# Patient Record
Sex: Female | Born: 1945
Health system: Southern US, Community
[De-identification: ages and names within clinical notes are randomized; demographics above are authoritative.]

## PROBLEM LIST (undated history)

## (undated) DIAGNOSIS — K760 Fatty (change of) liver, not elsewhere classified: Secondary | ICD-10-CM

## (undated) DIAGNOSIS — Z8719 Personal history of other diseases of the digestive system: Secondary | ICD-10-CM

## (undated) DIAGNOSIS — C801 Malignant (primary) neoplasm, unspecified: Secondary | ICD-10-CM

## (undated) DIAGNOSIS — I8390 Asymptomatic varicose veins of unspecified lower extremity: Secondary | ICD-10-CM

## (undated) DIAGNOSIS — Z8709 Personal history of other diseases of the respiratory system: Secondary | ICD-10-CM

## (undated) DIAGNOSIS — E119 Type 2 diabetes mellitus without complications: Secondary | ICD-10-CM

## (undated) DIAGNOSIS — I10 Essential (primary) hypertension: Secondary | ICD-10-CM

## (undated) DIAGNOSIS — F329 Major depressive disorder, single episode, unspecified: Secondary | ICD-10-CM

## (undated) DIAGNOSIS — K2270 Barrett's esophagus without dysplasia: Secondary | ICD-10-CM

## (undated) DIAGNOSIS — F419 Anxiety disorder, unspecified: Secondary | ICD-10-CM

## (undated) DIAGNOSIS — K219 Gastro-esophageal reflux disease without esophagitis: Secondary | ICD-10-CM

## (undated) DIAGNOSIS — I872 Venous insufficiency (chronic) (peripheral): Secondary | ICD-10-CM

## (undated) DIAGNOSIS — E785 Hyperlipidemia, unspecified: Secondary | ICD-10-CM

## (undated) DIAGNOSIS — T884XXA Failed or difficult intubation, initial encounter: Secondary | ICD-10-CM

## (undated) DIAGNOSIS — R32 Unspecified urinary incontinence: Secondary | ICD-10-CM

## (undated) DIAGNOSIS — N189 Chronic kidney disease, unspecified: Secondary | ICD-10-CM

## (undated) DIAGNOSIS — K581 Irritable bowel syndrome with constipation: Secondary | ICD-10-CM

## (undated) DIAGNOSIS — T7840XA Allergy, unspecified, initial encounter: Secondary | ICD-10-CM

## (undated) DIAGNOSIS — F32A Depression, unspecified: Secondary | ICD-10-CM

## (undated) DIAGNOSIS — M1990 Unspecified osteoarthritis, unspecified site: Secondary | ICD-10-CM

## (undated) DIAGNOSIS — G4730 Sleep apnea, unspecified: Secondary | ICD-10-CM

## (undated) DIAGNOSIS — K5790 Diverticulosis of intestine, part unspecified, without perforation or abscess without bleeding: Secondary | ICD-10-CM

## (undated) HISTORY — PX: CARDIAC CATHETERIZATION: SHX172

## (undated) HISTORY — DX: Fatty (change of) liver, not elsewhere classified: K76.0

## (undated) HISTORY — DX: Allergy, unspecified, initial encounter: T78.40XA

## (undated) HISTORY — DX: Asymptomatic varicose veins of unspecified lower extremity: I83.90

## (undated) HISTORY — PX: TONSILLECTOMY: SUR1361

## (undated) HISTORY — DX: Depression, unspecified: F32.A

## (undated) HISTORY — PX: CHOLECYSTECTOMY: SHX55

## (undated) HISTORY — DX: Major depressive disorder, single episode, unspecified: F32.9

## (undated) HISTORY — PX: ROTATOR CUFF REPAIR: SHX139

## (undated) HISTORY — DX: Unspecified osteoarthritis, unspecified site: M19.90

## (undated) HISTORY — PX: UPPER GI ENDOSCOPY: SHX6162

## (undated) HISTORY — DX: Hyperlipidemia, unspecified: E78.5

## (undated) HISTORY — DX: Type 2 diabetes mellitus without complications: E11.9

## (undated) HISTORY — PX: TUBAL LIGATION: SHX77

## (undated) HISTORY — DX: Essential (primary) hypertension: I10

## (undated) HISTORY — DX: Gastro-esophageal reflux disease without esophagitis: K21.9

## (undated) HISTORY — DX: Unspecified urinary incontinence: R32

## (undated) HISTORY — PX: ABDOMINAL HYSTERECTOMY: SHX81

---

## 2002-09-26 HISTORY — PX: JOINT REPLACEMENT: SHX530

## 2002-11-05 ENCOUNTER — Encounter: Admission: RE | Admit: 2002-11-05 | Discharge: 2003-02-03 | Payer: Self-pay | Admitting: Endocrinology

## 2003-02-25 ENCOUNTER — Encounter: Admission: RE | Admit: 2003-02-25 | Discharge: 2003-05-26 | Payer: Self-pay | Admitting: Endocrinology

## 2003-05-07 ENCOUNTER — Encounter (HOSPITAL_BASED_OUTPATIENT_CLINIC_OR_DEPARTMENT_OTHER): Admission: RE | Admit: 2003-05-07 | Discharge: 2003-06-03 | Payer: Self-pay | Admitting: Internal Medicine

## 2003-09-09 ENCOUNTER — Inpatient Hospital Stay (HOSPITAL_COMMUNITY): Admission: RE | Admit: 2003-09-09 | Discharge: 2003-09-13 | Payer: Self-pay | Admitting: Orthopedic Surgery

## 2003-09-26 ENCOUNTER — Encounter (HOSPITAL_BASED_OUTPATIENT_CLINIC_OR_DEPARTMENT_OTHER): Admission: RE | Admit: 2003-09-26 | Discharge: 2003-11-07 | Payer: Self-pay | Admitting: Internal Medicine

## 2003-12-08 ENCOUNTER — Encounter: Admission: RE | Admit: 2003-12-08 | Discharge: 2004-03-07 | Payer: Self-pay | Admitting: Cardiology

## 2004-01-23 ENCOUNTER — Encounter (HOSPITAL_BASED_OUTPATIENT_CLINIC_OR_DEPARTMENT_OTHER): Admission: RE | Admit: 2004-01-23 | Discharge: 2004-02-05 | Payer: Self-pay | Admitting: Internal Medicine

## 2004-10-05 ENCOUNTER — Ambulatory Visit: Payer: Self-pay | Admitting: Family Medicine

## 2005-02-14 ENCOUNTER — Ambulatory Visit: Payer: Self-pay | Admitting: Family Medicine

## 2005-09-26 HISTORY — PX: JOINT REPLACEMENT: SHX530

## 2005-09-29 ENCOUNTER — Inpatient Hospital Stay (HOSPITAL_COMMUNITY): Admission: RE | Admit: 2005-09-29 | Discharge: 2005-10-03 | Payer: Self-pay | Admitting: Orthopedic Surgery

## 2005-10-19 ENCOUNTER — Encounter: Payer: Self-pay | Admitting: Orthopedic Surgery

## 2005-10-21 ENCOUNTER — Ambulatory Visit: Payer: Self-pay | Admitting: Family Medicine

## 2005-10-25 ENCOUNTER — Ambulatory Visit: Payer: Self-pay | Admitting: Family Medicine

## 2005-10-27 ENCOUNTER — Encounter: Payer: Self-pay | Admitting: Orthopedic Surgery

## 2005-10-31 ENCOUNTER — Ambulatory Visit: Payer: Self-pay | Admitting: Family Medicine

## 2005-11-24 ENCOUNTER — Encounter: Payer: Self-pay | Admitting: Orthopedic Surgery

## 2006-01-20 ENCOUNTER — Encounter: Admission: RE | Admit: 2006-01-20 | Discharge: 2006-01-20 | Payer: Self-pay | Admitting: Orthopedic Surgery

## 2006-02-01 ENCOUNTER — Ambulatory Visit: Payer: Self-pay | Admitting: Family Medicine

## 2006-05-31 ENCOUNTER — Encounter: Payer: Self-pay | Admitting: Orthopedic Surgery

## 2006-06-12 ENCOUNTER — Ambulatory Visit: Payer: Self-pay | Admitting: Gastroenterology

## 2006-06-26 ENCOUNTER — Encounter: Payer: Self-pay | Admitting: Orthopedic Surgery

## 2006-07-27 ENCOUNTER — Encounter: Payer: Self-pay | Admitting: Orthopedic Surgery

## 2007-11-29 ENCOUNTER — Telehealth: Payer: Self-pay | Admitting: Family Medicine

## 2007-11-29 ENCOUNTER — Telehealth (INDEPENDENT_AMBULATORY_CARE_PROVIDER_SITE_OTHER): Payer: Self-pay | Admitting: *Deleted

## 2008-05-07 ENCOUNTER — Encounter: Payer: Self-pay | Admitting: Urology

## 2008-05-27 ENCOUNTER — Encounter: Payer: Self-pay | Admitting: Urology

## 2008-06-26 ENCOUNTER — Encounter: Payer: Self-pay | Admitting: Urology

## 2008-07-27 ENCOUNTER — Encounter: Payer: Self-pay | Admitting: Urology

## 2009-01-21 ENCOUNTER — Inpatient Hospital Stay (HOSPITAL_COMMUNITY): Admission: EM | Admit: 2009-01-21 | Discharge: 2009-01-24 | Payer: Self-pay | Admitting: Emergency Medicine

## 2009-01-29 ENCOUNTER — Encounter: Payer: Self-pay | Admitting: Critical Care Medicine

## 2009-01-29 ENCOUNTER — Ambulatory Visit (HOSPITAL_COMMUNITY): Admission: RE | Admit: 2009-01-29 | Discharge: 2009-01-29 | Payer: Self-pay | Admitting: Critical Care Medicine

## 2009-01-29 ENCOUNTER — Ambulatory Visit: Payer: Self-pay | Admitting: Surgery

## 2009-02-26 ENCOUNTER — Ambulatory Visit: Payer: Self-pay | Admitting: Vascular Surgery

## 2009-02-26 ENCOUNTER — Ambulatory Visit (HOSPITAL_COMMUNITY): Admission: RE | Admit: 2009-02-26 | Discharge: 2009-02-26 | Payer: Self-pay | Admitting: Critical Care Medicine

## 2009-02-26 ENCOUNTER — Encounter: Payer: Self-pay | Admitting: Critical Care Medicine

## 2010-03-04 ENCOUNTER — Emergency Department: Payer: Self-pay | Admitting: Emergency Medicine

## 2010-10-26 NOTE — Progress Notes (Signed)
Summary: med update  Medications Added LISINOPRIL 20 MG TABS (LISINOPRIL)  NEXIUM 40 MG CPDR (ESOMEPRAZOLE MAGNESIUM)  ACTOS 30 MG TABS (PIOGLITAZONE HCL)  HYDROCHLOROTHIAZIDE 25 MG TABS (HYDROCHLOROTHIAZIDE)  GLUCOTROL XL 10 MG TB24 (GLIPIZIDE)  ALLEGRA 180 MG TABS (FEXOFENADINE HCL)  FLONASE 50 MCG/ACT SUSP (FLUTICASONE PROPIONATE)  GLUCOPHAGE XR 500 MG TB24 (METFORMIN HCL)             New/Updated Medications: LISINOPRIL 20 MG TABS (LISINOPRIL)  NEXIUM 40 MG CPDR (ESOMEPRAZOLE MAGNESIUM)  ACTOS 30 MG TABS (PIOGLITAZONE HCL)  HYDROCHLOROTHIAZIDE 25 MG TABS (HYDROCHLOROTHIAZIDE)  GLUCOTROL XL 10 MG TB24 (GLIPIZIDE)  ALLEGRA 180 MG TABS (FEXOFENADINE HCL)  FLONASE 50 MCG/ACT SUSP (FLUTICASONE PROPIONATE)  GLUCOPHAGE XR 500 MG TB24 (METFORMIN HCL)

## 2010-10-26 NOTE — Progress Notes (Signed)
Summary: calan  Phone Note From Pharmacy   Call For: dr Marshawn Ninneman  Summary of Call: this pt has not seen you since 5/07, I have sent messages for the past 2 months to the pharmacists along with electronic refill responses that pt needs office visit before further refills but they have sent another electronic request for calan Initial call taken by: Lowella Petties,  November 29, 2007 4:35 PM  Follow-up for Phone Call        please call pt and get follow up appt scheduled then can refil 1 month with 1 refil Follow-up by: Judith Part MD,  November 29, 2007 5:01 PM  Additional Follow-up for Phone Call Additional follow up Details #1::        advised pharmacist that pt needs office visit and I cant get in touch with her to tell her because her phone doesnt work, pharmacist will advise her to call the office Additional Follow-up by: Lowella Petties,  November 29, 2007 5:09 PM

## 2011-01-04 LAB — GLUCOSE, CAPILLARY
Glucose-Capillary: 152 mg/dL — ABNORMAL HIGH (ref 70–99)
Glucose-Capillary: 172 mg/dL — ABNORMAL HIGH (ref 70–99)

## 2011-01-05 LAB — COMPREHENSIVE METABOLIC PANEL
ALT: 19 U/L (ref 0–35)
ALT: 20 U/L (ref 0–35)
AST: 21 U/L (ref 0–37)
AST: 22 U/L (ref 0–37)
Albumin: 3.3 g/dL — ABNORMAL LOW (ref 3.5–5.2)
Albumin: 3.7 g/dL (ref 3.5–5.2)
Alkaline Phosphatase: 65 U/L (ref 39–117)
Alkaline Phosphatase: 77 U/L (ref 39–117)
BUN: 12 mg/dL (ref 6–23)
BUN: 19 mg/dL (ref 6–23)
CO2: 26 mEq/L (ref 19–32)
CO2: 27 mEq/L (ref 19–32)
Calcium: 8.9 mg/dL (ref 8.4–10.5)
Calcium: 8.9 mg/dL (ref 8.4–10.5)
Chloride: 105 mEq/L (ref 96–112)
Chloride: 99 mEq/L (ref 96–112)
Creatinine, Ser: 0.8 mg/dL (ref 0.4–1.2)
Creatinine, Ser: 0.94 mg/dL (ref 0.4–1.2)
GFR calc Af Amer: >60 mL/min (ref >60)
GFR calc Af Amer: >60 mL/min (ref >60)
GFR calc non Af Amer: 60 mL/min — ABNORMAL LOW (ref >60)
GFR calc non Af Amer: >60 mL/min (ref >60)
Glucose, Bld: 151 mg/dL — ABNORMAL HIGH (ref 70–99)
Glucose, Bld: 189 mg/dL — ABNORMAL HIGH (ref 70–99)
Potassium: 3.7 mEq/L (ref 3.5–5.1)
Potassium: 3.7 mEq/L (ref 3.5–5.1)
Sodium: 134 mEq/L — ABNORMAL LOW (ref 135–145)
Sodium: 141 mEq/L (ref 135–145)
Total Bilirubin: 0.6 mg/dL (ref 0.3–1.2)
Total Bilirubin: 0.7 mg/dL (ref 0.3–1.2)
Total Protein: 6.3 g/dL (ref 6.0–8.3)
Total Protein: 7.1 g/dL (ref 6.0–8.3)

## 2011-01-05 LAB — CBC
HCT: 33.3 % — ABNORMAL LOW (ref 36.0–46.0)
HCT: 36.4 % (ref 36.0–46.0)
Hemoglobin: 11.6 g/dL — ABNORMAL LOW (ref 12.0–15.0)
Hemoglobin: 12.6 g/dL (ref 12.0–15.0)
MCHC: 34.8 g/dL (ref 30.0–36.0)
MCHC: 34.8 g/dL (ref 30.0–36.0)
MCV: 91 fL (ref 78.0–100.0)
MCV: 91.1 fL (ref 78.0–100.0)
Platelets: 225 10*3/uL (ref 150–400)
Platelets: 283 10*3/uL (ref 150–400)
RBC: 3.65 MIL/uL — ABNORMAL LOW (ref 3.87–5.11)
RBC: 4 MIL/uL (ref 3.87–5.11)
RDW: 14.6 % (ref 11.5–15.5)
RDW: 14.7 % (ref 11.5–15.5)
WBC: 3.7 10*3/uL — ABNORMAL LOW (ref 4.0–10.5)
WBC: 6.1 10*3/uL (ref 4.0–10.5)

## 2011-01-05 LAB — POCT I-STAT, CHEM 8
BUN: 31 mg/dL — ABNORMAL HIGH (ref 6–23)
Calcium, Ion: 1.17 mmol/L (ref 1.12–1.32)
Chloride: 101 mEq/L (ref 96–112)
Creatinine, Ser: 0.9 mg/dL (ref 0.4–1.2)
Glucose, Bld: 88 mg/dL (ref 70–99)
HCT: 39 % (ref 36.0–46.0)
Hemoglobin: 13.3 g/dL (ref 12.0–15.0)
Potassium: 3.8 mEq/L (ref 3.5–5.1)
Sodium: 136 mEq/L (ref 135–145)
TCO2: 28 mmol/L (ref 0–100)

## 2011-01-05 LAB — GLUCOSE, CAPILLARY
Glucose-Capillary: 101 mg/dL — ABNORMAL HIGH (ref 70–99)
Glucose-Capillary: 111 mg/dL — ABNORMAL HIGH (ref 70–99)
Glucose-Capillary: 119 mg/dL — ABNORMAL HIGH (ref 70–99)
Glucose-Capillary: 140 mg/dL — ABNORMAL HIGH (ref 70–99)
Glucose-Capillary: 144 mg/dL — ABNORMAL HIGH (ref 70–99)
Glucose-Capillary: 146 mg/dL — ABNORMAL HIGH (ref 70–99)
Glucose-Capillary: 150 mg/dL — ABNORMAL HIGH (ref 70–99)
Glucose-Capillary: 83 mg/dL (ref 70–99)

## 2011-01-05 LAB — PROTIME-INR
INR: 1.1 (ref 0.00–1.49)
Prothrombin Time: 14.1 seconds (ref 11.6–15.2)

## 2011-01-05 LAB — DIFFERENTIAL
Basophils Absolute: 0 10*3/uL (ref 0.0–0.1)
Basophils Relative: 0 % (ref 0–1)
Eosinophils Absolute: 0.1 10*3/uL (ref 0.0–0.7)
Eosinophils Relative: 2 % (ref 0–5)
Lymphocytes Relative: 18 % (ref 12–46)
Lymphs Abs: 1.1 10*3/uL (ref 0.7–4.0)
Monocytes Absolute: 0.4 10*3/uL (ref 0.1–1.0)
Monocytes Relative: 7 % (ref 3–12)
Neutro Abs: 4.4 10*3/uL (ref 1.7–7.7)
Neutrophils Relative %: 73 % (ref 43–77)

## 2011-01-05 LAB — CULTURE, BLOOD (ROUTINE X 2)
Culture: NO GROWTH
Culture: NO GROWTH

## 2011-01-05 LAB — HEMOGLOBIN A1C
Hgb A1c MFr Bld: 6.9 % — ABNORMAL HIGH (ref 4.6–6.1)
Mean Plasma Glucose: 151 mg/dL

## 2011-02-08 NOTE — Discharge Summary (Signed)
NAMESARALEE, BOLICK             ACCOUNT NO.:  [id number]   MEDICAL RECORD NO.:  [id number]          PATIENT TYPE:  INP   LOCATION:  5504                         FACILITY:  [facility]   PHYSICIAN:  Michelene Gardener, MD    DATE OF BIRTH:  May 28, 1946   DATE OF ADMISSION:  01/21/2009  DATE OF DISCHARGE:  01/24/2009                               DISCHARGE SUMMARY   DISCHARGE DIAGNOSES:  1. Right lower extremity cellulitis.  2. Diabetes mellitus type 2 with hemoglobin A1c 6.9 in this admission.  3. Hypertension.  4. Hyperlipidemia.  5. History of diverticulitis.  6. History of osteoarthritis with bilateral knee replacement.  7. Depression.  8. Seasonal allergies.   DISCHARGE MEDICATIONS:  1. Clindamycin 600 mg p.o. 3 times daily for 2 weeks.  2. Lasix 40 mg p.o. once daily as needed and that has been prescribed      by primary physician.  3. Hydrochlorothiazide 12.5 mg once a day.  4. Lipitor 20 mg once a day.  5. Glipizide ER 10 mg once a day.  6. Acid controller 20 mg p.o. once daily.  7. Levemir 180 mg once a day.  8. Meloxicam 7.5 mg once a day.  9. Lisinopril 20 mg once a day.  10.Flonase inhaler 1 spray in each nostril once a day.  11.ProAir inhaler 1 spray in each nostril once a day as needed.  12.Actos 30 mg p.o. once daily.  13.Metformin ER 1000 mg p.o. twice daily.  14.Astepro spray 1 spray inhaled twice daily.  15.Meclizine 25 mg p.o. q.8 h. as needed.  16.Sertraline 50 mg p.o. once a day.  17.Multivitamin 1 tab p.o. once daily.  18.Calcium plus vitamin D 600 mg p.o. twice daily.  19.Magnesium plus zinc 1 tablet twice daily.  20.Potassium chloride 10 mEq once a day.  21.Fish oil 1000 mg once a day.  22.Vitamin C 1 tablet p.o. once a day.   CONSULTATIONS:  None.   PROCEDURES:  None.   RADIOLOGY STUDIES:  1. Venous Doppler of lower extremities showed no evidence of DVT.  2. CT of right lower leg with contrast showed findings consistent with      cellulitis about  the right lower leg with no evidence of abscess,      myositis, or osteomyelitis.   FOLLOWUP:  With primary doctor within a week.   HOSPITAL COURSE:  This is a 65 year old female with past medical history  of multiple problems, presented to the hospital with right lower  extremity swelling and erythema.  This patient was diagnosed with  cellulitis as an outpatient.  She called her primary doctor before  coming to the hospital.  She was started on Keflex.  She did not have  any improvement with that and she came to the hospital for further  evaluation.  The patient was admitted to the hospital.  She had venous  ultrasound that came to be negative for DVT.  She had CT of her lower  extremity that showed cellulitis without evidence of abscess or  osteomyelitis.  The patient was started initially on ciprofloxacin plus  vancomycin and that  was discontinued and she was switched to Ancef.  The  patient improved during this hospitalization with improving redness and  tenderness.  At the time of discharge, she is feeling well, still has  some residual cellulitis.  She does not have any fever.  White count was  normal.  The patient will be discharged home today on clindamycin for 2  weeks and she was advised to follow up with her primary doctor to  reassess her cellulitis.  She was advised to come to the ER if she  developed any complications or her cellulitis worsened.  Otherwise,  other medical conditions remained stable during this hospitalization.  The patient was continued on all preadmission medications and she was  given 1 prescription for clindamycin.   Total assessment time is 40 minutes.      Michelene Gardener, MD  Electronically Signed     NAE/MEDQ  D:  01/24/2009  T:  01/25/2009  Job:  [id number]

## 2011-02-08 NOTE — H&P (Signed)
Sandra Hendrix, JOB             ACCOUNT NO.:  [id number]   MEDICAL RECORD NO.:  [id number]          PATIENT TYPE:  INP   LOCATION:  5504                         FACILITY:  [facility]   PHYSICIAN:  Darryl D. Prime, MD    DATE OF BIRTH:  [birth year]   DATE OF ADMISSION:  01/21/2009  DATE OF DISCHARGE:                              HISTORY & PHYSICAL   PRIMARY CARE PHYSICIAN:  Brooke Bonito, MD   CHIEF COMPLAINT:  Right lower extremity swelling and erythema.   HISTORY OF PRESENT ILLNESS:  The patient is a 65 year old white woman  with past medical history of hypertension, hyperlipidemia, diabetes type  2, and bilateral total knee replacements secondary to osteoarthritis  presenting with swelling/erythema of the right lower extremity.  She  noticed a reddish spot on the anterior leg near her ankle approximately  6 weeks ago, but she does not remember trauma/skin breakage/stings/bite.  She noticed more redness 4 days ago and saw her doctor 2 days ago and  she was started on Keflex.  Yesterday when she came back from work, she  noticed that the erythema extended.  She called Dr. Juleen China today and he  sent her to the ED for CT of her leg.  She also had flu-like symptoms  but not really fever and chills.  No nausea, vomiting, or other  distress, no increasing pain in the leg.   PAST MEDICAL HISTORY:  1. Diabetes type 2 for 12 years, non-insulin-dependent with a last      hemoglobin A1c of 6.9%.  2. Hypertension.  3. Hyperlipidemia.  4. History of diverticulitis.  5. History of osteoarthritis with bilateral total knee replacements,      left in 2005 and right in 2007.  6. Bunionectomy in big toes.  7. Gallbladder removal in 1980.  8. Tubal ligation in 1990.   MEDICATIONS AT HOME:  1. Furosemide 40 mg p.o. daily as needed for swelling.  2. HCTZ 12.5 mg p.o. daily.  3. Lipitor 20 mg p.o. daily.  4. Glipizide ER 10 mg p.o. daily.  5. Acid controller 20 mg p.o. daily.  6. Verapamil 180 mg  p.o. daily.  7. Meloxicam 7.5 mg p.o. daily.  8. Lisinopril 20 mg p.o. daily.  9. Flonase inhaled 1 spray in each nostril daily.  10.ProAir HFA inhaler 1 spray in each nostril daily as needed.  11.Actos 30 mg p.o. daily.  12.Metformin ER 1000 mg p.o. b.i.d.  13.Astepro spray 1 spray inhaled 2 times daily.  14.Meclizine 25 mg p.o. daily as needed.  15.Sertraline 50 mg p.o. daily.  16.Multivitamin 1 tablet p.o. daily.  17.Calcium plus vitamin D 600 mg p.o. b.i.d.  18.Magnesium plus zinc 1 tablet p.o. b.i.d.  19.Allergy medication 25 mg p.o. b.i.d.  20.Potassium (unknown mEq) p.o. daily.  21.Remifemin over-the-counter p.o. b.i.d. for hot flashes.  22.Fish oil 1000 mg p.o. daily.  23.Vitamin C 1 tablet p.o. daily.   ALLERGIES:  1. MORPHINE, nausea and vomiting.  2. CODEINE, nausea and vomiting.  3. TALWIN (blood thinner), nausea and vomiting.   SOCIAL HISTORY:  She is married, lives with her husband  in [place]  near [place].  She has 2 daughters, healthy.  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  She  never smoked.  She rarely drinks alcohol, not using drugs.   REVIEW OF SYSTEMS:  Per HPI.   PHYSICAL EXAMINATION:  VITAL SIGNS:  97.9, pulse 84, blood pressure  127/70, respiratory rate 18, and oxygen saturation 97% on room air.  GENERAL:  She is in no acute distress, pleasant.  PULMONARY:  Clear to auscultation bilaterally.  CARDIAC:  Regular rate and rhythm.  No murmurs, rubs, or gallops.  GI:  Soft, obese, nontender, and nondistended.  Bowel sounds decreased.  EXTREMITIES:  Right lower extremity erythematous near ankle, not  fluctuant, minimally painful to palpation, not warm to touch compared to  the other leg, and swelling at the site minimal.  Good pedal pulses.  Good ankle and toe movement.  No drainage, skin not broken.  Sensation  intact to light touch.  Toenails normal.  No skin breakage on soles.   LABORATORY DATA:   White blood count 6.1 with PMN percentage of 73,  hemoglobin 12.6, and platelets 283.  CT of the right lower extremity  with contrast shows cellulitis, more pronounced around the right ankle,  negative for abscess, myositis, or osteomyelitis.   ASSESSMENT AND PLAN:  1. Right lower extremity cellulitis.  Per CT of right lower leg,      negative for abscess, myositis, or osteomyelitis as mentioned      above.  There is no clear trigger.  The patient does not recall      breaking the skin, hitting her leg, or having any bite or sting.      She is diabetic, however (with good control per her report).  She      does not have fever and chills or high white count.  However, she      reports flu-like symptoms.  She was given vancomycin in the      emergency room and developed some erythema on the chest along with      itching which was relieved by Benadryl.  We will continue      vancomycin (but we will push slowly to avoid an allergic reaction)      to cover possible methicillin-resistant Staphylococcus aureus.  We      will also add ciprofloxacin for Pseudomonas coverage since she is a      diabetic.  We will add Tylenol p.r.n. pain/fever.  We will hold      diuretics and keep KVO for now.  However, she might need increase      IV fluid if develops fever.  We will check hemoglobin A1c and a      CMET and we will start the patient on sliding scale.  2. Diabetes mellitus type 2.  This appears to be well controlled.  We      will check a hemoglobin A1c and start her on sliding scale with no      basal coverage for now.  When preparing to discharge, we can      restart home p.o. meds.  3. Hypertension.  We will continue verapamil and lisinopril, but we      will hold diuretics to avoid dehydration in the setting of      infection.  4. Prophylaxis.  We will give her Protonix and heparin subcu.      Carlus Pavlov, M.D.  Electronically Signed  Darryl D. Prime, MD  Electronically  Signed    CG/MEDQ  D:  01/21/2009  T:  01/22/2009  Job:  [id number]   cc:   Brooke Bonito, M.D.

## 2011-02-11 NOTE — Op Note (Signed)
Sandra Hendrix, Sandra Hendrix             ACCOUNT NO.:  [id number]   MEDICAL RECORD NO.:  [id number]          PATIENT TYPE:  INP   LOCATION:  0001                         FACILITY:  [facility]   PHYSICIAN:  Marlowe Kays, M.D.  DATE OF BIRTH:  [birth year]   DATE OF PROCEDURE:  09/29/2005  DATE OF DISCHARGE:                                 OPERATIVE REPORT   PREOPERATIVE DIAGNOSIS:  Tricompartmental degenerative arthritis, right  knee.   POSTOP DIAGNOSIS:  Tricompartmental degenerative arthritis, right knee.   OPERATION:  Osteonics total knee replacement, right.   SURGEON:  Marlowe Kays, M.D.   ASSISTANT:  Mr. Adrian Blackwater.   ANESTHESIA.:  General.   PATHOLOGY AND JUSTIFICATION FOR PROCEDURE:  She has had a successful total  knee replacement left a number of years ago, was very happy with it. She had  a varus deformity in this knee as well and I used the 80 millimeter  extension on her tibia which has helped, I feel, to stabilize the knee and I  elected to use it in this knee as well. She also had a small flexion  contracture which I corrected for operatively as well.   PROCEDURE:  Prophylactic antibiotic, satisfactory spinal anesthesia, Foley  catheter inserted, pneumatic tourniquet, lateral hip stabilizer and Sure  Foot.  The right leg was prepped with DuraPrep from tourniquet to ankle and  draped in sterile field. Ioban employed. I esmarched out the leg sterilely.  A vertical midline incision down to the patellar mechanism median  parapatellar incision to open the joint.  Pes anserinus and medial  collateral ligament were undermined off the proximal tibia.  She had good  bit of synovitis of the knee which I resected with cautery and double-action  rongeurs.  Large osteophytes from femur and patella were removed.  The  patellar mechanism was freed up, the patella everted, the knee flexed. I  then excised the medial and lateral menisci and most the anterior cruciate  and posterior  cruciate ligaments.  I made a 5/16 inch drill hole the distal  femur followed by canal finder and the instrument for creating a 5 degrees  valgus cut to her right knee. I elected to take an additional 2 mm going  from 10 to 12 off the distal femur to help correct the flexion contracture.  We then used the gauge and I found a #9 distal femur would be the  appropriate size.  The scribe lines were placed to place the distal femoral  cutting jig and anterior and posterior cuts and posterior and anterior  chamferings were then made.  I then went to the tibia where remnants of  menisci and ACL and PCL were removed and I made a leveling cut of the tibia,  incised it at #9 and using the base plate made my initial intramedullary  drill hole followed by a step-cut drill and canal finder.  I then used  intramedullary rod with external guide to initially make a 12 mm cut off the  depressed medial tibial plateau. I then returned to femur where I removed  remnants of bone from the  posterior condyles and then placed the jig for  processing the distal femur first creating the groove for the patella and  then after using a micro saw to remove initial bone, the two instruments for  creating the aperture for the post to stabilize the post.  I then went  through a trial reduction and found that the knee was a little tight with a  10-mm spacer but an 8 worked well and accordingly went back to the tibia and  removed an additional 2 mm of bone.  While the knee was in extension, we  sized the patella at 26 and used the 10 mm recessed cutting jig to first  make a 10-mm recessed cut followed by the guide for creating three fixation  holes. Then placed the trial patella and trimmed up bone from around the  perimeter. Then returned to the tibia where with knee in extension, we  marked scribe lines on the tibia using the full components and using the  external rod splitting the bimalleolar distance as our marking guide  on the  anterior tibia.  I then placed a #9 baseplate stabilizing with three pins  and drilled for the tibial keel up to a 9 cemented.  Followed this by  reaming the distal femoral canal for the 80 mm extension.  I used a 14 mm  diameter on the left knee and this is what we went up to on the right knee  as well.  We then went through a trial reduction with the extension and  worked nicely with a trial reduction showing good position of the components  excellent motion and neutral extension with 10 mm spacer. Accordingly we  went ahead and water picked the knee while the methyl methacrylate was being  mixed and the 8 mm extension was placed on the distal tibia. The components  were then individually glued in. I placed methacrylate on the flare portion  of the tibial component as well as on the plateau, impacting the tibia and  removing excess methyl methacrylate, then glued in the femur, impacting it  and removing excess methacrylate and finally the patella, holding it with  the patellar holding clamp with the knee in extension until the methacrylate  had hardened. We then trimmed up bits methacrylate from around the  components and went through another trial reduction with 8 mm spacer which  worked well. Accordingly, this was the final component we used after  irrigating the wound well and checked to make sure there was no particles  methacrylate that were loose.  We then checked motion and stability which  were both excellent. No lateral release was required. Hemovac was placed and  we then closed in layers with two layers of #1 Vicryl over the quadriceps  mechanism after placing the patella in desired position and distally two  layers in the synovium and capsule 2-0 combination of #1 and 2-0 Vicryl  subcu tissue, staples in the skin. Tourniquet was released at one hour and  56 minutes tourniquet time. Betadine Adaptic dry sterile dressing followed by a knee immobilizer were applied. She  tolerated the procedure well and was  taken to recovery room in satisfactory condition with no known  complications. Essentially no blood loss, no blood replacement.           ______________________________  Marlowe Kays, M.D.     JA/MEDQ  D:  09/29/2005  T:  09/29/2005  Job:  [id number]

## 2011-02-11 NOTE — Op Note (Signed)
Sandra Hendrix, PFARR NO.:  [id number]   MEDICAL RECORD NO.:  [id number]                   PATIENT TYPE:  INP   LOCATION:  0004                                 FACILITY:  [facility]   PHYSICIAN:  Marlowe Kays, M.D.               DATE OF BIRTH:  1946/04/22   DATE OF PROCEDURE:  09/09/2003  DATE OF DISCHARGE:                                 OPERATIVE REPORT   PREOPERATIVE DIAGNOSIS:  Severe degenerative arthritis, left knee, status  post anterior cruciate ligament reconstruction.   POSTOPERATIVE DIAGNOSIS:  Severe degenerative arthritis, left knee, status  post anterior cruciate ligament reconstruction.   OPERATION/PROCEDURE:  Complex primary Osteonics knee replacement, left.   SURGEON:  Marlowe Kays, M.D.   ASSISTANT:  Georges Lynch. Darrelyn Hillock, M.D.   ANESTHESIA:  Spinal.   PATHOLOGY AND JUSTIFICATION FOR THE PROCEDURE:  She had history of ACL  reconstruction on the left but has had progressive deformity and pain in the  knee.  There is right flexion contracture, severe tricompartmental arthritis  and medial subluxation of the femur on the tibia.  My concern was that at  age 65 with these significant deformities and instability that tibial  loosening was a potential complication if we use the routine tibia  component.  Consequently we elected to use the 80 mm long x 14 mm wide  revision tibial component with the other components as listed below.   DESCRIPTION OF PROCEDURE:  Prophylactic antibiotics.  Spinal anesthesia.  Foley catheter inserted.  Pneumatic tourniquet.  Sure-Foot and lateral hip  positioner.  Left knee was prepped with DuraPrep, draped in the sterile  field.  I marked out the previous ACL incision.  Collier Ehmann was then employed and  I incorporated this incision in the surgical incision for this particular  case.  Utilizing essentially a central incision but incorporating this  portion of the ACL reconstruction incision, I then used a  median  parapatellar incision to open the joint.  She had severe arthritic spurring  about the patella, femur and tibia.  Internal mechanism freed up and everted  with the knee flexed, remnants of the menisci and the ACL and posterior  cruciate ligaments were removed.  Because of the instability and __________  of the femur on the tibia and tightness posteriorly with flexion and  contracture, I felt that the posterior cruciate sacrificing modality would  be appropriate here and also because of potential for tibial component  loosening, we both felt that using the revision stem 80 mm in length would  be better suited, particularly with her age of 65.  With all this in mind,  we then proceeded with the usual Scorpio protocol, made a 5/16th inch drill  hole in the distal femur followed by the canal finder and then the aligning  rod set for 5-degree cut and also elected to take 12 mm of cut off the  distal femur.  I  then treated the debridement of the internal knee  structures and made a leveling cut on the tibia.  Initially we measured the  proximal tibia at size 7and subsequently we went to a 9.  Because of the  depressed medial tibial plateau, I elected to take 2 mm off the lower medial  side.  After making my initial intramedullary drill hole and placing the  intramedullary guide, we made a 0-degree cut which proved to be sufficient.  On the femur we continued with the femoral resection sizing the femur at a  #7 and then placing the distal femoral cutting jig to make anterior and  posterior cuts and posterior and anterior chamferings.  We were not able to  see the interference screw which was buried deep in the femur and felt like  it would be too destructive to go after, but making the tibia cut, it was  apparent on the tibial side we were able to remove it.  With a bone hook, we  then removed bone posteriorly from around the femoral components.  I then  used the guide for making the  patellar groove.  I then used the micro saw to  remove some bone from the intercondylar notch and we then used the two  guides for creating the post hole.  Then went through a trial reduction and  found that adequate bone had been resected to allow either an 18 or 21 mm  spacer.  We also used the extramedullary rod at this point, splitting 5 mm  distance to mark scribe lines on the anterior tibia and measured it at a  size 9.  While the knee was in extension and the patella everted, we used  the 10 mm recessed cutting guide to make a 10 mm recessed, 26 mm in diameter  cut.  The jig for creating the three fixation holes was then placed, holes  made and then placed a trial prosthesis, trimming up bone around the  perimeter.  We then returned to the tibia where we reamed up to a size 14  distal to a depth of 80 mm.  We then used the revision guide on the tibia  and it was best stable at 0 degrees which was what we would have intuitively  thought based on our initial positioning.  The base plate was then placed  and we reamed for the rod with a 15 mm proximally and then up to a 9  cemented.  Having completed the preparation, we then put together the tibial  component, adding the 80 mm stem to the base plate.  The wound was irrigated  with the pulse irrigator, dried and methyl methacrylate, which was then  mixed and applied with a gun, gluing in individual components starting first  with the tibia with most of the glue being both on the proximal portion of  the tibia and in the proximal stem.  After impacting this and removing  excess methyl methacrylate, we then glued in the femur in the same way and  held the knee in extension with an 18 mm spacer while we glued in the  patella and held it with a patellar clamp, again removing excess  methacrylate.  When the glue had hardened, we checked and removed a few  small particles.  At this point we had reached two hours of tourniquet time at 300 mmHg  and I released the tourniquet and a few minor bleeders were  coagulated.  We checked for the  spacer and found that 18 mm posterior  stabilized spacer was the size of choice.  The final insert was placed, the  knee reduced and found to be nice and stable.  We did have to perform a  lateral release.  Hemovac was then placed and the knee closed with  interrupted #1 Vicryl in two layers in the quadriceps tendon and two layers  distally with one in the synovium and one in the capsule.  Subcutaneous  tissue was closed with a combination of #1 Vicryl and 2-0 Vicryl and the  skin with staples.  Betadine Adaptic and dry sterile dressing were applied  followed by knee immobilizer.  She was taken to the recovery room in  satisfactory condition with no known complications.  Estimated blood loss  was approximately 200 mL.  No blood replacement.  September 08, 2003                                               Marlowe Kays, M.D.    JA/MEDQ  D:  09/09/2003  T:  09/09/2003  Job:  [id number]

## 2011-02-11 NOTE — Discharge Summary (Signed)
NAMESHARRON, PETRUSKA NO.:  [id number]   MEDICAL RECORD NO.:  [id number]          PATIENT TYPE:  INP   LOCATION:  1518                         FACILITY:  [facility]   PHYSICIAN:  Marlowe Kays, M.D.  DATE OF BIRTH:  [birth year]   DATE OF ADMISSION:  09/29/2005  DATE OF DISCHARGE:  10/03/2005                                 DISCHARGE SUMMARY   ADMISSION DIAGNOSES:  1.  Severe and progressive osteoarthritis of the right knee.  2.  Hypertension.  3.  Diverticulitis.  4.  Type 2 diabetes.  5.  Multiple analgesic allergies.   DISCHARGE DIAGNOSES:  1.  Severe and progressive osteoarthritis of the right knee.  2.  Hypertension.  3.  Diverticulitis.  4.  Type 2 diabetes.  5.  Multiple analgesic allergies.  6.  Postoperative anemia, treated with transfusion.   OPERATION:  On September 29, 2005, the patient underwent Osteonics total knee  replacement arthroplasty of the right knee.   ASSISTANT:  Dooley L. Idolina Primer, PA-C.   CONSULTS:  None.   BRIEF HISTORY:  This 65 year old lady who successfully has undergone a left  total knee replacement arthroplasty, has been developing increasing problems  of the right knee.  She is a very active lady in relatively good health and  finds that her right knee is markedly interfering with her day-to-day  activities.  Since she has done so well with her left knee, she highly  desires to have similar results and after much consideration, including the  risks and benefits of surgery and x-ray findings showing deterioration of  the joint, it was decided to schedule her for the above procedure.   HOSPITAL COURSE:  The patient tolerated the surgical procedure quite well  and was placed on Coumadin protocol postoperatively for the prevention of  DVT and will continue so for four weeks after the date of surgery.  She  progressive very nicely with her rehabilitation in the hospital, achieving  60 degrees of flexion on a CPM machine.  Her  wound remained dry  postoperatively without evidence of any infection.  Neurovascular remained  intact to the right lower extremity.  Calf remained soft.  She had a drop in  her hemoglobin postoperatively to 8.1.  After consult by Dr. Noel Gerold, who was  on the weekend call, she approved transfusion, she was transfused with 2  units of PRBCs.  The hemoglobin was pending at the time of this dictation.   We had somewhat difficulty finding an analgesic that did not cause nausea in  this patient.  Demerol tabs caused nausea, and we eventually went back to  the Talwin. Even though she professed an allergy to it, she tolerated it  quite well and did control her pain.  We will send her home with some  Phenergan for that, should she have any nausea from that.   Laboratory values in the hospital hematologically showed a CBC  preoperatively completely within normal limits.  Again, final hemoglobin and  hematocrit are not available at this discharge.  Urinalysis negative for  urinary tract infection.  Blood chemistries remained normal, other than  a  slight drop in potassium at 3.1 postoperatively.  On October 02, 2005, her  potassium was 3.5.  Her sodium was 138.   No chest x-ray is seen on this chart.   Electrocardiogram showed a normal sinus rhythm.   CONDITION ON DISCHARGE:  Improved, stable.   PLAN:  The patient is discharged home.  She is to continue weightbearing as  tolerated with home health.  Return to see [place] in two weeks after the date of  surgery.  Use dry dressing to the knee as necessary.  Call if any problems.  Follow up with Dr. Milinda Antis per her instructions.  Prescriptions for Talwin NX  #50 for pain, Robaxin 500 mg for muscle spasms, and Phenergan tabs for  nausea are given along with the Coumadin prescription to be filled by  pharmacy and to be monitored by home health pharmacy.      Dooley L. Cherlynn June.    ______________________________  Marlowe Kays, M.D.     DLU/MEDQ  D:  10/03/2005  T:  10/03/2005  Job:  [id number]   cc:   Marne A. Tower, M.D. [facility]  [address] [place]  [place] [address]

## 2011-02-11 NOTE — Discharge Summary (Signed)
NAMEALAZNE, Sandra Hendrix   MEDICAL RECORD NO.:  [id number]                   PATIENT TYPE:  INP   LOCATION:  0483                                 FACILITY:  [facility]   PHYSICIAN:  Sandra Hendrix, M.D.               DATE OF BIRTH:  [birth year]   DATE OF ADMISSION:  09/09/2003  DATE OF DISCHARGE:  09/13/2003                                 DISCHARGE SUMMARY   ADMISSION DIAGNOSES:  1. Severe osteoarthritis of the left knee.  2. Non-insulin dependent diabetes mellitus.  3. hyperlipidemia  4. Hypertension.  5. Diverticulitis.  6. Allergic asthma.   DISCHARGE DIAGNOSES:  1. Severe osteoarthritis of the left knee.  2. Non-insulin dependent diabetes mellitus.  3. hyperlipidemia  4. Hypertension.  5. Diverticulitis.  6. Allergic asthma.  7. Postoperative anemia (treated).  8. Urinary tract infection (preoperatively treated).   OPERATION:  On September 09, 2003, the patient underwent complex primary  Osteonics knee replacement to the left knee.  Dr. Ranee Gosselin assisted.   CONSULTATIONS:  None.   HISTORY:  This 65 year old female has had progressive problems concerning  the left knee.  She developed a varus deformity, has interference with her  daily activities due to pain and deformity.  She has developed crepitus with  range of motion with the knee, and x-rays showed a medial shift of the femur  on the tibia with severe degenerative changes.  After much discussion of the  complications and side effects of surgery, it was decided to go ahead with  the above procedure.   HOSPITAL COURSE:  The patient tolerated the surgical procedure quite well.  We held the CPM machine due to her major discomfort.  On the first  postoperative day we began with gentle range of motion of the CPM machine.  The patient developed a postoperative anemia with a hematocrit and  hematocrit of 7.7/22.8.  Her face was pale, she was somewhat woozy when  she was  up and about.  We discussed with the patient and she agreed to  receive transfusion of 2 units of PRBCs, O positive blood.  O2 was given to  the patient while she was receiving the blood.  The hemoglobin came up to 10  with a hematocrit of 29.5 after transfusion.  She was more alert and  participated with physical therapy most vigorously.   The patient was placed on Coumadin protocol postoperatively for the  prevention of DVT.  Home equipment was supplied;  a rolling walked with  wheels, 3-in-1 commode.  She was doing well after her blood transfusion.  She received her final physical therapy, including stair training.  It was  felt she could be maintained in her home environment with home health.  She  is to return in two weeks after surgery.   LABORATORY DATA:  CBC preoperatively completely within normal limits.  Hemoglobin was 12.4, hematocrit was 36.9.  Hemoglobin  did drop to 7.7  postoperatively with a hematocrit of 22.8.  She was transfused and her  hemoglobin came back to 10, hematocrit was 29.5.  Blood chemistries stayed  normal.  Urinalysis preoperatively showed a mild urinary tract infection.  When repeated again on September 12, 2003, it was negative.  Electrocardiogram showed normal sinus rhythm, normal ECG.  Chest x-ray  showed lack of disease.   CONDITION ON DISCHARGE:  Improved and stable.   PLAN:  The patient is discharged to her home.  She is to have home health,  continue with total knee protocol and Coumadin protocol per pharmacy.  Dr.  Simonne Hendrix discharged the patient, wrote a prescription for Robaxin 500 mg  #30 one q.6h. p.r.n. muscle spasm, Demerol 50 mg #30 one q.4-6h. p.r.n.  pain.  She is to use dry dressing p.r.n.  Return to see Sandra Hendrix about  two weeks after the date of surgery, she will call for an appointment.  Follow up with her medical doctor concerning any medical problems, and  continue with home medications and diet.     Sandra Hendrix.                 Sandra Hendrix, M.D.    DLU/MEDQ  D:  09/29/2003  T:  09/29/2003  Job:  [id number]

## 2011-02-11 NOTE — Consult Note (Signed)
Sandra Hendrix, Sandra Hendrix NO.:  [id number]   MEDICAL RECORD NO.:  [id number]                   PATIENT TYPE:  REC   LOCATION:  FOOT                                 FACILITY:  [facility]   PHYSICIAN:  Jonelle Sports. Sevier, M.D.              DATE OF BIRTH:  June 07, 1946   DATE OF CONSULTATION:  05/08/2003  DATE OF DISCHARGE:                                   CONSULTATION   HISTORY:  This 65 year old white female was referred through the courtesy of  Dr. Simonne Come with assistance in healing of a chronic ulceration in the  right medial malleolar area.  Apparently, Dr. Simonne Come plans knee surgery  in the near future and would like all surface wounds healed if possible.   The patient does have type 2 diabetes, but this is ostensibly in good  control with a recent hemoglobin A1C of 6.2%.  Equally interesting, is the  fact that the family history is studded with connective tissue disorders  with her mother having died of lupus with renal complications and a cousin  having died of scleroderma.   With that background and history, the patient has noted areas of serpiginous  discoloration, but, no frank ulceration in the right medial malleolar area  for a number of years.  Only recently, has the margins of one of these areas  ulcerated, giving a tiny lesion of several millimeters in dimension.  She  has treated this with the use of Elidel and peroxide cleansing.  It is noted  that she thinks she has weak ankles secondary to lots of athletic activity  during adolescence, and consequently, wears the elastic ankle supports.  She  does wear 8 to 15 mm support hose to the knees bilaterally.   Because of the failure to heal and the interest in healing this quickly, Dr.  Simonne Come has referred her here for our consultation.   PAST MEDICAL HISTORY:  1. Hypertension.  2. Hypercholesterolemia.  3. Diabetes.  4. Severe degenerative arthritis, particularly in the knees.  5. Season  allergies.  6. GERD.  7. Diverticulosis.   ALLERGIES:  She was said to be allergic to MORPHINE and CODEINE.   MEDICATIONS:  Glucotrol XL, Glucophage, Lotrel, Vioxx, hydrochlorothiazide,  Flonase, Lipitor, Actos, Advair Diskus, ________ (which I assume is an  estrogen preparation), Tarka, multivitamins, Cosamin, hyoscyamine,  guaifenesin, calcium.   PHYSICAL EXAMINATION:  EXTREMITIES:  Examination today is limited to the  distal lower extremities.  The patient does have evidence of hypertrophic  arthritis of the knees with scar of some previous surgery on the left and  also has mild chronic venous insufficiency with visible varicosities,  particularly in the right lower extremity.  There is no significant edema at  the moment.  There is no significant foot or ankle deformity.   Skin temperatures are normal and essentially symmetrical.  All pulses are  palpable and adequate.  No telangiectasias on  the toes.  The patient has  broad foot with some relative loss of longitudinal metatarsal arch as well  and some limited callus formation at the first metatarsal head areas  bilaterally.  Monofilament testing shows  that protective sensation is  present throughout.   On the medial aspect of the right ankle at the malleolar area are two areas  of rather serpiginous chronic scarring of the skin, at the margin of one of  which of these is an open ulcer measuring 4 x 3 mm and quite superficial.   IMPRESSION:  Malleolar ulcer, likely on the basis of venous insufficiency  but with suspicion of a chronic underlying vasculitis as well.   DISPOSITION:  1. The patient was given instruction regarding foot care and diabetes by     video with limited nursing enforcement.  2. It is discussed with the patient that she may well have some variant of     connective tissue disease which is contributory to this lesion.  3. The area will be treated with Iodosorb, and she will be placed in a     Proform wrap  to that extremity.  4. Ultimate plan will be to place her in either 20-30 or 30-40 compression     hose once this is healed.  5. She is advised to obtain shoes of greater length and width in that those     she brings with her today are clearly inadequate in both parameters.  6. Followup visit will be here in one week.                                               Jonelle Sports. Cheryll Cockayne, M.D.    RES/MEDQ  D:  05/08/2003  T:  05/09/2003  Job:  [id number]   cc:   Marlowe Kays, M.D.  [address]  [place]  [place] [address]  Fax: [phone]   Brooke Bonito, M.D.  [address]  [place]  [place] [address]  Fax: [phone]   Idamae Schuller A. Milinda Antis, M.D. [facility]

## 2011-02-11 NOTE — H&P (Signed)
Sandra Hendrix, Sandra Hendrix NO.:  [id number]   MEDICAL RECORD NO.:  [id number]          PATIENT TYPE:  INP   LOCATION:  NA                           FACILITY:  [facility]   PHYSICIAN:  Marlowe Kays, M.D.  DATE OF BIRTH:  1946-08-13   DATE OF ADMISSION:  09/29/2005  DATE OF DISCHARGE:                                HISTORY & PHYSICAL   CHIEF COMPLAINT:  Pain in my right knee.   HISTORY OF PRESENT ILLNESS:  A 65 year old lady who has successfully  undergone left total knee replacement arthroplasty in the past now is having  increasing problems into the right knee.  It is interfering with her day-to-  day activities.  She is a very active lady and now is in relatively good  health and highly desires to have some reduction of her pain.  The x-rays  have shown severe degenerative changes with joint collapse of the right  knee.  After much discussion, including risks and benefits of surgery as  well as the fact that conservative measures, including Synvisc injections  have failed, it is felt she would benefit from surgical intervention and be  admitted for total knee replacement arthroplasty of the right knee.   She received home health after her left total knee replacement arthroplasty,  then had outpatient physical therapy.  This worked very well with her, and  she would like to have the same.   PAST MEDICAL HISTORY:  She has been in relatively good health.  Dr. Roxy Manns of [facility] is her medical doctor.   She is allergic to MORPHINE, CODEINE, and TALWIN.  She is unsure whether she  is allergic to Percocet or Vicodin.   CURRENT MEDICATIONS:  1.  Hydrochlorothiazide 25 mg 1/2 tab daily.  2.  Lipitor 20 mg 1 daily.  3.  Nexium 40 mg 1 daily.  4.  Glipizide ER 10 mg 1 daily.  5.  Verapamil 80 mg SR daily.  6.  Fexofenadine 180 mg daily.  7.  Mobic 7.5 mg 1 daily (will stop prior to surgery).  8.  Lisinopril 20 mg daily.  9.  Fluticasone (Flonase) 50 mg  2 sprays in each nose daily.  10. Actos 30 mg daily.  11. Metformin ER 500 mg 2 in the morning and 2 in the evening.  12. Meclizine 25 mg 1-4 times a day p.r.n.  13. Magnesium and zinc 2 daily.  14. Hyoscyamine 0.125 mg every 6 hours p.r.n.  15. __________ 2 daily.  16. Vitamin C 1 daily.  17. Glucosamine chondroitin 1 in the morning and 1 in the evening.  18. Century vitamins for adults.  19. Potassium 550 mg daily.  20. Calcium 600 mg 1 in the morning and 1 in the evening.  21. Fish oil 1000 mg daily (will stop prior to surgery).  22. Enteric-coated aspirin 81 mg 1 daily (will stop prior to surgery).   Dr. Milinda Antis is treating this patient for hypertension, diverticulitis, and  type 2 diabetes.   Past surgeries include left knee surgery with scopes in 1991, 1993, 1995,  and 1997 with  a total knee replacement done in 2004.  She had a left  bunionectomy and plantar wart removed in 2006 and a cholecystectomy in 1981.   FAMILY HISTORY:  Positive for hypertension, diabetes, cancer.   SOCIAL HISTORY:  The patient is married for 40 years.  She is an Electronics engineer.  Has no intake of tobacco products.  Occasional intake of  ETOH.  She has two grown daughters.  Her husband and her children will be  caregivers after surgery.   REVIEW OF SYSTEMS:  CNS:  No seizures or paralysis.  No numbness or double  vision.  RESPIRATORY:  No productive cough.  No hemoptysis.  No shortness of  breath.  CARDIOVASCULAR:  No chest pain.  No angina.  No orthopnea.  GASTROINTESTINAL:  No nausea, vomiting, melena, or bloody stools.  GENITOURINARY:  Some nocturia but no dysuria, or hematuria.  MUSCULOSKELETAL:  Primarily in the present illness with her right knee.   PHYSICAL EXAMINATION:  GENERAL/VITAL SIGNS:  An alert, cooperative, friendly  65 year old lady whose vital signs are blood pressure 110/64, pulse 80,  respirations 20.  HEENT:  Normocephalic.  PERRLA.  EOMs are intact.  Oropharynx is clear.   CHEST:  Clear to auscultation.  No rales or rhonchi.  HEART:  Regular rate and rhythm.  No murmurs are heard.  ABDOMEN:  Slightly obese.  Soft and nontender.  Liver and spleen not felt.  GENITALIA:/RECTAL/PELVIS:  Not done.  Not pertinent for the previous  illness.  EXTREMITIES:  Patient has an excellent range of motion of the left knee.  Right range of motion with crepitus.  Some mild valgus deformity and pain on  deep palpation.   ADMITTING DIAGNOSES:  1.  Severe end-stage osteoarthritis of the right knee.  2.  Hypertension.  3.  Diverticulitis.  4.  Type 2 diabetes.   PLAN:  Patient will be admitted for total knee replacement arthroplasty of  the right knee.  She will have home health after her hospitalization and  then outpatient physical therapy.  All questions were answered today.      Dooley L. Cherlynn June.    ______________________________  Marlowe Kays, M.D.    DLU/MEDQ  D:  09/21/2005  T:  09/21/2005  Job:  [id number]   cc:   Marne A. Tower, M.D. [facility]  [address] [place]  [place] [address]

## 2011-02-11 NOTE — H&P (Signed)
Sandra Hendrix, Sandra Hendrix NO.:  [id number]   MEDICAL RECORD NO.:  [id number]                   PATIENT TYPE:  INP   LOCATION:  NA                                   FACILITY:  [facility]   PHYSICIAN:  Marlowe Kays, M.D.               DATE OF BIRTH:  1946-07-11   DATE OF ADMISSION:  08/27/2003  DATE OF DISCHARGE:                                HISTORY & PHYSICAL   CHIEF COMPLAINT:  Pain in my left knee.   HISTORY OF PRESENT ILLNESS:  This 65 year old female, seen by Dr. Simonne Come  for progressive problems concerning her left knee.  She has developed a  varus deformity, has difficulty getting about, unable to do her day-to-day  activities.  Fortunately, she has been able to continue work, but she has  extreme difficulty.  She has crepitus with range of motion and considerable  bowing of the left knee, compared to the right.  X-rays have shown a  medial  shift of the femur on the tibia with degenerative changes.  This is a very  active lady and due to the progressive deformity, it is felt should would  benefit with surgical intervention.  After discussing the complications and  side effects of surgery, it was agreed to go along with a total knee  replacement arthroplasty left knee.   PAST MEDICAL HISTORY:  The patient has been in relatively good health.  1. She has hypertension.  2. Diverticulitis.  3. Non-insulin-dependent diabetes mellitus.  4. Now in menopause.  5. She has recently been treated for an allergic rhinitis/bronchitis with a     Z-Pak and Allegra.   MEDICATIONS:  1. Metformin 50 mg XR four a day.  2. Glipizide ER 10 mg one every day.  3. Actos 30 mg one every day.  4. Tarka 2-180 CR one every day.  5. Lipitor 10 mg one every day.  6. Celebrex 200 mg one every day.  7. Calcium 600 mg two a day.  8. She also takes multivitamins, Cosamine DS, RemiFemin.  9. Inhalers are Advair Diskus 100/50 one puff twice a day.  10.      Flonase 0.05%.  11.      Hydrochlorothiazide p.r.n.  12.      Meclizine p.r.n. dizziness.  13.      Hyoscyamine p.r.n. for her stomach.   Dr. Idamae Schuller A. Tower, at the [facility] at [facility] is her regular  doctor.  Dr. Brooke Bonito handles her diabetes.  Dr. Daleen Squibb is her cardiologist.   ALLERGIES:  1. MORPHINE.  2. CODEINE.   PAST SURGICAL HISTORY:  1. Multiple surgeries to the left knee.  2. Cholecystectomy.  3. Tubal ligation.   FAMILY HISTORY:  Positive for hypertension in the mother, sister and  brother, diabetes, cancer and her mother has lupus.   SOCIAL HISTORY:  She is married.  She is a Scientist, physiological.  Has  no intake of  alcohol, tobacco products.  Has two children.  She plans to have them help  her and has made arrangements along with home health through [facility].   REVIEW OF SYSTEMS:  CNS:  No seizure disorder or paralysis, numbness, or  double vision.  RESPIRATORY:  No productive cough, no hemoptysis, no  shortness of breath.  CARDIOVASCULAR:  No chest pain, no angina, no  orthopnea.  GASTROINTESTINAL:  No nausea or vomiting.  GENITOURINARY:  No  discharge, dysuria, hematuria.  MUSCULOSKELETAL:  Primarily in the present  illness with her knee.   PHYSICAL EXAMINATION:  GENERAL:  She is an alert and cooperative, friendly,  somewhat apprehensive 65 year old female.  VITAL SIGNS:  Blood pressure 124/68, py 72, respirations 12.  HEENT:  Normocephalic.  PERRLA.  EOMs intact.  Oropharynx is clear.  CHEST:  Clear to auscultation.  No rhonchi, no rales.  HEART:  Regular, rate and rhythm.  No murmurs are heard.  ABDOMEN:  Soft, nontender.  Liver and spleen not felt.  GENITALIA:  Not done, not pertinent to present illness.  RECTAL:  Not done, not pertinent to present illness.  PELVIC:  Not done, not pertinent to present illness.  BREASTS:  Not done, not pertinent to present illness.  EXTREMITIES:  Left knee as in present illness above.   ADMITTING DIAGNOSES:  1. Severe osteoarthritis of  the left knee.  2. Noninsulin-dependent diabetes mellitus.  3. Hyperlipidemia.  4. Hypertension.  5. Diverticulitis.  6. Allergic asthma.   PLAN:  The patient will undergo total knee replacement arthroplasty of the  left knee.  Plan home physical therapy with Genevieve Norlander.  Coumadin protocol x4  weeks after surgery.     Dooley L. Cherlynn June.                 Marlowe Kays, M.D.    DLU/MEDQ  D:  08/27/2003  T:  08/27/2003  Job:  [id number]   cc:   Brooke Bonito, M.D.  [address]  [place]  [place] [address]  Fax: [phone]   Idamae Schuller A. Milinda Antis, M.D. [facility]

## 2011-06-20 ENCOUNTER — Ambulatory Visit: Payer: Self-pay | Admitting: Gastroenterology

## 2011-07-19 ENCOUNTER — Ambulatory Visit: Payer: Self-pay | Admitting: Gastroenterology

## 2011-07-19 LAB — HM COLONOSCOPY: HM Colonoscopy: NORMAL

## 2011-07-22 LAB — PATHOLOGY REPORT

## 2012-03-05 LAB — HM MAMMOGRAPHY: HM Mammogram: NORMAL

## 2012-03-05 LAB — HM PAP SMEAR: HM Pap smear: NORMAL

## 2012-04-04 LAB — HM DIABETES EYE EXAM: HM Diabetic Eye Exam: NORMAL

## 2012-07-03 ENCOUNTER — Ambulatory Visit: Payer: Self-pay | Admitting: Gastroenterology

## 2012-07-04 LAB — PATHOLOGY REPORT

## 2012-11-05 ENCOUNTER — Encounter: Payer: Self-pay | Admitting: Internal Medicine

## 2012-11-05 ENCOUNTER — Ambulatory Visit (INDEPENDENT_AMBULATORY_CARE_PROVIDER_SITE_OTHER): Payer: Medicare PPO | Admitting: Internal Medicine

## 2012-11-05 VITALS — BP 132/86 | HR 65 | Temp 97.5°F | Resp 16 | Ht 61.0 in | Wt 172.5 lb

## 2012-11-05 DIAGNOSIS — K2270 Barrett's esophagus without dysplasia: Secondary | ICD-10-CM

## 2012-11-05 DIAGNOSIS — K449 Diaphragmatic hernia without obstruction or gangrene: Secondary | ICD-10-CM

## 2012-11-05 DIAGNOSIS — E1142 Type 2 diabetes mellitus with diabetic polyneuropathy: Secondary | ICD-10-CM

## 2012-11-05 DIAGNOSIS — R600 Localized edema: Secondary | ICD-10-CM

## 2012-11-05 DIAGNOSIS — R6882 Decreased libido: Secondary | ICD-10-CM

## 2012-11-05 DIAGNOSIS — F520 Hypoactive sexual desire disorder: Secondary | ICD-10-CM

## 2012-11-05 DIAGNOSIS — E1122 Type 2 diabetes mellitus with diabetic chronic kidney disease: Secondary | ICD-10-CM | POA: Insufficient documentation

## 2012-11-05 DIAGNOSIS — I10 Essential (primary) hypertension: Secondary | ICD-10-CM

## 2012-11-05 DIAGNOSIS — E1129 Type 2 diabetes mellitus with other diabetic kidney complication: Secondary | ICD-10-CM | POA: Insufficient documentation

## 2012-11-05 DIAGNOSIS — R5381 Other malaise: Secondary | ICD-10-CM

## 2012-11-05 DIAGNOSIS — E785 Hyperlipidemia, unspecified: Secondary | ICD-10-CM | POA: Insufficient documentation

## 2012-11-05 DIAGNOSIS — R609 Edema, unspecified: Secondary | ICD-10-CM

## 2012-11-05 DIAGNOSIS — R32 Unspecified urinary incontinence: Secondary | ICD-10-CM | POA: Insufficient documentation

## 2012-11-05 DIAGNOSIS — N183 Chronic kidney disease, stage 3 unspecified: Secondary | ICD-10-CM | POA: Insufficient documentation

## 2012-11-05 DIAGNOSIS — E1149 Type 2 diabetes mellitus with other diabetic neurological complication: Secondary | ICD-10-CM

## 2012-11-05 LAB — CBC WITH DIFFERENTIAL/PLATELET
Basophils Absolute: 0 10*3/uL (ref 0.0–0.1)
Basophils Relative: 0.5 % (ref 0.0–3.0)
Eosinophils Absolute: 0.2 10*3/uL (ref 0.0–0.7)
Eosinophils Relative: 2.9 % (ref 0.0–5.0)
HCT: 36.2 % (ref 36.0–46.0)
Hemoglobin: 12.1 g/dL (ref 12.0–15.0)
Lymphocytes Relative: 27 % (ref 12.0–46.0)
Lymphs Abs: 1.5 10*3/uL (ref 0.7–4.0)
MCHC: 33.5 g/dL (ref 30.0–36.0)
MCV: 89.3 fl (ref 78.0–100.0)
Monocytes Absolute: 0.2 10*3/uL (ref 0.1–1.0)
Monocytes Relative: 4.3 % (ref 3.0–12.0)
Neutro Abs: 3.7 10*3/uL (ref 1.4–7.7)
Neutrophils Relative %: 65.3 % (ref 43.0–77.0)
Platelets: 278 10*3/uL (ref 150.0–400.0)
RBC: 4.05 Mil/uL (ref 3.87–5.11)
RDW: 14.7 % — ABNORMAL HIGH (ref 11.5–14.6)
WBC: 5.7 10*3/uL (ref 4.5–10.5)

## 2012-11-05 LAB — COMPREHENSIVE METABOLIC PANEL
ALT: 24 U/L (ref 0–35)
AST: 25 U/L (ref 0–37)
Albumin: 4.2 g/dL (ref 3.5–5.2)
Alkaline Phosphatase: 90 U/L (ref 39–117)
BUN: 18 mg/dL (ref 6–23)
CO2: 28 mEq/L (ref 19–32)
Calcium: 9.5 mg/dL (ref 8.4–10.5)
Chloride: 105 mEq/L (ref 96–112)
Creatinine, Ser: 0.9 mg/dL (ref 0.4–1.2)
GFR: 66.92 mL/min (ref >60.00)
Glucose, Bld: 139 mg/dL — ABNORMAL HIGH (ref 70–99)
Potassium: 4.4 mEq/L (ref 3.5–5.1)
Sodium: 141 mEq/L (ref 135–145)
Total Bilirubin: 0.5 mg/dL (ref 0.3–1.2)
Total Protein: 7.7 g/dL (ref 6.0–8.3)

## 2012-11-05 LAB — LIPID PANEL
Cholesterol: 194 mg/dL (ref 0–200)
HDL: 77.7 mg/dL (ref >39.00)
LDL Cholesterol: 90 mg/dL (ref 0–99)
Total CHOL/HDL Ratio: 2
Triglycerides: 130 mg/dL (ref 0.0–149.0)
VLDL: 26 mg/dL (ref 0.0–40.0)

## 2012-11-05 LAB — TSH: TSH: 1.38 u[IU]/mL (ref 0.35–5.50)

## 2012-11-05 LAB — HM DIABETES FOOT EXAM: HM Diabetic Foot Exam: NORMAL

## 2012-11-05 LAB — MICROALBUMIN / CREATININE URINE RATIO
Creatinine,U: 150.5 mg/dL
Microalb Creat Ratio: 0.7 mg/g (ref 0.0–30.0)
Microalb, Ur: 1.1 mg/dL (ref 0.0–1.9)

## 2012-11-05 LAB — HEMOGLOBIN A1C: Hgb A1c MFr Bld: 7.1 % — ABNORMAL HIGH (ref 4.6–6.5)

## 2012-11-05 NOTE — Patient Instructions (Addendum)
Return in one month   I will call you or e m ail you the results of your labs  Referral to therapist underway --------------------------------------------------------------------------------------------------------------------------------------------- This is  my version of a  "Low GI"  Diet:  It is not ultra low carb, but will still lower your blood sugars and allow you to lose 4 to 8 lbs per month if you follow it carefully. All of the foods can be found at grocery stores and in bulk at [employer].  The Atkins protein bars and shakes are available in more varieties at Target, [employer] and [employer].     7 AM Breakfast:  Low carbohydrate Protein  Shakes (I recommend the EAS AdvantEdge "Carb Control" shakes  Or the low carb shakes by Atkins.   Both are available everywhere:  In  cases at BJs  Or in 4 packs at grocery stores and pharmacies  2.5 carbs  (Alternative is  a toasted Arnold's Sandwhich Thin w/ peanut butter, a "Bagel Thin" with cream cheese and salmon) or  a scrambled egg burrito made with a low carb tortilla .  Avoid cereal and bananas, oatmeal too unless you are cooking the old fashioned kind that takes 30-40 minutes to prepare.  the rest is overly processed, has minimal fiber, and is loaded with carbohydrates!   10 AM: Protein bar by Atkins (the snack size, under 200 cal).  There are many varieties , available widely again or in bulk in limited varieties at BJs)  Other so called "protein bars" tend to be loaded with carbohydrates.  Remember, in food advertising, the word "energy" is synonymous for " carbohydrate."  Lunch: sandwich of [place], (or any lunchmeat, grilled meat or canned tuna), fresh avocado, mayonnaise  and cheese on a lower carbohydrate pita bread, flatbread, or tortilla . Ok to use regular mayonnaise. The bread is the only source or carbohydrate that can be decreased (Joseph's makes a pita bread and a flat bread that are 50 cal and 4 net carbs ; Toufayan makes a low carb  flatbread that's 100 cal and 9 net carbs  and  Mission makes a low carb whole wheat tortilla  That is 210 cal and 6 net carbs)  3 PM:  Mid day :  Another protein bar,  Or a  cheese stick (100 cal, 0 carbs),  Or 1 ounce of  almonds, walnuts, pistachios, pecans, peanuts,  Macadamia nuts. Or a Dannon light n Fit greek yogurt, 80 cal 8 net carbs . Avoid "granola"; the dried cranberries and raisins are loaded with carbohydrates. Mixed nuts ok if no raisins or cranberries or dried fruit.      6 PM  Dinner:  "mean and green:"  Meat/chicken/fish or a high protein legume; , with a green salad, and a low GI  Veggie (broccoli, cauliflower, green beans, spinach, brussel sprouts. Lima beans) : Avoid "Low fat dressings, as well as Reyne Dumas and [address]! They are loaded with sugar! Instead use ranch, vinagrette,  Blue cheese, etc.  There is a low carb pasta by Dreamfield's available at [employer] [facility] that is acceptable and tastes great. Try Michel Angel's chicken piccata over low carb pasta. The chicken dish is 0 carbs, and can be found in frozen section at BJs and Lowe's. Also try [employer]" (pulled pork, no sauce,  0 carbs) and his pot roast.   both are in the refrigerated section at BJs   Dreamfield's makes a low carb pasta only 5 g/serving.  Available at  all grocery stores,  And tastes like normal pasta  9 PM snack : Breyer's "low carb" fudgsicle or  ice cream bar (Carb Smart line), or  Weight Watcher's ice cream bar , or another "no sugar added" ice cream;a serving of fresh berries/cherries with whipped cream (Avoid bananas, pineapple, grapes  and watermelon on a regular basis because they are high in sugar)   Remember that snack Substitutions should be less than 10 carbs per serving and meals < 20 carbs. Remember to subtract fiber grams and sugar alcohols to get the "net carbs."

## 2012-11-05 NOTE — Progress Notes (Signed)
Patient ID: Sandra Hendrix, female   DOB: March 10, 1946, 67 y.o.   MRN: [id number]   Patient Active Problem List  Diagnosis  . Diabetes mellitus type 2 with neurological manifestations  . Other and unspecified hyperlipidemia  . Lack of libido  . Urinary incontinence  . Barrett's esophagus  . Hiatal hernia  . Unspecified essential hypertension    Subjective:  CC:   Chief Complaint  Patient presents with  . Establish Care    HPI:   Sandra Hendrix is a 67 y.o. female who presents as a new patient to establish primary care with the chief complaint of Right leg pain and lumps.  She has a history of varicose veins and a History of cellulitis treated April 2010 at [facility] with admission and IV abx.  She has been wearing compression stockings daily.  The nodules have been enlarging over the last 6 months. He has not taken anything for them.  Had a blunt head injury 5 years ago after slipping on the ice.  No concussion,  Still has a bump on her scalp. .    Diabetes mellitus diagnosed 14 yrs ago   Initially controlled with diet for the past year. She checks blood sugars occasionally, fastings have been 126, but had a random sugar that was 200 last week.  She is retired, so she sleeps late and has breakfast at lunch time.   Obesity.  She has been having difficulty losing weight despite eating a healthy diet. , .    Lack of libido x 3 -4 years.  Some vaginal dryness. Bigger issue is loss of respect for husband since he caught him watching sex on his computer several years ago at a time when they were having marital relations.  She had had couples counselling several years ago but the real issue was never discussed .     Past Medical History  Diagnosis Date  . Diabetes mellitus without complication   . Hyperlipidemia   . Hypertension   . Arthritis   . Depression   . GERD (gastroesophageal reflux disease)   . Allergy   . Urinary incontinence   . Varicose veins     Past Surgical  History  Procedure Laterality Date  . Joint replacement Right 2007  . Joint replacement Left 2004    Family History  Problem Relation Age of Onset  . Stroke Mother   . Hypertension Mother   . Lupus Mother   . Alcohol abuse Father   . Diabetes Father   . Arthritis Sister   . Diabetes Sister   . Hyperlipidemia Daughter   . Hypertension Daughter   . Mental retardation Daughter   . Cancer Maternal Aunt     ovarian ca  . Diabetes Paternal Aunt   . Drug abuse Paternal Aunt   . Cancer Maternal Grandfather     colon ca    History   Social History  . Marital Status: Married    Spouse Name: N/A    Number of Children: N/A  . Years of Education: N/A   Occupational History  . Not on file.   Social History Main Topics  . Smoking status: Never Smoker   . Smokeless tobacco: Not on file  . Alcohol Use: No  . Drug Use: No  . Sexually Active: Not on file   Other Topics Concern  . Not on file   Social History Narrative  . No narrative on file   Allergies  Allergen Reactions  . Clindamycin/Lincomycin  Rash  . Codeine Rash  . Morphine And Related Nausea Only  . Talwin (Pentazocine) Nausea And Vomiting    Review of Systems:   Patient denies headache, fevers, malaise, unintentional weight loss, skin rash, eye pain, sinus congestion and sinus pain, sore throat, dysphagia,  hemoptysis , cough, dyspnea, wheezing, chest pain, palpitations, orthopnea, edema, abdominal pain, nausea, melena, diarrhea, constipation, flank pain, dysuria, hematuria, urinary  Frequency, nocturia, numbness, tingling, seizures,  Focal weakness, Loss of consciousness,  Tremor, insomnia, depression, anxiety, and suicidal ideation.    Objective:  BP 132/86  Pulse 65  Temp(Src) 97.5 F (36.4 C) (Oral)  Resp 16  Ht 5\' 1"  (1.549 m)  Wt 172 lb 8 oz (78.245 kg)  BMI 32.61 kg/m2  SpO2 97%  General appearance: alert, cooperative and appears stated age Ears: normal TM's and external ear canals both  ears Throat: lips, mucosa, and tongue normal; teeth and gums normal Neck: no adenopathy, no carotid bruit, supple, symmetrical, trachea midline and thyroid not enlarged, symmetric, no tenderness/mass/nodules Back: symmetric, no curvature. ROM normal. No CVA tenderness. Lungs: clear to auscultation bilaterally Heart: regular rate and rhythm, S1, S2 normal, no murmur, click, rub or gallop Abdomen: soft, non-tender; bowel sounds normal; no masses,  no organomegaly Pulses: 2+ and symmetric Skin: Skin color, texture, turgor normal. No rashes or lesions Lymph nodes: Cervical, supraclavicular, and axillary nodes normal.  Assessment and Plan:  Diabetes mellitus type 2 with neurological manifestations Foot exam was done and sensation is intact . A1c is 7.1  Eye exam is up to date per patient but she is not sure when. On the appropriate medications.   Other and unspecified hyperlipidemia Well controlled on current statin.  No changes today,  LFTs normal.   Lack of libido Chronic, secondary to unresolved marital discord and loack of respect for her husband which stareted after she found that he was watching sex acts on line several years ago when they were still having marital relations.  referral to psychotherpay for couples counselling.   Unspecified essential hypertension Well controlled on current regimen. Renal function stable, no changes today.   A total of 60 minutes, of face to face time, more than half of which was spent in evaluation and treatment of patient, including reviewing records from other prviders and recent laboratory data.   Updated Medication List Outpatient Encounter Prescriptions as of 11/05/2012  Medication Sig Dispense Refill  . albuterol (PROVENTIL HFA;VENTOLIN HFA) 108 (90 BASE) MCG/ACT inhaler Inhale 2 puffs into the lungs every 6 (six) hours as needed for wheezing.      Marland Kitchen aspirin 81 MG tablet Take 81 mg by mouth daily.      Marland Kitchen atorvastatin (LIPITOR) 20 MG tablet Take  1 tablet by mouth daily.      . Azelastine HCl (ASTEPRO NA) Place 1 spray into the nose daily.      Marland Kitchen BIOTIN PO Take by mouth. Take 1000mg  by mouth twice daily.      . Black Cohosh (REMIFEMIN) 20 MG TABS Take 2 tablets by mouth daily.      . Calcium Carbonate-Vitamin D (CALCIUM 600 + D PO) Take 1 tablet by mouth daily.      . celecoxib (CELEBREX) 200 MG capsule Take 200 mg by mouth daily.      . Cholecalciferol (D3-1000 PO) Take 2 tablets in the am and 1 tablet in the pm.      . diclofenac sodium (VOLTAREN) 1 % GEL Apply 2 g topically 4 (four)  times daily.      . fish oil-omega-3 fatty acids 1000 MG capsule Take 1 capsule by mouth daily.      . fluticasone (FLONASE) 50 MCG/ACT nasal spray Place 1 spray into the nose daily.      . furosemide (LASIX) 80 MG tablet Take 80 mg by mouth 2 (two) times daily.      Marland Kitchen glipiZIDE (GLUCOTROL XL) 10 MG 24 hr tablet Take 1 tablet by mouth daily.      Marland Kitchen HYDROcodone-acetaminophen (VICODIN) 5-500 MG per tablet Take 1 tablet by mouth every 8 (eight) hours as needed for pain.      Marland Kitchen lisinopril (PRINIVIL,ZESTRIL) 20 MG tablet Take 1 tablet by mouth daily.      . Meclizine HCl 25 MG CHEW Chew by mouth.      . metFORMIN (GLUMETZA) 500 MG (MOD) 24 hr tablet Take 500 mg by mouth daily with breakfast.      . Multiple Vitamin (MULTIVITAMIN) tablet Take 1 tablet by mouth daily.      Marland Kitchen omeprazole (PRILOSEC) 20 MG capsule Take 20 mg by mouth daily.      . Potassium 99 MG TABS Take 1 tablet by mouth daily.      . sertraline (ZOLOFT) 50 MG tablet Take 1 tablet by mouth daily.      . verapamil (VERELAN PM) 180 MG 24 hr capsule Take 1 capsule by mouth daily.      . vitamin C (ASCORBIC ACID) 500 MG tablet Take 500 mg by mouth daily.       No facility-administered encounter medications on file as of 11/05/2012.     Orders Placed This Encounter  Procedures  . HM MAMMOGRAPHY  . Microalbumin / creatinine urine ratio  . CBC with Differential  . Comprehensive metabolic panel   . TSH  . Lipid panel  . Hemoglobin A1c  . HM PAP SMEAR  . Ambulatory referral to Vascular Surgery  . Ambulatory referral to Psychology  . HM DIABETES EYE EXAM  . HM DIABETES FOOT EXAM  . HM COLONOSCOPY    No Follow-up on file.

## 2012-11-05 NOTE — Assessment & Plan Note (Signed)
Foot exam was done and sensation is intact . A1c is 7.1  Eye exam is up to date per patient but she is not sure when. On the appropriate medications.

## 2012-11-05 NOTE — Assessment & Plan Note (Signed)
Well controlled on current regimen. Renal function stable, no changes today. 

## 2012-11-05 NOTE — Assessment & Plan Note (Signed)
Well controlled on current statin.  No changes today,  LFTs normal.

## 2012-11-05 NOTE — Assessment & Plan Note (Signed)
Chronic, secondary to unresolved marital discord and loack of respect for her husband which stareted after she found that he was watching sex acts on line several years ago when they were still having marital relations.  referral to [facility] for couples counselling.

## 2012-11-10 ENCOUNTER — Other Ambulatory Visit: Payer: Self-pay

## 2012-11-12 ENCOUNTER — Other Ambulatory Visit: Payer: Self-pay | Admitting: *Deleted

## 2012-11-14 MED ORDER — FLUTICASONE PROPIONATE 50 MCG/ACT NA SUSP: 1.0000 | Freq: Every day | NASAL | Status: DC

## 2012-11-14 MED ORDER — LISINOPRIL 20 MG PO TABS: 20.0000 mg | ORAL_TABLET | Freq: Every day | ORAL | Status: DC

## 2012-11-14 MED ORDER — AZELASTINE HCL 0.1 % NA SOLN: 1.0000 | Freq: Two times a day (BID) | NASAL | Status: DC

## 2012-11-14 MED ORDER — SERTRALINE HCL 50 MG PO TABS: 50.0000 mg | ORAL_TABLET | Freq: Every day | ORAL | Status: DC

## 2012-11-23 ENCOUNTER — Other Ambulatory Visit: Payer: Self-pay | Admitting: *Deleted

## 2012-11-23 MED ORDER — GLIPIZIDE ER 10 MG PO TB24: 10.0000 mg | ORAL_TABLET | Freq: Every day | ORAL | Status: DC

## 2012-11-23 MED ORDER — VERAPAMIL HCL ER 180 MG PO CP24: 180.0000 mg | ORAL_CAPSULE | Freq: Every day | ORAL | Status: DC

## 2012-11-23 MED ORDER — OMEPRAZOLE 20 MG PO CPDR: 20.0000 mg | DELAYED_RELEASE_CAPSULE | Freq: Every day | ORAL | Status: DC

## 2012-11-23 MED ORDER — METFORMIN HCL ER (MOD) 500 MG PO TB24: 500.0000 mg | ORAL_TABLET | Freq: Every day | ORAL | Status: DC

## 2012-11-26 ENCOUNTER — Telehealth: Payer: Self-pay | Admitting: *Deleted

## 2012-11-26 MED ORDER — GLIPIZIDE ER 10 MG PO TB24: 10.0000 mg | ORAL_TABLET | Freq: Every day | ORAL | Status: DC

## 2012-11-26 NOTE — Telephone Encounter (Signed)
Med filled.  

## 2012-11-26 NOTE — Telephone Encounter (Signed)
Received a faxed from [facility] request a 90-day supply for Glipiside ER 10 mg   Fax: [phone]

## 2012-11-28 ENCOUNTER — Ambulatory Visit (INDEPENDENT_AMBULATORY_CARE_PROVIDER_SITE_OTHER): Payer: Medicare PPO | Admitting: Psychology

## 2012-11-28 DIAGNOSIS — F4323 Adjustment disorder with mixed anxiety and depressed mood: Secondary | ICD-10-CM

## 2012-11-29 ENCOUNTER — Other Ambulatory Visit: Payer: Self-pay | Admitting: Internal Medicine

## 2012-11-29 MED ORDER — FUROSEMIDE 80 MG PO TABS: 80.0000 mg | ORAL_TABLET | Freq: Two times a day (BID) | ORAL | Status: DC

## 2012-11-29 MED ORDER — LISINOPRIL 20 MG PO TABS: 20.0000 mg | ORAL_TABLET | Freq: Every day | ORAL | Status: DC

## 2012-11-29 MED ORDER — ATORVASTATIN CALCIUM 20 MG PO TABS: 20.0000 mg | ORAL_TABLET | Freq: Every day | ORAL | Status: DC

## 2012-11-29 NOTE — Telephone Encounter (Signed)
Pt meds minus metformin have been refilled. Per you last OV note pt is to be taking 1 metformin daily. Per pt she is taking 4 metformin daily. Please advise.

## 2012-11-29 NOTE — Telephone Encounter (Signed)
Pt came in today  Stating her [facility] sent her (glumatza) pt needed generic  Pt stated she takes her metform 4 x daily and if she gets (glumatza) it will cost $100 for 90 pills  Please send rx for metform    Pt is out of lipitor GENERIC atorvastatih 20mg   Also needs refill on  Furosemide 80mg  lisinoprill 20mg   Pt would like all GENERIC meds when possible [facility] fax # is [phone]  PT WOULD LIKE TO GET A 90 DAY SUPPLY OF ALL THE MEDS Please advise pt when this is called id

## 2012-11-30 MED ORDER — METFORMIN HCL ER (OSM) 1000 MG PO TB24: 1000.0000 mg | ORAL_TABLET | Freq: Two times a day (BID) | ORAL | Status: DC

## 2012-11-30 NOTE — Telephone Encounter (Signed)
i changed the metformin to 1000 mg tablet  So she can take two daily instead of 4

## 2012-12-03 NOTE — Telephone Encounter (Signed)
Called patient to advise her of the medication change. Patient states that she wants the Metformin changed back to 4 daily because it is much cheaper for her. Patient states that she has talked with the pharmacy and they need a corrected prescription sent in to them.

## 2012-12-06 ENCOUNTER — Ambulatory Visit (INDEPENDENT_AMBULATORY_CARE_PROVIDER_SITE_OTHER): Payer: Medicare PPO | Admitting: Internal Medicine

## 2012-12-06 ENCOUNTER — Encounter: Payer: Self-pay | Admitting: Internal Medicine

## 2012-12-06 VITALS — BP 128/82 | HR 68 | Temp 97.8°F | Resp 16 | Wt 168.8 lb

## 2012-12-06 DIAGNOSIS — E1142 Type 2 diabetes mellitus with diabetic polyneuropathy: Secondary | ICD-10-CM

## 2012-12-06 DIAGNOSIS — E1149 Type 2 diabetes mellitus with other diabetic neurological complication: Secondary | ICD-10-CM

## 2012-12-06 NOTE — Patient Instructions (Addendum)
If the metformin you receive is a 1000 mg tablet only take 1 in the morning and 1 at night   Your diabetes and cholesterol are excellent! No changes  If you have recurrent low blood sugars,  Let me know when they are occurring so we can adust your glipizide dose

## 2012-12-07 MED ORDER — METFORMIN HCL ER (OSM) 500 MG PO TB24: 1000.0000 mg | ORAL_TABLET | Freq: Two times a day (BID) | ORAL | Status: DC

## 2012-12-07 NOTE — Telephone Encounter (Signed)
fortamet changed back to 500 mg tablets  Sig:  2 bid ( 4 daily)  Qty #   360/90 days.  And sent.

## 2012-12-07 NOTE — Addendum Note (Signed)
Addended by: Sherlene Shams on: 12/07/2012 05:42 PM   Modules accepted: Orders

## 2012-12-08 NOTE — Progress Notes (Signed)
Patient ID: Sandra Hendrix, female   DOB: [birth year], 67 y.o.   MRN: [id number]   Patient Active Problem List  Diagnosis  . Diabetes mellitus type 2 with neurological manifestations  . Other and unspecified hyperlipidemia  . Lack of libido  . Urinary incontinence  . Barrett's esophagus  . Hiatal hernia  . Unspecified essential hypertension    Subjective:  CC:   Chief Complaint  Patient presents with  . Follow-up    HPI:   Sandra Flowersis a 67 y.o. female who presents for one month follow up on diabetes mellitus, hyperlipidemia.  Newly established patient  Recent labs showed good control of diabetes , hyperlipidemia and hypertension.   She has been having no adverse effects from her medications and has had no hypoglycemic effects.    Past Medical History  Diagnosis Date  . Diabetes mellitus without complication   . Hyperlipidemia   . Hypertension   . Arthritis   . Depression   . GERD (gastroesophageal reflux disease)   . Allergy   . Urinary incontinence   . Varicose veins     Past Surgical History  Procedure Laterality Date  . Joint replacement Right 2007  . Joint replacement Left 2004       The following portions of the patient's history were reviewed and updated as appropriate: Allergies, current medications, and problem list.    Review of Systems:   Patient denies headache, fevers, malaise, unintentional weight loss, skin rash, eye pain, sinus congestion and sinus pain, sore throat, dysphagia,  hemoptysis , cough, dyspnea, wheezing, chest pain, palpitations, orthopnea, edema, abdominal pain, nausea, melena, diarrhea, constipation, flank pain, dysuria, hematuria, urinary  Frequency, nocturia, numbness, tingling, seizures,  Focal weakness, Loss of consciousness,  Tremor, insomnia, depression, anxiety, and suicidal ideation.     History   Social History  . Marital Status: Married    Spouse Name: N/A    Number of Children: N/A  . Years of Education: N/A    Occupational History  . Not on file.   Social History Main Topics  . Smoking status: Never Smoker   . Smokeless tobacco: Not on file  . Alcohol Use: No  . Drug Use: No  . Sexually Active: Not on file   Other Topics Concern  . Not on file   Social History Narrative  . No narrative on file    Objective:  BP 128/82  Pulse 68  Temp(Src) 97.8 F (36.6 C) (Oral)  Resp 16  Wt 168 lb 12 oz (76.544 kg)  BMI 31.9 kg/m2  SpO2 97%  General appearance: alert, cooperative and appears stated age Ears: normal TM's and external ear canals both ears Throat: lips, mucosa, and tongue normal; teeth and gums normal Neck: no adenopathy, no carotid bruit, supple, symmetrical, trachea midline and thyroid not enlarged, symmetric, no tenderness/mass/nodules Back: symmetric, no curvature. ROM normal. No CVA tenderness. Lungs: clear to auscultation bilaterally Heart: regular rate and rhythm, S1, S2 normal, no murmur, click, rub or gallop Abdomen: soft, non-tender; bowel sounds normal; no masses,  no organomegaly Pulses: 2+ and symmetric Skin: Skin color, texture, turgor normal. No rashes or lesions Lymph nodes: Cervical, supraclavicular, and axillary nodes normal.  Assessment and Plan:  Diabetes mellitus type 2 with neurological manifestations Well controlled on current regimen, HgbA1c  7.1   No changes  Today. Reminder for annual diabetic eye exam given..  Foot exam done. Meds reviewed and she is on a baby aspirin daily., a statin and an ACE inhibitor.  Urine tested for protein.    A total of 25 minutes of face to face time was spent with patient more than half of which was spent in counselling and coordination of care   Updated Medication List Outpatient Encounter Prescriptions as of 12/06/2012  Medication Sig Dispense Refill  . albuterol (PROVENTIL HFA;VENTOLIN HFA) 108 (90 BASE) MCG/ACT inhaler Inhale 2 puffs into the lungs every 6 (six) hours as needed for wheezing.      Marland Kitchen aspirin 81  MG tablet Take 81 mg by mouth daily.      Marland Kitchen atorvastatin (LIPITOR) 20 MG tablet Take 1 tablet (20 mg total) by mouth daily.  90 tablet  1  . azelastine (ASTEPRO) 137 MCG/SPRAY nasal spray Place 1 spray into the nose 2 (two) times daily. Use in each nostril as directed  30 mL  11  . BIOTIN PO Take by mouth. Take 1000mg  by mouth twice daily.      . Black Cohosh (REMIFEMIN) 20 MG TABS Take 2 tablets by mouth daily.      . Calcium Carbonate-Vitamin D (CALCIUM 600 + D PO) Take 1 tablet by mouth daily.      . Cholecalciferol (D3-1000 PO) Take 2 tablets in the am and 1 tablet in the pm.      . diclofenac sodium (VOLTAREN) 1 % GEL Apply 2 g topically 4 (four) times daily.      . fish oil-omega-3 fatty acids 1000 MG capsule Take 1 capsule by mouth daily.      . fluticasone (FLONASE) 50 MCG/ACT nasal spray Place 1 spray into the nose daily.  16 g  11  . furosemide (LASIX) 80 MG tablet Take 1 tablet (80 mg total) by mouth 2 (two) times daily.  180 tablet  1  . glipiZIDE (GLUCOTROL XL) 10 MG 24 hr tablet Take 1 tablet (10 mg total) by mouth daily.  90 tablet  1  . HYDROcodone-acetaminophen (VICODIN) 5-500 MG per tablet Take 1 tablet by mouth every 8 (eight) hours as needed for pain.      Marland Kitchen lisinopril (PRINIVIL,ZESTRIL) 20 MG tablet Take 1 tablet (20 mg total) by mouth daily.  90 tablet  1  . Meclizine HCl 25 MG CHEW Chew by mouth.      . Multiple Vitamin (MULTIVITAMIN) tablet Take 1 tablet by mouth daily.      Marland Kitchen omeprazole (PRILOSEC) 20 MG capsule Take 1 capsule (20 mg total) by mouth daily.  90 capsule  1  . Potassium 99 MG TABS Take 1 tablet by mouth daily.      . sertraline (ZOLOFT) 50 MG tablet Take 1 tablet (50 mg total) by mouth daily.  30 tablet  5  . verapamil (VERELAN PM) 180 MG 24 hr capsule Take 1 capsule (180 mg total) by mouth daily.  90 capsule  1  . vitamin C (ASCORBIC ACID) 500 MG tablet Take 500 mg by mouth daily.      . [DISCONTINUED] metFORMIN (FORTAMET) 1000 MG (OSM) 24 hr tablet Take 1  tablet (1,000 mg total) by mouth 2 (two) times daily with a meal.  180 tablet  1  . [DISCONTINUED] metformin (FORTAMET) 1000 MG (OSM) 24 hr tablet Take 2 tablets by mouth in the am and 2 tablets by mouth in the pm.      . celecoxib (CELEBREX) 200 MG capsule Take 200 mg by mouth daily.       No facility-administered encounter medications on file as of 12/06/2012.  No orders of the defined types were placed in this encounter.    No Follow-up on file.

## 2012-12-08 NOTE — Assessment & Plan Note (Addendum)
Well controlled on current regimen, HgbA1c  7.1   No changes  Today. Reminder for annual diabetic eye exam given..  Foot exam done. Meds reviewed and she is on a baby aspirin daily., a statin and an ACE inhibitor.  Urine tested for protein.

## 2012-12-11 ENCOUNTER — Encounter: Payer: Self-pay | Admitting: Internal Medicine

## 2012-12-11 ENCOUNTER — Other Ambulatory Visit: Payer: Self-pay | Admitting: Internal Medicine

## 2012-12-11 MED ORDER — FUROSEMIDE 80 MG PO TABS: 80.0000 mg | ORAL_TABLET | Freq: Every day | ORAL | Status: DC

## 2012-12-19 ENCOUNTER — Ambulatory Visit (INDEPENDENT_AMBULATORY_CARE_PROVIDER_SITE_OTHER): Payer: Medicare PPO | Admitting: Psychology

## 2012-12-19 DIAGNOSIS — F4323 Adjustment disorder with mixed anxiety and depressed mood: Secondary | ICD-10-CM

## 2013-01-02 ENCOUNTER — Ambulatory Visit (INDEPENDENT_AMBULATORY_CARE_PROVIDER_SITE_OTHER): Payer: Medicare PPO | Admitting: Psychology

## 2013-01-02 DIAGNOSIS — F4323 Adjustment disorder with mixed anxiety and depressed mood: Secondary | ICD-10-CM

## 2013-03-11 ENCOUNTER — Telehealth: Payer: Self-pay | Admitting: *Deleted

## 2013-03-11 ENCOUNTER — Encounter: Payer: Self-pay | Admitting: Internal Medicine

## 2013-03-11 ENCOUNTER — Ambulatory Visit (INDEPENDENT_AMBULATORY_CARE_PROVIDER_SITE_OTHER): Payer: Medicare PPO | Admitting: Internal Medicine

## 2013-03-11 VITALS — BP 120/70 | HR 74 | Temp 98.1°F | Resp 16 | Wt 174.0 lb

## 2013-03-11 DIAGNOSIS — I10 Essential (primary) hypertension: Secondary | ICD-10-CM

## 2013-03-11 DIAGNOSIS — E118 Type 2 diabetes mellitus with unspecified complications: Secondary | ICD-10-CM

## 2013-03-11 DIAGNOSIS — E1149 Type 2 diabetes mellitus with other diabetic neurological complication: Secondary | ICD-10-CM

## 2013-03-11 DIAGNOSIS — E1165 Type 2 diabetes mellitus with hyperglycemia: Secondary | ICD-10-CM

## 2013-03-11 DIAGNOSIS — E785 Hyperlipidemia, unspecified: Secondary | ICD-10-CM

## 2013-03-11 DIAGNOSIS — E1142 Type 2 diabetes mellitus with diabetic polyneuropathy: Secondary | ICD-10-CM

## 2013-03-11 LAB — COMPREHENSIVE METABOLIC PANEL
ALT: 22 U/L (ref 0–35)
AST: 22 U/L (ref 0–37)
Albumin: 4.1 g/dL (ref 3.5–5.2)
Alkaline Phosphatase: 81 U/L (ref 39–117)
BUN: 27 mg/dL — ABNORMAL HIGH (ref 6–23)
CO2: 31 mEq/L (ref 19–32)
Calcium: 9.8 mg/dL (ref 8.4–10.5)
Chloride: 101 mEq/L (ref 96–112)
Creatinine, Ser: 1 mg/dL (ref 0.4–1.2)
GFR: 55.86 mL/min — ABNORMAL LOW (ref >60.00)
Glucose, Bld: 130 mg/dL — ABNORMAL HIGH (ref 70–99)
Potassium: 5 mEq/L (ref 3.5–5.1)
Sodium: 137 mEq/L (ref 135–145)
Total Bilirubin: 0.5 mg/dL (ref 0.3–1.2)
Total Protein: 7.1 g/dL (ref 6.0–8.3)

## 2013-03-11 LAB — HM DIABETES FOOT EXAM: HM Diabetic Foot Exam: NORMAL

## 2013-03-11 LAB — HEMOGLOBIN A1C: Hgb A1c MFr Bld: 7 % — ABNORMAL HIGH (ref 4.6–6.5)

## 2013-03-11 NOTE — Telephone Encounter (Signed)
I have a wet prep and another vaginal swab for this patient, what dx code would you like for them?

## 2013-03-11 NOTE — Progress Notes (Signed)
Patient ID: Sandra Hendrix, female   DOB: 1946-03-05, 67 y.o.   MRN: [id number]   SUBJECTIVE: 67 y.o. female for follow up of diabetes. Diabetic Review of Systems - medication compliance: compliant all of the time, diabetic diet compliance: compliant all of the time, compliant most of the time, home glucose monitoring: is performed regularly.  Other symptoms and concerns:  Occasional hypoglycemic events brought on by missing lunch due to busy schedule and use of Glucotrol XL   Current Outpatient Prescriptions  Medication Sig Dispense Refill  . albuterol (PROVENTIL HFA;VENTOLIN HFA) 108 (90 BASE) MCG/ACT inhaler Inhale 2 puffs into the lungs every 6 (six) hours as needed for wheezing.      Marland Kitchen aspirin 81 MG tablet Take 81 mg by mouth daily.      Marland Kitchen atorvastatin (LIPITOR) 20 MG tablet Take 1 tablet (20 mg total) by mouth daily.  90 tablet  1  . azelastine (ASTEPRO) 137 MCG/SPRAY nasal spray Place 1 spray into the nose 2 (two) times daily. Use in each nostril as directed  30 mL  11  . Black Cohosh (REMIFEMIN) 20 MG TABS Take 2 tablets by mouth daily.      . Calcium Carbonate-Vitamin D (CALCIUM 600 + D PO) Take 1 tablet by mouth daily.      . celecoxib (CELEBREX) 200 MG capsule Take 200 mg by mouth daily.      . Cholecalciferol (D3-1000 PO) Take 2 tablets in the am and 1 tablet in the pm.      . diclofenac sodium (VOLTAREN) 1 % GEL Apply 2 g topically 4 (four) times daily.      . fish oil-omega-3 fatty acids 1000 MG capsule Take 1 capsule by mouth daily.      . fluticasone (FLONASE) 50 MCG/ACT nasal spray Place 1 spray into the nose daily.  16 g  11  . furosemide (LASIX) 80 MG tablet Take 1 tablet (80 mg total) by mouth daily.  90 tablet  1  . glipiZIDE (GLUCOTROL XL) 10 MG 24 hr tablet Take 1 tablet (10 mg total) by mouth daily.  90 tablet  1  . HYDROcodone-acetaminophen (VICODIN) 5-500 MG per tablet Take 1 tablet by mouth every 8 (eight) hours as needed for pain.      Marland Kitchen lisinopril (PRINIVIL,ZESTRIL)  20 MG tablet Take 1 tablet (20 mg total) by mouth daily.  90 tablet  1  . metformin (FORTAMET) 500 MG (OSM) 24 hr tablet Take 2 tablets (1,000 mg total) by mouth 2 (two) times daily with a meal.  360 tablet  3  . Multiple Vitamin (MULTIVITAMIN) tablet Take 1 tablet by mouth daily.      Marland Kitchen omeprazole (PRILOSEC) 20 MG capsule Take 1 capsule (20 mg total) by mouth daily.  90 capsule  1  . Potassium 99 MG TABS Take 1 tablet by mouth daily.      . sertraline (ZOLOFT) 50 MG tablet Take 1 tablet (50 mg total) by mouth daily.  30 tablet  5  . verapamil (VERELAN PM) 180 MG 24 hr capsule Take 1 capsule (180 mg total) by mouth daily.  90 capsule  1  . vitamin C (ASCORBIC ACID) 500 MG tablet Take 500 mg by mouth daily.      Marland Kitchen BIOTIN PO Take by mouth. Take 1000mg  by mouth twice daily.      . Meclizine HCl 25 MG CHEW Chew by mouth.       No current facility-administered medications for this visit.  OBJECTIVE: Appearance: alert, well appearing, and in no distress, oriented to person, place, and time, overweight and well hydrated. BP 120/70  Pulse 74  Temp(Src) 98.1 F (36.7 C) (Oral)  Resp 16  Wt 174 lb (78.926 kg)  BMI 32.89 kg/m2  SpO2 97%  General appearance: alert, cooperative and appears stated age Ears: normal TM's and external ear canals both ears Throat: lips, mucosa, and tongue normal; teeth and gums normal Neck: no adenopathy, no carotid bruit, supple, symmetrical, trachea midline and thyroid not enlarged, symmetric, no tenderness/mass/nodules Back: symmetric, no curvature. ROM normal. No CVA tenderness. Lungs: clear to auscultation bilaterally Heart: regular rate and rhythm, S1, S2 normal, no murmur, click, rub or gallop Abdomen: soft, non-tender; bowel sounds normal; no masses,  no organomegaly Pulses: 2+ and symmetric Skin: Skin color, texture, turgor normal. No rashes or lesions Lymph nodes: Cervical, supraclavicular, and axillary nodes normal.  Assessment and Plan  Unspecified  essential hypertension Well controlled on current regimen. Renal function stable, no changes today.  Other and unspecified hyperlipidemia LDL and triglycerides are at goal on current medications. He has no side effects and liver enzymes are normal. No changes today   Diabetes mellitus type 2 with neurological manifestations Reminder for annual diabetic eye exam given..  Foot exam done. Meds reviewed and she is on a baby aspirin daily., a statin and an ACE inhibitor.  Urine tested for protein. Advised to take Atkins bars with her to avoid hypoglycemic events ,  And if recurrent will change glucotrol xl to short acting.     Updated Medication List Outpatient Encounter Prescriptions as of 03/11/2013  Medication Sig Dispense Refill  . albuterol (PROVENTIL HFA;VENTOLIN HFA) 108 (90 BASE) MCG/ACT inhaler Inhale 2 puffs into the lungs every 6 (six) hours as needed for wheezing.      Marland Kitchen aspirin 81 MG tablet Take 81 mg by mouth daily.      Marland Kitchen atorvastatin (LIPITOR) 20 MG tablet Take 1 tablet (20 mg total) by mouth daily.  90 tablet  1  . azelastine (ASTEPRO) 137 MCG/SPRAY nasal spray Place 1 spray into the nose 2 (two) times daily. Use in each nostril as directed  30 mL  11  . Black Cohosh (REMIFEMIN) 20 MG TABS Take 2 tablets by mouth daily.      . Calcium Carbonate-Vitamin D (CALCIUM 600 + D PO) Take 1 tablet by mouth daily.      . celecoxib (CELEBREX) 200 MG capsule Take 200 mg by mouth daily.      . Cholecalciferol (D3-1000 PO) Take 2 tablets in the am and 1 tablet in the pm.      . diclofenac sodium (VOLTAREN) 1 % GEL Apply 2 g topically 4 (four) times daily.      . fish oil-omega-3 fatty acids 1000 MG capsule Take 1 capsule by mouth daily.      . fluticasone (FLONASE) 50 MCG/ACT nasal spray Place 1 spray into the nose daily.  16 g  11  . furosemide (LASIX) 80 MG tablet Take 1 tablet (80 mg total) by mouth daily.  90 tablet  1  . glipiZIDE (GLUCOTROL XL) 10 MG 24 hr tablet Take 1 tablet (10 mg  total) by mouth daily.  90 tablet  1  . HYDROcodone-acetaminophen (VICODIN) 5-500 MG per tablet Take 1 tablet by mouth every 8 (eight) hours as needed for pain.      Marland Kitchen lisinopril (PRINIVIL,ZESTRIL) 20 MG tablet Take 1 tablet (20 mg total) by mouth daily.  90 tablet  1  . metformin (FORTAMET) 500 MG (OSM) 24 hr tablet Take 2 tablets (1,000 mg total) by mouth 2 (two) times daily with a meal.  360 tablet  3  . Multiple Vitamin (MULTIVITAMIN) tablet Take 1 tablet by mouth daily.      Marland Kitchen omeprazole (PRILOSEC) 20 MG capsule Take 1 capsule (20 mg total) by mouth daily.  90 capsule  1  . Potassium 99 MG TABS Take 1 tablet by mouth daily.      . sertraline (ZOLOFT) 50 MG tablet Take 1 tablet (50 mg total) by mouth daily.  30 tablet  5  . verapamil (VERELAN PM) 180 MG 24 hr capsule Take 1 capsule (180 mg total) by mouth daily.  90 capsule  1  . vitamin C (ASCORBIC ACID) 500 MG tablet Take 500 mg by mouth daily.      Marland Kitchen BIOTIN PO Take by mouth. Take 1000mg  by mouth twice daily.      . Meclizine HCl 25 MG CHEW Chew by mouth.       No facility-administered encounter medications on file as of 03/11/2013.

## 2013-03-11 NOTE — Assessment & Plan Note (Signed)
Reminder for annual diabetic eye exam given..  Foot exam done. Meds reviewed and she is on a baby aspirin daily., a statin and an ACE inhibitor.  Urine tested for protein. Advised to take Atkins bars with her to avoid hypoglycemic events ,  And if recurrent will change glucotrol xl to short acting.

## 2013-03-11 NOTE — Patient Instructions (Signed)
Consider taking Atkins bars with you    We will find out today what happened to your right leg ultrasound.    We will refer you to [facility] podiatrist  To evaluate your right foot pain  We will call you with the results of your labs today   I will look for a covered alternative to the cordran SP 0.05% cream your face for

## 2013-03-11 NOTE — Assessment & Plan Note (Signed)
LDL and triglycerides are at goal on current medications. He has no side effects and liver enzymes are normal. No changes today  

## 2013-03-11 NOTE — Assessment & Plan Note (Signed)
Well controlled on current regimen. Renal function stable, no changes today. 

## 2013-03-12 ENCOUNTER — Encounter: Payer: Self-pay | Admitting: Internal Medicine

## 2013-03-12 ENCOUNTER — Telehealth: Payer: Self-pay | Admitting: Internal Medicine

## 2013-03-12 NOTE — Telephone Encounter (Signed)
I also need to have refill for my Accu-chek softclix lancets and strips. I didn't put this on my list of prescriptions. Thank you for your help, Per my chart

## 2013-03-13 NOTE — Telephone Encounter (Signed)
Please advise to

## 2013-03-13 NOTE — Telephone Encounter (Signed)
Please call in her refills for accuchecks lancets and strip[s per request  below.  Tx should read"  use to check blood sugars once daily for diabetes"  Code 250.00   Or insurance won't pay

## 2013-03-15 ENCOUNTER — Encounter: Payer: Self-pay | Admitting: Internal Medicine

## 2013-03-15 NOTE — Telephone Encounter (Signed)
Strips and lancets called to pharmacy as requested.

## 2013-03-19 NOTE — Telephone Encounter (Signed)
Please advise as to results

## 2013-03-20 ENCOUNTER — Telehealth: Payer: Self-pay | Admitting: Internal Medicine

## 2013-03-20 NOTE — Telephone Encounter (Signed)
Patient needing a refill on her Cordran SP 0.05 % the generic form. Patient wanting results also from [facility].

## 2013-03-21 ENCOUNTER — Telehealth: Payer: Self-pay | Admitting: Internal Medicine

## 2013-03-21 DIAGNOSIS — I872 Venous insufficiency (chronic) (peripheral): Secondary | ICD-10-CM

## 2013-03-21 LAB — HM PAP SMEAR: HM Pap smear: NORMAL

## 2013-03-21 LAB — HM MAMMOGRAPHY: HM Mammogram: NORMAL

## 2013-03-21 MED ORDER — FLURANDRENOLIDE 0.05 % EX LOTN: TOPICAL_LOTION | Freq: Two times a day (BID) | CUTANEOUS | Status: DC

## 2013-03-21 NOTE — Telephone Encounter (Signed)
RX PRINTED FOR GENERIC CORDRAN.  CALL TO [facility]

## 2013-03-21 NOTE — Telephone Encounter (Signed)
The ultrasound she had in March of right leg showed insufficiency of the Right saphenous right common femoral and proximal superficial femoral veins,  Which is why that leg swells.  Needs to wear compression stockings or knee highs to managed swelling

## 2013-03-21 NOTE — Telephone Encounter (Signed)
Patient notified as directed

## 2013-03-21 NOTE — Telephone Encounter (Signed)
Script sent to pharmacy.

## 2013-03-21 NOTE — Telephone Encounter (Signed)
Called patient to verify and patient stated this medication you were going to find her a generic of because the Name brand to expensive. Originally prescribed by Dermatologist patient stated.

## 2013-04-18 ENCOUNTER — Encounter: Payer: Self-pay | Admitting: Internal Medicine

## 2013-05-01 ENCOUNTER — Other Ambulatory Visit: Payer: Self-pay

## 2013-07-08 LAB — HM DIABETES EYE EXAM

## 2013-07-15 ENCOUNTER — Encounter: Payer: Self-pay | Admitting: Internal Medicine

## 2013-07-15 ENCOUNTER — Ambulatory Visit (INDEPENDENT_AMBULATORY_CARE_PROVIDER_SITE_OTHER): Payer: Medicare PPO | Admitting: Internal Medicine

## 2013-07-15 VITALS — BP 122/74 | HR 71 | Temp 97.5°F | Resp 14 | Ht 61.0 in | Wt 171.8 lb

## 2013-07-15 DIAGNOSIS — I10 Essential (primary) hypertension: Secondary | ICD-10-CM

## 2013-07-15 DIAGNOSIS — E1149 Type 2 diabetes mellitus with other diabetic neurological complication: Secondary | ICD-10-CM

## 2013-07-15 DIAGNOSIS — E1142 Type 2 diabetes mellitus with diabetic polyneuropathy: Secondary | ICD-10-CM

## 2013-07-15 DIAGNOSIS — N182 Chronic kidney disease, stage 2 (mild): Secondary | ICD-10-CM

## 2013-07-15 DIAGNOSIS — Z1239 Encounter for other screening for malignant neoplasm of breast: Secondary | ICD-10-CM

## 2013-07-15 DIAGNOSIS — E785 Hyperlipidemia, unspecified: Secondary | ICD-10-CM | POA: Insufficient documentation

## 2013-07-15 DIAGNOSIS — E538 Deficiency of other specified B group vitamins: Secondary | ICD-10-CM

## 2013-07-15 DIAGNOSIS — E669 Obesity, unspecified: Secondary | ICD-10-CM

## 2013-07-15 LAB — VITAMIN B12: Vitamin B-12: 666 pg/mL (ref 211–911)

## 2013-07-15 LAB — LIPID PANEL
Cholesterol: 205 mg/dL — ABNORMAL HIGH (ref 0–200)
HDL: 82.2 mg/dL (ref >39.00)
Total CHOL/HDL Ratio: 2
Triglycerides: 144 mg/dL (ref 0.0–149.0)
VLDL: 28.8 mg/dL (ref 0.0–40.0)

## 2013-07-15 LAB — MICROALBUMIN / CREATININE URINE RATIO
Creatinine,U: 131.6 mg/dL
Microalb Creat Ratio: 0.7 mg/g (ref 0.0–30.0)
Microalb, Ur: 0.9 mg/dL (ref 0.0–1.9)

## 2013-07-15 LAB — COMPREHENSIVE METABOLIC PANEL
ALT: 20 U/L (ref 0–35)
AST: 26 U/L (ref 0–37)
Albumin: 4.4 g/dL (ref 3.5–5.2)
Alkaline Phosphatase: 97 U/L (ref 39–117)
BUN: 35 mg/dL — ABNORMAL HIGH (ref 6–23)
CO2: 26 mEq/L (ref 19–32)
Calcium: 10.2 mg/dL (ref 8.4–10.5)
Chloride: 102 mEq/L (ref 96–112)
Creatinine, Ser: 1.3 mg/dL — ABNORMAL HIGH (ref 0.4–1.2)
GFR: 43.91 mL/min — ABNORMAL LOW (ref >60.00)
Glucose, Bld: 121 mg/dL — ABNORMAL HIGH (ref 70–99)
Potassium: 4.6 mEq/L (ref 3.5–5.1)
Sodium: 139 mEq/L (ref 135–145)
Total Bilirubin: 0.5 mg/dL (ref 0.3–1.2)
Total Protein: 8 g/dL (ref 6.0–8.3)

## 2013-07-15 LAB — HEMOGLOBIN A1C: Hgb A1c MFr Bld: 7.3 % — ABNORMAL HIGH (ref 4.6–6.5)

## 2013-07-15 LAB — LDL CHOLESTEROL, DIRECT: Direct LDL: 98.7 mg/dL

## 2013-07-15 MED ORDER — ZOSTER VACCINE LIVE 19400 UNT/0.65ML ~~LOC~~ SOLR: 0.6500 mL | Freq: Once | SUBCUTANEOUS | Status: DC

## 2013-07-15 NOTE — Assessment & Plan Note (Addendum)
Well-controlled on current medications.  hemoglobin A1c has risen to 7.3.   She is up-to-date on eye exams and her foot exam is normal. Normal urine microalbumin to creatinine ratio . She is on the appropriate medications. Advised to take Atkins bars with her to avoid hypoglycemic events ,  And if recurrent will change glucotrol xl to short acting.

## 2013-07-15 NOTE — Progress Notes (Signed)
Patient ID: Sandra Hendrix, female   DOB: [birth year], 67 y.o.   MRN: [id number]   Patient Active Problem List   Diagnosis Date Noted  . Other and unspecified hyperlipidemia 07/15/2013  . Chronic venous insufficiency 03/21/2013  . DM (diabetes mellitus), type 2 with renal complications 11/05/2012  . Lack of libido 11/05/2012  . Barrett's esophagus 11/05/2012  . Hiatal hernia 11/05/2012  . Unspecified essential hypertension 11/05/2012  . Urinary incontinence     Subjective:  CC:   Chief Complaint  Patient presents with  . Follow-up    4 months    HPI:   Sandra Flowersis a 67 y.o. female who presents for Follow up on DM type 2.  She has been checking her blood sugars regularly.  2 hr post prandial 111, none above 150 except rarely. Very disciplined ,  No sweets in the house,.  fastings are 130 to 140   Has had infrequent lows,  which typically occur  Late afternoon, when she skips a mid afternoon snack.   None in the middle  of the night.  Had eye exam last week,  Everything was normal except for  early cataracts     Past Medical History  Diagnosis Date  . Diabetes mellitus without complication   . Hyperlipidemia   . Hypertension   . Arthritis   . Depression   . GERD (gastroesophageal reflux disease)   . Allergy   . Urinary incontinence   . Varicose veins     Past Surgical History  Procedure Laterality Date  . Joint replacement Right 2007  . Joint replacement Left 2004       The following portions of the patient's history were reviewed and updated as appropriate: Allergies, current medications, and problem list.    Review of Systems:   12 Pt  review of systems was negative except those addressed in the HPI,     History   Social History  . Marital Status: Married    Spouse Name: N/A    Number of Children: N/A  . Years of Education: N/A   Occupational History  . Not on file.   Social History Main Topics  . Smoking status: Never Smoker   .  Smokeless tobacco: Not on file  . Alcohol Use: No  . Drug Use: No  . Sexual Activity: Not on file   Other Topics Concern  . Not on file   Social History Narrative  . No narrative on file    Objective:  Filed Vitals:   07/15/13 0806  BP: 122/74  Pulse: 71  Temp: 97.5 F (36.4 C)  Resp: 14     General appearance: alert, cooperative and appears stated age Ears: normal TM's and external ear canals both ears Throat: lips, mucosa, and tongue normal; teeth and gums normal Neck: no adenopathy, no carotid bruit, supple, symmetrical, trachea midline and thyroid not enlarged, symmetric, no tenderness/mass/nodules Back: symmetric, no curvature. ROM normal. No CVA tenderness. Lungs: clear to auscultation bilaterally Heart: regular rate and rhythm, S1, S2 normal, no murmur, click, rub or gallop Abdomen: soft, non-tender; bowel sounds normal; no masses,  no organomegaly Pulses: 2+ and symmetric Skin: Skin color, texture, turgor normal. No rashes or lesions Lymph nodes: Cervical, supraclavicular, and axillary nodes normal. Foot exam:  Nails are well trimmed,  No callouses,  Sensation intact to microfilament  Assessment and Plan:  DM (diabetes mellitus), type 2 with renal complications Well-controlled on current medications.  hemoglobin A1c has risen to 7.3.  She is up-to-date on eye exams and her foot exam is normal. Normal urine microalbumin to creatinine ratio . She is on the appropriate medications. Advised to take Atkins bars with her to avoid hypoglycemic events ,  And if recurrent will change glucotrol xl to short acting.      Unspecified essential hypertension Well controlled on current regimen. Renal function has declined for unclear reasons. Will repeat BMEt in a nonfasting state. .  Other and unspecified hyperlipidemia Well controlled on current statin therapy.   Liver enzymes are normal , no changes today.  Lab Results  Component Value Date   CHOL 205* 07/15/2013    HDL 82.20 07/15/2013   LDLCALC 90 11/05/2012   LDLDIRECT 98.7 07/15/2013   TRIG 144.0 07/15/2013   CHOLHDL 2 07/15/2013    Obesity, unspecified I have addressed  BMI and recommended wt loss of 10% of body weigh over the next 6 months using a low glycemic index diet and regular exercise a minimum of 5 days per week.     Updated Medication List Outpatient Encounter Prescriptions as of 07/15/2013  Medication Sig Dispense Refill  . albuterol (PROVENTIL HFA;VENTOLIN HFA) 108 (90 BASE) MCG/ACT inhaler Inhale 2 puffs into the lungs every 6 (six) hours as needed for wheezing.      Marland Kitchen aspirin 81 MG tablet Take 81 mg by mouth daily.      Marland Kitchen atorvastatin (LIPITOR) 20 MG tablet Take 1 tablet (20 mg total) by mouth daily.  90 tablet  1  . azelastine (ASTEPRO) 137 MCG/SPRAY nasal spray Place 1 spray into the nose 2 (two) times daily. Use in each nostril as directed  30 mL  11  . BIOTIN PO Take by mouth. Take 1000mg  by mouth twice daily.      . Black Cohosh (REMIFEMIN) 20 MG TABS Take 2 tablets by mouth daily.      . Calcium Carbonate-Vitamin D (CALCIUM 600 + D PO) Take 1 tablet by mouth daily.      . celecoxib (CELEBREX) 200 MG capsule Take 200 mg by mouth daily.      . Cholecalciferol (D3-1000 PO) Take 2 tablets in the am and 1 tablet in the pm.      . diclofenac sodium (VOLTAREN) 1 % GEL Apply 2 g topically 4 (four) times daily.      . fish oil-omega-3 fatty acids 1000 MG capsule Take 1 capsule by mouth daily.      . flurandrenolide (CORDRAN) 0.05 % lotion Apply topically 2 (two) times daily. GENERIC PLEASE  60 mL  0  . fluticasone (FLONASE) 50 MCG/ACT nasal spray Place 1 spray into the nose daily.  16 g  11  . furosemide (LASIX) 80 MG tablet Take 1 tablet (80 mg total) by mouth daily.  90 tablet  1  . glipiZIDE (GLUCOTROL XL) 10 MG 24 hr tablet Take 1 tablet (10 mg total) by mouth daily.  90 tablet  1  . lisinopril (PRINIVIL,ZESTRIL) 20 MG tablet Take 1 tablet (20 mg total) by mouth daily.  90 tablet   1  . Meclizine HCl 25 MG CHEW Chew by mouth.      . metformin (FORTAMET) 500 MG (OSM) 24 hr tablet Take 2 tablets (1,000 mg total) by mouth 2 (two) times daily with a meal.  360 tablet  3  . Multiple Vitamin (MULTIVITAMIN) tablet Take 1 tablet by mouth daily.      Marland Kitchen omeprazole (PRILOSEC) 20 MG capsule Take 1 capsule (20 mg total)  by mouth daily.  90 capsule  1  . Potassium 99 MG TABS Take 1 tablet by mouth daily.      . sertraline (ZOLOFT) 50 MG tablet Take 1 tablet (50 mg total) by mouth daily.  30 tablet  5  . verapamil (VERELAN PM) 180 MG 24 hr capsule Take 1 capsule (180 mg total) by mouth daily.  90 capsule  1  . vitamin C (ASCORBIC ACID) 500 MG tablet Take 500 mg by mouth daily.      Marland Kitchen HYDROcodone-acetaminophen (VICODIN) 5-500 MG per tablet Take 1 tablet by mouth every 8 (eight) hours as needed for pain.      Marland Kitchen zoster vaccine live, PF, (ZOSTAVAX) 21308 UNT/0.65ML injection Inject 19,400 Units into the skin once.  1 each  0   No facility-administered encounter medications on file as of 07/15/2013.

## 2013-07-15 NOTE — Assessment & Plan Note (Signed)
Well controlled on current regimen. Renal function has declined for unclear reasons. Will repeat BMEt in a nonfasting state. Marland Kitchen

## 2013-07-15 NOTE — Assessment & Plan Note (Signed)
I have addressed  BMI and recommended wt loss of 10% of body weigh over the next 6 months using a low glycemic index diet and regular exercise a minimum of 5 days per week.   

## 2013-07-15 NOTE — Patient Instructions (Signed)
You can check with your insurance about the Shingles vaccine. If they will not cover it , you can use the prescription to get it done at the health Dept or a local pharmacy  Return in 3 monhts for your annual PE

## 2013-07-15 NOTE — Addendum Note (Signed)
Addended by: Sherlene Shams on: 07/15/2013 10:39 PM   Modules accepted: Orders

## 2013-07-15 NOTE — Assessment & Plan Note (Signed)
Well controlled on current statin therapy.   Liver enzymes are normal , no changes today.  Lab Results  Component Value Date   CHOL 205* 07/15/2013   HDL 82.20 07/15/2013   LDLCALC 90 11/05/2012   LDLDIRECT 98.7 07/15/2013   TRIG 144.0 07/15/2013   CHOLHDL 2 07/15/2013

## 2013-07-22 ENCOUNTER — Other Ambulatory Visit (INDEPENDENT_AMBULATORY_CARE_PROVIDER_SITE_OTHER): Payer: Medicare PPO

## 2013-07-22 DIAGNOSIS — E1149 Type 2 diabetes mellitus with other diabetic neurological complication: Secondary | ICD-10-CM

## 2013-07-22 DIAGNOSIS — E1142 Type 2 diabetes mellitus with diabetic polyneuropathy: Secondary | ICD-10-CM

## 2013-07-22 DIAGNOSIS — N182 Chronic kidney disease, stage 2 (mild): Secondary | ICD-10-CM

## 2013-07-22 LAB — BASIC METABOLIC PANEL
BUN: 21 mg/dL (ref 6–23)
CO2: 28 mEq/L (ref 19–32)
Calcium: 9.7 mg/dL (ref 8.4–10.5)
Chloride: 102 mEq/L (ref 96–112)
Creatinine, Ser: 1.2 mg/dL (ref 0.4–1.2)
GFR: 47.3 mL/min — ABNORMAL LOW (ref >60.00)
Glucose, Bld: 130 mg/dL — ABNORMAL HIGH (ref 70–99)
Potassium: 4.1 mEq/L (ref 3.5–5.1)
Sodium: 141 mEq/L (ref 135–145)

## 2013-07-23 ENCOUNTER — Encounter: Payer: Self-pay | Admitting: Internal Medicine

## 2013-07-24 ENCOUNTER — Other Ambulatory Visit: Payer: Self-pay | Admitting: *Deleted

## 2013-07-24 MED ORDER — VERAPAMIL HCL ER 180 MG PO CP24: 180.0000 mg | ORAL_CAPSULE | Freq: Every day | ORAL | Status: DC

## 2013-07-24 MED ORDER — FLUTICASONE PROPIONATE 50 MCG/ACT NA SUSP: 1.0000 | Freq: Every day | NASAL | Status: DC

## 2013-07-24 MED ORDER — SERTRALINE HCL 50 MG PO TABS: 50.0000 mg | ORAL_TABLET | Freq: Every day | ORAL | Status: DC

## 2013-07-24 MED ORDER — METFORMIN HCL ER (OSM) 500 MG PO TB24: 1000.0000 mg | ORAL_TABLET | Freq: Two times a day (BID) | ORAL | Status: DC

## 2013-07-24 MED ORDER — ATORVASTATIN CALCIUM 20 MG PO TABS: 20.0000 mg | ORAL_TABLET | Freq: Every day | ORAL | Status: DC

## 2013-07-30 ENCOUNTER — Telehealth: Payer: Self-pay | Admitting: *Deleted

## 2013-07-31 NOTE — Telephone Encounter (Signed)
The patient called per Olegario Messier would like to know if she is to be on 1000 mg of Metformin or 500 mg and if so a new prescription needs to be called into the pharmacy.

## 2013-07-31 NOTE — Telephone Encounter (Signed)
Left message for pt to return my call.

## 2013-07-31 NOTE — Telephone Encounter (Signed)
Her chart indicates that the dose is 1000 mg twice dailyusing  500 mg tablets so she is getting #360/90 days which is  4 tablets daily .  Please ask her to check her bottle

## 2013-08-01 ENCOUNTER — Other Ambulatory Visit: Payer: Self-pay

## 2013-08-01 NOTE — Telephone Encounter (Signed)
Spoke with pt, verbalized understanding.

## 2013-09-20 ENCOUNTER — Other Ambulatory Visit: Payer: Self-pay | Admitting: Internal Medicine

## 2013-10-04 ENCOUNTER — Telehealth: Payer: Self-pay | Admitting: Internal Medicine

## 2013-10-04 DIAGNOSIS — M25571 Pain in right ankle and joints of right foot: Secondary | ICD-10-CM

## 2013-10-04 NOTE — Telephone Encounter (Signed)
Please advise 

## 2013-10-04 NOTE — Telephone Encounter (Signed)
The patient is requesting a referral to an Orthopedic specialist ([facility])  for her right  Foot. She broke her foot at the arch 5 years ago. Now she is having pain when she walks.

## 2013-10-04 NOTE — Telephone Encounter (Signed)
They may suggest a podiatrist instead but we'll start with [facility]

## 2013-10-07 ENCOUNTER — Encounter: Payer: Self-pay | Admitting: Emergency Medicine

## 2013-10-07 NOTE — Telephone Encounter (Signed)
Left message for patient to call office.  

## 2013-10-08 ENCOUNTER — Telehealth: Payer: Self-pay | Admitting: Internal Medicine

## 2013-10-08 NOTE — Telephone Encounter (Signed)
Last Pneumonia was 2005  Please advise, and would like script for Zoster Vaccine.

## 2013-10-08 NOTE — Telephone Encounter (Signed)
Patient notified and is waiting for insurance approval.

## 2013-10-08 NOTE — Telephone Encounter (Signed)
Pt sent my chart message  See below  I would like to get my pnuemonia shot when I come in to see the doctor on the 26th. I had my flu    shot in August. I need a prescription for my zoster vaccine, Zostavax.    Thank you Arthor Captain

## 2013-10-18 MED ORDER — ZOSTER VACCINE LIVE 19400 UNT/0.65ML ~~LOC~~ SOLR: 0.6500 mL | Freq: Once | SUBCUTANEOUS | Status: DC

## 2013-10-18 NOTE — Telephone Encounter (Signed)
Called patient was advised to hold for office visit on Monday.

## 2013-10-18 NOTE — Telephone Encounter (Signed)
Yes she can have the Prevnar vaccine. Zostavax vaccine printed.

## 2013-10-21 ENCOUNTER — Ambulatory Visit (INDEPENDENT_AMBULATORY_CARE_PROVIDER_SITE_OTHER): Payer: Medicare PPO | Admitting: Internal Medicine

## 2013-10-21 ENCOUNTER — Encounter: Payer: Self-pay | Admitting: Internal Medicine

## 2013-10-21 VITALS — BP 106/68 | HR 66 | Temp 97.5°F | Ht 61.0 in | Wt 171.8 lb

## 2013-10-21 DIAGNOSIS — I10 Essential (primary) hypertension: Secondary | ICD-10-CM

## 2013-10-21 DIAGNOSIS — E785 Hyperlipidemia, unspecified: Secondary | ICD-10-CM

## 2013-10-21 DIAGNOSIS — R5383 Other fatigue: Secondary | ICD-10-CM

## 2013-10-21 DIAGNOSIS — R5381 Other malaise: Secondary | ICD-10-CM

## 2013-10-21 DIAGNOSIS — Z23 Encounter for immunization: Secondary | ICD-10-CM

## 2013-10-21 DIAGNOSIS — E559 Vitamin D deficiency, unspecified: Secondary | ICD-10-CM

## 2013-10-21 DIAGNOSIS — K2270 Barrett's esophagus without dysplasia: Secondary | ICD-10-CM

## 2013-10-21 DIAGNOSIS — E1129 Type 2 diabetes mellitus with other diabetic kidney complication: Secondary | ICD-10-CM

## 2013-10-21 DIAGNOSIS — Z0000 Encounter for general adult medical examination without abnormal findings: Secondary | ICD-10-CM | POA: Insufficient documentation

## 2013-10-21 LAB — COMPREHENSIVE METABOLIC PANEL
ALT: 20 U/L (ref 0–35)
AST: 22 U/L (ref 0–37)
Albumin: 4.1 g/dL (ref 3.5–5.2)
Alkaline Phosphatase: 109 U/L (ref 39–117)
BUN: 32 mg/dL — ABNORMAL HIGH (ref 6–23)
CO2: 27 mEq/L (ref 19–32)
Calcium: 9.5 mg/dL (ref 8.4–10.5)
Chloride: 101 mEq/L (ref 96–112)
Creatinine, Ser: 1.1 mg/dL (ref 0.4–1.2)
GFR: 51.72 mL/min — ABNORMAL LOW (ref >60.00)
Glucose, Bld: 151 mg/dL — ABNORMAL HIGH (ref 70–99)
Potassium: 4.3 mEq/L (ref 3.5–5.1)
Sodium: 137 mEq/L (ref 135–145)
Total Bilirubin: 0.5 mg/dL (ref 0.3–1.2)
Total Protein: 7.6 g/dL (ref 6.0–8.3)

## 2013-10-21 LAB — LIPID PANEL
Cholesterol: 200 mg/dL (ref 0–200)
HDL: 82.5 mg/dL (ref >39.00)
LDL Cholesterol: 99 mg/dL (ref 0–99)
Total CHOL/HDL Ratio: 2
Triglycerides: 94 mg/dL (ref 0.0–149.0)
VLDL: 18.8 mg/dL (ref 0.0–40.0)

## 2013-10-21 LAB — TSH: TSH: 1.93 u[IU]/mL (ref 0.35–5.50)

## 2013-10-21 LAB — HEMOGLOBIN A1C: Hgb A1c MFr Bld: 7.6 % — ABNORMAL HIGH (ref 4.6–6.5)

## 2013-10-21 LAB — MICROALBUMIN / CREATININE URINE RATIO
Creatinine,U: 94.1 mg/dL
Microalb Creat Ratio: 0.3 mg/g (ref 0.0–30.0)
Microalb, Ur: 0.3 mg/dL (ref 0.0–1.9)

## 2013-10-21 MED ORDER — ZOSTER VACCINE LIVE 19400 UNT/0.65ML ~~LOC~~ SOLR: 0.6500 mL | Freq: Once | SUBCUTANEOUS | Status: DC

## 2013-10-21 MED ORDER — TETANUS-DIPHTH-ACELL PERTUSSIS 5-2.5-18.5 LF-MCG/0.5 IM SUSP: 0.5000 mL | Freq: Once | INTRAMUSCULAR | Status: DC

## 2013-10-21 NOTE — Patient Instructions (Addendum)
You had your annual Medicare wellness exam today   You are due for a TDaP vaccine and a Shingles vaccine.  I have given you prescriptions for thses because they will be cheaper at the health Dept or at your  local pharmacy because Medicare will not reimburse for them.   You received the pneumonia vaccine today.  We will contact you with the bloodwork results.  If you A1c is > 7.0,  We may need to add a second medication for diabetes,  Depending on your blood sugars.    I want you to lose 13 lbs over the next year to get BMI < 30.   This is  One version of a  "Low GI"  Diet:  It will still lower your blood sugars and allow you to lose 4 to 8  lbs  per month if you follow it carefully.  Your goal with exercise is a minimum of 30 minutes of aerobic exercise 5 days per week (Walking does not count once it becomes easy!)    All of the foods can be found at grocery stores and in bulk at [employer].  The Atkins protein bars and shakes are available in more varieties at [employer], [employer] and [place].     7 AM Breakfast:  Choose from the following:  Low carbohydrate Protein  Shakes (I recommend the EAS AdvantEdge "Carb Control" shakes  Or the low carb shakes by Atkins.    2.5 carbs   Arnold's "Sandwhich Thin"toasted  w/ peanut butter (no jelly: about 20 net carbs  "Bagel Thin" with cream cheese and salmon: about 20 carbs   a scrambled egg/bacon/cheese burrito made with Mission's "carb balance" whole wheat tortilla  (about 10 net carbs )   Avoid cereal and bananas, oatmeal and cream of wheat and grits. They are loaded with carbohydrates!   10 AM: high protein snack  Protein bar by Atkins (the snack size, under 200 cal, usually < 6 net carbs).    A stick of cheese:  Around 1 carb,  100 cal     Dannon Light n Fit [place] Yogurt  (80 cal, 8 carbs)  Other so called "protein bars" and Greek yogurts tend to be loaded with carbohydrates.  Remember, in food advertising, the word "energy" is synonymous  for " carbohydrate."  Lunch:   A Sandwich using the bread choices listed, Can use any  Eggs,  lunchmeat, grilled meat or canned tuna), avocado, regular mayo/mustard  and cheese.  A Salad using blue cheese, ranch,  Goddess or vinagrette,  No croutons or "confetti" and no "candied nuts" but regular nuts OK.   No pretzels or chips.  Pickles and miniature sweet peppers are a good low carb alternative that provide a "crunch"  The bread is the only source of carbohydrate in a sandwich and  can be decreased by trying some of these alternatives to traditional loaf bread  Joseph's makes a pita bread and a flat bread that are 50 cal and 4 net carbs available at [place] and [place].  This can be toasted to use with hummous as well  Toufayan makes a low carb flatbread that's 100 cal and 9 net carbs available at [employer] and [employer]'s makes 2 sizes of  Low carb whole wheat tortilla  (The large one is 210 cal and 6 net carbs) Avoid "Low fat dressings, as well as [place] dressings They are loaded with sugar!   3 PM/ Mid  day  Snack:  Consider  1 ounce of  almonds, walnuts, pistachios, pecans, peanuts,  Macadamia nuts or a nut medley.  Avoid "granola"; the dried cranberries and raisins are loaded with carbohydrates. Mixed nuts as long as there are no raisins,  cranberries or dried fruit.     6 PM  Dinner:     Meat/fowl/fish with a green salad, and either broccoli, cauliflower, green beans, spinach, brussel sprouts or  Lima beans. DO NOT BREAD THE PROTEIN!!      There is a low carb pasta by Dreamfield's that is acceptable and tastes great: only 5 digestible carbs/serving.( All grocery stores but BJs carry it )  Try Hurley Cisco Angelo's chicken piccata or chicken or eggplant parm over low carb pasta.(Lowes and BJs)   Marjory Lies Sanchez's "Carnitas" (pulled pork, no sauce,  0 carbs) or his beef pot roast to make a dinner burrito (at [employer]J's)  Pesto over low carb pasta (bj's sells a good quality pesto in  the center refrigerated section of the deli   Whole wheat pasta is still full of digestible carbs and  Not as low in glycemic index as Dreamfield's.   Brown rice is still rice,  So skip the rice and noodles if you eat [place] or [place] (or at least limit to 1/2 cup)  9 PM snack :   Breyer's "low carb" fudgsicle or  ice cream bar (Carb Smart line), or  Weight Watcher's ice cream bar , or another "no sugar added" ice cream;  a serving of fresh berries/cherries with whipped cream   Cheese or DANNON'S LlGHT N FIT [place] YOGURT  Avoid bananas, pineapple, grapes  and watermelon on a regular basis because they are high in sugar.  THINK OF THEM AS DESSERT  Remember that snack Substitutions should be less than 10 NET carbs per serving and meals < 20 carbs. Remember to subtract fiber grams to get the "net carbs."

## 2013-10-21 NOTE — Assessment & Plan Note (Signed)
Annual comprehensive exam was done including breast, excluding pelvic and PAP smear. All screenings have been addressed .  

## 2013-10-21 NOTE — Assessment & Plan Note (Addendum)
hemoglobin A1c has been climbing steadily and now 7.6 . She is up-to-date on eye exams and her foot exam is normal.  she is on the appropriate medications. We have already increased the metformin and glizipizde to maxilmal therapeutic dose and the next option is to add Januvia or another comparable oral gliptan or intensify diet and exercise.  Will have patient check blood sugars daily for two weeks and review .

## 2013-10-21 NOTE — Progress Notes (Signed)
Pre-visit discussion using our clinic review tool. No additional management support is needed unless otherwise documented below in the visit note.  

## 2013-10-21 NOTE — Assessment & Plan Note (Signed)
Well controlled on current statin therapy.   Liver enzymes are normal , no changes today.  Lab Results  Component Value Date   CHOL 200 10/21/2013   HDL 82.50 10/21/2013   LDLCALC 99 10/21/2013   LDLDIRECT 98.7 07/15/2013   TRIG 94.0 10/21/2013   CHOLHDL 2 10/21/2013   Lab Results  Component Value Date   ALT 20 10/21/2013   AST 22 10/21/2013   ALKPHOS 109 10/21/2013   BILITOT 0.5 10/21/2013

## 2013-10-21 NOTE — Assessment & Plan Note (Signed)
Continue daily PPI and regular surveillance with EGD

## 2013-10-21 NOTE — Progress Notes (Signed)
Patient ID: Sandra Hendrix, female   DOB: 07/16/1946, 68 y.o.   MRN: [id number]  The patient is here for annual Medicare wellness examination and management of other chronic and acute problems.   The risk factors are reflected in the social history.  The roster of all physicians providing medical care to patient - is listed in the Snapshot section of the chart.  Activities of daily living:  The patient is 100% independent in all ADLs: dressing, toileting, feeding as well as independent mobility  Home safety : The patient has smoke detectors in the home. They wear seatbelts.  There are no firearms at home. There is no violence in the home.   There is no risks for hepatitis, STDs or HIV. There is no   history of blood transfusion. They have no travel history to infectious disease endemic areas of the world.  The patient has seen their dentist in the last six month. They have seen their eye doctor in the last year. They admit to slight hearing difficulty with regard to whispered voices and some television programs.  They have deferred audiologic testing in the last year.  They do not  have excessive sun exposure. Discussed the need for sun protection: hats, long sleeves and use of sunscreen if there is significant sun exposure.   Diet: the importance of a healthy diet is discussed. They do have a healthy diet.  The benefits of regular aerobic exercise were discussed. She walks 4 times per week ,  20 minutes.   Depression screen: there are no signs or vegative symptoms of depression- irritability, change in appetite, anhedonia, sadness/tearfullness.  Cognitive assessment: the patient manages all their financial and personal affairs and is actively engaged. They could relate day,date,year and events; recalled 2/3 objects at 3 minutes; performed clock-face test normally.  The following portions of the patient's history were reviewed and updated as appropriate: allergies, current medications, past  family history, past medical history,  past surgical history, past social history  and problem list.  Visual acuity was not assessed per patient preference since she has regular follow up with her ophthalmologist. Hearing and body mass index were assessed and reviewed.   During the course of the visit the patient was educated and counseled about appropriate screening and preventive services including : fall prevention , diabetes screening, nutrition counseling, colorectal cancer screening, and recommended immunizations.    Objective:  BP 106/68  Pulse 66  Temp(Src) 97.5 F (36.4 C) (Oral)  Ht 5\' 1"  (1.549 m)  Wt 171 lb 12.8 oz (77.928 kg)  BMI 32.48 kg/m2  SpO2 97%  General Appearance:    Alert, cooperative, no distress, appears stated age  Head:    Normocephalic, without obvious abnormality, atraumatic  Eyes:    PERRL, conjunctiva/corneas clear, EOM's intact, fundi    benign, both eyes  Ears:    Normal TM's and external ear canals, both ears  Nose:   Nares normal, septum midline, mucosa normal, no drainage    or sinus tenderness  Throat:   Lips, mucosa, and tongue normal; teeth and gums normal  Neck:   Supple, symmetrical, trachea midline, no adenopathy;    thyroid:  no enlargement/tenderness/nodules; no carotid   bruit or JVD  Back:     Symmetric, no curvature, ROM normal, no CVA tenderness  Lungs:     Clear to auscultation bilaterally, respirations unlabored  Chest Wall:    No tenderness or deformity   Heart:    Regular rate and rhythm,  S1 and S2 normal, no murmur, rub   or gallop  Breast Exam:    No tenderness, masses, or nipple abnormality  Abdomen:     Soft, non-tender, bowel sounds active all four quadrants,    no masses, no organomegaly  Extremities:   Extremities normal, atraumatic, no cyanosis or edema  Pulses:   2+ and symmetric all extremities  Skin:   Skin color, texture, turgor normal, no rashes or lesions  Lymph nodes:   Cervical, supraclavicular, and axillary  nodes normal  Neurologic:   CNII-XII intact, normal strength, sensation and reflexes    throughout    Assessment and Plan:  DM (diabetes mellitus), type 2 with renal complications   hemoglobin A1c has been climbing steadily and now 7.6 . She is up-to-date on eye exams and her foot exam is normal.  she is on the appropriate medications. We have already increased the metformin and glizipizde to maxilmal therapeutic dose and the next option is to add Januvia or another comparable oral gliptan or intensify diet and exercise.  Will have patient check blood sugars daily for two weeks and review .   Barrett's esophagus Continue daily PPI and regular surveillance with EGD   Other and unspecified hyperlipidemia Well controlled on current statin therapy.   Liver enzymes are normal , no changes today.  Lab Results  Component Value Date   CHOL 200 10/21/2013   HDL 82.50 10/21/2013   LDLCALC 99 10/21/2013   LDLDIRECT 98.7 07/15/2013   TRIG 94.0 10/21/2013   CHOLHDL 2 10/21/2013   Lab Results  Component Value Date   ALT 20 10/21/2013   AST 22 10/21/2013   ALKPHOS 109 10/21/2013   BILITOT 0.5 10/21/2013    Unspecified essential hypertension Well controlled on current regimen. Renal function stable, no changes today.  Lab Results  Component Value Date   CREATININE 1.1 10/21/2013   Lab Results  Component Value Date   NA 137 10/21/2013   K 4.3 10/21/2013   CL 101 10/21/2013   CO2 27 10/21/2013    Welcome to Medicare preventive visit Annual comprehensive exam was done including breast, excluding pelvic and PAP smear. All screenings have been addressed .    Updated Medication List Outpatient Encounter Prescriptions as of 10/21/2013  Medication Sig  . albuterol (PROVENTIL HFA;VENTOLIN HFA) 108 (90 BASE) MCG/ACT inhaler Inhale 2 puffs into the lungs every 6 (six) hours as needed for wheezing.  Marland Kitchen aspirin 81 MG tablet Take 81 mg by mouth daily.  Marland Kitchen atorvastatin (LIPITOR) 20 MG tablet Take 1 tablet  (20 mg total) by mouth daily.  Marland Kitchen azelastine (ASTEPRO) 137 MCG/SPRAY nasal spray Place 1 spray into the nose 2 (two) times daily. Use in each nostril as directed  . BIOTIN PO Take by mouth. Take 1000mg  by mouth twice daily.  . Black Cohosh (REMIFEMIN) 20 MG TABS Take 2 tablets by mouth daily.  . Calcium Carbonate-Vitamin D (CALCIUM 600 + D PO) Take 1 tablet by mouth daily.  . celecoxib (CELEBREX) 200 MG capsule Take 200 mg by mouth daily.  . Cholecalciferol (D3-1000 PO) Take 2 tablets in the am and 1 tablet in the pm.  . diclofenac sodium (VOLTAREN) 1 % GEL Apply 2 g topically 4 (four) times daily.  . fish oil-omega-3 fatty acids 1000 MG capsule Take 1 capsule by mouth daily.  . flurandrenolide (CORDRAN) 0.05 % lotion Apply topically 2 (two) times daily. GENERIC PLEASE  . fluticasone (FLONASE) 50 MCG/ACT nasal spray Place 1 spray into  the nose daily.  . furosemide (LASIX) 80 MG tablet Take 1 tablet (80 mg total) by mouth daily.  Marland Kitchen glipiZIDE (GLUCOTROL XL) 10 MG 24 hr tablet Take 1 tablet (10 mg total) by mouth daily.  Marland Kitchen HYDROcodone-acetaminophen (VICODIN) 5-500 MG per tablet Take 1 tablet by mouth every 8 (eight) hours as needed for pain.  Marland Kitchen lisinopril (PRINIVIL,ZESTRIL) 20 MG tablet Take 1 tablet (20 mg total) by mouth daily.  . Meclizine HCl 25 MG CHEW Chew by mouth.  . metformin (FORTAMET) 500 MG (OSM) 24 hr tablet Take 2 tablets (1,000 mg total) by mouth 2 (two) times daily with a meal.  . Multiple Vitamin (MULTIVITAMIN) tablet Take 1 tablet by mouth daily.  Marland Kitchen omeprazole (PRILOSEC) 20 MG capsule Take 1 capsule (20 mg total) by mouth daily.  . sertraline (ZOLOFT) 50 MG tablet TAKE 1 TABLET EVERY DAY  . verapamil (VERELAN PM) 180 MG 24 hr capsule Take 1 capsule (180 mg total) by mouth daily.  . vitamin C (ASCORBIC ACID) 500 MG tablet Take 500 mg by mouth daily.  Marland Kitchen zoster vaccine live, PF, (ZOSTAVAX) 16109 UNT/0.65ML injection Inject 19,400 Units into the skin once.  . zoster vaccine live, PF,  (ZOSTAVAX) 60454 UNT/0.65ML injection Inject 19,400 Units into the skin once.  . Potassium 99 MG TABS Take 1 tablet by mouth daily.  . Tdap (BOOSTRIX) 5-2.5-18.5 LF-MCG/0.5 injection Inject 0.5 mLs into the muscle once.  . zoster vaccine live, PF, (ZOSTAVAX) 09811 UNT/0.65ML injection Inject 19,400 Units into the skin once.

## 2013-10-21 NOTE — Progress Notes (Signed)
pc °

## 2013-10-21 NOTE — Assessment & Plan Note (Signed)
Well controlled on current regimen. Renal function stable, no changes today.  Lab Results  Component Value Date   CREATININE 1.1 10/21/2013   Lab Results  Component Value Date   NA 137 10/21/2013   K 4.3 10/21/2013   CL 101 10/21/2013   CO2 27 10/21/2013

## 2013-10-22 LAB — VITAMIN D 25 HYDROXY (VIT D DEFICIENCY, FRACTURES): Vit D, 25-Hydroxy: 74 ng/mL (ref 30–89)

## 2013-10-24 ENCOUNTER — Telehealth: Payer: Self-pay

## 2013-10-24 ENCOUNTER — Encounter: Payer: Self-pay | Admitting: Internal Medicine

## 2013-10-24 NOTE — Telephone Encounter (Signed)
Relevant patient education assigned to patient using Emmi. ° °

## 2013-10-30 ENCOUNTER — Telehealth: Payer: Self-pay | Admitting: Internal Medicine

## 2013-10-30 NOTE — Telephone Encounter (Signed)
Relevant patient education assigned to patient using Emmi. ° °

## 2013-12-17 ENCOUNTER — Other Ambulatory Visit: Payer: Self-pay | Admitting: Internal Medicine

## 2013-12-19 ENCOUNTER — Encounter: Payer: Self-pay | Admitting: Internal Medicine

## 2013-12-24 MED ORDER — OMEPRAZOLE 20 MG PO CPDR: 20.0000 mg | DELAYED_RELEASE_CAPSULE | Freq: Every day | ORAL | Status: DC

## 2013-12-24 MED ORDER — ATORVASTATIN CALCIUM 20 MG PO TABS: 20.0000 mg | ORAL_TABLET | Freq: Every day | ORAL | Status: DC

## 2013-12-24 MED ORDER — FLUTICASONE PROPIONATE 50 MCG/ACT NA SUSP: 1.0000 | Freq: Every day | NASAL | Status: DC

## 2013-12-24 MED ORDER — VERAPAMIL HCL ER 180 MG PO CP24: 180.0000 mg | ORAL_CAPSULE | Freq: Every day | ORAL | Status: DC

## 2013-12-24 MED ORDER — METFORMIN HCL ER (OSM) 500 MG PO TB24: 1000.0000 mg | ORAL_TABLET | Freq: Two times a day (BID) | ORAL | Status: DC

## 2013-12-24 MED ORDER — GLIPIZIDE ER 10 MG PO TB24: 10.0000 mg | ORAL_TABLET | Freq: Every day | ORAL | Status: DC

## 2013-12-24 MED ORDER — FUROSEMIDE 80 MG PO TABS: 80.0000 mg | ORAL_TABLET | Freq: Every day | ORAL | Status: DC

## 2013-12-24 MED ORDER — LISINOPRIL 20 MG PO TABS: ORAL_TABLET | ORAL | Status: DC

## 2013-12-24 MED ORDER — FLURANDRENOLIDE 0.05 % EX LOTN: TOPICAL_LOTION | Freq: Two times a day (BID) | CUTANEOUS | Status: DC

## 2013-12-24 NOTE — Telephone Encounter (Signed)
Please advise ok to fill zoloft.

## 2013-12-25 MED ORDER — SERTRALINE HCL 50 MG PO TABS: ORAL_TABLET | ORAL | Status: DC

## 2013-12-25 NOTE — Telephone Encounter (Signed)
zoloft refille d and printed

## 2013-12-26 DIAGNOSIS — K219 Gastro-esophageal reflux disease without esophagitis: Secondary | ICD-10-CM | POA: Insufficient documentation

## 2013-12-26 DIAGNOSIS — K581 Irritable bowel syndrome with constipation: Secondary | ICD-10-CM | POA: Insufficient documentation

## 2014-01-01 ENCOUNTER — Encounter: Payer: Self-pay | Admitting: Internal Medicine

## 2014-01-20 ENCOUNTER — Ambulatory Visit (INDEPENDENT_AMBULATORY_CARE_PROVIDER_SITE_OTHER): Payer: Medicare PPO | Admitting: Internal Medicine

## 2014-01-20 ENCOUNTER — Encounter: Payer: Self-pay | Admitting: Internal Medicine

## 2014-01-20 VITALS — BP 102/76 | HR 73 | Temp 97.7°F | Resp 16 | Wt 170.5 lb

## 2014-01-20 DIAGNOSIS — R32 Unspecified urinary incontinence: Secondary | ICD-10-CM

## 2014-01-20 DIAGNOSIS — E1129 Type 2 diabetes mellitus with other diabetic kidney complication: Secondary | ICD-10-CM

## 2014-01-20 DIAGNOSIS — R319 Hematuria, unspecified: Secondary | ICD-10-CM

## 2014-01-20 DIAGNOSIS — I10 Essential (primary) hypertension: Secondary | ICD-10-CM

## 2014-01-20 DIAGNOSIS — E669 Obesity, unspecified: Secondary | ICD-10-CM

## 2014-01-20 DIAGNOSIS — E785 Hyperlipidemia, unspecified: Secondary | ICD-10-CM

## 2014-01-20 LAB — URINALYSIS, ROUTINE W REFLEX MICROSCOPIC
Bilirubin Urine: NEGATIVE
Hgb urine dipstick: NEGATIVE
Ketones, ur: NEGATIVE
Nitrite: NEGATIVE
RBC / HPF: NONE SEEN (ref 0–?)
Specific Gravity, Urine: 1.02 (ref 1.000–1.030)
Total Protein, Urine: NEGATIVE
Urine Glucose: NEGATIVE
Urobilinogen, UA: 0.2 (ref 0.0–1.0)
pH: 6 (ref 5.0–8.0)

## 2014-01-20 LAB — COMPREHENSIVE METABOLIC PANEL
ALT: 19 U/L (ref 0–35)
AST: 22 U/L (ref 0–37)
Albumin: 4.3 g/dL (ref 3.5–5.2)
Alkaline Phosphatase: 109 U/L (ref 39–117)
BUN: 31 mg/dL — ABNORMAL HIGH (ref 6–23)
CO2: 26 mEq/L (ref 19–32)
Calcium: 9.2 mg/dL (ref 8.4–10.5)
Chloride: 100 mEq/L (ref 96–112)
Creatinine, Ser: 1.2 mg/dL (ref 0.4–1.2)
GFR: 49.61 mL/min — ABNORMAL LOW (ref >60.00)
Glucose, Bld: 122 mg/dL — ABNORMAL HIGH (ref 70–99)
Potassium: 3.4 mEq/L — ABNORMAL LOW (ref 3.5–5.1)
Sodium: 139 mEq/L (ref 135–145)
Total Bilirubin: 0.5 mg/dL (ref 0.3–1.2)
Total Protein: 7.7 g/dL (ref 6.0–8.3)

## 2014-01-20 LAB — LIPID PANEL
Cholesterol: 199 mg/dL (ref 0–200)
HDL: 86.6 mg/dL (ref >39.00)
LDL Cholesterol: 93 mg/dL (ref 0–99)
Total CHOL/HDL Ratio: 2
Triglycerides: 97 mg/dL (ref 0.0–149.0)
VLDL: 19.4 mg/dL (ref 0.0–40.0)

## 2014-01-20 LAB — MICROALBUMIN / CREATININE URINE RATIO
Creatinine,U: 118.4 mg/dL
Microalb Creat Ratio: 0.8 mg/g (ref 0.0–30.0)
Microalb, Ur: 1 mg/dL (ref 0.0–1.9)

## 2014-01-20 LAB — HEMOGLOBIN A1C: Hgb A1c MFr Bld: 7 % — ABNORMAL HIGH (ref 4.6–6.5)

## 2014-01-20 NOTE — Patient Instructions (Signed)
  You are doing well.   We will clal you with the results of your labs from today  Follow up in 3 months.Marland Kitchen

## 2014-01-20 NOTE — Assessment & Plan Note (Addendum)
hemoglobin A1c has  improved   Lab Results  Component Value Date   HGBA1C 7.0* 01/20/2014   7.6 . She is up-to-date on eye exams and her foot exam is normal.  she is on the appropriate medications.

## 2014-01-20 NOTE — Assessment & Plan Note (Signed)
Well controlled on current statin therapy.   Liver enzymes are normal , no changes today.  Lab Results  Component Value Date   CHOL 199 01/20/2014   HDL 86.60 01/20/2014   LDLCALC 93 01/20/2014   LDLDIRECT 98.7 07/15/2013   TRIG 97.0 01/20/2014   CHOLHDL 2 01/20/2014   Lab Results  Component Value Date   ALT 19 01/20/2014   AST 22 01/20/2014   ALKPHOS 109 01/20/2014   BILITOT 0.5 01/20/2014

## 2014-01-20 NOTE — Assessment & Plan Note (Signed)
I have congratulated her in reduction of   BMI and encouraged  Continued weight loss with goal of 10% of body weigh over the next 6 months using a low glycemic index diet and regular exercise a minimum of 5 days per week.    

## 2014-01-20 NOTE — Progress Notes (Signed)
Patient ID: Sandra Hendrix, female   DOB: 07-06-1946, 68 y.o.   MRN: [id number]  Patient Active Problem List   Diagnosis Date Noted  . Welcome to Medicare preventive visit 10/21/2013  . Other and unspecified hyperlipidemia 07/15/2013  . Obesity, unspecified 07/15/2013  . Chronic venous insufficiency 03/21/2013  . DM (diabetes mellitus), type 2 with renal complications 44/11/4740  . Lack of libido 11/05/2012  . Barrett's esophagus 11/05/2012  . Hiatal hernia 11/05/2012  . Unspecified essential hypertension 11/05/2012  . Urinary incontinence     Subjective:  CC:   Chief Complaint  Patient presents with  . Follow-up  . Diabetes    HPI:   Sandra Hendrix is a 68 y.o. female who presents for Follow up on DM, hyperlipidemia and hypertension .  She feels generally well and is taking her medications as directed without adverse effects.  Lab Results  Component Value Date   HGBA1C 7.6* 10/21/2013   Lab Results  Component Value Date   LDLCALC 99 10/21/2013   Lab Results  Component Value Date   CREATININE 1.1 10/21/2013   Missed her medications for a week while husband was in the hospital having hernia surgery,  But since resuming her medication, her fastings have been averaging 106.  Last eye exam Oct 2016.  Had a  Low blood sugar caused  by skipping lunch around 5:30 pm while feeding the homeless.    Has been dealing with occasional Constipation  Managed with prn use of fiber supplements     Past Medical History  Diagnosis Date  . Diabetes mellitus without complication   . Hyperlipidemia   . Hypertension   . Arthritis   . Depression   . GERD (gastroesophageal reflux disease)   . Allergy   . Urinary incontinence   . Varicose veins     Past Surgical History  Procedure Laterality Date  . Joint replacement Right 2007  . Joint replacement Left 2004       The following portions of the patient's history were reviewed and updated as appropriate: Allergies, current  medications, and problem list.    Review of Systems:   Patient denies headache, fevers, malaise, unintentional weight loss, skin rash, eye pain, sinus congestion and sinus pain, sore throat, dysphagia,  hemoptysis , cough, dyspnea, wheezing, chest pain, palpitations, orthopnea, edema, abdominal pain, nausea, melena, diarrhea, constipation, flank pain, dysuria, hematuria, urinary  Frequency, nocturia, numbness, tingling, seizures,  Focal weakness, Loss of consciousness,  Tremor, insomnia, depression, anxiety, and suicidal ideation.     History   Social History  . Marital Status: Married    Spouse Name: N/A    Number of Children: N/A  . Years of Education: N/A   Occupational History  . Not on file.   Social History Main Topics  . Smoking status: Never Smoker   . Smokeless tobacco: Not on file  . Alcohol Use: No  . Drug Use: No  . Sexual Activity: Not on file   Other Topics Concern  . Not on file   Social History Narrative  . No narrative on file    Objective:  Filed Vitals:   01/20/14 0933  BP: 102/76  Pulse: 73  Temp: 97.7 F (36.5 C)  Resp: 16     General appearance: alert, cooperative and appears stated age Ears: normal TM's and external ear canals both ears Throat: lips, mucosa, and tongue normal; teeth and gums normal Neck: no adenopathy, no carotid bruit, supple, symmetrical, trachea midline and thyroid not enlarged,  symmetric, no tenderness/mass/nodules Back: symmetric, no curvature. ROM normal. No CVA tenderness. Lungs: clear to auscultation bilaterally Heart: regular rate and rhythm, S1, S2 normal, no murmur, click, rub or gallop Abdomen: soft, non-tender; bowel sounds normal; no masses,  no organomegaly Pulses: 2+ and symmetric Skin: Skin color, texture, turgor normal. No rashes or lesions Lymph nodes: Cervical, supraclavicular, and axillary nodes normal. Foot exam:  Nails are well trimmed,  No callouses,  Sensation intact to  microfilament   Assessment and Plan:  DM (diabetes mellitus), type 2 with renal complications   hemoglobin A1c has  improved   Lab Results  Component Value Date   HGBA1C 7.0* 01/20/2014   7.6 . She is up-to-date on eye exams and her foot exam is normal.  she is on the appropriate medications.   Other and unspecified hyperlipidemia Well controlled on current statin therapy.   Liver enzymes are normal , no changes today.  Lab Results  Component Value Date   CHOL 199 01/20/2014   HDL 86.60 01/20/2014   LDLCALC 93 01/20/2014   LDLDIRECT 98.7 07/15/2013   TRIG 97.0 01/20/2014   CHOLHDL 2 01/20/2014   Lab Results  Component Value Date   ALT 19 01/20/2014   AST 22 01/20/2014   ALKPHOS 109 01/20/2014   BILITOT 0.5 01/20/2014      Unspecified essential hypertension Well controlled on current regimen. Renal function stable, no changes today.  Lab Results  Component Value Date   CREATININE 1.2 01/20/2014   Lab Results  Component Value Date   NA 139 01/20/2014   K 3.4* 01/20/2014   CL 100 01/20/2014   CO2 26 01/20/2014      Obesity, unspecified I have congratulated her in reduction of   BMI and encouraged  Continued weight loss with goal of 10% of body weigh over the next 6 months using a low glycemic index diet and regular exercise a minimum of 5 days per week.     Updated Medication List Outpatient Encounter Prescriptions as of 01/20/2014  Medication Sig  . albuterol (PROVENTIL HFA;VENTOLIN HFA) 108 (90 BASE) MCG/ACT inhaler Inhale 2 puffs into the lungs every 6 (six) hours as needed for wheezing.  Marland Kitchen aspirin 81 MG tablet Take 81 mg by mouth daily.  Marland Kitchen atorvastatin (LIPITOR) 20 MG tablet Take 1 tablet (20 mg total) by mouth daily.  Marland Kitchen azelastine (ASTEPRO) 137 MCG/SPRAY nasal spray Place 1 spray into the nose 2 (two) times daily. Use in each nostril as directed  . BIOTIN PO Take by mouth. Take 1000mg  by mouth twice daily.  . Black Cohosh (REMIFEMIN) 20 MG TABS Take 2 tablets by  mouth daily.  . Calcium Carbonate-Vitamin D (CALCIUM 600 + D PO) Take 1 tablet by mouth daily.  . celecoxib (CELEBREX) 200 MG capsule Take 200 mg by mouth daily.  . Cholecalciferol (D3-1000 PO) Take 2 tablets in the am and 1 tablet in the pm.  . fish oil-omega-3 fatty acids 1000 MG capsule Take 1 capsule by mouth daily.  . fluticasone (FLONASE) 50 MCG/ACT nasal spray Place 1 spray into both nostrils daily.  . furosemide (LASIX) 80 MG tablet Take 1 tablet (80 mg total) by mouth daily.  Marland Kitchen glipiZIDE (GLUCOTROL XL) 10 MG 24 hr tablet Take 1 tablet (10 mg total) by mouth daily.  Marland Kitchen lisinopril (PRINIVIL,ZESTRIL) 20 MG tablet TAKE 1 TABLET EVERY DAY  . Meclizine HCl 25 MG CHEW Chew by mouth.  . metformin (FORTAMET) 500 MG (OSM) 24 hr tablet Take 2  tablets (1,000 mg total) by mouth 2 (two) times daily with a meal.  . Multiple Vitamin (MULTIVITAMIN) tablet Take 1 tablet by mouth daily.  Marland Kitchen omeprazole (PRILOSEC) 20 MG capsule Take 1 capsule (20 mg total) by mouth daily.  . Potassium 99 MG TABS Take 1 tablet by mouth daily.  . sertraline (ZOLOFT) 50 MG tablet TAKE 1 TABLET EVERY DAY  . verapamil (VERELAN PM) 180 MG 24 hr capsule Take 1 capsule (180 mg total) by mouth daily.  . vitamin C (ASCORBIC ACID) 500 MG tablet Take 500 mg by mouth daily.  . [DISCONTINUED] Tdap (BOOSTRIX) 5-2.5-18.5 LF-MCG/0.5 injection Inject 0.5 mLs into the muscle once.  . diclofenac sodium (VOLTAREN) 1 % GEL Apply 2 g topically 4 (four) times daily.  . flurandrenolide (CORDRAN) 0.05 % lotion Apply topically 2 (two) times daily. GENERIC PLEASE  . [DISCONTINUED] HYDROcodone-acetaminophen (VICODIN) 5-500 MG per tablet Take 1 tablet by mouth every 8 (eight) hours as needed for pain.  . [DISCONTINUED] zoster vaccine live, PF, (ZOSTAVAX) 94765 UNT/0.65ML injection Inject 19,400 Units into the skin once.  . [DISCONTINUED] zoster vaccine live, PF, (ZOSTAVAX) 46503 UNT/0.65ML injection Inject 19,400 Units into the skin once.  .  [DISCONTINUED] zoster vaccine live, PF, (ZOSTAVAX) 54656 UNT/0.65ML injection Inject 19,400 Units into the skin once.     Orders Placed This Encounter  Procedures  . Urinalysis, Routine w reflex microscopic  . Urine Microalbumin w/creat. ratio  . Hemoglobin A1c  . Lipid panel  . Comprehensive metabolic panel    No Follow-up on file.

## 2014-01-20 NOTE — Assessment & Plan Note (Signed)
Well controlled on current regimen. Renal function stable, no changes today.  Lab Results  Component Value Date   CREATININE 1.2 01/20/2014   Lab Results  Component Value Date   NA 139 01/20/2014   K 3.4* 01/20/2014   CL 100 01/20/2014   CO2 26 01/20/2014

## 2014-01-21 ENCOUNTER — Encounter: Payer: Self-pay | Admitting: Internal Medicine

## 2014-01-24 ENCOUNTER — Other Ambulatory Visit (INDEPENDENT_AMBULATORY_CARE_PROVIDER_SITE_OTHER): Payer: Medicare PPO

## 2014-01-24 DIAGNOSIS — N390 Urinary tract infection, site not specified: Secondary | ICD-10-CM

## 2014-01-24 LAB — URINALYSIS
Bilirubin Urine: NEGATIVE
Hgb urine dipstick: NEGATIVE
Ketones, ur: NEGATIVE
Leukocytes, UA: NEGATIVE
Nitrite: NEGATIVE
Specific Gravity, Urine: 1.015 (ref 1.000–1.030)
Total Protein, Urine: NEGATIVE
Urine Glucose: NEGATIVE
Urobilinogen, UA: 0.2 (ref 0.0–1.0)
pH: 6 (ref 5.0–8.0)

## 2014-01-25 ENCOUNTER — Encounter: Payer: Self-pay | Admitting: Internal Medicine

## 2014-01-26 LAB — URINE CULTURE
Colony Count: NO GROWTH
Organism ID, Bacteria: NO GROWTH

## 2014-02-05 ENCOUNTER — Telehealth: Payer: Self-pay | Admitting: Internal Medicine

## 2014-02-05 ENCOUNTER — Ambulatory Visit (INDEPENDENT_AMBULATORY_CARE_PROVIDER_SITE_OTHER): Payer: Medicare PPO | Admitting: Internal Medicine

## 2014-02-05 ENCOUNTER — Encounter: Payer: Self-pay | Admitting: Internal Medicine

## 2014-02-05 VITALS — BP 118/68 | HR 79 | Temp 97.8°F | Resp 16 | Wt 175.5 lb

## 2014-02-05 DIAGNOSIS — R358 Other polyuria: Secondary | ICD-10-CM

## 2014-02-05 DIAGNOSIS — N390 Urinary tract infection, site not specified: Secondary | ICD-10-CM

## 2014-02-05 DIAGNOSIS — R3589 Other polyuria: Secondary | ICD-10-CM

## 2014-02-05 LAB — POCT URINALYSIS DIPSTICK
Bilirubin, UA: NEGATIVE
Glucose, UA: NEGATIVE
Ketones, UA: NEGATIVE
Nitrite, UA: POSITIVE
Protein, UA: NEGATIVE
Spec Grav, UA: 1.015
Urobilinogen, UA: 0.2
pH, UA: 6

## 2014-02-05 MED ORDER — SULFAMETHOXAZOLE-TMP DS 800-160 MG PO TABS: 1.0000 | ORAL_TABLET | Freq: Two times a day (BID) | ORAL | Status: DC

## 2014-02-05 NOTE — Progress Notes (Signed)
Patient ID: Sandra Hendrix, female   DOB: 1946/02/27, 68 y.o.   MRN: [id number]  Patient Active Problem List   Diagnosis Date Noted  . UTI (urinary tract infection) 02/08/2014  . Welcome to Medicare preventive visit 10/21/2013  . Other and unspecified hyperlipidemia 07/15/2013  . Obesity, unspecified 07/15/2013  . Chronic venous insufficiency 03/21/2013  . DM (diabetes mellitus), type 2 with renal complications 41/96/2229  . Lack of libido 11/05/2012  . Barrett's esophagus 11/05/2012  . Hiatal hernia 11/05/2012  . Unspecified essential hypertension 11/05/2012  . Urinary incontinence     Subjective:  CC:   Chief Complaint  Patient presents with  . Urinary Tract Infection    dysuria, polyuria.    HPI:   Sandra Hendrix is a 68 y.o. female who presents for evaluation of One week of frequency,  Urgency discomfort with urination. occasional suprapubic pain   Past Medical History  Diagnosis Date  . Diabetes mellitus without complication   . Hyperlipidemia   . Hypertension   . Arthritis   . Depression   . GERD (gastroesophageal reflux disease)   . Allergy   . Urinary incontinence   . Varicose veins     Past Surgical History  Procedure Laterality Date  . Joint replacement Right 2007  . Joint replacement Left 2004       The following portions of the patient's history were reviewed and updated as appropriate: Allergies, current medications, and problem list.    Review of Systems:   Patient denies headache, fevers, malaise, unintentional weight loss, skin rash, eye pain, sinus congestion and sinus pain, sore throat, dysphagia,  hemoptysis , cough, dyspnea, wheezing, chest pain, palpitations, orthopnea, edema, abdominal pain, nausea, melena, diarrhea, constipation, flank pain, dysuria, hematuria, urinary  Frequency, nocturia, numbness, tingling, seizures,  Focal weakness, Loss of consciousness,  Tremor, insomnia, depression, anxiety, and suicidal ideation.      History   Social History  . Marital Status: Married    Spouse Name: N/A    Number of Children: N/A  . Years of Education: N/A   Occupational History  . Not on file.   Social History Main Topics  . Smoking status: Never Smoker   . Smokeless tobacco: Not on file  . Alcohol Use: No  . Drug Use: No  . Sexual Activity: Not on file   Other Topics Concern  . Not on file   Social History Narrative  . No narrative on file    Objective:  Filed Vitals:   02/05/14 1445  BP: 118/68  Pulse: 79  Temp: 97.8 F (36.6 C)  Resp: 16     General appearance: alert, cooperative and appears stated age Back: symmetric, no curvature. ROM normal. No CVA tenderness. Lungs: clear to auscultation bilaterally Heart: regular rate and rhythm, S1, S2 normal, no murmur, click, rub or gallop Abdomen: soft, non-tender; bowel sounds normal; no masses,  no organomegaly Pulses: 2+ and symmetric Skin: Skin color, texture, turgor normal. No rashes or lesions Lymph nodes: Cervical, supraclavicular, and axillary nodes normal.  Assessment and Plan:  UTI (urinary tract infection) History and UA consistent with UTI.  Empiric abx and probiotics advised.   Updated Medication List Outpatient Encounter Prescriptions as of 02/05/2014  Medication Sig  . albuterol (PROVENTIL HFA;VENTOLIN HFA) 108 (90 BASE) MCG/ACT inhaler Inhale 2 puffs into the lungs every 6 (six) hours as needed for wheezing.  Marland Kitchen aspirin 81 MG tablet Take 81 mg by mouth daily.  Marland Kitchen atorvastatin (LIPITOR) 20 MG tablet Take 1  tablet (20 mg total) by mouth daily.  Marland Kitchen azelastine (ASTEPRO) 137 MCG/SPRAY nasal spray Place 1 spray into the nose 2 (two) times daily. Use in each nostril as directed  . BIOTIN PO Take by mouth. Take 1000mg  by mouth twice daily.  . Black Cohosh (REMIFEMIN) 20 MG TABS Take 2 tablets by mouth daily.  . Calcium Carbonate-Vitamin D (CALCIUM 600 + D PO) Take 1 tablet by mouth daily.  . celecoxib (CELEBREX) 200 MG capsule  Take 200 mg by mouth daily.  . Cholecalciferol (D3-1000 PO) Take 2 tablets in the am and 1 tablet in the pm.  . diclofenac sodium (VOLTAREN) 1 % GEL Apply 2 g topically 4 (four) times daily.  . fish oil-omega-3 fatty acids 1000 MG capsule Take 1 capsule by mouth daily.  . flurandrenolide (CORDRAN) 0.05 % lotion Apply topically 2 (two) times daily. GENERIC PLEASE  . fluticasone (FLONASE) 50 MCG/ACT nasal spray Place 1 spray into both nostrils daily.  . furosemide (LASIX) 80 MG tablet Take 1 tablet (80 mg total) by mouth daily.  Marland Kitchen glipiZIDE (GLUCOTROL XL) 10 MG 24 hr tablet Take 1 tablet (10 mg total) by mouth daily.  Marland Kitchen lisinopril (PRINIVIL,ZESTRIL) 20 MG tablet TAKE 1 TABLET EVERY DAY  . Meclizine HCl 25 MG CHEW Chew by mouth.  . metformin (FORTAMET) 500 MG (OSM) 24 hr tablet Take 2 tablets (1,000 mg total) by mouth 2 (two) times daily with a meal.  . Multiple Vitamin (MULTIVITAMIN) tablet Take 1 tablet by mouth daily.  Marland Kitchen omeprazole (PRILOSEC) 20 MG capsule Take 1 capsule (20 mg total) by mouth daily.  . phenazopyridine (PYRIDIUM) 95 MG tablet Take 95 mg by mouth 3 (three) times daily as needed for pain.  Marland Kitchen Potassium 99 MG TABS Take 1 tablet by mouth daily.  . sertraline (ZOLOFT) 50 MG tablet TAKE 1 TABLET EVERY DAY  . verapamil (VERELAN PM) 180 MG 24 hr capsule Take 1 capsule (180 mg total) by mouth daily.  . vitamin C (ASCORBIC ACID) 500 MG tablet Take 500 mg by mouth daily.  Marland Kitchen sulfamethoxazole-trimethoprim (BACTRIM DS) 800-160 MG per tablet Take 1 tablet by mouth 2 (two) times daily.     Orders Placed This Encounter  Procedures  . CULTURE, URINE COMPREHENSIVE  . POCT Urinalysis Dipstick    No Follow-up on file.

## 2014-02-05 NOTE — Progress Notes (Signed)
Pre-visit discussion using our clinic review tool. No additional management support is needed unless otherwise documented below in the visit note.  

## 2014-02-05 NOTE — Telephone Encounter (Signed)
Please advise 

## 2014-02-05 NOTE — Telephone Encounter (Signed)
Patient scheduled at 2:30

## 2014-02-05 NOTE — Patient Instructions (Signed)
You appear to have a UTI  I have prescribed Septra DS to take 2 times daily  for 3 days  Please take a probiotic ( Align, Floraque or Culturelle) for 2 weeks if you start the antibiotic to prevent a serious antibiotic associated diarrhea  Called" clostridium dificile colitis" ( should also help prevent   vaginal yeast infection)   

## 2014-02-05 NOTE — Telephone Encounter (Signed)
Patient Information:  Caller Name: Brizeida  Phone: [phone]  Patient: Sandra Hendrix  Gender: Female  DOB: 1946-02-29  Age: 68 Years  PCP: Deborra Medina (Adults only)  Office Follow Up:  Does the office need to follow up with this patient?: Yes  Instructions For The Office: see notes  RN Note:  Triage disp of see today, however no appts available today. Pt states she has never been to any of the other [place] locations before. Assured her will send message to see if she can be worked in or if MD thinks ok to make appt for tomorrow or if MD has any other recommendations and that someone will call her back shortly with MD recommendations/instructions. Agreed to plan.  Symptoms  Reason For Call & Symptoms: Urinary sxs for over a week: frequency, urgency, burning with urination intermittently. States she has had similar sxs in past but when she increased her water intake and drank cranberry juice sxs resolved quickly but not working this time. States she has never been diagnosed with a UTI in past. States she did purchase an OTC urinary pain med and took it last night and it has provided some relief, so she feels a little better now compared to couple days ago.  Denies any fever or any flank/side pain or any blood in urine.  Reviewed Health History In EMR: Yes  Reviewed Medications In EMR: Yes  Reviewed Allergies In EMR: Yes  Reviewed Surgeries / Procedures: Yes  Date of Onset of Symptoms: 01/28/2014  Treatments Tried: Cranberry juice; increased fluid intake  Treatments Tried Worked: No  Guideline(s) Used:  Urination Pain - Female  Disposition Per Guideline:   See Today in Office  Reason For Disposition Reached:   Age > 50 years  Advice Given:  Fluids:   Drink extra fluids. Drink 8-10 glasses of liquids a day (Reason: to produce a dilute, non-irritating urine).  Cranberry Juice:   Some people think that drinking cranberry juice may help in fighting urinary tract infections.    Patient Will Follow Care Advice:  YES

## 2014-02-08 DIAGNOSIS — N390 Urinary tract infection, site not specified: Secondary | ICD-10-CM | POA: Insufficient documentation

## 2014-02-08 LAB — CULTURE, URINE COMPREHENSIVE: Colony Count: >=100000

## 2014-02-08 NOTE — Assessment & Plan Note (Signed)
History and UA consistent with UTI.  Empiric abx and probiotics advised.

## 2014-03-03 ENCOUNTER — Ambulatory Visit: Payer: Self-pay | Admitting: Gastroenterology

## 2014-03-05 LAB — PATHOLOGY REPORT

## 2014-03-07 LAB — HM MAMMOGRAPHY: HM Mammogram: NORMAL

## 2014-03-14 ENCOUNTER — Other Ambulatory Visit: Payer: Self-pay | Admitting: Internal Medicine

## 2014-03-21 ENCOUNTER — Encounter: Payer: Self-pay | Admitting: Internal Medicine

## 2014-03-30 ENCOUNTER — Telehealth: Payer: Self-pay | Admitting: Internal Medicine

## 2014-03-30 DIAGNOSIS — K2270 Barrett's esophagus without dysplasia: Secondary | ICD-10-CM

## 2014-05-07 ENCOUNTER — Ambulatory Visit (INDEPENDENT_AMBULATORY_CARE_PROVIDER_SITE_OTHER): Payer: Medicare PPO | Admitting: Internal Medicine

## 2014-05-07 ENCOUNTER — Encounter: Payer: Self-pay | Admitting: Internal Medicine

## 2014-05-07 VITALS — BP 124/78 | HR 70 | Temp 97.8°F | Resp 18 | Ht 61.0 in | Wt 174.0 lb

## 2014-05-07 DIAGNOSIS — E119 Type 2 diabetes mellitus without complications: Secondary | ICD-10-CM

## 2014-05-07 DIAGNOSIS — E1121 Type 2 diabetes mellitus with diabetic nephropathy: Secondary | ICD-10-CM

## 2014-05-07 DIAGNOSIS — E1129 Type 2 diabetes mellitus with other diabetic kidney complication: Secondary | ICD-10-CM

## 2014-05-07 DIAGNOSIS — E669 Obesity, unspecified: Secondary | ICD-10-CM

## 2014-05-07 DIAGNOSIS — I10 Essential (primary) hypertension: Secondary | ICD-10-CM

## 2014-05-07 DIAGNOSIS — N058 Unspecified nephritic syndrome with other morphologic changes: Secondary | ICD-10-CM

## 2014-05-07 LAB — COMPREHENSIVE METABOLIC PANEL
ALT: 19 U/L (ref 0–35)
AST: 24 U/L (ref 0–37)
Albumin: 4.1 g/dL (ref 3.5–5.2)
Alkaline Phosphatase: 94 U/L (ref 39–117)
BUN: 38 mg/dL — ABNORMAL HIGH (ref 6–23)
CO2: 26 mEq/L (ref 19–32)
Calcium: 9.7 mg/dL (ref 8.4–10.5)
Chloride: 101 mEq/L (ref 96–112)
Creatinine, Ser: 1.2 mg/dL (ref 0.4–1.2)
GFR: 47.65 mL/min — ABNORMAL LOW (ref >60.00)
Glucose, Bld: 155 mg/dL — ABNORMAL HIGH (ref 70–99)
Potassium: 4.4 mEq/L (ref 3.5–5.1)
Sodium: 136 mEq/L (ref 135–145)
Total Bilirubin: 0.7 mg/dL (ref 0.2–1.2)
Total Protein: 7 g/dL (ref 6.0–8.3)

## 2014-05-07 LAB — HM DIABETES FOOT EXAM: HM Diabetic Foot Exam: NORMAL

## 2014-05-07 LAB — HEMOGLOBIN A1C: Hgb A1c MFr Bld: 8 % — ABNORMAL HIGH (ref 4.6–6.5)

## 2014-05-07 NOTE — Progress Notes (Signed)
Pre-visit discussion using our clinic review tool. No additional management support is needed unless otherwise documented below in the visit note.  

## 2014-05-07 NOTE — Patient Instructions (Addendum)
You are doing well.  If your A1c is < 7.0 today, no medication changes will be needed today  We will refill your medications for 90 days  See you in 3 months,  plesae do fasting labs prior to visit (after November 12)

## 2014-05-08 ENCOUNTER — Encounter: Payer: Self-pay | Admitting: Internal Medicine

## 2014-05-08 NOTE — Assessment & Plan Note (Signed)
I have addressed  BMI and recommended wt loss of 10% of body weight over the next 6 months using a low glycemic index diet and regular exercise a minimum of 5 days per week.   

## 2014-05-08 NOTE — Progress Notes (Signed)
Patient ID: Sandra Hendrix, female   DOB: Feb 29, 1946, 68 y.o.   MRN: [id number]  Patient Active Problem List   Diagnosis Date Noted  . UTI (urinary tract infection) 02/08/2014  . Acid reflux 12/26/2013  . Irritable bowel syndrome with constipation 12/26/2013  . Welcome to Medicare preventive visit 10/21/2013  . Other and unspecified hyperlipidemia 07/15/2013  . Obesity, unspecified 07/15/2013  . Chronic venous insufficiency 03/21/2013  . DM (diabetes mellitus), type 2, uncontrolled, with renal complications 33/29/5188  . Lack of libido 11/05/2012  . Barrett's esophagus 11/05/2012  . Hiatal hernia 11/05/2012  . Unspecified essential hypertension 11/05/2012  . Urinary incontinence     Subjective:  CC:   Chief Complaint  Patient presents with  . Follow-up  . Diabetes    HPI:   Sandra Hendrix is a 68 y.o. female who presents for follow up on Type 2 DM, obesity and hypertension.  She has been taking her medications as directed but has not been following a low glycemic index diet regularly and has gained a few lbs .  Fasting sugars have been elevated with occasional sugars close to 200.  She is not exercsing regularly.  Denies hypoglyemic events.    Past Medical History  Diagnosis Date  . Diabetes mellitus without complication   . Hyperlipidemia   . Hypertension   . Arthritis   . Depression   . GERD (gastroesophageal reflux disease)   . Allergy   . Urinary incontinence   . Varicose veins     Past Surgical History  Procedure Laterality Date  . Joint replacement Right 2007  . Joint replacement Left 2004       The following portions of the patient's history were reviewed and updated as appropriate: Allergies, current medications, and problem list.    Review of Systems:   Patient denies headache, fevers, malaise, unintentional weight loss, skin rash, eye pain, sinus congestion and sinus pain, sore throat, dysphagia,  hemoptysis , cough, dyspnea, wheezing, chest  pain, palpitations, orthopnea, edema, abdominal pain, nausea, melena, diarrhea, constipation, flank pain, dysuria, hematuria, urinary  Frequency, nocturia, numbness, tingling, seizures,  Focal weakness, Loss of consciousness,  Tremor, insomnia, depression, anxiety, and suicidal ideation.     History   Social History  . Marital Status: Married    Spouse Name: N/A    Number of Children: N/A  . Years of Education: N/A   Occupational History  . Not on file.   Social History Main Topics  . Smoking status: Never Smoker   . Smokeless tobacco: Not on file  . Alcohol Use: No  . Drug Use: No  . Sexual Activity: Not on file   Other Topics Concern  . Not on file   Social History Narrative  . No narrative on file    Objective:  Filed Vitals:   05/07/14 0930  BP: 124/78  Pulse: 70  Temp: 97.8 F (36.6 C)  Resp: 18     General appearance: alert, cooperative and appears stated age Ears: normal TM's and external ear canals both ears Throat: lips, mucosa, and tongue normal; teeth and gums normal Neck: no adenopathy, no carotid bruit, supple, symmetrical, trachea midline and thyroid not enlarged, symmetric, no tenderness/mass/nodules Back: symmetric, no curvature. ROM normal. No CVA tenderness. Lungs: clear to auscultation bilaterally Heart: regular rate and rhythm, S1, S2 normal, no murmur, click, rub or gallop Abdomen: soft, non-tender; bowel sounds normal; no masses,  no organomegaly Pulses: 2+ and symmetric Skin: Skin color, texture, turgor normal.  No rashes or lesions Lymph nodes: Cervical, supraclavicular, and axillary nodes normal.  Assessment and Plan:  DM (diabetes mellitus), type 2, uncontrolled, with renal complications   Diminishing control with increase in A1c from 7 to 8 . Patient asked to check sugars twice daily for 2 weeks and submit log for medication adjustment    Lab Results  Component Value Date   HGBA1C 8.0* 05/07/2014   Lab Results  Component Value  Date   MICROALBUR 1.0 01/20/2014   Lab Results  Component Value Date   CREATININE 1.2 05/07/2014     She is up-to-date on eye exams and her foot exam is normal.      Unspecified essential hypertension Well controlled on current regimen. Renal function stable, no changes today.  Lab Results  Component Value Date   CREATININE 1.2 05/07/2014   Lab Results  Component Value Date   NA 136 05/07/2014   K 4.4 05/07/2014   CL 101 05/07/2014   CO2 26 05/07/2014      Obesity, unspecified I have addressed  BMI and recommended wt loss of 10% of body weight over the next 6 months using a low glycemic index diet and regular exercise a minimum of 5 days per week.     Updated Medication List Outpatient Encounter Prescriptions as of 05/07/2014  Medication Sig  . albuterol (PROVENTIL HFA;VENTOLIN HFA) 108 (90 BASE) MCG/ACT inhaler Inhale 2 puffs into the lungs every 6 (six) hours as needed for wheezing.  Marland Kitchen aspirin 81 MG tablet Take 81 mg by mouth daily.  Marland Kitchen atorvastatin (LIPITOR) 20 MG tablet Take 1 tablet (20 mg total) by mouth daily.  Marland Kitchen azelastine (ASTEPRO) 137 MCG/SPRAY nasal spray Place 1 spray into the nose 2 (two) times daily. Use in each nostril as directed  . BIOTIN PO Take by mouth. Take 1000mg  by mouth twice daily.  . Black Cohosh (REMIFEMIN) 20 MG TABS Take 2 tablets by mouth daily.  . Calcium Carbonate-Vitamin D (CALCIUM 600 + D PO) Take 1 tablet by mouth daily.  . Cholecalciferol (D3-1000 PO) Take 2 tablets in the am and 1 tablet in the pm.  . fish oil-omega-3 fatty acids 1000 MG capsule Take 1 capsule by mouth daily.  . fluticasone (FLONASE) 50 MCG/ACT nasal spray Place 1 spray into both nostrils daily.  . furosemide (LASIX) 80 MG tablet TAKE 1 TABLET TWICE DAILY  . glipiZIDE (GLUCOTROL XL) 10 MG 24 hr tablet Take 1 tablet (10 mg total) by mouth daily.  Marland Kitchen lisinopril (PRINIVIL,ZESTRIL) 20 MG tablet TAKE 1 TABLET EVERY DAY  . Meclizine HCl 25 MG CHEW Chew by mouth.  . metformin  (FORTAMET) 500 MG (OSM) 24 hr tablet Take 2 tablets (1,000 mg total) by mouth 2 (two) times daily with a meal.  . Multiple Vitamin (MULTIVITAMIN) tablet Take 1 tablet by mouth daily.  Marland Kitchen omeprazole (PRILOSEC) 20 MG capsule Take 1 capsule (20 mg total) by mouth daily.  . Potassium 99 MG TABS Take 1 tablet by mouth daily.  . sertraline (ZOLOFT) 50 MG tablet TAKE 1 TABLET EVERY DAY  . verapamil (VERELAN PM) 180 MG 24 hr capsule Take 1 capsule (180 mg total) by mouth daily.  . vitamin C (ASCORBIC ACID) 500 MG tablet Take 500 mg by mouth daily.  . Ascorbic Acid 500 MG CHEW Chew by mouth.  . Omega-3 Fatty Acids (FISH OIL) 1000 MG CAPS Take by mouth.  . Vitamin D, Ergocalciferol, (DRISDOL) 50000 UNITS CAPS capsule Take by mouth.  . [DISCONTINUED]  celecoxib (CELEBREX) 200 MG capsule Take 200 mg by mouth daily.  . [DISCONTINUED] diclofenac sodium (VOLTAREN) 1 % GEL Apply 2 g topically 4 (four) times daily.  . [DISCONTINUED] flurandrenolide (CORDRAN) 0.05 % lotion Apply topically 2 (two) times daily. GENERIC PLEASE  . [DISCONTINUED] phenazopyridine (PYRIDIUM) 95 MG tablet Take 95 mg by mouth 3 (three) times daily as needed for pain.  . [DISCONTINUED] sulfamethoxazole-trimethoprim (BACTRIM DS) 800-160 MG per tablet Take 1 tablet by mouth 2 (two) times daily.     Orders Placed This Encounter  Procedures  . HM MAMMOGRAPHY  . Hemoglobin A1c  . Comprehensive metabolic panel  . HM DIABETES FOOT EXAM    Return in about 3 months (around 08/07/2014) for follow up diabetes.

## 2014-05-08 NOTE — Assessment & Plan Note (Addendum)
Diminishing control with increase in A1c from 7 to 8 . Patient asked to check sugars twice daily for 2 weeks and submit log for medication adjustment    Lab Results  Component Value Date   HGBA1C 8.0* 05/07/2014   Lab Results  Component Value Date   MICROALBUR 1.0 01/20/2014   Lab Results  Component Value Date   CREATININE 1.2 05/07/2014     She is up-to-date on eye exams and her foot exam is normal.

## 2014-05-08 NOTE — Assessment & Plan Note (Signed)
Well controlled on current regimen. Renal function stable, no changes today.  Lab Results  Component Value Date   CREATININE 1.2 05/07/2014   Lab Results  Component Value Date   NA 136 05/07/2014   K 4.4 05/07/2014   CL 101 05/07/2014   CO2 26 05/07/2014

## 2014-05-14 ENCOUNTER — Telehealth: Payer: Self-pay | Admitting: Internal Medicine

## 2014-05-23 ENCOUNTER — Encounter: Payer: Self-pay | Admitting: Internal Medicine

## 2014-06-10 ENCOUNTER — Other Ambulatory Visit: Payer: Self-pay | Admitting: Internal Medicine

## 2014-06-20 ENCOUNTER — Telehealth: Payer: Self-pay | Admitting: Internal Medicine

## 2014-06-20 DIAGNOSIS — E1165 Type 2 diabetes mellitus with hyperglycemia: Secondary | ICD-10-CM

## 2014-06-20 DIAGNOSIS — IMO0002 Reserved for concepts with insufficient information to code with codable children: Secondary | ICD-10-CM

## 2014-06-20 DIAGNOSIS — E1129 Type 2 diabetes mellitus with other diabetic kidney complication: Secondary | ICD-10-CM

## 2014-06-20 NOTE — Telephone Encounter (Signed)
Patient stated she is not exercising at other than taking care of some other people.

## 2014-06-20 NOTE — Assessment & Plan Note (Signed)
Blood sugars from the past  two weeks indicate wide range of blood sugars from normal to 300.  This will be difficutl to address with medications without causing low blood sugars.  Is she exercising daily?

## 2014-06-20 NOTE — Telephone Encounter (Signed)
She needs to follow a low glycemic index diet and start 30 minutes of daily walking after her biggest meal.  Repeat bloodsugars in 2 weeks

## 2014-06-20 NOTE — Telephone Encounter (Signed)
Blood sugars from the past  two weeks indicate wide range of blood sugars from normal to 300.  This will be difficutl to address with medications without causing low blood sugars.  Is she exercising daily?

## 2014-06-24 ENCOUNTER — Other Ambulatory Visit: Payer: Self-pay | Admitting: *Deleted

## 2014-06-24 DIAGNOSIS — E1165 Type 2 diabetes mellitus with hyperglycemia: Secondary | ICD-10-CM

## 2014-06-24 DIAGNOSIS — IMO0002 Reserved for concepts with insufficient information to code with codable children: Secondary | ICD-10-CM

## 2014-06-24 DIAGNOSIS — E1129 Type 2 diabetes mellitus with other diabetic kidney complication: Secondary | ICD-10-CM

## 2014-06-24 MED ORDER — ACCU-CHEK SOFTCLIX LANCET DEV MISC
Status: DC
Start: 2014-06-24 — End: 2017-04-17

## 2014-06-24 NOTE — Progress Notes (Signed)
Sent script to [facility] for lancets requested by fax.

## 2014-06-24 NOTE — Telephone Encounter (Signed)
Patient notified and voiced understanding.

## 2014-07-07 LAB — HM DIABETES EYE EXAM

## 2014-07-28 ENCOUNTER — Telehealth: Payer: Self-pay | Admitting: *Deleted

## 2014-07-28 DIAGNOSIS — S62109A Fracture of unspecified carpal bone, unspecified wrist, initial encounter for closed fracture: Secondary | ICD-10-CM

## 2014-07-28 NOTE — Telephone Encounter (Signed)
Pt had a fall while in [place] on 07/25/14. Was seen in [facility] and told she had a fractured right wrist, needs orthopedic referral. Does not have a preference, someone in [place].

## 2014-07-28 NOTE — Telephone Encounter (Signed)
Referral is in process as requested to [facility].  Is she in a splint now and does she need anything for pain?

## 2014-07-28 NOTE — Telephone Encounter (Signed)
Left message, notifying patient. Requested call back if needing pain medication.

## 2014-08-07 ENCOUNTER — Ambulatory Visit (INDEPENDENT_AMBULATORY_CARE_PROVIDER_SITE_OTHER): Payer: Medicare PPO | Admitting: Internal Medicine

## 2014-08-07 ENCOUNTER — Encounter: Payer: Self-pay | Admitting: Internal Medicine

## 2014-08-07 VITALS — BP 108/62 | HR 62 | Temp 97.5°F | Resp 16 | Ht 61.0 in | Wt 175.5 lb

## 2014-08-07 DIAGNOSIS — E669 Obesity, unspecified: Secondary | ICD-10-CM

## 2014-08-07 DIAGNOSIS — E1165 Type 2 diabetes mellitus with hyperglycemia: Secondary | ICD-10-CM

## 2014-08-07 DIAGNOSIS — Z13820 Encounter for screening for osteoporosis: Secondary | ICD-10-CM

## 2014-08-07 DIAGNOSIS — E1129 Type 2 diabetes mellitus with other diabetic kidney complication: Secondary | ICD-10-CM

## 2014-08-07 DIAGNOSIS — E785 Hyperlipidemia, unspecified: Secondary | ICD-10-CM

## 2014-08-07 DIAGNOSIS — IMO0001 Reserved for inherently not codable concepts without codable children: Secondary | ICD-10-CM

## 2014-08-07 DIAGNOSIS — IMO0002 Reserved for concepts with insufficient information to code with codable children: Secondary | ICD-10-CM

## 2014-08-07 LAB — COMPREHENSIVE METABOLIC PANEL
ALT: 20 U/L (ref 0–35)
AST: 24 U/L (ref 0–37)
Albumin: 3.7 g/dL (ref 3.5–5.2)
Alkaline Phosphatase: 108 U/L (ref 39–117)
BUN: 42 mg/dL — ABNORMAL HIGH (ref 6–23)
CO2: 23 mEq/L (ref 19–32)
Calcium: 9.6 mg/dL (ref 8.4–10.5)
Chloride: 101 mEq/L (ref 96–112)
Creatinine, Ser: 1.3 mg/dL — ABNORMAL HIGH (ref 0.4–1.2)
GFR: 42.25 mL/min — ABNORMAL LOW (ref >60.00)
Glucose, Bld: 163 mg/dL — ABNORMAL HIGH (ref 70–99)
Potassium: 4.5 mEq/L (ref 3.5–5.1)
Sodium: 135 mEq/L (ref 135–145)
Total Bilirubin: 0.6 mg/dL (ref 0.2–1.2)
Total Protein: 7.3 g/dL (ref 6.0–8.3)

## 2014-08-07 LAB — LIPID PANEL
Cholesterol: 217 mg/dL — ABNORMAL HIGH (ref 0–200)
HDL: 63.3 mg/dL (ref >39.00)
LDL Cholesterol: 125 mg/dL — ABNORMAL HIGH (ref 0–99)
NonHDL: 153.7
Total CHOL/HDL Ratio: 3
Triglycerides: 144 mg/dL (ref 0.0–149.0)
VLDL: 28.8 mg/dL (ref 0.0–40.0)

## 2014-08-07 LAB — HEMOGLOBIN A1C: Hgb A1c MFr Bld: 7.7 % — ABNORMAL HIGH (ref 4.6–6.5)

## 2014-08-07 LAB — MICROALBUMIN / CREATININE URINE RATIO
Creatinine,U: 90.4 mg/dL
Microalb Creat Ratio: 0.8 mg/g (ref 0.0–30.0)
Microalb, Ur: 0.7 mg/dL (ref 0.0–1.9)

## 2014-08-07 NOTE — Progress Notes (Signed)
Pre-visit discussion using our clinic review tool. No additional management support is needed unless otherwise documented below in the visit note.  

## 2014-08-07 NOTE — Progress Notes (Signed)
Patient ID: Sandra Hendrix, female   DOB: October 16, 1945, 68 y.o.   MRN: [id number]   Patient Active Problem List   Diagnosis Date Noted  . UTI (urinary tract infection) 02/08/2014  . Acid reflux 12/26/2013  . Irritable bowel syndrome with constipation 12/26/2013  . Welcome to Medicare preventive visit 10/21/2013  . Hyperlipidemia LDL goal <100 07/15/2013  . Obesity 07/15/2013  . Chronic venous insufficiency 03/21/2013  . DM (diabetes mellitus), type 2, uncontrolled, with renal complications 33/38/3291  . Lack of libido 11/05/2012  . Barrett's esophagus 11/05/2012  . Hiatal hernia 11/05/2012  . Unspecified essential hypertension 11/05/2012  . Urinary incontinence     Subjective:  CC:   Chief Complaint  Patient presents with  . Follow-up  . Diabetes    HPI:   Sandra Hendrix is a 68 y.o. female who presents for  3 month follow up on Type 2 DM, recent diagnosis,  Hypertension, hyperlipidemia and obesity.  She has had an intentional weight loss of 20 lbs since last visit by reducing the sugar in her diet.  She has attended the [facility] pharmacy Diabetes Education program.  She has been keeping her carbohydrate content well under the recommended  45 carbs per meal by elininiating sweetened tea and soda and cutting out bagels for breakfast.  She is eating oatmeal prepared from  steel cut oats for breakfast instead and  checks her sugars once daily in the morning, usually.   cbgs have been consistently under 130.  She saw Dr Tamala Julian 1 week  ago for radial fracture sustained during a fall 2 weeksa ago,  Denies any pain currently  Has not had a DEXA scan in several years.    Past Medical History  Diagnosis Date  . Diabetes mellitus without complication   . Hyperlipidemia   . Hypertension   . Arthritis   . Depression   . GERD (gastroesophageal reflux disease)   . Allergy   . Urinary incontinence   . Varicose veins     Past Surgical History  Procedure Laterality Date  . Joint  replacement Right 2007  . Joint replacement Left 2004       The following portions of the patient's history were reviewed and updated as appropriate: Allergies, current medications, and problem list.    Review of Systems:   Patient denies headache, fevers, malaise, unintentional weight loss, skin rash, eye pain, sinus congestion and sinus pain, sore throat, dysphagia,  hemoptysis , cough, dyspnea, wheezing, chest pain, palpitations, orthopnea, edema, abdominal pain, nausea, melena, diarrhea, constipation, flank pain, dysuria, hematuria, urinary  Frequency, nocturia, numbness, tingling, seizures,  Focal weakness, Loss of consciousness,  Tremor, insomnia, depression, anxiety, and suicidal ideation.     History   Social History  . Marital Status: Married    Spouse Name: N/A    Number of Children: N/A  . Years of Education: N/A   Occupational History  . Not on file.   Social History Main Topics  . Smoking status: Never Smoker   . Smokeless tobacco: Not on file  . Alcohol Use: No  . Drug Use: No  . Sexual Activity: Not on file   Other Topics Concern  . Not on file   Social History Narrative    Objective:  Filed Vitals:   08/07/14 0901  BP: 108/62  Pulse: 62  Temp: 97.5 F (36.4 C)  Resp: 16     General appearance: alert, cooperative and appears stated age Ears: normal TM's and external ear  canals both ears Throat: lips, mucosa, and tongue normal; teeth and gums normal Neck: no adenopathy, no carotid bruit, supple, symmetrical, trachea midline and thyroid not enlarged, symmetric, no tenderness/mass/nodules Back: symmetric, no curvature. ROM normal. No CVA tenderness. Lungs: clear to auscultation bilaterally Heart: regular rate and rhythm, S1, S2 normal, no murmur, click, rub or gallop Abdomen: soft, non-tender; bowel sounds normal; no masses,  no organomegaly Pulses: 2+ and symmetric Skin: Skin color, texture, turgor normal. No rashes or lesions Lymph nodes:  Cervical, supraclavicular, and axillary nodes normal.  Assessment and Plan:  DM (diabetes mellitus), type 2, uncontrolled, with renal complications Improving control with low GI diet and exercise .  hemoglobin A1c is lower.. Patient is referred for  eye exam and foot exam was done today.  There is  no proteinuria on prior micro urinalysis .  Fasting lipids will be repeated  and statin therapy advised if indicated by application of new ACC guidelines based on patient's 10 year risk of CAD  .     Lab Results  Component Value Date   HGBA1C 7.7* 08/07/2014   Lab Results  Component Value Date   MICROALBUR 0.7 08/07/2014   Lab Results  Component Value Date   CHOL 217* 08/07/2014   HDL 63.30 08/07/2014   LDLCALC 125* 08/07/2014   LDLDIRECT 98.7 07/15/2013   TRIG 144.0 08/07/2014   CHOLHDL 3 08/07/2014       Hyperlipidemia LDL goal <100 Well controlled on current statin therapy.   Liver enzymes are normal , no changes today.  Lab Results  Component Value Date   CHOL 217* 08/07/2014   HDL 63.30 08/07/2014   LDLCALC 125* 08/07/2014   LDLDIRECT 98.7 07/15/2013   TRIG 144.0 08/07/2014   CHOLHDL 3 08/07/2014   Lab Results  Component Value Date   ALT 20 08/07/2014   AST 24 08/07/2014   ALKPHOS 108 08/07/2014   BILITOT 0.6 08/07/2014      Obesity I have addressed  BMI and recommended wt loss of 10% of body weigh over the next 6 months using a low glycemic index diet and regular exercise a minimum of 5 days per week.     Updated Medication List Outpatient Encounter Prescriptions as of 08/07/2014  Medication Sig  . albuterol (PROVENTIL HFA;VENTOLIN HFA) 108 (90 BASE) MCG/ACT inhaler Inhale 2 puffs into the lungs every 6 (six) hours as needed for wheezing.  . Ascorbic Acid 500 MG CHEW Chew by mouth.  Marland Kitchen aspirin 81 MG tablet Take 81 mg by mouth daily.  Marland Kitchen atorvastatin (LIPITOR) 20 MG tablet Take 1 tablet (20 mg total) by mouth daily.  Marland Kitchen azelastine (ASTEPRO) 137 MCG/SPRAY  nasal spray Place 1 spray into the nose 2 (two) times daily. Use in each nostril as directed  . BIOTIN PO Take by mouth. Take 1000mg  by mouth twice daily.  . Black Cohosh (REMIFEMIN) 20 MG TABS Take 2 tablets by mouth daily.  . Calcium Carbonate-Vitamin D (CALCIUM 600 + D PO) Take 1 tablet by mouth daily.  . Cholecalciferol (D3-1000 PO) Take 2 tablets in the am and 1 tablet in the pm.  . fish oil-omega-3 fatty acids 1000 MG capsule Take 1 capsule by mouth daily.  . fluticasone (FLONASE) 50 MCG/ACT nasal spray Place 1 spray into both nostrils daily.  . furosemide (LASIX) 80 MG tablet TAKE 1 TABLET TWICE DAILY  . glipiZIDE (GLUCOTROL XL) 10 MG 24 hr tablet Take 1 tablet (10 mg total) by mouth daily.  Elmore Guise Devices (  ACCU-CHEK SOFTCLIX) lancets Use to test blood sugars 1 -2 times daily  . lisinopril (PRINIVIL,ZESTRIL) 20 MG tablet TAKE 1 TABLET EVERY DAY  . Meclizine HCl 25 MG CHEW Chew by mouth.  . metformin (FORTAMET) 500 MG (OSM) 24 hr tablet Take 2 tablets (1,000 mg total) by mouth 2 (two) times daily with a meal.  . Multiple Vitamin (MULTIVITAMIN) tablet Take 1 tablet by mouth daily.  . Omega-3 Fatty Acids (FISH OIL) 1000 MG CAPS Take by mouth.  Marland Kitchen omeprazole (PRILOSEC) 20 MG capsule Take 1 capsule (20 mg total) by mouth daily.  . sertraline (ZOLOFT) 50 MG tablet TAKE 1 TABLET EVERY DAY  . verapamil (VERELAN PM) 180 MG 24 hr capsule Take 1 capsule (180 mg total) by mouth daily.  . vitamin C (ASCORBIC ACID) 500 MG tablet Take 500 mg by mouth daily.  . Potassium 99 MG TABS Take 1 tablet by mouth daily.  . Vitamin D, Ergocalciferol, (DRISDOL) 50000 UNITS CAPS capsule Take by mouth.     Orders Placed This Encounter  Procedures  . DG Bone Density  . Comprehensive metabolic panel  . Hemoglobin A1c  . Microalbumin / creatinine urine ratio  . Lipid panel    Return in about 3 months (around 11/07/2014) for follow up diabetes.

## 2014-08-07 NOTE — Patient Instructions (Addendum)
I want you to lose 18 lbs over the next six months with a low glycemic index diet and regular exercise (30 minutes of cardio 5 days per week is your goal)  This is  my version of a  "Low GI"  Diet:  It will still lower your blood sugars and allow you to lose 4 to 8  lbs  per month if you follow it carefully.  Your goal with exercise is a minimum of 30 minutes of aerobic exercise 5 days per week (Walking does not count once it becomes easy!)     All of the foods can be found at grocery stores and in bulk at [employer].  The Atkins protein bars and shakes are available in more varieties at [employer], [employer] and [place].     7 AM Breakfast:  Choose from the following:  Low carbohydrate Protein  Shakes (I recommend the EAS AdvantEdge "Carb Control" shakes  Or the low carb shakes by Atkins.    2.5 carbs   Arnold's "Sandwhich Thin"toasted  w/ peanut butter (no jelly: about 20 net carbs  "Bagel Thin" with cream cheese and salmon: about 20 carbs   a scrambled egg/bacon/cheese burrito made with Mission's "carb balance" whole wheat tortilla  (about 10 net carbs )  A slice of home made fritatta (egg based dish without a crust:  google it)    Avoid cereal and bananas, oatmeal and cream of wheat and grits. They are loaded with carbohydrates!   10 AM: high protein snack  Protein bar by Atkins (the snack size, under 200 cal, usually < 6 net carbs).    A stick of cheese:  Around 1 carb,  100 cal     Dannon Light n Fit [place] Yogurt  (80 cal, 8 carbs)  Other so called "protein bars" and Greek yogurts tend to be loaded with carbohydrates.  Remember, in food advertising, the word "energy" is synonymous for " carbohydrate."  Lunch:   A Sandwich using the bread choices listed, Can use any  Eggs,  lunchmeat, grilled meat or canned tuna), avocado, regular mayo/mustard  and cheese.  A Salad using blue cheese, ranch,  Goddess or vinagrette,  No croutons or "confetti" and no "candied nuts" but regular nuts OK.    No pretzels or chips.  Pickles and miniature sweet peppers are a good low carb alternative that provide a "crunch"  The bread is the only source of carbohydrate in a sandwich and  can be decreased by trying some of these alternatives to traditional loaf bread  Joseph's makes a pita bread and a flat bread that are 50 cal and 4 net carbs available at [place] and [employer].  This can be toasted to use with hummous as well  Toufayan makes a low carb flatbread that's 100 cal and 9 net carbs available at [employer] and [employer]'s makes 2 sizes of  Low carb whole wheat tortilla  (The large one is 210 cal and 6 net carbs)  Flat Out makes flatbreads that are low carb as well  Avoid "Low fat dressings, as well as Barry Brunner and Viola dressings They are loaded with sugar!   3 PM/ Mid day  Snack:  Consider  1 ounce of  almonds, walnuts, pistachios, pecans, peanuts,  Macadamia nuts or a nut medley.  Avoid "granola"; the dried cranberries and raisins are loaded with carbohydrates. Mixed nuts as long as there are no raisins,  cranberries or dried fruit.  Try the prosciutto/mozzarella cheese sticks by Fiorruci  In deli /backery section   High protein   To avoid overindulging in snacks: Try drinking a glass of unsweeted almond/coconut milk  Or a cup of coffee with your Atkins chocolate bar to keep you from having 3!!!   Pork rinds!  Yes Pork Rinds        6 PM  Dinner:     Meat/fowl/fish with a green salad, and either broccoli, cauliflower, green beans, spinach, brussel sprouts or  Lima beans. DO NOT BREAD THE PROTEIN!!      There is a low carb pasta by Dreamfield's that is acceptable and tastes great: only 5 digestible carbs/serving.( All grocery stores but BJs carry it )  Try Hurley Cisco Angelo's chicken piccata or chicken or eggplant parm over low carb pasta.(Lowes and BJs)   Marjory Lies Sanchez's "Carnitas" (pulled pork, no sauce,  0 carbs) or his beef pot roast to make a dinner burrito (at BJ's)  Pesto over  low carb pasta (bj's sells a good quality pesto in the center refrigerated section of the deli   Try satueeing  Cheral Marker with mushroooms  Whole wheat pasta is still full of digestible carbs and  Not as low in glycemic index as Dreamfield's.   Brown rice is still rice,  So skip the rice and noodles if you eat [place] or [place] (or at least limit to 1/2 cup)  9 PM snack :   Breyer's "low carb" fudgsicle or  ice cream bar (Carb Smart line), or  Weight Watcher's ice cream bar , or another "no sugar added" ice cream;  a serving of fresh berries/cherries with whipped cream   Cheese or DANNON'S LlGHT N FIT GREEK YOGURT or the Oikos greek yogurt   8 ounces of [place] unsweetened almond/cococunut milk  Cheese and crackers (using WASA crackers,  They are low carb) or peanut butter on low carb crackers or pita bread     Avoid bananas, pineapple, grapes  and watermelon on a regular basis because they are high in sugar.  THINK OF THEM AS DESSERT  Remember that snack Substitutions should be less than 10 NET carbs per serving and meals should be < 25 net carbs. Remember that carbohydrates from fiber do not affect blood sugar, so you can  subtract fiber grams to get the "net carbs " of any particular food item.

## 2014-08-08 ENCOUNTER — Other Ambulatory Visit: Payer: Medicare PPO

## 2014-08-09 ENCOUNTER — Encounter: Payer: Self-pay | Admitting: Internal Medicine

## 2014-08-09 NOTE — Assessment & Plan Note (Addendum)
.  Improved but not at goal. Adding januvia 25 mg daily.  Referral to Dr Rolly Salter for GFR < 50. Increasing atorvastatin to 40 mg daily   Lab Results  Component Value Date   HGBA1C 7.7* 08/07/2014   Lab Results  Component Value Date   MICROALBUR 0.7 08/07/2014   Lab Results  Component Value Date   CHOL 217* 08/07/2014   HDL 63.30 08/07/2014   LDLCALC 125* 08/07/2014   LDLDIRECT 98.7 07/15/2013   TRIG 144.0 08/07/2014   CHOLHDL 3 08/07/2014

## 2014-08-09 NOTE — Assessment & Plan Note (Signed)
Well controlled on current statin therapy.   Liver enzymes are normal , no changes today.  Lab Results  Component Value Date   CHOL 217* 08/07/2014   HDL 63.30 08/07/2014   LDLCALC 125* 08/07/2014   LDLDIRECT 98.7 07/15/2013   TRIG 144.0 08/07/2014   CHOLHDL 3 08/07/2014   Lab Results  Component Value Date   ALT 20 08/07/2014   AST 24 08/07/2014   ALKPHOS 108 08/07/2014   BILITOT 0.6 08/07/2014

## 2014-08-09 NOTE — Assessment & Plan Note (Signed)
I have addressed  BMI and recommended wt loss of 10% of body weigh over the next 6 months using a low glycemic index diet and regular exercise a minimum of 5 days per week.   

## 2014-08-10 ENCOUNTER — Other Ambulatory Visit: Payer: Self-pay | Admitting: Internal Medicine

## 2014-08-10 ENCOUNTER — Encounter: Payer: Self-pay | Admitting: Internal Medicine

## 2014-08-10 DIAGNOSIS — N182 Chronic kidney disease, stage 2 (mild): Principal | ICD-10-CM

## 2014-08-10 DIAGNOSIS — E1122 Type 2 diabetes mellitus with diabetic chronic kidney disease: Secondary | ICD-10-CM

## 2014-08-10 DIAGNOSIS — E1129 Type 2 diabetes mellitus with other diabetic kidney complication: Secondary | ICD-10-CM

## 2014-08-10 DIAGNOSIS — E1165 Type 2 diabetes mellitus with hyperglycemia: Secondary | ICD-10-CM

## 2014-08-10 DIAGNOSIS — IMO0002 Reserved for concepts with insufficient information to code with codable children: Secondary | ICD-10-CM

## 2014-08-10 MED ORDER — ATORVASTATIN CALCIUM 40 MG PO TABS: 20.0000 mg | ORAL_TABLET | Freq: Every day | ORAL | Status: DC

## 2014-08-10 MED ORDER — SITAGLIPTIN PHOSPHATE 25 MG PO TABS: 25.0000 mg | ORAL_TABLET | Freq: Every day | ORAL | Status: DC

## 2014-08-10 NOTE — Assessment & Plan Note (Signed)
Improved but not at goal. Adding januvia 25 mg daily.  Referral to Dr Rolly Salter gor GFR < 50. Increasing atorvastatin to 40 mg daily

## 2014-09-05 ENCOUNTER — Other Ambulatory Visit: Payer: Self-pay | Admitting: Internal Medicine

## 2014-11-07 ENCOUNTER — Ambulatory Visit (INDEPENDENT_AMBULATORY_CARE_PROVIDER_SITE_OTHER): Payer: Medicare PPO | Admitting: Internal Medicine

## 2014-11-07 ENCOUNTER — Encounter: Payer: Self-pay | Admitting: Internal Medicine

## 2014-11-07 VITALS — BP 100/68 | HR 75 | Temp 97.9°F | Resp 16 | Ht 61.0 in | Wt 173.5 lb

## 2014-11-07 DIAGNOSIS — M25562 Pain in left knee: Secondary | ICD-10-CM

## 2014-11-07 DIAGNOSIS — G8929 Other chronic pain: Secondary | ICD-10-CM

## 2014-11-07 DIAGNOSIS — E1129 Type 2 diabetes mellitus with other diabetic kidney complication: Secondary | ICD-10-CM

## 2014-11-07 DIAGNOSIS — N183 Chronic kidney disease, stage 3 unspecified: Secondary | ICD-10-CM | POA: Insufficient documentation

## 2014-11-07 DIAGNOSIS — E1165 Type 2 diabetes mellitus with hyperglycemia: Secondary | ICD-10-CM

## 2014-11-07 DIAGNOSIS — E785 Hyperlipidemia, unspecified: Secondary | ICD-10-CM

## 2014-11-07 DIAGNOSIS — IMO0002 Reserved for concepts with insufficient information to code with codable children: Secondary | ICD-10-CM

## 2014-11-07 DIAGNOSIS — I10 Essential (primary) hypertension: Secondary | ICD-10-CM

## 2014-11-07 DIAGNOSIS — E669 Obesity, unspecified: Secondary | ICD-10-CM

## 2014-11-07 LAB — COMPREHENSIVE METABOLIC PANEL
ALT: 17 U/L (ref 0–35)
AST: 20 U/L (ref 0–37)
Albumin: 4.3 g/dL (ref 3.5–5.2)
Alkaline Phosphatase: 96 U/L (ref 39–117)
BUN: 58 mg/dL — ABNORMAL HIGH (ref 6–23)
CO2: 29 mEq/L (ref 19–32)
Calcium: 10.1 mg/dL (ref 8.4–10.5)
Chloride: 100 mEq/L (ref 96–112)
Creatinine, Ser: 1.51 mg/dL — ABNORMAL HIGH (ref 0.40–1.20)
GFR: 36.15 mL/min — ABNORMAL LOW (ref >60.00)
Glucose, Bld: 96 mg/dL (ref 70–99)
Potassium: 4.1 mEq/L (ref 3.5–5.1)
Sodium: 137 mEq/L (ref 135–145)
Total Bilirubin: 0.5 mg/dL (ref 0.2–1.2)
Total Protein: 7.8 g/dL (ref 6.0–8.3)

## 2014-11-07 LAB — LIPID PANEL
Cholesterol: 176 mg/dL (ref 0–200)
HDL: 77.5 mg/dL (ref >39.00)
LDL Cholesterol: 76 mg/dL (ref 0–99)
NonHDL: 98.5
Total CHOL/HDL Ratio: 2
Triglycerides: 114 mg/dL (ref 0.0–149.0)
VLDL: 22.8 mg/dL (ref 0.0–40.0)

## 2014-11-07 LAB — TSH: TSH: 2.69 u[IU]/mL (ref 0.35–4.50)

## 2014-11-07 LAB — HEMOGLOBIN A1C: Hgb A1c MFr Bld: 6.9 % — ABNORMAL HIGH (ref 4.6–6.5)

## 2014-11-07 NOTE — Progress Notes (Signed)
Pre-visit discussion using our clinic review tool. No additional management support is needed unless otherwise documented below in the visit note.  

## 2014-11-07 NOTE — Patient Instructions (Signed)
Food Basics for Chronic Kidney Disease When your kidneys are not working well, they cannot remove waste and excess substances from your blood as effectively as they did before. This can lead to a buildup and imbalance of these substances, which can affect how your body functions. This buildup can also make your kidneys work harder, causing even more damage. You may need to eat less of certain foods that can lead to the buildup of these substances in your body. By making the changes to your diet that are recommended by your dietitian or health care provider, you could possibly help prevent further kidney damage and delay or prevent the need for dialysis. The following information can help give you a basic understanding of these substances and how they affect your bodily functions. The information also gives examples of foods that contain the highest amounts of these substances. WHAT DO I NEED TO KNOW ABOUT SUBSTANCES IN MY FOOD THAT I MAY NEED TO ADJUST? Food adjustments will be different for each person with chronic kidney disease. It is important that you see a dietitian who can help you determine the specific adjustments that you will need to make for each of the following substances: Potassium Potassium affects how steadily your heart beats. If too much potassium builds up in your blood, it can cause an irregular heartbeat or even a heart attack. Examples of foods rich in potassium include:  Milk.  Fruits.  Vegetables. Phosphorus Phosphorus is a mineral found in your bones. A balance between calcium and phosphorous is needed to build and maintain healthy bones. Too much phosphorus pulls calcium from your bones. This can make your bones weak and more likely to break. Too much phosphorus can also make your skin itch. Examples of foods rich in phosphorus include:  Milk and cheese.  Dried beans.  Peas.  Colas.  Nuts and peanut butter. Animal Protein Animal protein helps you make and keep  muscle. It also helps in the repair of your body's cells and tissues. One of the natural breakdown products of protein is a waste product called urea. When your kidneys are not working properly, they cannot remove wastes such as urea like they did before you developed chronic kidney disease. You will likely need to limit the amount of protein you eat to help prevent a buildup of urea in your blood. Examples of animal protein include:  Meat (all types).  Fish and seafood.  Poultry.  Eggs. Sodium Sodium, which is found in salt, helps maintain a healthy balance of fluids in your body. Too much sodium can increase your blood pressure level and have a negative affect on the function of your heart and lungs. Too much sodium also can cause your body to retain too much fluid, making your kidneys work harder. Examples of foods with high levels of sodium include:  Salt seasonings.  Soy sauce.  Cured and processed meats.  Salted crackers and snack foods.  Fast food.  Canned soups and most canned foods. Glucose Glucose provides energy for your body. If you have diabetes mellitus that is not properly controlled, you have too much glucose in your blood. Too much glucose in your blood can worsen the function of your kidneys by damaging small blood vessels. This prevents enough blood flow to your kidneys to give them what they need to work. If you have diabetes mellitus and chronic kidney disease, it is important to maintain your blood glucose at a level recommended by your health care provider. SHOULD I   TAKE A VITAMIN AND MINERAL SUPPLEMENT? Because you may need to avoid eating certain foods, you may not get all of the vitamins and minerals that would normally come from those foods. Your health care provider or dietitian may recommend that you take a supplement to ensure that you get all of the vitamins and minerals that your body needs.  Document Released: 12/03/2002 Document Revised: 01/27/2014  Document Reviewed: 08/09/2013 [employer] Patient Information 2015 [employer]. This information is not intended to replace advice given to you by your health care provider. Make sure you discuss any questions you have with your health care provider.  

## 2014-11-07 NOTE — Progress Notes (Signed)
Patient ID: Sandra Hendrix, female   DOB: 1945/11/03, 69 y.o.   MRN: [id number]   Patient Active Problem List   Diagnosis Date Noted  . CKD (chronic kidney disease) stage 3, GFR 30-59 ml/min 11/07/2014  . UTI (urinary tract infection) 02/08/2014  . Acid reflux 12/26/2013  . Irritable bowel syndrome with constipation 12/26/2013  . Welcome to Medicare preventive visit 10/21/2013  . Hyperlipidemia LDL goal <100 07/15/2013  . Obesity 07/15/2013  . Chronic venous insufficiency 03/21/2013  . DM (diabetes mellitus), type 2, uncontrolled, with renal complications 63/14/9702  . Lack of libido 11/05/2012  . Barrett's esophagus 11/05/2012  . Hiatal hernia 11/05/2012  . Essential hypertension 11/05/2012  . Urinary incontinence     Subjective:  CC:   Chief Complaint  Patient presents with  . Follow-up  . Diabetes    HPI:   Sandra Hendrix is a 69 y.o. female who presents for  3 month follow up on type 2 DM,  hypertension and obesity ,  Lab Results  Component Value Date   HGBA1C 7.7* 08/07/2014   Wt Readings from Last 3 Encounters:  11/07/14 173 lb 8 oz (78.699 kg)  08/07/14 175 lb 8 oz (79.606 kg)  05/07/14 174 lb (78.926 kg)   She states that she feels generally well,  No sick visits .    Left knee bothering her for the last several months,  S/p TKR bilaterally,  Left one was replaced  in 2004 y Dr. Penelope Galas  [facility] Has not been dong anything in particular to aggravate it .  Hurts if stands or walks a lot,  Feels like it locks up  And more recently starting to have really intense pain .  No redness or warmth.  [place] and broke her wrist in Sept 2015 and thinks she may have injured it then   DM:  Fasting sugars 98 to usually over 130 ,  Sleeps late  So this is usually in the afternoon    Past Medical History  Diagnosis Date  . Diabetes mellitus without complication   . Hyperlipidemia   . Hypertension   . Arthritis   . Depression   . GERD (gastroesophageal reflux  disease)   . Allergy   . Urinary incontinence   . Varicose veins     Past Surgical History  Procedure Laterality Date  . Joint replacement Right 2007  . Joint replacement Left 2004       The following portions of the patient's history were reviewed and updated as appropriate: Allergies, current medications, and problem list.    Review of Systems:   Patient denies headache, fevers, malaise, unintentional weight loss, skin rash, eye pain, sinus congestion and sinus pain, sore throat, dysphagia,  hemoptysis , cough, dyspnea, wheezing, chest pain, palpitations, orthopnea, edema, abdominal pain, nausea, melena, diarrhea, constipation, flank pain, dysuria, hematuria, urinary  Frequency, nocturia, numbness, tingling, seizures,  Focal weakness, Loss of consciousness,  Tremor, insomnia, depression, anxiety, and suicidal ideation.     History   Social History  . Marital Status: Married    Spouse Name: N/A  . Number of Children: N/A  . Years of Education: N/A   Occupational History  . Not on file.   Social History Main Topics  . Smoking status: Never Smoker   . Smokeless tobacco: Not on file  . Alcohol Use: No  . Drug Use: No  . Sexual Activity: Not on file   Other Topics Concern  . Not on file  Social History Narrative    Objective:  Filed Vitals:   11/07/14 0923  BP: 100/68  Pulse: 75  Temp: 97.9 F (36.6 C)  Resp: 16     General appearance: alert, cooperative and appears stated age Ears: normal TM's and external ear canals both ears Throat: lips, mucosa, and tongue normal; teeth and gums normal Neck: no adenopathy, no carotid bruit, supple, symmetrical, trachea midline and thyroid not enlarged, symmetric, no tenderness/mass/nodules Back: symmetric, no curvature. ROM normal. No CVA tenderness. Lungs: clear to auscultation bilaterally Heart: regular rate and rhythm, S1, S2 normal, no murmur, click, rub or gallop Abdomen: soft, non-tender; bowel sounds normal;  no masses,  no organomegaly Pulses: 2+ and symmetric Skin: Skin color, texture, turgor normal. No rashes or lesions Lymph nodes: Cervical, supraclavicular, and axillary nodes normal.  Assessment and Plan:  DM (diabetes mellitus), type 2, uncontrolled, with renal complications .Improved , now  at goal with addition of Januvia 25 mg daily.  Referral to Dr Rolly Salter for GFR < 50.   Lab Results  Component Value Date   HGBA1C 6.9* 11/07/2014   Lab Results  Component Value Date   MICROALBUR 0.7 08/07/2014   Lab Results  Component Value Date   CHOL 176 11/07/2014   HDL 77.50 11/07/2014   LDLCALC 76 11/07/2014   LDLDIRECT 98.7 07/15/2013   TRIG 114.0 11/07/2014   CHOLHDL 2 11/07/2014          Hyperlipidemia LDL goal <100 LDL and triglycerides are at goal on current medications. She has no side effects and liver enzymes are normal. No changes today  Lab Results  Component Value Date   CHOL 176 11/07/2014   HDL 77.50 11/07/2014   LDLCALC 76 11/07/2014   LDLDIRECT 98.7 07/15/2013   TRIG 114.0 11/07/2014   CHOLHDL 2 11/07/2014   Lab Results  Component Value Date   ALT 17 11/07/2014   AST 20 11/07/2014   ALKPHOS 96 11/07/2014   BILITOT 0.5 11/07/2014       Essential hypertension Well controlled on current regimen. Renal function stable, no changes today.  Lab Results  Component Value Date   CREATININE 1.51* 11/07/2014   Lab Results  Component Value Date   NA 137 11/07/2014   K 4.1 11/07/2014   CL 100 11/07/2014   CO2 29 11/07/2014      CKD (chronic kidney disease) stage 3, GFR 30-59 ml/min She was referred to [facility] for GFR < 50,  Workup was notable only for a congenitally small right kidney   By U/S.  Lab Results  Component Value Date   MICROALBUR 0.7 08/07/2014       Updated Medication List Outpatient Encounter Prescriptions as of 11/07/2014  Medication Sig  . albuterol (PROVENTIL HFA;VENTOLIN HFA) 108 (90 BASE) MCG/ACT inhaler Inhale 2 puffs  into the lungs every 6 (six) hours as needed for wheezing.  . Ascorbic Acid 500 MG CHEW Chew by mouth.  Marland Kitchen aspirin 81 MG tablet Take 81 mg by mouth daily.  Marland Kitchen atorvastatin (LIPITOR) 40 MG tablet Take 0.5 tablets (20 mg total) by mouth daily.  Marland Kitchen azelastine (ASTEPRO) 137 MCG/SPRAY nasal spray Place 1 spray into the nose 2 (two) times daily. Use in each nostril as directed  . BIOTIN PO Take by mouth. Take 1000mg  by mouth twice daily.  . Black Cohosh (REMIFEMIN) 20 MG TABS Take 2 tablets by mouth daily.  . Calcium Carbonate-Vitamin D (CALCIUM 600 + D PO) Take 1 tablet by mouth daily.  . Cholecalciferol (  D3-1000 PO) Take 2 tablets in the am and 1 tablet in the pm.  . fish oil-omega-3 fatty acids 1000 MG capsule Take 1 capsule by mouth daily.  . fluticasone (FLONASE) 50 MCG/ACT nasal spray Place 1 spray into both nostrils daily.  . furosemide (LASIX) 80 MG tablet TAKE 1 TABLET TWICE DAILY  . GLIPIZIDE XL 10 MG 24 hr tablet TAKE 1 TABLET EVERY DAY  . Lancet Devices (ACCU-CHEK SOFTCLIX) lancets Use to test blood sugars 1 -2 times daily  . lisinopril (PRINIVIL,ZESTRIL) 20 MG tablet TAKE 1 TABLET EVERY DAY  . Meclizine HCl 25 MG CHEW Chew by mouth.  . metformin (FORTAMET) 500 MG (OSM) 24 hr tablet TAKE 2 TABLETS TWICE DAILY WITH MEALS  . Multiple Vitamin (MULTIVITAMIN) tablet Take 1 tablet by mouth daily.  . Omega-3 Fatty Acids (FISH OIL) 1000 MG CAPS Take by mouth.  Marland Kitchen omeprazole (PRILOSEC) 20 MG capsule TAKE 1 CAPSULE EVERY DAY  . Potassium 99 MG TABS Take 1 tablet by mouth daily.  . sertraline (ZOLOFT) 50 MG tablet TAKE 1 TABLET EVERY DAY  . sitaGLIPtin (JANUVIA) 25 MG tablet Take 1 tablet (25 mg total) by mouth daily.  . verapamil (VERELAN PM) 180 MG 24 hr capsule TAKE 1 CAPSULE EVERY DAY  . vitamin C (ASCORBIC ACID) 500 MG tablet Take 500 mg by mouth daily.  . Vitamin D, Ergocalciferol, (DRISDOL) 50000 UNITS CAPS capsule Take by mouth.     Orders Placed This Encounter  Procedures  .  Comprehensive metabolic panel  . Hemoglobin A1c  . Lipid panel  . TSH  . Ambulatory referral to Orthopedic Surgery    No Follow-up on file.

## 2014-11-09 ENCOUNTER — Encounter: Payer: Self-pay | Admitting: Internal Medicine

## 2014-11-09 NOTE — Assessment & Plan Note (Signed)
Well controlled on current regimen. Renal function stable, no changes today.  Lab Results  Component Value Date   CREATININE 1.51* 11/07/2014   Lab Results  Component Value Date   NA 137 11/07/2014   K 4.1 11/07/2014   CL 100 11/07/2014   CO2 29 11/07/2014

## 2014-11-09 NOTE — Assessment & Plan Note (Signed)
LDL and triglycerides are at goal on current medications. She has no side effects and liver enzymes are normal. No changes today  Lab Results  Component Value Date   CHOL 176 11/07/2014   HDL 77.50 11/07/2014   LDLCALC 76 11/07/2014   LDLDIRECT 98.7 07/15/2013   TRIG 114.0 11/07/2014   CHOLHDL 2 11/07/2014   Lab Results  Component Value Date   ALT 17 11/07/2014   AST 20 11/07/2014   ALKPHOS 96 11/07/2014   BILITOT 0.5 11/07/2014

## 2014-11-09 NOTE — Assessment & Plan Note (Signed)
.  Improved , now  at goal with addition of Januvia 25 mg daily.  Referral to Dr Rolly Salter for GFR < 50.   Lab Results  Component Value Date   HGBA1C 6.9* 11/07/2014   Lab Results  Component Value Date   MICROALBUR 0.7 08/07/2014   Lab Results  Component Value Date   CHOL 176 11/07/2014   HDL 77.50 11/07/2014   LDLCALC 76 11/07/2014   LDLDIRECT 98.7 07/15/2013   TRIG 114.0 11/07/2014   CHOLHDL 2 11/07/2014

## 2014-11-09 NOTE — Assessment & Plan Note (Signed)
She was referred to [facility] for GFR < 50,  Workup was notable only for a congenitally small right kidney   By U/S.  Lab Results  Component Value Date   MICROALBUR 0.7 08/07/2014

## 2014-11-10 ENCOUNTER — Telehealth: Payer: Self-pay | Admitting: Internal Medicine

## 2014-11-10 NOTE — Telephone Encounter (Signed)
Patient called to follow up on when she should come in for lab patient was advised one week after stopping furosemide patient stopped on 11/10/14 appointment made for 11/17/14 for lab.

## 2014-11-12 ENCOUNTER — Encounter: Payer: Self-pay | Admitting: Internal Medicine

## 2014-11-12 ENCOUNTER — Other Ambulatory Visit: Payer: Self-pay | Admitting: Internal Medicine

## 2014-11-12 DIAGNOSIS — N183 Chronic kidney disease, stage 3 unspecified: Secondary | ICD-10-CM

## 2014-11-17 ENCOUNTER — Other Ambulatory Visit: Payer: Medicare PPO

## 2014-11-17 ENCOUNTER — Encounter: Payer: Self-pay | Admitting: Internal Medicine

## 2014-11-17 NOTE — Telephone Encounter (Signed)
Patient stated she has only been taking One furosemide daily not 2 and that she had blood work completed at Dr. Holley Raring 's office today.

## 2014-11-18 LAB — HEPATIC FUNCTION PANEL
ALT: 19 U/L (ref 7–35)
AST: 16 U/L (ref 13–35)
Alkaline Phosphatase: 131 U/L — AB (ref 25–125)
Bilirubin, Total: 0.2 mg/dL

## 2014-11-18 LAB — BASIC METABOLIC PANEL
BUN: 21 mg/dL (ref 4–21)
Creatinine: 1.1 mg/dL (ref 0.5–1.1)
Glucose: 103 mg/dL
Potassium: 4.8 mmol/L (ref 3.4–5.3)
Sodium: 142 mmol/L (ref 137–147)

## 2014-11-18 NOTE — Telephone Encounter (Signed)
i still don't want her taking ANY because of her change in renal function

## 2014-11-20 ENCOUNTER — Telehealth: Payer: Self-pay | Admitting: Internal Medicine

## 2014-11-21 ENCOUNTER — Telehealth: Payer: Self-pay | Admitting: Internal Medicine

## 2014-11-28 ENCOUNTER — Encounter: Payer: Self-pay | Admitting: Internal Medicine

## 2014-11-28 DIAGNOSIS — Z96653 Presence of artificial knee joint, bilateral: Secondary | ICD-10-CM | POA: Diagnosis not present

## 2014-11-28 DIAGNOSIS — M25562 Pain in left knee: Secondary | ICD-10-CM | POA: Diagnosis not present

## 2014-11-28 DIAGNOSIS — M25561 Pain in right knee: Secondary | ICD-10-CM | POA: Diagnosis not present

## 2014-12-01 NOTE — Telephone Encounter (Signed)
wrong chart

## 2014-12-03 ENCOUNTER — Encounter
Admit: 2014-12-03 | Disposition: A | Discharge status: Home / Self Care | Payer: Self-pay | Attending: Surgical | Admitting: Surgical

## 2014-12-26 ENCOUNTER — Encounter
Admit: 2014-12-26 | Disposition: A | Discharge status: Home / Self Care | Payer: Self-pay | Attending: Surgical | Admitting: Surgical

## 2015-01-02 ENCOUNTER — Encounter: Payer: Self-pay | Admitting: Internal Medicine

## 2015-01-14 ENCOUNTER — Other Ambulatory Visit: Payer: Self-pay | Admitting: Internal Medicine

## 2015-01-19 ENCOUNTER — Encounter: Payer: Self-pay | Admitting: Internal Medicine

## 2015-01-30 ENCOUNTER — Telehealth: Payer: Self-pay | Admitting: Internal Medicine

## 2015-01-30 NOTE — Telephone Encounter (Signed)
She does not have to wait,  Til 4:30

## 2015-01-30 NOTE — Telephone Encounter (Signed)
The patient has a UTI and wants an appointment today,

## 2015-01-30 NOTE — Telephone Encounter (Signed)
Patient Name: Sandra Hendrix DOB: December 23, 1945 Initial Comment Caller states, she is passing blood in her urine Nurse Assessment Nurse: Ronnald Ramp, RN, Miranda Date/Time (Eastern Time): 01/30/2015 1:34:33 PM Confirm and document reason for call. If symptomatic, describe symptoms. ---Caller states she started having painful urination, frequency, and blood in her urine today. Has the patient traveled out of the country within the last 30 days? ---Not Applicable Does the patient require triage? ---Yes Related visit to physician within the last 2 weeks? ---No Does the PT have any chronic conditions? (i.e. diabetes, asthma, etc.) ---Yes List chronic conditions. ---Diabetes, Guidelines Guideline Title Affirmed Question Affirmed Notes Urine - Blood In Pain or burning with passing urine Final Disposition User See Physician within 24 Hours [place], Therapist, sports, [employer]

## 2015-01-30 NOTE — Telephone Encounter (Signed)
Patient was seen at urgent care.

## 2015-01-30 NOTE — Telephone Encounter (Signed)
Passing blood in urine sent patient to [facility] for triage.

## 2015-01-30 NOTE — Telephone Encounter (Signed)
Can i put patient at 4.30 PM

## 2015-02-09 LAB — HM DEXA SCAN: HM Dexa Scan: NORMAL

## 2015-02-10 ENCOUNTER — Ambulatory Visit: Payer: Medicare PPO | Admitting: Nurse Practitioner

## 2015-02-10 ENCOUNTER — Encounter: Payer: Self-pay | Admitting: Nurse Practitioner

## 2015-02-10 ENCOUNTER — Ambulatory Visit (INDEPENDENT_AMBULATORY_CARE_PROVIDER_SITE_OTHER): Payer: Medicare PPO | Admitting: Nurse Practitioner

## 2015-02-10 ENCOUNTER — Encounter: Payer: Self-pay | Admitting: *Deleted

## 2015-02-10 VITALS — BP 118/70 | HR 60 | Temp 97.4°F | Resp 12 | Ht 61.0 in | Wt 182.4 lb

## 2015-02-10 DIAGNOSIS — R05 Cough: Secondary | ICD-10-CM

## 2015-02-10 DIAGNOSIS — Z87440 Personal history of urinary (tract) infections: Secondary | ICD-10-CM

## 2015-02-10 DIAGNOSIS — R059 Cough, unspecified: Secondary | ICD-10-CM

## 2015-02-10 LAB — POCT URINALYSIS DIPSTICK
Bilirubin, UA: NEGATIVE
Blood, UA: NEGATIVE
Glucose, UA: NEGATIVE
Ketones, UA: NEGATIVE
Leukocytes, UA: NEGATIVE
Nitrite, UA: NEGATIVE
Protein, UA: NEGATIVE
Spec Grav, UA: 1.02
Urobilinogen, UA: 0.2
pH, UA: 5.5

## 2015-02-10 MED ORDER — AZITHROMYCIN 250 MG PO TABS: ORAL_TABLET | ORAL | Status: DC

## 2015-02-10 NOTE — Progress Notes (Signed)
   Subjective:    Patient ID: Sandra Hendrix, female    DOB: 09-17-1946, 69 y.o.   MRN: [id number]  HPI  Sandra Hendrix is a 69 yo female with CC of cough and drainage x 2 weeks.   1) Productive cough, PNDrip, nasal drainage, sore throat Treatment to date: Mucinex  Diabetic Tussin   2) Having Rotator cuff surgery next week. Just recently treated for a UTI wants to make sure it is okay.   Review of Systems  Constitutional: Negative for fever, chills, diaphoresis and fatigue.  HENT: Positive for congestion, postnasal drip, rhinorrhea and sore throat. Negative for ear discharge, ear pain, sinus pressure and sneezing.   Eyes: Negative for visual disturbance.  Respiratory: Positive for wheezing. Negative for chest tightness and shortness of breath.   Cardiovascular: Negative for chest pain, palpitations and leg swelling.  Gastrointestinal: Negative for nausea, vomiting and diarrhea.  Skin: Negative for rash.  Neurological: Negative for dizziness, weakness, numbness and headaches.  Psychiatric/Behavioral: The patient is not nervous/anxious.       Objective:   Physical Exam  Constitutional: She is oriented to person, place, and time. She appears well-developed and well-nourished. No distress.  BP 118/70 mmHg  Pulse 60  Temp(Src) 97.4 F (36.3 C) (Oral)  Resp 12  Ht 5\' 1"  (1.549 m)  Wt 182 lb 6.4 oz (82.736 kg)  BMI 34.48 kg/m2  SpO2 96%   HENT:  Head: Normocephalic and atraumatic.  Right Ear: External ear normal.  Left Ear: External ear normal.  Eyes: EOM are normal. Pupils are equal, round, and reactive to light. Right eye exhibits no discharge. Left eye exhibits no discharge. No scleral icterus.  Neck: Normal range of motion. Neck supple. No thyromegaly present.  Cardiovascular: Normal rate, regular rhythm, normal heart sounds and intact distal pulses.  Exam reveals no gallop and no friction rub.   No murmur heard. Pulmonary/Chest: Effort normal and breath sounds normal. No  respiratory distress. She has no wheezes. She has no rales. She exhibits no tenderness.  Lymphadenopathy:    She has no cervical adenopathy.  Neurological: She is alert and oriented to person, place, and time. No cranial nerve deficit. She exhibits normal muscle tone. Coordination normal.  Skin: Skin is warm and dry. No rash noted. She is not diaphoretic.  Psychiatric: She has a normal mood and affect. Her behavior is normal. Judgment and thought content normal.      Assessment & Plan:  Cough  1) Z-pack since having surgery next week and cough x 2 weeks 2) Probiotic encouraged  3) Continue with OTC medications.  4) FU prn worsening/failure to improve.

## 2015-02-10 NOTE — Progress Notes (Signed)
Pre visit review using our clinic review tool, if applicable. No additional management support is needed unless otherwise documented below in the visit note. 

## 2015-02-10 NOTE — Patient Instructions (Signed)
Please take a probiotic ( Align, Floraque or Culturelle) while you are on the antibiotic to prevent a serious antibiotic associated diarrhea  Called clostirudium dificile colitis and a vaginal yeast infection.   Continue with Mucinex and Diabetic Tussin.   Good luck with your surgery!

## 2015-02-10 NOTE — Assessment & Plan Note (Signed)
Recently treated. POCT urine reveals clean urine.

## 2015-02-11 ENCOUNTER — Telehealth: Payer: Self-pay | Admitting: Internal Medicine

## 2015-02-11 DIAGNOSIS — E785 Hyperlipidemia, unspecified: Secondary | ICD-10-CM

## 2015-02-11 DIAGNOSIS — N183 Chronic kidney disease, stage 3 unspecified: Secondary | ICD-10-CM

## 2015-02-11 DIAGNOSIS — E1165 Type 2 diabetes mellitus with hyperglycemia: Secondary | ICD-10-CM

## 2015-02-11 DIAGNOSIS — E1129 Type 2 diabetes mellitus with other diabetic kidney complication: Secondary | ICD-10-CM

## 2015-02-11 DIAGNOSIS — IMO0002 Reserved for concepts with insufficient information to code with codable children: Secondary | ICD-10-CM

## 2015-02-11 NOTE — Telephone Encounter (Signed)
Pt wanted to know if she needed to have blood work prior to her next visit. Please advise/msn

## 2015-02-11 NOTE — Telephone Encounter (Signed)
Her bone density test was normal.  Please remind her to continue regular weight bearing exercise daily and try to get 1200 mg calcium and 1000 units Vit D through diet and minimal supplements.  We will repeat the DEXA in 5 years

## 2015-02-11 NOTE — Telephone Encounter (Signed)
Next appointment is 03/03/15 last labs 2/16 please advise. If patient needs labs.

## 2015-02-11 NOTE — Telephone Encounter (Signed)
Patient notified and voiced understanding.

## 2015-02-12 NOTE — Telephone Encounter (Signed)
Yes, please call to make a fasting labs appt.  The labs have been ordered.

## 2015-02-12 NOTE — Telephone Encounter (Signed)
Lab appointment scheduled 

## 2015-02-16 ENCOUNTER — Other Ambulatory Visit (INDEPENDENT_AMBULATORY_CARE_PROVIDER_SITE_OTHER): Payer: Medicare PPO

## 2015-02-16 DIAGNOSIS — IMO0002 Reserved for concepts with insufficient information to code with codable children: Secondary | ICD-10-CM

## 2015-02-16 DIAGNOSIS — E1165 Type 2 diabetes mellitus with hyperglycemia: Secondary | ICD-10-CM

## 2015-02-16 DIAGNOSIS — N183 Chronic kidney disease, stage 3 unspecified: Secondary | ICD-10-CM

## 2015-02-16 DIAGNOSIS — E1129 Type 2 diabetes mellitus with other diabetic kidney complication: Secondary | ICD-10-CM

## 2015-02-16 DIAGNOSIS — E785 Hyperlipidemia, unspecified: Secondary | ICD-10-CM

## 2015-02-16 LAB — COMPREHENSIVE METABOLIC PANEL
ALT: 18 U/L (ref 0–35)
AST: 22 U/L (ref 0–37)
Albumin: 4.3 g/dL (ref 3.5–5.2)
Alkaline Phosphatase: 111 U/L (ref 39–117)
BUN: 21 mg/dL (ref 6–23)
CO2: 25 mEq/L (ref 19–32)
Calcium: 9.6 mg/dL (ref 8.4–10.5)
Chloride: 105 mEq/L (ref 96–112)
Creatinine, Ser: 1.07 mg/dL (ref 0.40–1.20)
GFR: 53.75 mL/min — ABNORMAL LOW (ref >60.00)
Glucose, Bld: 119 mg/dL — ABNORMAL HIGH (ref 70–99)
Potassium: 4.2 mEq/L (ref 3.5–5.1)
Sodium: 136 mEq/L (ref 135–145)
Total Bilirubin: 0.4 mg/dL (ref 0.2–1.2)
Total Protein: 7.5 g/dL (ref 6.0–8.3)

## 2015-02-16 LAB — RENAL FUNCTION PANEL
Albumin: 4.3 g/dL (ref 3.5–5.2)
BUN: 21 mg/dL (ref 6–23)
CO2: 25 mEq/L (ref 19–32)
Calcium: 9.6 mg/dL (ref 8.4–10.5)
Chloride: 105 mEq/L (ref 96–112)
Creatinine, Ser: 1.07 mg/dL (ref 0.40–1.20)
GFR: 53.75 mL/min — ABNORMAL LOW (ref >60.00)
Glucose, Bld: 119 mg/dL — ABNORMAL HIGH (ref 70–99)
Phosphorus: 3.9 mg/dL (ref 2.3–4.6)
Potassium: 4.2 mEq/L (ref 3.5–5.1)
Sodium: 136 mEq/L (ref 135–145)

## 2015-02-16 LAB — LIPID PANEL
Cholesterol: 199 mg/dL (ref 0–200)
Triglycerides: 96 mg/dL (ref 0.0–149.0)
VLDL: 19.2 mg/dL (ref 0.0–40.0)

## 2015-02-16 LAB — MICROALBUMIN / CREATININE URINE RATIO
Creatinine,U: 123.1 mg/dL
Microalb Creat Ratio: 0.6 mg/g (ref 0.0–30.0)
Microalb, Ur: 0.7 mg/dL (ref 0.0–1.9)

## 2015-02-16 LAB — HEMOGLOBIN A1C: Hgb A1c MFr Bld: 6.7 % — ABNORMAL HIGH (ref 4.6–6.5)

## 2015-02-17 DIAGNOSIS — M75121 Complete rotator cuff tear or rupture of right shoulder, not specified as traumatic: Secondary | ICD-10-CM | POA: Diagnosis not present

## 2015-02-17 DIAGNOSIS — M19011 Primary osteoarthritis, right shoulder: Secondary | ICD-10-CM | POA: Diagnosis not present

## 2015-02-17 DIAGNOSIS — S43431A Superior glenoid labrum lesion of right shoulder, initial encounter: Secondary | ICD-10-CM | POA: Diagnosis not present

## 2015-02-17 DIAGNOSIS — M7541 Impingement syndrome of right shoulder: Secondary | ICD-10-CM | POA: Diagnosis not present

## 2015-02-17 DIAGNOSIS — S46011A Strain of muscle(s) and tendon(s) of the rotator cuff of right shoulder, initial encounter: Secondary | ICD-10-CM | POA: Diagnosis not present

## 2015-02-18 ENCOUNTER — Encounter: Payer: Self-pay | Admitting: Internal Medicine

## 2015-02-20 ENCOUNTER — Encounter: Payer: Self-pay | Admitting: Emergency Medicine

## 2015-02-20 DIAGNOSIS — I10 Essential (primary) hypertension: Secondary | ICD-10-CM | POA: Diagnosis present

## 2015-02-20 DIAGNOSIS — E1165 Type 2 diabetes mellitus with hyperglycemia: Secondary | ICD-10-CM | POA: Diagnosis present

## 2015-02-20 DIAGNOSIS — K913 Postprocedural intestinal obstruction: Secondary | ICD-10-CM | POA: Diagnosis present

## 2015-02-20 DIAGNOSIS — E1129 Type 2 diabetes mellitus with other diabetic kidney complication: Secondary | ICD-10-CM | POA: Diagnosis present

## 2015-02-20 DIAGNOSIS — K598 Other specified functional intestinal disorders: Secondary | ICD-10-CM | POA: Diagnosis not present

## 2015-02-20 DIAGNOSIS — Z7951 Long term (current) use of inhaled steroids: Secondary | ICD-10-CM

## 2015-02-20 DIAGNOSIS — K5900 Constipation, unspecified: Secondary | ICD-10-CM | POA: Diagnosis not present

## 2015-02-20 DIAGNOSIS — K219 Gastro-esophageal reflux disease without esophagitis: Secondary | ICD-10-CM | POA: Diagnosis present

## 2015-02-20 DIAGNOSIS — F329 Major depressive disorder, single episode, unspecified: Secondary | ICD-10-CM | POA: Diagnosis present

## 2015-02-20 DIAGNOSIS — Y838 Other surgical procedures as the cause of abnormal reaction of the patient, or of later complication, without mention of misadventure at the time of the procedure: Secondary | ICD-10-CM | POA: Diagnosis present

## 2015-02-20 DIAGNOSIS — Z79899 Other long term (current) drug therapy: Secondary | ICD-10-CM

## 2015-02-20 DIAGNOSIS — Z7982 Long term (current) use of aspirin: Secondary | ICD-10-CM

## 2015-02-20 DIAGNOSIS — E785 Hyperlipidemia, unspecified: Secondary | ICD-10-CM | POA: Diagnosis present

## 2015-02-20 DIAGNOSIS — N8320 Unspecified ovarian cysts: Secondary | ICD-10-CM | POA: Diagnosis not present

## 2015-02-20 DIAGNOSIS — R112 Nausea with vomiting, unspecified: Secondary | ICD-10-CM | POA: Diagnosis present

## 2015-02-20 LAB — CBC WITH DIFFERENTIAL/PLATELET
Basophils Absolute: 0 10*3/uL (ref 0–0.1)
Basophils Relative: 0 %
Eosinophils Absolute: 0 10*3/uL (ref 0–0.7)
Eosinophils Relative: 1 %
HCT: 39.7 % (ref 35.0–47.0)
Hemoglobin: 12.9 g/dL (ref 12.0–16.0)
Lymphocytes Relative: 3 %
Lymphs Abs: 0.2 10*3/uL — ABNORMAL LOW (ref 1.0–3.6)
MCH: 29.3 pg (ref 26.0–34.0)
MCHC: 32.6 g/dL (ref 32.0–36.0)
MCV: 89.9 fL (ref 80.0–100.0)
Monocytes Absolute: 0.4 10*3/uL (ref 0.2–0.9)
Monocytes Relative: 6 %
Neutro Abs: 5.9 10*3/uL (ref 1.4–6.5)
Neutrophils Relative %: 90 %
Platelets: 280 10*3/uL (ref 150–440)
RBC: 4.41 MIL/uL (ref 3.80–5.20)
RDW: 14.5 % (ref 11.5–14.5)
WBC: 6.6 10*3/uL (ref 3.6–11.0)

## 2015-02-20 LAB — COMPREHENSIVE METABOLIC PANEL
ALT: 187 U/L — ABNORMAL HIGH (ref 14–54)
AST: 60 U/L — ABNORMAL HIGH (ref 15–41)
Albumin: 3.8 g/dL (ref 3.5–5.0)
Alkaline Phosphatase: 114 U/L (ref 38–126)
Anion gap: 10 (ref 5–15)
BUN: 19 mg/dL (ref 6–20)
CO2: 23 mmol/L (ref 22–32)
Calcium: 9.4 mg/dL (ref 8.9–10.3)
Chloride: 102 mmol/L (ref 101–111)
Creatinine, Ser: 0.91 mg/dL (ref 0.44–1.00)
GFR calc Af Amer: >60 mL/min (ref >60)
GFR calc non Af Amer: >60 mL/min (ref >60)
Glucose, Bld: 223 mg/dL — ABNORMAL HIGH (ref 65–99)
Potassium: 4.4 mmol/L (ref 3.5–5.1)
Sodium: 135 mmol/L (ref 135–145)
Total Bilirubin: 0.5 mg/dL (ref 0.3–1.2)
Total Protein: 7.5 g/dL (ref 6.5–8.1)

## 2015-02-20 LAB — URINALYSIS COMPLETE WITH MICROSCOPIC (ARMC ONLY)
Bacteria, UA: NONE SEEN
Bilirubin Urine: NEGATIVE
Glucose, UA: 50 mg/dL — AB
Hgb urine dipstick: NEGATIVE
Ketones, ur: NEGATIVE mg/dL
Leukocytes, UA: NEGATIVE
Nitrite: NEGATIVE
Protein, ur: 30 mg/dL — AB
Specific Gravity, Urine: 1.028 (ref 1.005–1.030)
pH: 5 (ref 5.0–8.0)

## 2015-02-20 LAB — LIPASE, BLOOD: Lipase: 40 U/L (ref 22–51)

## 2015-02-20 NOTE — ED Notes (Signed)
Pt presents to ED with c/o vomiting tonight. Pt reports that she had rotator cuff (right side) surgery on Tuesday morning and feels like she may be constipated. Pt reports that she had a bowel movement tonight but reports that she still feels somewhat constipated. Pt reports that she vomited for three hours and thought she should come in to make sure she's not dehydrated. Pt reports that she has been taking oxycodone for pain control which was working until this afternoon. Pt reports that she has been taking a stool softener but feels that it may not be strong enough. Pt is awake and alert during triage.

## 2015-02-21 ENCOUNTER — Inpatient Hospital Stay
Admission: EM | Admit: 2015-02-21 | Discharge: 2015-02-24 | DRG: 395 | Disposition: A | Discharge status: Home / Self Care | Payer: Medicare PPO | Attending: Internal Medicine | Admitting: Internal Medicine

## 2015-02-21 ENCOUNTER — Emergency Department: Payer: Medicare PPO

## 2015-02-21 DIAGNOSIS — Z79899 Other long term (current) drug therapy: Secondary | ICD-10-CM | POA: Diagnosis not present

## 2015-02-21 DIAGNOSIS — F329 Major depressive disorder, single episode, unspecified: Secondary | ICD-10-CM | POA: Diagnosis present

## 2015-02-21 DIAGNOSIS — I10 Essential (primary) hypertension: Secondary | ICD-10-CM | POA: Diagnosis present

## 2015-02-21 DIAGNOSIS — Z7951 Long term (current) use of inhaled steroids: Secondary | ICD-10-CM | POA: Diagnosis not present

## 2015-02-21 DIAGNOSIS — Z7982 Long term (current) use of aspirin: Secondary | ICD-10-CM | POA: Diagnosis not present

## 2015-02-21 DIAGNOSIS — K9189 Other postprocedural complications and disorders of digestive system: Secondary | ICD-10-CM

## 2015-02-21 DIAGNOSIS — Y838 Other surgical procedures as the cause of abnormal reaction of the patient, or of later complication, without mention of misadventure at the time of the procedure: Secondary | ICD-10-CM | POA: Diagnosis present

## 2015-02-21 DIAGNOSIS — E785 Hyperlipidemia, unspecified: Secondary | ICD-10-CM | POA: Diagnosis present

## 2015-02-21 DIAGNOSIS — K913 Postprocedural intestinal obstruction: Secondary | ICD-10-CM | POA: Diagnosis present

## 2015-02-21 DIAGNOSIS — K567 Ileus, unspecified: Secondary | ICD-10-CM | POA: Diagnosis present

## 2015-02-21 DIAGNOSIS — R112 Nausea with vomiting, unspecified: Secondary | ICD-10-CM | POA: Diagnosis present

## 2015-02-21 DIAGNOSIS — N183 Chronic kidney disease, stage 3 unspecified: Secondary | ICD-10-CM | POA: Diagnosis present

## 2015-02-21 DIAGNOSIS — E1122 Type 2 diabetes mellitus with diabetic chronic kidney disease: Secondary | ICD-10-CM | POA: Diagnosis present

## 2015-02-21 DIAGNOSIS — E1129 Type 2 diabetes mellitus with other diabetic kidney complication: Secondary | ICD-10-CM | POA: Diagnosis present

## 2015-02-21 DIAGNOSIS — K219 Gastro-esophageal reflux disease without esophagitis: Secondary | ICD-10-CM | POA: Diagnosis present

## 2015-02-21 DIAGNOSIS — R198 Other specified symptoms and signs involving the digestive system and abdomen: Secondary | ICD-10-CM

## 2015-02-21 DIAGNOSIS — E1165 Type 2 diabetes mellitus with hyperglycemia: Secondary | ICD-10-CM | POA: Diagnosis present

## 2015-02-21 LAB — GLUCOSE, CAPILLARY
Glucose-Capillary: 114 mg/dL — ABNORMAL HIGH (ref 65–99)
Glucose-Capillary: 164 mg/dL — ABNORMAL HIGH (ref 65–99)
Glucose-Capillary: 140 mg/dL — ABNORMAL HIGH (ref 65–99)

## 2015-02-21 MED ORDER — PANTOPRAZOLE SODIUM 40 MG PO TBEC
40.0000 mg | DELAYED_RELEASE_TABLET | Freq: Every day | ORAL | Status: DC
  Administered 2015-02-21 – 2015-02-22 (×2): 40 mg via ORAL
  Filled 2015-02-21 (×2): qty 1

## 2015-02-21 MED ORDER — SODIUM CHLORIDE 0.9 % IV SOLN
INTRAVENOUS | Status: AC
  Administered 2015-02-21 – 2015-02-22 (×3): via INTRAVENOUS

## 2015-02-21 MED ORDER — ONDANSETRON HCL 4 MG/2ML IJ SOLN
INTRAMUSCULAR | Status: AC
  Filled 2015-02-21: qty 2

## 2015-02-21 MED ORDER — ONDANSETRON HCL 4 MG/2ML IJ SOLN
4.0000 mg | Freq: Once | INTRAMUSCULAR | Status: AC
  Administered 2015-02-21: 4 mg via INTRAVENOUS

## 2015-02-21 MED ORDER — ACETAMINOPHEN 500 MG PO TABS
ORAL_TABLET | ORAL | Status: AC
  Filled 2015-02-21: qty 2

## 2015-02-21 MED ORDER — ACETAMINOPHEN 500 MG PO TABS
1000.0000 mg | ORAL_TABLET | ORAL | Status: AC
  Administered 2015-02-21: 1000 mg via ORAL

## 2015-02-21 MED ORDER — ACETAMINOPHEN 650 MG RE SUPP: 650.0000 mg | Freq: Four times a day (QID) | RECTAL | Status: DC | PRN

## 2015-02-21 MED ORDER — OMEGA-3-ACID ETHYL ESTERS 1 G PO CAPS
1.0000 g | ORAL_CAPSULE | Freq: Every day | ORAL | Status: DC
  Administered 2015-02-21 – 2015-02-24 (×4): 1 g via ORAL
  Filled 2015-02-21 (×4): qty 1

## 2015-02-21 MED ORDER — ACETAMINOPHEN 325 MG PO TABS
650.0000 mg | ORAL_TABLET | Freq: Four times a day (QID) | ORAL | Status: DC | PRN
  Administered 2015-02-21 – 2015-02-24 (×7): 650 mg via ORAL
  Filled 2015-02-21 (×7): qty 2

## 2015-02-21 MED ORDER — LISINOPRIL 20 MG PO TABS
20.0000 mg | ORAL_TABLET | Freq: Every day | ORAL | Status: DC
  Administered 2015-02-21 – 2015-02-24 (×4): 20 mg via ORAL
  Filled 2015-02-21 (×4): qty 1

## 2015-02-21 MED ORDER — FLEET ENEMA 7-19 GM/118ML RE ENEM
1.0000 | ENEMA | Freq: Once | RECTAL | Status: AC
  Administered 2015-02-21: 1 via RECTAL

## 2015-02-21 MED ORDER — ALBUTEROL SULFATE HFA 108 (90 BASE) MCG/ACT IN AERS: 2.0000 | INHALATION_SPRAY | Freq: Four times a day (QID) | RESPIRATORY_TRACT | Status: DC | PRN

## 2015-02-21 MED ORDER — ASPIRIN EC 81 MG PO TBEC
81.0000 mg | DELAYED_RELEASE_TABLET | Freq: Every day | ORAL | Status: DC
  Administered 2015-02-21 – 2015-02-24 (×4): 81 mg via ORAL
  Filled 2015-02-21 (×4): qty 1

## 2015-02-21 MED ORDER — ONDANSETRON HCL 4 MG/2ML IJ SOLN
4.0000 mg | Freq: Once | INTRAMUSCULAR | Status: AC
  Administered 2015-02-21: 4 mg via INTRAVENOUS
  Filled 2015-02-21: qty 2

## 2015-02-21 MED ORDER — FLUTICASONE PROPIONATE 50 MCG/ACT NA SUSP
1.0000 | Freq: Every day | NASAL | Status: DC
  Administered 2015-02-21 – 2015-02-24 (×4): 1 via NASAL
  Filled 2015-02-21: qty 16

## 2015-02-21 MED ORDER — ASPIRIN 81 MG PO TABS: 81.0000 mg | ORAL_TABLET | Freq: Every day | ORAL | Status: DC

## 2015-02-21 MED ORDER — IOHEXOL 240 MG/ML SOLN
25.0000 mL | Freq: Once | INTRAMUSCULAR | Status: AC | PRN
  Administered 2015-02-21: 25 mL via ORAL

## 2015-02-21 MED ORDER — SERTRALINE HCL 50 MG PO TABS
50.0000 mg | ORAL_TABLET | Freq: Every day | ORAL | Status: DC
  Administered 2015-02-21 – 2015-02-24 (×4): 50 mg via ORAL
  Filled 2015-02-21 (×4): qty 1

## 2015-02-21 MED ORDER — IOHEXOL 300 MG/ML  SOLN
100.0000 mL | Freq: Once | INTRAMUSCULAR | Status: AC | PRN
  Administered 2015-02-21: 100 mL via INTRAVENOUS

## 2015-02-21 MED ORDER — INSULIN ASPART 100 UNIT/ML ~~LOC~~ SOLN
0.0000 [IU] | SUBCUTANEOUS | Status: DC
Start: 2015-02-21 — End: 2015-02-24
  Administered 2015-02-21: 1 [IU] via SUBCUTANEOUS
  Administered 2015-02-21 – 2015-02-22 (×2): 2 [IU] via SUBCUTANEOUS
  Administered 2015-02-22: 1 [IU] via SUBCUTANEOUS
  Administered 2015-02-22: 2 [IU] via SUBCUTANEOUS
  Administered 2015-02-22 – 2015-02-23 (×2): 1 [IU] via SUBCUTANEOUS
  Administered 2015-02-23: 2 [IU] via SUBCUTANEOUS
  Administered 2015-02-23: 1 [IU] via SUBCUTANEOUS
  Administered 2015-02-24: 2 [IU] via SUBCUTANEOUS
  Administered 2015-02-24: 1 [IU] via SUBCUTANEOUS
  Filled 2015-02-21: qty 2
  Filled 2015-02-21: qty 3
  Filled 2015-02-21 (×2): qty 1
  Filled 2015-02-21 (×2): qty 2
  Filled 2015-02-21: qty 1
  Filled 2015-02-21 (×2): qty 2
  Filled 2015-02-21: qty 1
  Filled 2015-02-21: qty 2

## 2015-02-21 MED ORDER — NAPROXEN 500 MG PO TABS
ORAL_TABLET | ORAL | Status: AC
  Filled 2015-02-21: qty 1

## 2015-02-21 MED ORDER — ONDANSETRON HCL 4 MG/2ML IJ SOLN
4.0000 mg | Freq: Four times a day (QID) | INTRAMUSCULAR | Status: DC | PRN
  Administered 2015-02-21: 4 mg via INTRAVENOUS
  Filled 2015-02-21 (×2): qty 2

## 2015-02-21 MED ORDER — SODIUM CHLORIDE 0.9 % IV BOLUS (SEPSIS)
1000.0000 mL | Freq: Once | INTRAVENOUS | Status: AC
  Administered 2015-02-21: 1000 mL via INTRAVENOUS

## 2015-02-21 MED ORDER — DOCUSATE SODIUM 100 MG PO CAPS
100.0000 mg | ORAL_CAPSULE | Freq: Two times a day (BID) | ORAL | Status: DC
  Administered 2015-02-21 – 2015-02-22 (×3): 100 mg via ORAL
  Filled 2015-02-21 (×3): qty 1

## 2015-02-21 MED ORDER — ALBUTEROL SULFATE (2.5 MG/3ML) 0.083% IN NEBU: 2.5000 mg | INHALATION_SOLUTION | Freq: Four times a day (QID) | RESPIRATORY_TRACT | Status: DC | PRN

## 2015-02-21 MED ORDER — GLIPIZIDE ER 10 MG PO TB24
10.0000 mg | ORAL_TABLET | Freq: Every day | ORAL | Status: DC
  Administered 2015-02-21 – 2015-02-22 (×2): 10 mg via ORAL
  Filled 2015-02-21 (×2): qty 1

## 2015-02-21 MED ORDER — ATORVASTATIN CALCIUM 20 MG PO TABS
20.0000 mg | ORAL_TABLET | Freq: Every day | ORAL | Status: DC
  Administered 2015-02-21 – 2015-02-24 (×4): 20 mg via ORAL
  Filled 2015-02-21 (×4): qty 1

## 2015-02-21 MED ORDER — OMEGA-3 FATTY ACIDS 1000 MG PO CAPS: 1.0000 | ORAL_CAPSULE | Freq: Every day | ORAL | Status: DC

## 2015-02-21 MED ORDER — VERAPAMIL HCL ER 180 MG PO CP24
180.0000 mg | ORAL_CAPSULE | Freq: Every day | ORAL | Status: DC
  Administered 2015-02-22 – 2015-02-24 (×3): 180 mg via ORAL
  Filled 2015-02-21: qty 1

## 2015-02-21 MED ORDER — ONDANSETRON HCL 4 MG PO TABS: 4.0000 mg | ORAL_TABLET | Freq: Four times a day (QID) | ORAL | Status: DC | PRN

## 2015-02-21 MED ORDER — ENOXAPARIN SODIUM 40 MG/0.4ML ~~LOC~~ SOLN: 40.0000 mg | SUBCUTANEOUS | Status: DC

## 2015-02-21 MED ORDER — AZELASTINE HCL 0.1 % NA SOLN
1.0000 | Freq: Two times a day (BID) | NASAL | Status: DC
  Administered 2015-02-21 – 2015-02-24 (×7): 1 via NASAL
  Filled 2015-02-21: qty 30

## 2015-02-21 MED ORDER — NAPROXEN 500 MG PO TABS
250.0000 mg | ORAL_TABLET | ORAL | Status: AC
  Administered 2015-02-21: 250 mg via ORAL

## 2015-02-21 NOTE — Progress Notes (Signed)
Dr. Volanda Napoleon notified of pt continuing to have nausea. MD ordered to give another dose of zofran now and see if it relieves pt.

## 2015-02-21 NOTE — ED Provider Notes (Signed)
[facility] Emergency Department Provider Note  ____________________________________________  Time seen: Approximately 2:55 AM  I have reviewed the triage vital signs and the nursing notes.   HISTORY  Chief Complaint Post-op Problem    [name] is a 69 y.o. female who presents with vomiting and severe constipation. Patient states she has not had a decent bowel movement since surgery on Tuesday, she has been on oxycodone, and feels she is quite constipated. Today got to the point she became nauseated and has vomited up several times unable to keep down food. In addition she does not think she has passed any gas in the last 24 hours. She states that she is having ongoing achy crampy upper abdominal pain. She does not have any pain around the rectum or an urge to go.  No fevers or chills. She feels that she is healing well at her surgical sites. She denies any chest pain or shortness of breath. No cough. No pain or burning with urination.  She has a prior history of cholecystectomy reports no other abdominal surgeries.   Past Medical History  Diagnosis Date  . Diabetes mellitus without complication   . Hyperlipidemia   . Hypertension   . Arthritis   . Depression   . GERD (gastroesophageal reflux disease)   . Allergy   . Urinary incontinence   . Varicose veins     Patient Active Problem List   Diagnosis Date Noted  . CKD (chronic kidney disease) stage 3, GFR 30-59 ml/min 11/07/2014  . UTI (urinary tract infection) 02/08/2014  . Acid reflux 12/26/2013  . Irritable bowel syndrome with constipation 12/26/2013  . Welcome to Medicare preventive visit 10/21/2013  . Hyperlipidemia LDL goal <100 07/15/2013  . Obesity 07/15/2013  . Chronic venous insufficiency 03/21/2013  . DM (diabetes mellitus), type 2, uncontrolled, with renal complications 21/19/4174  . Lack of libido 11/05/2012  . Barrett's esophagus 11/05/2012  . Hiatal hernia 11/05/2012   . Essential hypertension 11/05/2012  . Urinary incontinence     Past Surgical History  Procedure Laterality Date  . Joint replacement Right 2007  . Joint replacement Left 2004  . Rotator cuff repair Right     Current Outpatient Rx  Name  Route  Sig  Dispense  Refill  . albuterol (PROVENTIL HFA;VENTOLIN HFA) 108 (90 BASE) MCG/ACT inhaler   Inhalation   Inhale 2 puffs into the lungs every 6 (six) hours as needed for wheezing.         . Ascorbic Acid 500 MG CHEW   Oral   Chew by mouth.         Marland Kitchen aspirin 81 MG tablet   Oral   Take 81 mg by mouth daily.         Marland Kitchen atorvastatin (LIPITOR) 40 MG tablet   Oral   Take 0.5 tablets (20 mg total) by mouth daily.   90 tablet   1   . azelastine (ASTEPRO) 137 MCG/SPRAY nasal spray   Nasal   Place 1 spray into the nose 2 (two) times daily. Use in each nostril as directed   30 mL   11   . azithromycin (ZITHROMAX) 250 MG tablet      Take 2 tablets by mouth on day 1, take 1 tablet by mouth each day after for 4 days.   6 each   0   . BIOTIN PO   Oral   Take by mouth. Take 1000mg  by mouth twice daily.         Marland Kitchen  Black Cohosh (REMIFEMIN) 20 MG TABS   Oral   Take 2 tablets by mouth daily.         . Calcium Carbonate-Vitamin D (CALCIUM 600 + D PO)   Oral   Take 1 tablet by mouth daily.         . Cholecalciferol (D3-1000 PO)      Take 2 tablets in the am and 1 tablet in the pm.         . fish oil-omega-3 fatty acids 1000 MG capsule   Oral   Take 1 capsule by mouth daily.         . fluticasone (FLONASE) 50 MCG/ACT nasal spray   Each Nare   Place 1 spray into both nostrils daily.   48 g   1   . GLIPIZIDE XL 10 MG 24 hr tablet      TAKE 1 TABLET EVERY DAY   90 tablet   1   . Lancet Devices (ACCU-CHEK SOFTCLIX) lancets      Use to test blood sugars 1 -2 times daily   1 each   3   . lisinopril (PRINIVIL,ZESTRIL) 20 MG tablet      TAKE 1 TABLET EVERY DAY   90 tablet   1   . Meclizine HCl 25 MG  CHEW   Oral   Chew by mouth.         . metformin (FORTAMET) 500 MG (OSM) 24 hr tablet      TAKE 2 TABLETS TWICE DAILY WITH MEALS   360 tablet   1   . Multiple Vitamin (MULTIVITAMIN) tablet   Oral   Take 1 tablet by mouth daily.         . Omega-3 Fatty Acids (FISH OIL) 1000 MG CAPS   Oral   Take by mouth.         Marland Kitchen omeprazole (PRILOSEC) 20 MG capsule      TAKE 1 CAPSULE EVERY DAY   90 capsule   1   . Potassium 99 MG TABS   Oral   Take 1 tablet by mouth daily.         . sertraline (ZOLOFT) 50 MG tablet      TAKE 1 TABLET EVERY DAY   90 tablet   2   . sitaGLIPtin (JANUVIA) 25 MG tablet   Oral   Take 1 tablet (25 mg total) by mouth daily.   90 tablet   1   . verapamil (VERELAN PM) 180 MG 24 hr capsule      TAKE 1 CAPSULE EVERY DAY   90 capsule   1   . vitamin C (ASCORBIC ACID) 500 MG tablet   Oral   Take 500 mg by mouth daily.         . Vitamin D, Ergocalciferol, (DRISDOL) 50000 UNITS CAPS capsule   Oral   Take by mouth.           Allergies Clindamycin/lincomycin; Codeine; Morphine and related; and Talwin  Family History  Problem Relation Age of Onset  . Stroke Mother   . Hypertension Mother   . Lupus Mother   . Alcohol abuse Father   . Diabetes Father   . Arthritis Sister   . Diabetes Sister   . Hyperlipidemia Daughter   . Hypertension Daughter   . Mental retardation Daughter   . Cancer Maternal Aunt     ovarian ca  . Diabetes Paternal Aunt   . Drug abuse Paternal Aunt   .  Cancer Maternal Grandfather     colon ca    Social History History  Substance Use Topics  . Smoking status: Never Smoker   . Smokeless tobacco: Not on file  . Alcohol Use: No    Review of Systems Constitutional: No fever/chills Eyes: No visual changes. ENT: No sore throat. Cardiovascular: Denies chest pain. Respiratory: Denies shortness of breath. Gastrointestinal: See history of present illness Genitourinary: Negative for  dysuria. Musculoskeletal: Negative for back pain. Skin: Negative for rash. Neurological: Negative for headaches, focal weakness or numbness.  10-point ROS otherwise negative.  ____________________________________________   PHYSICAL EXAM:  VITAL SIGNS: ED Triage Vitals  Enc Vitals Group     BP 02/20/15 2113 137/79 mmHg     Pulse Rate 02/20/15 2113 95     Resp 02/20/15 2113 18     Temp 02/20/15 2113 97.6 F (36.4 C)     Temp Source 02/20/15 2113 Oral     SpO2 02/20/15 2113 96 %     Weight 02/20/15 2113 180 lb (81.647 kg)     Height 02/20/15 2113 5\' 1"  (1.549 m)     Head Cir --      Peak Flow --      Pain Score 02/20/15 2114 4     Pain Loc --      Pain Edu? --      Excl. in [place]? --     Constitutional: Alert and oriented. Well appearing and in no acute distress. Eyes: Conjunctivae are normal. PERRL. EOMI. Head: Atraumatic. Nose: No congestion/rhinnorhea. Mouth/Throat: Mucous membranes are moist.  Oropharynx non-erythematous. Neck: No stridor.   Cardiovascular: Normal rate, regular rhythm. Grossly normal heart sounds.  Good peripheral circulation. Respiratory: Normal respiratory effort.  No retractions. Lungs CTAB. Gastrointestinal: Soft and nontender except for mild discomfort to palpation across the upper abdomen bilaterally. Negative Percell Miller there is slight distention, no tense. . No abdominal bruits. No CVA tenderness. Bowel sounds are present. Musculoskeletal: No lower extremity tenderness nor edema.  No joint effusions. Tenderness over the right shoulder, however surgical sites are all clean dry and intact with no surrounding erythema or evidence of infection Neurologic:  Normal speech and language. No gross focal neurologic deficits are appreciated. Speech is normal. No gait instability. Skin:  Skin is warm, dry and intact. No rash noted. Psychiatric: Mood and affect are normal. Speech and behavior are normal.  ____________________________________________   LABS (all  labs ordered are listed, but only abnormal results are displayed)  Labs Reviewed  CBC WITH DIFFERENTIAL/PLATELET - Abnormal; Notable for the following:    Lymphs Abs 0.2 (*)    All other components within normal limits  COMPREHENSIVE METABOLIC PANEL - Abnormal; Notable for the following:    Glucose, Bld 223 (*)    AST 60 (*)    ALT 187 (*)    All other components within normal limits  URINALYSIS COMPLETEWITH MICROSCOPIC ([facility] ONLY) - Abnormal; Notable for the following:    Color, Urine AMBER (*)    APPearance CLEAR (*)    Glucose, UA 50 (*)    Protein, ur 30 (*)    Squamous Epithelial / LPF 0-5 (*)    All other components within normal limits  LIPASE, BLOOD   ____________________________________________  EKG   ____________________________________________  RADIOLOGY   ____________________________________________   PROCEDURES  Procedure(s) performed: None  Critical Care performed: No  ____________________________________________   INITIAL IMPRESSION / ASSESSMENT AND PLAN / ED COURSE  Pertinent labs & imaging results that were available during  my care of the patient were reviewed by me and considered in my medical decision making (see chart for details).  Vomiting and abdominal pain and distention post surgery. Patient appears to have no acute surgical complications and is afebrile with a normal white count. She has no systemic symptoms to suggest infection. No cardiopulmonary symptoms. Exam and history seem to be most consistent with abdominal pain likely secondary to either severe constipation, ileus, or alternatively bowel obstruction.  Based on the patient's clinical history I suspect this is likely ileus or constipation secondary to narcotics, but we will obtain CT abdomen and pelvis to rule out obstruction.  ----------------------------------------- 5:57 AM on 02/21/2015 -----------------------------------------  Patient had a small bowel movement post  enema, CT and history seem most compatible with ileus. Given her vomiting and the fact that this appears to be a small bowel ileus I discussed with the patient and we will admit the patient for bowel rest and consult by general surgery. I discussed with Dr. Leanora Cover, and I do not anticipate any need for surgery given this is likely ileus, but have requested there consultation. ____________________________________________   FINAL CLINICAL IMPRESSION(S) / ED DIAGNOSES  Final diagnoses:  Ileus, postoperative      Delman Kitten, MD 02/21/15 [phone]

## 2015-02-21 NOTE — H&P (Signed)
[place] [facility] [place] NAME: Sandra Hendrix    MR#:  [id number]  DATE OF BIRTH:  October 02, 1945  DATE OF ADMISSION:  02/21/2015  PRIMARY CARE PHYSICIAN: Crecencio Mc, MD   REQUESTING/REFERRING PHYSICIAN: Dr. Jacqualine Code  CHIEF COMPLAINT:   Chief Complaint  Patient presents with  . Post-op Problem   nausea, vomiting, constipation.  HISTORY OF PRESENT ILLNESS:  Sandra Hendrix  is a 69 y.o. female with below mentioned past medical history, status post right rotator cuff repair 3 days ago presents to the emergency room with the complaints of ongoing nausea vomiting with constipation and crampy abdominal pain for the past 2-3 days. Patient's states that following her shoulder surgery she has been taking pain medications and she developed crampy abdominal pain with inability to pass stool or gas, developed nausea and vomiting, hence came to the emergency room for further evaluation. Denies any fever, chills, shortness of breath, chest pain, dysuria. Patient was evaluated by the ED physician and found to be with stable vitals, afebrile. Workup revealed normal CBC, normal electrolytes, mildly elevated LFTs. CT of the abdomen and pelvis shows dilated small bowel loops indicate a of reactive ileus. Surgery on call was consulted by the ED physician who recommended admission to medical unit and conservative management at this time and will consult in the morning. He received medication for nausea and pain medications following which her symptoms are slightly better. She is kept nothing by mouth and receiving IV fluids.  PAST MEDICAL HISTORY:   Past Medical History  Diagnosis Date  . Diabetes mellitus without complication   . Hyperlipidemia   . Hypertension   . Arthritis   . Depression   . GERD (gastroesophageal reflux disease)   . Allergy   . Urinary incontinence   . Varicose veins     PAST SURGICAL HISTORY:   Past Surgical History  Procedure  Laterality Date  . Joint replacement Right 2007  . Joint replacement Left 2004  . Rotator cuff repair Right     SOCIAL HISTORY:   History  Substance Use Topics  . Smoking status: Never Smoker   . Smokeless tobacco: Not on file  . Alcohol Use: No    FAMILY HISTORY:   Family History  Problem Relation Age of Onset  . Stroke Mother   . Hypertension Mother   . Lupus Mother   . Alcohol abuse Father   . Diabetes Father   . Arthritis Sister   . Diabetes Sister   . Hyperlipidemia Daughter   . Hypertension Daughter   . Mental retardation Daughter   . Cancer Maternal Aunt     ovarian ca  . Diabetes Paternal Aunt   . Drug abuse Paternal Aunt   . Cancer Maternal Grandfather     colon ca    DRUG ALLERGIES:   Allergies  Allergen Reactions  . Clindamycin/Lincomycin Rash  . Codeine Rash  . Morphine And Related Nausea Only  . Talwin [Pentazocine] Nausea And Vomiting    REVIEW OF SYSTEMS:   Review of Systems  Constitutional: Negative for fever, chills and malaise/fatigue.  HENT: Negative for ear pain, hearing loss, nosebleeds, sore throat and tinnitus.   Eyes: Negative for blurred vision, double vision, pain, discharge and redness.  Respiratory: Negative for cough, hemoptysis, sputum production, shortness of breath and wheezing.   Cardiovascular: Negative for chest pain, palpitations, orthopnea and leg swelling.  Gastrointestinal: Positive for nausea, vomiting, abdominal pain and constipation. Negative for diarrhea,  blood in stool and melena.  Genitourinary: Negative for dysuria, urgency, frequency and hematuria.  Musculoskeletal: Negative for back pain, joint pain and neck pain.  Skin: Negative for itching and rash.  Neurological: Negative for dizziness, tingling, sensory change, focal weakness and seizures.  Endo/Heme/Allergies: Does not bruise/bleed easily.  Psychiatric/Behavioral: Negative for depression. The patient is not nervous/anxious.     MEDICATIONS AT HOME:    Prior to Admission medications   Medication Sig Start Date End Date Taking? Authorizing Provider  albuterol (PROVENTIL HFA;VENTOLIN HFA) 108 (90 BASE) MCG/ACT inhaler Inhale 2 puffs into the lungs every 6 (six) hours as needed for wheezing.    Historical Provider, MD  Ascorbic Acid 500 MG CHEW Chew by mouth.    Historical Provider, MD  aspirin 81 MG tablet Take 81 mg by mouth daily.    Historical Provider, MD  atorvastatin (LIPITOR) 40 MG tablet Take 0.5 tablets (20 mg total) by mouth daily. 08/10/14   Crecencio Mc, MD  azelastine (ASTEPRO) 137 MCG/SPRAY nasal spray Place 1 spray into the nose 2 (two) times daily. Use in each nostril as directed 11/12/12   Crecencio Mc, MD  azithromycin (ZITHROMAX) 250 MG tablet Take 2 tablets by mouth on day 1, take 1 tablet by mouth each day after for 4 days. 02/10/15   Rubbie Battiest, NP  BIOTIN PO Take by mouth. Take 1000mg  by mouth twice daily.    Historical Provider, MD  Black Cohosh (REMIFEMIN) 20 MG TABS Take 2 tablets by mouth daily.    Historical Provider, MD  Calcium Carbonate-Vitamin D (CALCIUM 600 + D PO) Take 1 tablet by mouth daily.    Historical Provider, MD  Cholecalciferol (D3-1000 PO) Take 2 tablets in the am and 1 tablet in the pm.    Historical Provider, MD  fish oil-omega-3 fatty acids 1000 MG capsule Take 1 capsule by mouth daily.    Historical Provider, MD  fluticasone (FLONASE) 50 MCG/ACT nasal spray Place 1 spray into both nostrils daily. 12/24/13   Crecencio Mc, MD  GLIPIZIDE XL 10 MG 24 hr tablet TAKE 1 TABLET EVERY DAY 09/05/14   Crecencio Mc, MD  Lancet Devices [facility]) lancets Use to test blood sugars 1 -2 times daily 06/24/14   Crecencio Mc, MD  lisinopril (PRINIVIL,ZESTRIL) 20 MG tablet TAKE 1 TABLET EVERY DAY 01/15/15   Crecencio Mc, MD  Meclizine HCl 25 MG CHEW Chew by mouth.    Historical Provider, MD  metformin (FORTAMET) 500 MG (OSM) 24 hr tablet TAKE 2 TABLETS TWICE DAILY WITH MEALS 09/05/14   Crecencio Mc, MD  Multiple Vitamin (MULTIVITAMIN) tablet Take 1 tablet by mouth daily.    Historical Provider, MD  Omega-3 Fatty Acids (FISH OIL) 1000 MG CAPS Take by mouth.    Historical Provider, MD  omeprazole (PRILOSEC) 20 MG capsule TAKE 1 CAPSULE EVERY DAY 09/05/14   Crecencio Mc, MD  Potassium 99 MG TABS Take 1 tablet by mouth daily.    Historical Provider, MD  sertraline (ZOLOFT) 50 MG tablet TAKE 1 TABLET EVERY DAY 01/15/15   Crecencio Mc, MD  sitaGLIPtin (JANUVIA) 25 MG tablet Take 1 tablet (25 mg total) by mouth daily. 08/10/14   Crecencio Mc, MD  verapamil (VERELAN PM) 180 MG 24 hr capsule TAKE 1 CAPSULE EVERY DAY 09/05/14   Crecencio Mc, MD  vitamin C (ASCORBIC ACID) 500 MG tablet Take 500 mg by mouth daily.    Historical  Provider, MD  Vitamin D, Ergocalciferol, (DRISDOL) 50000 UNITS CAPS capsule Take by mouth.    Historical Provider, MD      VITAL SIGNS:  Blood pressure 112/68, pulse 80, temperature 98.3 F (36.8 C), temperature source Oral, resp. rate 14, height 5\' 1"  (1.549 m), weight 81.647 kg (180 lb), SpO2 93 %.  PHYSICAL EXAMINATION:  Physical Exam  Constitutional: She is oriented to person, place, and time. She appears well-developed and well-nourished. No distress.  HENT:  Head: Normocephalic and atraumatic.  Right Ear: External ear normal.  Left Ear: External ear normal.  Nose: Nose normal.  Mouth/Throat: Oropharynx is clear and moist. No oropharyngeal exudate.  Eyes: EOM are normal. Pupils are equal, round, and reactive to light. No scleral icterus.  Neck: Normal range of motion. Neck supple. No JVD present. No thyromegaly present.  Cardiovascular: Normal rate, regular rhythm, normal heart sounds and intact distal pulses.  Exam reveals no friction rub.   No murmur heard. Respiratory: Effort normal and breath sounds normal. No respiratory distress. She has no wheezes. She has no rales. She exhibits no tenderness.  GI: Soft. She exhibits distension. She exhibits  no mass. There is tenderness. There is no rebound and no guarding.  Hyperactive bowel sounds  Musculoskeletal: Normal range of motion. She exhibits no edema.  Lymphadenopathy:    She has no cervical adenopathy.  Neurological: She is alert and oriented to person, place, and time. She has normal reflexes. She displays normal reflexes. No cranial nerve deficit. She exhibits normal muscle tone.  Skin: Skin is warm. No rash noted. No erythema.  Psychiatric: She has a normal mood and affect. Her behavior is normal. Thought content normal.   LABORATORY PANEL:   CBC  Recent Labs Lab 02/20/15 2137  WBC 6.6  HGB 12.9  HCT 39.7  PLT 280   ------------------------------------------------------------------------------------------------------------------  Chemistries   Recent Labs Lab 02/20/15 2137  NA 135  K 4.4  CL 102  CO2 23  GLUCOSE 223*  BUN 19  CREATININE 0.91  CALCIUM 9.4  AST 60*  ALT 187*  ALKPHOS 114  BILITOT 0.5   ------------------------------------------------------------------------------------------------------------------  Cardiac Enzymes No results for input(s): TROPONINI in the last 168 hours. ------------------------------------------------------------------------------------------------------------------  RADIOLOGY:  Ct Abdomen Pelvis W Contrast  02/21/2015   CLINICAL DATA:  Rotator cuff surgery on Tuesday, now feeling constipated with vomiting.  EXAM: CT ABDOMEN AND PELVIS WITH CONTRAST  TECHNIQUE: Multidetector CT imaging of the abdomen and pelvis was performed using the standard protocol following bolus administration of intravenous contrast.  CONTRAST:  165mL OMNIPAQUE IOHEXOL 300 MG/ML  SOLN  COMPARISON:  None.  FINDINGS: BODY WALL: No contributory findings.  LOWER CHEST: Bibasilar atelectasis.  Small hiatal hernia with large gastroesophageal reflux.  ABDOMEN/PELVIS:  Liver: No focal abnormality.  Biliary: Cholecystectomy.  No bile duct enlargement   Pancreas: Unremarkable.  Spleen: Unremarkable.  Adrenals: Unremarkable.  Kidneys and ureters: No hydronephrosis or stone.  Bladder: Unremarkable.  Reproductive: 3.4 cm simple appearing cyst in the left ovary.  Bowel: There is dilated fluid-filled proximal small bowel which gradually decreases in caliber without a discrete transition point. The colon is not decompressed, but rather diffusely contains stool. No indication of bowel necrosis or perforation. There is only mild interloop fluid in the central mesenteries. Normal appendix.  Retroperitoneum: No mass or adenopathy.  Peritoneum: No ascites or pneumoperitoneum.  Vascular: No acute abnormality.  OSSEOUS: No acute abnormalities.  IMPRESSION: 1. Dilated small bowel. Lack of transition point favors reactive ileus over partial  obstruction. 2. 34 mm left ovarian cyst. Recommend 1 year followup pelvic ultrasound.   Electronically Signed   By: Monte Fantasia M.D.   On: 02/21/2015 04:18    EKG:  No orders found for this or any previous visit.  IMPRESSION AND PLAN:   1. Nausea vomiting with constipation/obstipation following taking pain medications following her recent shoulder surgery- Ileus likely secondary to pain medications. Plan: Admit to MedSurg, nothing by mouth, IV fluids, IV antinausea medications, IV pain medications. Surgical consultation requested for further advice. 2. Diabetes mellitus type 2 on oral medications. Stable clinically. We will hold metformin and glipizide for now since patient underwent a CT with contrast and patient is on nothing by mouth. Place on sliding scale insulin and follow blood sugars. 3. Hypertension stable on home medications, continue same. 4. Hyperlipidemia stable on home medications, continue same. 5. Status post right rotator cuff repair surgery done 3 days ago, stable. Continue sling.    All the records are reviewed and case discussed with ED provider. Management plans discussed with the patient, family and  they are in agreement.  CODE STATUS: Full code  TOTAL TIME TAKING CARE OF THIS PATIENT: 50 minutes.    Juluis Mire M.D on 02/21/2015 at 6:59 AM  Between 7am to 6pm - Pager - [phone]  After 6pm go to [email or web address] - password [email or web address] [facility]  Office  [phone]  CC: Primary care physician; Crecencio Mc, MD

## 2015-02-21 NOTE — Progress Notes (Signed)
[place] at [facility]                                                                                                                                                                                            Patient Demographics   Sandra Hendrix, is a 69 y.o. female, DOB - 1946-02-20, HBZ:[id number]  Admit date - 02/21/2015   Admitting Physician Dustin Flock, MD  Outpatient Primary MD for the patient is Crecencio Mc, MD         Subjective:  Patient admitted with abdominal distention nausea vomiting, she has recently been on pain medications due to surgery of her right rotator cuff.  She received enema L has had global bowel movement. And feels much better.   Review of Systems:   CONSTITUTIONAL: No documented fever. No fatigue, weakness. No weight gain, no weight loss.  EYES: No blurry or double vision.  ENT: No tinnitus. No postnasal drip. No redness of the oropharynx.  RESPIRATORY: No cough, no wheeze, no hemoptysis. No dyspnea.  CARDIOVASCULAR: No chest pain. No orthopnea. No palpitations. No syncope.  GASTROINTESTINAL: No nausea, no vomiting or diarrhea. No abdominal pain. No melena or hematochezia.  GENITOURINARY: No dysuria or hematuria.  ENDOCRINE: No polyuria or nocturia. No heat or cold intolerance.  HEMATOLOGY: No anemia. No bruising. No bleeding.  INTEGUMENTARY: No rashes. No lesions.  MUSCULOSKELETAL: No arthritis. No swelling. No gout.  NEUROLOGIC: No numbness, tingling, or ataxia. No seizure-type activity.  PSYCHIATRIC: No anxiety. No insomnia. No ADD.    Vitals:   Filed Vitals:   02/20/15 2113 02/21/15 0149 02/21/15 0825 02/21/15 1020  BP: 137/79 112/68 147/62 140/58  Pulse: 95 80 76 67  Temp: 97.6 F (36.4 C) 98.3 F (36.8 C)  98.1 F (36.7 C)  TempSrc: Oral Oral  Oral  Resp: 18 14    Height: 5\' 1"  (1.549 m) 5\' 1"  (1.549 m)    Weight: 81.647 kg (180 lb) 81.647 kg (180 lb)    SpO2: 96% 93% 96% 95%    Wt  Readings from Last 3 Encounters:  02/21/15 81.647 kg (180 lb)  02/10/15 82.736 kg (182 lb 6.4 oz)  11/07/14 78.699 kg (173 lb 8 oz)    No intake or output data in the 24 hours ending 02/21/15 1202  Physical Exam:   GENERAL: Pleasant-appearing in no apparent distress.  HEAD, EYES, EARS, NOSE AND THROAT: Atraumatic, normocephalic. Extraocular muscles are intact. Pupils equal and reactive to light. Sclerae anicteric. No conjunctival injection. No oro-pharyngeal erythema.  NECK: Supple. There is no jugular venous distention. No bruits, no lymphadenopathy, no thyromegaly.  HEART: Regular rate and rhythm, tachycardic. No murmurs, no rubs, no clicks.  LUNGS: Clear to auscultation bilaterally. No rales or rhonchi. No wheezes.  ABDOMEN: Soft, flat, nontender, nondistended. Has good bowel sounds. No hepatosplenomegaly appreciated.  EXTREMITIES: No evidence of any cyanosis, clubbing, or peripheral edema.  +2 pedal and radial pulses bilaterally.  NEUROLOGIC: The patient is alert, awake, and oriented x3 with no focal motor or sensory deficits appreciated bilaterally.  SKIN: Moist and warm with no rashes appreciated.  Psych: Not anxious, depressed LN: No inguinal LN enlargement    Antibiotics   Anti-infectives    None      Medications   Scheduled Meds: . aspirin EC  81 mg Oral Daily  . atorvastatin  20 mg Oral Daily  . azelastine  1 spray Each Nare BID  . enoxaparin (LOVENOX) injection  40 mg Subcutaneous Q24H  . fluticasone  1 spray Each Nare Daily  . insulin aspart  0-9 Units Subcutaneous 6 times per day  . lisinopril  20 mg Oral Daily  . pantoprazole  40 mg Oral Daily  . sertraline  50 mg Oral Daily  . verapamil  180 mg Oral Daily   Continuous Infusions: . sodium chloride     PRN Meds:.acetaminophen **OR** acetaminophen, albuterol, ondansetron **OR** ondansetron (ZOFRAN) IV   Data Review:   Micro Results No results found for this or any previous visit (from the past 240  hour(s)).  Radiology Reports Ct Abdomen Pelvis W Contrast  02/21/2015   CLINICAL DATA:  Rotator cuff surgery on Tuesday, now feeling constipated with vomiting.  EXAM: CT ABDOMEN AND PELVIS WITH CONTRAST  TECHNIQUE: Multidetector CT imaging of the abdomen and pelvis was performed using the standard protocol following bolus administration of intravenous contrast.  CONTRAST:  142mL OMNIPAQUE IOHEXOL 300 MG/ML  SOLN  COMPARISON:  None.  FINDINGS: BODY WALL: No contributory findings.  LOWER CHEST: Bibasilar atelectasis.  Small hiatal hernia with large gastroesophageal reflux.  ABDOMEN/PELVIS:  Liver: No focal abnormality.  Biliary: Cholecystectomy.  No bile duct enlargement  Pancreas: Unremarkable.  Spleen: Unremarkable.  Adrenals: Unremarkable.  Kidneys and ureters: No hydronephrosis or stone.  Bladder: Unremarkable.  Reproductive: 3.4 cm simple appearing cyst in the left ovary.  Bowel: There is dilated fluid-filled proximal small bowel which gradually decreases in caliber without a discrete transition point. The colon is not decompressed, but rather diffusely contains stool. No indication of bowel necrosis or perforation. There is only mild interloop fluid in the central mesenteries. Normal appendix.  Retroperitoneum: No mass or adenopathy.  Peritoneum: No ascites or pneumoperitoneum.  Vascular: No acute abnormality.  OSSEOUS: No acute abnormalities.  IMPRESSION: 1. Dilated small bowel. Lack of transition point favors reactive ileus over partial obstruction. 2. 34 mm left ovarian cyst. Recommend 1 year followup pelvic ultrasound.   Electronically Signed   By: Monte Fantasia M.D.   On: 02/21/2015 04:18     CBC  Recent Labs Lab 02/20/15 2137  WBC 6.6  HGB 12.9  HCT 39.7  PLT 280  MCV 89.9  MCH 29.3  MCHC 32.6  RDW 14.5  LYMPHSABS 0.2*  MONOABS 0.4  EOSABS 0.0  BASOSABS 0.0    Chemistries   Recent Labs Lab 02/16/15 1053 02/20/15 2137  NA 136  136 135  K 4.2  4.2 4.4  CL 105  105 102   CO2 25  25 23   GLUCOSE 119*  119* 223*  BUN 21  21 19   CREATININE 1.07  1.07 0.91  CALCIUM  9.6  9.6 9.4  AST 22 60*  ALT 18 187*  ALKPHOS 111 114  BILITOT 0.4 0.5   ------------------------------------------------------------------------------------------------------------------ estimated creatinine clearance is 55.7 mL/min (by C-G formula based on Cr of 0.91). ------------------------------------------------------------------------------------------------------------------ No results for input(s): HGBA1C in the last 72 hours. ------------------------------------------------------------------------------------------------------------------ No results for input(s): CHOL, HDL, LDLCALC, TRIG, CHOLHDL, LDLDIRECT in the last 72 hours. ------------------------------------------------------------------------------------------------------------------ No results for input(s): TSH, T4TOTAL, T3FREE, THYROIDAB in the last 72 hours.  Invalid input(s): FREET3 ------------------------------------------------------------------------------------------------------------------ No results for input(s): VITAMINB12, FOLATE, FERRITIN, TIBC, IRON, RETICCTPCT in the last 72 hours.  Coagulation profile No results for input(s): INR, PROTIME in the last 168 hours.  No results for input(s): DDIMER in the last 72 hours.  Cardiac Enzymes No results for input(s): CKMB, TROPONINI, MYOGLOBIN in the last 168 hours.  Invalid input(s): CK ------------------------------------------------------------------------------------------------------------------ Invalid input(s): POCBNP    Assessment & Plan   Principal Problem:   Ileus, likely due to recent surgery as well as pain medications; patient had good 3 bowel movements and now abdominal pain and distention and pain are mostly resolved., I will go ahead and advance her diet. Active Problems:   DM (diabetes mellitus), type 2, uncontrolled, with renal  complications:ssi, resume her home medications.   Essential hypertension: Continue on lisinopril   Depression:  continue Zoloft      Code Status Orders        Start     Ordered   02/21/15 1020  Full code   Continuous     02/21/15 1019      DVT Prophylaxis  Lovenox   Lab Results  Component Value Date   PLT 280 02/20/2015     Time Spent in minutes  45 minutes   Dustin Flock M.D on 02/21/2015 at 12:02 PM  Between 7am to 6pm - Pager - [phone]  After 6pm go to [email or web address] - password [email or web address] [facility]   Office  [phone]

## 2015-02-21 NOTE — ED Notes (Signed)
Admitting MD into evaluate pt for admission

## 2015-02-21 NOTE — Progress Notes (Signed)
Pt admitted to [facility] for treatment of N/V and constipation after status post surgery to her rotator cuff on Tuesday at [facility].  Enema given in ED and effective, C/O nausea X1, PRN Zofran effective, tolerating a carb controlled diet, up in room independently, voiding and having BM's with no observed complications.

## 2015-02-22 LAB — CBC
HCT: 36 % (ref 35.0–47.0)
Hemoglobin: 12 g/dL (ref 12.0–16.0)
MCH: 29.8 pg (ref 26.0–34.0)
MCHC: 33.4 g/dL (ref 32.0–36.0)
MCV: 89.2 fL (ref 80.0–100.0)
Platelets: 291 10*3/uL (ref 150–440)
RBC: 4.04 MIL/uL (ref 3.80–5.20)
RDW: 14.1 % (ref 11.5–14.5)
WBC: 5 10*3/uL (ref 3.6–11.0)

## 2015-02-22 LAB — GLUCOSE, CAPILLARY
Glucose-Capillary: 103 mg/dL — ABNORMAL HIGH (ref 65–99)
Glucose-Capillary: 131 mg/dL — ABNORMAL HIGH (ref 65–99)
Glucose-Capillary: 146 mg/dL — ABNORMAL HIGH (ref 65–99)
Glucose-Capillary: 160 mg/dL — ABNORMAL HIGH (ref 65–99)
Glucose-Capillary: 177 mg/dL — ABNORMAL HIGH (ref 65–99)
Glucose-Capillary: 73 mg/dL (ref 65–99)

## 2015-02-22 LAB — BASIC METABOLIC PANEL
Anion gap: 7 (ref 5–15)
BUN: 15 mg/dL (ref 6–20)
CO2: 25 mmol/L (ref 22–32)
Calcium: 8.3 mg/dL — ABNORMAL LOW (ref 8.9–10.3)
Chloride: 108 mmol/L (ref 101–111)
Creatinine, Ser: 0.88 mg/dL (ref 0.44–1.00)
GFR calc Af Amer: >60 mL/min (ref >60)
GFR calc non Af Amer: >60 mL/min (ref >60)
Glucose, Bld: 91 mg/dL (ref 65–99)
Potassium: 3.8 mmol/L (ref 3.5–5.1)
Sodium: 140 mmol/L (ref 135–145)

## 2015-02-22 MED ORDER — PANTOPRAZOLE SODIUM 40 MG IV SOLR
40.0000 mg | Freq: Two times a day (BID) | INTRAVENOUS | Status: DC
  Administered 2015-02-22 – 2015-02-23 (×3): 40 mg via INTRAVENOUS
  Filled 2015-02-22 (×3): qty 40

## 2015-02-22 NOTE — Progress Notes (Signed)
[place] at [facility]                                                                                                                                                                                            Patient Demographics   Sandra Hendrix, is a 69 y.o. female, DOB - 02/19/1946, UVO:[id number]  Admit date - 02/21/2015   Admitting Physician Dustin Flock, MD  Outpatient Primary MD for the patient is Crecencio Mc, MD         Patient states that overnight was very nauseous. Had some diarrhea.    Review of Systems:   CONSTITUTIONAL: No documented fever. No fatigue, weakness. No weight gain, no weight loss.  EYES: No blurry or double vision.  ENT: No tinnitus. No postnasal drip. No redness of the oropharynx.  RESPIRATORY: No cough, no wheeze, no hemoptysis. No dyspnea.  CARDIOVASCULAR: No chest pain. No orthopnea. No palpitations. No syncope.  GASTROINTESTINAL: Positive nausea, no vomiting or positive diarrhea. No abdominal pain. No melena or hematochezia.  GENITOURINARY: No dysuria or hematuria.  ENDOCRINE: No polyuria or nocturia. No heat or cold intolerance.  HEMATOLOGY: No anemia. No bruising. No bleeding.  INTEGUMENTARY: No rashes. No lesions.  MUSCULOSKELETAL: No arthritis. No swelling. No gout.  NEUROLOGIC: No numbness, tingling, or ataxia. No seizure-type activity.  PSYCHIATRIC: No anxiety. No insomnia. No ADD.    Vitals:   Filed Vitals:   02/21/15 1633 02/22/15 0004 02/22/15 0447 02/22/15 0726  BP: 143/64 131/51  139/68  Pulse: 67 70  66  Temp: 98.4 F (36.9 C) 98.4 F (36.9 C)  98.4 F (36.9 C)  TempSrc: Oral Oral  Oral  Resp:  20  18  Height:      Weight:   84.868 kg (187 lb 1.6 oz)   SpO2: 94% 93%  92%    Wt Readings from Last 3 Encounters:  02/22/15 84.868 kg (187 lb 1.6 oz)  02/10/15 82.736 kg (182 lb 6.4 oz)  11/07/14 78.699 kg (173 lb 8 oz)     Intake/Output Summary (Last 24 hours) at 02/22/15  1115 Last data filed at 02/22/15 0900  Gross per 24 hour  Intake 2969.92 ml  Output   1100 ml  Net 1869.92 ml    Physical Exam:   GENERAL: Pleasant-appearing in no apparent distress.  HEAD, EYES, EARS, NOSE AND THROAT: Atraumatic, normocephalic. Extraocular muscles are intact. Pupils equal and reactive to light. Sclerae anicteric. No conjunctival injection. No oro-pharyngeal erythema.  NECK: Supple. There is no jugular venous distention. No bruits, no lymphadenopathy, no thyromegaly.  HEART: Regular rate and rhythm, tachycardic.  No murmurs, no rubs, no clicks.  LUNGS: Clear to auscultation bilaterally. No rales or rhonchi. No wheezes.  ABDOMEN: Soft, flat, nontender, nondistended. Has good bowel sounds. No hepatosplenomegaly appreciated.  EXTREMITIES: No evidence of any cyanosis, clubbing, or peripheral edema.  +2 pedal and radial pulses bilaterally.  NEUROLOGIC: The patient is alert, awake, and oriented x3 with no focal motor or sensory deficits appreciated bilaterally.  SKIN: Moist and warm with no rashes appreciated.  Psych: Not anxious, depressed LN: No inguinal LN enlargement    Antibiotics   Anti-infectives    None      Medications   Scheduled Meds: . aspirin EC  81 mg Oral Daily  . atorvastatin  20 mg Oral Daily  . azelastine  1 spray Each Nare BID  . fluticasone  1 spray Each Nare Daily  . insulin aspart  0-9 Units Subcutaneous 6 times per day  . lisinopril  20 mg Oral Daily  . omega-3 acid ethyl esters  1 g Oral Daily  . pantoprazole (PROTONIX) IV  40 mg Intravenous Q12H  . sertraline  50 mg Oral Daily  . verapamil  180 mg Oral Daily   Continuous Infusions:   PRN Meds:.acetaminophen **OR** acetaminophen, albuterol, ondansetron **OR** ondansetron (ZOFRAN) IV   Data Review:   Micro Results No results found for this or any previous visit (from the past 240 hour(s)).  Radiology Reports Ct Abdomen Pelvis W Contrast  02/21/2015   CLINICAL DATA:  Rotator cuff  surgery on Tuesday, now feeling constipated with vomiting.  EXAM: CT ABDOMEN AND PELVIS WITH CONTRAST  TECHNIQUE: Multidetector CT imaging of the abdomen and pelvis was performed using the standard protocol following bolus administration of intravenous contrast.  CONTRAST:  14mL OMNIPAQUE IOHEXOL 300 MG/ML  SOLN  COMPARISON:  None.  FINDINGS: BODY WALL: No contributory findings.  LOWER CHEST: Bibasilar atelectasis.  Small hiatal hernia with large gastroesophageal reflux.  ABDOMEN/PELVIS:  Liver: No focal abnormality.  Biliary: Cholecystectomy.  No bile duct enlargement  Pancreas: Unremarkable.  Spleen: Unremarkable.  Adrenals: Unremarkable.  Kidneys and ureters: No hydronephrosis or stone.  Bladder: Unremarkable.  Reproductive: 3.4 cm simple appearing cyst in the left ovary.  Bowel: There is dilated fluid-filled proximal small bowel which gradually decreases in caliber without a discrete transition point. The colon is not decompressed, but rather diffusely contains stool. No indication of bowel necrosis or perforation. There is only mild interloop fluid in the central mesenteries. Normal appendix.  Retroperitoneum: No mass or adenopathy.  Peritoneum: No ascites or pneumoperitoneum.  Vascular: No acute abnormality.  OSSEOUS: No acute abnormalities.  IMPRESSION: 1. Dilated small bowel. Lack of transition point favors reactive ileus over partial obstruction. 2. 34 mm left ovarian cyst. Recommend 1 year followup pelvic ultrasound.   Electronically Signed   By: Monte Fantasia M.D.   On: 02/21/2015 04:18     CBC  Recent Labs Lab 02/20/15 2137 02/22/15 0553  WBC 6.6 5.0  HGB 12.9 12.0  HCT 39.7 36.0  PLT 280 291  MCV 89.9 89.2  MCH 29.3 29.8  MCHC 32.6 33.4  RDW 14.5 14.1  LYMPHSABS 0.2*  --   MONOABS 0.4  --   EOSABS 0.0  --   BASOSABS 0.0  --     Chemistries   Recent Labs Lab 02/16/15 1053 02/20/15 2137 02/22/15 0553  NA 136  136 135 140  K 4.2  4.2 4.4 3.8  CL 105  105 102 108  CO2  25  25 23  25  GLUCOSE 119*  119* 223* 91  BUN 21  21 19 15   CREATININE 1.07  1.07 0.91 0.88  CALCIUM 9.6  9.6 9.4 8.3*  AST 22 60*  --   ALT 18 187*  --   ALKPHOS 111 114  --   BILITOT 0.4 0.5  --    ------------------------------------------------------------------------------------------------------------------ estimated creatinine clearance is 58.8 mL/min (by C-G formula based on Cr of 0.88). ------------------------------------------------------------------------------------------------------------------ No results for input(s): HGBA1C in the last 72 hours. ------------------------------------------------------------------------------------------------------------------ No results for input(s): CHOL, HDL, LDLCALC, TRIG, CHOLHDL, LDLDIRECT in the last 72 hours. ------------------------------------------------------------------------------------------------------------------ No results for input(s): TSH, T4TOTAL, T3FREE, THYROIDAB in the last 72 hours.  Invalid input(s): FREET3 ------------------------------------------------------------------------------------------------------------------ No results for input(s): VITAMINB12, FOLATE, FERRITIN, TIBC, IRON, RETICCTPCT in the last 72 hours.  Coagulation profile No results for input(s): INR, PROTIME in the last 168 hours.  No results for input(s): DDIMER in the last 72 hours.  Cardiac Enzymes No results for input(s): CKMB, TROPONINI, MYOGLOBIN in the last 168 hours.  Invalid input(s): CK ------------------------------------------------------------------------------------------------------------------ Invalid input(s): POCBNP    Assessment & Plan   Principal Problem:   Ileus, likely due to recent surgery as well as pain medications; continue supportive care, ambulate. Repeat abdominal x-ray in the morning Active Problems:   DM (diabetes mellitus), type 2, uncontrolled, with renal complications:ssi, hold by mouth meds due  to hypoglycemia   Essential hypertension: Continue on lisinopril   Depression:  continue Zoloft      Code Status Orders        Start     Ordered   02/21/15 1020  Full code   Continuous     02/21/15 1019      DVT Prophylaxis  Lovenox   Lab Results  Component Value Date   PLT 291 02/22/2015     Time Spent in minutes  35 minutes   Dustin Flock M.D on 02/22/2015 at 11:15 AM  Between 7am to 6pm - Pager - [phone]  After 6pm go to [email or web address] - password [email or web address] [facility]   Office  [phone]

## 2015-02-22 NOTE — Care Management (Signed)
Recent shoulder surgery in [place]. She states she did go home but did not need home health services. Her Daughter states she is independent with ambulation. Her PCP is  Dr Derrel Nip.

## 2015-02-23 ENCOUNTER — Inpatient Hospital Stay: Payer: Medicare PPO

## 2015-02-23 LAB — BASIC METABOLIC PANEL
Anion gap: 8 (ref 5–15)
BUN: 9 mg/dL (ref 6–20)
CO2: 25 mmol/L (ref 22–32)
Calcium: 8.6 mg/dL — ABNORMAL LOW (ref 8.9–10.3)
Chloride: 110 mmol/L (ref 101–111)
Creatinine, Ser: 0.91 mg/dL (ref 0.44–1.00)
GFR calc Af Amer: >60 mL/min (ref >60)
GFR calc non Af Amer: >60 mL/min (ref >60)
Glucose, Bld: 79 mg/dL (ref 65–99)
Potassium: 3.4 mmol/L — ABNORMAL LOW (ref 3.5–5.1)
Sodium: 143 mmol/L (ref 135–145)

## 2015-02-23 LAB — GLUCOSE, CAPILLARY
Glucose-Capillary: 137 mg/dL — ABNORMAL HIGH (ref 65–99)
Glucose-Capillary: 148 mg/dL — ABNORMAL HIGH (ref 65–99)
Glucose-Capillary: 178 mg/dL — ABNORMAL HIGH (ref 65–99)
Glucose-Capillary: 66 mg/dL (ref 65–99)
Glucose-Capillary: 83 mg/dL (ref 65–99)
Glucose-Capillary: 87 mg/dL (ref 65–99)

## 2015-02-23 MED ORDER — PANTOPRAZOLE SODIUM 40 MG PO TBEC
40.0000 mg | DELAYED_RELEASE_TABLET | Freq: Two times a day (BID) | ORAL | Status: DC
  Administered 2015-02-23 – 2015-02-24 (×2): 40 mg via ORAL
  Filled 2015-02-23 (×2): qty 1

## 2015-02-23 MED ORDER — SIMETHICONE 80 MG PO CHEW
80.0000 mg | CHEWABLE_TABLET | Freq: Four times a day (QID) | ORAL | Status: DC
  Administered 2015-02-23 – 2015-02-24 (×6): 80 mg via ORAL
  Filled 2015-02-23 (×6): qty 1

## 2015-02-23 MED ORDER — POTASSIUM CHLORIDE CRYS ER 20 MEQ PO TBCR
20.0000 meq | EXTENDED_RELEASE_TABLET | Freq: Once | ORAL | Status: AC
  Administered 2015-02-23: 20 meq via ORAL
  Filled 2015-02-23: qty 1

## 2015-02-23 NOTE — Progress Notes (Signed)
[place] [facility]                                                                                                                                                                                            Patient Demographics   Sandra Hendrix, is a 69 y.o. female, DOB - 15-Aug-1946, ILN:[id number]  Admit date - 02/21/2015   Admitting Physician Dustin Flock, MD  Outpatient Primary MD for the patient is Crecencio Mc, MD         Subjective:  Patient states that feeling better not nauseous anymore. Is passing gas and having bowel movements. Her abdominal x-ray still shows ileus pattern.  Review of Systems:   CONSTITUTIONAL: No documented fever. No fatigue, weakness. No weight gain, no weight loss.  EYES: No blurry or double vision.  ENT: No tinnitus. No postnasal drip. No redness of the oropharynx.  RESPIRATORY: No cough, no wheeze, no hemoptysis. No dyspnea.  CARDIOVASCULAR: No chest pain. No orthopnea. No palpitations. No syncope.  GASTROINTESTINAL: No nausea, no vomiting or diarrhea. No abdominal pain. No melena or hematochezia.  GENITOURINARY: No dysuria or hematuria.  ENDOCRINE: No polyuria or nocturia. No heat or cold intolerance.  HEMATOLOGY: No anemia. No bruising. No bleeding.  INTEGUMENTARY: No rashes. No lesions.  MUSCULOSKELETAL: No arthritis. No swelling. No gout.  NEUROLOGIC: No numbness, tingling, or ataxia. No seizure-type activity.  PSYCHIATRIC: No anxiety. No insomnia. No ADD.    Vitals:   Filed Vitals:   02/22/15 1604 02/22/15 2358 02/23/15 0500 02/23/15 0731  BP: 141/67 128/57  132/54  Pulse: 65 61  57  Temp: 98.3 F (36.8 C) 98.3 F (36.8 C)  97.8 F (36.6 C)  TempSrc: Oral Oral  Oral  Resp: 18 20  17   Height:      Weight:   84.641 kg (186 lb 9.6 oz)   SpO2: 92% 93%  95%    Wt Readings from Last 3 Encounters:  02/23/15 84.641 kg (186 lb 9.6 oz)  02/10/15 82.736 kg (182 lb 6.4 oz)  11/07/14 78.699 kg  (173 lb 8 oz)     Intake/Output Summary (Last 24 hours) at 02/23/15 1011 Last data filed at 02/22/15 2358  Gross per 24 hour  Intake   2060 ml  Output      0 ml  Net   2060 ml    Physical Exam:   GENERAL: Pleasant-appearing in no apparent distress.  HEAD, EYES, EARS, NOSE AND THROAT: Atraumatic, normocephalic. Extraocular muscles are intact. Pupils equal and reactive to light. Sclerae anicteric. No conjunctival injection. No oro-pharyngeal erythema.  NECK: Supple. There  is no jugular venous distention. No bruits, no lymphadenopathy, no thyromegaly.  HEART: Regular rate and rhythm, tachycardic. No murmurs, no rubs, no clicks.  LUNGS: Clear to auscultation bilaterally. No rales or rhonchi. No wheezes.  ABDOMEN: Soft, flat, nontender, nondistended. Has good bowel sounds. No hepatosplenomegaly appreciated.  EXTREMITIES: No evidence of any cyanosis, clubbing, or peripheral edema.  +2 pedal and radial pulses bilaterally.  NEUROLOGIC: The patient is alert, awake, and oriented x3 with no focal motor or sensory deficits appreciated bilaterally.  SKIN: Moist and warm with no rashes appreciated.  Psych: Not anxious, depressed LN: No inguinal LN enlargement    Antibiotics   Anti-infectives    None      Medications   Scheduled Meds: . aspirin EC  81 mg Oral Daily  . atorvastatin  20 mg Oral Daily  . azelastine  1 spray Each Nare BID  . fluticasone  1 spray Each Nare Daily  . insulin aspart  0-9 Units Subcutaneous 6 times per day  . lisinopril  20 mg Oral Daily  . omega-3 acid ethyl esters  1 g Oral Daily  . pantoprazole  40 mg Oral BID  . sertraline  50 mg Oral Daily  . verapamil  180 mg Oral Daily   Continuous Infusions:   PRN Meds:.acetaminophen **OR** acetaminophen, albuterol, ondansetron **OR** ondansetron (ZOFRAN) IV   Data Review:   Micro Results No results found for this or any previous visit (from the past 240 hour(s)).  Radiology Reports Ct Abdomen Pelvis W  Contrast  02/21/2015   CLINICAL DATA:  Rotator cuff surgery on Tuesday, now feeling constipated with vomiting.  EXAM: CT ABDOMEN AND PELVIS WITH CONTRAST  TECHNIQUE: Multidetector CT imaging of the abdomen and pelvis was performed using the standard protocol following bolus administration of intravenous contrast.  CONTRAST:  177mL OMNIPAQUE IOHEXOL 300 MG/ML  SOLN  COMPARISON:  None.  FINDINGS: BODY WALL: No contributory findings.  LOWER CHEST: Bibasilar atelectasis.  Small hiatal hernia with large gastroesophageal reflux.  ABDOMEN/PELVIS:  Liver: No focal abnormality.  Biliary: Cholecystectomy.  No bile duct enlargement  Pancreas: Unremarkable.  Spleen: Unremarkable.  Adrenals: Unremarkable.  Kidneys and ureters: No hydronephrosis or stone.  Bladder: Unremarkable.  Reproductive: 3.4 cm simple appearing cyst in the left ovary.  Bowel: There is dilated fluid-filled proximal small bowel which gradually decreases in caliber without a discrete transition point. The colon is not decompressed, but rather diffusely contains stool. No indication of bowel necrosis or perforation. There is only mild interloop fluid in the central mesenteries. Normal appendix.  Retroperitoneum: No mass or adenopathy.  Peritoneum: No ascites or pneumoperitoneum.  Vascular: No acute abnormality.  OSSEOUS: No acute abnormalities.  IMPRESSION: 1. Dilated small bowel. Lack of transition point favors reactive ileus over partial obstruction. 2. 34 mm left ovarian cyst. Recommend 1 year followup pelvic ultrasound.   Electronically Signed   By: Monte Fantasia M.D.   On: 02/21/2015 04:18   Dg Abd 2 Views  02/23/2015   CLINICAL DATA:  Abdominal pain for 3 days  EXAM: ABDOMEN - 2 VIEW  COMPARISON:  02/21/2015  FINDINGS: There are multiple dilated loops of small bowel identified. The degree of bowel distention is not significantly improved from previous exam. The enteric contrast material has passed into the colon. There is gas at the level of the  rectum.  IMPRESSION: 1. No significant change in ileus pattern. 2. Interval transit of CT contrast material into the colon.   Electronically Signed   By: Lovena Le  Clovis Riley M.D.   On: 02/23/2015 08:35     CBC  Recent Labs Lab 02/20/15 2137 02/22/15 0553  WBC 6.6 5.0  HGB 12.9 12.0  HCT 39.7 36.0  PLT 280 291  MCV 89.9 89.2  MCH 29.3 29.8  MCHC 32.6 33.4  RDW 14.5 14.1  LYMPHSABS 0.2*  --   MONOABS 0.4  --   EOSABS 0.0  --   BASOSABS 0.0  --     Chemistries   Recent Labs Lab 02/16/15 1053 02/20/15 2137 02/22/15 0553 02/23/15 0459  NA 136  136 135 140 143  K 4.2  4.2 4.4 3.8 3.4*  CL 105  105 102 108 110  CO2 25  25 23 25 25   GLUCOSE 119*  119* 223* 91 79  BUN 21  21 19 15 9   CREATININE 1.07  1.07 0.91 0.88 0.91  CALCIUM 9.6  9.6 9.4 8.3* 8.6*  AST 22 60*  --   --   ALT 18 187*  --   --   ALKPHOS 111 114  --   --   BILITOT 0.4 0.5  --   --    ------------------------------------------------------------------------------------------------------------------ estimated creatinine clearance is 56.8 mL/min (by C-G formula based on Cr of 0.91). ------------------------------------------------------------------------------------------------------------------ No results for input(s): HGBA1C in the last 72 hours. ------------------------------------------------------------------------------------------------------------------ No results for input(s): CHOL, HDL, LDLCALC, TRIG, CHOLHDL, LDLDIRECT in the last 72 hours. ------------------------------------------------------------------------------------------------------------------ No results for input(s): TSH, T4TOTAL, T3FREE, THYROIDAB in the last 72 hours.  Invalid input(s): FREET3 ------------------------------------------------------------------------------------------------------------------ No results for input(s): VITAMINB12, FOLATE, FERRITIN, TIBC, IRON, RETICCTPCT in the last 72 hours.  Coagulation profile No  results for input(s): INR, PROTIME in the last 168 hours.  No results for input(s): DDIMER in the last 72 hours.  Cardiac Enzymes No results for input(s): CKMB, TROPONINI, MYOGLOBIN in the last 168 hours.  Invalid input(s): CK ------------------------------------------------------------------------------------------------------------------ Invalid input(s): POCBNP    Assessment & Plan   Principal Problem:   Ileus, likely due to recent surgery as well as pain medications; continue supportive care, patient improving advance diet later tonight if improved Active Problems:   DM (diabetes mellitus), type 2, uncontrolled, with renal complications:ssi, due to poor by mouth intake, diabetic medication on hold.   Essential hypertension: Continue on lisinopril   Depression:  continue Zoloft      Code Status Orders        Start     Ordered   02/21/15 1020  Full code   Continuous     02/21/15 1019      DVT Prophylaxis  Lovenox   Lab Results  Component Value Date   PLT 291 02/22/2015     Time Spent in minutes 74minutes   Dustin Flock M.D on 02/23/2015 at 10:11 AM  Between 7am to 6pm - Pager - [phone]  After 6pm go to [email or web address] - password [email or web address] [facility]   Office  [phone]

## 2015-02-24 ENCOUNTER — Inpatient Hospital Stay: Payer: Medicare PPO

## 2015-02-24 LAB — GLUCOSE, CAPILLARY
Glucose-Capillary: 133 mg/dL — ABNORMAL HIGH (ref 65–99)
Glucose-Capillary: 127 mg/dL — ABNORMAL HIGH (ref 65–99)
Glucose-Capillary: 131 mg/dL — ABNORMAL HIGH (ref 65–99)
Glucose-Capillary: 188 mg/dL — ABNORMAL HIGH (ref 65–99)

## 2015-02-24 LAB — CBC
HCT: 34.6 % — ABNORMAL LOW (ref 35.0–47.0)
Hemoglobin: 11.3 g/dL — ABNORMAL LOW (ref 12.0–16.0)
MCH: 29 pg (ref 26.0–34.0)
MCHC: 32.6 g/dL (ref 32.0–36.0)
MCV: 88.8 fL (ref 80.0–100.0)
Platelets: 281 10*3/uL (ref 150–440)
RBC: 3.89 MIL/uL (ref 3.80–5.20)
RDW: 14.2 % (ref 11.5–14.5)
WBC: 5 10*3/uL (ref 3.6–11.0)

## 2015-02-24 LAB — BASIC METABOLIC PANEL
Anion gap: 7 (ref 5–15)
BUN: 9 mg/dL (ref 6–20)
CO2: 27 mmol/L (ref 22–32)
Calcium: 8.4 mg/dL — ABNORMAL LOW (ref 8.9–10.3)
Chloride: 106 mmol/L (ref 101–111)
Creatinine, Ser: 0.94 mg/dL (ref 0.44–1.00)
GFR calc Af Amer: >60 mL/min (ref >60)
GFR calc non Af Amer: >60 mL/min (ref >60)
Glucose, Bld: 126 mg/dL — ABNORMAL HIGH (ref 65–99)
Potassium: 3.8 mmol/L (ref 3.5–5.1)
Sodium: 140 mmol/L (ref 135–145)

## 2015-02-24 MED ORDER — SENNOSIDES-DOCUSATE SODIUM 8.6-50 MG PO TABS: 1.0000 | ORAL_TABLET | Freq: Every day | ORAL | Status: DC

## 2015-02-24 NOTE — Discharge Summary (Signed)
Sandra Hendrix, is a 69 y.o. female  DOB 13-Dec-1945  MRN [id number].  Admission date:  02/21/2015  Admitting Physician  Dustin Flock, MD  Discharge Date:  02/24/2015   Primary MD  Crecencio Mc, MD  Recommendations for primary care physician for things to follow up Follow up with orthopedics as an out  Pt.   Admission Diagnosis  Ileus, postoperative [K91.3]   Discharge Diagnosis  Ileus, postoperative [K91.3]   Principal Problem:   Ileus, postoperative Active Problems:   DM (diabetes mellitus), type 2, uncontrolled, with renal complications   Essential hypertension   Ileus      Past Medical History  Diagnosis Date  . Diabetes mellitus without complication   . Hyperlipidemia   . Hypertension   . Arthritis   . Depression   . GERD (gastroesophageal reflux disease)   . Allergy   . Urinary incontinence   . Varicose veins     Past Surgical History  Procedure Laterality Date  . Joint replacement Right 2007  . Joint replacement Left 2004  . Rotator cuff repair Right        History of present illness and  Hospital Course:     Kindly see H&P for history of present illness and admission details, please review complete Labs, Consult reports and Test reports for all details in brief  HPI  from the history and physical done on the day of admission  69 yr old female with h/o recent right rotator cuff repair admitted for nausea,vomiting,abdominal pain and constipation.admitted for  Post  Op  ileus due to pain med's.   Hospital Course the patient was kept npo,started on IV fluids,Iv nausea meds.her symptoms of nausea .vomting resolved.she had  3 bms yesterday,able to pass flatus ,tolerating diet.rpt abdominal xray shows mild ileus but pt has no abdominal pain.abomen soft,NT,BS present.so we are discharging  her,she is advsed to use narctoics sparingly,she also is advised to use  Daily stool softners 2.DMII;held metformin inially  due to nausea,vomiting.can restart  her Januvia,metformin,glipizide 3.depression; 4.htn    Discharge Condition:stable   Follow UP PMD Dr.Tullo  In one week      Discharge Instructions  and  Discharge Medications       Medication List    TAKE these medications        ACCU-CHEK AVIVA PLUS test strip  Generic drug:  glucose blood  Apply 1 each topically daily.     accu-chek softclix lancets  Use to test blood sugars 1 -2 times daily  albuterol 108 (90 BASE) MCG/ACT inhaler  Commonly known as:  PROVENTIL HFA;VENTOLIN HFA  Inhale 2 puffs into the lungs every 6 (six) hours as needed for wheezing.     aspirin EC 81 MG tablet  Take 81 mg by mouth daily.     atorvastatin 40 MG tablet  Commonly known as:  LIPITOR  Take 0.5 tablets (20 mg total) by mouth daily.     azelastine 0.1 % nasal spray  Commonly known as:  ASTEPRO  Place 1 spray into the nose 2 (two) times daily. Use in each nostril as directed     azithromycin 250 MG tablet  Commonly known as:  ZITHROMAX  Take 2 tablets by mouth on day 1, take 1 tablet by mouth each day after for 4 days.     BIOTIN PO  Take by mouth. Take 1000mg  by mouth twice daily.     CALCIUM 600 + D PO  Take 1 tablet by mouth daily.     D3-1000 PO  Take 1,000 Int'l Units by mouth every morning.     fish oil-omega-3 fatty acids 1000 MG capsule  Take 1 capsule by mouth daily.     fluticasone 50 MCG/ACT nasal spray  Commonly known as:  FLONASE  Place 1 spray into both nostrils daily.     GLIPIZIDE XL 10 MG 24 hr tablet  Generic drug:  glipiZIDE  TAKE 1 TABLET EVERY DAY     lisinopril 20 MG tablet  Commonly known as:  PRINIVIL,ZESTRIL  TAKE 1 TABLET EVERY DAY     Magnesium Oxide -Mg Supplement 250 MG Tabs  Take 250 mg by mouth daily.     Meclizine HCl 25 MG Chew  Chew 25 mg by mouth daily as needed  (for vertigo).     metformin 500 MG (OSM) 24 hr tablet  Commonly known as:  FORTAMET  TAKE 2 TABLETS TWICE DAILY WITH MEALS     multivitamin tablet  Take 1 tablet by mouth daily.     naproxen 500 MG tablet  Commonly known as:  NAPROSYN  Take 500 mg by mouth 2 (two) times daily as needed. For shoulder pain.     omeprazole 20 MG capsule  Commonly known as:  PRILOSEC  TAKE 1 CAPSULE EVERY DAY     ondansetron 4 MG disintegrating tablet  Commonly known as:  ZOFRAN-ODT  Take 4 mg by mouth every 8 (eight) hours as needed. Nausea and vomiting     Potassium 99 MG Tabs  Take 1 tablet by mouth daily.     REMIFEMIN 20 MG Tabs  Generic drug:  Black Cohosh  Take 20 mg by mouth 2 (two) times daily.     senna-docusate 8.6-50 MG per tablet  Commonly known as:  Senokot-S  Take 1 tablet by mouth daily.     sertraline 50 MG tablet  Commonly known as:  ZOLOFT  TAKE 1 TABLET EVERY DAY     sitaGLIPtin 25 MG tablet  Commonly known as:  JANUVIA  Take 1 tablet (25 mg total) by mouth daily.     verapamil 180 MG 24 hr capsule  Commonly known as:  VERELAN PM  TAKE 1 CAPSULE EVERY DAY          Diet and Activity recommendation: See Discharge Instructions above   Consults obtained none   Major procedures and Radiology Reports - PLEASE review detailed and final reports for all details, in brief -      Dg Abd 1 View  02/24/2015   CLINICAL DATA:  Constipation for 2 days. Shoulder surgery 1 week ago.  EXAM: ABDOMEN - 1 VIEW  COMPARISON:  None.  FINDINGS: Mildly distended gas-filled loops of small bowel are again noted.  Gas and stool in the colon again identified.  No suspicious calcifications are identified.  Degenerative changes in the lower lumbar spine and cholecystectomy clips again identified.  IMPRESSION: Little significant change in mildly distended small bowel loops - favor ileus.   Electronically Signed   By: Margarette Canada M.D.   On: 02/24/2015 14:58   Ct Abdomen Pelvis W  Contrast  02/21/2015   CLINICAL DATA:  Rotator cuff surgery on Tuesday, now feeling constipated with vomiting.  EXAM: CT ABDOMEN AND PELVIS WITH CONTRAST  TECHNIQUE: Multidetector CT imaging of the abdomen and pelvis was performed using the standard protocol following bolus administration of intravenous contrast.  CONTRAST:  179mL OMNIPAQUE IOHEXOL 300 MG/ML  SOLN  COMPARISON:  None.  FINDINGS: BODY WALL: No contributory findings.  LOWER CHEST: Bibasilar atelectasis.  Small hiatal hernia with large gastroesophageal reflux.  ABDOMEN/PELVIS:  Liver: No focal abnormality.  Biliary: Cholecystectomy.  No bile duct enlargement  Pancreas: Unremarkable.  Spleen: Unremarkable.  Adrenals: Unremarkable.  Kidneys and ureters: No hydronephrosis or stone.  Bladder: Unremarkable.  Reproductive: 3.4 cm simple appearing cyst in the left ovary.  Bowel: There is dilated fluid-filled proximal small bowel which gradually decreases in caliber without a discrete transition point. The colon is not decompressed, but rather diffusely contains stool. No indication of bowel necrosis or perforation. There is only mild interloop fluid in the central mesenteries. Normal appendix.  Retroperitoneum: No mass or adenopathy.  Peritoneum: No ascites or pneumoperitoneum.  Vascular: No acute abnormality.  OSSEOUS: No acute abnormalities.  IMPRESSION: 1. Dilated small bowel. Lack of transition point favors reactive ileus over partial obstruction. 2. 34 mm left ovarian cyst. Recommend 1 year followup pelvic ultrasound.   Electronically Signed   By: Monte Fantasia M.D.   On: 02/21/2015 04:18   Dg Abd 2 Views  02/23/2015   CLINICAL DATA:  Abdominal pain for 3 days  EXAM: ABDOMEN - 2 VIEW  COMPARISON:  02/21/2015  FINDINGS: There are multiple dilated loops of small bowel identified. The degree of bowel distention is not significantly improved from previous exam. The enteric contrast material has passed into the colon. There is gas at the level of the  rectum.  IMPRESSION: 1. No significant change in ileus pattern. 2. Interval transit of CT contrast material into the colon.   Electronically Signed   By: Kerby Moors M.D.   On: 02/23/2015 08:35    Micro Results    No results found for this or any previous visit (from the past 240 hour(s)).     Today   Subjective:   Sandra Hendrix today has no headache,no chest abdominal pain,no nausea,no vomiting,tolerating diet,wants to go home,  Objective:   Blood pressure 131/75, pulse 65, temperature 98.2 F (36.8 C), temperature source Oral, resp. rate 18, height 5\' 1"  (1.549 m), weight 83.371 kg (183 lb 12.8 oz), SpO2 98 %.   Intake/Output Summary (Last 24 hours) at 02/24/15 1621 Last data filed at 02/24/15 1115  Gross per 24 hour  Intake    840 ml  Output      0 ml  Net    840 ml    Exam Awake Alert, Oriented x 3, No new F.N deficits, Normal affect [place].AT,PERRAL Supple Neck,No JVD, No cervical lymphadenopathy appriciated.  Symmetrical  Chest wall movement, Good air movement bilaterally, CTAB RRR,No Gallops,Rubs or new Murmurs, No Parasternal Heave +ve B.Sounds, Abd Soft, Non tender, No organomegaly appriciated, No rebound -guarding or rigidity. No Cyanosis, Clubbing or edema, No new Rash or bruise  Data Review   CBC w Diff: Lab Results  Component Value Date   WBC 5.0 02/24/2015   HGB 11.3* 02/24/2015   HCT 34.6* 02/24/2015   PLT 281 02/24/2015   LYMPHOPCT 3 02/20/2015   MONOPCT 6 02/20/2015   EOSPCT 1 02/20/2015   BASOPCT 0 02/20/2015    CMP: Lab Results  Component Value Date   NA 140 02/24/2015   NA 142 11/18/2014   K 3.8 02/24/2015   CL 106 02/24/2015   CO2 27 02/24/2015   BUN 9 02/24/2015   BUN 21 11/18/2014   CREATININE 0.94 02/24/2015   CREATININE 1.1 11/18/2014   GLU 103 11/18/2014   PROT 7.5 02/20/2015   ALBUMIN 3.8 02/20/2015   BILITOT 0.5 02/20/2015   ALKPHOS 114 02/20/2015   AST 60* 02/20/2015   ALT 187* 02/20/2015  .   Total Time in  preparing paper work, data evaluation and todays exam - 36 minutes  Natashia Roseman M.D on 02/24/2015 at 4:21 PM

## 2015-02-24 NOTE — Progress Notes (Signed)
[place] [facility]                                                                                                                                                                                            Patient Demographics   Sandra Hendrix, is a 69 y.o. female, DOB - 1946/05/17, UDJ:[id number]  Admit date - 02/21/2015   Admitting Physician Dustin Flock, MD  Outpatient Primary MD for the patient is Crecencio Mc, MD         Subjective:  Feels better.no nausea,had 3 bms yesterday.passign gas.  Review of Systems:   CONSTITUTIONAL: No documented fever. No fatigue, weakness. No weight gain, no weight loss.  EYES: No blurry or double vision.  ENT: No tinnitus. No postnasal drip. No redness of the oropharynx.  RESPIRATORY: No cough, no wheeze, no hemoptysis. No dyspnea.  CARDIOVASCULAR: No chest pain. No orthopnea. No palpitations. No syncope.  GASTROINTESTINAL: No nausea, no vomiting or diarrhea. No abdominal pain. No melena or hematochezia.  GENITOURINARY: No dysuria or hematuria.  ENDOCRINE: No polyuria or nocturia. No heat or cold intolerance.  HEMATOLOGY: No anemia. No bruising. No bleeding.  INTEGUMENTARY: No rashes. No lesions.  MUSCULOSKELETAL: No arthritis. No swelling. No gout.  NEUROLOGIC: No numbness, tingling, or ataxia. No seizure-type activity.  PSYCHIATRIC: No anxiety. No insomnia. No ADD.    Vitals:   Filed Vitals:   02/23/15 1628 02/23/15 2355 02/24/15 0437 02/24/15 0740  BP: 144/66 139/46  141/54  Pulse: 59 63  57  Temp: 98.4 F (36.9 C) 98.4 F (36.9 C)  98.2 F (36.8 C)  TempSrc: Oral Oral  Oral  Resp: 16 20    Height:      Weight:   83.371 kg (183 lb 12.8 oz)   SpO2: 95% 95%  89%    Wt Readings from Last 3 Encounters:  02/24/15 83.371 kg (183 lb 12.8 oz)  02/10/15 82.736 kg (182 lb 6.4 oz)  11/07/14 78.699 kg (173 lb 8 oz)     Intake/Output Summary (Last 24 hours) at 02/24/15 1144 Last data filed  at 02/24/15 0745  Gross per 24 hour  Intake    600 ml  Output      0 ml  Net    600 ml    Physical Exam:   GENERAL: Pleasant-appearing in no apparent distress.  HEAD, EYES, EARS, NOSE AND THROAT: Atraumatic, normocephalic. Extraocular muscles are intact. Pupils equal and reactive to light. Sclerae anicteric. No conjunctival injection. No oro-pharyngeal erythema.  NECK: Supple. There is no jugular venous distention. No bruits, no lymphadenopathy, no thyromegaly.  HEART:  Regular rate and rhythm, tachycardic. No murmurs, no rubs, no clicks.  LUNGS: Clear to auscultation bilaterally. No rales or rhonchi. No wheezes.  ABDOMEN: Soft, flat, nontender, nondistended. Has good bowel sounds. No hepatosplenomegaly appreciated.  EXTREMITIES: No evidence of any cyanosis, clubbing, or peripheral edema.  +2 pedal and radial pulses bilaterally.  NEUROLOGIC: The patient is alert, awake, and oriented x3 with no focal motor or sensory deficits appreciated bilaterally.  SKIN: Moist and warm with no rashes appreciated.  Psych: Not anxious, depressed LN: No inguinal LN enlargement    Antibiotics   Anti-infectives    None      Medications   Scheduled Meds: . aspirin EC  81 mg Oral Daily  . atorvastatin  20 mg Oral Daily  . azelastine  1 spray Each Nare BID  . fluticasone  1 spray Each Nare Daily  . insulin aspart  0-9 Units Subcutaneous 6 times per day  . lisinopril  20 mg Oral Daily  . omega-3 acid ethyl esters  1 g Oral Daily  . pantoprazole  40 mg Oral BID  . sertraline  50 mg Oral Daily  . simethicone  80 mg Oral QID  . verapamil  180 mg Oral Daily   Continuous Infusions:   PRN Meds:.acetaminophen **OR** acetaminophen, albuterol, ondansetron **OR** ondansetron (ZOFRAN) IV   Data Review:   Micro Results No results found for this or any previous visit (from the past 240 hour(s)).  Radiology Reports Ct Abdomen Pelvis W Contrast  02/21/2015   CLINICAL DATA:  Rotator cuff surgery on  Tuesday, now feeling constipated with vomiting.  EXAM: CT ABDOMEN AND PELVIS WITH CONTRAST  TECHNIQUE: Multidetector CT imaging of the abdomen and pelvis was performed using the standard protocol following bolus administration of intravenous contrast.  CONTRAST:  163mL OMNIPAQUE IOHEXOL 300 MG/ML  SOLN  COMPARISON:  None.  FINDINGS: BODY WALL: No contributory findings.  LOWER CHEST: Bibasilar atelectasis.  Small hiatal hernia with large gastroesophageal reflux.  ABDOMEN/PELVIS:  Liver: No focal abnormality.  Biliary: Cholecystectomy.  No bile duct enlargement  Pancreas: Unremarkable.  Spleen: Unremarkable.  Adrenals: Unremarkable.  Kidneys and ureters: No hydronephrosis or stone.  Bladder: Unremarkable.  Reproductive: 3.4 cm simple appearing cyst in the left ovary.  Bowel: There is dilated fluid-filled proximal small bowel which gradually decreases in caliber without a discrete transition point. The colon is not decompressed, but rather diffusely contains stool. No indication of bowel necrosis or perforation. There is only mild interloop fluid in the central mesenteries. Normal appendix.  Retroperitoneum: No mass or adenopathy.  Peritoneum: No ascites or pneumoperitoneum.  Vascular: No acute abnormality.  OSSEOUS: No acute abnormalities.  IMPRESSION: 1. Dilated small bowel. Lack of transition point favors reactive ileus over partial obstruction. 2. 34 mm left ovarian cyst. Recommend 1 year followup pelvic ultrasound.   Electronically Signed   By: Monte Fantasia M.D.   On: 02/21/2015 04:18   Dg Abd 2 Views  02/23/2015   CLINICAL DATA:  Abdominal pain for 3 days  EXAM: ABDOMEN - 2 VIEW  COMPARISON:  02/21/2015  FINDINGS: There are multiple dilated loops of small bowel identified. The degree of bowel distention is not significantly improved from previous exam. The enteric contrast material has passed into the colon. There is gas at the level of the rectum.  IMPRESSION: 1. No significant change in ileus pattern. 2.  Interval transit of CT contrast material into the colon.   Electronically Signed   By: Queen Slough.D.  On: 02/23/2015 08:35     CBC  Recent Labs Lab 02/20/15 2137 02/22/15 0553 02/24/15 0510  WBC 6.6 5.0 5.0  HGB 12.9 12.0 11.3*  HCT 39.7 36.0 34.6*  PLT 280 291 281  MCV 89.9 89.2 88.8  MCH 29.3 29.8 29.0  MCHC 32.6 33.4 32.6  RDW 14.5 14.1 14.2  LYMPHSABS 0.2*  --   --   MONOABS 0.4  --   --   EOSABS 0.0  --   --   BASOSABS 0.0  --   --     Chemistries   Recent Labs Lab 02/20/15 2137 02/22/15 0553 02/23/15 0459 02/24/15 0510  NA 135 140 143 140  K 4.4 3.8 3.4* 3.8  CL 102 108 110 106  CO2 23 25 25 27   GLUCOSE 223* 91 79 126*  BUN 19 15 9 9   CREATININE 0.91 0.88 0.91 0.94  CALCIUM 9.4 8.3* 8.6* 8.4*  AST 60*  --   --   --   ALT 187*  --   --   --   ALKPHOS 114  --   --   --   BILITOT 0.5  --   --   --    ------------------------------------------------------------------------------------------------------------------ estimated creatinine clearance is 54.5 mL/min (by C-G formula based on Cr of 0.94). ------------------------------------------------------------------------------------------------------------------ No results for input(s): HGBA1C in the last 72 hours. ------------------------------------------------------------------------------------------------------------------ No results for input(s): CHOL, HDL, LDLCALC, TRIG, CHOLHDL, LDLDIRECT in the last 72 hours. ------------------------------------------------------------------------------------------------------------------ No results for input(s): TSH, T4TOTAL, T3FREE, THYROIDAB in the last 72 hours.  Invalid input(s): FREET3 ------------------------------------------------------------------------------------------------------------------ No results for input(s): VITAMINB12, FOLATE, FERRITIN, TIBC, IRON, RETICCTPCT in the last 72 hours.  Coagulation profile No results for input(s): INR, PROTIME  in the last 168 hours.  No results for input(s): DDIMER in the last 72 hours.  Cardiac Enzymes No results for input(s): CKMB, TROPONINI, MYOGLOBIN in the last 168 hours.  Invalid input(s): CK ------------------------------------------------------------------------------------------------------------------ Invalid input(s): POCBNP    Assessment & Plan   Principal Problem:   Ileus, likely due to recent surgery as well as pain medications; continue supportive care, patient improving ;possible dc later today Active Problems:   DM (diabetes mellitus), type 2, uncontrolled, with renal complications:ssi, due to poor by mouth intake, diabetic medication on hold.   Essential hypertension: Continue on lisinopril   Depression:  continue Zoloft      Code Status Orders        Start     Ordered   02/21/15 1020  Full code   Continuous     02/21/15 1019      DVT Prophylaxis  Lovenox   Lab Results  Component Value Date   PLT 281 02/24/2015     Time Spent in minutes 62minutes   Dave Mannes M.D on 02/24/2015 at 11:44 AM  Between 7am to 6pm - Pager - [phone]  After 6pm go to [email or web address] - password [email or web address] [facility]   Office  [phone]

## 2015-02-25 ENCOUNTER — Ambulatory Visit: Payer: Medicare PPO | Admitting: Internal Medicine

## 2015-02-25 ENCOUNTER — Telehealth: Payer: Self-pay | Admitting: *Deleted

## 2015-02-25 NOTE — Telephone Encounter (Signed)
Discharge Date: 02/24/15  Transition Care Management Follow-up Telephone Call  How have you been since you were released from the hospital? Pt states doing well. Denies abdominal pain, nausea or vomiting.    Do you understand why you were in the hospital? YES, post op ileus, had right rotator cuff repair 02/17/15   Do you understand the discharge instructions? YES, pt has stopped narcotics, pain is well tolerated on Tylenol 2 tabs every 6 hours prn.   Items Reviewed:  Medications reviewed: YES, no new Rxs given. Pt has stopped narcotics, taking Tylenol with relief, see above. She is also taking daily stool softener as advised.   Allergies reviewed: YES  Dietary changes reviewed: YES, following a soft diet and progressing as tolerated.   Referrals reviewed: n/a   Functional Questionnaire:   Activities of Daily Living (ADLs):  She states they are independent in the following: All States they require assistance with the following: None   Any transportation issues/concerns?: NO, will driver herself to her appt.    Any patient concerns? None at this time. Advised to call back with any questions or concerns prior to her appt,  verbalized understanding   Confirmed importance and date/time of follow-up visits scheduled: YES. Has appt scheduled with Dr. Derrel Nip 03/03/15 @ 2:15. Has follow up scheduled with orthopedic doctor as well for s/p right rotator cuff surgery 02/17/15.   Confirmed with patient if condition begins to worsen call PCP or go to the ER. Patient was given the Call-a-Nurse line [phone]: YES

## 2015-02-26 ENCOUNTER — Ambulatory Visit: Payer: Medicare PPO | Attending: Orthopedic Surgery

## 2015-02-26 DIAGNOSIS — R29898 Other symptoms and signs involving the musculoskeletal system: Secondary | ICD-10-CM | POA: Insufficient documentation

## 2015-02-26 DIAGNOSIS — M25511 Pain in right shoulder: Secondary | ICD-10-CM | POA: Diagnosis not present

## 2015-02-26 DIAGNOSIS — Z4789 Encounter for other orthopedic aftercare: Secondary | ICD-10-CM

## 2015-02-26 DIAGNOSIS — Z9889 Other specified postprocedural states: Secondary | ICD-10-CM

## 2015-02-26 NOTE — Patient Instructions (Signed)
Gave seated R forearm flexion (shoulder in sling and elbow supported with pillows) 10x3, as well as crumbling papers or squeezing a ball with her R hand as part of her HEP with pain free level of effort. Patient demonstrated and verbalized understanding.  Improved exercise technique, movement at target joints, use of target muscles after mod to max verbal, visual, tactile cues.

## 2015-02-26 NOTE — Therapy (Signed)
[facility] [address], [place], [address] Phone: [phone]   Fax:  [phone]  Physical Therapy Evaluation  Patient Details  Name: Sandra Hendrix MRN: [id number] Date of Birth: Oct 16, 1945 Referring Provider:  Justice Britain, MD  Encounter Date: 02/26/2015      PT End of Session - 02/26/15 0911    Visit Number 1   Number of Visits 17   Date for PT Re-Evaluation 04/23/15   Authorization Type 1   Authorization Time Period 10   PT Start Time 0911   PT Stop Time 1021   PT Time Calculation (min) 70 min   Activity Tolerance Patient tolerated treatment well;Patient limited by pain   Behavior During Therapy [facility] for tasks assessed/performed      Past Medical History  Diagnosis Date  . Diabetes mellitus without complication   . Hyperlipidemia   . Hypertension   . Arthritis   . Depression   . GERD (gastroesophageal reflux disease)   . Allergy   . Urinary incontinence   . Varicose veins     Past Surgical History  Procedure Laterality Date  . Joint replacement Right 2007  . Joint replacement Left 2004  . Rotator cuff repair Right     There were no vitals filed for this visit.  Visit Diagnosis:  S/P rotator cuff repair - Plan: PT plan of care cert/re-cert  Orthopedic aftercare - Plan: PT plan of care cert/re-cert  Pain in joint, shoulder region, right - Plan: PT plan of care cert/re-cert  Weakness of shoulder - Plan: PT plan of care cert/re-cert      Subjective Assessment - 02/26/15 0916    Subjective Pain level: current 4/10; best 0/10; worst 10/10. Patient states that she started having a lot of pain in her R shoulder for a while which was aggravated 3 months ago.    Pertinent History S/P R RCR last Tuesday Feb 17, 2015 with debridement of spur.  Has not physical therapy yet. Does not know of any special instructions for her R shoulder from her surgeon. Next MD appointment June 27th.  Currently has difficulty with  grooming, putting  her earings on, donning and doffing clothes.  Takes Tylenol 325 x2  every 6 hours. Just got out of the hospital Tuesday  Feb 24, 2015 secondary to bowel shut down from taking Oxycodone.    Patient Stated Goals Wants to be able to go to [facility] July 1 - 11, 2016 with her 2 grandchildren to visit her sister and her sister's grand children. To be able to use her R shoulder with not pain.    Currently in Pain? Yes   Pain Score 4    Pain Location Shoulder   Pain Orientation Right   Pain Type Acute pain;Surgical pain   Multiple Pain Sites No            [facility] PT Assessment - 02/26/15 0001    Assessment   Medical Diagnosis R RCR, SAD, DCR   Onset Date/Surgical Date 02/17/15   Next MD Visit 03/23/2015   Prior Therapy no   Precautions   Precaution Comments None issued by MD. Per standard protocols   Restrictions   Other Position/Activity Restrictions None issued by MD.  Per standard protocols.    Balance Screen   Has the patient fallen in the past 6 months No   Has the patient had a decrease in activity level because of a fear of falling?  No   Is the patient reluctant to leave their home because of a fear of falling?  No   Prior Function   Vocation Retired   Biomedical scientist PLOF: able to raise her R arm, groom, put earings on, donn and doff clothes but with pain and difficulty   Observation/Other Assessments   Observations posture: bilaterally protracted shoulders and neck, R shoulder in sling, R arm in abduction and IR.    Quick DASH  88.6 %   PROM   Overall PROM Comments R shoulder flexion supine PROM 55 degrees; supine abduction PROM 65 degrees; supine ER in neutral abduction 0 degrees (stiff end feel).    Palpation   Palpation comment TTP anterior shoulder R; increased muscle tone UT muscles, swelling mid clavicular and thoracic outlet area.             [facility] Adult PT Treatment/Exercise - 02/26/15 1118    Shoulder  Exercises: Supine   Other Supine Exercises PROM (by PT) R  shoulder flexion and abduction.    Shoulder Exercises: Seated   Other Seated Exercises Directed patient with seated R forearm flexion (elbow supported by pillows) 10x, seated crumbling paper by making a fist multiple times. Aforementioned exercises given as part of her HEP (pain free level of effort; pt demonstrated and verbalized understanding).    Other Seated Exercises Patient education with sleeping in her recliner and supporting her posterior R arm with pillows to help protect surgery as well as for comfort.    Cryotherapy   Number Minutes Cryotherapy 15 Minutes   Cryotherapy Location Shoulder  Right, sitting with sling on   Type of Cryotherapy Ice pack           PT Education - 02/26/15 1145    Education provided Yes   Education Details Ther-ex, HEP, arm positioning when sleeping   Person(s) Educated Patient   Methods Explanation;Demonstration;Tactile cues;Verbal cues   Comprehension Verbalized understanding;Returned demonstration             PT Long Term Goals - 02/26/15 1907    PT LONG TERM GOAL #1   Title Patient will be independent with her HEP to improve PROM, A/AROM abd ability to raise her R arm when appropriate.    Time 12   Period Weeks   Status New   PT LONG TERM GOAL #2   Title Patient will improve her Quick Dash score by at least 14% as a demonstration of improved function.   Time 12   Period Weeks   Status New   PT LONG TERM GOAL #3   Title Patient will improve R shoulder flexion, abduction PROM to at least 125 degrees to promote ability to raise her R arm.    Time 12   Period Weeks   Status New   PT LONG TERM GOAL #4   Title Patient will improve R shoulder flexion and abduction AAROM to at least 110 degrees to promote ability to raise her R arm when appropriate.    Time 12   Period Weeks   Status New   PT LONG TERM GOAL #5   Title Patient will improve R shouler ER to 60 degrees AROM and  IR to at least 60 degrees AROM when appropriate to promote ability to groom, and don and doff clothes.    Time 12   Period Weeks   Status New           Plan - 02/26/15 1843    Clinical Impression Statement  Patient is a 69 year old female who came to physical therapy S/P R RCR, SAD, DCR on 02/17/15. Patient is currently 1 week post op. She presents with limited PROM, R UE weakness, TTP R shoulder, altered posture, swelling, and difficulty performing functional tasks such as grooming, and donning and doffing clothes. Patient will benefit from skilled physical therapy intervention to address the aforementioned deficits.    Pt will benefit from skilled therapeutic intervention in order to improve on the following deficits Decreased range of motion;Pain;Improper body mechanics;Decreased strength   Rehab Potential Good   PT Frequency 2x / week   PT Duration 12 weeks   PT Treatment/Interventions Aquatic Therapy;Ultrasound;Neuromuscular re-education;Patient/family education;Passive range of motion;Cryotherapy;Electrical Stimulation;Therapeutic activities;Iontophoresis 4mg /ml Dexamethasone;Moist Heat;Therapeutic exercise;Manual techniques   PT Next Visit Plan gentle PROM not past 90 degrees flexion or abduction, gentle ER   Consulted and Agree with Plan of Care Patient          G-Codes - 10-Mar-2015 1917    Functional Assessment Tool Used Katina Dung, patient interview   Functional Limitation Carrying, moving and handling objects   Carrying, Moving and Handling Objects Current Status [phone]) At least 80 percent but less than 100 percent impaired, limited or restricted   Carrying, Moving and Handling Objects Goal Status ([id number]) At least 20 percent but less than 40 percent impaired, limited or restricted       Problem List Patient Active Problem List   Diagnosis Date Noted  . Ileus, postoperative 02/21/2015  . Ileus 02/21/2015  . CKD (chronic kidney disease) stage 3, GFR 30-59 ml/min 11/07/2014   . UTI (urinary tract infection) 02/08/2014  . Acid reflux 12/26/2013  . Irritable bowel syndrome with constipation 12/26/2013  . Welcome to Medicare preventive visit 10/21/2013  . Hyperlipidemia LDL goal <100 07/15/2013  . Obesity 07/15/2013  . Chronic venous insufficiency 03/21/2013  . DM (diabetes mellitus), type 2, uncontrolled, with renal complications 92/09/69  . Lack of libido 11/05/2012  . Barrett's esophagus 11/05/2012  . Hiatal hernia 11/05/2012  . Essential hypertension 11/05/2012  . Urinary incontinence     Winson Eichorn March 10, 2015, 7:26 PM  [facility] [address] [place], [address] Phone: [phone]   Fax:  [phone]

## 2015-03-02 ENCOUNTER — Ambulatory Visit: Payer: Medicare PPO

## 2015-03-02 DIAGNOSIS — Z4789 Encounter for other orthopedic aftercare: Secondary | ICD-10-CM

## 2015-03-02 DIAGNOSIS — M25511 Pain in right shoulder: Secondary | ICD-10-CM

## 2015-03-02 DIAGNOSIS — R29898 Other symptoms and signs involving the musculoskeletal system: Secondary | ICD-10-CM | POA: Diagnosis not present

## 2015-03-02 DIAGNOSIS — Z9889 Other specified postprocedural states: Secondary | ICD-10-CM

## 2015-03-02 NOTE — Therapy (Signed)
[facility] [address], [place], [address] Phone: [phone]   Fax:  [phone]  Physical Therapy Treatment  Patient Details  Name: Sandra Hendrix MRN: [id number] Date of Birth: 11-Dec-1945 Referring Provider:  Justice Britain, MD  Encounter Date: 03/02/2015      PT End of Session - 03/02/15 0913    Visit Number 2   Number of Visits 17   Date for PT Re-Evaluation 04/23/15   Authorization Type 2   Authorization Time Period 10   PT Start Time 0912   PT Stop Time 1000   PT Time Calculation (min) 48 min   Activity Tolerance Patient tolerated treatment well;Patient limited by pain   Behavior During Therapy [facility] for tasks assessed/performed      Past Medical History  Diagnosis Date  . Diabetes mellitus without complication   . Hyperlipidemia   . Hypertension   . Arthritis   . Depression   . GERD (gastroesophageal reflux disease)   . Allergy   . Urinary incontinence   . Varicose veins     Past Surgical History  Procedure Laterality Date  . Joint replacement Right 2007  . Joint replacement Left 2004  . Rotator cuff repair Right     There were no vitals filed for this visit.  Visit Diagnosis:  S/P rotator cuff repair  Orthopedic aftercare  Pain in joint, shoulder region, right  Weakness of shoulder      Subjective Assessment - 03/02/15 0914    Subjective Keeping R shoulder pain under control with the Tylenol. Took some this morning.    Patient Stated Goals Wants to be able to go to [facility] July 1 - 11, 2016 with her 2 grandchildren to visit her sister and her sister's grand children. To be able to use her R shoulder with not pain.    Currently in Pain? Yes   Pain Score 2    Pain Location --  shoulder   Pain Orientation Right   Pain Type Surgical pain   Multiple Pain Sites No           [facility] Adult PT Treatment/Exercise - 03/02/15 0949    Shoulder Exercises: Supine   Other  Supine Exercises Directed patient with supine R elbow flexion 10x3 with R arm in neutral.    Cryotherapy   Number Minutes Cryotherapy 15 Minutes   Cryotherapy Location Shoulder  Right, sitting with sling on   Type of Cryotherapy Ice pack   Manual Therapy   Manual therapy comments Supine PROM R shoulder abduction, scaption, ER ( in scapular plane and in neutral), flexion.             PT Long Term Goals - 02/26/15 1907    PT LONG TERM GOAL #1   Title Patient will be independent with her HEP to improve PROM, A/AROM abd ability to raise her R arm when appropriate.    Time 12   Period Weeks   Status New   PT LONG TERM GOAL #2   Title Patient will improve her Quick Dash score by at least 14% as a demonstration of improved function.   Time 12   Period Weeks   Status New   PT LONG TERM GOAL #3   Title Patient will improve R shoulder flexion, abduction PROM to at least 125 degrees to promote ability to raise her R arm.    Time 12   Period Weeks   Status New  PT LONG TERM GOAL #4   Title Patient will improve R shoulder flexion and abduction AAROM to at least 110 degrees to promote ability to raise her R arm when appropriate.    Time 12   Period Weeks   Status New   PT LONG TERM GOAL #5   Title Patient will improve R shouler ER to 60 degrees AROM and IR to at least 60 degrees AROM when appropriate to promote ability to groom, and don and doff clothes.    Time 12   Period Weeks   Status New               Plan - 03/02/15 1931    Clinical Impression Statement Patient tolerated session well without aggravation of R shoulder pain. Difficulty with end range PROM secondary to pain.    Pt will benefit from skilled therapeutic intervention in order to improve on the following deficits Decreased range of motion;Pain;Improper body mechanics;Decreased strength   Rehab Potential Good   PT Frequency 2x / week   PT Duration 12 weeks   PT Treatment/Interventions Aquatic  Therapy;Ultrasound;Neuromuscular re-education;Patient/family education;Passive range of motion;Cryotherapy;Electrical Stimulation;Therapeutic activities;Iontophoresis 4mg /ml Dexamethasone;Moist Heat;Therapeutic exercise;Manual techniques   PT Next Visit Plan gentle PROM not past 90 degrees flexion or abduction, gentle ER   Consulted and Agree with Plan of Care Patient        Problem List Patient Active Problem List   Diagnosis Date Noted  . Ileus, postoperative 02/21/2015  . Ileus 02/21/2015  . CKD (chronic kidney disease) stage 3, GFR 30-59 ml/min 11/07/2014  . UTI (urinary tract infection) 02/08/2014  . Acid reflux 12/26/2013  . Irritable bowel syndrome with constipation 12/26/2013  . Welcome to Medicare preventive visit 10/21/2013  . Hyperlipidemia LDL goal <100 07/15/2013  . Obesity 07/15/2013  . Chronic venous insufficiency 03/21/2013  . DM (diabetes mellitus), type 2, uncontrolled, with renal complications 03/55/9741  . Lack of libido 11/05/2012  . Barrett's esophagus 11/05/2012  . Hiatal hernia 11/05/2012  . Essential hypertension 11/05/2012  . Urinary incontinence     Laygo,Miguel 03/02/2015, 7:44 PM  [facility] [address] [place], [address] Phone: [phone]   Fax:  [phone]

## 2015-03-02 NOTE — Patient Instructions (Signed)
Patient was instructed to continue putting ice on her R shoulder 4-5x per day instead of the 3x/day she mentioned she is doing. Patient verbalized understanding.

## 2015-03-03 ENCOUNTER — Ambulatory Visit (INDEPENDENT_AMBULATORY_CARE_PROVIDER_SITE_OTHER): Payer: Medicare PPO | Admitting: Internal Medicine

## 2015-03-03 VITALS — BP 116/72 | HR 67 | Temp 97.8°F | Resp 16 | Ht 61.0 in | Wt 178.4 lb

## 2015-03-03 DIAGNOSIS — E1129 Type 2 diabetes mellitus with other diabetic kidney complication: Secondary | ICD-10-CM

## 2015-03-03 DIAGNOSIS — E1165 Type 2 diabetes mellitus with hyperglycemia: Secondary | ICD-10-CM | POA: Diagnosis not present

## 2015-03-03 DIAGNOSIS — K913 Postprocedural intestinal obstruction: Secondary | ICD-10-CM

## 2015-03-03 DIAGNOSIS — K567 Ileus, unspecified: Secondary | ICD-10-CM

## 2015-03-03 DIAGNOSIS — K9189 Other postprocedural complications and disorders of digestive system: Secondary | ICD-10-CM

## 2015-03-03 DIAGNOSIS — N63 Unspecified lump in unspecified breast: Secondary | ICD-10-CM

## 2015-03-03 DIAGNOSIS — IMO0002 Reserved for concepts with insufficient information to code with codable children: Secondary | ICD-10-CM

## 2015-03-03 DIAGNOSIS — N6320 Unspecified lump in the left breast, unspecified quadrant: Secondary | ICD-10-CM

## 2015-03-03 NOTE — Progress Notes (Signed)
Subjective:  Patient ID: Sandra Hendrix, female    DOB: [birth year]  Age: 69 y.o. MRN: [id number]  CC: There were no encounter diagnoses.  [name] presents for hospital follow up.  She was admitted  To [facility] on May 28 and discharged on May 31st . Admission diagnosis was a  postop ileus following right rotator cuff surgery. She is currently taking dulcolax daily,  And bowels are moving very other day. She has discontinued use of narcotics to avoid recurrence of opioid induced constipation and is managing her pain with 650 mg tylenol  every 6 hours.   2) Follow up on DM .  Last a1c  was markedly improved.  Had a low blood sugar recently early morning.    Lab Results  Component Value Date   HGBA1C 6.7* 02/16/2015    3) left breast  Nipple change.  Patient states tat for the past month her left nipple has been tender to palpation and itchy  Outpatient Prescriptions Prior to Visit  Medication Sig Dispense Refill  . ACCU-CHEK AVIVA PLUS test strip Apply 1 each topically daily.    Marland Kitchen albuterol (PROVENTIL HFA;VENTOLIN HFA) 108 (90 BASE) MCG/ACT inhaler Inhale 2 puffs into the lungs every 6 (six) hours as needed for wheezing.    Marland Kitchen aspirin EC 81 MG tablet Take 81 mg by mouth daily.    Marland Kitchen atorvastatin (LIPITOR) 40 MG tablet Take 0.5 tablets (20 mg total) by mouth daily. (Patient taking differently: Take 20 mg by mouth at bedtime. ) 90 tablet 1  . azelastine (ASTEPRO) 137 MCG/SPRAY nasal spray Place 1 spray into the nose 2 (two) times daily. Use in each nostril as directed (Patient taking differently: Place 1 spray into the nose daily. Use in each nostril as directed) 30 mL 11  . BIOTIN PO Take by mouth. Take 1000mg  by mouth twice daily.    . Black Cohosh (REMIFEMIN) 20 MG TABS Take 20 mg by mouth 2 (two) times daily.     . Calcium Carbonate-Vitamin D (CALCIUM 600 + D PO) Take 1 tablet by mouth daily.    . Cholecalciferol (D3-1000 PO) Take 1,000 Int'l Units by mouth every morning.     .  fish oil-omega-3 fatty acids 1000 MG capsule Take 1 capsule by mouth daily.    . fluticasone (FLONASE) 50 MCG/ACT nasal spray Place 1 spray into both nostrils daily. 48 g 1  . GLIPIZIDE XL 10 MG 24 hr tablet TAKE 1 TABLET EVERY DAY 90 tablet 1  . Lancet Devices (ACCU-CHEK SOFTCLIX) lancets Use to test blood sugars 1 -2 times daily 1 each 3  . lisinopril (PRINIVIL,ZESTRIL) 20 MG tablet TAKE 1 TABLET EVERY DAY 90 tablet 1  . Magnesium Oxide -Mg Supplement 250 MG TABS Take 250 mg by mouth daily.    . Meclizine HCl 25 MG CHEW Chew 25 mg by mouth daily as needed (for vertigo).     Marland Kitchen metformin (FORTAMET) 500 MG (OSM) 24 hr tablet TAKE 2 TABLETS TWICE DAILY WITH MEALS 360 tablet 1  . Multiple Vitamin (MULTIVITAMIN) tablet Take 1 tablet by mouth daily.    Marland Kitchen omeprazole (PRILOSEC) 20 MG capsule TAKE 1 CAPSULE EVERY DAY 90 capsule 1  . Potassium 99 MG TABS Take 1 tablet by mouth daily.    Marland Kitchen senna-docusate (SENOKOT-S) 8.6-50 MG per tablet Take 1 tablet by mouth daily. 30 tablet 0  . sertraline (ZOLOFT) 50 MG tablet TAKE 1 TABLET EVERY DAY 90 tablet 2  .  sitaGLIPtin (JANUVIA) 25 MG tablet Take 1 tablet (25 mg total) by mouth daily. 90 tablet 1  . verapamil (VERELAN PM) 180 MG 24 hr capsule TAKE 1 CAPSULE EVERY DAY 90 capsule 1  . azithromycin (ZITHROMAX) 250 MG tablet Take 2 tablets by mouth on day 1, take 1 tablet by mouth each day after for 4 days. (Patient not taking: Reported on 03/03/2015) 6 each 0  . naproxen (NAPROSYN) 500 MG tablet Take 500 mg by mouth 2 (two) times daily as needed. For shoulder pain.    Marland Kitchen ondansetron (ZOFRAN-ODT) 4 MG disintegrating tablet Take 4 mg by mouth every 8 (eight) hours as needed. Nausea and vomiting     No facility-administered medications prior to visit.    Review of Systems;  Patient denies headache, fevers, malaise, unintentional weight loss, skin rash, eye pain, sinus congestion and sinus pain, sore throat, dysphagia,  hemoptysis , cough, dyspnea, wheezing, chest  pain, palpitations, orthopnea, edema, abdominal pain, nausea, melena, diarrhea, constipation, flank pain, dysuria, hematuria, urinary  Frequency, nocturia, numbness, tingling, seizures,  Focal weakness, Loss of consciousness,  Tremor, insomnia, depression, anxiety, and suicidal ideation.      Objective:  There were no vitals taken for this visit.  BP Readings from Last 3 Encounters:  02/24/15 131/75  02/10/15 118/70  11/07/14 100/68    Wt Readings from Last 3 Encounters:  02/24/15 183 lb 12.8 oz (83.371 kg)  02/10/15 182 lb 6.4 oz (82.736 kg)  11/07/14 173 lb 8 oz (78.699 kg)    General appearance: alert, cooperative and appears stated age Ears: normal TM's and external ear canals both ears Throat: lips, mucosa, and tongue normal; teeth and gums normal Neck: no adenopathy, no carotid bruit, supple, symmetrical, trachea midline and thyroid not enlarged, symmetric, no tenderness/mass/nodules Back: symmetric, no curvature. ROM normal. No CVA tenderness. Lungs: clear to auscultation bilaterally Heart: regular rate and rhythm, S1, S2 normal, no murmur, click, rub or gallop Abdomen: soft, non-tender; bowel sounds normal; no masses,  no organomegaly Pulses: 2+ and symmetric Skin: Skin color, texture, turgor normal. No rashes or lesions Lymph nodes: Cervical, supraclavicular, and axillary nodes normal.  Lab Results  Component Value Date   HGBA1C 6.7* 02/16/2015   HGBA1C 6.9* 11/07/2014   HGBA1C 7.7* 08/07/2014    Lab Results  Component Value Date   CREATININE 0.94 02/24/2015   CREATININE 0.91 02/23/2015   CREATININE 0.88 02/22/2015    Lab Results  Component Value Date   WBC 5.0 02/24/2015   HGB 11.3* 02/24/2015   HCT 34.6* 02/24/2015   PLT 281 02/24/2015   GLUCOSE 126* 02/24/2015   CHOL 199 02/16/2015   TRIG 96.0 02/16/2015   HDL 77.50 11/07/2014   LDLDIRECT 98.7 07/15/2013   LDLCALC 76 11/07/2014   ALT 187* 02/20/2015   AST 60* 02/20/2015   NA 140 02/24/2015   K  3.8 02/24/2015   CL 106 02/24/2015   CREATININE 0.94 02/24/2015   BUN 9 02/24/2015   CO2 27 02/24/2015   TSH 2.69 11/07/2014   INR 1.1 01/23/2009   HGBA1C 6.7* 02/16/2015   MICROALBUR 0.7 02/16/2015    Ct Abdomen Pelvis W Contrast  02/21/2015   CLINICAL DATA:  Rotator cuff surgery on Tuesday, now feeling constipated with vomiting.  EXAM: CT ABDOMEN AND PELVIS WITH CONTRAST  TECHNIQUE: Multidetector CT imaging of the abdomen and pelvis was performed using the standard protocol following bolus administration of intravenous contrast.  CONTRAST:  183mL OMNIPAQUE IOHEXOL 300 MG/ML  SOLN  COMPARISON:  None.  FINDINGS: BODY WALL: No contributory findings.  LOWER CHEST: Bibasilar atelectasis.  Small hiatal hernia with large gastroesophageal reflux.  ABDOMEN/PELVIS:  Liver: No focal abnormality.  Biliary: Cholecystectomy.  No bile duct enlargement  Pancreas: Unremarkable.  Spleen: Unremarkable.  Adrenals: Unremarkable.  Kidneys and ureters: No hydronephrosis or stone.  Bladder: Unremarkable.  Reproductive: 3.4 cm simple appearing cyst in the left ovary.  Bowel: There is dilated fluid-filled proximal small bowel which gradually decreases in caliber without a discrete transition point. The colon is not decompressed, but rather diffusely contains stool. No indication of bowel necrosis or perforation. There is only mild interloop fluid in the central mesenteries. Normal appendix.  Retroperitoneum: No mass or adenopathy.  Peritoneum: No ascites or pneumoperitoneum.  Vascular: No acute abnormality.  OSSEOUS: No acute abnormalities.  IMPRESSION: 1. Dilated small bowel. Lack of transition point favors reactive ileus over partial obstruction. 2. 34 mm left ovarian cyst. Recommend 1 year followup pelvic ultrasound.   Electronically Signed   By: Monte Fantasia M.D.   On: 02/21/2015 04:18    Assessment & Plan:   Problem List Items Addressed This Visit    None      I am having Sandra Hendrix maintain her  Cholecalciferol (D3-1000 PO), BIOTIN PO, multivitamin, Calcium Carbonate-Vitamin D (CALCIUM 600 + D PO), Potassium, fish oil-omega-3 fatty acids, Black Cohosh, Meclizine HCl, albuterol, azelastine, fluticasone, accu-chek softclix, sitaGLIPtin, atorvastatin, metformin, omeprazole, verapamil, GLIPIZIDE XL, sertraline, lisinopril, azithromycin, aspirin EC, naproxen, ondansetron, ACCU-CHEK AVIVA PLUS, Magnesium Oxide -Mg Supplement, and senna-docusate.  No orders of the defined types were placed in this encounter.    There are no discontinued medications.  Follow-up: No Follow-up on file.   Crecencio Mc, MD

## 2015-03-03 NOTE — Patient Instructions (Signed)
I am ordering a diagnostic mammogram of your left breast to investigate your nipple changes  You are not due for DM checkup until after august 27

## 2015-03-03 NOTE — Progress Notes (Signed)
Pre visit review using our clinic review tool, if applicable. No additional management support is needed unless otherwise documented below in the visit note. 

## 2015-03-04 DIAGNOSIS — N6320 Unspecified lump in the left breast, unspecified quadrant: Secondary | ICD-10-CM | POA: Insufficient documentation

## 2015-03-04 NOTE — Assessment & Plan Note (Signed)
She has had persistent nipple pain on the left and pruritus . The nipple pigmentation pattern also appears to have changed.  Diagnostic mammogram ordered

## 2015-03-04 NOTE — Progress Notes (Signed)
Subjective:  Patient ID: Sandra Hendrix, female    DOB: 04/30/1946  Age: 69 y.o. MRN: [id number]  CC: The primary encounter diagnosis was Breast mass. Diagnoses of DM (diabetes mellitus), type 2, uncontrolled, with renal complications and Mass of left breast were also pertinent to this visit.  [name] presents for hospital  Follow up .  Admitted to [facility] on March 28 with post perative ileus secondary to opioid induced constipation.  Discharged March 31.  Not taking any narcotics. Bowels moving every other day.  Using dulcolax prn .Marland Kitchen  2) DM>  Blood sugars are well controlled, with one recent low due to not eating.   3) change in left breast nipple  Accompanied by itching, for the past month  Outpatient Prescriptions Prior to Visit  Medication Sig Dispense Refill  . ACCU-CHEK AVIVA PLUS test strip Apply 1 each topically daily.    Marland Kitchen albuterol (PROVENTIL HFA;VENTOLIN HFA) 108 (90 BASE) MCG/ACT inhaler Inhale 2 puffs into the lungs every 6 (six) hours as needed for wheezing.    Marland Kitchen aspirin EC 81 MG tablet Take 81 mg by mouth daily.    Marland Kitchen atorvastatin (LIPITOR) 40 MG tablet Take 0.5 tablets (20 mg total) by mouth daily. (Patient taking differently: Take 20 mg by mouth at bedtime. ) 90 tablet 1  . azelastine (ASTEPRO) 137 MCG/SPRAY nasal spray Place 1 spray into the nose 2 (two) times daily. Use in each nostril as directed (Patient taking differently: Place 1 spray into the nose daily. Use in each nostril as directed) 30 mL 11  . BIOTIN PO Take by mouth. Take 1000mg  by mouth twice daily.    . Black Cohosh (REMIFEMIN) 20 MG TABS Take 20 mg by mouth 2 (two) times daily.     . Calcium Carbonate-Vitamin D (CALCIUM 600 + D PO) Take 1 tablet by mouth daily.    . Cholecalciferol (D3-1000 PO) Take 1,000 Int'l Units by mouth every morning.     . fish oil-omega-3 fatty acids 1000 MG capsule Take 1 capsule by mouth daily.    . fluticasone (FLONASE) 50 MCG/ACT nasal spray Place 1 spray into both  nostrils daily. 48 g 1  . GLIPIZIDE XL 10 MG 24 hr tablet TAKE 1 TABLET EVERY DAY 90 tablet 1  . Lancet Devices (ACCU-CHEK SOFTCLIX) lancets Use to test blood sugars 1 -2 times daily 1 each 3  . lisinopril (PRINIVIL,ZESTRIL) 20 MG tablet TAKE 1 TABLET EVERY DAY 90 tablet 1  . Magnesium Oxide -Mg Supplement 250 MG TABS Take 250 mg by mouth daily.    . Meclizine HCl 25 MG CHEW Chew 25 mg by mouth daily as needed (for vertigo).     Marland Kitchen metformin (FORTAMET) 500 MG (OSM) 24 hr tablet TAKE 2 TABLETS TWICE DAILY WITH MEALS 360 tablet 1  . Multiple Vitamin (MULTIVITAMIN) tablet Take 1 tablet by mouth daily.    Marland Kitchen omeprazole (PRILOSEC) 20 MG capsule TAKE 1 CAPSULE EVERY DAY 90 capsule 1  . Potassium 99 MG TABS Take 1 tablet by mouth daily.    Marland Kitchen senna-docusate (SENOKOT-S) 8.6-50 MG per tablet Take 1 tablet by mouth daily. 30 tablet 0  . sertraline (ZOLOFT) 50 MG tablet TAKE 1 TABLET EVERY DAY 90 tablet 2  . sitaGLIPtin (JANUVIA) 25 MG tablet Take 1 tablet (25 mg total) by mouth daily. 90 tablet 1  . verapamil (VERELAN PM) 180 MG 24 hr capsule TAKE 1 CAPSULE EVERY DAY 90 capsule 1  . azithromycin (ZITHROMAX)  250 MG tablet Take 2 tablets by mouth on day 1, take 1 tablet by mouth each day after for 4 days. (Patient not taking: Reported on 03/03/2015) 6 each 0  . ondansetron (ZOFRAN-ODT) 4 MG disintegrating tablet Take 4 mg by mouth every 8 (eight) hours as needed. Nausea and vomiting    . naproxen (NAPROSYN) 500 MG tablet Take 500 mg by mouth 2 (two) times daily as needed. For shoulder pain.     No facility-administered medications prior to visit.    Review of Systems;  Patient denies headache, fevers, malaise, unintentional weight loss, skin rash, eye pain, sinus congestion and sinus pain, sore throat, dysphagia,  hemoptysis , cough, dyspnea, wheezing, chest pain, palpitations, orthopnea, edema, abdominal pain, nausea, melena, diarrhea, constipation, flank pain, dysuria, hematuria, urinary  Frequency,  nocturia, numbness, tingling, seizures,  Focal weakness, Loss of consciousness,  Tremor, insomnia, depression, anxiety, and suicidal ideation.      Objective:  BP 116/72 mmHg  Pulse 67  Temp(Src) 97.8 F (36.6 C)  Resp 16  Ht 5\' 1"  (1.549 m)  Wt 178 lb 6.4 oz (80.922 kg)  BMI 33.73 kg/m2  SpO2 96%  BP Readings from Last 3 Encounters:  03/03/15 116/72  02/24/15 131/75  02/10/15 118/70    Wt Readings from Last 3 Encounters:  03/03/15 178 lb 6.4 oz (80.922 kg)  02/24/15 183 lb 12.8 oz (83.371 kg)  02/10/15 182 lb 6.4 oz (82.736 kg)    General appearance: alert, cooperative and appears stated age Breast: left nipple pigmentation changes  Thickening of skin Lungs: clear to auscultation bilaterally Heart: regular rate and rhythm, S1, S2 normal, no murmur, click, rub or gallop Abdomen: soft, non-tender; bowel sounds normal; no masses,  no organomegaly Pulses: 2+ and symmetric Skin: Skin color, texture, turgor normal. No rashes or lesions Lymph nodes: Cervical, supraclavicular, and axillary nodes normal.  Lab Results  Component Value Date   HGBA1C 6.7* 02/16/2015   HGBA1C 6.9* 11/07/2014   HGBA1C 7.7* 08/07/2014    Lab Results  Component Value Date   CREATININE 0.94 02/24/2015   CREATININE 0.91 02/23/2015   CREATININE 0.88 02/22/2015    Lab Results  Component Value Date   WBC 5.0 02/24/2015   HGB 11.3* 02/24/2015   HCT 34.6* 02/24/2015   PLT 281 02/24/2015   GLUCOSE 126* 02/24/2015   CHOL 199 02/16/2015   TRIG 96.0 02/16/2015   HDL 77.50 11/07/2014   LDLDIRECT 98.7 07/15/2013   LDLCALC 76 11/07/2014   ALT 187* 02/20/2015   AST 60* 02/20/2015   NA 140 02/24/2015   K 3.8 02/24/2015   CL 106 02/24/2015   CREATININE 0.94 02/24/2015   BUN 9 02/24/2015   CO2 27 02/24/2015   TSH 2.69 11/07/2014   INR 1.1 01/23/2009   HGBA1C 6.7* 02/16/2015   MICROALBUR 0.7 02/16/2015    Ct Abdomen Pelvis W Contrast  02/21/2015   CLINICAL DATA:  Rotator cuff surgery on  Tuesday, now feeling constipated with vomiting.  EXAM: CT ABDOMEN AND PELVIS WITH CONTRAST  TECHNIQUE: Multidetector CT imaging of the abdomen and pelvis was performed using the standard protocol following bolus administration of intravenous contrast.  CONTRAST:  115mL OMNIPAQUE IOHEXOL 300 MG/ML  SOLN  COMPARISON:  None.  FINDINGS: BODY WALL: No contributory findings.  LOWER CHEST: Bibasilar atelectasis.  Small hiatal hernia with large gastroesophageal reflux.  ABDOMEN/PELVIS:  Liver: No focal abnormality.  Biliary: Cholecystectomy.  No bile duct enlargement  Pancreas: Unremarkable.  Spleen: Unremarkable.  Adrenals: Unremarkable.  Kidneys and ureters: No hydronephrosis or stone.  Bladder: Unremarkable.  Reproductive: 3.4 cm simple appearing cyst in the left ovary.  Bowel: There is dilated fluid-filled proximal small bowel which gradually decreases in caliber without a discrete transition point. The colon is not decompressed, but rather diffusely contains stool. No indication of bowel necrosis or perforation. There is only mild interloop fluid in the central mesenteries. Normal appendix.  Retroperitoneum: No mass or adenopathy.  Peritoneum: No ascites or pneumoperitoneum.  Vascular: No acute abnormality.  OSSEOUS: No acute abnormalities.  IMPRESSION: 1. Dilated small bowel. Lack of transition point favors reactive ileus over partial obstruction. 2. 34 mm left ovarian cyst. Recommend 1 year followup pelvic ultrasound.   Electronically Signed   By: Monte Fantasia M.D.   On: 02/21/2015 04:18    Assessment & Plan:   Problem List Items Addressed This Visit    DM (diabetes mellitus), type 2, uncontrolled, with renal complications    .Control has improved and  now  at goal with addition of Januvia 25 mg daily.  Referral to Dr Rolly Salter for GFR < 50.   Lab Results  Component Value Date   HGBA1C 6.7* 02/16/2015   Lab Results  Component Value Date   MICROALBUR 0.7 02/16/2015   Lab Results  Component Value  Date   CHOL 199 02/16/2015   HDL 77.50 11/07/2014   LDLCALC 76 11/07/2014   LDLDIRECT 98.7 07/15/2013   TRIG 96.0 02/16/2015   CHOLHDL 2 11/07/2014               Mass of left breast    She has had persistent nipple pain on the left and pruritus . The nipple pigmentation pattern also appears to have changed.  Diagnostic mammogram ordered       Other Visit Diagnoses    Breast mass    -  Primary    Relevant Orders    MM Digital Diagnostic Unilat L       I have discontinued Ms. Huskins's naproxen. I am also having her maintain her Cholecalciferol (D3-1000 PO), BIOTIN PO, multivitamin, Calcium Carbonate-Vitamin D (CALCIUM 600 + D PO), Potassium, fish oil-omega-3 fatty acids, Black Cohosh, Meclizine HCl, albuterol, azelastine, fluticasone, accu-chek softclix, sitaGLIPtin, atorvastatin, metformin, omeprazole, verapamil, GLIPIZIDE XL, sertraline, lisinopril, azithromycin, aspirin EC, ondansetron, ACCU-CHEK AVIVA PLUS, Magnesium Oxide -Mg Supplement, and senna-docusate.  No orders of the defined types were placed in this encounter.    Medications Discontinued During This Encounter  Medication Reason  . naproxen (NAPROSYN) 500 MG tablet     Follow-up: Return in about 3 months (around 06/03/2015) for follow up diabetes.   Crecencio Mc, MD

## 2015-03-04 NOTE — Assessment & Plan Note (Signed)
.  Control has improved and  now  at goal with addition of Januvia 25 mg daily.  Referral to Dr Rolly Salter for GFR < 50.   Lab Results  Component Value Date   HGBA1C 6.7* 02/16/2015   Lab Results  Component Value Date   MICROALBUR 0.7 02/16/2015   Lab Results  Component Value Date   CHOL 199 02/16/2015   HDL 77.50 11/07/2014   LDLCALC 76 11/07/2014   LDLDIRECT 98.7 07/15/2013   TRIG 96.0 02/16/2015   CHOLHDL 2 11/07/2014

## 2015-03-04 NOTE — Assessment & Plan Note (Signed)
Secondary to narcotics.  Now resolved.

## 2015-03-05 ENCOUNTER — Ambulatory Visit: Payer: Medicare PPO

## 2015-03-05 DIAGNOSIS — R29898 Other symptoms and signs involving the musculoskeletal system: Secondary | ICD-10-CM | POA: Diagnosis not present

## 2015-03-05 DIAGNOSIS — M25511 Pain in right shoulder: Secondary | ICD-10-CM | POA: Diagnosis not present

## 2015-03-05 DIAGNOSIS — Z9889 Other specified postprocedural states: Secondary | ICD-10-CM

## 2015-03-05 DIAGNOSIS — Z4789 Encounter for other orthopedic aftercare: Secondary | ICD-10-CM

## 2015-03-05 NOTE — Therapy (Signed)
[facility] [address] [place], [address] Phone: [phone]   Fax:  [phone]  Physical Therapy Treatment  Patient Details  Name: Sandra Hendrix MRN: [id number] Date of Birth: July 08, 1946 Referring Provider:  Justice Britain, MD  Encounter Date: 03/05/2015      PT End of Session - 03/05/15 0907    Visit Number 3   Number of Visits 17   Date for PT Re-Evaluation 04/23/15   Authorization Type 3   Authorization Time Period 10   PT Start Time 0907   PT Stop Time 1004   PT Time Calculation (min) 57 min   Activity Tolerance Patient tolerated treatment well;Patient limited by pain   Behavior During Therapy [facility] for tasks assessed/performed      Past Medical History  Diagnosis Date  . Diabetes mellitus without complication   . Hyperlipidemia   . Hypertension   . Arthritis   . Depression   . GERD (gastroesophageal reflux disease)   . Allergy   . Urinary incontinence   . Varicose veins     Past Surgical History  Procedure Laterality Date  . Joint replacement Right 2007  . Joint replacement Left 2004  . Rotator cuff repair Right     There were no vitals filed for this visit.  Visit Diagnosis:  S/P rotator cuff repair  Orthopedic aftercare  Pain in joint, shoulder region, right  Weakness of shoulder      Subjective Assessment - 03/05/15 1012    Subjective Patient states 2/10 R shoulder pain currently. Feels a burning sensation behind L shoulder blade.    Patient Stated Goals Wants to be able to go to [facility] July 1 - 11, 2016 with her 2 grandchildren to visit her sister and her sister's grand children. To be able to use her R shoulder with not pain.    Currently in Pain? Yes   Pain Score 2    Pain Location Shoulder   Pain Orientation Right   Pain Descriptors / Indicators Aching   Pain Type Surgical pain           [facility] Adult PT Treatment/Exercise - 03/05/15 0954    Shoulder  Exercises: Supine   Other Supine Exercises Directed patient with supine R elbow flexion 10x3 with R arm in neutral.    Shoulder Exercises: Seated   Other Seated Exercises Directed patient with seated bilateral scapular retraction 10x2, seated L scapular retraction with emphasis on L lower trapezius muscle 10x3   Cryotherapy   Number Minutes Cryotherapy 15 Minutes   Cryotherapy Location Shoulder  Right, sitting with sling on   Type of Cryotherapy Ice pack   Manual Therapy   Manual therapy comments Supine PROM R shoulder abduction, scaption, ER ( in scapular plane and in neutral), flexion. PROM not performed past 90 degrees abduction, flexion or scaption, ER not performed past 30 degrees. Seated soft tissue mobilization to L rhomboid major.             PT Education - 03/05/15 1015    Education provided Yes   Education Details ther-ex, HEP   Person(s) Educated Patient   Methods Explanation;Demonstration;Tactile cues;Verbal cues   Comprehension Verbalized understanding;Returned demonstration             PT Long Term Goals - 02/26/15 1907    PT LONG TERM GOAL #1   Title Patient will be independent with her HEP to improve PROM, A/AROM abd ability to raise her  R arm when appropriate.    Time 12   Period Weeks   Status New   PT LONG TERM GOAL #2   Title Patient will improve her Quick Dash score by at least 14% as a demonstration of improved function.   Time 12   Period Weeks   Status New   PT LONG TERM GOAL #3   Title Patient will improve R shoulder flexion, abduction PROM to at least 125 degrees to promote ability to raise her R arm.    Time 12   Period Weeks   Status New   PT LONG TERM GOAL #4   Title Patient will improve R shoulder flexion and abduction AAROM to at least 110 degrees to promote ability to raise her R arm when appropriate.    Time 12   Period Weeks   Status New   PT LONG TERM GOAL #5   Title Patient will improve R shouler ER to 60 degrees AROM and IR to  at least 60 degrees AROM when appropriate to promote ability to groom, and don and doff clothes.    Time 12   Period Weeks   Status New            Plan - 03/05/15 1015    Clinical Impression Statement Patient is currently 2 weeks post op. Tolerated session well without aggravation of R shoulder pain. Tolerates PROM R shoulder movements in scapular plane the best. Decreasted L scapular muscle burning sensation after manual therapy to L rhomboids. Manual therapy to L shoulder blade and L scapular exercises  performed to improve ability to use L UE for daily tasks to help protect R shoulder during healing and recovery process.    Pt will benefit from skilled therapeutic intervention in order to improve on the following deficits Decreased range of motion;Pain;Improper body mechanics;Decreased strength   Rehab Potential Good   PT Frequency 2x / week   PT Duration 12 weeks   PT Treatment/Interventions Aquatic Therapy;Ultrasound;Neuromuscular re-education;Patient/family education;Passive range of motion;Cryotherapy;Electrical Stimulation;Therapeutic activities;Iontophoresis 4mg /ml Dexamethasone;Moist Heat;Therapeutic exercise;Manual techniques   PT Next Visit Plan gentle PROM not past 90 degrees flexion or abduction, gentle ER   Consulted and Agree with Plan of Care Patient        Problem List Patient Active Problem List   Diagnosis Date Noted  . Mass of left breast 03/04/2015  . Ileus, postoperative 02/21/2015  . CKD (chronic kidney disease) stage 3, GFR 30-59 ml/min 11/07/2014  . UTI (urinary tract infection) 02/08/2014  . Acid reflux 12/26/2013  . Irritable bowel syndrome with constipation 12/26/2013  . Welcome to Medicare preventive visit 10/21/2013  . Hyperlipidemia LDL goal <100 07/15/2013  . Obesity 07/15/2013  . Chronic venous insufficiency 03/21/2013  . DM (diabetes mellitus), type 2, uncontrolled, with renal complications 25/01/3975  . Lack of libido 11/05/2012  . Barrett's  esophagus 11/05/2012  . Hiatal hernia 11/05/2012  . Essential hypertension 11/05/2012  . Urinary incontinence     Tiaria Biby 03/05/2015, 10:56 AM  [facility] [address] [place], [address] Phone: [phone]   Fax:  [phone]

## 2015-03-05 NOTE — Patient Instructions (Signed)
Gave seated L scapular retraction (placing inferior angle of L shoulder blade to R back pants pocket) as part of her HEP 10x3 daily. Patient demonstrated and verbalized understanding.    Improved exercise technique, movement at target joints, use of target muscles after mod verbal, visual, tactile cues.

## 2015-03-09 ENCOUNTER — Ambulatory Visit: Payer: Medicare PPO

## 2015-03-09 DIAGNOSIS — M25511 Pain in right shoulder: Secondary | ICD-10-CM | POA: Diagnosis not present

## 2015-03-09 DIAGNOSIS — R29898 Other symptoms and signs involving the musculoskeletal system: Secondary | ICD-10-CM | POA: Diagnosis not present

## 2015-03-09 DIAGNOSIS — Z4789 Encounter for other orthopedic aftercare: Secondary | ICD-10-CM

## 2015-03-09 DIAGNOSIS — Z9889 Other specified postprocedural states: Secondary | ICD-10-CM

## 2015-03-09 NOTE — Patient Instructions (Signed)
Improved exercise technique (supine R elbow flexion with arm propped into neutral, seated bilateral mini scapular retraction with R shoulder in sling), R shoulder and AC joint protection, movement at target areas, use of target muscles, relaxing R UE during manual techniques after mod verbal, visual, tactile cues.

## 2015-03-09 NOTE — Therapy (Signed)
[facility] [address], [place], [address] Phone: [phone]   Fax:  [phone]  Physical Therapy Treatment  Patient Details  Name: Sandra Hendrix MRN: [id number] Date of Birth: 27-Jul-1946 Referring Provider:  Justice Britain, MD  Encounter Date: 03/09/2015      PT End of Session - 03/09/15 0930    Visit Number 4   Number of Visits 17   Date for PT Re-Evaluation 04/23/15   Authorization Type 4   Authorization Time Period 10   PT Start Time 0910   PT Stop Time 0955   PT Time Calculation (min) 45 min   Activity Tolerance Patient tolerated treatment well;Patient limited by pain   Behavior During Therapy [facility] for tasks assessed/performed      Past Medical History  Diagnosis Date  . Diabetes mellitus without complication   . Hyperlipidemia   . Hypertension   . Arthritis   . Depression   . GERD (gastroesophageal reflux disease)   . Allergy   . Urinary incontinence   . Varicose veins     Past Surgical History  Procedure Laterality Date  . Joint replacement Right 2007  . Joint replacement Left 2004  . Rotator cuff repair Right     There were no vitals filed for this visit.  Visit Diagnosis:  S/P rotator cuff repair  Orthopedic aftercare  Pain in joint, shoulder region, right  Weakness of shoulder      Subjective Assessment - 03/09/15 0930    Subjective 2/10 R shoulder pain currently.    Patient Stated Goals Wants to be able to go to [facility] July 1 - 11, 2016 with her 2 grandchildren to visit her sister and her sister's grand children. To be able to use her R shoulder with no pain.    Currently in Pain? Yes   Pain Score 2    Pain Location Shoulder   Pain Orientation Right   Pain Type Surgical pain   Multiple Pain Sites No          [facility] Adult PT Treatment/Exercise - 03/09/15 0942    Shoulder Exercises: Supine   Other Supine Exercises Directed patient with supine R elbow  flexion 10x3 with R arm in neutral.    Shoulder Exercises: Seated   Other Seated Exercises Directed patient with seated gentle bilateral scapular retraction 10x3 (R shoulder supported in sling)   Cryotherapy   Number Minutes Cryotherapy 15 Minutes   Cryotherapy Location Shoulder  Right, sitting with sling on   Type of Cryotherapy Ice pack   Manual Therapy   Manual therapy comments Supine PROM R shoulder abduction, scaption, ER ( in scapular plane and in neutral), flexion. PROM not performed past 90 degrees abduction, flexion or scaption; ER not performed past 30 degrees.              PT Education - 03/09/15 1924    Education provided Yes   Education Details ther-ex, R UE relaxation during manual techniques PROM.    Person(s) Educated Patient   Methods Explanation;Tactile cues;Verbal cues   Comprehension Returned demonstration;Verbalized understanding             PT Long Term Goals - 02/26/15 1907    PT LONG TERM GOAL #1   Title Patient will be independent with her HEP to improve PROM, A/AROM abd ability to raise her R arm when appropriate.    Time 12   Period Weeks   Status New  PT LONG TERM GOAL #2   Title Patient will improve her Quick Dash score by at least 14% as a demonstration of improved function.   Time 12   Period Weeks   Status New   PT LONG TERM GOAL #3   Title Patient will improve R shoulder flexion, abduction PROM to at least 125 degrees to promote ability to raise her R arm.    Time 12   Period Weeks   Status New   PT LONG TERM GOAL #4   Title Patient will improve R shoulder flexion and abduction AAROM to at least 110 degrees to promote ability to raise her R arm when appropriate.    Time 12   Period Weeks   Status New   PT LONG TERM GOAL #5   Title Patient will improve R shouler ER to 60 degrees AROM and IR to at least 60 degrees AROM when appropriate to promote ability to groom, and don and doff clothes.    Time 12   Period Weeks   Status New                Plan - 03/09/15 1925    Clinical Impression Statement Patient arrived late for session. Patient currently about 3 weeks post op. Tolerated session without aggravation of R shoulder pain. No complain of L scapular burning sensation throughout session.    Pt will benefit from skilled therapeutic intervention in order to improve on the following deficits Decreased range of motion;Pain;Improper body mechanics;Decreased strength   Rehab Potential Good   PT Frequency 2x / week   PT Duration 12 weeks   PT Treatment/Interventions Aquatic Therapy;Ultrasound;Neuromuscular re-education;Patient/family education;Passive range of motion;Cryotherapy;Electrical Stimulation;Therapeutic activities;Iontophoresis 4mg /ml Dexamethasone;Moist Heat;Therapeutic exercise;Manual techniques   PT Next Visit Plan gentle PROM not past 90 degrees flexion or abduction; gentle ER PROM not past 30 degrees   Consulted and Agree with Plan of Care Patient        Problem List Patient Active Problem List   Diagnosis Date Noted  . Mass of left breast 03/04/2015  . Ileus, postoperative 02/21/2015  . CKD (chronic kidney disease) stage 3, GFR 30-59 ml/min 11/07/2014  . UTI (urinary tract infection) 02/08/2014  . Acid reflux 12/26/2013  . Irritable bowel syndrome with constipation 12/26/2013  . Welcome to Medicare preventive visit 10/21/2013  . Hyperlipidemia LDL goal <100 07/15/2013  . Obesity 07/15/2013  . Chronic venous insufficiency 03/21/2013  . DM (diabetes mellitus), type 2, uncontrolled, with renal complications 04/88/8916  . Lack of libido 11/05/2012  . Barrett's esophagus 11/05/2012  . Hiatal hernia 11/05/2012  . Essential hypertension 11/05/2012  . Urinary incontinence     Graciana Sessa 03/09/2015, 7:35 PM  [facility] [address], [place], [address] Phone: [phone]   Fax:  [phone]

## 2015-03-12 ENCOUNTER — Ambulatory Visit: Payer: Medicare PPO

## 2015-03-12 DIAGNOSIS — M25511 Pain in right shoulder: Secondary | ICD-10-CM

## 2015-03-12 DIAGNOSIS — Z9889 Other specified postprocedural states: Secondary | ICD-10-CM

## 2015-03-12 DIAGNOSIS — R29898 Other symptoms and signs involving the musculoskeletal system: Secondary | ICD-10-CM

## 2015-03-12 DIAGNOSIS — Z4789 Encounter for other orthopedic aftercare: Secondary | ICD-10-CM

## 2015-03-12 NOTE — Patient Instructions (Addendum)
Improved joint protection, exercise technique, movement at target joints, use of target muscles after min verbal, visual, tactile cues.

## 2015-03-12 NOTE — Therapy (Signed)
[facility] [address], [place], [address] Phone: [phone]   Fax:  [phone]  Physical Therapy Treatment  Patient Details  Name: Sandra Hendrix MRN: [id number] Date of Birth: 04/15/1946  Referring Provider:  Justice Britain, MD  Encounter Date: 03/12/2015      PT End of Session - 03/12/15 0908    Visit Number 5   Number of Visits 17   Date for PT Re-Evaluation 04/23/15   Authorization Type 5   Authorization Time Period 10   PT Start Time 0908   PT Stop Time 1006   PT Time Calculation (min) 58 min   Activity Tolerance Patient tolerated treatment well;No increased pain   Behavior During Therapy [facility] for tasks assessed/performed      Past Medical History  Diagnosis Date  . Diabetes mellitus without complication   . Hyperlipidemia   . Hypertension   . Arthritis   . Depression   . GERD (gastroesophageal reflux disease)   . Allergy   . Urinary incontinence   . Varicose veins     Past Surgical History  Procedure Laterality Date  . Joint replacement Right 2007  . Joint replacement Left 2004  . Rotator cuff repair Right     There were no vitals filed for this visit.  Visit Diagnosis:  S/P rotator cuff repair  Orthopedic aftercare  Pain in joint, shoulder region, right  Weakness of shoulder      Subjective Assessment - 03/12/15 0910    Subjective 0/10 R shoulder currently.    Patient Stated Goals Wants to be able to go to [facility] July 1 - 11, 2016 with her 2 grandchildren to visit her sister and her sister's grand children. To be able to use her R shoulder with no pain.    Currently in Pain? No/denies   Multiple Pain Sites No            [facility] PT Assessment - 03/12/15 0001    Assessment   Next MD Visit 03/23/2015               [facility] Adult PT Treatment/Exercise - 03/12/15 0956    Shoulder Exercises: Supine   Other Supine Exercises Directed patient with supine R  elbow flexion 10x3 with R arm in neutral.    Shoulder Exercises: Seated   Other Seated Exercises Directed patient with seated gentle bilateral scapular retraction 10x3, and supination/pronation 10x each way (R shoulder supported in sling)   Cryotherapy   Number Minutes Cryotherapy 15 Minutes   Cryotherapy Location Shoulder  Right, sitting with sling on   Type of Cryotherapy Ice pack   Manual Therapy   Manual therapy comments Supine PROM R shoulder abduction, scaption, ER ( in scapular plane and in neutral), flexion. PROM not performed past 90 degrees abduction, flexion or scaption; ER not performed past 30 degrees.                PT Education - 03/12/15 1313    Education provided Yes   Education Details ROM, ther-ex, UE relaxation   Person(s) Educated Patient   Methods Explanation;Tactile cues;Verbal cues   Comprehension Returned demonstration             PT Long Term Goals - 02/26/15 1907    PT LONG TERM GOAL #1   Title Patient will be independent with her HEP to improve PROM, A/AROM abd ability to raise her R arm when appropriate.    Time  12   Period Weeks   Status New   PT LONG TERM GOAL #2   Title Patient will improve her Quick Dash score by at least 14% as a demonstration of improved function.   Time 12   Period Weeks   Status New   PT LONG TERM GOAL #3   Title Patient will improve R shoulder flexion, abduction PROM to at least 125 degrees to promote ability to raise her R arm.    Time 12   Period Weeks   Status New   PT LONG TERM GOAL #4   Title Patient will improve R shoulder flexion and abduction AAROM to at least 110 degrees to promote ability to raise her R arm when appropriate.    Time 12   Period Weeks   Status New   PT LONG TERM GOAL #5   Title Patient will improve R shouler ER to 60 degrees AROM and IR to at least 60 degrees AROM when appropriate to promote ability to groom, and don and doff clothes.    Time 12   Period Weeks   Status New                Plan - 03/12/15 0958    Clinical Impression Statement Good ability to relax R UE today. No complain or R shoulder pain or discomfort throughout session.    Pt will benefit from skilled therapeutic intervention in order to improve on the following deficits Decreased range of motion;Pain;Improper body mechanics;Decreased strength   Rehab Potential Good   PT Frequency 2x / week   PT Duration 12 weeks   PT Treatment/Interventions Aquatic Therapy;Ultrasound;Neuromuscular re-education;Patient/family education;Passive range of motion;Cryotherapy;Electrical Stimulation;Therapeutic activities;Iontophoresis 4mg /ml Dexamethasone;Moist Heat;Therapeutic exercise;Manual techniques   PT Next Visit Plan gentle PROM not past 90 degrees flexion or abduction; gentle ER PROM not past 30 degrees   Consulted and Agree with Plan of Care Patient        Problem List Patient Active Problem List   Diagnosis Date Noted  . Mass of left breast 03/04/2015  . Ileus, postoperative 02/21/2015  . CKD (chronic kidney disease) stage 3, GFR 30-59 ml/min 11/07/2014  . UTI (urinary tract infection) 02/08/2014  . Acid reflux 12/26/2013  . Irritable bowel syndrome with constipation 12/26/2013  . Welcome to Medicare preventive visit 10/21/2013  . Hyperlipidemia LDL goal <100 07/15/2013  . Obesity 07/15/2013  . Chronic venous insufficiency 03/21/2013  . DM (diabetes mellitus), type 2, uncontrolled, with renal complications 18/56/3149  . Lack of libido 11/05/2012  . Barrett's esophagus 11/05/2012  . Hiatal hernia 11/05/2012  . Essential hypertension 11/05/2012  . Urinary incontinence     Jamoni Broadfoot 03/12/2015, 1:20 PM  [facility] [address] [place], [address] Phone: [phone]   Fax:  [phone]

## 2015-03-13 ENCOUNTER — Other Ambulatory Visit: Payer: Self-pay | Admitting: Internal Medicine

## 2015-03-16 ENCOUNTER — Encounter: Payer: Self-pay | Admitting: Internal Medicine

## 2015-03-16 ENCOUNTER — Ambulatory Visit: Payer: Medicare PPO

## 2015-03-16 DIAGNOSIS — R29898 Other symptoms and signs involving the musculoskeletal system: Secondary | ICD-10-CM

## 2015-03-16 DIAGNOSIS — Z4789 Encounter for other orthopedic aftercare: Secondary | ICD-10-CM

## 2015-03-16 DIAGNOSIS — Z9889 Other specified postprocedural states: Secondary | ICD-10-CM

## 2015-03-16 DIAGNOSIS — M25511 Pain in right shoulder: Secondary | ICD-10-CM | POA: Diagnosis not present

## 2015-03-16 NOTE — Therapy (Signed)
[place] [facility] [address], [place], [address] Phone: [phone]   Fax:  [phone]  Physical Therapy Treatment  Patient Details  Name: Sandra Hendrix MRN: [id number] Date of Birth: 1945-10-28 Referring Provider:  Justice Britain, MD  Encounter Date: 03/16/2015      PT End of Session - 03/16/15 0911    Visit Number 6   Number of Visits 17   Authorization Type 6   Authorization Time Period 10   PT Start Time 0910   PT Stop Time 0959   PT Time Calculation (min) 49 min   Activity Tolerance Patient tolerated treatment well;No increased pain   Behavior During Therapy [facility] for tasks assessed/performed      Past Medical History  Diagnosis Date  . Diabetes mellitus without complication   . Hyperlipidemia   . Hypertension   . Arthritis   . Depression   . GERD (gastroesophageal reflux disease)   . Allergy   . Urinary incontinence   . Varicose veins     Past Surgical History  Procedure Laterality Date  . Joint replacement Right 2007  . Joint replacement Left 2004  . Rotator cuff repair Right     There were no vitals filed for this visit.  Visit Diagnosis:  Orthopedic aftercare  S/P rotator cuff repair  Weakness of shoulder      Subjective Assessment - 03/16/15 0948    Subjective Patient states taking a pain medication this morning. No R shoulder pain currently    Patient Stated Goals Wants to be able to go to [facility] July 1 - 11, 2016 with her 2 grandchildren to visit her sister and her sister's grand children. To be able to use her R shoulder with no pain.    Currently in Pain? No/denies   Multiple Pain Sites No                         [facility] Adult PT Treatment/Exercise - 03/16/15 0948    Cryotherapy   Number Minutes Cryotherapy 15 Minutes   Cryotherapy Location Shoulder  Right, sitting with sling on   Type of Cryotherapy Ice pack   Manual Therapy   Manual therapy  comments Supine PROM R shoulder abduction, scaption, ER ( in scapular plane and in neutral), flexion. PROM not performed past 90 degrees abduction, flexion or scaption; ER not performed past 30 degrees. Reviewed progress/current status or R shoulder PROM with pt.                 PT Education - 03/16/15 1254    Education provided Yes   Education Details ROM, Insurance   Person(s) Educated Patient   Methods Explanation   Comprehension Verbalized understanding             PT Long Term Goals - 02/26/15 1907    PT LONG TERM GOAL #1   Title Patient will be independent with her HEP to improve PROM, A/AROM abd ability to raise her R arm when appropriate.    Time 12   Period Weeks   Status New   PT LONG TERM GOAL #2   Title Patient will improve her Quick Dash score by at least 14% as a demonstration of improved function.   Time 12   Period Weeks   Status New   PT LONG TERM GOAL #3   Title Patient will improve R shoulder flexion, abduction PROM to at least 125  degrees to promote ability to raise her R arm.    Time 12   Period Weeks   Status New   PT LONG TERM GOAL #4   Title Patient will improve R shoulder flexion and abduction AAROM to at least 110 degrees to promote ability to raise her R arm when appropriate.    Time 12   Period Weeks   Status New   PT LONG TERM GOAL #5   Title Patient will improve R shouler ER to 60 degrees AROM and IR to at least 60 degrees AROM when appropriate to promote ability to groom, and don and doff clothes.    Time 12   Period Weeks   Status New               Plan - 03/16/15 7290    Clinical Impression Statement Supine R shoulder PROM: abduction 74 degrees, R shoulder ER in neutral shoulder 24 degrees. Improved R shoulder PROM for abduction and ER since initial evaluation. Pt was educated about her 12 visit approved visits until 04/12/2015. Trying to contact insurance to see if more visits will be approved if needed. Pt verbalized  understanding.    Pt will benefit from skilled therapeutic intervention in order to improve on the following deficits Decreased range of motion;Pain;Improper body mechanics;Decreased strength   Rehab Potential Good   PT Frequency 2x / week   PT Duration 12 weeks   PT Treatment/Interventions Aquatic Therapy;Ultrasound;Neuromuscular re-education;Patient/family education;Passive range of motion;Cryotherapy;Electrical Stimulation;Therapeutic activities;Iontophoresis 4mg /ml Dexamethasone;Moist Heat;Therapeutic exercise;Manual techniques   PT Next Visit Plan gentle PROM not past 90 degrees flexion or abduction; gentle ER PROM not past 30 degrees   Consulted and Agree with Plan of Care Patient        Problem List Patient Active Problem List   Diagnosis Date Noted  . Mass of left breast 03/04/2015  . Ileus, postoperative 02/21/2015  . CKD (chronic kidney disease) stage 3, GFR 30-59 ml/min 11/07/2014  . UTI (urinary tract infection) 02/08/2014  . Acid reflux 12/26/2013  . Irritable bowel syndrome with constipation 12/26/2013  . Welcome to Medicare preventive visit 10/21/2013  . Hyperlipidemia LDL goal <100 07/15/2013  . Obesity 07/15/2013  . Chronic venous insufficiency 03/21/2013  . DM (diabetes mellitus), type 2, uncontrolled, with renal complications 21/07/5519  . Lack of libido 11/05/2012  . Barrett's esophagus 11/05/2012  . Hiatal hernia 11/05/2012  . Essential hypertension 11/05/2012  . Urinary incontinence     Ollis Daudelin 03/16/2015, 5:21 PM  [place] [facility] [address], [place], [address] Phone: [phone]   Fax:  [phone]

## 2015-03-19 ENCOUNTER — Ambulatory Visit: Payer: Medicare PPO

## 2015-03-19 DIAGNOSIS — M25511 Pain in right shoulder: Secondary | ICD-10-CM

## 2015-03-19 DIAGNOSIS — Z4789 Encounter for other orthopedic aftercare: Secondary | ICD-10-CM | POA: Diagnosis not present

## 2015-03-19 DIAGNOSIS — R29898 Other symptoms and signs involving the musculoskeletal system: Secondary | ICD-10-CM

## 2015-03-19 DIAGNOSIS — Z9889 Other specified postprocedural states: Secondary | ICD-10-CM

## 2015-03-19 NOTE — Therapy (Signed)
[facility] [place] [facility] [address] [place], [address] Phone: [phone]   Fax:  [phone]  Physical Therapy Treatment  Patient Details  Name: Sandra Hendrix MRN: [id number] Date of Birth: 07-12-1946 Referring Provider:  Justice Britain, MD  Encounter Date: 03/19/2015      PT End of Session - 03/19/15 0912    Visit Number 7   Number of Visits 17   Date for PT Re-Evaluation 04/23/15  [employer] 12 visits until 04/12/15   Authorization Type 7   Authorization Time Period 10   PT Start Time 0910   PT Stop Time 1001   PT Time Calculation (min) 51 min   Activity Tolerance Patient tolerated treatment well;No increased pain   Behavior During Therapy [facility] for tasks assessed/performed      Past Medical History  Diagnosis Date  . Diabetes mellitus without complication   . Hyperlipidemia   . Hypertension   . Arthritis   . Depression   . GERD (gastroesophageal reflux disease)   . Allergy   . Urinary incontinence   . Varicose veins     Past Surgical History  Procedure Laterality Date  . Joint replacement Right 2007  . Joint replacement Left 2004  . Rotator cuff repair Right     There were no vitals filed for this visit.  Visit Diagnosis:  S/P rotator cuff repair  Orthopedic aftercare  Weakness of shoulder  Pain in joint, shoulder region, right      Subjective Assessment - 03/19/15 0913    Subjective Pt goes back to surgeon on 04/22/2015. His PA stated her shoulder is doing well. Can now transition sleeping on bed. Might be able to take her sling off in 6 weeks.    Patient Stated Goals Wants to be able to go to [facility] July 1 - 11, 2016 with her 2 grandchildren to visit her sister and her sister's grand children. To be able to use her R shoulder with no pain.    Currently in Pain? No/denies   Multiple Pain Sites No                         [facility] Adult PT Treatment/Exercise - 03/19/15  1548    Shoulder Exercises: Seated   Other Seated Exercises Directed patient with seated bilateral scap retraction 10x. Pt education on retracting L shoulder blade when she uses her L arm to help prevent L shoulder pain during recovery of her R shoulder from surgery. Pt demonstrated and  verbalized understanding. Also recommended for pt to try a steering wheel knob for her L hand to help her drive with more ease during the recovery of her R shoulder. Pt verbalized understanding.    Cryotherapy   Number Minutes Cryotherapy 15 Minutes   Cryotherapy Location Shoulder  Right, sitting with sling on   Type of Cryotherapy Ice pack   Manual Therapy   Manual therapy comments Supine PROM R shoulder abduction, scaption, ER ( in scapular plane and in neutral), flexion. PROM not performed past 90 degrees abduction, flexion or scaption; ER not performed past 30 degrees.                PT Education - 03/19/15 1851    Education provided Yes   Education Details L scap retraction while using L arm   Person(s) Educated Patient   Methods Explanation;Demonstration;Verbal cues;Tactile cues   Comprehension Verbalized understanding  PT Long Term Goals - 02/26/15 1907    PT LONG TERM GOAL #1   Title Patient will be independent with her HEP to improve PROM, A/AROM abd ability to raise her R arm when appropriate.    Time 12   Period Weeks   Status New   PT LONG TERM GOAL #2   Title Patient will improve her Quick Dash score by at least 14% as a demonstration of improved function.   Time 12   Period Weeks   Status New   PT LONG TERM GOAL #3   Title Patient will improve R shoulder flexion, abduction PROM to at least 125 degrees to promote ability to raise her R arm.    Time 12   Period Weeks   Status New   PT LONG TERM GOAL #4   Title Patient will improve R shoulder flexion and abduction AAROM to at least 110 degrees to promote ability to raise her R arm when appropriate.    Time 12    Period Weeks   Status New   PT LONG TERM GOAL #5   Title Patient will improve R shouler ER to 60 degrees AROM and IR to at least 60 degrees AROM when appropriate to promote ability to groom, and don and doff clothes.    Time 12   Period Weeks   Status New               Plan - 03/19/15 1858    Clinical Impression Statement Tolerated session well without aggravation of symptoms. Imroving ROM upon observation.    Pt will benefit from skilled therapeutic intervention in order to improve on the following deficits Decreased range of motion;Pain;Improper body mechanics;Decreased strength   Rehab Potential Good   PT Frequency 2x / week   PT Duration 12 weeks   PT Treatment/Interventions Aquatic Therapy;Ultrasound;Neuromuscular re-education;Patient/family education;Passive range of motion;Cryotherapy;Electrical Stimulation;Therapeutic activities;Iontophoresis 4mg /ml Dexamethasone;Moist Heat;Therapeutic exercise;Manual techniques   PT Next Visit Plan gentle PROM not past 90 degrees flexion or abduction; gentle ER PROM not past 30 degrees   Consulted and Agree with Plan of Care Patient        Problem List Patient Active Problem List   Diagnosis Date Noted  . Mass of left breast 03/04/2015  . Ileus, postoperative 02/21/2015  . CKD (chronic kidney disease) stage 3, GFR 30-59 ml/min 11/07/2014  . UTI (urinary tract infection) 02/08/2014  . Acid reflux 12/26/2013  . Irritable bowel syndrome with constipation 12/26/2013  . Welcome to Medicare preventive visit 10/21/2013  . Hyperlipidemia LDL goal <100 07/15/2013  . Obesity 07/15/2013  . Chronic venous insufficiency 03/21/2013  . DM (diabetes mellitus), type 2, uncontrolled, with renal complications 97/41/6384  . Lack of libido 11/05/2012  . Barrett's esophagus 11/05/2012  . Hiatal hernia 11/05/2012  . Essential hypertension 11/05/2012  . Urinary incontinence     Ranee Peasley 03/19/2015, 7:06 PM  [place] [facility] [address] [place], [address] Phone: [phone]   Fax:  [phone]

## 2015-03-19 NOTE — Patient Instructions (Signed)
Pt was instructed to retract L scapula when using her L arm to keep her L shoulder from hurting so she can use it while her R shoulder is recovering. Pt verbalized understanding.

## 2015-03-23 ENCOUNTER — Ambulatory Visit: Payer: Medicare PPO

## 2015-03-23 DIAGNOSIS — Z4789 Encounter for other orthopedic aftercare: Secondary | ICD-10-CM

## 2015-03-23 DIAGNOSIS — M25511 Pain in right shoulder: Secondary | ICD-10-CM | POA: Diagnosis not present

## 2015-03-23 DIAGNOSIS — Z9889 Other specified postprocedural states: Secondary | ICD-10-CM

## 2015-03-23 DIAGNOSIS — R29898 Other symptoms and signs involving the musculoskeletal system: Secondary | ICD-10-CM | POA: Diagnosis not present

## 2015-03-23 NOTE — Therapy (Signed)
[place] [facility] [address] [place], [address] Phone: [phone]   Fax:  [phone]  Physical Therapy Treatment  Patient Details  Name: Sandra Hendrix MRN: [id number] Date of Birth: 25-Nov-1945 Referring Provider:  Justice Britain, MD  Encounter Date: 03/23/2015      PT End of Session - 03/23/15 0908    Visit Number 8   Number of Visits 17   Date for PT Re-Evaluation 04/23/15   Authorization Type 8   Authorization Time Period 10   PT Start Time 0908   PT Stop Time 0958   PT Time Calculation (min) 50 min   Activity Tolerance Patient tolerated treatment well;No increased pain   Behavior During Therapy [facility] for tasks assessed/performed      Past Medical History  Diagnosis Date  . Diabetes mellitus without complication   . Hyperlipidemia   . Hypertension   . Arthritis   . Depression   . GERD (gastroesophageal reflux disease)   . Allergy   . Urinary incontinence   . Varicose veins     Past Surgical History  Procedure Laterality Date  . Joint replacement Right 2007  . Joint replacement Left 2004  . Rotator cuff repair Right     There were no vitals filed for this visit.  Visit Diagnosis:  S/P rotator cuff repair  Orthopedic aftercare  Weakness of shoulder      Subjective Assessment - 03/23/15 0944    Subjective No R shoulder pain currently. Did have some discomfort R shoulder when trying to sew some clothes when she was pushing a needle in.    Patient Stated Goals Wants to be able to go to [facility] July 1 - 11, 2016 with her 2 grandchildren to visit her sister and her sister's grand children. To be able to use her R shoulder with no pain.    Currently in Pain? No/denies   Multiple Pain Sites No                         [facility] Adult PT Treatment/Exercise - 03/23/15 2230    Exercises   Other Exercises  Reviewed R elbow flexion, forearm supination/pronation, and bilateral  scapular retraction exercise with pt for her to perform during her trip. Educated pt that performing self R shoulder ROM exercises might be early prior to 6 weeks after surgery. Pt verbalized understanding.    Cryotherapy   Number Minutes Cryotherapy 15 Minutes   Cryotherapy Location Shoulder  Right, sitting with sling on   Type of Cryotherapy Ice pack   Manual Therapy   Manual therapy comments Supine PROM R shoulder abduction, scaption, ER ( in scapular plane and in neutral), flexion. PROM not performed past 90 degrees abduction, flexion or scaption; ER not performed past 30 degrees. PROM performed to promote ROM.                 PT Education - 03/23/15 2235    Education provided Yes   Education Details using R hand for sewing might be early based on her shoulder, and to hold off for now; ther-ex   Person(s) Educated Patient   Methods Explanation   Comprehension Verbalized understanding             PT Long Term Goals - 02/26/15 1907    PT LONG TERM GOAL #1   Title Patient will be independent with her HEP to improve PROM, A/AROM abd ability  to raise her R arm when appropriate.    Time 12   Period Weeks   Status New   PT LONG TERM GOAL #2   Title Patient will improve her Quick Dash score by at least 14% as a demonstration of improved function.   Time 12   Period Weeks   Status New   PT LONG TERM GOAL #3   Title Patient will improve R shoulder flexion, abduction PROM to at least 125 degrees to promote ability to raise her R arm.    Time 12   Period Weeks   Status New   PT LONG TERM GOAL #4   Title Patient will improve R shoulder flexion and abduction AAROM to at least 110 degrees to promote ability to raise her R arm when appropriate.    Time 12   Period Weeks   Status New   PT LONG TERM GOAL #5   Title Patient will improve R shouler ER to 60 degrees AROM and IR to at least 60 degrees AROM when appropriate to promote ability to groom, and don and doff clothes.     Time 12   Period Weeks   Status New               Plan - 03/23/15 2238    Clinical Impression Statement Pt currently 5 weeks post op on 03/24/2015. Pt was observed to perform slight R shoulder forward movement with slight IR when trying to demonstrate what she did when sewing. Pt was educated that using her R UE for sewing might be early at this phase of her recovery. Pt verbalized understanding. Pt tolerated session well without complain of increased R shoulder pain.    Pt will benefit from skilled therapeutic intervention in order to improve on the following deficits Decreased range of motion;Pain;Improper body mechanics;Decreased strength   Rehab Potential Good   PT Frequency 2x / week   PT Duration 12 weeks   PT Treatment/Interventions Aquatic Therapy;Ultrasound;Neuromuscular re-education;Patient/family education;Passive range of motion;Cryotherapy;Electrical Stimulation;Therapeutic activities;Iontophoresis 4mg /ml Dexamethasone;Moist Heat;Therapeutic exercise;Manual techniques   PT Next Visit Plan gentle PROM not past 90 degrees flexion or abduction; gentle ER PROM not past 30 degrees   Consulted and Agree with Plan of Care Patient        Problem List Patient Active Problem List   Diagnosis Date Noted  . Mass of left breast 03/04/2015  . Ileus, postoperative 02/21/2015  . CKD (chronic kidney disease) stage 3, GFR 30-59 ml/min 11/07/2014  . UTI (urinary tract infection) 02/08/2014  . Acid reflux 12/26/2013  . Irritable bowel syndrome with constipation 12/26/2013  . Welcome to Medicare preventive visit 10/21/2013  . Hyperlipidemia LDL goal <100 07/15/2013  . Obesity 07/15/2013  . Chronic venous insufficiency 03/21/2013  . DM (diabetes mellitus), type 2, uncontrolled, with renal complications 41/32/4401  . Lack of libido 11/05/2012  . Barrett's esophagus 11/05/2012  . Hiatal hernia 11/05/2012  . Essential hypertension 11/05/2012  . Urinary incontinence      Jonnathan Birman 03/23/2015, 10:50 PM  [place] [facility] [address], [place], [address] Phone: [phone]   Fax:  [phone]

## 2015-03-23 NOTE — Patient Instructions (Addendum)
Pt was recommended to hold off on sewing for about 5-6 weeks (might be sooner depending on how her shoulder does with therapy) since her shoulder is still healing. Pt verbalized understanding.

## 2015-03-24 ENCOUNTER — Encounter: Payer: Self-pay | Admitting: Internal Medicine

## 2015-03-24 DIAGNOSIS — N644 Mastodynia: Secondary | ICD-10-CM | POA: Diagnosis not present

## 2015-03-24 DIAGNOSIS — R922 Inconclusive mammogram: Secondary | ICD-10-CM | POA: Diagnosis not present

## 2015-03-24 LAB — HM MAMMOGRAPHY

## 2015-03-26 ENCOUNTER — Ambulatory Visit: Payer: Medicare PPO

## 2015-03-26 DIAGNOSIS — M25511 Pain in right shoulder: Secondary | ICD-10-CM | POA: Diagnosis not present

## 2015-03-26 DIAGNOSIS — Z9889 Other specified postprocedural states: Secondary | ICD-10-CM

## 2015-03-26 DIAGNOSIS — R29898 Other symptoms and signs involving the musculoskeletal system: Secondary | ICD-10-CM | POA: Diagnosis not present

## 2015-03-26 DIAGNOSIS — Z4789 Encounter for other orthopedic aftercare: Secondary | ICD-10-CM | POA: Diagnosis not present

## 2015-03-26 NOTE — Therapy (Signed)
[place] [facility] [address] [place], [address] Phone: [phone]   Fax:  [phone]  Physical Therapy Treatment  Patient Details  Name: Sandra Hendrix MRN: [id number] Date of Birth: [birth year] Referring Provider:  Justice Britain, MD  Encounter Date: 03/26/2015      PT End of Session - 03/26/15 0913    Visit Number 9   Number of Visits 17   Date for PT Re-Evaluation 04/23/15   Authorization Type 9   Authorization Time Period 10   PT Start Time 0913   PT Stop Time 0959   PT Time Calculation (min) 46 min   Activity Tolerance Patient tolerated treatment well;No increased pain   Behavior During Therapy [facility] for tasks assessed/performed      Past Medical History  Diagnosis Date  . Diabetes mellitus without complication   . Hyperlipidemia   . Hypertension   . Arthritis   . Depression   . GERD (gastroesophageal reflux disease)   . Allergy   . Urinary incontinence   . Varicose veins     Past Surgical History  Procedure Laterality Date  . Joint replacement Right 2007  . Joint replacement Left 2004  . Rotator cuff repair Right     There were no vitals filed for this visit.  Visit Diagnosis:  S/P rotator cuff repair  Orthopedic aftercare  Weakness of shoulder  Pain in joint, shoulder region, right                       [facility] Adult PT Treatment/Exercise - 03/26/15 0947    Cryotherapy   Number Minutes Cryotherapy 15 Minutes   Cryotherapy Location Shoulder  Right, sitting with sling on   Type of Cryotherapy Ice pack   Manual Therapy   Manual therapy comments Supine PROM R shoulder flexion, abduction, scaption (not past 90 degrees); supine R shoulder ER both in neutral abduction and in scaption (not past 30 degrees for now).                 PT Education - 03/26/15 1000    Education provided Yes   Education Details HEP   Person(s) Educated Patient   Methods  Explanation;Demonstration   Comprehension Verbalized understanding             PT Long Term Goals - 02/26/15 1907    PT LONG TERM GOAL #1   Title Patient will be independent with her HEP to improve PROM, A/AROM abd ability to raise her R arm when appropriate.    Time 12   Period Weeks   Status New   PT LONG TERM GOAL #2   Title Patient will improve her Quick Dash score by at least 14% as a demonstration of improved function.   Time 12   Period Weeks   Status New   PT LONG TERM GOAL #3   Title Patient will improve R shoulder flexion, abduction PROM to at least 125 degrees to promote ability to raise her R arm.    Time 12   Period Weeks   Status New   PT LONG TERM GOAL #4   Title Patient will improve R shoulder flexion and abduction AAROM to at least 110 degrees to promote ability to raise her R arm when appropriate.    Time 12   Period Weeks   Status New   PT LONG TERM GOAL #5   Title Patient will improve R shouler  ER to 60 degrees AROM and IR to at least 60 degrees AROM when appropriate to promote ability to groom, and don and doff clothes.    Time 12   Period Weeks   Status New               Plan - 03/26/15 0947    Clinical Impression Statement Good R shoulder mobility palpated during PROM. No complain of pain. Pt making good progress towards goals.    Pt will benefit from skilled therapeutic intervention in order to improve on the following deficits Decreased range of motion;Pain;Improper body mechanics;Decreased strength   Rehab Potential Good   PT Frequency 2x / week   PT Duration 12 weeks   PT Treatment/Interventions Aquatic Therapy;Ultrasound;Neuromuscular re-education;Patient/family education;Passive range of motion;Cryotherapy;Electrical Stimulation;Therapeutic activities;Iontophoresis 4mg /ml Dexamethasone;Moist Heat;Therapeutic exercise;Manual techniques   PT Next Visit Plan gentle PROM not past 90 degrees flexion or abduction; gentle ER PROM not past 30  degrees   Consulted and Agree with Plan of Care Patient        Problem List Patient Active Problem List   Diagnosis Date Noted  . Mass of left breast 03/04/2015  . Ileus, postoperative 02/21/2015  . CKD (chronic kidney disease) stage 3, GFR 30-59 ml/min 11/07/2014  . UTI (urinary tract infection) 02/08/2014  . Acid reflux 12/26/2013  . Irritable bowel syndrome with constipation 12/26/2013  . Welcome to Medicare preventive visit 10/21/2013  . Hyperlipidemia LDL goal <100 07/15/2013  . Obesity 07/15/2013  . Chronic venous insufficiency 03/21/2013  . DM (diabetes mellitus), type 2, uncontrolled, with renal complications 03/25/1600  . Lack of libido 11/05/2012  . Barrett's esophagus 11/05/2012  . Hiatal hernia 11/05/2012  . Essential hypertension 11/05/2012  . Urinary incontinence     Volanda Mangine 03/26/2015, 10:06 AM  [place] [facility] [address] [place], [address] Phone: [phone]   Fax:  [phone]

## 2015-03-26 NOTE — Patient Instructions (Addendum)
Improved exercise technique, movement at target joints, use of target muscles after mod verbal, visual, tactile cues.    Pt was recommended to continue her R elbow flexion/extension (arm supported), forearm supination/pronation and hand squeeze exercises while on her trip. No active movements with R shoulder yet. Patient verbalized understanding.

## 2015-04-09 ENCOUNTER — Ambulatory Visit: Payer: Medicare PPO | Attending: Orthopedic Surgery

## 2015-04-09 DIAGNOSIS — Z9889 Other specified postprocedural states: Secondary | ICD-10-CM | POA: Insufficient documentation

## 2015-04-09 DIAGNOSIS — I10 Essential (primary) hypertension: Secondary | ICD-10-CM | POA: Diagnosis not present

## 2015-04-09 DIAGNOSIS — N183 Chronic kidney disease, stage 3 (moderate): Secondary | ICD-10-CM | POA: Diagnosis not present

## 2015-04-09 DIAGNOSIS — Z4789 Encounter for other orthopedic aftercare: Secondary | ICD-10-CM

## 2015-04-09 DIAGNOSIS — E1140 Type 2 diabetes mellitus with diabetic neuropathy, unspecified: Secondary | ICD-10-CM | POA: Diagnosis not present

## 2015-04-09 DIAGNOSIS — R600 Localized edema: Secondary | ICD-10-CM | POA: Diagnosis not present

## 2015-04-09 DIAGNOSIS — R29898 Other symptoms and signs involving the musculoskeletal system: Secondary | ICD-10-CM | POA: Diagnosis not present

## 2015-04-09 DIAGNOSIS — M25511 Pain in right shoulder: Secondary | ICD-10-CM | POA: Diagnosis not present

## 2015-04-09 NOTE — Therapy (Signed)
[place] [facility] [address], [place], [address] Phone: [phone]   Fax:  [phone]  Physical Therapy Treatment and  Progress Report  Patient Details  Name: Sandra Hendrix MRN: [id number] Date of Birth: [birth year] Referring Provider:  Justice Britain, MD  Encounter Date: 04/09/2015      PT End of Session - 04/09/15 0957    Visit Number 10   Number of Visits 17   Date for PT Re-Evaluation 04/23/15   Authorization Type 1   Authorization Time Period 10   PT Start Time 0955   PT Stop Time 1103   PT Time Calculation (min) 68 min   Activity Tolerance Patient tolerated treatment well;No increased pain   Behavior During Therapy [facility] for tasks assessed/performed      Past Medical History  Diagnosis Date  . Diabetes mellitus without complication   . Hyperlipidemia   . Hypertension   . Arthritis   . Depression   . GERD (gastroesophageal reflux disease)   . Allergy   . Urinary incontinence   . Varicose veins     Past Surgical History  Procedure Laterality Date  . Joint replacement Right 2007  . Joint replacement Left 2004  . Rotator cuff repair Right     There were no vitals filed for this visit.  Visit Diagnosis:  S/P rotator cuff repair  Orthopedic aftercare  Weakness of shoulder  Pain in joint, shoulder region, right      Subjective Assessment - 04/09/15 1014    Subjective Pt states having some shoulder soreness during the trip.    Patient Stated Goals To be able to use her R shoulder with no pain.    Currently in Pain? No/denies   Multiple Pain Sites No            [facility] PT Assessment - 04/09/15 1017    Observation/Other Assessments   Quick DASH  41%   PROM   Overall AAROM Comments R shoulder AAROM supine flexion 148 degrees, supine R shoulder ER (neutral arm) 40 degrees, supine AAROM abduction 95 degrees           [facility] Adult PT Treatment/Exercise - 04/09/15 1015    Exercises   Other Exercises  AAROM R shoulder flexion,in supine by PT 10x, then AAROM self 10x2, AAROM ER in supine with UE ranger 5x6; seated  table slides: scaption 10x3, flexion 10x3; seated bilateral scapular retraction with bilateral arms on on table 10x3 with 5 second holds. Gave supine AAROM R shoulder flexion, ER (neutral arm), seated table slides scaption given as part of her HEP. Pt demonstrated and verbalized understanding.    Cryotherapy   Number Minutes Cryotherapy 15 Minutes   Cryotherapy Location Shoulder  Right, sitting with sling on   Type of Cryotherapy Ice pack          PT Education - 04/09/15 1316    Education provided Yes   Education Details ther-ex and purpose of exercise, HEP   Person(s) Educated Patient   Methods Explanation;Demonstration;Tactile cues;Verbal cues;Handout   Comprehension Verbalized understanding;Returned demonstration           PT Long Term Goals - 04/09/15 1326    PT LONG TERM GOAL #1   Title Patient will be independent with her HEP to improve PROM, A/AROM abd ability to raise her R arm when appropriate.    Time 12   Period Weeks   Status On-going   PT LONG TERM GOAL #2  Title Patient will improve her Quick Dash score by at least 14% as a demonstration of improved function.   Time 12   Period Weeks   Status Achieved   PT LONG TERM GOAL #3   Title Patient will improve R shoulder flexion, abduction PROM to at least 125 degrees to promote ability to raise her R arm.    Time 12   Period Weeks   Status Partially Met   PT LONG TERM GOAL #4   Title Patient will improve R shoulder flexion and abduction AAROM to at least 110 degrees to promote ability to raise her R arm when appropriate.    Time 12   Period Weeks   Status Partially Met   PT LONG TERM GOAL #5   Title Patient will improve R shouler ER to 60 degrees AROM and IR to at least 60 degrees AROM when appropriate to promote ability to groom, and don and doff clothes.    Time 12    Period Weeks   Status On-going   Additional Long Term Goals   Additional Long Term Goals Yes   PT LONG TERM GOAL #6   Title Patient will improve her Quick Dash score to 27% or less as a demonstration of improved function.   Time 8   Period Weeks   Status New           Plan - 05-01-2015 1320    Clinical Impression Statement Patient is currently 7 weeks post surgery. Just initiated AAROM today which patient tolerated very well with very good AAROM. Improving scapular retraction as well. Tolerated session well without aggravation of symptoms. Patient also improved her Quick Dash score by about 47.6% suggesting improved function.  Patient will benefit from continued skilled physical therapy services to promote AAROM and ability to use her R UE when appropriate.    Pt will benefit from skilled therapeutic intervention in order to improve on the following deficits Decreased range of motion;Pain;Improper body mechanics;Decreased strength   Rehab Potential Good   PT Frequency 2x / week   PT Duration 8 weeks   PT Treatment/Interventions Aquatic Therapy;Ultrasound;Neuromuscular re-education;Patient/family education;Passive range of motion;Cryotherapy;Electrical Stimulation;Therapeutic activities;Iontophoresis 65m/ml Dexamethasone;Moist Heat;Therapeutic exercise;Manual techniques   PT Next Visit Plan AAROM, scapular control, function, manual therapy   Consulted and Agree with Plan of Care Patient          G-Codes - [id number]    Functional Assessment Tool Used Quick Dash   Functional Limitation Carrying, moving and handling objects   Carrying, Moving and Handling Objects Current Status [phone] At least 40 percent but less than 60 percent impaired, limited or restricted   Carrying, Moving and Handling Objects Goal Status (([id number] At least 20 percent but less than 40 percent impaired, limited or restricted      Problem List Patient Active Problem List   Diagnosis Date Noted  . Mass of left  breast 03/04/2015  . Ileus, postoperative 02/21/2015  . CKD (chronic kidney disease) stage 3, GFR 30-59 ml/min 11/07/2014  . UTI (urinary tract infection) 02/08/2014  . Acid reflux 12/26/2013  . Irritable bowel syndrome with constipation 12/26/2013  . Welcome to Medicare preventive visit 10/21/2013  . Hyperlipidemia LDL goal <100 07/15/2013  . Obesity 07/15/2013  . Chronic venous insufficiency 03/21/2013  . DM (diabetes mellitus), type 2, uncontrolled, with renal complications 089/37/3428 . Lack of libido 11/05/2012  . Barrett's esophagus 11/05/2012  . Hiatal hernia 11/05/2012  . Essential hypertension 11/05/2012  . Urinary incontinence  Marquize Seib 04/09/2015, 1:33 PM  [place] [facility] [address] [place], [address] Phone: [phone]   Fax:  [phone]

## 2015-04-09 NOTE — Patient Instructions (Addendum)
Improved exercise technique, movement at target joints, use of target muscles after mod verbal, visual, tactile cues.    Gave supine AAROM R shoulder flexion, ER (arm propped to at least neutral, no abduction) (hand out provided), seated table towel  slides scaption as part of her HEP. Pt demonstrated and verbalized understanding.

## 2015-04-21 ENCOUNTER — Encounter: Payer: Self-pay | Admitting: Internal Medicine

## 2015-04-23 ENCOUNTER — Telehealth: Payer: Self-pay | Admitting: Internal Medicine

## 2015-04-23 NOTE — Telephone Encounter (Signed)
DEXA results sent  via [place]

## 2015-04-27 ENCOUNTER — Ambulatory Visit: Payer: Medicare PPO | Attending: Orthopedic Surgery

## 2015-04-27 DIAGNOSIS — R29898 Other symptoms and signs involving the musculoskeletal system: Secondary | ICD-10-CM

## 2015-04-27 DIAGNOSIS — Z9889 Other specified postprocedural states: Secondary | ICD-10-CM

## 2015-04-27 DIAGNOSIS — M25511 Pain in right shoulder: Secondary | ICD-10-CM | POA: Diagnosis not present

## 2015-04-27 DIAGNOSIS — Z4789 Encounter for other orthopedic aftercare: Secondary | ICD-10-CM | POA: Diagnosis not present

## 2015-04-27 NOTE — Patient Instructions (Signed)
Improved exercise technique, joint protection, scapular control, movement at target joints, use of target muscles after mod verbal, visual, tactile cues.

## 2015-04-27 NOTE — Therapy (Signed)
[facility] [address] [place], [address] Phone: [phone]   Fax:  [phone]  Physical Therapy Treatment and Progress Report  Patient Details  Name: Sandra Hendrix MRN: [id number] Date of Birth: 18-Oct-1945 Referring Provider:  Justice Britain, MD  Encounter Date: 04/27/2015      PT End of Session - 04/27/15 1447    Visit Number 11   Number of Visits 23   Date for PT Re-Evaluation 05/28/15   Authorization Type 2   Authorization Time Period 10   PT Start Time 1447   PT Stop Time 1535   PT Time Calculation (min) 48 min   Activity Tolerance Patient tolerated treatment well;No increased pain   Behavior During Therapy [facility] for tasks assessed/performed      Past Medical History  Diagnosis Date  . Diabetes mellitus without complication   . Hyperlipidemia   . Hypertension   . Arthritis   . Depression   . GERD (gastroesophageal reflux disease)   . Allergy   . Urinary incontinence   . Varicose veins     Past Surgical History  Procedure Laterality Date  . Joint replacement Right 2007  . Joint replacement Left 2004  . Rotator cuff repair Right     There were no vitals filed for this visit.  Visit Diagnosis:  S/P rotator cuff repair - Plan: PT plan of care cert/re-cert  Orthopedic aftercare - Plan: PT plan of care cert/re-cert  Weakness of shoulder - Plan: PT plan of care cert/re-cert  Pain in joint, shoulder region, right - Plan: PT plan of care cert/re-cert      Subjective Assessment - 04/27/15 1448    Subjective 1/10 R shoulder soreness. Recently returned from a church trip to [place] (was a Producer, television/film/video)   Patient Stated Goals To be able to use her R shoulder with no pain.    Currently in Pain? Yes   Pain Score 1    Multiple Pain Sites No            [facility] PT Assessment - 04/27/15 1504    AAROM   Overall AAROM Comments Supine R shoulder flexion AAROM 141 degrees, abduction 105  degrees, supine ER at neutral shoulder 45 degrees            [facility] Adult PT Treatment/Exercise - 04/27/15 1520    Exercises   Other Exercises  Directed patient with supine AAROM: R shoulder flexion 10x3, then assisted R shoulder scaption 10x3, then assisted R shoulder ER 10x3 (neutral shoulder); L S/L assisted R shoulder flexion with scap rectraction 10x3. Standing R shoulder ER and IR submax (50 % effort) isometrics 10x5 seconds each (neutral shoulder). Reviewed progress/current status with R shoulder AAROM with patient.   no pain with sub max isometrics.    Manual Therapy   Manual therapy comments Soft tissue mobilization to R terres major           PT Education - 04/27/15 2230    Education provided Yes   Education Details Ther-ex, scapular control, rotator cuff anatomy, roll of infraspinatus with arm movements   Person(s) Educated Patient   Methods Explanation;Demonstration;Tactile cues;Verbal cues   Comprehension Verbalized understanding;Returned demonstration             PT Long Term Goals - 04/27/15 2240    PT LONG TERM GOAL #1   Title Patient will be independent with her HEP to improve PROM, A/AROM and ability to raise her R  arm when appropriate.    Time 4   Period Weeks   Status On-going   PT LONG TERM GOAL #2   Title Patient will improve her Quick Dash score by at least 14% as a demonstration of improved function.   Time 4   Period Weeks   Status Achieved   PT LONG TERM GOAL #3   Title Patient will improve R shoulder flexion, abduction PROM to at least 125 degrees to promote ability to raise her R arm.    Time 4   Period Weeks   Status Partially Met   PT LONG TERM GOAL #4   Title Patient will improve R shoulder flexion and abduction AAROM to at least 110 degrees to promote ability to raise her R arm when appropriate.    Time 4   Period Weeks   Status Partially Met   PT LONG TERM GOAL #5   Title Patient will improve R shouler ER to 60 degrees AROM and IR  to at least 60 degrees AROM when appropriate to promote ability to groom, and don and doff clothes.    Time 4   Period Weeks   Status On-going   Additional Long Term Goals   Additional Long Term Goals Yes   PT LONG TERM GOAL #6   Title Patient will improve her Quick Dash score to 27% or less as a demonstration of improved function.   Time 4   Period Weeks   Status On-going   PT LONG TERM GOAL #7   Title Patient will improve R shoulder flexion and abduction AROM to 90 degress to promote ability to raise her R arm.    Time 4   Period Weeks   Status New               Plan - 04/27/15 2233    Clinical Impression Statement Patient has demonstrated similar R shoulder AAROM since last session in 04/09/2015 but improved ROM overall since initial evaluation. Some discomfort with end range R shoulder flexion AAROM in supine which decreased with soft tissue mobilization to her terres major muscle. Currently working on [employer] of her R shoulder in conjunction with scapular control. Patient will beneft from continued skilled physical therapy services to continue promoting AAROM, scapular control, and gradually progressing towards AROM to promote function and ability to raise her R arm when appropriate.    Pt will benefit from skilled therapeutic intervention in order to improve on the following deficits Decreased range of motion;Pain;Improper body mechanics;Decreased strength   Rehab Potential Good   PT Frequency 2x / week   PT Duration 4 weeks   PT Treatment/Interventions Therapeutic exercise;Manual techniques;Therapeutic activities;Moist Heat;Electrical Stimulation;Cryotherapy;ADLs/Self Care Home Management   PT Next Visit Plan AAROM, scapular control, function, manual therapy   Consulted and Agree with Plan of Care Patient        Problem List Patient Active Problem List   Diagnosis Date Noted  . Mass of left breast 03/04/2015  . Ileus, postoperative 02/21/2015  . CKD (chronic kidney  disease) stage 3, GFR 30-59 ml/min 11/07/2014  . UTI (urinary tract infection) 02/08/2014  . Acid reflux 12/26/2013  . Irritable bowel syndrome with constipation 12/26/2013  . Welcome to Medicare preventive visit 10/21/2013  . Hyperlipidemia LDL goal <100 07/15/2013  . Obesity 07/15/2013  . Chronic venous insufficiency 03/21/2013  . DM (diabetes mellitus), type 2, uncontrolled, with renal complications 45/11/8880  . Lack of libido 11/05/2012  . Barrett's esophagus 11/05/2012  . Hiatal hernia  11/05/2012  . Essential hypertension 11/05/2012  . Urinary incontinence     Rhea Kaelin 04/27/2015, 11:09 PM  [facility] [address], [place], [address] Phone: [phone]   Fax:  [phone]

## 2015-05-04 ENCOUNTER — Ambulatory Visit: Payer: Medicare PPO

## 2015-05-04 DIAGNOSIS — M25511 Pain in right shoulder: Secondary | ICD-10-CM

## 2015-05-04 DIAGNOSIS — R29898 Other symptoms and signs involving the musculoskeletal system: Secondary | ICD-10-CM

## 2015-05-04 DIAGNOSIS — Z9889 Other specified postprocedural states: Secondary | ICD-10-CM | POA: Diagnosis not present

## 2015-05-04 DIAGNOSIS — Z4789 Encounter for other orthopedic aftercare: Secondary | ICD-10-CM | POA: Diagnosis not present

## 2015-05-04 NOTE — Patient Instructions (Addendum)
Improved exercise technique, movement at target joints, use of target muscles after mod verbal, visual, tactile cues.    Gave supine R shoulder flexion to 90 degrees starting with elbow bent 5x3, standing hand wall towel slides to 90 degrees shoulder flexion and scaption starting with elbow bent 5x3 each way. All pain free range, with scapular retraction, and with elbow in (no shoulder abduction and IR). Patient demonstrated and verbalized understanding.

## 2015-05-04 NOTE — Therapy (Signed)
[facility] [address], [place], [address] Phone: [phone]   Fax:  [phone]  Physical Therapy Treatment  Patient Details  Name: Sandra Hendrix MRN: [id number] Date of Birth: 09/24/1946 Referring Provider:  Justice Britain, MD  Encounter Date: 05/04/2015      PT End of Session - 05/04/15 1521    Visit Number 12   Number of Visits 23   Date for PT Re-Evaluation 05/28/15   Authorization Type 3   Authorization Time Period 10   Authorization - Visit Number 2   Authorization - Number of Visits 6  authorized visits [employer] 7/15-8/29   PT Start Time 6269   PT Stop Time 4854   PT Time Calculation (min) 44 min   Activity Tolerance Patient tolerated treatment well;No increased pain   Behavior During Therapy [facility] for tasks assessed/performed      Past Medical History  Diagnosis Date  . Diabetes mellitus without complication   . Hyperlipidemia   . Hypertension   . Arthritis   . Depression   . GERD (gastroesophageal reflux disease)   . Allergy   . Urinary incontinence   . Varicose veins     Past Surgical History  Procedure Laterality Date  . Joint replacement Right 2007  . Joint replacement Left 2004  . Rotator cuff repair Right     There were no vitals filed for this visit.  Visit Diagnosis:  S/P rotator cuff repair  Orthopedic aftercare  Weakness of shoulder  Pain in joint, shoulder region, right      Subjective Assessment - 05/04/15 1522    Subjective No pain currently. Wakes up with soreness both shoulders. Does exercises which helps make it feel better.    Patient Stated Goals To be able to use her R shoulder with no pain.    Currently in Pain? No/denies   Multiple Pain Sites No            [facility] Adult PT Treatment/Exercise - 05/04/15 1524    Exercises   Other Exercises  Directed patient with supine AAROM R shoulder flexion (self assist), scaption with UE ranger assist  10x3 each way; supine R shoulder active flexion to 90 degrees starting with elbow bent 5x2, supine rhythmic stabilization (right shoulder at 90 degrees flexion) with manual perturbations from PT, L S/L R shoulder ER (pain free range) 10x3, L S/L R shoulder flexion 10x3 (gravity eliminated shoulder flexion, pain free range), seated bilateral shoulder rolls backwards 15x2, standing L bicep curls (to decrease L scapular burning sensation) 10x2, standing towel wall slides for flexion and scaption AAROM starting with elbow bend and to 90 degrees flexion and scaption 5x3 each way.             PT Education - 05/04/15 1626    Education provided Yes   Education Details ther-ex, HEP   Person(s) Educated Patient   Methods Explanation;Demonstration;Tactile cues;Verbal cues   Comprehension Verbalized understanding;Returned demonstration           PT Long Term Goals - 04/27/15 2240    PT LONG TERM GOAL #1   Title Patient will be independent with her HEP to improve PROM, A/AROM and ability to raise her R arm when appropriate.    Time 4   Period Weeks   Status On-going   PT LONG TERM GOAL #2   Title Patient will improve her Quick Dash score by at least 14% as a demonstration of improved function.  Time 4   Period Weeks   Status Achieved   PT LONG TERM GOAL #3   Title Patient will improve R shoulder flexion, abduction PROM to at least 125 degrees to promote ability to raise her R arm.    Time 4   Period Weeks   Status Partially Met   PT LONG TERM GOAL #4   Title Patient will improve R shoulder flexion and abduction AAROM to at least 110 degrees to promote ability to raise her R arm when appropriate.    Time 4   Period Weeks   Status Partially Met   PT LONG TERM GOAL #5   Title Patient will improve R shouler ER to 60 degrees AROM and IR to at least 60 degrees AROM when appropriate to promote ability to groom, and don and doff clothes.    Time 4   Period Weeks   Status On-going    Additional Long Term Goals   Additional Long Term Goals Yes   PT LONG TERM GOAL #6   Title Patient will improve her Quick Dash score to 27% or less as a demonstration of improved function.   Time 4   Period Weeks   Status On-going   PT LONG TERM GOAL #7   Title Patient will improve R shoulder flexion and abduction AROM to 90 degress to promote ability to raise her R arm.    Time 4   Period Weeks   Status New            Plan - 05/04/15 1608    Clinical Impression Statement Patient able to perform supine R shoulder flexion to 90 degrees, S/L R shoulder flexion, and standing towel wall slides to 90 degrees R shoulder flexion and scaption without pain and with good scapular control. Decreased L scapular (infraspinatus area and proximal triceps insertion area) burning sensation after standing L bicep curls.    Pt will benefit from skilled therapeutic intervention in order to improve on the following deficits Decreased range of motion;Pain;Improper body mechanics;Decreased strength   Rehab Potential Good   PT Frequency 2x / week   PT Duration 4 weeks   PT Treatment/Interventions Therapeutic exercise;Manual techniques;Therapeutic activities;Moist Heat;Electrical Stimulation;Cryotherapy;ADLs/Self Care Home Management   PT Next Visit Plan AAROM, AROM, scapular control, function, manual therapy   Consulted and Agree with Plan of Care Patient        Problem List Patient Active Problem List   Diagnosis Date Noted  . Mass of left breast 03/04/2015  . Ileus, postoperative 02/21/2015  . CKD (chronic kidney disease) stage 3, GFR 30-59 ml/min 11/07/2014  . UTI (urinary tract infection) 02/08/2014  . Acid reflux 12/26/2013  . Irritable bowel syndrome with constipation 12/26/2013  . Welcome to Medicare preventive visit 10/21/2013  . Hyperlipidemia LDL goal <100 07/15/2013  . Obesity 07/15/2013  . Chronic venous insufficiency 03/21/2013  . DM (diabetes mellitus), type 2, uncontrolled, with  renal complications 29/92/4268  . Lack of libido 11/05/2012  . Barrett's esophagus 11/05/2012  . Hiatal hernia 11/05/2012  . Essential hypertension 11/05/2012  . Urinary incontinence     Hilma Steinhilber 05/04/2015, 4:46 PM  [facility] [address] [place], [address] Phone: [phone]   Fax:  [phone]

## 2015-05-05 ENCOUNTER — Other Ambulatory Visit: Payer: Self-pay | Admitting: Internal Medicine

## 2015-05-11 ENCOUNTER — Ambulatory Visit: Payer: Medicare PPO

## 2015-05-11 DIAGNOSIS — R29898 Other symptoms and signs involving the musculoskeletal system: Secondary | ICD-10-CM | POA: Diagnosis not present

## 2015-05-11 DIAGNOSIS — Z4789 Encounter for other orthopedic aftercare: Secondary | ICD-10-CM | POA: Diagnosis not present

## 2015-05-11 DIAGNOSIS — Z9889 Other specified postprocedural states: Secondary | ICD-10-CM | POA: Diagnosis not present

## 2015-05-11 DIAGNOSIS — M25511 Pain in right shoulder: Secondary | ICD-10-CM | POA: Diagnosis not present

## 2015-05-11 NOTE — Therapy (Signed)
[facility] [address], [place], [address] Phone: [phone]   Fax:  [phone]  Physical Therapy Treatment  Patient Details  Name: Sandra Hendrix MRN: [id number] Date of Birth: [birth year] Referring Provider:  Justice Britain, MD  Encounter Date: 05/11/2015      PT End of Session - 05/11/15 1352    Visit Number 13   Number of Visits 23   Date for PT Re-Evaluation 05/28/15   Authorization Type 4   Authorization Time Period 10   Authorization - Visit Number 3   Authorization - Number of Visits 6   PT Start Time 1155   PT Stop Time 2080   PT Time Calculation (min) 42 min   Activity Tolerance Patient tolerated treatment well;No increased pain   Behavior During Therapy [facility] for tasks assessed/performed      Past Medical History  Diagnosis Date  . Diabetes mellitus without complication   . Hyperlipidemia   . Hypertension   . Arthritis   . Depression   . GERD (gastroesophageal reflux disease)   . Allergy   . Urinary incontinence   . Varicose veins     Past Surgical History  Procedure Laterality Date  . Joint replacement Right 2007  . Joint replacement Left 2004  . Rotator cuff repair Right     There were no vitals filed for this visit.  Visit Diagnosis:  S/P rotator cuff repair  Orthopedic aftercare  Weakness of shoulder  Pain in joint, shoulder region, right      Subjective Assessment - 05/11/15 1357    Subjective Pt states that her R shoulder is doing good. No pain.    Patient Stated Goals To be able to use her R shoulder with no pain.    Currently in Pain? No/denies   Multiple Pain Sites No          [facility] Adult PT Treatment/Exercise - 05/11/15 1358    Exercises   Other Exercises  Directed patient with pulleys flexion, and scaption 2 min each direction, L S/L R shoulder ER 10x3, L S/L R  shoulder flexion 10x3, supine R shoulder flexion AROM 5x3, reclined R shoulder flexion  AROM to end range 5x3, standing R shoulder ER isometrics (75% effort) 10x5 seconds for 3 sets, R shoulder IR 10x5 second  isometrics (75% effort), towel wall slides R shoulder scaption 10x3, towel wall slides abduction 10x          PT Education - 05/11/15 1557    Education provided Yes   Education Details ther-ex   [employer]) Educated Patient   Methods Demonstration;Tactile cues;Verbal cues   Comprehension Returned demonstration          PT Long Term Goals - 04/27/15 2240    PT LONG TERM GOAL #1   Title Patient will be independent with her HEP to improve PROM, A/AROM and ability to raise her R arm when appropriate.    Time 4   Period Weeks   Status On-going   PT LONG TERM GOAL #2   Title Patient will improve her Quick Dash score by at least 14% as a demonstration of improved function.   Time 4   Period Weeks   Status Achieved   PT LONG TERM GOAL #3   Title Patient will improve R shoulder flexion, abduction PROM to at least 125 degrees to promote ability to raise her R arm.    Time 4   Period Weeks  Status Partially Met   PT LONG TERM GOAL #4   Title Patient will improve R shoulder flexion and abduction AAROM to at least 110 degrees to promote ability to raise her R arm when appropriate.    Time 4   Period Weeks   Status Partially Met   PT LONG TERM GOAL #5   Title Patient will improve R shouler ER to 60 degrees AROM and IR to at least 60 degrees AROM when appropriate to promote ability to groom, and don and doff clothes.    Time 4   Period Weeks   Status On-going   Additional Long Term Goals   Additional Long Term Goals Yes   PT LONG TERM GOAL #6   Title Patient will improve her Quick Dash score to 27% or less as a demonstration of improved function.   Time 4   Period Weeks   Status On-going   PT LONG TERM GOAL #7   Title Patient will improve R shoulder flexion and abduction AROM to 90 degress to promote ability to raise her R arm.    Time 4   Period Weeks    Status New          Plan - 05/11/15 1559    Clinical Impression Statement Able to perform AROM exercises without complain of pain. Patient making progress towards goals.    Pt will benefit from skilled therapeutic intervention in order to improve on the following deficits Decreased range of motion;Pain;Improper body mechanics;Decreased strength   Rehab Potential Good   PT Frequency 2x / week   PT Duration 4 weeks   PT Treatment/Interventions Therapeutic exercise;Manual techniques;Therapeutic activities;Moist Heat;Electrical Stimulation;Cryotherapy;ADLs/Self Care Home Management   PT Next Visit Plan AAROM, AROM, scapular control, function, manual therapy   Consulted and Agree with Plan of Care Patient       Problem List Patient Active Problem List   Diagnosis Date Noted  . Mass of left breast 03/04/2015  . Ileus, postoperative 02/21/2015  . CKD (chronic kidney disease) stage 3, GFR 30-59 ml/min 11/07/2014  . UTI (urinary tract infection) 02/08/2014  . Acid reflux 12/26/2013  . Irritable bowel syndrome with constipation 12/26/2013  . Welcome to Medicare preventive visit 10/21/2013  . Hyperlipidemia LDL goal <100 07/15/2013  . Obesity 07/15/2013  . Chronic venous insufficiency 03/21/2013  . DM (diabetes mellitus), type 2, uncontrolled, with renal complications 72/03/2181  . Lack of libido 11/05/2012  . Barrett's esophagus 11/05/2012  . Hiatal hernia 11/05/2012  . Essential hypertension 11/05/2012  . Urinary incontinence      Joneen Boers PT, DPT   05/11/2015, 4:04 PM  [facility] [address] [place], [address] Phone: [phone]   Fax:  [phone]

## 2015-05-11 NOTE — Patient Instructions (Signed)
Improved exercise technique, movement at target joints, use of target muscles after mod verbal, visual, tactile cues.   

## 2015-05-18 ENCOUNTER — Ambulatory Visit: Payer: Medicare PPO

## 2015-05-18 DIAGNOSIS — M25511 Pain in right shoulder: Secondary | ICD-10-CM | POA: Diagnosis not present

## 2015-05-18 DIAGNOSIS — Z4789 Encounter for other orthopedic aftercare: Secondary | ICD-10-CM | POA: Diagnosis not present

## 2015-05-18 DIAGNOSIS — Z9889 Other specified postprocedural states: Secondary | ICD-10-CM

## 2015-05-18 DIAGNOSIS — R29898 Other symptoms and signs involving the musculoskeletal system: Secondary | ICD-10-CM

## 2015-05-18 NOTE — Therapy (Signed)
[facility] [address] [place], [address] Phone: [phone]   Fax:  [phone]  Physical Therapy Treatment and Progress Report  Patient Details  Name: Sandra Hendrix MRN: [id number] Date of Birth: [birth year] Referring Provider:  Justice Britain, MD  Encounter Date: 05/18/2015      PT End of Session - 05/18/15 1121    Visit Number 14   Number of Visits 29   Date for PT Re-Evaluation 06/18/15   Authorization Type 5   Authorization Time Period 10   Authorization - Visit Number 4   Authorization - Number of Visits 6   PT Start Time 7903   PT Stop Time 1121   PT Time Calculation (min) 45 min   Activity Tolerance Patient tolerated treatment well;No increased pain   Behavior During Therapy [facility] for tasks assessed/performed      Past Medical History  Diagnosis Date  . Diabetes mellitus without complication   . Hyperlipidemia   . Hypertension   . Arthritis   . Depression   . GERD (gastroesophageal reflux disease)   . Allergy   . Urinary incontinence   . Varicose veins     Past Surgical History  Procedure Laterality Date  . Joint replacement Right 2007  . Joint replacement Left 2004  . Rotator cuff repair Right     There were no vitals filed for this visit.  Visit Diagnosis:  S/P rotator cuff repair - Plan: PT plan of care cert/re-cert  Orthopedic aftercare - Plan: PT plan of care cert/re-cert  Weakness of shoulder - Plan: PT plan of care cert/re-cert  Pain in joint, shoulder region, right - Plan: PT plan of care cert/re-cert      Subjective Assessment - 05/18/15 2119    Subjective Patient states that her R shoulder is doing ok   Patient Stated Goals To be able to use her R shoulder with no pain.    Currently in Pain? No/denies   Multiple Pain Sites No          [facility] PT Assessment - 05/18/15 1112    AROM   Overall AROM Comments seated R shoulder flexion AROM 143 degrees; R  shoulder scaption 101 degrees.    AAROM   Overall AAROM Comments Supine R shoulder ER AAROM (neutral shoulder) 45 degrees; supine R shoulder abduction AAROM 118 degrees; supine R shoulder flexion AROM 153 degrees   Strength   Right Shoulder External Rotation 3/5  at available range          [facility] Adult PT Treatment/Exercise - 05/18/15 1050    Exercises   Other Exercises  Directed patient with pulleys flexion, and scaption 2 min each direction, standing R shoulder scaption with UE ranger assist 10x2,  L S/L R shoulder ER 10x3 AROM, supine R shoulder flexion AROM 5x4, supine R shoulder rhythmic stabilization at 90 degrees flexion, R shoulder towel slides scaption 10x3, seated R shoulder flexion AAROM with L UE assist (upright position), standing R shoulder ER isometrics (submax) 10x3 with 5 second holds, seated R shoulder flexion AROM starting with elbow bent 1x, seated R shoulder scaption starting with elbow bent AROM 1x. Reviewed progress/current status with R shoulder ROM with pt.            PT Education - 05/18/15 2125    Education provided Yes   Education Details ther-ex, progress/current status R shoulder ROM   Person(s) Educated Patient   Methods Explanation;Demonstration;Tactile cues;Verbal cues  Comprehension Verbalized understanding;Returned demonstration            PT Long Term Goals - 05/18/15 2054    PT LONG TERM GOAL #1   Title Patient will be independent with her HEP to improve PROM, A/AROM and ability to raise her R arm when appropriate.    Time 4   Period Weeks   Status On-going   PT LONG TERM GOAL #2   Title Patient will improve her Quick Dash score by at least 14% as a demonstration of improved function.   Time 4   Period Weeks   Status Achieved   PT LONG TERM GOAL #3   Title Patient will improve R shoulder flexion, abduction PROM to at least 125 degrees to promote ability to raise her R arm.    Time 4   Period Weeks   Status Partially Met   PT LONG  TERM GOAL #4   Title Patient will improve R shoulder flexion and abduction AAROM to at least 110 degrees to promote ability to raise her R arm when appropriate.    Time 4   Period Weeks   Status Achieved   PT LONG TERM GOAL #5   Title Patient will improve R shouler ER to 60 degrees AROM and IR to at least 60 degrees AROM when appropriate to promote ability to groom, and don and doff clothes.    Time 4   Period Weeks   Status On-going   Additional Long Term Goals   Additional Long Term Goals Yes   PT LONG TERM GOAL #6   Title Patient will improve her Quick Dash score to 27% or less as a demonstration of improved function.   Time 4   Period Weeks   Status On-going   PT LONG TERM GOAL #7   Title Patient will improve R shoulder flexion and abduction/scaption AROM to 90 degress to promote ability to raise her R arm.    Time 4   Period Weeks   Status Achieved   PT LONG TERM GOAL #8   Title Patient will improve R shoulder ER strength to at least 4/5 to promote ability to raise her R arm actively while promoting proper glenohumeral control to protect surgery.    Time 4   Period Weeks   Status New            Plan - 05/18/15 1041    Clinical Impression Statement S/P 13 weeks post op on 05/19/15. Patient  has demonstrated improved supine R shoulder flexion, abduction AAROM,  slight improvement in R shoulder ER (neutral shoulder),  since last progress report, and is able to actively raise her R shoulder  into flexion 143 degrees and scaption 101 degrees without assistance (starting with elbow bent)   seated today.  Patient making very good progress towards goals. Patient  will benefit from continued skilled physical therapy services to improve R  shoulder and scapular strength, especially her ER muscles to promote  proper glenohumeral control when she raises her R arm against gravity to  help protect her surgical repair and to improve ability to use her R arm  during her daily tasks.       Pt will benefit from skilled therapeutic intervention in order to improve on the following deficits Decreased range of motion;Pain;Improper body mechanics;Decreased strength   Rehab Potential Good   PT Frequency 2x / week   PT Duration 4 weeks   PT Treatment/Interventions Therapeutic exercise;Manual techniques;Therapeutic activities;Moist Heat;Electrical Stimulation;Cryotherapy;ADLs/Self Care  Home Management   PT Next Visit Plan AAROM, AROM, scapular control, strengthening, function, manual therapy prn.   Consulted and Agree with Plan of Care Patient        Problem List Patient Active Problem List   Diagnosis Date Noted  . Mass of left breast 03/04/2015  . Ileus, postoperative 02/21/2015  . CKD (chronic kidney disease) stage 3, GFR 30-59 ml/min 11/07/2014  . UTI (urinary tract infection) 02/08/2014  . Acid reflux 12/26/2013  . Irritable bowel syndrome with constipation 12/26/2013  . Welcome to Medicare preventive visit 10/21/2013  . Hyperlipidemia LDL goal <100 07/15/2013  . Obesity 07/15/2013  . Chronic venous insufficiency 03/21/2013  . DM (diabetes mellitus), type 2, uncontrolled, with renal complications 06/14/8021  . Lack of libido 11/05/2012  . Barrett's esophagus 11/05/2012  . Hiatal hernia 11/05/2012  . Essential hypertension 11/05/2012  . Urinary incontinence      Thank you for your referral.   Joneen Boers PT, DPT   05/18/2015, 9:37 PM  [place] [address] [place], [address] Phone: [phone]   Fax:  [phone]

## 2015-05-18 NOTE — Patient Instructions (Signed)
Improved exercise technique, surgical protection, movement at target joints, use of target muscles after mod verbal, visual, tactile cues.

## 2015-05-25 ENCOUNTER — Other Ambulatory Visit (INDEPENDENT_AMBULATORY_CARE_PROVIDER_SITE_OTHER): Payer: Medicare PPO

## 2015-05-25 ENCOUNTER — Ambulatory Visit: Payer: Medicare PPO | Admitting: Physical Therapy

## 2015-05-25 ENCOUNTER — Telehealth: Payer: Self-pay | Admitting: *Deleted

## 2015-05-25 DIAGNOSIS — Z4789 Encounter for other orthopedic aftercare: Secondary | ICD-10-CM

## 2015-05-25 DIAGNOSIS — E119 Type 2 diabetes mellitus without complications: Secondary | ICD-10-CM | POA: Diagnosis not present

## 2015-05-25 DIAGNOSIS — M25511 Pain in right shoulder: Secondary | ICD-10-CM

## 2015-05-25 DIAGNOSIS — Z9889 Other specified postprocedural states: Secondary | ICD-10-CM | POA: Diagnosis not present

## 2015-05-25 DIAGNOSIS — R29898 Other symptoms and signs involving the musculoskeletal system: Secondary | ICD-10-CM

## 2015-05-25 LAB — HEMOGLOBIN A1C: Hgb A1c MFr Bld: 7 % — ABNORMAL HIGH (ref 4.6–6.5)

## 2015-05-25 NOTE — Patient Instructions (Addendum)
   All exercises provided were adapted from [email or web address]. Patient was provided a written handout with pictures as described. Any additional cues were manually entered in to handout and copied in to this document.  PRONE ROWS  Lying face down with your elbows straight, slowly raise your arms upward while bending your elbows.    FREE WEIGHT - CHEST PRESS  Lie on your back with your elbows bent. Next, slowly raise up your arms towards the ceiling while extending your elbows straight up above your head.  ELASTIC BAND EXTERNAL ROTATION - BILATERAL   While holding an elastic band with both hands and elbows bent at your side, pull the band away from your stomach.   Keep your elbows at your side the entire time.      Rows with Theraband  Standing with feet hip width apart, stomach drawn in and glute muscles tight. Start with arms extended in front of you. Pull shoulder blades down and back and then pull elbows in towards body. Slowly return to starting position.

## 2015-05-25 NOTE — Telephone Encounter (Signed)
Labs and dX?  

## 2015-05-25 NOTE — Therapy (Signed)
[place] [address] [place], [address] Phone: [phone]   Fax:  [phone]  Physical Therapy Treatment  Patient Details  Name: Sandra Hendrix MRN: [id number] Date of Birth: [birth year] Referring Provider:  Justice Britain, MD  Encounter Date: 05/25/2015      PT End of Session - 05/25/15 1537    Visit Number 15   Number of Visits 29   Date for PT Re-Evaluation 06/18/15   Authorization Type 6   Authorization Time Period 10   Authorization - Visit Number 5   Authorization - Number of Visits 6   PT Start Time 2376   PT Stop Time 2831   PT Time Calculation (min) 42 min   Activity Tolerance Patient tolerated treatment well;No increased pain   Behavior During Therapy [facility] for tasks assessed/performed      Past Medical History  Diagnosis Date  . Diabetes mellitus without complication   . Hyperlipidemia   . Hypertension   . Arthritis   . Depression   . GERD (gastroesophageal reflux disease)   . Allergy   . Urinary incontinence   . Varicose veins     Past Surgical History  Procedure Laterality Date  . Joint replacement Right 2007  . Joint replacement Left 2004  . Rotator cuff repair Right     There were no vitals filed for this visit.  Visit Diagnosis:  S/P rotator cuff repair  Orthopedic aftercare  Weakness of shoulder  Pain in joint, shoulder region, right      Subjective Assessment - 05/25/15 1438    Subjective Patient reports only minimal pain with full shoulder flexion, no other motions painful. Patient actually has more issues with left shoulder, she states her PA examined this and informed her it appears to be more from her neck.    Pertinent History S/P R RCR last Tuesday Feb 17, 2015 with debridement of spur.  Has not physical therapy yet. Does not know of any special instructions for her R shoulder from her surgeon. Next MD appointment June 27th. Currently has difficulty  with  grooming, putting  her earings on, donning and doffing clothes.  Takes Tylenol 325 x2  every 6 hours. Just got out of the hospital Tuesday  Feb 24, 2015 secondary to bowel shut down from taking Oxycodone.    Patient Stated Goals To be able to use her R shoulder with no pain.    Currently in Pain? No/denies        Chest Press with BW x 10, 2 sets with 1# DB (good technique)  Prone rows with BW 2 sets x 10 repetitions   Scapular punches 2 sets x 10 repetitions with 1# DB   Attempted prone horizontal abductions with thumbs up and palms down x 4, both uncomfortable and discontinued   Sidelying ER's with RUE with 1# DB x 10 repetitions for 2 sets.   Seated bilateral ERs with yellow thera-band x 10 repetitions for 2 sets (cuing to maintain more erect posture and more "neutral" position of humerus, initially completed with anteriorly tipped scapula and minimal flexion in shoulder leading to poor recruitment.   Standing yellow t-band rows 2 sets x 10 repetitions per set, cuing for retraction/pinching of shoulder blades.   Patient dropped off Quick Dash - 11.4% disability.                           PT Education -  05/25/15 1649    Education provided Yes   Education Details Patient educated on exercise program and progressions. Patient educated on using soreness and pain as a guide to her activity levels and that she is still in a protective phase.    Person(s) Educated Patient   Methods Explanation;Demonstration;Handout   Comprehension Verbalized understanding;Returned demonstration             PT Long Term Goals - 05/25/15 1653    PT LONG TERM GOAL #1   Title Patient will be independent with her HEP to improve PROM, A/AROM and ability to raise her R arm when appropriate.    Time 4   Period Weeks   Status On-going   PT LONG TERM GOAL #2   Title Patient will improve her Quick Dash score by at least 14% as a demonstration of improved function.   Time 4    Period Weeks   Status Achieved   PT LONG TERM GOAL #3   Title Patient will improve R shoulder flexion, abduction PROM to at least 125 degrees to promote ability to raise her R arm.    Baseline Able to complete this actively 05/25/2015.    Time 4   Period Weeks   Status Achieved   PT LONG TERM GOAL #4   Title Patient will improve R shoulder flexion and abduction AAROM to at least 110 degrees to promote ability to raise her R arm when appropriate.    Time 4   Period Weeks   Status Achieved   PT LONG TERM GOAL #5   Title Patient will improve R shouler ER to 60 degrees AROM and IR to at least 60 degrees AROM when appropriate to promote ability to groom, and don and doff clothes.    Time 4   Period Weeks   Status On-going   PT LONG TERM GOAL #6   Title Patient will improve her Quick Dash score to 27% or less as a demonstration of improved function.   Baseline 11% reported on 05/25/2015.    Time 4   Period Weeks   Status Achieved   PT LONG TERM GOAL #7   Title Patient will improve R shoulder flexion and abduction/scaption AROM to 90 degress to promote ability to raise her R arm.    Time 4   Period Weeks   Status Achieved   PT LONG TERM GOAL #8   Title Patient will improve R shoulder ER strength to at least 4/5 to promote ability to raise her R arm actively while promoting proper glenohumeral control to protect surgery.    Time 4   Period Weeks   Status On-going               Plan - 05/25/15 1537    Clinical Impression Statement Patient demonstrates equivalent AROM in R and L UEs in flexion and scaption with minimal pain at end range flexion in RUE. Patient performed introductory strengthening program today for RUE targeting ERs, middle and lower trapezius, and serratus anterior. Patient educated extensively on exercise progressions and activity tolerance today, particularly that she needs to increase slowly and monitor her symptoms. Patient has made excellent progress and would  benefit from skilled PT services to progressively increase her resistance training to ensure proper loading of her UE musculature.    Pt will benefit from skilled therapeutic intervention in order to improve on the following deficits Decreased range of motion;Pain;Improper body mechanics;Decreased strength   Rehab Potential Good   PT  Frequency 2x / week   PT Duration 4 weeks   PT Treatment/Interventions Therapeutic exercise;Manual techniques;Therapeutic activities;Moist Heat;Electrical Stimulation;Cryotherapy;ADLs/Self Care Home Management   PT Next Visit Plan Continue and progress strengthening program initiated this session.    PT Home Exercise Plan See patient instructions.    Consulted and Agree with Plan of Care Patient          G-Codes - 2015/06/03 1652    Functional Assessment Tool Used Quick Dash    Functional Limitation Carrying, moving and handling objects   Carrying, Moving and Handling Objects Current Status [phone]) At least 1 percent but less than 20 percent impaired, limited or restricted   Carrying, Moving and Handling Objects Goal Status (E3154) 0 percent impaired, limited or restricted      Problem List Patient Active Problem List   Diagnosis Date Noted  . Mass of left breast 03/04/2015  . Ileus, postoperative 02/21/2015  . CKD (chronic kidney disease) stage 3, GFR 30-59 ml/min 11/07/2014  . UTI (urinary tract infection) 02/08/2014  . Acid reflux 12/26/2013  . Irritable bowel syndrome with constipation 12/26/2013  . Welcome to Medicare preventive visit 10/21/2013  . Hyperlipidemia LDL goal <100 07/15/2013  . Obesity 07/15/2013  . Chronic venous insufficiency 03/21/2013  . DM (diabetes mellitus), type 2, uncontrolled, with renal complications 00/86/7619  . Lack of libido 11/05/2012  . Barrett's esophagus 11/05/2012  . Hiatal hernia 11/05/2012  . Essential hypertension 11/05/2012  . Urinary incontinence    Kerman Passey, PT, DPT    06-03-15, 4:55  PM  [place] [facility] [address], [place], [address] Phone: [phone]   Fax:  [phone]

## 2015-05-26 LAB — LIPID PANEL
Cholesterol: 200 mg/dL (ref 0–200)
HDL: 78.5 mg/dL (ref >39.00)
LDL Cholesterol: 102 mg/dL — ABNORMAL HIGH (ref 0–99)
NonHDL: 121.53
Total CHOL/HDL Ratio: 3
Triglycerides: 97 mg/dL (ref 0.0–149.0)
VLDL: 19.4 mg/dL (ref 0.0–40.0)

## 2015-05-26 LAB — COMPREHENSIVE METABOLIC PANEL
ALT: 15 U/L (ref 0–35)
AST: 18 U/L (ref 0–37)
Albumin: 4.2 g/dL (ref 3.5–5.2)
Alkaline Phosphatase: 116 U/L (ref 39–117)
BUN: 27 mg/dL — ABNORMAL HIGH (ref 6–23)
CO2: 23 mEq/L (ref 19–32)
Calcium: 9.1 mg/dL (ref 8.4–10.5)
Chloride: 105 mEq/L (ref 96–112)
Creatinine, Ser: 1.09 mg/dL (ref 0.40–1.20)
GFR: 52.57 mL/min — ABNORMAL LOW (ref >60.00)
Glucose, Bld: 137 mg/dL — ABNORMAL HIGH (ref 70–99)
Potassium: 4 mEq/L (ref 3.5–5.1)
Sodium: 139 mEq/L (ref 135–145)
Total Bilirubin: 0.3 mg/dL (ref 0.2–1.2)
Total Protein: 6.9 g/dL (ref 6.0–8.3)

## 2015-05-26 LAB — LDL CHOLESTEROL, DIRECT: Direct LDL: 89 mg/dL

## 2015-05-27 DIAGNOSIS — E1140 Type 2 diabetes mellitus with diabetic neuropathy, unspecified: Secondary | ICD-10-CM | POA: Diagnosis not present

## 2015-05-27 DIAGNOSIS — I10 Essential (primary) hypertension: Secondary | ICD-10-CM | POA: Diagnosis not present

## 2015-05-27 DIAGNOSIS — N183 Chronic kidney disease, stage 3 (moderate): Secondary | ICD-10-CM | POA: Diagnosis not present

## 2015-05-28 ENCOUNTER — Encounter: Payer: Self-pay | Admitting: Internal Medicine

## 2015-06-01 NOTE — Telephone Encounter (Signed)
Encounter closed

## 2015-06-05 ENCOUNTER — Ambulatory Visit (INDEPENDENT_AMBULATORY_CARE_PROVIDER_SITE_OTHER): Payer: Medicare PPO | Admitting: Internal Medicine

## 2015-06-05 ENCOUNTER — Encounter: Payer: Self-pay | Admitting: Internal Medicine

## 2015-06-05 VITALS — BP 122/72 | HR 72 | Temp 98.0°F | Resp 12 | Ht 61.0 in | Wt 177.5 lb

## 2015-06-05 DIAGNOSIS — E785 Hyperlipidemia, unspecified: Secondary | ICD-10-CM

## 2015-06-05 DIAGNOSIS — E1122 Type 2 diabetes mellitus with diabetic chronic kidney disease: Secondary | ICD-10-CM

## 2015-06-05 DIAGNOSIS — N189 Chronic kidney disease, unspecified: Secondary | ICD-10-CM

## 2015-06-05 DIAGNOSIS — Z23 Encounter for immunization: Secondary | ICD-10-CM | POA: Diagnosis not present

## 2015-06-05 DIAGNOSIS — I10 Essential (primary) hypertension: Secondary | ICD-10-CM | POA: Diagnosis not present

## 2015-06-05 DIAGNOSIS — E669 Obesity, unspecified: Secondary | ICD-10-CM

## 2015-06-05 LAB — HM DIABETES FOOT EXAM: HM Diabetic Foot Exam: NORMAL

## 2015-06-05 NOTE — Progress Notes (Signed)
Subjective:  Patient ID: Sandra Hendrix, female    DOB: Jun 27, 1946  Age: 69 y.o. MRN: [id number]  CC: The primary encounter diagnosis was Encounter for immunization. Diagnoses of Type 2 diabetes mellitus with diabetic chronic kidney disease, Essential hypertension, Hyperlipidemia LDL goal <100, and Obesity were also pertinent to this visit.  [name] presents for  DM type 2 follow up,  She has been checking sugars once daily  And her fastings have been 157 to 170.  She has occasional lows when she forgets to eat lunch.  She is not exercising regularly due to joint pain,    Her Shoulders have been bothering her .  Had rotator cuff repair and bone spurs  removed from the right shoulder by Dr Onnie Graham in May , and during rehab her left started bothering her .  ortho is aware  She is being treated for  cystitis by Dr Holley Raring with [facility]    Outpatient Prescriptions Prior to Visit  Medication Sig Dispense Refill  . ACCU-CHEK AVIVA PLUS test strip Apply 1 each topically daily.    Marland Kitchen albuterol (PROVENTIL HFA;VENTOLIN HFA) 108 (90 BASE) MCG/ACT inhaler Inhale 2 puffs into the lungs every 6 (six) hours as needed for wheezing.    Marland Kitchen aspirin EC 81 MG tablet Take 81 mg by mouth daily.    Marland Kitchen atorvastatin (LIPITOR) 40 MG tablet Take 0.5 tablets (20 mg total) by mouth daily. (Patient taking differently: Take 20 mg by mouth at bedtime. ) 90 tablet 1  . azelastine (ASTEPRO) 137 MCG/SPRAY nasal spray Place 1 spray into the nose 2 (two) times daily. Use in each nostril as directed (Patient taking differently: Place 1 spray into the nose daily. Use in each nostril as directed) 30 mL 11  . BIOTIN PO Take by mouth. Take 1000mg  by mouth twice daily.    . Black Cohosh (REMIFEMIN) 20 MG TABS Take 20 mg by mouth 2 (two) times daily.     . Calcium Carbonate-Vitamin D (CALCIUM 600 + D PO) Take 1 tablet by mouth daily.    . Cholecalciferol (D3-1000 PO) Take 1,000 Int'l Units by mouth every morning.     . fish  oil-omega-3 fatty acids 1000 MG capsule Take 1 capsule by mouth daily.    . fluticasone (FLONASE) 50 MCG/ACT nasal spray Place 1 spray into both nostrils daily. 48 g 1  . glipiZIDE (GLUCOTROL XL) 10 MG 24 hr tablet TAKE 1 TABLET EVERY DAY 90 tablet 1  . JANUVIA 25 MG tablet TAKE 1 TABLET EVERY DAY 90 tablet 1  . [employer] (ACCU-CHEK SOFTCLIX) lancets Use to test blood sugars 1 -2 times daily 1 each 3  . lisinopril (PRINIVIL,ZESTRIL) 20 MG tablet TAKE 1 TABLET EVERY DAY 90 tablet 1  . Magnesium Oxide -Mg Supplement 250 MG TABS Take 250 mg by mouth daily.    . Meclizine HCl 25 MG CHEW Chew 25 mg by mouth daily as needed (for vertigo).     Marland Kitchen metformin (FORTAMET) 500 MG (OSM) 24 hr tablet TAKE 2 TABLETS TWICE DAILY WITH MEALS 360 tablet 1  . Multiple Vitamin (MULTIVITAMIN) tablet Take 1 tablet by mouth daily.    Marland Kitchen omeprazole (PRILOSEC) 20 MG capsule TAKE 1 CAPSULE EVERY DAY 90 capsule 1  . Potassium 99 MG TABS Take 1 tablet by mouth daily.    Marland Kitchen senna-docusate (SENOKOT-S) 8.6-50 MG per tablet Take 1 tablet by mouth daily. 30 tablet 0  . sertraline (ZOLOFT) 50 MG tablet TAKE  1 TABLET EVERY DAY 90 tablet 2  . verapamil (VERELAN PM) 180 MG 24 hr capsule TAKE 1 CAPSULE EVERY DAY 90 capsule 1  . azithromycin (ZITHROMAX) 250 MG tablet Take 2 tablets by mouth on day 1, take 1 tablet by mouth each day after for 4 days. (Patient not taking: Reported on 03/03/2015) 6 each 0  . ondansetron (ZOFRAN-ODT) 4 MG disintegrating tablet Take 4 mg by mouth every 8 (eight) hours as needed. Nausea and vomiting     No facility-administered medications prior to visit.    Review of Systems;  Patient denies headache, fevers, malaise, unintentional weight loss, skin rash, eye pain, sinus congestion and sinus pain, sore throat, dysphagia,  hemoptysis , cough, dyspnea, wheezing, chest pain, palpitations, orthopnea, edema, abdominal pain, nausea, melena, diarrhea, constipation, flank pain, dysuria, hematuria, urinary   Frequency, nocturia, numbness, tingling, seizures,  Focal weakness, Loss of consciousness,  Tremor, insomnia, depression, anxiety, and suicidal ideation.      Objective:  BP 122/72 mmHg  Pulse 72  Temp(Src) 98 F (36.7 C) (Oral)  Resp 12  Ht 5\' 1"  (1.549 m)  Wt 177 lb 8 oz (80.513 kg)  BMI 33.56 kg/m2  SpO2 93%  BP Readings from Last 3 Encounters:  06/05/15 122/72  03/03/15 116/72  02/24/15 131/75    Wt Readings from Last 3 Encounters:  06/05/15 177 lb 8 oz (80.513 kg)  03/03/15 178 lb 6.4 oz (80.922 kg)  02/24/15 183 lb 12.8 oz (83.371 kg)    General appearance: alert, cooperative and appears stated age Ears: normal TM's and external ear canals both ears Throat: lips, mucosa, and tongue normal; teeth and gums normal Neck: no adenopathy, no carotid bruit, supple, symmetrical, trachea midline and thyroid not enlarged, symmetric, no tenderness/mass/nodules Back: symmetric, no curvature. ROM normal. No CVA tenderness. Lungs: clear to auscultation bilaterally Heart: regular rate and rhythm, S1, S2 normal, no murmur, click, rub or gallop Abdomen: soft, non-tender; bowel sounds normal; no masses,  no organomegaly Pulses: 2+ and symmetric Skin: Skin color, texture, turgor normal. No rashes or lesions Lymph nodes: Cervical, supraclavicular, and axillary nodes normal.  Lab Results  Component Value Date   HGBA1C 7.0* 05/25/2015   HGBA1C 6.7* 02/16/2015   HGBA1C 6.9* 11/07/2014    Lab Results  Component Value Date   CREATININE 1.09 05/25/2015   CREATININE 0.94 02/24/2015   CREATININE 0.91 02/23/2015    Lab Results  Component Value Date   WBC 5.0 02/24/2015   HGB 11.3* 02/24/2015   HCT 34.6* 02/24/2015   PLT 281 02/24/2015   GLUCOSE 137* 05/25/2015   CHOL 200 05/25/2015   TRIG 97.0 05/25/2015   HDL 78.50 05/25/2015   LDLDIRECT 89.0 05/25/2015   LDLCALC 102* 05/25/2015   ALT 15 05/25/2015   AST 18 05/25/2015   NA 139 05/25/2015   K 4.0 05/25/2015   CL 105  05/25/2015   CREATININE 1.09 05/25/2015   BUN 27* 05/25/2015   CO2 23 05/25/2015   TSH 2.69 11/07/2014   INR 1.1 01/23/2009   HGBA1C 7.0* 05/25/2015   MICROALBUR 0.7 02/16/2015    Ct Abdomen Pelvis W Contrast  02/21/2015   CLINICAL DATA:  Rotator cuff surgery on Tuesday, now feeling constipated with vomiting.  EXAM: CT ABDOMEN AND PELVIS WITH CONTRAST  TECHNIQUE: Multidetector CT imaging of the abdomen and pelvis was performed using the standard protocol following bolus administration of intravenous contrast.  CONTRAST:  146mL OMNIPAQUE IOHEXOL 300 MG/ML  SOLN  COMPARISON:  None.  FINDINGS:  BODY WALL: No contributory findings.  LOWER CHEST: Bibasilar atelectasis.  Small hiatal hernia with large gastroesophageal reflux.  ABDOMEN/PELVIS:  Liver: No focal abnormality.  Biliary: Cholecystectomy.  No bile duct enlargement  Pancreas: Unremarkable.  Spleen: Unremarkable.  Adrenals: Unremarkable.  Kidneys and ureters: No hydronephrosis or stone.  Bladder: Unremarkable.  Reproductive: 3.4 cm simple appearing cyst in the left ovary.  Bowel: There is dilated fluid-filled proximal small bowel which gradually decreases in caliber without a discrete transition point. The colon is not decompressed, but rather diffusely contains stool. No indication of bowel necrosis or perforation. There is only mild interloop fluid in the central mesenteries. Normal appendix.  Retroperitoneum: No mass or adenopathy.  Peritoneum: No ascites or pneumoperitoneum.  Vascular: No acute abnormality.  OSSEOUS: No acute abnormalities.  IMPRESSION: 1. Dilated small bowel. Lack of transition point favors reactive ileus over partial obstruction. 2. 34 mm left ovarian cyst. Recommend 1 year followup pelvic ultrasound.   Electronically Signed   By: Monte Fantasia M.D.   On: 02/21/2015 04:18    Assessment & Plan:   Problem List Items Addressed This Visit      Unprioritized   DM (diabetes mellitus), type 2 with renal complications    Slight  loss of control discussed.  Diet reviewed.  Advised  to take glipizide before lunch or dinner only,  Continue januvia 25 mg.  Low carb snacks and meal substitutions discussed to avoid fasting hypoglycemia.       Essential hypertension    Well controlled on current regimen. Renal function stable, no changes today.  Lab Results  Component Value Date   CREATININE 1.09 05/25/2015   Lab Results  Component Value Date   NA 139 05/25/2015   K 4.0 05/25/2015   CL 105 05/25/2015   CO2 23 05/25/2015         Relevant Medications   furosemide (LASIX) 20 MG tablet   Hyperlipidemia LDL goal <100    LDL and triglycerides are at goal on atorvastatin. SHe has no side effects and liver enzymes are normal. No changes today    Lab Results  Component Value Date   CHOL 200 05/25/2015   HDL 78.50 05/25/2015   LDLCALC 102* 05/25/2015   LDLDIRECT 89.0 05/25/2015   TRIG 97.0 05/25/2015   CHOLHDL 3 05/25/2015   Lab Results  Component Value Date   ALT 15 05/25/2015   AST 18 05/25/2015   ALKPHOS 116 05/25/2015   BILITOT 0.3 05/25/2015         Relevant Medications   furosemide (LASIX) 20 MG tablet   Obesity    I have congratulated her in reduction of   BMI and encouraged  Continued weight loss with goal of 10% of body weigh over the next 6 months using a low glycemic index diet and regular exercise a minimum of 5 days per week.         Other Visit Diagnoses    Encounter for immunization    -  Primary      A total of 25 minutes of face to face time was spent with patient more than half of which was spent in counselling about the above mentioned conditions  and coordination of care  I have discontinued Ms. Posner's azithromycin and ondansetron. I am also having her maintain her Cholecalciferol (D3-1000 PO), BIOTIN PO, multivitamin, Calcium Carbonate-Vitamin D (CALCIUM 600 + D PO), Potassium, fish oil-omega-3 fatty acids, Black Cohosh, Meclizine HCl, albuterol, azelastine, fluticasone,  accu-chek softclix, atorvastatin, sertraline, lisinopril,  aspirin EC, ACCU-CHEK AVIVA PLUS, Magnesium Oxide -Mg Supplement, senna-docusate, JANUVIA, verapamil, glipiZIDE, metformin, omeprazole, levofloxacin, and furosemide.  Meds ordered this encounter  Medications  . levofloxacin (LEVAQUIN) 500 MG tablet    Sig: Take 1 tablet by mouth daily.  . furosemide (LASIX) 20 MG tablet    Sig: Take 20 mg by mouth every other day.    Medications Discontinued During This Encounter  Medication Reason  . azithromycin (ZITHROMAX) 250 MG tablet Completed Course  . ondansetron (ZOFRAN-ODT) 4 MG disintegrating tablet Completed Course    Follow-up: Return in about 3 months (around 09/04/2015) for follow up diabetes.   Crecencio Mc, MD

## 2015-06-05 NOTE — Progress Notes (Signed)
Pre-visit discussion using our clinic review tool. No additional management support is needed unless otherwise documented below in the visit note.  

## 2015-06-05 NOTE — Patient Instructions (Addendum)
Your A1c has increased to 7.0 from 6.7  We are goin gto try to get your fasting sugars to < 130 and avoid lunchtime lows by changing WHEN you take your glipizide  Try taking the glipizide XL before dinner  And continue 25 mg januvia in the morning   Call in two weeks of no improvement

## 2015-06-07 ENCOUNTER — Encounter: Payer: Self-pay | Admitting: Internal Medicine

## 2015-06-07 NOTE — Assessment & Plan Note (Signed)
I have congratulated her in reduction of   BMI and encouraged  Continued weight loss with goal of 10% of body weigh over the next 6 months using a low glycemic index diet and regular exercise a minimum of 5 days per week.    

## 2015-06-07 NOTE — Assessment & Plan Note (Signed)
Slight loss of control discussed.  Diet reviewed.  Advised  to take glipizide before lunch or dinner only,  Continue januvia 25 mg.  Low carb snacks and meal substitutions discussed to avoid fasting hypoglycemia.

## 2015-06-07 NOTE — Assessment & Plan Note (Signed)
LDL and triglycerides are at goal on atorvastatin. SHe has no side effects and liver enzymes are normal. No changes today    Lab Results  Component Value Date   CHOL 200 05/25/2015   HDL 78.50 05/25/2015   LDLCALC 102* 05/25/2015   LDLDIRECT 89.0 05/25/2015   TRIG 97.0 05/25/2015   CHOLHDL 3 05/25/2015   Lab Results  Component Value Date   ALT 15 05/25/2015   AST 18 05/25/2015   ALKPHOS 116 05/25/2015   BILITOT 0.3 05/25/2015

## 2015-06-07 NOTE — Assessment & Plan Note (Signed)
Well controlled on current regimen. Renal function stable, no changes today.  Lab Results  Component Value Date   CREATININE 1.09 05/25/2015   Lab Results  Component Value Date   NA 139 05/25/2015   K 4.0 05/25/2015   CL 105 05/25/2015   CO2 23 05/25/2015

## 2015-06-08 ENCOUNTER — Ambulatory Visit: Payer: Medicare PPO | Attending: Orthopedic Surgery

## 2015-06-08 DIAGNOSIS — Z9889 Other specified postprocedural states: Secondary | ICD-10-CM

## 2015-06-08 DIAGNOSIS — Z4789 Encounter for other orthopedic aftercare: Secondary | ICD-10-CM | POA: Diagnosis not present

## 2015-06-08 DIAGNOSIS — M25511 Pain in right shoulder: Secondary | ICD-10-CM | POA: Insufficient documentation

## 2015-06-08 DIAGNOSIS — R29898 Other symptoms and signs involving the musculoskeletal system: Secondary | ICD-10-CM | POA: Diagnosis not present

## 2015-06-08 NOTE — Therapy (Signed)
[facility] [address], [place], [address] Phone: [phone]   Fax:  [phone]  Physical Therapy Treatment  Patient Details  Name: Sandra Hendrix MRN: [id number] Date of Birth: 22-Jan-1946 Referring Provider:  Justice Britain, MD   Encounter Date: 06/08/2015       PT End of Session - 06/08/15 1040    Visit Number 16   Number of Visits 29   Date for PT Re-Evaluation 07/09/15   Authorization Type 7   Authorization Time Period 10   Authorization - Visit Number 1   Authorization - Number of Visits 1   PT Start Time 3875   PT Stop Time 6433   PT Time Calculation (min) 52 min   Activity Tolerance Patient tolerated treatment well;No increased pain   Behavior During Therapy [facility] for tasks assessed/performed      Past Medical History  Diagnosis Date  . Diabetes mellitus without complication   . Hyperlipidemia   . Hypertension   . Arthritis   . Depression   . GERD (gastroesophageal reflux disease)   . Allergy   . Urinary incontinence   . Varicose veins     Past Surgical History  Procedure Laterality Date  . Joint replacement Right 2007  . Joint replacement Left 2004  . Rotator cuff repair Right     There were no vitals filed for this visit.  Visit Diagnosis:  S/P rotator cuff repair - Plan: PT plan of care cert/re-cert  Orthopedic aftercare - Plan: PT plan of care cert/re-cert  Weakness of shoulder - Plan: PT plan of care cert/re-cert  Pain in joint, shoulder region, right - Plan: PT plan of care cert/re-cert      Subjective Assessment - 06/08/15 1041    Subjective Pt states that her R shoulder is doing good. No pain currently. Uncomfortable bringing R arm to the side. Feels like her R shoulder is not stretching like its supposed to yet when raising her R arm.  Also has a difficult time reaching behind her back to don and doff clothing such as her bra.    Patient Stated Goals To be able  to use her R shoulder with no pain.    Currently in Pain? No/denies   Multiple Pain Sites No            [facility] PT Assessment - 06/08/15 1043    AROM   Overall AROM Comments AROM R shoulder: flexion 138 degrees, scaption 120 degrees, abduction 118 degrees with slight anterior shoulder discomfort, ER 80 degree (with slight R shoulder discomfort), IR 70 degrees  discomfort eases with rest.    Strength   Overall Strength Comments R UE: Lower trap 4-/5, rhomboids 3+/5.    Right Shoulder Flexion 4/5  with shoulder soreness   Right Shoulder ABduction 4-/5   Right Shoulder Internal Rotation 4+/5   Right Shoulder External Rotation 4/5   Left Shoulder Flexion 4/5  with pain   Left Shoulder ABduction 4/5  with shoulder pain   Left Shoulder Internal Rotation 4+/5   Left Shoulder External Rotation 4+/5           [facility] Adult PT Treatment/Exercise - 06/08/15 1115    Exercises   Other Exercises  Directed patient with prone lower trap raise 10x2, prone rhomboid rase 10x2, S/L R shoudler ER 1 lb R 10x2, standing R shoulder scaption 1 lb 5x2, seated bilateral shoulder ER with yellow band, R shoulder abduction to 90  degrees 1 lb weight 5x3.     Directed patient with R shoulder flexion, abduction, scaption, ER, IR AROM 1x each way, and then with manual resistance 1x each way. Reviewed current status with R shoulder strength with pt. Directed patient with behind the back towel stretch to promote functional IR to help don and doff clothes.             PT Education - 06/08/15 1731    Education provided Yes   Education Details ther-ex, HEP   Person(s) Educated Patient   Methods Explanation;Demonstration;Tactile cues;Verbal cues   Comprehension Verbalized understanding;Returned demonstration             PT Long Term Goals - 06/08/15 1733    PT LONG TERM GOAL #1   Title Patient will be independent with her HEP to improve PROM, A/AROM and ability to raise her R arm when appropriate.     Time 4   Period Weeks   Status On-going   PT LONG TERM GOAL #2   Title Patient will improve her Quick Dash score by at least 14% as a demonstration of improved function.   Time 4   Period Weeks   Status Achieved   PT LONG TERM GOAL #3   Title Patient will improve R shoulder flexion, abduction PROM to at least 125 degrees to promote ability to raise her R arm.    Baseline Able to complete this actively 05/25/2015.    Time 4   Period Weeks   Status Achieved   PT LONG TERM GOAL #4   Title Patient will improve R shoulder flexion and abduction AAROM to at least 110 degrees to promote ability to raise her R arm when appropriate.    Time 4   Period Weeks   Status Achieved   PT LONG TERM GOAL #5   Title Patient will improve R shouler ER to 60 degrees AROM and IR to at least 60 degrees AROM when appropriate to promote ability to groom, and don and doff clothes.    Time 4   Period Weeks   Status Achieved   Additional Long Term Goals   Additional Long Term Goals Yes   PT LONG TERM GOAL #6   Title Patient will improve her Quick Dash score to 27% or less as a demonstration of improved function.   Baseline 11% reported on 05/25/2015.    Time 4   Period Weeks   Status Achieved   PT LONG TERM GOAL #7   Title Patient will improve R shoulder flexion and abduction/scaption AROM to 90 degress to promote ability to raise her R arm.    Time 4   Period Weeks   Status Achieved   PT LONG TERM GOAL #8   Title Patient will improve R shoulder ER strength to at least 4/5 to promote ability to raise her R arm actively while promoting proper glenohumeral control to protect surgery.    Time 4   Period Weeks   Status Achieved   PT LONG TERM GOAL  #9   TITLE Patient will improve R shoulder flexion and ER strength to 4+/5, and abduction strength to 4/5 to promote ability to raise her R arm and use it for functional tasks.    Time 4   Period Weeks   Status New   PT LONG TERM GOAL  #10   TITLE Pt will  report decreased difficulty reaching behind her back to promote ability to don and doff clothing such as  a bra.   Time 4   Period Weeks   Status New   PT LONG TERM GOAL  #11   TITLE Patient will improve R shoulder flexion to at least 145 degrees AROM and scaption to at least 135 degrees AROM to promote ability to reach.    Time 4   Period Weeks   Status New               Plan - 06/08/15 1712    Clinical Impression Statement Patient demonstrates very good progress with  R shoulder AROM and her Quick Dash Score (11.4% on 05/25/15 suggesting improved function) since initial evaluation and has recently started working on improving strength in her R shoulder.  She also still presents with R shoulder weakness,  difficulty reaching behind her back to don and doff clothing items such as a bra as well as raising her arm up to the side secondary to some discomfort.  No discomfort at rest. Patient will benefit from continued  skilled physical therapy services to address the aforementioned defictis and to continue progress with her rehabilitation from her surgery to improve function while protecting her repair.    Pt will benefit from skilled therapeutic intervention in order to improve on the following deficits Decreased range of motion;Pain;Improper body mechanics;Decreased strength   Rehab Potential Good   PT Frequency 2x / week   PT Duration 4 weeks   PT Treatment/Interventions Therapeutic exercise;Manual techniques;Therapeutic activities;Moist Heat;Electrical Stimulation;Cryotherapy;ADLs/Self Care Home Management   PT Next Visit Plan Continue and progress strengthening program initiated this session.         Per PA on 05/25/15, pt good to begin strengthening.    Consulted and Agree with Plan of Care Patient        Problem List Patient Active Problem List   Diagnosis Date Noted  . Mass of left breast 03/04/2015  . Ileus, postoperative 02/21/2015  . CKD (chronic kidney disease) stage 3,  GFR 30-59 ml/min 11/07/2014  . UTI (urinary tract infection) 02/08/2014  . Acid reflux 12/26/2013  . Irritable bowel syndrome with constipation 12/26/2013  . Welcome to Medicare preventive visit 10/21/2013  . Hyperlipidemia LDL goal <100 07/15/2013  . Obesity 07/15/2013  . Chronic venous insufficiency 03/21/2013  . DM (diabetes mellitus), type 2 with renal complications 76/81/1572  . Lack of libido 11/05/2012  . Barrett's esophagus 11/05/2012  . Hiatal hernia 11/05/2012  . Essential hypertension 11/05/2012  . Urinary incontinence     Thank you for your referral.   Joneen Boers PT, DPT   06/08/2015, 5:58 PM  [facility] [address] [place], [address] Phone: [phone]   Fax:  [phone]

## 2015-06-08 NOTE — Patient Instructions (Addendum)
Hold towel behind back with both hands. Pull with L hand to help bring R hand behind back comfortably.  Hold for 5 seconds. Repeat 10 times. Do 2 sets. Pain-free range.   Improved exercise technique, movement at target joints, use of target muscles after mod verbal, visual, tactile cues.

## 2015-06-17 ENCOUNTER — Ambulatory Visit: Payer: Medicare PPO

## 2015-06-17 DIAGNOSIS — M25511 Pain in right shoulder: Secondary | ICD-10-CM

## 2015-06-17 DIAGNOSIS — Z4789 Encounter for other orthopedic aftercare: Secondary | ICD-10-CM

## 2015-06-17 DIAGNOSIS — R29898 Other symptoms and signs involving the musculoskeletal system: Secondary | ICD-10-CM

## 2015-06-17 DIAGNOSIS — Z9889 Other specified postprocedural states: Secondary | ICD-10-CM | POA: Diagnosis not present

## 2015-06-17 DIAGNOSIS — M7542 Impingement syndrome of left shoulder: Secondary | ICD-10-CM | POA: Diagnosis not present

## 2015-06-17 NOTE — Patient Instructions (Addendum)
On your stomach, right arm off bed, bring shoulder blade back:     -   Raise your arm forward diagonally with thumbs up 10 times for 2 sets.      -    Raise your arm to the side, palms down 10 times for 2 sets.    You can work towards 3 sets of 10.   Improved exercise technique, movement at target joints, use of target muscles after min verbal, visual, tactile cues.

## 2015-06-17 NOTE — Therapy (Signed)
[facility] [address], [place], [address] Phone: [phone]   Fax:  [phone]  Physical Therapy Treatment and Discharge Summary  Patient Details   Name: Sandra Hendrix MRN: [id number] Date of Birth: [birth year] Referring Ronni Osterberg:  Justice Britain, MD  Encounter Date: 06/17/2015      PT End of Session - 06/17/15 1358    Visit Number 17   Number of Visits 29   Date for PT Re-Evaluation 07/09/15   Authorization Type 8   Authorization Time Period 10   Authorization - Visit Number 1   PT Start Time 0354   PT Stop Time 6568   PT Time Calculation (min) 44 min   Activity Tolerance Patient tolerated treatment well;No increased pain   Behavior During Therapy [facility] for tasks assessed/performed      Past Medical History  Diagnosis Date  . Diabetes mellitus without complication   . Hyperlipidemia   . Hypertension   . Arthritis   . Depression   . GERD (gastroesophageal reflux disease)   . Allergy   . Urinary incontinence   . Varicose veins     Past Surgical History  Procedure Laterality Date  . Joint replacement Right 2007  . Joint replacement Left 2004  . Rotator cuff repair Right     There were no vitals filed for this visit.  Visit Diagnosis:  S/P rotator cuff repair  Orthopedic aftercare  Weakness of shoulder  Pain in joint, shoulder region, right      Subjective Assessment - 06/17/15 1400    Subjective Pt states that she just got back from Dr. Susie Cassette office who said that her R shoulder is doing well and does not need anymore physical therapy. L shoulder is the one giving her the problems. Had a shot on L shoulder today. Feels good about today being her last day for physical therapy.  Better able to place dishes with R hand into her cup board.    Patient Stated Goals To be able to use her R shoulder with no pain.    Currently in Pain? No/denies   Multiple Pain Sites No      Objectives: There-ex:     Directed patient with prone lower trap raise 10x2,   prone rhomboid raise 10x2,   Towel stretch to promote R shoulder IR 10x10 seconds for 2 sets,  standing R shoulder scaption 1 lb 5x2,   bilateral shoulder ER with yellow band,   R shoulder abduction to 90 degrees 1 lb weight 5x3,   Standing R shoulder extension with scapular retraction resisting yellow band 10x3.    Directed patient with R shoulder flexion, scaption, ER, IR AROM 1x each way, and then with manual resistance 1x each way.  Reviewed current status with R shoulder strength with pt.                         PT Education - 06/17/15 1407    Education provided Yes   Education Details ther-ex, HEP   Person(s) Educated Patient   Methods Explanation;Demonstration;Tactile cues;Verbal cues   Comprehension Verbalized understanding;Returned demonstration             PT Long Term Goals - 06/17/15 1802    PT LONG TERM GOAL #1   Title Patient will be independent with her HEP to improve PROM, A/AROM and ability to raise her R arm when appropriate.    Time 4  Period Weeks   Status Achieved   PT LONG TERM GOAL #2   Title Patient will improve her Quick Dash score by at least 14% as a demonstration of improved function.   Time 4   Period Weeks   Status Achieved   PT LONG TERM GOAL #3   Title Patient will improve R shoulder flexion, abduction PROM to at least 125 degrees to promote ability to raise her R arm.    Baseline Able to complete this actively 05/25/2015.    Time 4   Period Weeks   Status Achieved   PT LONG TERM GOAL #4   Title Patient will improve R shoulder flexion and abduction AAROM to at least 110 degrees to promote ability to raise her R arm when appropriate.    Time 4   Period Weeks   Status Achieved   PT LONG TERM GOAL #5   Title Patient will improve R shouler ER to 60 degrees AROM and IR to at least 60 degrees AROM when appropriate to promote  ability to groom, and don and doff clothes.    Time 4   Period Weeks   Status Achieved   PT LONG TERM GOAL #6   Title Patient will improve her Quick Dash score to 27% or less as a demonstration of improved function.   Baseline 11% reported on 05/25/2015.    Time 4   Period Weeks   Status Achieved   PT LONG TERM GOAL #7   Title Patient will improve R shoulder flexion and abduction/scaption AROM to 90 degress to promote ability to raise her R arm.    Time 4   Period Weeks   Status Achieved   PT LONG TERM GOAL #8   Title Patient will improve R shoulder ER strength to at least 4/5 to promote ability to raise her R arm actively while promoting proper glenohumeral control to protect surgery.    Time 4   Period Weeks   Status Achieved   PT LONG TERM GOAL  #9   TITLE Patient will improve R shoulder flexion and ER strength to 4+/5, and abduction strength to 4/5 to promote ability to raise her R arm and use it for functional tasks.    Time 4   Period Weeks   Status Partially Met   PT LONG TERM GOAL  #10   TITLE Pt will report decreased difficulty reaching behind her back to promote ability to don and doff clothing such as a bra.   Time 4   Period Weeks   Status On-going   PT LONG TERM GOAL  #11   TITLE Patient will improve R shoulder flexion to at least 145 degrees AROM and scaption to at least 135 degrees AROM to promote ability to reach.    Time 4   Period Weeks   Status Achieved               Plan - 06/17/15 1407    Clinical Impression Statement Patient has demonstrated improved R shoulder flexion, scaption, and ER AROM, and R shoulder abduction strength since last session. Patient has demonstrated very good progress towards goals and function. Pt also reports no R shoulder pain. Skilled physical therapy services discharged with patient continuing progress with her home exercise program.    Rehab Potential Good   Clinical Impairments Affecting Rehab Potential none   PT Next  Visit Plan Patient has made very good progress with physical therapy and can continue with her home exercise  program.    Consulted and Agree with Plan of Care Patient          G-Codes - 07-08-2015 1805    Functional Assessment Tool Used Katina Dung, pt interview, clinical presentation   Functional Limitation Carrying, moving and handling objects   Carrying, Moving and Handling Objects Goal Status [phone]) 0 percent impaired, limited or restricted   Carrying, Moving and Handling Objects Discharge Status [phone]) At least 1 percent but less than 20 percent impaired, limited or restricted      Problem List Patient Active Problem List   Diagnosis Date Noted  . Mass of left breast 03/04/2015  . Ileus, postoperative 02/21/2015  . CKD (chronic kidney disease) stage 3, GFR 30-59 ml/min 11/07/2014  . UTI (urinary tract infection) 02/08/2014  . Acid reflux 12/26/2013  . Irritable bowel syndrome with constipation 12/26/2013  . Welcome to Medicare preventive visit 10/21/2013  . Hyperlipidemia LDL goal <100 07/15/2013  . Obesity 07/15/2013  . Chronic venous insufficiency 03/21/2013  . DM (diabetes mellitus), type 2 with renal complications 57/32/2567  . Lack of libido 11/05/2012  . Barrett's esophagus 11/05/2012  . Hiatal hernia 11/05/2012  . Essential hypertension 11/05/2012  . Urinary incontinence     Thank you for your referral.  Joneen Boers PT, DPT   07-08-2015, 6:16 PM  [facility] [address], [place], [address] Phone: [phone]   Fax:  [phone]

## 2015-07-01 ENCOUNTER — Ambulatory Visit: Payer: Medicare PPO

## 2015-07-08 DIAGNOSIS — N390 Urinary tract infection, site not specified: Secondary | ICD-10-CM | POA: Diagnosis not present

## 2015-07-08 DIAGNOSIS — I10 Essential (primary) hypertension: Secondary | ICD-10-CM | POA: Diagnosis not present

## 2015-07-08 DIAGNOSIS — N183 Chronic kidney disease, stage 3 (moderate): Secondary | ICD-10-CM | POA: Diagnosis not present

## 2015-07-11 LAB — HM DIABETES EYE EXAM

## 2015-07-30 DIAGNOSIS — E1140 Type 2 diabetes mellitus with diabetic neuropathy, unspecified: Secondary | ICD-10-CM | POA: Diagnosis not present

## 2015-07-30 DIAGNOSIS — R600 Localized edema: Secondary | ICD-10-CM | POA: Diagnosis not present

## 2015-07-30 DIAGNOSIS — I10 Essential (primary) hypertension: Secondary | ICD-10-CM | POA: Diagnosis not present

## 2015-07-30 DIAGNOSIS — N183 Chronic kidney disease, stage 3 (moderate): Secondary | ICD-10-CM | POA: Diagnosis not present

## 2015-07-31 DIAGNOSIS — E1140 Type 2 diabetes mellitus with diabetic neuropathy, unspecified: Secondary | ICD-10-CM | POA: Diagnosis not present

## 2015-07-31 DIAGNOSIS — I10 Essential (primary) hypertension: Secondary | ICD-10-CM | POA: Diagnosis not present

## 2015-07-31 DIAGNOSIS — N183 Chronic kidney disease, stage 3 (moderate): Secondary | ICD-10-CM | POA: Diagnosis not present

## 2015-08-11 ENCOUNTER — Other Ambulatory Visit: Payer: Self-pay | Admitting: Internal Medicine

## 2015-08-11 MED ORDER — GLIPIZIDE ER 10 MG PO TB24: 10.0000 mg | ORAL_TABLET | Freq: Every day | ORAL | Status: DC

## 2015-08-13 ENCOUNTER — Other Ambulatory Visit: Payer: Self-pay

## 2015-08-13 ENCOUNTER — Telehealth: Payer: Self-pay | Admitting: Internal Medicine

## 2015-08-13 ENCOUNTER — Telehealth: Payer: Self-pay

## 2015-08-13 MED ORDER — ATORVASTATIN CALCIUM 40 MG PO TABS: 40.0000 mg | ORAL_TABLET | Freq: Every day | ORAL | Status: DC

## 2015-08-13 MED ORDER — GLIPIZIDE ER 10 MG PO TB24: 10.0000 mg | ORAL_TABLET | Freq: Every day | ORAL | Status: DC

## 2015-08-13 NOTE — Telephone Encounter (Signed)
Pt called about her medication prescription not at the pharmacy. atorvastatin (LIPITOR) 40 MG tablet and glipiZIDE (GLUCOTROL XL) 10 MG 24 hr tablet . Pharmacy is [facility] [place], [place]. Pt prescription was sent to [facility]. Thank You!

## 2015-08-13 NOTE — Telephone Encounter (Signed)
SENT IN RX TO LOCAL PHARMACY

## 2015-08-15 DIAGNOSIS — J069 Acute upper respiratory infection, unspecified: Secondary | ICD-10-CM | POA: Diagnosis not present

## 2015-09-08 ENCOUNTER — Telehealth: Payer: Self-pay | Admitting: *Deleted

## 2015-09-08 ENCOUNTER — Other Ambulatory Visit (INDEPENDENT_AMBULATORY_CARE_PROVIDER_SITE_OTHER): Payer: Medicare PPO

## 2015-09-08 DIAGNOSIS — E1129 Type 2 diabetes mellitus with other diabetic kidney complication: Secondary | ICD-10-CM

## 2015-09-08 LAB — COMPREHENSIVE METABOLIC PANEL
ALT: 17 U/L (ref 0–35)
AST: 18 U/L (ref 0–37)
Albumin: 4.1 g/dL (ref 3.5–5.2)
Alkaline Phosphatase: 88 U/L (ref 39–117)
BUN: 18 mg/dL (ref 6–23)
CO2: 29 mEq/L (ref 19–32)
Calcium: 9.4 mg/dL (ref 8.4–10.5)
Chloride: 102 mEq/L (ref 96–112)
Creatinine, Ser: 0.98 mg/dL (ref 0.40–1.20)
GFR: 59.39 mL/min — ABNORMAL LOW (ref >60.00)
Glucose, Bld: 178 mg/dL — ABNORMAL HIGH (ref 70–99)
Potassium: 4.3 mEq/L (ref 3.5–5.1)
Sodium: 141 mEq/L (ref 135–145)
Total Bilirubin: 0.5 mg/dL (ref 0.2–1.2)
Total Protein: 6.7 g/dL (ref 6.0–8.3)

## 2015-09-08 LAB — HEMOGLOBIN A1C: Hgb A1c MFr Bld: 7.9 % — ABNORMAL HIGH (ref 4.6–6.5)

## 2015-09-08 LAB — LIPID PANEL
Cholesterol: 191 mg/dL (ref 0–200)
HDL: 79.4 mg/dL (ref >39.00)
LDL Cholesterol: 82 mg/dL (ref 0–99)
NonHDL: 111.32
Total CHOL/HDL Ratio: 2
Triglycerides: 146 mg/dL (ref 0.0–149.0)
VLDL: 29.2 mg/dL (ref 0.0–40.0)

## 2015-09-08 LAB — LDL CHOLESTEROL, DIRECT: Direct LDL: 82 mg/dL

## 2015-09-08 NOTE — Telephone Encounter (Signed)
Labs and dx?  

## 2015-09-10 ENCOUNTER — Ambulatory Visit (INDEPENDENT_AMBULATORY_CARE_PROVIDER_SITE_OTHER): Payer: Medicare PPO | Admitting: Internal Medicine

## 2015-09-10 ENCOUNTER — Encounter: Payer: Self-pay | Admitting: Internal Medicine

## 2015-09-10 VITALS — BP 108/72 | HR 66 | Temp 97.6°F | Resp 12 | Ht 61.0 in | Wt 179.5 lb

## 2015-09-10 DIAGNOSIS — E1122 Type 2 diabetes mellitus with diabetic chronic kidney disease: Secondary | ICD-10-CM

## 2015-09-10 DIAGNOSIS — Z1239 Encounter for other screening for malignant neoplasm of breast: Secondary | ICD-10-CM | POA: Diagnosis not present

## 2015-09-10 DIAGNOSIS — M21621 Bunionette of right foot: Secondary | ICD-10-CM | POA: Diagnosis not present

## 2015-09-10 DIAGNOSIS — N181 Chronic kidney disease, stage 1: Secondary | ICD-10-CM

## 2015-09-10 DIAGNOSIS — E669 Obesity, unspecified: Secondary | ICD-10-CM

## 2015-09-10 MED ORDER — SITAGLIPTIN PHOSPHATE 50 MG PO TABS: 50.0000 mg | ORAL_TABLET | Freq: Every day | ORAL | Status: DC

## 2015-09-10 NOTE — Progress Notes (Signed)
Subjective:  Patient ID: Sandra Hendrix, female    DOB: 21-Apr-1946  Age: 69 y.o. MRN: [id number]  CC: The primary encounter diagnosis was Bunionette of right foot. Diagnoses of Breast cancer screening and Type 2 diabetes mellitus with stage 1 chronic kidney disease, unspecified long term insulin use status ([place]) were also pertinent to this visit.  [name] presents for FOLLOW UP ON TYPE 2 dm  She has lost control of DM with A1C NOW 7.9  Has not been able to lose weight either .  Eye exam was done by [facility],  Oct 16  Bs fasting have been 150 to 180. Diet reviewed.  She has been eating out a lot : [employer]  (half sandwich, half salad, [employer]  (2 vegs and meat , sometimes okra )   Outpatient Prescriptions Prior to Visit  Medication Sig Dispense Refill  . ACCU-CHEK AVIVA PLUS test strip Apply 1 each topically daily.    Marland Kitchen albuterol (PROVENTIL HFA;VENTOLIN HFA) 108 (90 BASE) MCG/ACT inhaler Inhale 2 puffs into the lungs every 6 (six) hours as needed for wheezing.    Marland Kitchen aspirin EC 81 MG tablet Take 81 mg by mouth daily.    Marland Kitchen atorvastatin (LIPITOR) 40 MG tablet Take 1 tablet (40 mg total) by mouth daily at 6 PM. 30 tablet 1  . azelastine (ASTEPRO) 137 MCG/SPRAY nasal spray Place 1 spray into the nose 2 (two) times daily. Use in each nostril as directed (Patient taking differently: Place 1 spray into the nose daily. Use in each nostril as directed) 30 mL 11  . BIOTIN PO Take by mouth. Take 1000mg  by mouth twice daily.    . Black Cohosh (REMIFEMIN) 20 MG TABS Take 20 mg by mouth 2 (two) times daily.     . Calcium Carbonate-Vitamin D (CALCIUM 600 + D PO) Take 1 tablet by mouth daily.    . Cholecalciferol (D3-1000 PO) Take 1,000 Int'l Units by mouth every morning.     . fish oil-omega-3 fatty acids 1000 MG capsule Take 1 capsule by mouth daily.    . fluticasone (FLONASE) 50 MCG/ACT nasal spray Place 1 spray into both nostrils daily. 48 g 1  . furosemide (LASIX) 20 MG tablet Take  20 mg by mouth every other day.    Marland Kitchen glipiZIDE (GLUCOTROL XL) 10 MG 24 hr tablet Take 1 tablet (10 mg total) by mouth daily. 30 tablet 1  . Lancet Devices (ACCU-CHEK SOFTCLIX) lancets Use to test blood sugars 1 -2 times daily 1 each 3  . lisinopril (PRINIVIL,ZESTRIL) 20 MG tablet TAKE 1 TABLET EVERY DAY 90 tablet 1  . Magnesium Oxide -Mg Supplement 250 MG TABS Take 250 mg by mouth daily.    . Meclizine HCl 25 MG CHEW Chew 25 mg by mouth daily as needed (for vertigo).     Marland Kitchen metformin (FORTAMET) 500 MG (OSM) 24 hr tablet TAKE 2 TABLETS TWICE DAILY WITH MEALS 360 tablet 1  . Multiple Vitamin (MULTIVITAMIN) tablet Take 1 tablet by mouth daily.    Marland Kitchen omeprazole (PRILOSEC) 20 MG capsule TAKE 1 CAPSULE EVERY DAY 90 capsule 1  . Potassium 99 MG TABS Take 1 tablet by mouth daily.    Marland Kitchen senna-docusate (SENOKOT-S) 8.6-50 MG per tablet Take 1 tablet by mouth daily. 30 tablet 0  . sertraline (ZOLOFT) 50 MG tablet TAKE 1 TABLET EVERY DAY 90 tablet 2  . verapamil (VERELAN PM) 180 MG 24 hr capsule TAKE 1 CAPSULE EVERY  DAY 90 capsule 1  . JANUVIA 25 MG tablet TAKE 1 TABLET EVERY DAY 90 tablet 1  . levofloxacin (LEVAQUIN) 500 MG tablet Take 1 tablet by mouth daily.     No facility-administered medications prior to visit.    Review of Systems;  Patient denies headache, fevers, malaise, unintentional weight loss, skin rash, eye pain, sinus congestion and sinus pain, sore throat, dysphagia,  hemoptysis , cough, dyspnea, wheezing, chest pain, palpitations, orthopnea, edema, abdominal pain, nausea, melena, diarrhea, constipation, flank pain, dysuria, hematuria, urinary  Frequency, nocturia, numbness, tingling, seizures,  Focal weakness, Loss of consciousness,  Tremor, insomnia, depression, anxiety, and suicidal ideation.      Objective:  BP 108/72 mmHg  Pulse 66  Temp(Src) 97.6 F (36.4 C) (Oral)  Resp 12  Ht 5\' 1"  (1.549 m)  Wt 179 lb 8 oz (81.421 kg)  BMI 33.93 kg/m2  SpO2 94%  BP Readings from Last 3  Encounters:  09/10/15 108/72  06/05/15 122/72  03/03/15 116/72    Wt Readings from Last 3 Encounters:  09/10/15 179 lb 8 oz (81.421 kg)  06/05/15 177 lb 8 oz (80.513 kg)  03/03/15 178 lb 6.4 oz (80.922 kg)    General appearance: alert, cooperative and appears stated age Ears: normal TM's and external ear canals both ears Throat: lips, mucosa, and tongue normal; teeth and gums normal Neck: no adenopathy, no carotid bruit, supple, symmetrical, trachea midline and thyroid not enlarged, symmetric, no tenderness/mass/nodules Back: symmetric, no curvature. ROM normal. No CVA tenderness. Lungs: clear to auscultation bilaterally Heart: regular rate and rhythm, S1, S2 normal, no murmur, click, rub or gallop Abdomen: soft, non-tender; bowel sounds normal; no masses,  no organomegaly Pulses: 2+ and symmetric Skin: Skin color, texture, turgor normal. No rashes or lesions Lymph nodes: Cervical, supraclavicular, and axillary nodes normal.  Lab Results  Component Value Date   HGBA1C 7.9* 09/08/2015   HGBA1C 7.0* 05/25/2015   HGBA1C 6.7* 02/16/2015    Lab Results  Component Value Date   CREATININE 0.98 09/08/2015   CREATININE 1.09 05/25/2015   CREATININE 0.94 02/24/2015    Lab Results  Component Value Date   WBC 5.0 02/24/2015   HGB 11.3* 02/24/2015   HCT 34.6* 02/24/2015   PLT 281 02/24/2015   GLUCOSE 178* 09/08/2015   CHOL 191 09/08/2015   TRIG 146.0 09/08/2015   HDL 79.40 09/08/2015   LDLDIRECT 82.0 09/08/2015   LDLCALC 82 09/08/2015   ALT 17 09/08/2015   AST 18 09/08/2015   NA 141 09/08/2015   K 4.3 09/08/2015   CL 102 09/08/2015   CREATININE 0.98 09/08/2015   BUN 18 09/08/2015   CO2 29 09/08/2015   TSH 2.69 11/07/2014   INR 1.1 01/23/2009   HGBA1C 7.9* 09/08/2015   MICROALBUR 0.7 02/16/2015    Ct Abdomen Pelvis W Contrast  02/21/2015  CLINICAL DATA:  Rotator cuff surgery on Tuesday, now feeling constipated with vomiting. EXAM: CT ABDOMEN AND PELVIS WITH CONTRAST  TECHNIQUE: Multidetector CT imaging of the abdomen and pelvis was performed using the standard protocol following bolus administration of intravenous contrast. CONTRAST:  131mL OMNIPAQUE IOHEXOL 300 MG/ML  SOLN COMPARISON:  None. FINDINGS: BODY WALL: No contributory findings. LOWER CHEST: Bibasilar atelectasis. Small hiatal hernia with large gastroesophageal reflux. ABDOMEN/PELVIS: Liver: No focal abnormality. Biliary: Cholecystectomy.  No bile duct enlargement Pancreas: Unremarkable. Spleen: Unremarkable. Adrenals: Unremarkable. Kidneys and ureters: No hydronephrosis or stone. Bladder: Unremarkable. Reproductive: 3.4 cm simple appearing cyst in the left ovary. Bowel: There is dilated  fluid-filled proximal small bowel which gradually decreases in caliber without a discrete transition point. The colon is not decompressed, but rather diffusely contains stool. No indication of bowel necrosis or perforation. There is only mild interloop fluid in the central mesenteries. Normal appendix. Retroperitoneum: No mass or adenopathy. Peritoneum: No ascites or pneumoperitoneum. Vascular: No acute abnormality. OSSEOUS: No acute abnormalities. IMPRESSION: 1. Dilated small bowel. Lack of transition point favors reactive ileus over partial obstruction. 2. 34 mm left ovarian cyst. Recommend 1 year followup pelvic ultrasound. Electronically Signed   By: Monte Fantasia M.D.   On: 02/21/2015 04:18    Assessment & Plan:   Problem List Items Addressed This Visit    DM (diabetes mellitus), type 2 with renal complications ([place])    Loss of control discussed,  Diet reviewed and advice given.  increase Januvia to 50 mg daily, followed by increased to 100 mg daily in one month if BS not at goal       Relevant Medications   sitaGLIPtin (JANUVIA) 50 MG tablet    Other Visit Diagnoses    Bunionette of right foot    -  Primary    Relevant Orders    Ambulatory referral to Podiatry    Breast cancer screening        Relevant Orders     MM DIGITAL SCREENING BILATERAL      A total of 25 minutes of face to face time was spent with patient more than half of which was spent in counselling and coordination of care   I have discontinued Ms. Febres's levofloxacin. I have also changed her JANUVIA to sitaGLIPtin. Additionally, I am having her maintain her Cholecalciferol (D3-1000 PO), BIOTIN PO, multivitamin, Calcium Carbonate-Vitamin D (CALCIUM 600 + D PO), Potassium, fish oil-omega-3 fatty acids, Black Cohosh, Meclizine HCl, albuterol, azelastine, fluticasone, accu-chek softclix, sertraline, lisinopril, aspirin EC, ACCU-CHEK AVIVA PLUS, Magnesium Oxide -Mg Supplement, senna-docusate, verapamil, metformin, omeprazole, furosemide, atorvastatin, and glipiZIDE.  Meds ordered this encounter  Medications  . sitaGLIPtin (JANUVIA) 50 MG tablet    Sig: Take 1 tablet (50 mg total) by mouth daily.    Dispense:  90 tablet    Refill:  1    Medications Discontinued During This Encounter  Medication Reason  . levofloxacin (LEVAQUIN) 500 MG tablet Completed Course  . JANUVIA 25 MG tablet Reorder    Follow-up: Return in about 3 months (around 12/09/2015) for follow up diabetes.   Crecencio Mc, MD

## 2015-09-10 NOTE — Patient Instructions (Addendum)
Your diabetes is under  diminishing control on current regimen, and your a1c has increased to 7.7 (goal is <, 7.00).   please make sure you are following a low glycemic index diet and exercising daily (at least going for a walk) .      I am Increasing the januvia to 50 mg .  We may need to increase to 100 mg if after one month your fasting sugars are not < 130 consistently    Please Check your sugars once daily for the next two weeks at any of the following times:   fasting   Or postprandial (2 hr after a meal) at  Different times .  Use the sheet to record your sugars and your meal (if you checked it AFTER a meal) and drop it by so I can review them.     I want ou to add 15 minutes of  bicycling 5 days per week, Or 30 minutes 3 days per week   Morning is best   Do not skip breakfast.  This slows your metabolism down and raises your blood sugars. The following foods are low carb ,  High protein,  And convenient:  Premier Protein,  Atkins ,  EAS Advantedge   Protein shakes.  Just refirgerate and serve  Jimmy Dean's frittata is another quick alternative that can be microwaved in 2 minutes : it is basically quiche without the crust     Referral to Podiatry is under way

## 2015-09-10 NOTE — Progress Notes (Signed)
Pre-visit discussion using our clinic review tool. No additional management support is needed unless otherwise documented below in the visit note.  

## 2015-09-12 NOTE — Assessment & Plan Note (Signed)
I have addressed  BMI and recommended wt loss of 10% of body weigth over the next 6 months using a low glycemic index diet and regular exercise a minimum of 5 days per week.

## 2015-09-12 NOTE — Assessment & Plan Note (Signed)
Loss of control discussed,  Diet reviewed and advice given.  increase Januvia to 50 mg daily, followed by increased to 100 mg daily in one month if BS not at goal

## 2015-09-15 ENCOUNTER — Other Ambulatory Visit: Payer: Self-pay | Admitting: Internal Medicine

## 2015-09-15 ENCOUNTER — Telehealth: Payer: Self-pay | Admitting: Internal Medicine

## 2015-09-15 MED ORDER — SITAGLIPTIN PHOSPHATE 50 MG PO TABS: 50.0000 mg | ORAL_TABLET | Freq: Every day | ORAL | Status: DC

## 2015-09-15 NOTE — Telephone Encounter (Signed)
Medication was sent to the wrong pharmacy. i will be sending to [facility] this is where the patient would like it sent it was originally sent to [facility].

## 2015-09-15 NOTE — Telephone Encounter (Signed)
Patient called asking for her januvia sent to [facility] and it was sent to [facility].

## 2015-09-18 ENCOUNTER — Telehealth: Payer: Self-pay | Admitting: Internal Medicine

## 2015-09-18 DIAGNOSIS — F520 Hypoactive sexual desire disorder: Secondary | ICD-10-CM

## 2015-09-18 DIAGNOSIS — K449 Diaphragmatic hernia without obstruction or gangrene: Secondary | ICD-10-CM

## 2015-09-18 DIAGNOSIS — E1121 Type 2 diabetes mellitus with diabetic nephropathy: Secondary | ICD-10-CM

## 2015-09-18 NOTE — Telephone Encounter (Signed)
Labs amended and signed.  Thank you!  please remind her she needs to provide urine

## 2015-09-18 NOTE — Telephone Encounter (Signed)
Need labs same as December?

## 2015-09-18 NOTE — Telephone Encounter (Signed)
Labs entered.

## 2015-09-18 NOTE — Telephone Encounter (Signed)
Last set of labs appear to be 09/08/15.  Please verify which labs need to be ordered before March appointment.  Thank you.

## 2015-09-18 NOTE — Telephone Encounter (Signed)
Left message to call office to schedule AWV at the end of January/msn

## 2015-09-18 NOTE — Telephone Encounter (Signed)
Pt called wanting to get lab work done before her March appt. Need orders please and Thank You!

## 2015-10-07 ENCOUNTER — Encounter: Payer: Self-pay | Admitting: Nurse Practitioner

## 2015-10-07 ENCOUNTER — Telehealth: Payer: Self-pay | Admitting: *Deleted

## 2015-10-07 ENCOUNTER — Ambulatory Visit (INDEPENDENT_AMBULATORY_CARE_PROVIDER_SITE_OTHER): Payer: 59 | Admitting: Nurse Practitioner

## 2015-10-07 VITALS — BP 116/74 | HR 96 | Temp 98.0°F | Resp 14 | Ht 61.0 in | Wt 177.0 lb

## 2015-10-07 DIAGNOSIS — K449 Diaphragmatic hernia without obstruction or gangrene: Secondary | ICD-10-CM

## 2015-10-07 DIAGNOSIS — N907 Vulvar cyst: Secondary | ICD-10-CM | POA: Diagnosis not present

## 2015-10-07 NOTE — Telephone Encounter (Signed)
Let her know to call the pharmacy to have them requested.

## 2015-10-07 NOTE — Telephone Encounter (Signed)
Patient stated that she uses [facility] pharmacy at this time, she also uses [facility] for same day Rx. She stated that when she viewed her medication list with,[facility],she only viewed 4 medictions on the list and there should be 8. Her question was, should she send  a list of medications, to have this corrected, or would the office take care of this.  Contact [phone]

## 2015-10-07 NOTE — Patient Instructions (Signed)
Warm compresses and ibuprofen as needed for the bump. If worsens, grows, becomes painful- please return to be seen.   Watch spicy foods, tomato, chocolate, carbonated beverages, and fried/fatty foods for a week or so while recovering.

## 2015-10-07 NOTE — Assessment & Plan Note (Addendum)
Irritated from recent vomiting episode this weekend. Presumed GI bug, lasted 24 hours. Appetite returning. Continue omeprazole.

## 2015-10-07 NOTE — Progress Notes (Signed)
Patient ID: Sandra Hendrix, female    DOB: 07/04/1946  Age: 70 y.o. MRN: [id number]  CC: Mass   [name] presents for CC mass of vulva left side x 2-3 days.  1) Noticed in shower 2-3 days ago, tender, no discharge, has improved on its own. No treatment to date  2) Vomiting episode this weekend for 24 hrs Medications for DM had to stop this due to vomiting Eating soup and chicken salad Epigastric tenderness and heart burn afterwards  Able to keep medications down and foods down  History Nazanin has a past medical history of Diabetes mellitus without complication ([place]); Hyperlipidemia; Hypertension; Arthritis; Depression; GERD (gastroesophageal reflux disease); Allergy; Urinary incontinence; and Varicose veins.   She has past surgical history that includes Joint replacement (Right, 2007); Joint replacement (Left, 2004); and Rotator cuff repair (Right).   Her family history includes Alcohol abuse in her father; Arthritis in her sister; Cancer in her maternal aunt and maternal grandfather; Diabetes in her father, paternal aunt, and sister; Drug abuse in her paternal aunt; Hyperlipidemia in her daughter; Hypertension in her daughter and mother; Lupus in her mother; Mental retardation in her daughter; Stroke in her mother.She reports that she has never smoked. She does not have any smokeless tobacco history on file. She reports that she does not drink alcohol or use illicit drugs.  Outpatient Prescriptions Prior to Visit  Medication Sig Dispense Refill  . ACCU-CHEK AVIVA PLUS test strip Apply 1 each topically daily.    Marland Kitchen albuterol (PROVENTIL HFA;VENTOLIN HFA) 108 (90 BASE) MCG/ACT inhaler Inhale 2 puffs into the lungs every 6 (six) hours as needed for wheezing.    Marland Kitchen aspirin EC 81 MG tablet Take 81 mg by mouth daily.    Marland Kitchen atorvastatin (LIPITOR) 40 MG tablet Take 1 tablet (40 mg total) by mouth daily at 6 PM. 30 tablet 1  . azelastine (ASTEPRO) 137 MCG/SPRAY nasal spray Place 1  spray into the nose 2 (two) times daily. Use in each nostril as directed (Patient taking differently: Place 1 spray into the nose daily. Use in each nostril as directed) 30 mL 11  . BIOTIN PO Take by mouth. Take 1000mg  by mouth twice daily.    . Black Cohosh (REMIFEMIN) 20 MG TABS Take 20 mg by mouth 2 (two) times daily.     . Calcium Carbonate-Vitamin D (CALCIUM 600 + D PO) Take 1 tablet by mouth daily.    . Cholecalciferol (D3-1000 PO) Take 1,000 Int'l Units by mouth every morning.     . fish oil-omega-3 fatty acids 1000 MG capsule Take 1 capsule by mouth daily.    . fluticasone (FLONASE) 50 MCG/ACT nasal spray Place 1 spray into both nostrils daily. 48 g 1  . furosemide (LASIX) 20 MG tablet Take 20 mg by mouth every other day.    Marland Kitchen glipiZIDE (GLUCOTROL XL) 10 MG 24 hr tablet Take 1 tablet (10 mg total) by mouth daily. 30 tablet 1  . Lancet Devices (ACCU-CHEK SOFTCLIX) lancets Use to test blood sugars 1 -2 times daily 1 each 3  . lisinopril (PRINIVIL,ZESTRIL) 20 MG tablet TAKE 1 TABLET EVERY DAY 90 tablet 1  . Magnesium Oxide -Mg Supplement 250 MG TABS Take 250 mg by mouth daily.    . Meclizine HCl 25 MG CHEW Chew 25 mg by mouth daily as needed (for vertigo).     Marland Kitchen metformin (FORTAMET) 500 MG (OSM) 24 hr tablet TAKE 2 TABLETS TWICE DAILY WITH MEALS 360 tablet  1  . Multiple Vitamin (MULTIVITAMIN) tablet Take 1 tablet by mouth daily.    Marland Kitchen omeprazole (PRILOSEC) 20 MG capsule TAKE 1 CAPSULE EVERY DAY 90 capsule 1  . Potassium 99 MG TABS Take 1 tablet by mouth daily.    Marland Kitchen senna-docusate (SENOKOT-S) 8.6-50 MG per tablet Take 1 tablet by mouth daily. 30 tablet 0  . sertraline (ZOLOFT) 50 MG tablet TAKE 1 TABLET EVERY DAY 90 tablet 2  . sitaGLIPtin (JANUVIA) 50 MG tablet Take 1 tablet (50 mg total) by mouth daily. 90 tablet 1  . verapamil (VERELAN PM) 180 MG 24 hr capsule TAKE 1 CAPSULE EVERY DAY 90 capsule 1   No facility-administered medications prior to visit.    ROS Review of Systems    Constitutional: Negative for fever, chills, diaphoresis and fatigue.  Respiratory: Negative for chest tightness, shortness of breath and wheezing.   Cardiovascular: Negative for chest pain, palpitations and leg swelling.  Gastrointestinal: Positive for nausea, vomiting and abdominal pain. Negative for diarrhea and abdominal distention.  Genitourinary: Negative for vaginal bleeding, vaginal discharge and vaginal pain.  Skin: Negative for rash.    Objective:  BP 116/74 mmHg  Pulse 96  Temp(Src) 98 F (36.7 C) (Oral)  Resp 14  Ht 5\' 1"  (1.549 m)  Wt 177 lb (80.287 kg)  BMI 33.46 kg/m2  SpO2 96%  Physical Exam  Constitutional: She is oriented to person, place, and time. She appears well-developed and well-nourished. No distress.  HENT:  Head: Normocephalic and atraumatic.  Right Ear: External ear normal.  Left Ear: External ear normal.  Abdominal: Soft. Bowel sounds are normal. She exhibits no distension and no mass. There is no tenderness. There is no rebound and no guarding.  Genitourinary:     Approximate area of mass, resolved, unable to feel mass  Neurological: She is alert and oriented to person, place, and time. No cranial nerve deficit. She exhibits normal muscle tone. Coordination normal.  Skin: Skin is warm and dry. No rash noted. She is not diaphoretic.  Psychiatric: She has a normal mood and affect. Her behavior is normal. Judgment and thought content normal.      Assessment & Plan:   Everette was seen today for mass.  Diagnoses and all orders for this visit:  Hiatal hernia  Cyst, vulva   I am having Ms. Hornback maintain her Cholecalciferol (D3-1000 PO), BIOTIN PO, multivitamin, Calcium Carbonate-Vitamin D (CALCIUM 600 + D PO), Potassium, fish oil-omega-3 fatty acids, Black Cohosh, Meclizine HCl, albuterol, azelastine, fluticasone, accu-chek softclix, sertraline, lisinopril, aspirin EC, ACCU-CHEK AVIVA PLUS, Magnesium Oxide -Mg Supplement, senna-docusate,  verapamil, metformin, omeprazole, furosemide, atorvastatin, glipiZIDE, and sitaGLIPtin.  No orders of the defined types were placed in this encounter.     Follow-up: Return if symptoms worsen or fail to improve.

## 2015-10-11 DIAGNOSIS — N907 Vulvar cyst: Secondary | ICD-10-CM | POA: Insufficient documentation

## 2015-10-11 NOTE — Assessment & Plan Note (Signed)
Resolved on its own FU prn worsening/failure to improve.

## 2015-10-13 ENCOUNTER — Encounter: Payer: Self-pay | Admitting: Sports Medicine

## 2015-10-13 ENCOUNTER — Ambulatory Visit (INDEPENDENT_AMBULATORY_CARE_PROVIDER_SITE_OTHER): Payer: Medicare Other | Admitting: Sports Medicine

## 2015-10-13 DIAGNOSIS — E119 Type 2 diabetes mellitus without complications: Secondary | ICD-10-CM

## 2015-10-13 DIAGNOSIS — M79676 Pain in unspecified toe(s): Secondary | ICD-10-CM | POA: Diagnosis not present

## 2015-10-13 DIAGNOSIS — L84 Corns and callosities: Secondary | ICD-10-CM

## 2015-10-13 NOTE — Progress Notes (Signed)
Patient ID: Sandra Hendrix, female   DOB: [birth year], 70 y.o.   MRN: [id number] Subjective: Sandra Hendrix is a 70 y.o. female patient with history of type 2 diabetes who presents to office today complaining of painful corn on right 5th toe  while ambulating in shoes; unable to trim. Patient has tried pad and changing shoes; reports that about 1 month ago the toe was more inflamed until she changed her shoes which made it better. Patient states that the glucose reading this morning was 93 mg/dl. Patient denies any new changes in medication or new problems. Patient denies any new cramping, numbness, burning or tingling in the legs.  Patient Active Problem List   Diagnosis Date Noted  . Cyst, vulva 10/11/2015  . Mass of left breast 03/04/2015  . Ileus, postoperative 02/21/2015  . CKD (chronic kidney disease) stage 3, GFR 30-59 ml/min 11/07/2014  . UTI (urinary tract infection) 02/08/2014  . Acid reflux 12/26/2013  . Irritable bowel syndrome with constipation 12/26/2013  . Welcome to Medicare preventive visit 10/21/2013  . Hyperlipidemia LDL goal <100 07/15/2013  . Obesity 07/15/2013  . Chronic venous insufficiency 03/21/2013  . DM (diabetes mellitus), type 2 with renal complications ([place]) [id number]  . Lack of libido 11/05/2012  . Barrett's esophagus 11/05/2012  . Hiatal hernia 11/05/2012  . Essential hypertension 11/05/2012  . Urinary incontinence    Current Outpatient Prescriptions on File Prior to Visit  Medication Sig Dispense Refill  . ACCU-CHEK AVIVA PLUS test strip Apply 1 each topically daily.    Marland Kitchen albuterol (PROVENTIL HFA;VENTOLIN HFA) 108 (90 BASE) MCG/ACT inhaler Inhale 2 puffs into the lungs every 6 (six) hours as needed for wheezing.    Marland Kitchen aspirin EC 81 MG tablet Take 81 mg by mouth daily.    Marland Kitchen atorvastatin (LIPITOR) 40 MG tablet Take 1 tablet (40 mg total) by mouth daily at 6 PM. 30 tablet 1  . azelastine (ASTEPRO) 137 MCG/SPRAY nasal spray Place 1 spray into the nose 2  (two) times daily. Use in each nostril as directed (Patient taking differently: Place 1 spray into the nose daily. Use in each nostril as directed) 30 mL 11  . BIOTIN PO Take by mouth. Take 1000mg  by mouth twice daily.    . Black Cohosh (REMIFEMIN) 20 MG TABS Take 20 mg by mouth 2 (two) times daily.     . Calcium Carbonate-Vitamin D (CALCIUM 600 + D PO) Take 1 tablet by mouth daily.    . Cholecalciferol (D3-1000 PO) Take 1,000 Int'l Units by mouth every morning.     . fish oil-omega-3 fatty acids 1000 MG capsule Take 1 capsule by mouth daily.    . fluticasone (FLONASE) 50 MCG/ACT nasal spray Place 1 spray into both nostrils daily. 48 g 1  . furosemide (LASIX) 20 MG tablet Take 20 mg by mouth every other day.    Marland Kitchen glipiZIDE (GLUCOTROL XL) 10 MG 24 hr tablet Take 1 tablet (10 mg total) by mouth daily. 30 tablet 1  . Lancet Devices (ACCU-CHEK SOFTCLIX) lancets Use to test blood sugars 1 -2 times daily 1 each 3  . lisinopril (PRINIVIL,ZESTRIL) 20 MG tablet TAKE 1 TABLET EVERY DAY 90 tablet 1  . Magnesium Oxide -Mg Supplement 250 MG TABS Take 250 mg by mouth daily.    . Meclizine HCl 25 MG CHEW Chew 25 mg by mouth daily as needed (for vertigo).     Marland Kitchen metformin (FORTAMET) 500 MG (OSM) 24 hr tablet TAKE 2 TABLETS TWICE  DAILY WITH MEALS 360 tablet 1  . Multiple Vitamin (MULTIVITAMIN) tablet Take 1 tablet by mouth daily.    Marland Kitchen omeprazole (PRILOSEC) 20 MG capsule TAKE 1 CAPSULE EVERY DAY 90 capsule 1  . Potassium 99 MG TABS Take 1 tablet by mouth daily.    Marland Kitchen senna-docusate (SENOKOT-S) 8.6-50 MG per tablet Take 1 tablet by mouth daily. 30 tablet 0  . sertraline (ZOLOFT) 50 MG tablet TAKE 1 TABLET EVERY DAY 90 tablet 2  . sitaGLIPtin (JANUVIA) 50 MG tablet Take 1 tablet (50 mg total) by mouth daily. 90 tablet 1  . verapamil (VERELAN PM) 180 MG 24 hr capsule TAKE 1 CAPSULE EVERY DAY 90 capsule 1   No current facility-administered medications on file prior to visit.   Allergies  Allergen Reactions  .  Clindamycin/Lincomycin Rash  . Codeine Rash  . Morphine And Related Nausea Only  . Talwin [Pentazocine] Nausea And Vomiting  . Oxycodone-Acetaminophen Other (See Comments)    Pt states this medication makes her bowels stop moving  . Cleocin [Clindamycin Hcl] Rash   Labs: HEMOGLOBIN A1C- No recent lab on file  Objective: General: Patient is awake, alert, and oriented x 3 and in no acute distress.  Integument: Skin is warm, dry and supple bilateral. Nails are short. No signs of infection. + Corn to dorsal PIPJ right 5th toe. Old surgical scars well healed. Remaining integument unremarkable.  Vasculature:  Dorsalis Pedis pulse 2/4 bilateral. Posterior Tibial pulse  1/4 bilateral.  Capillary fill time <3 sec 1-5 bilateral. Positive hair growth to the level of the digits. Temperature gradient within normal limits. No varicosities present bilateral. No edema present bilateral.   Neurology: The patient has intact sensation measured with a 5.07/10g Semmes Weinstein Monofilament at all pedal sites bilateral . Vibratory sensation slightly diminished bilateral with tuning fork. No Babinski sign present bilateral.   Musculoskeletal: Asymptomatic hammertoe gross pedal deformities noted bilateral. Muscular strength 5/5 in all lower extremity muscular groups bilateral without pain on range of motion . No tenderness with calf compression bilateral.  Assessment and Plan: Problem List Items Addressed This Visit    None    Visit Diagnoses    Corns and callosities    -  Primary    Pain of toe, unspecified laterality        Diabetes mellitus without complication ([place])          -Examined patient. -Discussed and educated patient on diabetic foot care, especially with  regards to the vascular, neurological and musculoskeletal systems.  -Stressed the importance of good glycemic control and the detriment of not  controlling glucose levels in relation to the foot. -Mechanically debrided corn x 1 using  sterile chisel blade without incident  -Gave patient silicone toe cap to protect 5th toe -Advised patient to use wider shoes -Answered all patient questions -Patient to return in 3 months for at risk foot care; callus and nail care if needed -Patient advised to call the office if any problems or questions arise in the meantime.  Landis Martins, DPM

## 2015-10-13 NOTE — Patient Instructions (Signed)
Diabetes and Foot Care Diabetes may cause you to have problems because of poor blood supply (circulation) to your feet and legs. This may cause the skin on your feet to become thinner, break easier, and heal more slowly. Your skin may become dry, and the skin may peel and crack. You may also have nerve damage in your legs and feet causing decreased feeling in them. You may not notice minor injuries to your feet that could lead to infections or more serious problems. Taking care of your feet is one of the most important things you can do for yourself.  HOME CARE INSTRUCTIONS  Wear shoes at all times, even in the house. Do not go barefoot. Bare feet are easily injured.  Check your feet daily for blisters, cuts, and redness. If you cannot see the bottom of your feet, use a mirror or ask someone for help.  Wash your feet with warm water (do not use hot water) and mild soap. Then pat your feet and the areas between your toes until they are completely dry. Do not soak your feet as this can dry your skin.  Apply a moisturizing lotion or petroleum jelly (that does not contain alcohol and is unscented) to the skin on your feet and to dry, brittle toenails. Do not apply lotion between your toes.  Trim your toenails straight across. Do not dig under them or around the cuticle. File the edges of your nails with an emery board or nail file.  Do not cut corns or calluses or try to remove them with medicine.  Wear clean socks or stockings every day. Make sure they are not too tight. Do not wear knee-high stockings since they may decrease blood flow to your legs.  Wear shoes that fit properly and have enough cushioning. To break in new shoes, wear them for just a few hours a day. This prevents you from injuring your feet. Always look in your shoes before you put them on to be sure there are no objects inside.  Do not cross your legs. This may decrease the blood flow to your feet.  If you find a minor scrape,  cut, or break in the skin on your feet, keep it and the skin around it clean and dry. These areas may be cleansed with mild soap and water. Do not cleanse the area with peroxide, alcohol, or iodine.  When you remove an adhesive bandage, be sure not to damage the skin around it.  If you have a wound, look at it several times a day to make sure it is healing.  Do not use heating pads or hot water bottles. They may burn your skin. If you have lost feeling in your feet or legs, you may not know it is happening until it is too late.  Make sure your health care provider performs a complete foot exam at least annually or more often if you have foot problems. Report any cuts, sores, or bruises to your health care provider immediately. SEEK MEDICAL CARE IF:   You have an injury that is not healing.  You have cuts or breaks in the skin.  You have an ingrown nail.  You notice redness on your legs or feet.  You feel burning or tingling in your legs or feet.  You have pain or cramps in your legs and feet.  Your legs or feet are numb.  Your feet always feel cold. SEEK IMMEDIATE MEDICAL CARE IF:   There is increasing redness,   swelling, or pain in or around a wound.  There is a red line that goes up your leg.  Pus is coming from a wound.  You develop a fever or as directed by your health care provider.  You notice a bad smell coming from an ulcer or wound.   This information is not intended to replace advice given to you by your health care provider. Make sure you discuss any questions you have with your health care provider.   Document Released: 09/09/2000 Document Revised: 05/15/2013 Document Reviewed: 02/19/2013 Elsevier Interactive Patient Education 2016 [employer]  

## 2015-10-26 ENCOUNTER — Ambulatory Visit (INDEPENDENT_AMBULATORY_CARE_PROVIDER_SITE_OTHER): Payer: 59

## 2015-10-26 VITALS — BP 118/72 | HR 86 | Temp 98.2°F | Resp 14 | Ht 61.0 in | Wt 177.1 lb

## 2015-10-26 DIAGNOSIS — Z0000 Encounter for general adult medical examination without abnormal findings: Secondary | ICD-10-CM | POA: Diagnosis not present

## 2015-10-26 DIAGNOSIS — Z1159 Encounter for screening for other viral diseases: Secondary | ICD-10-CM | POA: Diagnosis not present

## 2015-10-26 NOTE — Progress Notes (Signed)
  I have reviewed the above information and agree with above.   Filemon Breton, MD 

## 2015-10-26 NOTE — Patient Instructions (Addendum)
Sandra Hendrix,  Thank you for taking time to come for your Medicare Wellness Visit.  I appreciate your ongoing commitment to your health goals. Please review the following plan we discussed and let me know if I can assist you in the future.  Return in March for labs and scheduled follow up with Dr. Derrel Nip.  Fall Prevention in the Home  Falls can cause injuries. They can happen to people of all ages. There are many things you can do to make your home safe and to help prevent falls.  WHAT CAN I DO ON THE OUTSIDE OF MY HOME?  Regularly fix the edges of walkways and driveways and fix any cracks.  Remove anything that might make you trip as you walk through a door, such as a raised step or threshold.  Trim any bushes or trees on the path to your home.  Use bright outdoor lighting.  Clear any walking paths of anything that might make someone trip, such as rocks or tools.  Regularly check to see if handrails are loose or broken. Make sure that both sides of any steps have handrails.  Any raised decks and porches should have guardrails on the edges.  Have any leaves, snow, or ice cleared regularly.  Use sand or salt on walking paths during winter.  Clean up any spills in your garage right away. This includes oil or grease spills. WHAT CAN I DO IN THE BATHROOM?   Use night lights.  Install grab bars by the toilet and in the tub and shower. Do not use towel bars as grab bars.  Use non-skid mats or decals in the tub or shower.  If you need to sit down in the shower, use a plastic, non-slip stool.  Keep the floor dry. Clean up any water that spills on the floor as soon as it happens.  Remove soap buildup in the tub or shower regularly.  Attach bath mats securely with double-sided non-slip rug tape.  Do not have throw rugs and other things on the floor that can make you trip. WHAT CAN I DO IN THE BEDROOM?  Use night lights.  Make sure that you have a light by your bed that is easy  to reach.  Do not use any sheets or blankets that are too big for your bed. They should not hang down onto the floor.  Have a firm chair that has side arms. You can use this for support while you get dressed.  Do not have throw rugs and other things on the floor that can make you trip. WHAT CAN I DO IN THE KITCHEN?  Clean up any spills right away.  Avoid walking on wet floors.  Keep items that you use a lot in easy-to-reach places.  If you need to reach something above you, use a strong step stool that has a grab bar.  Keep electrical cords out of the way.  Do not use floor polish or wax that makes floors slippery. If you must use wax, use non-skid floor wax.  Do not have throw rugs and other things on the floor that can make you trip. WHAT CAN I DO WITH MY STAIRS?  Do not leave any items on the stairs.  Make sure that there are handrails on both sides of the stairs and use them. Fix handrails that are broken or loose. Make sure that handrails are as long as the stairways.  Check any carpeting to make sure that it is firmly attached to  the stairs. Fix any carpet that is loose or worn.  Avoid having throw rugs at the top or bottom of the stairs. If you do have throw rugs, attach them to the floor with carpet tape.  Make sure that you have a light switch at the top of the stairs and the bottom of the stairs. If you do not have them, ask someone to add them for you. WHAT ELSE CAN I DO TO HELP PREVENT FALLS?  Wear shoes that:  Do not have high heels.  Have rubber bottoms.  Are comfortable and fit you well.  Are closed at the toe. Do not wear sandals.  If you use a stepladder:  Make sure that it is fully opened. Do not climb a closed stepladder.  Make sure that both sides of the stepladder are locked into place.  Ask someone to hold it for you, if possible.  Clearly mark and make sure that you can see:  Any grab bars or handrails.  First and last steps.  Where  the edge of each step is.  Use tools that help you move around (mobility aids) if they are needed. These include:  Canes.  Walkers.  Scooters.  Crutches.  Turn on the lights when you go into a dark area. Replace any light bulbs as soon as they burn out.  Set up your furniture so you have a clear path. Avoid moving your furniture around.  If any of your floors are uneven, fix them.  If there are any pets around you, be aware of where they are.  Review your medicines with your doctor. Some medicines can make you feel dizzy. This can increase your chance of falling. Ask your doctor what other things that you can do to help prevent falls.   This information is not intended to replace advice given to you by your health care provider. Make sure you discuss any questions you have with your health care provider.   Document Released: 07/09/2009 Document Revised: 01/27/2015 Document Reviewed: 10/17/2014 Elsevier Interactive Patient Education [employer].

## 2015-10-26 NOTE — Progress Notes (Signed)
Subjective:   Sandra Hendrix is a 70 y.o. female who presents for Medicare Annual (Subsequent) preventive examination.  Review of Systems:  No ROS.  Medicare Wellness Visit.  Cardiac Risk Factors include: advanced age (>19men, >61 women);diabetes mellitus;hypertension     Objective:     Vitals: BP 118/72 mmHg  Pulse 86  Temp(Src) 98.2 F (36.8 C) (Oral)  Resp 14  Ht 5\' 1"  (1.549 m)  Wt 177 lb 1.9 oz (80.341 kg)  BMI 33.48 kg/m2  SpO2 97%  Tobacco History  Smoking status  . Never Smoker   Smokeless tobacco  . Not on file     Counseling given: Not Answered   Past Medical History  Diagnosis Date  . Diabetes mellitus without complication ([place])   . Hyperlipidemia   . Hypertension   . Arthritis   . Depression   . GERD (gastroesophageal reflux disease)   . Allergy   . Urinary incontinence   . Varicose veins    Past Surgical History  Procedure Laterality Date  . Joint replacement Right 2007  . Joint replacement Left 2004  . Rotator cuff repair Right    Family History  Problem Relation Age of Onset  . Stroke Mother   . Hypertension Mother   . Lupus Mother   . Alcohol abuse Father   . Diabetes Father   . Cancer Father     Liver Cancer  . Arthritis Sister   . Diabetes Sister   . Hyperlipidemia Daughter   . Hypertension Daughter   . Mental retardation Daughter   . Cancer Maternal Aunt     ovarian ca  . Diabetes Paternal Aunt   . Drug abuse Paternal Aunt   . Cancer Maternal Grandfather     colon ca   History  Sexual Activity  . Sexual Activity: Yes    Outpatient Encounter Prescriptions as of 10/26/2015  Medication Sig  . ACCU-CHEK AVIVA PLUS test strip Apply 1 each topically daily.  Marland Kitchen albuterol (PROVENTIL HFA;VENTOLIN HFA) 108 (90 BASE) MCG/ACT inhaler Inhale 2 puffs into the lungs every 6 (six) hours as needed for wheezing.  Marland Kitchen aspirin EC 81 MG tablet Take 81 mg by mouth daily.  Marland Kitchen atorvastatin (LIPITOR) 40 MG tablet Take 1 tablet (40 mg total)  by mouth daily at 6 PM.  . azelastine (ASTEPRO) 137 MCG/SPRAY nasal spray Place 1 spray into the nose 2 (two) times daily. Use in each nostril as directed (Patient taking differently: Place 1 spray into the nose daily. Use in each nostril as directed)  . BIOTIN PO Take by mouth. Take 1000mg  by mouth twice daily.  . Black Cohosh (REMIFEMIN) 20 MG TABS Take 20 mg by mouth 2 (two) times daily.   . Calcium Carbonate-Vitamin D (CALCIUM 600 + D PO) Take 1 tablet by mouth daily.  . Cholecalciferol (D3-1000 PO) Take 1,000 Int'l Units by mouth every morning.   . fish oil-omega-3 fatty acids 1000 MG capsule Take 1 capsule by mouth daily.  . fluticasone (FLONASE) 50 MCG/ACT nasal spray Place 1 spray into both nostrils daily.  . furosemide (LASIX) 20 MG tablet Take 20 mg by mouth every other day.  Marland Kitchen glipiZIDE (GLUCOTROL XL) 10 MG 24 hr tablet Take 1 tablet (10 mg total) by mouth daily.  Elmore Guise Devices (ACCU-CHEK SOFTCLIX) lancets Use to test blood sugars 1 -2 times daily  . lisinopril (PRINIVIL,ZESTRIL) 20 MG tablet TAKE 1 TABLET EVERY DAY  . Magnesium Oxide -Mg Supplement 250 MG TABS Take  250 mg by mouth daily.  . Meclizine HCl 25 MG CHEW Chew 25 mg by mouth daily as needed (for vertigo).   Marland Kitchen metformin (FORTAMET) 500 MG (OSM) 24 hr tablet TAKE 2 TABLETS TWICE DAILY WITH MEALS  . Multiple Vitamin (MULTIVITAMIN) tablet Take 1 tablet by mouth daily.  Marland Kitchen omeprazole (PRILOSEC) 20 MG capsule TAKE 1 CAPSULE EVERY DAY  . Potassium 99 MG TABS Take 1 tablet by mouth daily.  Marland Kitchen senna-docusate (SENOKOT-S) 8.6-50 MG per tablet Take 1 tablet by mouth daily.  . sertraline (ZOLOFT) 50 MG tablet TAKE 1 TABLET EVERY DAY  . sitaGLIPtin (JANUVIA) 50 MG tablet Take 1 tablet (50 mg total) by mouth daily.  . verapamil (VERELAN PM) 180 MG 24 hr capsule TAKE 1 CAPSULE EVERY DAY   No facility-administered encounter medications on file as of 10/26/2015.    Activities of Daily Living In your present state of health, do you have  any difficulty performing the following activities: 10/26/2015 10/07/2015  Hearing? N N  Vision? N N  Difficulty concentrating or making decisions? N N  Walking or climbing stairs? Y N  Dressing or bathing? N N  Doing errands, shopping? N N  Preparing Food and eating ? N -  Using the Toilet? N -  In the past six months, have you accidently leaked urine? N -  Do you have problems with loss of bowel control? N -  Managing your Medications? N -  Managing your Finances? N -  Housekeeping or managing your Housekeeping? N -    Patient Care Team: Crecencio Mc, MD as PCP - General (Internal Medicine)    Assessment:   This is a routine wellness examination for Sandra Hendrix The goal of the wellness visit is to assist the patient how to close the gaps in care and create a preventative care plan for the patient.   Taking VIT D/Calcium as appropriate/Osteoporosis risk reviewed.  Medications reviewed; taking without issues or barriers.  Safety issues reviewed; smoke detectors in the home. Firearms locked in a secure area in the home. Wears seatbelts when driving or riding with others. No violence in the home.  No identified risk were noted; The patient was oriented x 3; appropriate in dress and manner and no objective failures at ADL's or IADL's.    DMII renl nt st uncnt-stable and followed by Dr. Derrel Nip Type 2 diabetes mell-stable and followed by Dr. Derrel Nip Paralytic ileus-stable and followed by Dr. Derrel Nip  Patient Concerns:  None at this time.  Follow up with PCP as needed.  Exercise Activities and Dietary recommendations Current Exercise Habits:: The patient does not participate in regular exercise at present  Goals    . Healthy Lifestyle     Stay hydrated! Drink plenty of water. Choose lean meats, fruits and vegetables. Start chair exercises as demonstrated, as tolerated.  Education provided.        Fall Risk Fall Risk  10/26/2015 09/10/2015 11/09/2014 11/07/2014 05/08/2014  Falls  in the past year? Yes Yes Yes Yes No  Number falls in past yr: 1 1 1 2  or more -  Injury with Fall? Yes No Yes Yes -  Risk for fall due to : - - History of fall(s) - -  Follow up Education provided;Falls prevention discussed Falls evaluation completed Falls prevention discussed Education provided -   Depression Screen PHQ 2/9 Scores 10/26/2015 09/10/2015 11/09/2014 11/07/2014  PHQ - 2 Score 0 0 0 0     Cognitive Testing MMSE - Mini  Mental State Exam 10/26/2015  Orientation to time 5  Orientation to Place 5  Registration 3  Attention/ Calculation 5  Recall 3  Language- name 2 objects 2  Language- repeat 1  Language- follow 3 step command 3  Language- read & follow direction 1  Write a sentence 1  Copy design 1  Total score 30    Immunization History  Administered Date(s) Administered  . Influenza Split 05/21/2013, 06/06/2014  . Influenza,inj,Quad PF,36+ Mos 06/05/2015  . Influenza-Unspecified 04/26/2012  . Pneumococcal Conjugate-13 10/21/2013  . Pneumococcal Polysaccharide-23 07/15/2004, 05/08/2010  . Td 11/22/2003  . Tdap 01/17/2014  . Zoster 01/17/2014   Screening Tests Health Maintenance  Topic Date Due  . Hepatitis C Screening  1946-03-13  . HEMOGLOBIN A1C  03/08/2016  . INFLUENZA VACCINE  04/26/2016  . FOOT EXAM  06/04/2016  . OPHTHALMOLOGY EXAM  07/10/2016  . MAMMOGRAM  03/23/2017  . COLONOSCOPY  07/18/2021  . TETANUS/TDAP  01/18/2024  . DEXA SCAN  Completed  . ZOSTAVAX  Completed  . PNA vac Low Risk Adult  Completed      Plan:    End of life planning; Advance aging; Advanced directives discussed. Copy requested of current HCPOA/Living Will.   During the course of the visit the patient was educated and counseled about the following appropriate screening and preventive services:   Vaccines to include Pneumoccal, Influenza, Hepatitis B, Td, Zostavax, HCV  Electrocardiogram  Cardiovascular Disease  Colorectal cancer screening  Bone density  screening  Diabetes screening  Glaucoma screening  Mammography/PAP  Nutrition counseling   Patient Instructions (the written plan) was given to the patient.   Varney Biles, LPN  [id number]

## 2015-11-16 ENCOUNTER — Encounter: Payer: Self-pay | Admitting: Internal Medicine

## 2015-11-17 ENCOUNTER — Other Ambulatory Visit: Payer: Self-pay | Admitting: Internal Medicine

## 2015-11-17 LAB — HM MAMMOGRAPHY: HM Mammogram: NEGATIVE

## 2015-11-17 NOTE — Telephone Encounter (Signed)
Error

## 2015-11-18 MED ORDER — LISINOPRIL 20 MG PO TABS: 20.0000 mg | ORAL_TABLET | Freq: Every day | ORAL | Status: DC

## 2015-11-18 MED ORDER — SITAGLIPTIN PHOSPHATE 50 MG PO TABS: 50.0000 mg | ORAL_TABLET | Freq: Every day | ORAL | Status: DC

## 2015-11-18 MED ORDER — GLIPIZIDE ER 10 MG PO TB24: 10.0000 mg | ORAL_TABLET | Freq: Every day | ORAL | Status: DC

## 2015-11-18 MED ORDER — ATORVASTATIN CALCIUM 40 MG PO TABS: 40.0000 mg | ORAL_TABLET | Freq: Every day | ORAL | Status: DC

## 2015-11-18 MED ORDER — OMEPRAZOLE 20 MG PO CPDR: 20.0000 mg | DELAYED_RELEASE_CAPSULE | Freq: Every day | ORAL | Status: DC

## 2015-11-18 MED ORDER — SERTRALINE HCL 50 MG PO TABS: 50.0000 mg | ORAL_TABLET | Freq: Every day | ORAL | Status: DC

## 2015-11-18 MED ORDER — METFORMIN HCL ER (OSM) 500 MG PO TB24: ORAL_TABLET | ORAL | Status: DC

## 2015-11-18 MED ORDER — VERAPAMIL HCL ER 180 MG PO CP24: 180.0000 mg | ORAL_CAPSULE | Freq: Every day | ORAL | Status: DC

## 2015-11-18 MED ORDER — FUROSEMIDE 20 MG PO TABS: 20.0000 mg | ORAL_TABLET | ORAL | Status: DC

## 2015-11-19 ENCOUNTER — Other Ambulatory Visit: Payer: Self-pay | Admitting: Internal Medicine

## 2015-11-23 NOTE — Telephone Encounter (Signed)
Please let patient know. They are probably wantingn her to use regular , not ER

## 2015-11-23 NOTE — Telephone Encounter (Signed)
Received denial for Metformin HCL ER on 11/21/15.  Please advise?

## 2015-11-24 ENCOUNTER — Telehealth: Payer: Self-pay

## 2015-11-24 NOTE — Telephone Encounter (Signed)
[name] have you started a PA for Sandra Hendrix metformin? Please advise, thhanks

## 2015-11-24 NOTE — Telephone Encounter (Signed)
Nope not yet, haven't got anything for one .

## 2015-11-25 ENCOUNTER — Telehealth: Payer: Self-pay | Admitting: Internal Medicine

## 2015-11-25 NOTE — Telephone Encounter (Signed)
[facility] has reported that Her mammogram was abnormal on the left .   She will be contacted to get additional films and an ultrasound the facility. If she has not heard from them yet, let us know  

## 2015-11-26 NOTE — Telephone Encounter (Signed)
Patient notified and voiced understanding and will call office if no response from [facility].

## 2015-11-30 NOTE — Telephone Encounter (Signed)
Juliann Pulse did you complete a PA on her for Metformin? thanks

## 2015-11-30 NOTE — Telephone Encounter (Addendum)
Notified patient that I went to cover my meds completed a new PA and faxed recent notes with lab results. For patient PA for Metformin.

## 2015-11-30 NOTE — Telephone Encounter (Signed)
No i didn't know about this PA.

## 2015-11-30 NOTE — Telephone Encounter (Signed)
Pt called back about the PA. Pt also stated she spoke to the insurance and was told they needed more information before it timed out. On 11/21/2015 PA was denied due to needing more information. PA number [id number]. Pt only has 2 days left. Call pt @ [phone]. Thank you!

## 2015-11-30 NOTE — Telephone Encounter (Signed)
I called [facility] and there system is down, so not able to check status of the prior auth.  Metformin PA# [id number].   I can resubmit, but I am not sure who started the initial one.

## 2015-12-01 ENCOUNTER — Encounter: Payer: Self-pay | Admitting: Internal Medicine

## 2015-12-03 MED ORDER — METFORMIN HCL ER 750 MG PO TB24: 1500.0000 mg | ORAL_TABLET | Freq: Every day | ORAL | Status: DC

## 2015-12-03 NOTE — Telephone Encounter (Signed)
Patient Insurance denied PA for Metformin ER generic Fortamet , requesting patient have a 12 week trial of Glucophage XR and documented failure, or to show that A1c level increased on medication.

## 2015-12-03 NOTE — Addendum Note (Signed)
Addended by: Crecencio Mc on: 12/03/2015 12:57 PM   Modules accepted: Orders

## 2015-12-03 NOTE — Telephone Encounter (Signed)
Patient returned my call, she verbalized understanding for change in medication.

## 2015-12-03 NOTE — Telephone Encounter (Signed)
Left a VM to return my call. thanks 

## 2015-12-03 NOTE — Telephone Encounter (Signed)
Please let patient know that fortamet AND glucophage XR are  the SAME MEDICATION!!! (they are both METFORMIN XTENDED RELEASE)  I WILL SEND THE NEW ONE as requested

## 2015-12-08 LAB — HM MAMMOGRAPHY: HM Mammogram: NEGATIVE

## 2015-12-11 ENCOUNTER — Ambulatory Visit: Payer: Medicare PPO | Admitting: Internal Medicine

## 2015-12-11 NOTE — Telephone Encounter (Signed)
Received withdrawal of appeal on 12/04/2015 for PA.

## 2015-12-22 ENCOUNTER — Encounter: Payer: Self-pay | Admitting: Internal Medicine

## 2015-12-28 ENCOUNTER — Other Ambulatory Visit (INDEPENDENT_AMBULATORY_CARE_PROVIDER_SITE_OTHER): Payer: 59

## 2015-12-28 DIAGNOSIS — E1121 Type 2 diabetes mellitus with diabetic nephropathy: Secondary | ICD-10-CM | POA: Diagnosis not present

## 2015-12-28 DIAGNOSIS — Z1159 Encounter for screening for other viral diseases: Secondary | ICD-10-CM

## 2015-12-28 LAB — COMPREHENSIVE METABOLIC PANEL
ALT: 17 U/L (ref 0–35)
AST: 19 U/L (ref 0–37)
Albumin: 4.2 g/dL (ref 3.5–5.2)
Alkaline Phosphatase: 93 U/L (ref 39–117)
BUN: 21 mg/dL (ref 6–23)
CO2: 30 mEq/L (ref 19–32)
Calcium: 9.7 mg/dL (ref 8.4–10.5)
Chloride: 104 mEq/L (ref 96–112)
Creatinine, Ser: 1.01 mg/dL (ref 0.40–1.20)
GFR: 57.31 mL/min — ABNORMAL LOW (ref >60.00)
Glucose, Bld: 139 mg/dL — ABNORMAL HIGH (ref 70–99)
Potassium: 4.7 mEq/L (ref 3.5–5.1)
Sodium: 141 mEq/L (ref 135–145)
Total Bilirubin: 0.4 mg/dL (ref 0.2–1.2)
Total Protein: 7 g/dL (ref 6.0–8.3)

## 2015-12-28 LAB — HEMOGLOBIN A1C: Hgb A1c MFr Bld: 7.2 % — ABNORMAL HIGH (ref 4.6–6.5)

## 2015-12-28 LAB — LIPID PANEL
Cholesterol: 184 mg/dL (ref 0–200)
HDL: 80.2 mg/dL (ref >39.00)
LDL Cholesterol: 86 mg/dL (ref 0–99)
NonHDL: 104.27
Total CHOL/HDL Ratio: 2
Triglycerides: 90 mg/dL (ref 0.0–149.0)
VLDL: 18 mg/dL (ref 0.0–40.0)

## 2015-12-28 LAB — MICROALBUMIN / CREATININE URINE RATIO
Creatinine,U: 175.9 mg/dL
Microalb Creat Ratio: 0.9 mg/g (ref 0.0–30.0)
Microalb, Ur: 1.5 mg/dL (ref 0.0–1.9)

## 2015-12-29 LAB — HEPATITIS C ANTIBODY: HCV Ab: NEGATIVE

## 2015-12-30 ENCOUNTER — Encounter: Payer: Self-pay | Admitting: Internal Medicine

## 2016-01-04 ENCOUNTER — Ambulatory Visit (INDEPENDENT_AMBULATORY_CARE_PROVIDER_SITE_OTHER): Payer: 59 | Admitting: Internal Medicine

## 2016-01-04 ENCOUNTER — Encounter: Payer: Self-pay | Admitting: Internal Medicine

## 2016-01-04 VITALS — BP 118/74 | HR 73 | Temp 98.0°F | Resp 12 | Ht 61.0 in | Wt 180.5 lb

## 2016-01-04 DIAGNOSIS — E785 Hyperlipidemia, unspecified: Secondary | ICD-10-CM

## 2016-01-04 DIAGNOSIS — R05 Cough: Secondary | ICD-10-CM | POA: Diagnosis not present

## 2016-01-04 DIAGNOSIS — R059 Cough, unspecified: Secondary | ICD-10-CM

## 2016-01-04 DIAGNOSIS — E1122 Type 2 diabetes mellitus with diabetic chronic kidney disease: Secondary | ICD-10-CM | POA: Diagnosis not present

## 2016-01-04 NOTE — Patient Instructions (Addendum)
Continue chekckng sugars fasting (morning  OR  2 HOURS POST DINNER MEAL)  IF SUGARS ARE CONTINUALLY > 150 AFTER DINNER OR GREATER THAN 120 FASTING .  WE WILL ADJUST MEDS.    cONTINUE TO TAKE 1/2 ATORVASTATIN DAILY (20 MG DAILY )    Your cough may be coming from reflux, allergies, or post nasal drip (PND). If the cough does not improve after taking the Z pack , we will treat allergies and post nasal drip (PND).  PND and allergies can be treated with benadryl,  Allegra, Zyrtec and claritin. Benadryl is the most effective for drying you up but it is also the most sedating,  So try taking it at night and use one of the others in the daytime.  If it doesn't improve after 2 weeks,  Call for further instructions    To make a low carb chip :  Take the Joseph's Lavash or Pita bread,  Or the [place] Low carb whole wheat tortilla   Place on metal cookie sheet  Brush with olive oil  Sprinkle garlic powder (NOT garlic salt), grated parmesan cheese, mediterranean seasoning , or all of them?  Bake at 225 or 250 for 90 minutes   We have substitutions for your potatoes!!  Try the mashed cauliflower and riced cauliflower dishes instead of rice and mashed potatoes  Mashed turnips are also very low carb!   For dessert:  Try the Dannon Lt n Fit greek yogurt dessert flavors and top with reddi Whip .  8 carbs,  80 calories  Try Oikos Triple Zero [place] Yogurt in the salted caramel, and the coffee flavors  With Whipped Cream for dessert

## 2016-01-04 NOTE — Progress Notes (Signed)
Pre-visit discussion using our clinic review tool. No additional management support is needed unless otherwise documented below in the visit note.  

## 2016-01-04 NOTE — Progress Notes (Signed)
Subjective:  Patient ID: Sandra Hendrix, female    DOB: Feb 11, 1946  Age: 71 y.o. MRN: [id number]  CC: The primary encounter diagnosis was Type 2 diabetes mellitus with diabetic chronic kidney disease, unspecified CKD stage, unspecified long term insulin use status ([place]). Diagnoses of Cough and Hyperlipidemia LDL goal <100 were also pertinent to this visit.  HPI [place] presents for 6 month follow up, has actulaly ained 3 lbs. n Type 2 DM, hyperlipidemia and hypertension.   Being treated for productive cough lasting several weeks with z pack prescribed by allergist.  Symptoms were mostly present in the morning.   No fevers,  But also having  Left maxillary tenderness.  Told they may consider lisinopril as cause if no resolution   Recent diabetes labs reviewed . Morning fastings are 150 , seldom under 120.   Afternoon sugars are random.  Taking her medicatiox as directed and walking 3 times per week. Has not lost any weight , has actually gained 3 lbs.    Outpatient Prescriptions Prior to Visit  Medication Sig Dispense Refill  . ACCU-CHEK AVIVA PLUS test strip Apply 1 each topically daily.    Marland Kitchen albuterol (PROVENTIL HFA;VENTOLIN HFA) 108 (90 BASE) MCG/ACT inhaler Inhale 2 puffs into the lungs every 6 (six) hours as needed for wheezing.    Marland Kitchen aspirin EC 81 MG tablet Take 81 mg by mouth daily.    Marland Kitchen atorvastatin (LIPITOR) 40 MG tablet TAKE 1 TABLET BY MOUTH  DAILY AT 6 PM. 60 tablet 2  . azelastine (ASTEPRO) 137 MCG/SPRAY nasal spray Place 1 spray into the nose 2 (two) times daily. Use in each nostril as directed (Patient taking differently: Place 1 spray into the nose daily. Use in each nostril as directed) 30 mL 11  . BIOTIN PO Take by mouth. Take 1000mg  by mouth twice daily.    . Black Cohosh (REMIFEMIN) 20 MG TABS Take 20 mg by mouth 2 (two) times daily.     . Calcium Carbonate-Vitamin D (CALCIUM 600 + D PO) Take 1 tablet by mouth daily.    . Cholecalciferol (D3-1000 PO) Take 1,000  Int'l Units by mouth every morning.     . fish oil-omega-3 fatty acids 1000 MG capsule Take 1 capsule by mouth daily.    . fluticasone (FLONASE) 50 MCG/ACT nasal spray Place 1 spray into both nostrils daily. 48 g 1  . furosemide (LASIX) 20 MG tablet Take 1 tablet by mouth  every other day 45 tablet 1  . glipiZIDE (GLUCOTROL XL) 10 MG 24 hr tablet Take 1 tablet by mouth  daily 60 tablet 2  . Lancet Devices (ACCU-CHEK SOFTCLIX) lancets Use to test blood sugars 1 -2 times daily 1 each 3  . lisinopril (PRINIVIL,ZESTRIL) 20 MG tablet Take 1 tablet (20 mg total) by mouth daily. 90 tablet 1  . Magnesium Oxide -Mg Supplement 250 MG TABS Take 250 mg by mouth daily.    . Meclizine HCl 25 MG CHEW Chew 25 mg by mouth daily as needed (for vertigo).     . metFORMIN (GLUCOPHAGE XR) 750 MG 24 hr tablet Take 2 tablets (1,500 mg total) by mouth daily with breakfast. 180 tablet 1  . Multiple Vitamin (MULTIVITAMIN) tablet Take 1 tablet by mouth daily.    Marland Kitchen omeprazole (PRILOSEC) 20 MG capsule Take 1 capsule (20 mg total) by mouth daily. 90 capsule 1  . Potassium 99 MG TABS Take 1 tablet by mouth daily.    Marland Kitchen senna-docusate (SENOKOT-S)  8.6-50 MG per tablet Take 1 tablet by mouth daily. 30 tablet 0  . sertraline (ZOLOFT) 50 MG tablet Take 1 tablet (50 mg total) by mouth daily. 90 tablet 1  . sitaGLIPtin (JANUVIA) 50 MG tablet Take 1 tablet (50 mg total) by mouth daily. 90 tablet 1  . verapamil (VERELAN PM) 180 MG 24 hr capsule Take 1 capsule (180 mg total) by mouth daily. 90 capsule 1   No facility-administered medications prior to visit.    Review of Systems;  Patient denies headache, fevers, malaise, unintentional weight loss, skin rash, eye pain, sinus congestion and sinus pain, sore throat, dysphagia,  hemoptysis , cough, dyspnea, wheezing, chest pain, palpitations, orthopnea, edema, abdominal pain, nausea, melena, diarrhea, constipation, flank pain, dysuria, hematuria, urinary  Frequency, nocturia, numbness,  tingling, seizures,  Focal weakness, Loss of consciousness,  Tremor, insomnia, depression, anxiety, and suicidal ideation.      Objective:  BP 118/74 mmHg  Pulse 73  Temp(Src) 98 F (36.7 C) (Oral)  Resp 12  Ht 5\' 1"  (1.549 m)  Wt 180 lb 8 oz (81.874 kg)  BMI 34.12 kg/m2  SpO2 98%  BP Readings from Last 3 Encounters:  01/04/16 118/74  10/26/15 118/72  10/07/15 116/74    Wt Readings from Last 3 Encounters:  01/04/16 180 lb 8 oz (81.874 kg)  10/26/15 177 lb 1.9 oz (80.341 kg)  10/07/15 177 lb (80.287 kg)    General appearance: alert, cooperative and appears stated age Ears: normal TM's and external ear canals both ears Throat: lips, mucosa, and tongue normal; teeth and gums normal Neck: no adenopathy, no carotid bruit, supple, symmetrical, trachea midline and thyroid not enlarged, symmetric, no tenderness/mass/nodules Back: symmetric, no curvature. ROM normal. No CVA tenderness. Lungs: clear to auscultation bilaterally Heart: regular rate and rhythm, S1, S2 normal, no murmur, click, rub or gallop Abdomen: soft, non-tender; bowel sounds normal; no masses,  no organomegaly Pulses: 2+ and symmetric Skin: Skin color, texture, turgor normal. No rashes or lesions Lymph nodes: Cervical, supraclavicular, and axillary nodes normal.  Lab Results  Component Value Date   HGBA1C 7.2* 12/28/2015   HGBA1C 7.9* 09/08/2015   HGBA1C 7.0* 05/25/2015    Lab Results  Component Value Date   CREATININE 1.01 12/28/2015   CREATININE 0.98 09/08/2015   CREATININE 1.09 05/25/2015    Lab Results  Component Value Date   WBC 5.0 02/24/2015   HGB 11.3* 02/24/2015   HCT 34.6* 02/24/2015   PLT 281 02/24/2015   GLUCOSE 139* 12/28/2015   CHOL 184 12/28/2015   TRIG 90.0 12/28/2015   HDL 80.20 12/28/2015   LDLDIRECT 82.0 09/08/2015   LDLCALC 86 12/28/2015   ALT 17 12/28/2015   AST 19 12/28/2015   NA 141 12/28/2015   K 4.7 12/28/2015   CL 104 12/28/2015   CREATININE 1.01 12/28/2015    BUN 21 12/28/2015   CO2 30 12/28/2015   TSH 2.69 11/07/2014   INR 1.1 01/23/2009   HGBA1C 7.2* 12/28/2015   MICROALBUR 1.5 12/28/2015    Ct Abdomen Pelvis W Contrast  02/21/2015  CLINICAL DATA:  Rotator cuff surgery on Tuesday, now feeling constipated with vomiting. EXAM: CT ABDOMEN AND PELVIS WITH CONTRAST TECHNIQUE: Multidetector CT imaging of the abdomen and pelvis was performed using the standard protocol following bolus administration of intravenous contrast. CONTRAST:  142mL OMNIPAQUE IOHEXOL 300 MG/ML  SOLN COMPARISON:  None. FINDINGS: BODY WALL: No contributory findings. LOWER CHEST: Bibasilar atelectasis. Small hiatal hernia with large gastroesophageal reflux. ABDOMEN/PELVIS: Liver: No  focal abnormality. Biliary: Cholecystectomy.  No bile duct enlargement Pancreas: Unremarkable. Spleen: Unremarkable. Adrenals: Unremarkable. Kidneys and ureters: No hydronephrosis or stone. Bladder: Unremarkable. Reproductive: 3.4 cm simple appearing cyst in the left ovary. Bowel: There is dilated fluid-filled proximal small bowel which gradually decreases in caliber without a discrete transition point. The colon is not decompressed, but rather diffusely contains stool. No indication of bowel necrosis or perforation. There is only mild interloop fluid in the central mesenteries. Normal appendix. Retroperitoneum: No mass or adenopathy. Peritoneum: No ascites or pneumoperitoneum. Vascular: No acute abnormality. OSSEOUS: No acute abnormalities. IMPRESSION: 1. Dilated small bowel. Lack of transition point favors reactive ileus over partial obstruction. 2. 34 mm left ovarian cyst. Recommend 1 year followup pelvic ultrasound. Electronically Signed   By: Monte Fantasia M.D.   On: 02/21/2015 04:18    Assessment & Plan:   Problem List Items Addressed This Visit    DM (diabetes mellitus), type 2 with renal complications ([place]) - Primary    Improvement in loss of control discussed,  Diet reviewed and advice given.   increase Januvia to 50 mg daily.   Lab Results  Component Value Date   HGBA1C 7.2* 12/28/2015   Lab Results  Component Value Date   MICROALBUR 1.5 12/28/2015          Hyperlipidemia LDL goal <100    LDL and triglycerides are at goal on atorvastatin. SHe has no side effects and liver enzymes are normal. No changes today    Lab Results  Component Value Date   CHOL 184 12/28/2015   HDL 80.20 12/28/2015   LDLCALC 86 12/28/2015   LDLDIRECT 82.0 09/08/2015   TRIG 90.0 12/28/2015   CHOLHDL 2 12/28/2015   Lab Results  Component Value Date   ALT 17 12/28/2015   AST 19 12/28/2015   ALKPHOS 93 12/28/2015   BILITOT 0.4 12/28/2015           Cough    .Discussed the possible etiologies of persistent cough including but not limited to post nasal drip,  Allergic rhinitis,  Asthma,  GERD and chronic cyclic cough due to persistent irritation.  She is not taking and ACE Inhibitor.  Suggested treating for allergic rhinitis,  PND and GERD to break cycle and reevaluation in a few weeks.         A total of 25 minutes of face to face time was spent with patient more than half of which was spent in counselling about the above mentioned conditions  and coordination of care  I am having Ms. Wanner maintain her Cholecalciferol (D3-1000 PO), BIOTIN PO, multivitamin, Calcium Carbonate-Vitamin D (CALCIUM 600 + D PO), Potassium, fish oil-omega-3 fatty acids, Black Cohosh, Meclizine HCl, albuterol, azelastine, fluticasone, accu-chek softclix, aspirin EC, ACCU-CHEK AVIVA PLUS, Magnesium Oxide -Mg Supplement, senna-docusate, verapamil, sitaGLIPtin, sertraline, omeprazole, lisinopril, furosemide, glipiZIDE, atorvastatin, metFORMIN, and azithromycin.  Meds ordered this encounter  Medications  . azithromycin (ZITHROMAX) 250 MG tablet    Sig: Take by mouth daily.    There are no discontinued medications.  Follow-up: Return in about 3 months (around 04/04/2016) for follow up diabetes.   Crecencio Mc, MD

## 2016-01-05 DIAGNOSIS — R059 Cough, unspecified: Secondary | ICD-10-CM | POA: Insufficient documentation

## 2016-01-05 DIAGNOSIS — R05 Cough: Secondary | ICD-10-CM | POA: Insufficient documentation

## 2016-01-05 NOTE — Assessment & Plan Note (Signed)
.  Discussed the possible etiologies of persistent cough including but not limited to post nasal drip,  Allergic rhinitis,  Asthma,  GERD and chronic cyclic cough due to persistent irritation.  She is not taking and ACE Inhibitor.  Suggested treating for allergic rhinitis,  PND and GERD to break cycle and reevaluation in a few weeks.  

## 2016-01-05 NOTE — Assessment & Plan Note (Addendum)
Improvement in loss of control discussed,  Diet reviewed and advice given.  increase Januvia to 50 mg daily.   Lab Results  Component Value Date   HGBA1C 7.2* 12/28/2015   Lab Results  Component Value Date   MICROALBUR 1.5 12/28/2015

## 2016-01-05 NOTE — Assessment & Plan Note (Signed)
LDL and triglycerides are at goal on atorvastatin. SHe has no side effects and liver enzymes are normal. No changes today    Lab Results  Component Value Date   CHOL 184 12/28/2015   HDL 80.20 12/28/2015   LDLCALC 86 12/28/2015   LDLDIRECT 82.0 09/08/2015   TRIG 90.0 12/28/2015   CHOLHDL 2 12/28/2015   Lab Results  Component Value Date   ALT 17 12/28/2015   AST 19 12/28/2015   ALKPHOS 93 12/28/2015   BILITOT 0.4 12/28/2015

## 2016-01-12 ENCOUNTER — Ambulatory Visit (INDEPENDENT_AMBULATORY_CARE_PROVIDER_SITE_OTHER): Payer: Medicare Other | Admitting: Sports Medicine

## 2016-01-12 ENCOUNTER — Encounter: Payer: Self-pay | Admitting: Sports Medicine

## 2016-01-12 DIAGNOSIS — L84 Corns and callosities: Secondary | ICD-10-CM

## 2016-01-12 DIAGNOSIS — M79676 Pain in unspecified toe(s): Secondary | ICD-10-CM

## 2016-01-12 DIAGNOSIS — Q828 Other specified congenital malformations of skin: Secondary | ICD-10-CM | POA: Diagnosis not present

## 2016-01-12 DIAGNOSIS — E119 Type 2 diabetes mellitus without complications: Secondary | ICD-10-CM | POA: Diagnosis not present

## 2016-01-12 NOTE — Progress Notes (Signed)
Patient ID: Sandra Hendrix, female   DOB: [birth year], 71 y.o.   MRN: [id number]   Subjective: Sandra Hendrix is a 70 y.o. female patient with history of type 2 diabetes who returns to office today complaining of painful corn on right 5th toe  while ambulating in shoes; unable to trim. Patient states that the glucose reading this morning was 73 mg/dl. Patient denies any new changes in medication or new problems. Patient denies any new cramping, numbness, burning or tingling in the legs.  Patient Active Problem List   Diagnosis Date Noted  . Cough 01/05/2016  . Cyst, vulva 10/11/2015  . Mass of left breast 03/04/2015  . Ileus, postoperative 02/21/2015  . CKD (chronic kidney disease) stage 3, GFR 30-59 ml/min 11/07/2014  . UTI (urinary tract infection) 02/08/2014  . Acid reflux 12/26/2013  . Irritable bowel syndrome with constipation 12/26/2013  . Welcome to Medicare preventive visit 10/21/2013  . Hyperlipidemia LDL goal <100 07/15/2013  . Obesity 07/15/2013  . Chronic venous insufficiency 03/21/2013  . DM (diabetes mellitus), type 2 with renal complications ([place]) [id number]  . Lack of libido 11/05/2012  . Barrett's esophagus 11/05/2012  . Hiatal hernia 11/05/2012  . Essential hypertension 11/05/2012  . Urinary incontinence    Current Outpatient Prescriptions on File Prior to Visit  Medication Sig Dispense Refill  . ACCU-CHEK AVIVA PLUS test strip Apply 1 each topically daily.    Marland Kitchen albuterol (PROVENTIL HFA;VENTOLIN HFA) 108 (90 BASE) MCG/ACT inhaler Inhale 2 puffs into the lungs every 6 (six) hours as needed for wheezing.    Marland Kitchen aspirin EC 81 MG tablet Take 81 mg by mouth daily.    Marland Kitchen atorvastatin (LIPITOR) 40 MG tablet TAKE 1 TABLET BY MOUTH  DAILY AT 6 PM. 60 tablet 2  . azelastine (ASTEPRO) 137 MCG/SPRAY nasal spray Place 1 spray into the nose 2 (two) times daily. Use in each nostril as directed (Patient taking differently: Place 1 spray into the nose daily. Use in each nostril as  directed) 30 mL 11  . azithromycin (ZITHROMAX) 250 MG tablet Take by mouth daily.    Marland Kitchen BIOTIN PO Take by mouth. Take 1000mg  by mouth twice daily.    . Black Cohosh (REMIFEMIN) 20 MG TABS Take 20 mg by mouth 2 (two) times daily.     . Calcium Carbonate-Vitamin D (CALCIUM 600 + D PO) Take 1 tablet by mouth daily.    . Cholecalciferol (D3-1000 PO) Take 1,000 Int'l Units by mouth every morning.     . fish oil-omega-3 fatty acids 1000 MG capsule Take 1 capsule by mouth daily.    . fluticasone (FLONASE) 50 MCG/ACT nasal spray Place 1 spray into both nostrils daily. 48 g 1  . furosemide (LASIX) 20 MG tablet Take 1 tablet by mouth  every other day 45 tablet 1  . glipiZIDE (GLUCOTROL XL) 10 MG 24 hr tablet Take 1 tablet by mouth  daily 60 tablet 2  . Lancet Devices (ACCU-CHEK SOFTCLIX) lancets Use to test blood sugars 1 -2 times daily 1 each 3  . lisinopril (PRINIVIL,ZESTRIL) 20 MG tablet Take 1 tablet (20 mg total) by mouth daily. 90 tablet 1  . Magnesium Oxide -Mg Supplement 250 MG TABS Take 250 mg by mouth daily.    . Meclizine HCl 25 MG CHEW Chew 25 mg by mouth daily as needed (for vertigo).     . metFORMIN (GLUCOPHAGE XR) 750 MG 24 hr tablet Take 2 tablets (1,500 mg total) by mouth daily with  breakfast. 180 tablet 1  . Multiple Vitamin (MULTIVITAMIN) tablet Take 1 tablet by mouth daily.    Marland Kitchen omeprazole (PRILOSEC) 20 MG capsule Take 1 capsule (20 mg total) by mouth daily. 90 capsule 1  . Potassium 99 MG TABS Take 1 tablet by mouth daily.    Marland Kitchen senna-docusate (SENOKOT-S) 8.6-50 MG per tablet Take 1 tablet by mouth daily. 30 tablet 0  . sertraline (ZOLOFT) 50 MG tablet Take 1 tablet (50 mg total) by mouth daily. 90 tablet 1  . sitaGLIPtin (JANUVIA) 50 MG tablet Take 1 tablet (50 mg total) by mouth daily. 90 tablet 1  . verapamil (VERELAN PM) 180 MG 24 hr capsule Take 1 capsule (180 mg total) by mouth daily. 90 capsule 1   No current facility-administered medications on file prior to visit.    Allergies  Allergen Reactions  . Clindamycin/Lincomycin Rash  . Codeine Rash  . Morphine And Related Nausea Only  . Talwin [Pentazocine] Nausea And Vomiting  . Oxycodone-Acetaminophen Other (See Comments)    Pt states this medication makes her bowels stop moving  . Cleocin [Clindamycin Hcl] Rash   Objective: General: Patient is awake, alert, and oriented x 3 and in no acute distress.  Integument: Skin is warm, dry and supple bilateral. Nails are short. No signs of infection. + Corn to dorsal PIPJ right 5th toe. Old surgical scars well healed. Remaining integument unremarkable.  Vasculature:  Dorsalis Pedis pulse 2/4 bilateral. Posterior Tibial pulse  1/4 bilateral.  Capillary fill time <3 sec 1-5 bilateral. Positive hair growth to the level of the digits. Temperature gradient within normal limits. No varicosities present bilateral. No edema present bilateral.   Neurology: The patient has intact sensation measured with a 5.07/10g Semmes Weinstein Monofilament at all pedal sites bilateral . Vibratory sensation slightly diminished bilateral with tuning fork. No Babinski sign present bilateral.   Musculoskeletal: Asymptomatic hammertoe gross pedal deformities noted bilateral. Muscular strength 5/5 in all lower extremity muscular groups bilateral without pain on range of motion . No tenderness with calf compression bilateral.  Assessment and Plan: Problem List Items Addressed This Visit    None    Visit Diagnoses    Corns and callosities    -  Primary    Pain of toe, unspecified laterality        Diabetes mellitus without complication ([place])          -Examined patient. -Discussed and educated patient on diabetic foot care, especially with  regards to the vascular, neurological and musculoskeletal systems.  -Stressed the importance of good glycemic control and the detriment of not  controlling glucose levels in relation to the foot. -Mechanically debrided corn x 1 using sterile  chisel blade without incident  -Cont with silicone toe cap to protect 5th toe -Advised patient to cont with use wider shoes -Answered all patient questions -Patient to return in 3 months for at risk foot care; callus and nail care if needed -Patient advised to call the office if any problems or questions arise in the meantime.  Landis Martins, DPM

## 2016-01-28 ENCOUNTER — Encounter: Payer: Self-pay | Admitting: Internal Medicine

## 2016-01-30 ENCOUNTER — Other Ambulatory Visit: Payer: Self-pay | Admitting: Internal Medicine

## 2016-01-30 MED ORDER — AMLODIPINE BESYLATE 5 MG PO TABS: 5.0000 mg | ORAL_TABLET | Freq: Every day | ORAL | Status: DC

## 2016-03-06 ENCOUNTER — Other Ambulatory Visit: Payer: Self-pay | Admitting: Internal Medicine

## 2016-03-07 ENCOUNTER — Other Ambulatory Visit: Payer: Self-pay | Admitting: Internal Medicine

## 2016-03-07 MED ORDER — ACCU-CHEK AVIVA PLUS VI STRP: ORAL_STRIP | Status: DC

## 2016-03-07 MED ORDER — FLUTICASONE PROPIONATE 50 MCG/ACT NA SUSP: 1.0000 | Freq: Every day | NASAL | Status: DC

## 2016-03-18 ENCOUNTER — Telehealth: Payer: Self-pay | Admitting: *Deleted

## 2016-03-18 ENCOUNTER — Other Ambulatory Visit (INDEPENDENT_AMBULATORY_CARE_PROVIDER_SITE_OTHER): Payer: Medicare Other

## 2016-03-18 ENCOUNTER — Encounter: Payer: Self-pay | Admitting: Internal Medicine

## 2016-03-18 DIAGNOSIS — R300 Dysuria: Secondary | ICD-10-CM | POA: Diagnosis not present

## 2016-03-18 LAB — POCT URINALYSIS DIPSTICK
Bilirubin, UA: NEGATIVE
Glucose, UA: NEGATIVE
Ketones, UA: NEGATIVE
Nitrite, UA: NEGATIVE
Protein, UA: 30
Spec Grav, UA: 1.025
Urobilinogen, UA: 0.2
pH, UA: 5.5

## 2016-03-18 LAB — URINALYSIS, ROUTINE W REFLEX MICROSCOPIC
Bilirubin Urine: NEGATIVE
Ketones, ur: NEGATIVE
Nitrite: NEGATIVE
Specific Gravity, Urine: 1.025 (ref 1.000–1.030)
Urine Glucose: NEGATIVE
Urobilinogen, UA: 0.2 (ref 0.0–1.0)
pH: 5.5 (ref 5.0–8.0)

## 2016-03-18 MED ORDER — CIPROFLOXACIN HCL 250 MG PO TABS: 250.0000 mg | ORAL_TABLET | Freq: Two times a day (BID) | ORAL | Status: DC

## 2016-03-18 NOTE — Addendum Note (Signed)
Addended by: Crecencio Mc on: 03/18/2016 02:35 PM   Modules accepted: Orders

## 2016-03-18 NOTE — Telephone Encounter (Signed)
Patient stated that she feels she has a UTI, she would like to be worked in by a physician today. Please advise a place to put pt if possible.  Pt contact [phone]

## 2016-03-18 NOTE — Telephone Encounter (Signed)
Is there anyway to put patient in today?

## 2016-03-18 NOTE — Telephone Encounter (Signed)
Scheduled patient to come to office for formal UA and culture with POCT also. Per MD.

## 2016-03-21 LAB — URINE CULTURE: Colony Count: >=100000

## 2016-03-22 ENCOUNTER — Encounter: Payer: Self-pay | Admitting: Internal Medicine

## 2016-04-11 ENCOUNTER — Ambulatory Visit: Payer: 59 | Admitting: Internal Medicine

## 2016-04-25 ENCOUNTER — Other Ambulatory Visit: Payer: Self-pay | Admitting: Internal Medicine

## 2016-04-28 ENCOUNTER — Encounter: Payer: Self-pay | Admitting: Internal Medicine

## 2016-04-28 ENCOUNTER — Ambulatory Visit (INDEPENDENT_AMBULATORY_CARE_PROVIDER_SITE_OTHER): Payer: Medicare Other | Admitting: Internal Medicine

## 2016-04-28 VITALS — BP 130/74 | HR 71 | Temp 97.9°F | Ht 61.0 in | Wt 178.2 lb

## 2016-04-28 DIAGNOSIS — E119 Type 2 diabetes mellitus without complications: Secondary | ICD-10-CM

## 2016-04-28 DIAGNOSIS — I10 Essential (primary) hypertension: Secondary | ICD-10-CM | POA: Diagnosis not present

## 2016-04-28 DIAGNOSIS — E669 Obesity, unspecified: Secondary | ICD-10-CM

## 2016-04-28 DIAGNOSIS — E785 Hyperlipidemia, unspecified: Secondary | ICD-10-CM

## 2016-04-28 DIAGNOSIS — E1121 Type 2 diabetes mellitus with diabetic nephropathy: Secondary | ICD-10-CM

## 2016-04-28 LAB — COMPREHENSIVE METABOLIC PANEL
ALT: 18 U/L (ref 0–35)
AST: 19 U/L (ref 0–37)
Albumin: 4.2 g/dL (ref 3.5–5.2)
Alkaline Phosphatase: 94 U/L (ref 39–117)
BUN: 16 mg/dL (ref 6–23)
CO2: 26 mEq/L (ref 19–32)
Calcium: 9.3 mg/dL (ref 8.4–10.5)
Chloride: 105 mEq/L (ref 96–112)
Creatinine, Ser: 0.86 mg/dL (ref 0.40–1.20)
GFR: 68.93 mL/min (ref >60.00)
Glucose, Bld: 138 mg/dL — ABNORMAL HIGH (ref 70–99)
Potassium: 3.6 mEq/L (ref 3.5–5.1)
Sodium: 139 mEq/L (ref 135–145)
Total Bilirubin: 0.5 mg/dL (ref 0.2–1.2)
Total Protein: 7.4 g/dL (ref 6.0–8.3)

## 2016-04-28 LAB — LIPID PANEL
Cholesterol: 188 mg/dL (ref 0–200)
HDL: 92.4 mg/dL (ref >39.00)
LDL Cholesterol: 77 mg/dL (ref 0–99)
NonHDL: 95.68
Total CHOL/HDL Ratio: 2
Triglycerides: 94 mg/dL (ref 0.0–149.0)
VLDL: 18.8 mg/dL (ref 0.0–40.0)

## 2016-04-28 LAB — MICROALBUMIN / CREATININE URINE RATIO
Creatinine,U: 176.2 mg/dL
Microalb Creat Ratio: 1.4 mg/g (ref 0.0–30.0)
Microalb, Ur: 2.4 mg/dL — ABNORMAL HIGH (ref 0.0–1.9)

## 2016-04-28 LAB — HEMOGLOBIN A1C: Hgb A1c MFr Bld: 7.2 % — ABNORMAL HIGH (ref 4.6–6.5)

## 2016-04-28 LAB — LDL CHOLESTEROL, DIRECT: Direct LDL: 74 mg/dL

## 2016-04-28 NOTE — Patient Instructions (Addendum)
I'm glad you are back on track with you diet   Try adding nutritional yeast to your popcorn instead of salt or using powdered cheese   To make a low carb chip :  Take the Joseph's Lavash or Pita bread,  Or the [place] Low carb whole wheat tortilla   Place on metal cookie sheet  Brush with olive oil  Sprinkle garlic powder (NOT garlic salt), grated parmesan cheese, mediterranean seasoning , or all of them?  Bake at 275 for 30 minutes   We have substitutions for your potatoes!!  Try the mashed cauliflower and riced cauliflower dishes instead of rice and mashed potatoes  Mashed turnips are also very low carb!   For desserts :  Try the Dannon Lt n Fit greek yogurt dessert flavors and top with reddi Whip .  8 carbs,  80 calories  Try Oikos Triple Zero [place] Yogurt in the salted caramel, and the coffee flavors  With Whipped Cream for dessert  breyer's low carb ice cream, available in bars (on a stick, better ) or scoopable ice cream  HERE ARE THE LOW CARB  BREAD CHOICES       Your ultimate goal weight is 158 lbs ( to get BMI < 30)

## 2016-04-28 NOTE — Progress Notes (Signed)
Subjective:  Patient ID: Sandra Hendrix, female    DOB: 1946-06-21  Age: 70 y.o. MRN: [id number]  CC: The primary encounter diagnosis was Controlled type 2 diabetes mellitus without complication, without long-term current use of insulin ([place]). Diagnoses of Hyperlipidemia, Type 2 diabetes mellitus with diabetic nephropathy, without long-term current use of insulin ([place]), Hyperlipidemia LDL goal <100, Essential hypertension, and Obesity were also pertinent to this visit.  [name] presents for follow up on Type 2 DM , hyperlipidemia and hypertension. She feels generally well, is Walking  several times per week and checking blood sugars once daily at variable times.  BS have been under 130 fasting and < 160 post prandially.  Denies any recent hypoglyemic events.  Taking her medications as directed. Has not been following a carbohydrate modified diet fo r th emonth of June due to [place] with family , but has been "back on track" for the last month. Denies numbness, burning and tingling of extremities. Appetite is good.   Left leg aches at the end of day  Entire femur feels like a cramp more on the medial side than th elateral side.  Does not occur during walkng   Outpatient Medications Prior to Visit  Medication Sig Dispense Refill  . ACCU-CHEK AVIVA PLUS test strip Test once daily 100 each 2  . albuterol (PROVENTIL HFA;VENTOLIN HFA) 108 (90 BASE) MCG/ACT inhaler Inhale 2 puffs into the lungs every 6 (six) hours as needed for wheezing.    Marland Kitchen amLODipine (NORVASC) 5 MG tablet Take 1 tablet by mouth  daily 90 tablet 2  . aspirin EC 81 MG tablet Take 81 mg by mouth daily.    Marland Kitchen atorvastatin (LIPITOR) 40 MG tablet TAKE 1 TABLET BY MOUTH  DAILY AT 6 PM. 60 tablet 2  . azelastine (ASTEPRO) 137 MCG/SPRAY nasal spray Place 1 spray into the nose 2 (two) times daily. Use in each nostril as directed (Patient taking differently: Place 1 spray into the nose daily. Use in each nostril as  directed) 30 mL 11  . BIOTIN PO Take by mouth. Take 1086m by mouth twice daily.    . Black Cohosh (REMIFEMIN) 20 MG TABS Take 20 mg by mouth 2 (two) times daily.     . Calcium Carbonate-Vitamin D (CALCIUM 600 + D PO) Take 1 tablet by mouth daily.    . Cholecalciferol (D3-1000 PO) Take 1,000 Int'l Units by mouth every morning.     . fish oil-omega-3 fatty acids 1000 MG capsule Take 1 capsule by mouth daily.    . fluticasone (FLONASE) 50 MCG/ACT nasal spray Place 1 spray into both nostrils daily. 48 g 1  . furosemide (LASIX) 20 MG tablet Take 1 tablet by mouth  every other day 45 tablet 1  . glipiZIDE (GLUCOTROL XL) 10 MG 24 hr tablet Take 1 tablet by mouth  daily 60 tablet 2  . JANUVIA 50 MG tablet Take 1 tablet by mouth  daily 90 tablet 0  . Lancet Devices (ACCU-CHEK SOFTCLIX) lancets Use to test blood sugars 1 -2 times daily 1 each 3  . Magnesium Oxide -Mg Supplement 250 MG TABS Take 250 mg by mouth daily.    . Meclizine HCl 25 MG CHEW Chew 25 mg by mouth daily as needed (for vertigo).     . metFORMIN (GLUCOPHAGE-XR) 750 MG 24 hr tablet Take 2 tablets by mouth  daily with breakfast 180 tablet 2  . Multiple Vitamin (MULTIVITAMIN) tablet Take 1 tablet by mouth  daily.    . omeprazole (PRILOSEC) 20 MG capsule Take 1 capsule by mouth  daily 90 capsule 2  . Potassium 99 MG TABS Take 1 tablet by mouth daily.    Marland Kitchen senna-docusate (SENOKOT-S) 8.6-50 MG per tablet Take 1 tablet by mouth daily. 30 tablet 0  . sertraline (ZOLOFT) 50 MG tablet Take 1 tablet by mouth  daily 90 tablet 2  . verapamil (VERELAN PM) 180 MG 24 hr capsule Take 1 capsule by mouth  daily 90 capsule 2  . azithromycin (ZITHROMAX) 250 MG tablet Take by mouth daily.    . ciprofloxacin (CIPRO) 250 MG tablet Take 1 tablet (250 mg total) by mouth 2 (two) times daily. 10 tablet 0   No facility-administered medications prior to visit.     Review of Systems;  Patient denies headache, fevers, malaise, unintentional weight loss, skin  rash, eye pain, sinus congestion and sinus pain, sore throat, dysphagia,  hemoptysis , cough, dyspnea, wheezing, chest pain, palpitations, orthopnea, edema, abdominal pain, nausea, melena, diarrhea, constipation, flank pain, dysuria, hematuria, urinary  Frequency, nocturia, numbness, tingling, seizures,  Focal weakness, Loss of consciousness,  Tremor, insomnia, depression, anxiety, and suicidal ideation.      Objective:  BP 130/74   Pulse 71   Temp 97.9 F (36.6 C) (Oral)   Ht '5\' 1"'  (1.549 m)   Wt 178 lb 4 oz (80.9 kg)   SpO2 95%   BMI 33.68 kg/m   BP Readings from Last 3 Encounters:  04/28/16 130/74  01/04/16 118/74  10/26/15 118/72    Wt Readings from Last 3 Encounters:  04/28/16 178 lb 4 oz (80.9 kg)  01/04/16 180 lb 8 oz (81.9 kg)  10/26/15 177 lb 1.9 oz (80.3 kg)    General appearance: alert, cooperative and appears stated age Ears: normal TM's and external ear canals both ears Throat: lips, mucosa, and tongue normal; teeth and gums normal Neck: no adenopathy, no carotid bruit, supple, symmetrical, trachea midline and thyroid not enlarged, symmetric, no tenderness/mass/nodules Back: symmetric, no curvature. ROM normal. No CVA tenderness. Lungs: clear to auscultation bilaterally Heart: regular rate and rhythm, S1, S2 normal, no murmur, click, rub or gallop Abdomen: soft, non-tender; bowel sounds normal; no masses,  no organomegaly Pulses: 2+ and symmetric Skin: Skin color, texture, turgor normal. No rashes or lesions Lymph nodes: Cervical, supraclavicular, and axillary nodes normal.  Lab Results  Component Value Date   HGBA1C 7.2 (H) 04/28/2016   HGBA1C 7.2 (H) 12/28/2015   HGBA1C 7.9 (H) 09/08/2015    Lab Results  Component Value Date   CREATININE 0.86 04/28/2016   CREATININE 1.01 12/28/2015   CREATININE 0.98 09/08/2015    Lab Results  Component Value Date   WBC 5.0 02/24/2015   HGB 11.3 (L) 02/24/2015   HCT 34.6 (L) 02/24/2015   PLT 281 02/24/2015    GLUCOSE 138 (H) 04/28/2016   CHOL 188 04/28/2016   TRIG 94.0 04/28/2016   HDL 92.40 04/28/2016   LDLDIRECT 74.0 04/28/2016   LDLCALC 77 04/28/2016   ALT 18 04/28/2016   AST 19 04/28/2016   NA 139 04/28/2016   K 3.6 04/28/2016   CL 105 04/28/2016   CREATININE 0.86 04/28/2016   BUN 16 04/28/2016   CO2 26 04/28/2016   TSH 2.69 11/07/2014   INR 1.1 01/23/2009   HGBA1C 7.2 (H) 04/28/2016   MICROALBUR 2.4 (H) 04/28/2016    Ct Abdomen Pelvis W Contrast  Result Date: 02/21/2015 CLINICAL DATA:  Rotator cuff surgery on Tuesday,  now feeling constipated with vomiting. EXAM: CT ABDOMEN AND PELVIS WITH CONTRAST TECHNIQUE: Multidetector CT imaging of the abdomen and pelvis was performed using the standard protocol following bolus administration of intravenous contrast. CONTRAST:  19m OMNIPAQUE IOHEXOL 300 MG/ML  SOLN COMPARISON:  None. FINDINGS: BODY WALL: No contributory findings. LOWER CHEST: Bibasilar atelectasis. Small hiatal hernia with large gastroesophageal reflux. ABDOMEN/PELVIS: Liver: No focal abnormality. Biliary: Cholecystectomy.  No bile duct enlargement Pancreas: Unremarkable. Spleen: Unremarkable. Adrenals: Unremarkable. Kidneys and ureters: No hydronephrosis or stone. Bladder: Unremarkable. Reproductive: 3.4 cm simple appearing cyst in the left ovary. Bowel: There is dilated fluid-filled proximal small bowel which gradually decreases in caliber without a discrete transition point. The colon is not decompressed, but rather diffusely contains stool. No indication of bowel necrosis or perforation. There is only mild interloop fluid in the central mesenteries. Normal appendix. Retroperitoneum: No mass or adenopathy. Peritoneum: No ascites or pneumoperitoneum. Vascular: No acute abnormality. OSSEOUS: No acute abnormalities. IMPRESSION: 1. Dilated small bowel. Lack of transition point favors reactive ileus over partial obstruction. 2. 34 mm left ovarian cyst. Recommend 1 year followup pelvic  ultrasound. Electronically Signed   By: JMonte FantasiaM.D.   On: 02/21/2015 04:18    Assessment & Plan:   Problem List Items Addressed This Visit    DM (diabetes mellitus), type 2 with renal complications ([place] (Chronic)    Maintaining control despite a month of nonadherence to diet   Diet reviewed and advice given. Continue ccurrent medications.   Lab Results  Component Value Date   HGBA1C 7.2 (H) 04/28/2016   Lab Results  Component Value Date   MICROALBUR 2.4 (H) 04/28/2016          Essential hypertension    BP 130/74   Pulse 71   Temp 97.9 F (36.6 C) (Oral)   Ht '5\' 1"'  (1.549 m)   Wt 178 lb 4 oz (80.9 kg)   SpO2 95%   BMI 33.68 kg/m   Well controlled on current regimen. Renal function stable, no changes today. ,\ Lab Results  Component Value Date   CREATININE 0.86 04/28/2016   BUN 16 04/28/2016   NA 139 04/28/2016   K 3.6 04/28/2016   CL 105 04/28/2016   CO2 26 04/28/2016         Hyperlipidemia LDL goal <100    LDL and triglycerides are at goal on atorvastatin. SHe has no side effects and liver enzymes are normal. No changes today    Lab Results  Component Value Date   CHOL 188 04/28/2016   HDL 92.40 04/28/2016   LDLCALC 77 04/28/2016   LDLDIRECT 74.0 04/28/2016   TRIG 94.0 04/28/2016   CHOLHDL 2 04/28/2016   Lab Results  Component Value Date   ALT 18 04/28/2016   AST 19 04/28/2016   ALKPHOS 94 04/28/2016   BILITOT 0.5 04/28/2016           Obesity    I have addressed  BMI and recommended wt loss of 10% of body weight over the next 6 months using a low glycemic index diet and regular exercise a minimum of 5 days per week.         Other Visit Diagnoses    Controlled type 2 diabetes mellitus without complication, without long-term current use of insulin ([place]    -  Primary   Relevant Orders   Hemoglobin A1c (Completed)   Comp Met (CMET) (Completed)   Lipid panel (Completed)   Microalbumin / creatinine urine ratio (Completed)  Hyperlipidemia       Relevant Orders   LDL cholesterol, direct (Completed)    .A total of 25 minutes of face to face time was spent with patient more than half of which was spent in counselling and coordination of care    I have discontinued Ms. Raptis's azithromycin and ciprofloxacin. I am also having her maintain her Cholecalciferol (D3-1000 PO), BIOTIN PO, multivitamin, Calcium Carbonate-Vitamin D (CALCIUM 600 + D PO), Potassium, fish oil-omega-3 fatty acids, Black Cohosh, Meclizine HCl, albuterol, azelastine, accu-chek softclix, aspirin EC, Magnesium Oxide -Mg Supplement, senna-docusate, furosemide, glipiZIDE, atorvastatin, amLODipine, verapamil, omeprazole, sertraline, metFORMIN, JANUVIA, fluticasone, and ACCU-CHEK AVIVA PLUS.  No orders of the defined types were placed in this encounter.   Medications Discontinued During This Encounter  Medication Reason  . azithromycin (ZITHROMAX) 250 MG tablet Completed Course  . ciprofloxacin (CIPRO) 250 MG tablet Completed Course    Follow-up: Return in about 3 months (around 07/29/2016) for follow up diabetes.   Crecencio Mc, MD

## 2016-04-29 ENCOUNTER — Encounter: Payer: Self-pay | Admitting: Internal Medicine

## 2016-04-29 ENCOUNTER — Ambulatory Visit: Payer: Medicare Other | Admitting: Sports Medicine

## 2016-05-01 NOTE — Assessment & Plan Note (Signed)
I have addressed  BMI and recommended wt loss of 10% of body weight over the next 6 months using a low glycemic index diet and regular exercise a minimum of 5 days per week.   

## 2016-05-01 NOTE — Assessment & Plan Note (Signed)
LDL and triglycerides are at goal on atorvastatin. SHe has no side effects and liver enzymes are normal. No changes today    Lab Results  Component Value Date   CHOL 188 04/28/2016   HDL 92.40 04/28/2016   LDLCALC 77 04/28/2016   LDLDIRECT 74.0 04/28/2016   TRIG 94.0 04/28/2016   CHOLHDL 2 04/28/2016   Lab Results  Component Value Date   ALT 18 04/28/2016   AST 19 04/28/2016   ALKPHOS 94 04/28/2016   BILITOT 0.5 04/28/2016

## 2016-05-01 NOTE — Assessment & Plan Note (Signed)
Maintaining control despite a month of nonadherence to diet   Diet reviewed and advice given. Continue ccurrent medications.   Lab Results  Component Value Date   HGBA1C 7.2 (H) 04/28/2016   Lab Results  Component Value Date   MICROALBUR 2.4 (H) 04/28/2016

## 2016-05-01 NOTE — Assessment & Plan Note (Signed)
BP 130/74   Pulse 71   Temp 97.9 F (36.6 C) (Oral)   Ht 5\' 1"  (1.549 m)   Wt 178 lb 4 oz (80.9 kg)   SpO2 95%   BMI 33.68 kg/m  Well controlled on current regimen. Renal function stable, no changes today. ,\ Lab Results  Component Value Date   CREATININE 0.86 04/28/2016   BUN 16 04/28/2016   NA 139 04/28/2016   K 3.6 04/28/2016   CL 105 04/28/2016   CO2 26 04/28/2016

## 2016-05-13 ENCOUNTER — Other Ambulatory Visit: Payer: Self-pay | Admitting: Internal Medicine

## 2016-06-13 ENCOUNTER — Encounter: Payer: Self-pay | Admitting: *Deleted

## 2016-06-14 ENCOUNTER — Ambulatory Visit: Payer: Medicare Other | Admitting: Anesthesiology

## 2016-06-14 ENCOUNTER — Encounter
Admission: RE | Disposition: A | Discharge status: Home / Self Care | Payer: Self-pay | Source: Ambulatory Visit | Attending: Gastroenterology

## 2016-06-14 ENCOUNTER — Ambulatory Visit
Admission: RE | Admit: 2016-06-14 | Discharge: 2016-06-14 | Disposition: A | Discharge status: Home / Self Care | Payer: Medicare Other | Source: Ambulatory Visit | Attending: Gastroenterology | Admitting: Gastroenterology

## 2016-06-14 DIAGNOSIS — Z1211 Encounter for screening for malignant neoplasm of colon: Secondary | ICD-10-CM | POA: Insufficient documentation

## 2016-06-14 DIAGNOSIS — K640 First degree hemorrhoids: Secondary | ICD-10-CM | POA: Insufficient documentation

## 2016-06-14 DIAGNOSIS — Z7982 Long term (current) use of aspirin: Secondary | ICD-10-CM | POA: Insufficient documentation

## 2016-06-14 DIAGNOSIS — F329 Major depressive disorder, single episode, unspecified: Secondary | ICD-10-CM | POA: Insufficient documentation

## 2016-06-14 DIAGNOSIS — Z800 Family history of malignant neoplasm of digestive organs: Secondary | ICD-10-CM | POA: Diagnosis not present

## 2016-06-14 DIAGNOSIS — E785 Hyperlipidemia, unspecified: Secondary | ICD-10-CM | POA: Diagnosis not present

## 2016-06-14 DIAGNOSIS — K449 Diaphragmatic hernia without obstruction or gangrene: Secondary | ICD-10-CM | POA: Insufficient documentation

## 2016-06-14 DIAGNOSIS — Z79899 Other long term (current) drug therapy: Secondary | ICD-10-CM | POA: Insufficient documentation

## 2016-06-14 DIAGNOSIS — J449 Chronic obstructive pulmonary disease, unspecified: Secondary | ICD-10-CM | POA: Insufficient documentation

## 2016-06-14 DIAGNOSIS — K5730 Diverticulosis of large intestine without perforation or abscess without bleeding: Secondary | ICD-10-CM | POA: Insufficient documentation

## 2016-06-14 DIAGNOSIS — I10 Essential (primary) hypertension: Secondary | ICD-10-CM | POA: Insufficient documentation

## 2016-06-14 DIAGNOSIS — Z09 Encounter for follow-up examination after completed treatment for conditions other than malignant neoplasm: Secondary | ICD-10-CM | POA: Diagnosis not present

## 2016-06-14 DIAGNOSIS — Z7984 Long term (current) use of oral hypoglycemic drugs: Secondary | ICD-10-CM | POA: Diagnosis not present

## 2016-06-14 DIAGNOSIS — K2270 Barrett's esophagus without dysplasia: Secondary | ICD-10-CM | POA: Insufficient documentation

## 2016-06-14 DIAGNOSIS — K219 Gastro-esophageal reflux disease without esophagitis: Secondary | ICD-10-CM | POA: Diagnosis not present

## 2016-06-14 DIAGNOSIS — K228 Other specified diseases of esophagus: Secondary | ICD-10-CM | POA: Diagnosis not present

## 2016-06-14 DIAGNOSIS — E119 Type 2 diabetes mellitus without complications: Secondary | ICD-10-CM | POA: Diagnosis not present

## 2016-06-14 HISTORY — PX: ESOPHAGOGASTRODUODENOSCOPY (EGD) WITH PROPOFOL: SHX5813

## 2016-06-14 HISTORY — PX: COLONOSCOPY WITH PROPOFOL: SHX5780

## 2016-06-14 LAB — GLUCOSE, CAPILLARY: Glucose-Capillary: 174 mg/dL — ABNORMAL HIGH (ref 65–99)

## 2016-06-14 SURGERY — ESOPHAGOGASTRODUODENOSCOPY (EGD) WITH PROPOFOL
Anesthesia: General

## 2016-06-14 MED ORDER — GLYCOPYRROLATE 0.2 MG/ML IJ SOLN
INTRAMUSCULAR | Status: DC | PRN
  Administered 2016-06-14: 0.1 mg via INTRAVENOUS

## 2016-06-14 MED ORDER — LIDOCAINE HCL (CARDIAC) 20 MG/ML IV SOLN
INTRAVENOUS | Status: DC | PRN
  Administered 2016-06-14: 50 mg via INTRAVENOUS

## 2016-06-14 MED ORDER — PROPOFOL 500 MG/50ML IV EMUL
INTRAVENOUS | Status: DC | PRN
  Administered 2016-06-14: 50 ug/kg/min via INTRAVENOUS

## 2016-06-14 MED ORDER — SODIUM CHLORIDE 0.9 % IV SOLN
INTRAVENOUS | Status: DC
  Administered 2016-06-14: 1000 mL via INTRAVENOUS
  Administered 2016-06-14: 10:00:00 via INTRAVENOUS

## 2016-06-14 MED ORDER — SODIUM CHLORIDE 0.9 % IV SOLN: INTRAVENOUS | Status: DC

## 2016-06-14 MED ORDER — SODIUM CHLORIDE 0.9 % IV SOLN
2.0000 g | Freq: Once | INTRAVENOUS | Status: AC
  Administered 2016-06-14: 2 g via INTRAVENOUS
  Filled 2016-06-14: qty 2000

## 2016-06-14 NOTE — OR Nursing (Signed)
Unable to complete EGD due to pt having a strong gag reflex with lots of coughing and mucous. EGD aborted, pt suctioned and stable at present.

## 2016-06-14 NOTE — Op Note (Signed)
[facility] Patient Name: Sandra Hendrix Procedure Date: 06/14/2016 10:04 AM MRN: [id number] Account #: [id number] Date of Birth: 01/15/1946 Admit Type: Outpatient Age: 70 Room: [facility] ENDO ROOM 3 Gender: Female Note Status: Finalized Procedure:            Colonoscopy Indications:          Family history of colon cancer in multiple                        second-degree relatives Providers:            Lollie Sails, MD Referring MD:         Deborra Medina, MD (Referring MD) Medicines:            Monitored Anesthesia Care Complications:        No immediate complications. Procedure:            Pre-Anesthesia Assessment:                       - ASA Grade Assessment: III - A patient with severe                        systemic disease.                       After obtaining informed consent, the colonoscope was                        passed under direct vision. Throughout the procedure,                        the patient's blood pressure, pulse, and oxygen                        saturations were monitored continuously. The                        Colonoscope was introduced through the anus and                        advanced to the the cecum, identified by appendiceal                        orifice and ileocecal valve. The colonoscopy was                        performed with moderate difficulty due to significant                        looping and a tortuous colon. Successful completion of                        the procedure was aided by changing the patient to a                        prone position and using manual pressure. The quality                        of the bowel preparation was good. Findings:      Multiple small to medium diverticula were found in the sigmoid colon,  descending colon, transverse colon and ascending colon.      Non-bleeding internal hemorrhoids were found during anoscopy. The       hemorrhoids were small and Grade I  (internal hemorrhoids that do not       prolapse).      The digital rectal exam was normal.      The exam was otherwise without abnormality. Impression:           - Diverticulosis in the sigmoid colon, in the                        descending colon, in the transverse colon and in the                        ascending colon.                       - Non-bleeding internal hemorrhoids.                       - The examination was otherwise normal.                       - No specimens collected. Recommendation:       - Discharge patient to home. Procedure Code(s):    --- Professional ---                       [phone], Colonoscopy, flexible; diagnostic, including                        collection of specimen(s) by brushing or washing, when                        performed (separate procedure) Diagnosis Code(s):    --- Professional ---                       K64.0, First degree hemorrhoids                       Z80.0, Family history of malignant neoplasm of                        digestive organs                       K57.30, Diverticulosis of large intestine without                        perforation or abscess without bleeding CPT copyright 2016 [employer]. All rights reserved. The codes documented in this report are preliminary and upon coder review may  be revised to meet current compliance requirements. Lollie Sails, MD 06/14/2016 11:14:08 AM This report has been signed electronically. Number of Addenda: 0 Note Initiated On: 06/14/2016 10:04 AM Total Procedure Duration: 0 hours 19 minutes 38 seconds       [facility]

## 2016-06-14 NOTE — H&P (Signed)
Outpatient short stay form Pre-procedure 06/14/2016 10:12 AM Sandra Sails MD  Primary Physician: Dr. Deborra Medina  Reason for visit:  EGD and colonoscopy  History of present illness:  Patient is a 70 year old female presenting today as above. She has personal history of Barrett's esophagus and does take omeprazole 20 mg daily for this. She has no dysphagia and no heartburn symptoms. She is also presenting for a colon cancer screening due to family history. She tolerated her prep well. She takes 81 mg aspirin but no other blood thinning agents or aspirin products. He last took that about 2 days ago.  It is of note that she recently fell and has multiple ecchymosis over the lower abdomen this is currently nontender. She seen no blood in her stool.    Current Facility-Administered Medications:  .  0.9 %  sodium chloride infusion, , Intravenous, Continuous, Sandra Sails, MD, Last Rate: 20 mL/hr at 06/14/16 0931, 1,000 mL at 06/14/16 0931 .  0.9 %  sodium chloride infusion, , Intravenous, Continuous, Sandra Sails, MD  Prescriptions Prior to Admission  Medication Sig Dispense Refill Last Dose  . ACCU-CHEK AVIVA PLUS test strip Test once daily 100 each 2 06/13/2016 at Unknown time  . albuterol (PROVENTIL HFA;VENTOLIN HFA) 108 (90 BASE) MCG/ACT inhaler Inhale 2 puffs into the lungs every 6 (six) hours as needed for wheezing.   Past Week at Unknown time  . amLODipine (NORVASC) 5 MG tablet Take 1 tablet by mouth  daily 90 tablet 2 06/13/2016 at Unknown time  . aspirin EC 81 MG tablet Take 81 mg by mouth daily.   Past Week at Unknown time  . atorvastatin (LIPITOR) 40 MG tablet TAKE 1 TABLET BY MOUTH  DAILY AT 6 PM. 60 tablet 2 Past Week at Unknown time  . azelastine (ASTEPRO) 137 MCG/SPRAY nasal spray Place 1 spray into the nose 2 (two) times daily. Use in each nostril as directed (Patient taking differently: Place 1 spray into the nose daily. Use in each nostril as directed) 30 mL 11 Past  Week at Unknown time  . BIOTIN PO Take by mouth. Take 1000mg  by mouth twice daily.   Past Week at Unknown time  . Calcium Carbonate-Vitamin D (CALCIUM 600 + D PO) Take 1 tablet by mouth daily.   Past Week at Unknown time  . Cholecalciferol (D3-1000 PO) Take 1,000 Int'l Units by mouth every morning.    Past Week at Unknown time  . fish oil-omega-3 fatty acids 1000 MG capsule Take 1 capsule by mouth daily.   Past Week at Unknown time  . fluticasone (FLONASE) 50 MCG/ACT nasal spray Place 1 spray into both nostrils daily. 48 g 1 Past Week at Unknown time  . furosemide (LASIX) 20 MG tablet Take 1 tablet by mouth  every other day 45 tablet 2 Past Week at Unknown time  . glipiZIDE (GLUCOTROL XL) 10 MG 24 hr tablet Take 1 tablet by mouth  daily 60 tablet 2 Past Week at Unknown time  . JANUVIA 50 MG tablet Take 1 tablet by mouth  daily 90 tablet 0 Past Week at Unknown time  . Lancet Devices (ACCU-CHEK SOFTCLIX) lancets Use to test blood sugars 1 -2 times daily 1 each 3 06/13/2016 at Unknown time  . lisinopril (PRINIVIL,ZESTRIL) 20 MG tablet Take 20 mg by mouth daily.   Past Week at Unknown time  . Magnesium Oxide -Mg Supplement 250 MG TABS Take 250 mg by mouth daily.   Past Week at  Unknown time  . Meclizine HCl 25 MG CHEW Chew 25 mg by mouth daily as needed (for vertigo).    Past Month at Unknown time  . metFORMIN (GLUCOPHAGE-XR) 750 MG 24 hr tablet Take 2 tablets by mouth  daily with breakfast 180 tablet 2 Past Week at Unknown time  . Multiple Vitamin (MULTIVITAMIN) tablet Take 1 tablet by mouth daily.   Past Week at Unknown time  . omeprazole (PRILOSEC) 20 MG capsule Take 1 capsule by mouth  daily 90 capsule 2 06/13/2016 at Unknown time  . Potassium 99 MG TABS Take 1 tablet by mouth daily.   Past Week at Unknown time  . senna-docusate (SENOKOT-S) 8.6-50 MG per tablet Take 1 tablet by mouth daily. 30 tablet 0 Past Week at Unknown time  . sertraline (ZOLOFT) 50 MG tablet Take 1 tablet by mouth  daily 90  tablet 2 Past Week at Unknown time  . verapamil (VERELAN PM) 180 MG 24 hr capsule Take 1 capsule by mouth  daily 90 capsule 2 Past Week at Unknown time  . Black Cohosh (REMIFEMIN) 20 MG TABS Take 20 mg by mouth 2 (two) times daily.    Not Taking at Unknown time     Allergies  Allergen Reactions  . Clindamycin/Lincomycin Rash  . Codeine Rash  . Morphine And Related Nausea Only  . Talwin [Pentazocine] Nausea And Vomiting  . Oxycodone-Acetaminophen Other (See Comments)    Pt states this medication makes her bowels stop moving  . Cleocin [Clindamycin Hcl] Rash     Past Medical History:  Diagnosis Date  . Allergy   . Arthritis   . Depression   . Diabetes mellitus without complication ([place])   . GERD (gastroesophageal reflux disease)   . Hyperlipidemia   . Hypertension   . Urinary incontinence   . Varicose veins     Review of systems:      Physical Exam    Heart and lungs: Regular rate and rhythm without rub or gallop, lungs are bilaterally clear.    HEENT: Normocephalic atraumatic eyes are anicteric    Other:     Pertinant exam for procedure: Soft nontender nondistended bowel sounds positive normoactive.    Planned proceedures: EGD,  I have discussed the risks benefits and complications of procedures to include not limited to bleeding, infection, perforation and the risk of sedation and the patient wishes to proceed.Colonoscopy and indicated procedures    Sandra Sails, MD Gastroenterology 06/14/2016  10:12 AM

## 2016-06-14 NOTE — Op Note (Signed)
[facility] Gastroenterology Patient Name: Sandra Hendrix Procedure Date: 06/14/2016 10:15 AM MRN: [id number] Account #: [id number] Date of Birth: [birth year] Admit Type: Outpatient Age: 70 Room: [facility] ENDO ROOM 3 Gender: Female Note Status: Finalized Procedure:            Upper GI endoscopy Indications:          Follow-up of Barrett's esophagus Providers:            Lollie Sails, MD Referring MD:         Deborra Medina, MD (Referring MD) Medicines:            Monitored Anesthesia Care Complications:        No immediate complications. Procedure:            Pre-Anesthesia Assessment:                       - ASA Grade Assessment: III - A patient with severe                        systemic disease.                       After obtaining informed consent, the endoscope was                        passed under direct vision. Throughout the procedure,                        the patient's blood pressure, pulse, and oxygen                        saturations were monitored continuously. The Endoscope                        was introduced through the mouth, and advanced to the                        body of the stomach. The upper GI endoscopy was                        performed with moderate difficulty due to patient                        intolerance of esophageal intubation. The patient                        tolerated the procedure poorly due to the patient's                        respiratory instability (hypoxia). Findings:      The Z-line was variable.      A medium-sized hiatal hernia was present. The scope had to be removed at       that point.      Patient began to have oxygen desaturation, which was reversed, then had       multiple episodes of regurgitation, proceedure aborted. Impression:           - Z-line variable.                       - Medium-sized hiatal hernia.                       -  No specimens collected. Recommendation:       - Continue present  medications.                       - possible upper GI series versus repeat egd under                        intubated proceedure. Procedure Code(s):    --- Professional ---                       [phone], 55, Esophagogastroduodenoscopy, flexible,                        transoral; diagnostic, including collection of                        specimen(s) by brushing or washing, when performed                        (separate procedure) Diagnosis Code(s):    --- Professional ---                       K22.8, Other specified diseases of esophagus                       K44.9, Diaphragmatic hernia without obstruction or                        gangrene                       K22.70, Barrett's esophagus without dysplasia CPT copyright 2016 American [employer]. All rights reserved. The codes documented in this report are preliminary and upon coder review may  be revised to meet current compliance requirements. Lollie Sails, MD 06/14/2016 11:20:37 AM This report has been signed electronically. Number of Addenda: 0 Note Initiated On: 06/14/2016 10:15 AM      [facility]

## 2016-06-14 NOTE — Anesthesia Preprocedure Evaluation (Signed)
Anesthesia Evaluation  Patient identified by MRN, date of birth, ID band Patient awake    Reviewed: Allergy & Precautions, NPO status , Patient's Chart, lab work & pertinent test results  History of Anesthesia Complications Negative for: history of anesthetic complications  Airway Mallampati: II       Dental   Pulmonary COPD,  COPD inhaler,           Cardiovascular hypertension, Pt. on medications + Peripheral Vascular Disease       Neuro/Psych Depression    GI/Hepatic Neg liver ROS, hiatal hernia, GERD  Medicated,  Endo/Other  diabetes, Type 2, Oral Hypoglycemic Agents  Renal/GU Renal InsufficiencyRenal disease     Musculoskeletal  (+) Arthritis , Osteoarthritis,    Abdominal   Peds  Hematology negative hematology ROS (+)   Anesthesia Other Findings   Reproductive/Obstetrics                             Anesthesia Physical Anesthesia Plan  ASA: III  Anesthesia Plan: General   Post-op Pain Management:    Induction: Intravenous  Airway Management Planned: Nasal Cannula  Additional Equipment:   Intra-op Plan:   Post-operative Plan:   Informed Consent: I have reviewed the patients History and Physical, chart, labs and discussed the procedure including the risks, benefits and alternatives for the proposed anesthesia with the patient or authorized representative who has indicated his/her understanding and acceptance.     Plan Discussed with:   Anesthesia Plan Comments:         Anesthesia Quick Evaluation

## 2016-06-14 NOTE — Anesthesia Postprocedure Evaluation (Signed)
Anesthesia Post Note  Patient: Myrtha P Batterton  Procedure(s) Performed: Procedure(s) (LRB): ESOPHAGOGASTRODUODENOSCOPY (EGD) WITH PROPOFOL (N/A) COLONOSCOPY WITH PROPOFOL (N/A)  Patient location during evaluation: Endoscopy Anesthesia Type: General Level of consciousness: awake and alert Pain management: pain level controlled Vital Signs Assessment: post-procedure vital signs reviewed and stable Respiratory status: spontaneous breathing and respiratory function stable Cardiovascular status: stable Anesthetic complications: no    Last Vitals:  Vitals:   06/14/16 1115 06/14/16 1125  BP: (!) 109/55 (!) 108/59  Pulse: 72 66  Resp: 17 14  Temp: (!) 35.6 C     Last Pain:  Vitals:   06/14/16 1115  TempSrc: Axillary                 KEPHART,WILLIAM K

## 2016-06-14 NOTE — Transfer of Care (Signed)
Immediate Anesthesia Transfer of Care Note  Patient: Sandra Hendrix  Procedure(s) Performed: Procedure(s): ESOPHAGOGASTRODUODENOSCOPY (EGD) WITH PROPOFOL (N/A) COLONOSCOPY WITH PROPOFOL (N/A)  Patient Location: PACU  Anesthesia Type:General  Level of Consciousness: awake, alert  and oriented  Airway & Oxygen Therapy: Patient Spontanous Breathing and Patient connected to nasal cannula oxygen  Post-op Assessment: Report given to RN and Post -op Vital signs reviewed and stable  Post vital signs: Reviewed and stable  Last Vitals:  Vitals:   06/14/16 0918 06/14/16 1115  BP: (!) 153/78   Pulse: 81   Resp: 19   Temp: (!) 35.9 C (!) 35.6 C    Last Pain:  Vitals:   06/14/16 1115  TempSrc: Axillary         Complications: No apparent anesthesia complications

## 2016-06-15 ENCOUNTER — Encounter: Payer: Self-pay | Admitting: Gastroenterology

## 2016-06-17 ENCOUNTER — Other Ambulatory Visit: Payer: Self-pay | Admitting: Internal Medicine

## 2016-07-05 ENCOUNTER — Other Ambulatory Visit: Payer: Self-pay | Admitting: Gastroenterology

## 2016-07-05 DIAGNOSIS — K2270 Barrett's esophagus without dysplasia: Secondary | ICD-10-CM

## 2016-07-12 ENCOUNTER — Ambulatory Visit
Admission: RE | Admit: 2016-07-12 | Discharge: 2016-07-12 | Disposition: A | Discharge status: Home / Self Care | Payer: Medicare Other | Source: Ambulatory Visit | Attending: Gastroenterology | Admitting: Gastroenterology

## 2016-07-12 DIAGNOSIS — K224 Dyskinesia of esophagus: Secondary | ICD-10-CM | POA: Diagnosis not present

## 2016-07-12 DIAGNOSIS — K5710 Diverticulosis of small intestine without perforation or abscess without bleeding: Secondary | ICD-10-CM | POA: Diagnosis not present

## 2016-07-12 DIAGNOSIS — K2270 Barrett's esophagus without dysplasia: Secondary | ICD-10-CM | POA: Diagnosis not present

## 2016-07-12 DIAGNOSIS — K219 Gastro-esophageal reflux disease without esophagitis: Secondary | ICD-10-CM | POA: Insufficient documentation

## 2016-07-12 DIAGNOSIS — K449 Diaphragmatic hernia without obstruction or gangrene: Secondary | ICD-10-CM | POA: Diagnosis not present

## 2016-07-19 ENCOUNTER — Other Ambulatory Visit: Payer: Self-pay | Admitting: Internal Medicine

## 2016-07-29 ENCOUNTER — Encounter: Payer: Self-pay | Admitting: Internal Medicine

## 2016-07-29 ENCOUNTER — Ambulatory Visit (INDEPENDENT_AMBULATORY_CARE_PROVIDER_SITE_OTHER): Payer: Medicare Other | Admitting: Internal Medicine

## 2016-07-29 VITALS — BP 130/88 | HR 70 | Temp 97.9°F | Resp 12 | Ht 61.0 in | Wt 175.8 lb

## 2016-07-29 DIAGNOSIS — E785 Hyperlipidemia, unspecified: Secondary | ICD-10-CM

## 2016-07-29 DIAGNOSIS — N183 Chronic kidney disease, stage 3 unspecified: Secondary | ICD-10-CM

## 2016-07-29 DIAGNOSIS — E1121 Type 2 diabetes mellitus with diabetic nephropathy: Secondary | ICD-10-CM

## 2016-07-29 DIAGNOSIS — E559 Vitamin D deficiency, unspecified: Secondary | ICD-10-CM

## 2016-07-29 DIAGNOSIS — I10 Essential (primary) hypertension: Secondary | ICD-10-CM

## 2016-07-29 DIAGNOSIS — R0683 Snoring: Secondary | ICD-10-CM | POA: Diagnosis not present

## 2016-07-29 LAB — COMPREHENSIVE METABOLIC PANEL
ALT: 15 U/L (ref 0–35)
AST: 19 U/L (ref 0–37)
Albumin: 4.5 g/dL (ref 3.5–5.2)
Alkaline Phosphatase: 103 U/L (ref 39–117)
BUN: 22 mg/dL (ref 6–23)
CO2: 31 mEq/L (ref 19–32)
Calcium: 9.6 mg/dL (ref 8.4–10.5)
Chloride: 101 mEq/L (ref 96–112)
Creatinine, Ser: 1.01 mg/dL (ref 0.40–1.20)
GFR: 57.22 mL/min — ABNORMAL LOW (ref >60.00)
Glucose, Bld: 123 mg/dL — ABNORMAL HIGH (ref 70–99)
Potassium: 4.1 mEq/L (ref 3.5–5.1)
Sodium: 141 mEq/L (ref 135–145)
Total Bilirubin: 0.5 mg/dL (ref 0.2–1.2)
Total Protein: 7.6 g/dL (ref 6.0–8.3)

## 2016-07-29 LAB — LIPID PANEL
Cholesterol: 194 mg/dL (ref 0–200)
HDL: 101.8 mg/dL (ref >39.00)
LDL Cholesterol: 76 mg/dL (ref 0–99)
NonHDL: 92.42
Total CHOL/HDL Ratio: 2
Triglycerides: 83 mg/dL (ref 0.0–149.0)
VLDL: 16.6 mg/dL (ref 0.0–40.0)

## 2016-07-29 LAB — HEMOGLOBIN A1C: Hgb A1c MFr Bld: 7.3 % — ABNORMAL HIGH (ref 4.6–6.5)

## 2016-07-29 LAB — VITAMIN D 25 HYDROXY (VIT D DEFICIENCY, FRACTURES): VITD: 60.16 ng/mL (ref 30.00–100.00)

## 2016-07-29 NOTE — Progress Notes (Signed)
Subjective:  Patient ID: Sandra Hendrix, female    DOB: 31-Jul-1946  Age: 70 y.o. MRN: [id number]  CC: The primary encounter diagnosis was Snoring. Diagnoses of Vitamin D deficiency, Type 2 diabetes mellitus with diabetic nephropathy, without long-term current use of insulin ([place]), Hyperlipidemia LDL goal <100, Essential hypertension, and CKD (chronic kidney disease) stage 3, GFR 30-59 ml/min were also pertinent to this visit.  [name] presents for follow up on Type 2 DM controlled   Lab Results  Component Value Date   HGBA1C 7.3 (H) 07/29/2016   Foot exam normal except for small callous over prior bunionectomy site  Flu shot up to date Needs eye exam Fasting    Checking sugars.  High was 210 on a day she missed a dose of meds.  Low was 75 symptomatic , occurred during the day when lunch was delayed.   Her  Insurance sent out a home Health RN did an A1c 6.0 from a fingerstick.   Rn commented on dose of metformin (Metformin XR 1500 mg daily).  Discussed with patient that her GFR is > 45 ml/mon and no dose adjustment is needed   She has a history of excessive snoring and was noted to have significant  oxygen desaturations  and regurgitation during recent EGD . hiatal hernia was noted  She also has history of morning  Headaches upon waking.  No prior sleep study Needs a Monday or Tuesday for the sleep study,   Outpatient Medications Prior to Visit  Medication Sig Dispense Refill  . ACCU-CHEK AVIVA PLUS test strip Test once daily 100 each 2  . albuterol (PROVENTIL HFA;VENTOLIN HFA) 108 (90 BASE) MCG/ACT inhaler Inhale 2 puffs into the lungs every 6 (six) hours as needed for wheezing.    Marland Kitchen amLODipine (NORVASC) 5 MG tablet Take 1 tablet by mouth  daily 90 tablet 2  . aspirin EC 81 MG tablet Take 81 mg by mouth daily.    Marland Kitchen atorvastatin (LIPITOR) 40 MG tablet TAKE 1 TABLET BY MOUTH  DAILY AT 6 PM. 60 tablet 2  . azelastine (ASTEPRO) 137 MCG/SPRAY nasal spray Place 1 spray  into the nose 2 (two) times daily. Use in each nostril as directed (Patient taking differently: Place 1 spray into the nose daily. Use in each nostril as directed) 30 mL 11  . BIOTIN PO Take by mouth. Take 1000mg  by mouth twice daily.    . Calcium Carbonate-Vitamin D (CALCIUM 600 + D PO) Take 1 tablet by mouth daily.    . Cholecalciferol (D3-1000 PO) Take 1,000 Int'l Units by mouth every morning.     . fish oil-omega-3 fatty acids 1000 MG capsule Take 1 capsule by mouth daily.    . fluticasone (FLONASE) 50 MCG/ACT nasal spray Place 1 spray into both nostrils daily. 48 g 1  . furosemide (LASIX) 20 MG tablet Take 1 tablet by mouth  every other day 45 tablet 2  . glipiZIDE (GLUCOTROL XL) 10 MG 24 hr tablet Take 1 tablet by mouth  daily 90 tablet 2  . JANUVIA 50 MG tablet TAKE 1 TABLET BY MOUTH  DAILY 90 tablet 1  . Lancet Devices (ACCU-CHEK SOFTCLIX) lancets Use to test blood sugars 1 -2 times daily 1 each 3  . Magnesium Oxide -Mg Supplement 250 MG TABS Take 250 mg by mouth daily.    . Meclizine HCl 25 MG CHEW Chew 25 mg by mouth daily as needed (for vertigo).     . metFORMIN (  GLUCOPHAGE-XR) 750 MG 24 hr tablet Take 2 tablets by mouth  daily with breakfast 180 tablet 2  . Multiple Vitamin (MULTIVITAMIN) tablet Take 1 tablet by mouth daily.    Marland Kitchen omeprazole (PRILOSEC) 20 MG capsule Take 1 capsule by mouth  daily 90 capsule 2  . Potassium 99 MG TABS Take 1 tablet by mouth daily.    Marland Kitchen senna-docusate (SENOKOT-S) 8.6-50 MG per tablet Take 1 tablet by mouth daily. 30 tablet 0  . sertraline (ZOLOFT) 50 MG tablet Take 1 tablet by mouth  daily 90 tablet 2  . verapamil (VERELAN PM) 180 MG 24 hr capsule Take 1 capsule by mouth  daily 90 capsule 2  . lisinopril (PRINIVIL,ZESTRIL) 20 MG tablet Take 20 mg by mouth daily.    . Black Cohosh (REMIFEMIN) 20 MG TABS Take 20 mg by mouth 2 (two) times daily.      No facility-administered medications prior to visit.     Review of Systems;  Patient denies , fevers,  malaise, unintentional weight loss, skin rash, eye pain, sinus congestion and sinus pain, sore throat, dysphagia,  hemoptysis , cough, dyspnea, wheezing, chest pain, palpitations, orthopnea, edema, abdominal pain, nausea, melena, diarrhea, constipation, flank pain, dysuria, hematuria, urinary  Frequency, nocturia, numbness, tingling, seizures,  Focal weakness, Loss of consciousness,  Tremor, insomnia, depression, anxiety, and suicidal ideation.      Objective:  BP 130/88   Pulse 70   Temp 97.9 F (36.6 C) (Oral)   Resp 12   Ht 5\' 1"  (1.549 m)   Wt 175 lb 12 oz (79.7 kg)   SpO2 95%   BMI 33.21 kg/m   BP Readings from Last 3 Encounters:  07/29/16 130/88  06/14/16 129/65  04/28/16 130/74    Wt Readings from Last 3 Encounters:  07/29/16 175 lb 12 oz (79.7 kg)  06/14/16 180 lb (81.6 kg)  04/28/16 178 lb 4 oz (80.9 kg)    General appearance: alert, cooperative and appears stated age Ears: normal TM's and external ear canals both ears Throat: lips, mucosa, and tongue normal; teeth and gums normal Neck: no adenopathy, no carotid bruit, supple, symmetrical, trachea midline and thyroid not enlarged, symmetric, no tenderness/mass/nodules Back: symmetric, no curvature. ROM normal. No CVA tenderness. Lungs: clear to auscultation bilaterally Heart: regular rate and rhythm, S1, S2 normal, no murmur, click, rub or gallop Abdomen: soft, non-tender; bowel sounds normal; no masses,  no organomegaly Pulses: 2+ and symmetric Skin: Skin color, texture, turgor normal. No rashes or lesions Lymph nodes: Cervical, supraclavicular, and axillary nodes normal.  Lab Results  Component Value Date   HGBA1C 7.3 (H) 07/29/2016   HGBA1C 7.2 (H) 04/28/2016   HGBA1C 7.2 (H) 12/28/2015    Lab Results  Component Value Date   CREATININE 1.01 07/29/2016   CREATININE 0.86 04/28/2016   CREATININE 1.01 12/28/2015    Lab Results  Component Value Date   WBC 5.0 02/24/2015   HGB 11.3 (L) 02/24/2015   HCT  34.6 (L) 02/24/2015   PLT 281 02/24/2015   GLUCOSE 123 (H) 07/29/2016   CHOL 194 07/29/2016   TRIG 83.0 07/29/2016   HDL 101.80 07/29/2016   LDLDIRECT 74.0 04/28/2016   LDLCALC 76 07/29/2016   ALT 15 07/29/2016   AST 19 07/29/2016   NA 141 07/29/2016   K 4.1 07/29/2016   CL 101 07/29/2016   CREATININE 1.01 07/29/2016   BUN 22 07/29/2016   CO2 31 07/29/2016   TSH 2.69 11/07/2014   INR 1.1 01/23/2009  HGBA1C 7.3 (H) 07/29/2016   MICROALBUR 2.4 (H) 04/28/2016    Dg Ugi W/o Kub  Result Date: 07/12/2016 CLINICAL DATA:  Chronic reflux and history of Barrett's esophagus. No hiatal hernia. EXAM: UPPER GI SERIES WITH KUB TECHNIQUE: After obtaining a scout radiograph a routine upper GI series was performed using thin density barium. FLUOROSCOPY TIME:  Fluoroscopy Time:  1 minutes 18 seconds Radiation Exposure Index (if provided by the fluoroscopic device): 70.50 mGy Number of Acquired Spot Images: 22 COMPARISON:  None. FINDINGS: Preliminary abdomen image reveals a normal gas pattern. Esophagus, stomach, and duodenum are visualized. The patient swallows readily. There is mild esophageal dysmotility with occasional tertiary contractions. There is a moderate fixed hiatal hernia with reflux of a full, barium to the upper esophagus which is slowed empty. There is no esophageal stricture, mass, or ulceration. There is fold thickening in the hiatal hernia consistent with a degree of proximal gastritis. More distally, the folds of the stomach appear unremarkable. There is no gastric mass or ulceration. There is a 1.5 cm in size diverticulum arising from the mid third portion of the duodenum. There is no duodenal mass or ulceration. The folds of the duodenum appear normal. IMPRESSION: Moderate hiatal hernia with diffuse reflux to the upper esophagus which is slowed empty. No esophageal stricture, mass, or ulceration. Mild esophageal dysmotility. Fold thickening at within hiatal hernia consistent with proximal  gastritis. Remainder of stomach appears unremarkable. Diverticulum arising from the third portion of the duodenum. Duodenum otherwise appears normal. No duodenal mass or ulceration. Electronically Signed   By: Lowella Grip III M.D.   On: 07/12/2016 10:09    Assessment & Plan:   Problem List Items Addressed This Visit    DM (diabetes mellitus), type 2 with renal complications ([place]) (Chronic)    Reasonably  well-controlled on current medications.   . Patient is up-to-date on eye exams and foot exam is normal today. Patient has mild proteinuria, and stage 3 CKD with GFR 33ml/min.  No dose adjustment is needed for etfromin unless GFR falls to < 45 ml/min.  Patient is tolerating statin therapy for CAD risk reduction and on ACE/ARB for reduction in proteinuria.  Continue current medications.   Lab Results  Component Value Date   HGBA1C 7.3 (H) 07/29/2016   Lab Results  Component Value Date   MICROALBUR 2.4 (H) 04/28/2016          Relevant Orders   Hemoglobin A1c (Completed)   Comprehensive metabolic panel (Completed)   Essential hypertension    Well controlled on current regimen. Renal function normal, no changes today.  Lab Results  Component Value Date   NA 141 07/29/2016   K 4.1 07/29/2016   CL 101 07/29/2016   CO2 31 07/29/2016   Lab Results  Component Value Date   CREATININE 1.01 07/29/2016         Hyperlipidemia LDL goal <100    LDL and triglycerides are at goal on atorvastatin. SHe has no side effects and liver enzymes are normal. No changes today    Lab Results  Component Value Date   CHOL 194 07/29/2016   HDL 101.80 07/29/2016   LDLCALC 76 07/29/2016   LDLDIRECT 74.0 04/28/2016   TRIG 83.0 07/29/2016   CHOLHDL 2 07/29/2016   Lab Results  Component Value Date   ALT 15 07/29/2016   AST 19 07/29/2016   ALKPHOS 103 07/29/2016   BILITOT 0.5 07/29/2016           Relevant Orders  Lipid panel (Completed)   CKD (chronic kidney disease) stage 3, GFR  30-59 ml/min    Monitored semi annually by CCK.  GFR has been stable at 60 ml/min  Workup was notable only for a congenitally small right kidney   By U/S.  Lab Results  Component Value Date   MICROALBUR 2.4 (H) 04/28/2016   Lab Results  Component Value Date   CREATININE 1.01 07/29/2016         Snoring - Primary    Complicated by morning headaches  witnessed desaturations during recent EGD.  Sleep study advised to rule out OSA      Relevant Orders   Nocturnal polysomnography (NPSG)    Other Visit Diagnoses    Vitamin D deficiency       Relevant Orders   VITAMIN D 25 Hydroxy (Vit-D Deficiency, Fractures) (Completed)      I have discontinued Ms. Schorr's Black Cohosh. I am also having her maintain her Cholecalciferol (D3-1000 PO), BIOTIN PO, multivitamin, Calcium Carbonate-Vitamin D (CALCIUM 600 + D PO), Potassium, fish oil-omega-3 fatty acids, Meclizine HCl, albuterol, azelastine, accu-chek softclix, aspirin EC, Magnesium Oxide -Mg Supplement, senna-docusate, atorvastatin, amLODipine, verapamil, omeprazole, sertraline, metFORMIN, fluticasone, ACCU-CHEK AVIVA PLUS, furosemide, lisinopril, glipiZIDE, and JANUVIA.  No orders of the defined types were placed in this encounter.   Medications Discontinued During This Encounter  Medication Reason  . Black Cohosh (REMIFEMIN) 20 MG TABS Error    Follow-up: No Follow-up on file.   Crecencio Mc, MD

## 2016-07-29 NOTE — Patient Instructions (Signed)
Your low oxygen levels during your endoscopy procedure may be a sign of undiagnosed sleep apnea   This diagnosis can be made or ruled out with a  sleep study.    Your referral is in process as discussed. Our referral coordinator will call you when the appointment has been made.  If you do not hear from [facility] in our office in a week,  Please call us back

## 2016-07-29 NOTE — Progress Notes (Signed)
Pre-visit discussion using our clinic review tool. No additional management support is needed unless otherwise documented below in the visit note.  

## 2016-07-31 ENCOUNTER — Encounter: Payer: Self-pay | Admitting: Internal Medicine

## 2016-07-31 DIAGNOSIS — R0683 Snoring: Secondary | ICD-10-CM | POA: Insufficient documentation

## 2016-07-31 NOTE — Assessment & Plan Note (Signed)
Monitored semi annually by CCK.  GFR has been stable at 60 ml/min  Workup was notable only for a congenitally small right kidney   By U/S.  Lab Results  Component Value Date   MICROALBUR 2.4 (H) 04/28/2016   Lab Results  Component Value Date   CREATININE 1.01 07/29/2016

## 2016-07-31 NOTE — Assessment & Plan Note (Addendum)
Reasonably  well-controlled on current medications.   . Patient is up-to-date on eye exams and foot exam is normal today. Patient has mild proteinuria, and stage 3 CKD with GFR 79ml/min.  No dose adjustment is needed for etfromin unless GFR falls to < 45 ml/min.  Patient is tolerating statin therapy for CAD risk reduction and on ACE/ARB for reduction in proteinuria.  Continue current medications.   Lab Results  Component Value Date   HGBA1C 7.3 (H) 07/29/2016   Lab Results  Component Value Date   MICROALBUR 2.4 (H) 04/28/2016

## 2016-07-31 NOTE — Assessment & Plan Note (Signed)
LDL and triglycerides are at goal on atorvastatin. SHe has no side effects and liver enzymes are normal. No changes today    Lab Results  Component Value Date   CHOL 194 07/29/2016   HDL 101.80 07/29/2016   LDLCALC 76 07/29/2016   LDLDIRECT 74.0 04/28/2016   TRIG 83.0 07/29/2016   CHOLHDL 2 07/29/2016   Lab Results  Component Value Date   ALT 15 07/29/2016   AST 19 07/29/2016   ALKPHOS 103 07/29/2016   BILITOT 0.5 07/29/2016

## 2016-07-31 NOTE — Assessment & Plan Note (Signed)
Complicated by morning headaches  witnessed desaturations during recent EGD.  Sleep study advised to rule out OSA

## 2016-07-31 NOTE — Assessment & Plan Note (Signed)
Well controlled on current regimen. Renal function normal, no changes today.  Lab Results  Component Value Date   NA 141 07/29/2016   K 4.1 07/29/2016   CL 101 07/29/2016   CO2 31 07/29/2016   Lab Results  Component Value Date   CREATININE 1.01 07/29/2016

## 2016-08-10 ENCOUNTER — Other Ambulatory Visit: Payer: Self-pay | Admitting: Internal Medicine

## 2016-08-24 ENCOUNTER — Ambulatory Visit: Payer: Medicare Other | Admitting: Family

## 2016-08-30 LAB — HM DIABETES EYE EXAM

## 2016-09-27 ENCOUNTER — Other Ambulatory Visit: Payer: Self-pay | Admitting: Gastroenterology

## 2016-09-27 DIAGNOSIS — R1031 Right lower quadrant pain: Secondary | ICD-10-CM

## 2016-09-27 DIAGNOSIS — R1032 Left lower quadrant pain: Secondary | ICD-10-CM

## 2016-09-30 ENCOUNTER — Ambulatory Visit: Payer: Medicare Other

## 2016-10-04 ENCOUNTER — Ambulatory Visit: Payer: Medicare Other | Attending: Otolaryngology

## 2016-10-04 DIAGNOSIS — E669 Obesity, unspecified: Secondary | ICD-10-CM | POA: Insufficient documentation

## 2016-10-04 DIAGNOSIS — G4733 Obstructive sleep apnea (adult) (pediatric): Secondary | ICD-10-CM | POA: Insufficient documentation

## 2016-10-05 ENCOUNTER — Encounter: Payer: Self-pay | Admitting: Family

## 2016-10-05 ENCOUNTER — Ambulatory Visit (INDEPENDENT_AMBULATORY_CARE_PROVIDER_SITE_OTHER): Payer: Medicare Other | Admitting: Family

## 2016-10-05 VITALS — BP 130/68 | HR 79 | Temp 97.6°F | Ht 61.0 in | Wt 175.6 lb

## 2016-10-05 DIAGNOSIS — R319 Hematuria, unspecified: Secondary | ICD-10-CM

## 2016-10-05 LAB — POCT URINALYSIS DIPSTICK
Bilirubin, UA: NEGATIVE
Blood, UA: NEGATIVE
Glucose, UA: NEGATIVE
Ketones, UA: NEGATIVE
Nitrite, UA: NEGATIVE
Protein, UA: NEGATIVE
Spec Grav, UA: 1.01
Urobilinogen, UA: 0.2
pH, UA: 6

## 2016-10-05 LAB — URINALYSIS, ROUTINE W REFLEX MICROSCOPIC
Bilirubin Urine: NEGATIVE
Hgb urine dipstick: NEGATIVE
Ketones, ur: NEGATIVE
Leukocytes, UA: NEGATIVE
Nitrite: NEGATIVE
Specific Gravity, Urine: 1.01 (ref 1.000–1.030)
Total Protein, Urine: NEGATIVE
Urine Glucose: NEGATIVE
Urobilinogen, UA: 0.2 (ref 0.0–1.0)
pH: 6 (ref 5.0–8.0)

## 2016-10-05 NOTE — Progress Notes (Signed)
Pre visit review using our clinic review tool, if applicable. No additional management support is needed unless otherwise documented below in the visit note. 

## 2016-10-05 NOTE — Progress Notes (Signed)
Subjective:    Patient ID: Sandra Hendrix, female    DOB: February 29, 1946, 71 y.o.   MRN: [id number]  CC: Tovia Dora Sharpe is a 71 y.o. female who presents today for an acute visit.    HPI: CC : Had 'pink tinged'  urine for one day, 'spotty' 2 days ago. Resolved today. No blood clots seen in urine, dysuria, frequency, flank pain, abdominal pain, or fever. Hasn't any beets, no changes to medication. No Azo .  CKD follows with nephrology Dr. Zollie Scale.   Note, follows with Dr Angelica Chessman of GI. Due to abdominal cramping past couple of months ago, pending Abdominal US.    No h/o kidney stones      HISTORY:  Past Medical History:  Diagnosis Date  . Allergy   . Arthritis   . Depression   . Diabetes mellitus without complication ([place])   . GERD (gastroesophageal reflux disease)   . Hyperlipidemia   . Hypertension   . Urinary incontinence   . Varicose veins    Past Surgical History:  Procedure Laterality Date  . CHOLECYSTECTOMY    . COLONOSCOPY WITH PROPOFOL N/A 06/14/2016   Procedure: COLONOSCOPY WITH PROPOFOL;  Surgeon: Lollie Sails, MD;  Location: [facility] ENDOSCOPY;  Service: Endoscopy;  Laterality: N/A;  . ESOPHAGOGASTRODUODENOSCOPY (EGD) WITH PROPOFOL N/A 06/14/2016   Procedure: ESOPHAGOGASTRODUODENOSCOPY (EGD) WITH PROPOFOL;  Surgeon: Lollie Sails, MD;  Location: [facility] ENDOSCOPY;  Service: Endoscopy;  Laterality: N/A;  . JOINT REPLACEMENT Right 2007  . JOINT REPLACEMENT Left 2004  . ROTATOR CUFF REPAIR Right   . UPPER GI ENDOSCOPY     x2   Family History  Problem Relation Age of Onset  . Stroke Mother   . Hypertension Mother   . Lupus Mother   . Alcohol abuse Father   . Diabetes Father   . Cancer Father     Liver Cancer  . Arthritis Sister   . Diabetes Sister   . Hyperlipidemia Daughter   . Hypertension Daughter   . Mental retardation Daughter   . Cancer Maternal Aunt     ovarian ca  . Diabetes Paternal Aunt   . Drug abuse Paternal Aunt   . Cancer Maternal  Grandfather     colon ca    Allergies: Clindamycin/lincomycin; Codeine; Morphine and related; Talwin [pentazocine]; Oxycodone-acetaminophen; and Cleocin [clindamycin hcl] Current Outpatient Prescriptions on File Prior to Visit  Medication Sig Dispense Refill  . ACCU-CHEK AVIVA PLUS test strip Test once daily 100 each 2  . albuterol (PROVENTIL HFA;VENTOLIN HFA) 108 (90 BASE) MCG/ACT inhaler Inhale 2 puffs into the lungs every 6 (six) hours as needed for wheezing.    Marland Kitchen amLODipine (NORVASC) 5 MG tablet Take 1 tablet by mouth  daily 90 tablet 2  . aspirin EC 81 MG tablet Take 81 mg by mouth daily.    Marland Kitchen atorvastatin (LIPITOR) 40 MG tablet TAKE 1 TABLET BY MOUTH  DAILY AT 6 PM. 60 tablet 2  . azelastine (ASTEPRO) 137 MCG/SPRAY nasal spray Place 1 spray into the nose 2 (two) times daily. Use in each nostril as directed (Patient taking differently: Place 1 spray into the nose daily. Use in each nostril as directed) 30 mL 11  . BIOTIN PO Take by mouth. Take 1000mg  by mouth twice daily.    . Calcium Carbonate-Vitamin D (CALCIUM 600 + D PO) Take 1 tablet by mouth daily.    . Cholecalciferol (D3-1000 PO) Take 1,000 Int'l Units by mouth every morning.     Marland Kitchen  fish oil-omega-3 fatty acids 1000 MG capsule Take 1 capsule by mouth daily.    . fluticasone (FLONASE) 50 MCG/ACT nasal spray Place 1 spray into both nostrils daily. 48 g 1  . furosemide (LASIX) 20 MG tablet Take 1 tablet by mouth  every other day 45 tablet 2  . glipiZIDE (GLUCOTROL XL) 10 MG 24 hr tablet Take 1 tablet by mouth  daily 90 tablet 2  . Lancet Devices (ACCU-CHEK SOFTCLIX) lancets Use to test blood sugars 1 -2 times daily 1 each 3  . lisinopril (PRINIVIL,ZESTRIL) 20 MG tablet Take 20 mg by mouth daily.    Marland Kitchen lisinopril (PRINIVIL,ZESTRIL) 20 MG tablet TAKE 1 TABLET BY MOUTH  DAILY 90 tablet 3  . Magnesium Oxide -Mg Supplement 250 MG TABS Take 250 mg by mouth daily.    . Meclizine HCl 25 MG CHEW Chew 25 mg by mouth daily as needed (for  vertigo).     . metFORMIN (GLUCOPHAGE-XR) 750 MG 24 hr tablet Take 2 tablets by mouth  daily with breakfast 180 tablet 2  . Multiple Vitamin (MULTIVITAMIN) tablet Take 1 tablet by mouth daily.    Marland Kitchen omeprazole (PRILOSEC) 20 MG capsule Take 1 capsule by mouth  daily 90 capsule 2  . Potassium 99 MG TABS Take 1 tablet by mouth daily.    Marland Kitchen senna-docusate (SENOKOT-S) 8.6-50 MG per tablet Take 1 tablet by mouth daily. 30 tablet 0  . sertraline (ZOLOFT) 50 MG tablet Take 1 tablet by mouth  daily 90 tablet 2  . verapamil (VERELAN PM) 180 MG 24 hr capsule Take 1 capsule by mouth  daily 90 capsule 2   No current facility-administered medications on file prior to visit.     Social History  Substance Use Topics  . Smoking status: Never Smoker  . Smokeless tobacco: Never Used  . Alcohol use No    Review of Systems  Constitutional: Negative for chills and fever.  Respiratory: Negative for cough.   Cardiovascular: Negative for chest pain and palpitations.  Gastrointestinal: Negative for abdominal distention, anal bleeding, blood in stool, nausea and vomiting.  Genitourinary: Positive for hematuria. Negative for decreased urine volume, dysuria, flank pain and frequency.      Objective:    BP 130/68   Pulse 79   Temp 97.6 F (36.4 C) (Oral)   Ht 5\' 1"  (1.549 m)   Wt 175 lb 9.6 oz (79.7 kg)   SpO2 96%   BMI 33.18 kg/m    Physical Exam  Constitutional: She appears well-developed and well-nourished.  Eyes: Conjunctivae are normal.  Cardiovascular: Normal rate, regular rhythm, normal heart sounds and normal pulses.   Pulmonary/Chest: Effort normal and breath sounds normal. She has no wheezes. She has no rhonchi. She has no rales.  Abdominal: Soft. Normal appearance and bowel sounds are normal. She exhibits no distension, no fluid wave, no ascites and no mass. There is no tenderness. There is no rigidity, no rebound, no guarding and no CVA tenderness.  Neurological: She is alert.  Skin: Skin  is warm and dry.  Psychiatric: She has a normal mood and affect. Her speech is normal and behavior is normal. Thought content normal.  Vitals reviewed.      Assessment & Plan:   1. Hematuria, unspecified type No blood seen in urine dipstick in office. Pending across the, urine culture. Reassured by benign abdominal exam, absence of fever, CVA tenderness. Etiology unclear at this point. Advised patient to remain vigilant and will wait on pending urine  tests. If  negative for blood, advised patient again let me know immediately if color of her urine changes again.  - POCT Urinalysis Dipstick - Urine culture - Urinalysis, Routine w reflex microscopic    I have discontinued Ms. Wittke's JANUVIA. I am also having her maintain her Cholecalciferol (D3-1000 PO), BIOTIN PO, multivitamin, Calcium Carbonate-Vitamin D (CALCIUM 600 + D PO), Potassium, fish oil-omega-3 fatty acids, Meclizine HCl, albuterol, azelastine, accu-chek softclix, aspirin EC, Magnesium Oxide -Mg Supplement, senna-docusate, atorvastatin, amLODipine, verapamil, omeprazole, sertraline, metFORMIN, fluticasone, ACCU-CHEK AVIVA PLUS, furosemide, lisinopril, glipiZIDE, lisinopril, and sitaGLIPtin.   Meds ordered this encounter  Medications  . sitaGLIPtin (JANUVIA) 50 MG tablet    Sig: once daily.    Return precautions given.   Risks, benefits, and alternatives of the medications and treatment plan prescribed today were discussed, and patient expressed understanding.   Education regarding symptom management and diagnosis given to patient on AVS.  Continue to follow with TULLO, Aris Everts, MD for routine health maintenance.   [place] and I agreed with plan.   Mable Paris, FNP

## 2016-10-05 NOTE — Patient Instructions (Signed)
Etiology unclear at this point of urine. remain vigilant and will wait on pending urine tests. If  negative for blood, we will continue to closely observe.   let me know immediately if color of your urine changes again.  Pleasure meeting you

## 2016-10-07 LAB — URINE CULTURE: Organism ID, Bacteria: NO GROWTH

## 2016-10-11 ENCOUNTER — Encounter: Payer: Self-pay | Admitting: Internal Medicine

## 2016-10-11 NOTE — Telephone Encounter (Signed)
I have scheduled patient for 4 O'clock Monday with you but looks like GYN is follow ing patient she has a vaginal [place] scheduled for 10/17/16 at 10.15 ordered by Stephens November NP , I have not called Patient with appointment yet wanted to speak with you first.

## 2016-10-13 LAB — HM DIABETES EYE EXAM

## 2016-10-14 ENCOUNTER — Encounter: Payer: Self-pay | Admitting: Internal Medicine

## 2016-10-17 ENCOUNTER — Ambulatory Visit
Admission: RE | Admit: 2016-10-17 | Discharge: 2016-10-17 | Disposition: A | Discharge status: Home / Self Care | Payer: Medicare Other | Source: Ambulatory Visit | Attending: Gastroenterology | Admitting: Gastroenterology

## 2016-10-17 ENCOUNTER — Ambulatory Visit (INDEPENDENT_AMBULATORY_CARE_PROVIDER_SITE_OTHER): Payer: Medicare Other | Admitting: Internal Medicine

## 2016-10-17 ENCOUNTER — Other Ambulatory Visit (HOSPITAL_COMMUNITY)
Admission: RE | Admit: 2016-10-17 | Discharge: 2016-10-17 | Disposition: A | Discharge status: Home / Self Care | Payer: Medicare Other | Source: Ambulatory Visit | Attending: Internal Medicine | Admitting: Internal Medicine

## 2016-10-17 ENCOUNTER — Encounter: Payer: Self-pay | Admitting: Internal Medicine

## 2016-10-17 VITALS — BP 130/82 | HR 76 | Temp 98.1°F | Resp 16 | Ht 61.0 in | Wt 178.1 lb

## 2016-10-17 DIAGNOSIS — G4733 Obstructive sleep apnea (adult) (pediatric): Secondary | ICD-10-CM | POA: Diagnosis not present

## 2016-10-17 DIAGNOSIS — I872 Venous insufficiency (chronic) (peripheral): Secondary | ICD-10-CM | POA: Diagnosis not present

## 2016-10-17 DIAGNOSIS — N950 Postmenopausal bleeding: Secondary | ICD-10-CM | POA: Diagnosis not present

## 2016-10-17 DIAGNOSIS — N859 Noninflammatory disorder of uterus, unspecified: Secondary | ICD-10-CM | POA: Diagnosis not present

## 2016-10-17 DIAGNOSIS — R1032 Left lower quadrant pain: Secondary | ICD-10-CM | POA: Diagnosis present

## 2016-10-17 DIAGNOSIS — R0683 Snoring: Secondary | ICD-10-CM

## 2016-10-17 DIAGNOSIS — R1031 Right lower quadrant pain: Secondary | ICD-10-CM | POA: Diagnosis present

## 2016-10-17 DIAGNOSIS — E1121 Type 2 diabetes mellitus with diabetic nephropathy: Secondary | ICD-10-CM | POA: Diagnosis not present

## 2016-10-17 DIAGNOSIS — Z01411 Encounter for gynecological examination (general) (routine) with abnormal findings: Secondary | ICD-10-CM | POA: Diagnosis present

## 2016-10-17 DIAGNOSIS — N83202 Unspecified ovarian cyst, left side: Secondary | ICD-10-CM | POA: Diagnosis not present

## 2016-10-17 DIAGNOSIS — I10 Essential (primary) hypertension: Secondary | ICD-10-CM

## 2016-10-17 MED ORDER — LOSARTAN POTASSIUM-HCTZ 50-12.5 MG PO TABS: 1.0000 | ORAL_TABLET | Freq: Every day | ORAL | 0 refills | Status: DC

## 2016-10-17 NOTE — Assessment & Plan Note (Addendum)
PAP smear done.  Ultrasound noted large fibroid,  Could not assess endometrial stripe   Referral to Dr Enzo Bi

## 2016-10-17 NOTE — Patient Instructions (Addendum)
For the swelling:  Stop the amlodipine   Start the losartan hctz , on table daily in th [place] to use the furosemide for 3  more days  To get rid of fluid   If bp is no 120/70 or less after a week on the new regimen,  You can double the dose (2 pills in the morning )    For the sleep apnea:  I have ordered an autotitrating mask for you.  The office will call you with the home health assignment  That will provide you the equipment You can use alprazolam to help you adjust to the mask.  Just call the office once you receive the equipment   Referral to Dr Zipporah Plants is in progress

## 2016-10-17 NOTE — Progress Notes (Signed)
Subjective:  Patient ID: Sandra Hendrix, female    DOB: 13-Feb-1946  Age: 71 y.o. MRN: [id number]  CC: The primary encounter diagnosis was Postmenopause bleeding. Diagnoses of Post-menopausal bleeding, OSA (obstructive sleep apnea), Chronic venous insufficiency, Type 2 diabetes mellitus with diabetic nephropathy, without long-term current use of insulin ([place]), Essential hypertension, and Snoring were also pertinent to this visit.  [name] presents for follow up on hypertension, new diagnosis of sleep apnea,  and  evaluation of vaginal bleeding that started two weeks ago .  Last bloody show was ysterday,  Reports a volume of blood similar to a light menstrual period,  Blood was intially old and brown but yesterday's was red. Some cramping occurring on and off  in the right lower quadrant .  No recent sexual intercourse.  Does not take HRT .  no change in bowel movements,  No history of kidney stones   Had pelvic ultrasound this morning, felr tender  in both lower quadrantsts with ultrasound procedure.    Recent Sleep study and CPAP titration study reviewed.  Severe sleep apnea .  Denies daytime sleepiness .  Snores terribly per husband.  Has noticed increased lower extremity edema since starting amlodipine.  Not taking lisinopril for unclear reasons.   Does not recall any adverse effect.     Outpatient Medications Prior to Visit  Medication Sig Dispense Refill  . ACCU-CHEK AVIVA PLUS test strip Test once daily 100 each 2  . albuterol (PROVENTIL HFA;VENTOLIN HFA) 108 (90 BASE) MCG/ACT inhaler Inhale 2 puffs into the lungs every 6 (six) hours as needed for wheezing.    Marland Kitchen amLODipine (NORVASC) 5 MG tablet Take 1 tablet by mouth  daily 90 tablet 2  . aspirin EC 81 MG tablet Take 81 mg by mouth daily.    Marland Kitchen atorvastatin (LIPITOR) 40 MG tablet TAKE 1 TABLET BY MOUTH  DAILY AT 6 PM. 60 tablet 2  . azelastine (ASTEPRO) 137 MCG/SPRAY nasal spray Place 1 spray into the nose 2 (two) times  daily. Use in each nostril as directed (Patient taking differently: Place 1 spray into the nose daily. Use in each nostril as directed) 30 mL 11  . BIOTIN PO Take by mouth. Take 1000mg  by mouth twice daily.    . Calcium Carbonate-Vitamin D (CALCIUM 600 + D PO) Take 1 tablet by mouth daily.    . Cholecalciferol (D3-1000 PO) Take 1,000 Int'l Units by mouth every morning.     . fish oil-omega-3 fatty acids 1000 MG capsule Take 1 capsule by mouth daily.    . fluticasone (FLONASE) 50 MCG/ACT nasal spray Place 1 spray into both nostrils daily. 48 g 1  . furosemide (LASIX) 20 MG tablet Take 1 tablet by mouth  every other day 45 tablet 2  . glipiZIDE (GLUCOTROL XL) 10 MG 24 hr tablet Take 1 tablet by mouth  daily 90 tablet 2  . Lancet Devices (ACCU-CHEK SOFTCLIX) lancets Use to test blood sugars 1 -2 times daily 1 each 3  . lisinopril (PRINIVIL,ZESTRIL) 20 MG tablet Take 20 mg by mouth daily.    Marland Kitchen lisinopril (PRINIVIL,ZESTRIL) 20 MG tablet TAKE 1 TABLET BY MOUTH  DAILY 90 tablet 3  . Magnesium Oxide -Mg Supplement 250 MG TABS Take 250 mg by mouth daily.    . Meclizine HCl 25 MG CHEW Chew 25 mg by mouth daily as needed (for vertigo).     . metFORMIN (GLUCOPHAGE-XR) 750 MG 24 hr tablet Take 2 tablets by  mouth  daily with breakfast 180 tablet 2  . Multiple Vitamin (MULTIVITAMIN) tablet Take 1 tablet by mouth daily.    Marland Kitchen omeprazole (PRILOSEC) 20 MG capsule Take 1 capsule by mouth  daily 90 capsule 2  . Potassium 99 MG TABS Take 1 tablet by mouth daily.    Marland Kitchen senna-docusate (SENOKOT-S) 8.6-50 MG per tablet Take 1 tablet by mouth daily. 30 tablet 0  . sertraline (ZOLOFT) 50 MG tablet Take 1 tablet by mouth  daily 90 tablet 2  . sitaGLIPtin (JANUVIA) 50 MG tablet once daily.    . verapamil (VERELAN PM) 180 MG 24 hr capsule Take 1 capsule by mouth  daily 90 capsule 2   No facility-administered medications prior to visit.     Review of Systems;  Patient denies headache, fevers, malaise, unintentional weight  loss, skin rash, eye pain, sinus congestion and sinus pain, sore throat, dysphagia,  hemoptysis , cough, dyspnea, wheezing, chest pain, palpitations, orthopnea,  nausea, melena, diarrhea, constipation, flank pain, dysuria, hematuria, urinary  Frequency, nocturia, numbness, tingling, seizures,  Focal weakness, Loss of consciousness,  Tremor, insomnia, depression, anxiety, and suicidal ideation.      Objective:  BP 130/82   Pulse 76   Temp 98.1 F (36.7 C) (Oral)   Resp 16   Ht 5\' 1"  (1.549 m)   Wt 178 lb 2 oz (80.8 kg)   SpO2 96%   BMI 33.66 kg/m   BP Readings from Last 3 Encounters:  10/17/16 130/82  10/05/16 130/68  07/29/16 130/88    Wt Readings from Last 3 Encounters:  10/17/16 178 lb 2 oz (80.8 kg)  10/05/16 175 lb 9.6 oz (79.7 kg)  07/29/16 175 lb 12 oz (79.7 kg)    General Appearance:    Alert, cooperative, no distress, appears stated age  Head:    Normocephalic, without obvious abnormality, atraumatic  Back:     Symmetric, no curvature, ROM normal, no CVA tenderness  Lungs:     Clear to auscultation bilaterally, respirations unlabored  Chest Wall:    No tenderness or deformity   Heart:    Regular rate and rhythm, S1 and S2 normal, no murmur, rub   or gallop  Abdomen:     Soft, non-tender, bowel sounds active all four quadrants,    no masses, no organomegaly  Genitalia:    Pelvic: cervix plump, blotchy  in appearance, external genitalia atrophic, mild left no adnexal  Tenderness without guarding, no cervical motion tenderness, rectovaginal septum normal, uterus enlarged and vagina normal without discharge  Extremities:   Extremities normal, atraumatic, 1+ pitting edema  Pulses:   2+ and symmetric all extremities  Skin:   Skin color, texture, turgor normal, no rashes or lesions  Lymph nodes:   Cervical, supraclavicular, and axillary nodes normal  Neurologic:   CNII-XII intact, normal strength, sensation and reflexes    throughout    Lab Results  Component Value  Date   HGBA1C 7.3 (H) 07/29/2016   HGBA1C 7.2 (H) 04/28/2016   HGBA1C 7.2 (H) 12/28/2015    Lab Results  Component Value Date   CREATININE 1.01 07/29/2016   CREATININE 0.86 04/28/2016   CREATININE 1.01 12/28/2015    Lab Results  Component Value Date   WBC 5.0 02/24/2015   HGB 11.3 (L) 02/24/2015   HCT 34.6 (L) 02/24/2015   PLT 281 02/24/2015   GLUCOSE 123 (H) 07/29/2016   CHOL 194 07/29/2016   TRIG 83.0 07/29/2016   HDL 101.80 07/29/2016   LDLDIRECT  74.0 04/28/2016   LDLCALC 76 07/29/2016   ALT 15 07/29/2016   AST 19 07/29/2016   NA 141 07/29/2016   K 4.1 07/29/2016   CL 101 07/29/2016   CREATININE 1.01 07/29/2016   BUN 22 07/29/2016   CO2 31 07/29/2016   TSH 2.69 11/07/2014   INR 1.1 01/23/2009   HGBA1C 7.3 (H) 07/29/2016   MICROALBUR 2.4 (H) 04/28/2016    US Transvaginal Non-ob  Result Date: 10/17/2016 CLINICAL DATA:  Pain cramping. EXAM: TRANSABDOMINAL AND TRANSVAGINAL ULTRASOUND OF PELVIS TECHNIQUE: Both transabdominal and transvaginal ultrasound examinations of the pelvis were performed. Transabdominal technique was performed for global imaging of the pelvis including uterus, ovaries, adnexal regions, and pelvic cul-de-sac. It was necessary to proceed with endovaginal exam following the transabdominal exam to visualize the uterus and ovaries. COMPARISON:  CT 02/21/2015 . FINDINGS: Uterus Measurements: 7.8 x 4.8 x 6.2 cm. Large 5.8 x 4.3 x 5.3 cm mass is noted within the a uterine body. Echodensities consistent calcifications noted within the mass. Finding most consistent with a fibroid. Endometrium Thickness: Could not identified due to large uterine mass. Right ovary Measurements: 2.6 x 1.4 x 1.4 cm. Normal appearance/no adnexal mass. Left ovary Measurements: 4.6 x 2.4 x 4.4 cm. Normal appearance/no adnexal mass. 4.3 x 2.0 x 3.9 cm simple cyst left ovary. Similar finding noted on prior CT. Other findings No abnormal free fluid. IMPRESSION: Large 5.8 x 4.3 x 5.3 cm  calcified mass in uterus consistent large fibroid. 2. 4.3 x 2.0 x 3.9 cm simple cyst left ovary. Similar finding noted on prior CT. This is almost certainly benign, but follow up ultrasound is recommended in 1 year according to the Society of Radiologists in [place] Statement (D Clovis Riley et al. Management of Asymptomatic Ovarian and Other Adnexal Cysts Imaged at [place]: Society of Radiologists in [place] Statement 2010. Radiology 256 (Sept 2010): [id number].). Electronically Signed   By: [place]   On: 10/17/2016 11:40   US Pelvis Complete  Result Date: 10/17/2016 CLINICAL DATA:  Pain cramping. EXAM: TRANSABDOMINAL AND TRANSVAGINAL ULTRASOUND OF PELVIS TECHNIQUE: Both transabdominal and transvaginal ultrasound examinations of the pelvis were performed. Transabdominal technique was performed for global imaging of the pelvis including uterus, ovaries, adnexal regions, and pelvic cul-de-sac. It was necessary to proceed with endovaginal exam following the transabdominal exam to visualize the uterus and ovaries. COMPARISON:  CT 02/21/2015 . FINDINGS: Uterus Measurements: 7.8 x 4.8 x 6.2 cm. Large 5.8 x 4.3 x 5.3 cm mass is noted within the a uterine body. Echodensities consistent calcifications noted within the mass. Finding most consistent with a fibroid. Endometrium Thickness: Could not identified due to large uterine mass. Right ovary Measurements: 2.6 x 1.4 x 1.4 cm. Normal appearance/no adnexal mass. Left ovary Measurements: 4.6 x 2.4 x 4.4 cm. Normal appearance/no adnexal mass. 4.3 x 2.0 x 3.9 cm simple cyst left ovary. Similar finding noted on prior CT. Other findings No abnormal free fluid. IMPRESSION: Large 5.8 x 4.3 x 5.3 cm calcified mass in uterus consistent large fibroid. 2. 4.3 x 2.0 x 3.9 cm simple cyst left ovary. Similar finding noted on prior CT. This is almost certainly benign, but follow up ultrasound is recommended in 1 year according to the  Society of Radiologists in [place] Statement (D Clovis Riley et al. Management of Asymptomatic Ovarian and Other Adnexal Cysts Imaged at [place]: Society of Radiologists in [place] Statement 2010. Radiology 256 (Sept 2010): [id number].). Electronically Signed   By:  [place]   On: 10/17/2016 11:40    Assessment & Plan:   Problem List Items Addressed This Visit    Chronic venous insufficiency    With edema aggravated by OSA and use of amlodipine.  bp meds changed,  OSA addressed.  Advised to increase furosemide use  to daily x 3 days,  Then suspend.       Relevant Medications   losartan-hydrochlorothiazide (HYZAAR) 50-12.5 MG tablet   DM (diabetes mellitus), type 2 with renal complications ([facility]) (Chronic)    Reasonable control for age. no changes today except adding losartan (patient not taking lisinopril)       Relevant Medications   losartan-hydrochlorothiazide (HYZAAR) 50-12.5 MG tablet   Essential hypertension    Stopping amlodipine due to worsening edema,  Losartan/hct prescribed.       Relevant Medications   losartan-hydrochlorothiazide (HYZAAR) 50-12.5 MG tablet   Post-menopausal bleeding    PAP smear done.  Ultrasound noted large fibroid,  Could not assess endometrial stripe   Referral to Dr Enzo Bi       Snoring    Sleep apnea suspected and confirmed.  cpap ordered       Other Visit Diagnoses    Postmenopause bleeding    -  Primary   Relevant Orders   Ambulatory referral to Gynecology   OSA (obstructive sleep apnea)       Relevant Orders   For home use only DME continuous positive airway pressure (CPAP)     A total of 40 minutes was spent with patient more than half of which was spent in counseling patient on the above mentioned issues , reviewing and explaining recent labs and imaging studies done, and coordination of care.  I am having Ms. Kielbasa start on losartan-hydrochlorothiazide. I am also having her  maintain her Cholecalciferol (D3-1000 PO), BIOTIN PO, multivitamin, Calcium Carbonate-Vitamin D (CALCIUM 600 + D PO), Potassium, fish oil-omega-3 fatty acids, Meclizine HCl, albuterol, azelastine, accu-chek softclix, aspirin EC, Magnesium Oxide -Mg Supplement, senna-docusate, atorvastatin, amLODipine, verapamil, omeprazole, sertraline, metFORMIN, fluticasone, ACCU-CHEK AVIVA PLUS, furosemide, lisinopril, glipiZIDE, lisinopril, and sitaGLIPtin.  Meds ordered this encounter  Medications  . losartan-hydrochlorothiazide (HYZAAR) 50-12.5 MG tablet    Sig: Take 1 tablet by mouth daily.    Dispense:  90 tablet    Refill:  0    There are no discontinued medications.  Follow-up: No Follow-up on file.   Crecencio Mc, MD

## 2016-10-18 ENCOUNTER — Encounter: Payer: Self-pay | Admitting: Internal Medicine

## 2016-10-18 NOTE — Assessment & Plan Note (Signed)
Reasonable control for age. no changes today except adding losartan (patient not taking lisinopril)

## 2016-10-18 NOTE — Assessment & Plan Note (Addendum)
With edema aggravated by OSA and use of amlodipine.  bp meds changed,  OSA addressed.  Advised to increase furosemide use  to daily x 3 days,  Then suspend.

## 2016-10-18 NOTE — Assessment & Plan Note (Signed)
Stopping amlodipine due to worsening edema,  Losartan/hct prescribed.

## 2016-10-18 NOTE — Assessment & Plan Note (Signed)
Sleep apnea suspected and confirmed.  cpap ordered

## 2016-10-19 ENCOUNTER — Telehealth: Payer: Self-pay

## 2016-10-19 DIAGNOSIS — Z124 Encounter for screening for malignant neoplasm of cervix: Secondary | ICD-10-CM

## 2016-10-19 NOTE — Telephone Encounter (Signed)
Cytology called no orders place on 10/18/15. Spoke with Dr. Derrel Nip, placed Pap order

## 2016-10-21 LAB — CYTOLOGY - PAP: Diagnosis: NEGATIVE

## 2016-10-23 ENCOUNTER — Encounter: Payer: Self-pay | Admitting: Internal Medicine

## 2016-10-25 ENCOUNTER — Ambulatory Visit: Payer: Medicare Other

## 2016-10-31 ENCOUNTER — Other Ambulatory Visit: Payer: Self-pay | Admitting: Internal Medicine

## 2016-11-16 ENCOUNTER — Encounter: Payer: Self-pay | Admitting: Obstetrics and Gynecology

## 2016-11-16 ENCOUNTER — Ambulatory Visit (INDEPENDENT_AMBULATORY_CARE_PROVIDER_SITE_OTHER): Payer: Medicare Other | Admitting: Obstetrics and Gynecology

## 2016-11-16 VITALS — BP 105/69 | HR 67 | Ht 61.0 in | Wt 177.7 lb

## 2016-11-16 DIAGNOSIS — N950 Postmenopausal bleeding: Secondary | ICD-10-CM | POA: Diagnosis not present

## 2016-11-16 DIAGNOSIS — N83209 Unspecified ovarian cyst, unspecified side: Secondary | ICD-10-CM | POA: Insufficient documentation

## 2016-11-16 DIAGNOSIS — D259 Leiomyoma of uterus, unspecified: Secondary | ICD-10-CM

## 2016-11-16 DIAGNOSIS — N882 Stricture and stenosis of cervix uteri: Secondary | ICD-10-CM | POA: Diagnosis not present

## 2016-11-16 NOTE — Progress Notes (Signed)
GYN ENCOUNTER NOTE  Subjective:  New patient evaluation       Sandra Hendrix is a 71 y.o. G20P2002 female is here for gynecologic evaluation of the following issues:  1. Postmenopausal bleeding 2. History of Pap smear showing endometrial cells present  Patient is referred from Dr. Derrel Nip for evaluation of postmenopausal bleeding. Vaginal bleeding started in early January 2018. Last episode of bleeding was 10/16/2016 patient is not on HRT therapy. Preoperative pelvic ultrasound demonstrates suspected 5.8 cm calcified fibroid; endometrial stripe cannot be assessed due to the fibroid. A previously identified simple ovarian cyst is noted on the left.    10/15/2016 Pap smear demonstrates benign reparative reactive changes with endometrial cells present.   Gynecologic History No LMP recorded. Patient is postmenopausal. Contraception: post menopausal status Last Pap:10/17/2016 negative for intraepithelial lesions or malignancy. Benign reparative/reactive changes. ENDOMETRIAL CELLS PRESENT  Obstetric History OB History  Gravida Para Term Preterm AB Living  2 2 2     2   SAB TAB Ectopic Multiple Live Births          2    # Outcome Date GA Lbr Len/2nd Weight Sex Delivery Anes PTL Lv  2 Term 1972   9 lb (4.082 kg) F Vag-Spont   LIV  1 Term 1970   9 lb (4.082 kg) F Vag-Spont   LIV      Past Medical History:  Diagnosis Date  . Allergy   . Arthritis   . Depression   . Diabetes mellitus without complication ([place])   . GERD (gastroesophageal reflux disease)   . Hyperlipidemia   . Hypertension   . Urinary incontinence   . Varicose veins     Past Surgical History:  Procedure Laterality Date  . CHOLECYSTECTOMY    . COLONOSCOPY WITH PROPOFOL N/A 06/14/2016   Procedure: COLONOSCOPY WITH PROPOFOL;  Surgeon: Lollie Sails, MD;  Location: [facility] ENDOSCOPY;  Service: Endoscopy;  Laterality: N/A;  . ESOPHAGOGASTRODUODENOSCOPY (EGD) WITH PROPOFOL N/A 06/14/2016   Procedure:  ESOPHAGOGASTRODUODENOSCOPY (EGD) WITH PROPOFOL;  Surgeon: Lollie Sails, MD;  Location[facility] ENDOSCOPY;  Service: Endoscopy;  Laterality: N/A;  . JOINT REPLACEMENT Right 2007  . JOINT REPLACEMENT Left 2004  . ROTATOR CUFF REPAIR Right   . UPPER GI ENDOSCOPY     x2    Current Outpatient Prescriptions on File Prior to Visit  Medication Sig Dispense Refill  . ACCU-CHEK AVIVA PLUS test strip Test once daily 100 each 2  . albuterol (PROVENTIL HFA;VENTOLIN HFA) 108 (90 BASE) MCG/ACT inhaler Inhale 2 puffs into the lungs every 6 (six) hours as needed for wheezing.    Marland Kitchen aspirin EC 81 MG tablet Take 81 mg by mouth daily.    Marland Kitchen atorvastatin (LIPITOR) 40 MG tablet TAKE 1 TABLET BY MOUTH  DAILY AT 6 PM. 90 tablet 2  . azelastine (ASTEPRO) 137 MCG/SPRAY nasal spray Place 1 spray into the nose 2 (two) times daily. Use in each nostril as directed (Patient taking differently: Place 1 spray into the nose daily. Use in each nostril as directed) 30 mL 11  . BIOTIN PO Take by mouth. Take 1000mg  by mouth twice daily.    . Calcium Carbonate-Vitamin D (CALCIUM 600 + D PO) Take 1 tablet by mouth daily.    . Cholecalciferol (D3-1000 PO) Take 1,000 Int'l Units by mouth every morning.     . fish oil-omega-3 fatty acids 1000 MG capsule Take 1 capsule by mouth daily.    . fluticasone (FLONASE) 50 MCG/ACT nasal  spray Place 1 spray into both nostrils daily. 48 g 1  . furosemide (LASIX) 20 MG tablet Take 1 tablet by mouth  every other day 45 tablet 2  . glipiZIDE (GLUCOTROL XL) 10 MG 24 hr tablet Take 1 tablet by mouth  daily 90 tablet 2  . Lancet Devices (ACCU-CHEK SOFTCLIX) lancets Use to test blood sugars 1 -2 times daily 1 each 3  . losartan-hydrochlorothiazide (HYZAAR) 50-12.5 MG tablet Take 1 tablet by mouth daily. 90 tablet 0  . Magnesium Oxide -Mg Supplement 250 MG TABS Take 250 mg by mouth daily.    . Meclizine HCl 25 MG CHEW Chew 25 mg by mouth daily as needed (for vertigo).     . metFORMIN (GLUCOPHAGE-XR)  750 MG 24 hr tablet Take 2 tablets by mouth  daily with breakfast 180 tablet 2  . Multiple Vitamin (MULTIVITAMIN) tablet Take 1 tablet by mouth daily.    Marland Kitchen omeprazole (PRILOSEC) 20 MG capsule Take 1 capsule by mouth  daily 90 capsule 2  . Potassium 99 MG TABS Take 1 tablet by mouth daily.    Marland Kitchen senna-docusate (SENOKOT-S) 8.6-50 MG per tablet Take 1 tablet by mouth daily. 30 tablet 0  . sertraline (ZOLOFT) 50 MG tablet Take 1 tablet by mouth  daily 90 tablet 2  . sitaGLIPtin (JANUVIA) 50 MG tablet once daily.    . verapamil (VERELAN PM) 180 MG 24 hr capsule Take 1 capsule by mouth  daily 90 capsule 2   No current facility-administered medications on file prior to visit.     Allergies  Allergen Reactions  . Clindamycin/Lincomycin Rash  . Codeine Rash  . Morphine And Related Nausea Only  . Talwin [Pentazocine] Nausea And Vomiting  . Lisinopril Cough    cough  . Oxycodone-Acetaminophen Other (See Comments)    Pt states this medication makes her bowels stop moving  . Cleocin [Clindamycin Hcl] Rash    Social History   Social History  . Marital status: Married    Spouse name: N/A  . Number of children: N/A  . Years of education: N/A   Occupational History  . Not on file.   Social History Main Topics  . Smoking status: Never Smoker  . Smokeless tobacco: Never Used  . Alcohol use No  . Drug use: No  . Sexual activity: Yes   Other Topics Concern  . Not on file   Social History Narrative  . No narrative on file    Family History  Problem Relation Age of Onset  . Stroke Mother   . Hypertension Mother   . Lupus Mother   . Alcohol abuse Father   . Diabetes Father   . Cancer Father     Liver Cancer  . Arthritis Sister   . Diabetes Sister   . Hyperlipidemia Daughter   . Hypertension Daughter   . Mental retardation Daughter   . Cancer Maternal Aunt     ovarian ca  . Diabetes Paternal Aunt   . Drug abuse Paternal Aunt   . Cancer Maternal Grandfather     colon ca     The following portions of the patient's history were reviewed and updated as appropriate: allergies, current medications, past family history, past medical history, past social history, past surgical history and problem list.  Review of Systems Review of Systems - Negative except for postmenopausal bleeding and right lower quadrant crampy pain   Objective:   BP 105/69   Pulse 67   Ht 5\' 1"  (  1.549 m)   Wt 177 lb 11.2 oz (80.6 kg)   BMI 33.58 kg/m  CONSTITUTIONAL: Well-developed, well-nourished female in no acute distress.  HENT:  Normocephalic, atraumatic.  NECK: not examined SKIN: Skin is warm and dry. No rash noted. Not diaphoretic. No erythema. No pallor. [place]: Alert and oriented to person, place, and time. PSYCHIATRIC: Normal mood and affect. Normal behavior. Normal judgment and thought content. CARDIOVASCULAR:Not Examined RESPIRATORY: Not Examined BREASTS: Not Examined ABDOMEN: Soft, non distended; Non tender.  No Organomegaly. PELVIC:  External Genitalia: Normal  BUS: Normal  Vagina: mild to moderate atrophy  Cervix: Normal; no lesions; no cervical motion tenderness  Uterus: top normal size; normal shape,consistency, mobile, nontender  Adnexa: Normal; nonpalpable nontender  RV: Normal external exam  Bladder: Nontender MUSCULOSKELETAL: Normal range of motion. No tenderness.  No cyanosis, clubbing, or edema.  PROCEDURE:Endometrial Biopsy Procedure Note and endocervical canal dilation  Pre-operative Diagnosis: postmenopausal bleeding; endometrial cells on Pap smear  Post-operative Diagnosis: same as above and cervical stenosis  Procedure Details   Urine pregnancy test was not done.  The risks (including infection, bleeding, pain, and uterine perforation) and benefits of the procedure were explained to the patient and Verbal informed consent was obtained.  Antibiotic prophylaxis against endocarditis was not indicated.   The patient was placed in the dorsal  lithotomy position.  Bimanual exam showed the uterus to be in the neutral position.  A Graves' speculum inserted in the vagina, and the cervix prepped with povidone iodine.  Endocervical curettage with a Kevorkian curette was not performed.   A sharp tenaculum was applied to the anterior lip of the cervix for stabilization. The 3 mm could not be passed through the endocervix. Lacrimal duct probes are used to dilate the endocervical canal . The 3 mm pipette was then used to sound the uterus to a depth of 7cm.  A Mylex 38mm curette was used to sample the endometrium.  Sample was sent for pathologic examination.  Condition: Stable  Complications: None  Plan:  The patient was advised to call for any fever or for prolonged or severe pain or bleeding. She was advised to use OTC acetaminophen as needed for mild to moderate pain. She was advised to avoid vaginal intercourse for 48 hours or until the bleeding has completely stopped.  Attending Physician Documentation: Brayton Mars, MD      Assessment:   1. Post-menopausal bleeding - Pathology  2. Cervical stenosis (uterine cervix), resolved with lacrimal duct probes  3. Uterine leiomyoma, unspecified location, 5.8 cm  4. Cyst of ovary, left - US Pelvis Complete; Future - US Transvaginal Non-OB; Future     Plan:   1. Endocervical canal dilation 2. Endometrial biopsy 3. Results will be made available 4. Return in 6 months for follow-up 5. Return in 1 year for pelvic ultrasound to assess simple ovarian cyst  Brayton Mars, MD  \Note: This dictation was prepared with Dragon dictation along with smaller phrase technology. Any transcriptional errors that result from this process are unintentional.

## 2016-11-16 NOTE — Patient Instructions (Addendum)
1. Endometrial biopsy is completed today 2. Return in 6 months for follow-up on postmenopausal bleeding 3. Return in 1 year for pelvic ultrasound to assess simple ovarian cyst; follow-up appointment with Dr. Enzo Bi will be made   Endometrial Biopsy, Care After Refer to this sheet in the next few weeks. These instructions provide you with information on caring for yourself after your procedure. Your health care provider may also give you more specific instructions. Your treatment has been planned according to current medical practices, but problems sometimes occur. Call your health care provider if you have any problems or questions after your procedure. WHAT TO EXPECT AFTER THE PROCEDURE After your procedure, it is typical to have the following:  You may have mild cramping and a small amount of vaginal bleeding for a few days after the procedure. This is normal. HOME CARE INSTRUCTIONS  Only take over-the-counter or prescription medicine as directed by your health care provider.  Do not douche, use tampons, or have sexual intercourse until your health care provider approves.  Follow your health care provider's instructions regarding any activity restrictions, such as strenuous exercise or heavy lifting. SEEK MEDICAL CARE IF:  You have heavy bleeding or bleeding longer than 2 days after the procedure.  You have bad smelling drainage from your vagina.  You have a fever and chills.  Youhave severe lower stomach (abdominal) pain. SEEK IMMEDIATE MEDICAL CARE IF:  You have severe cramps in your stomach or back.  You pass large blood clots.  Your bleeding increases.  You become weak or lightheaded, or you pass out. This information is not intended to replace advice given to you by your health care provider. Make sure you discuss any questions you have with your health care provider. Document Released: 07/03/2013 Document Reviewed: 07/03/2013 Elsevier Interactive Patient Education   2017 [employer].

## 2016-11-21 ENCOUNTER — Encounter: Payer: Self-pay | Admitting: Obstetrics and Gynecology

## 2016-11-21 ENCOUNTER — Telehealth: Payer: Self-pay | Admitting: Obstetrics and Gynecology

## 2016-11-21 LAB — PATHOLOGY

## 2016-11-21 NOTE — Telephone Encounter (Signed)
Spoke to pt via mychart message

## 2016-11-21 NOTE — Telephone Encounter (Signed)
Patient called stating she had a biopsy of her uterus last week and is still bleeding on and off. She didn't know if this is normal or if she needs to make an appointment. Thanks

## 2016-11-23 ENCOUNTER — Encounter: Payer: Self-pay | Admitting: Internal Medicine

## 2016-11-23 ENCOUNTER — Ambulatory Visit (INDEPENDENT_AMBULATORY_CARE_PROVIDER_SITE_OTHER): Payer: Medicare Other | Admitting: Internal Medicine

## 2016-11-23 VITALS — BP 104/68 | HR 69 | Resp 16 | Wt 179.0 lb

## 2016-11-23 DIAGNOSIS — I10 Essential (primary) hypertension: Secondary | ICD-10-CM | POA: Diagnosis not present

## 2016-11-23 DIAGNOSIS — G4733 Obstructive sleep apnea (adult) (pediatric): Secondary | ICD-10-CM

## 2016-11-23 DIAGNOSIS — E1121 Type 2 diabetes mellitus with diabetic nephropathy: Secondary | ICD-10-CM | POA: Diagnosis not present

## 2016-11-23 DIAGNOSIS — Z6832 Body mass index (BMI) 32.0-32.9, adult: Secondary | ICD-10-CM

## 2016-11-23 DIAGNOSIS — E6609 Other obesity due to excess calories: Secondary | ICD-10-CM | POA: Diagnosis not present

## 2016-11-23 DIAGNOSIS — Z9989 Dependence on other enabling machines and devices: Secondary | ICD-10-CM | POA: Diagnosis not present

## 2016-11-23 DIAGNOSIS — E785 Hyperlipidemia, unspecified: Secondary | ICD-10-CM

## 2016-11-23 DIAGNOSIS — N183 Chronic kidney disease, stage 3 unspecified: Secondary | ICD-10-CM

## 2016-11-23 LAB — POCT GLYCOSYLATED HEMOGLOBIN (HGB A1C): Hemoglobin A1C: 6.9

## 2016-11-23 NOTE — Progress Notes (Signed)
Subjective:  Patient ID: Sandra Hendrix, female    DOB: 1946/07/05  Age: 71 y.o. MRN: [id number]  CC: The primary encounter diagnosis was Hyperlipidemia LDL goal <100. Diagnoses of Type 2 diabetes mellitus with diabetic nephropathy, without long-term current use of insulin ([place]), Essential hypertension, Class 1 obesity due to excess calories with serious comorbidity and body mass index (BMI) of 32.0 to 32.9 in adult, CKD (chronic kidney disease) stage 3, GFR 30-59 ml/min, and OSA on CPAP were also pertinent to this visit.  HPI Raina P Giannone presents for follow up on diabetes, hypertension,  ckd  And newly diagnosed sleep apnea  Medication changes:  Amlodipine stopped,  Losartan started CPAP ordered and received   endometrial biopsy Feb 21:   bleeding stopped 2 days ago repeat biopsy needed to rule out ca due to large fibroid interfering with biopsy   Type 2 Dm:  3 month follow up on diabetes.  Patient has no complaints today.  Patient is following a low glycemic index diet and taking all prescribed medications regularly without side effects.  Fasting sugars have been under less than 140 most of the time and post prandials have been under 160 except on rare occasions. Patient is exercising about 3 times per week and intentionally trying to lose weight .  Patient has had an eye exam in the last 12 months and checks feet regularly for signs of infection.  Patient does not walk barefoot outside,  And denies an numbness tingling or burning in feet. Patient is up to date on all recommended vaccinations.  She has not lost any weight.  Her POCT a1c 6.9.   Down from 7.3  OSA: she has been Wearing cpap , has adjusted well to the mask .    Outpatient Medications Prior to Visit  Medication Sig Dispense Refill  . ACCU-CHEK AVIVA PLUS test strip Test once daily 100 each 2  . albuterol (PROVENTIL HFA;VENTOLIN HFA) 108 (90 BASE) MCG/ACT inhaler Inhale 2 puffs into the lungs every 6 (six) hours as  needed for wheezing.    Marland Kitchen aspirin EC 81 MG tablet Take 81 mg by mouth daily.    Marland Kitchen atorvastatin (LIPITOR) 40 MG tablet TAKE 1 TABLET BY MOUTH  DAILY AT 6 PM. 90 tablet 2  . azelastine (ASTEPRO) 137 MCG/SPRAY nasal spray Place 1 spray into the nose 2 (two) times daily. Use in each nostril as directed (Patient taking differently: Place 1 spray into the nose daily. Use in each nostril as directed) 30 mL 11  . BIOTIN PO Take by mouth. Take 1000mg  by mouth twice daily.    . Calcium Carbonate-Vitamin D (CALCIUM 600 + D PO) Take 1 tablet by mouth daily.    . Cholecalciferol (D3-1000 PO) Take 1,000 Int'l Units by mouth every morning.     . fish oil-omega-3 fatty acids 1000 MG capsule Take 1 capsule by mouth daily.    . fluticasone (FLONASE) 50 MCG/ACT nasal spray Place 1 spray into both nostrils daily. 48 g 1  . furosemide (LASIX) 20 MG tablet Take 1 tablet by mouth  every other day 45 tablet 2  . glipiZIDE (GLUCOTROL XL) 10 MG 24 hr tablet Take 1 tablet by mouth  daily 90 tablet 2  . Lancet Devices (ACCU-CHEK SOFTCLIX) lancets Use to test blood sugars 1 -2 times daily 1 each 3  . losartan-hydrochlorothiazide (HYZAAR) 50-12.5 MG tablet Take 1 tablet by mouth daily. 90 tablet 0  . Magnesium Oxide -Mg Supplement 250 MG  TABS Take 250 mg by mouth daily.    . Meclizine HCl 25 MG CHEW Chew 25 mg by mouth daily as needed (for vertigo).     . metFORMIN (GLUCOPHAGE-XR) 750 MG 24 hr tablet Take 2 tablets by mouth  daily with breakfast 180 tablet 2  . Multiple Vitamin (MULTIVITAMIN) tablet Take 1 tablet by mouth daily.    Marland Kitchen omeprazole (PRILOSEC) 20 MG capsule Take 1 capsule by mouth  daily 90 capsule 2  . Potassium 99 MG TABS Take 1 tablet by mouth daily.    . Probiotic Product (PROBIOTIC-10) CAPS Take by mouth.    . senna-docusate (SENOKOT-S) 8.6-50 MG per tablet Take 1 tablet by mouth daily. 30 tablet 0  . sertraline (ZOLOFT) 50 MG tablet Take 1 tablet by mouth  daily 90 tablet 2  . sitaGLIPtin (JANUVIA) 50 MG  tablet once daily.    . verapamil (VERELAN PM) 180 MG 24 hr capsule Take 1 capsule by mouth  daily 90 capsule 2   No facility-administered medications prior to visit.     Review of Systems;  Patient denies headache, fevers, malaise, unintentional weight loss, skin rash, eye pain, sinus congestion and sinus pain, sore throat, dysphagia,  hemoptysis , cough, dyspnea, wheezing, chest pain, palpitations, orthopnea, edema, abdominal pain, nausea, melena, diarrhea, constipation, flank pain, dysuria, hematuria, urinary  Frequency, nocturia, numbness, tingling, seizures,  Focal weakness, Loss of consciousness,  Tremor, insomnia, depression, anxiety, and suicidal ideation.      Objective:  BP 104/68   Pulse 69   Resp 16   Wt 179 lb (81.2 kg)   SpO2 95%   BMI 33.82 kg/m   BP Readings from Last 3 Encounters:  11/23/16 104/68  11/16/16 105/69  10/17/16 130/82    Wt Readings from Last 3 Encounters:  11/23/16 179 lb (81.2 kg)  11/16/16 177 lb 11.2 oz (80.6 kg)  10/17/16 178 lb 2 oz (80.8 kg)    General appearance: alert, cooperative and appears stated age Ears: normal TM's and external ear canals both ears Throat: lips, mucosa, and tongue normal; teeth and gums normal Neck: no adenopathy, no carotid bruit, supple, symmetrical, trachea midline and thyroid not enlarged, symmetric, no tenderness/mass/nodules Back: symmetric, no curvature. ROM normal. No CVA tenderness. Lungs: clear to auscultation bilaterally Heart: regular rate and rhythm, S1, S2 normal, no murmur, click, rub or gallop Abdomen: soft, non-tender; bowel sounds normal; no masses,  no organomegaly Pulses: 2+ and symmetric Skin: Skin color, texture, turgor normal. No rashes or lesions Lymph nodes: Cervical, supraclavicular, and axillary nodes normal.  Lab Results  Component Value Date   HGBA1C 6.9 11/23/2016   HGBA1C 7.3 (H) 07/29/2016   HGBA1C 7.2 (H) 04/28/2016    Lab Results  Component Value Date   CREATININE 1.01  07/29/2016   CREATININE 0.86 04/28/2016   CREATININE 1.01 12/28/2015    Lab Results  Component Value Date   WBC 5.0 02/24/2015   HGB 11.3 (L) 02/24/2015   HCT 34.6 (L) 02/24/2015   PLT 281 02/24/2015   GLUCOSE 123 (H) 07/29/2016   CHOL 194 07/29/2016   TRIG 83.0 07/29/2016   HDL 101.80 07/29/2016   LDLDIRECT 74.0 04/28/2016   LDLCALC 76 07/29/2016   ALT 15 07/29/2016   AST 19 07/29/2016   NA 141 07/29/2016   K 4.1 07/29/2016   CL 101 07/29/2016   CREATININE 1.01 07/29/2016   BUN 22 07/29/2016   CO2 31 07/29/2016   TSH 2.69 11/07/2014   INR 1.1 01/23/2009  HGBA1C 6.9 11/23/2016   MICROALBUR 2.4 (H) 04/28/2016    US Transvaginal Non-ob  Result Date: 10/17/2016 CLINICAL DATA:  Pain cramping. EXAM: TRANSABDOMINAL AND TRANSVAGINAL ULTRASOUND OF PELVIS TECHNIQUE: Both transabdominal and transvaginal ultrasound examinations of the pelvis were performed. Transabdominal technique was performed for global imaging of the pelvis including uterus, ovaries, adnexal regions, and pelvic cul-de-sac. It was necessary to proceed with endovaginal exam following the transabdominal exam to visualize the uterus and ovaries. COMPARISON:  CT 02/21/2015 . FINDINGS: Uterus Measurements: 7.8 x 4.8 x 6.2 cm. Large 5.8 x 4.3 x 5.3 cm mass is noted within the a uterine body. Echodensities consistent calcifications noted within the mass. Finding most consistent with a fibroid. Endometrium Thickness: Could not identified due to large uterine mass. Right ovary Measurements: 2.6 x 1.4 x 1.4 cm. Normal appearance/no adnexal mass. Left ovary Measurements: 4.6 x 2.4 x 4.4 cm. Normal appearance/no adnexal mass. 4.3 x 2.0 x 3.9 cm simple cyst left ovary. Similar finding noted on prior CT. Other findings No abnormal free fluid. IMPRESSION: Large 5.8 x 4.3 x 5.3 cm calcified mass in uterus consistent large fibroid. 2. 4.3 x 2.0 x 3.9 cm simple cyst left ovary. Similar finding noted on prior CT. This is almost certainly  benign, but follow up ultrasound is recommended in 1 year according to the Society of Radiologists in [place] Statement (D Clovis Riley et al. Management of Asymptomatic Ovarian and Other Adnexal Cysts Imaged at [place]: Society of Radiologists in [place] Statement 2010. Radiology 256 (Sept 2010): [id number].). Electronically Signed   By: Stuart   On: 10/17/2016 11:40   US Pelvis Complete  Result Date: 10/17/2016 CLINICAL DATA:  Pain cramping. EXAM: TRANSABDOMINAL AND TRANSVAGINAL ULTRASOUND OF PELVIS TECHNIQUE: Both transabdominal and transvaginal ultrasound examinations of the pelvis were performed. Transabdominal technique was performed for global imaging of the pelvis including uterus, ovaries, adnexal regions, and pelvic cul-de-sac. It was necessary to proceed with endovaginal exam following the transabdominal exam to visualize the uterus and ovaries. COMPARISON:  CT 02/21/2015 . FINDINGS: Uterus Measurements: 7.8 x 4.8 x 6.2 cm. Large 5.8 x 4.3 x 5.3 cm mass is noted within the a uterine body. Echodensities consistent calcifications noted within the mass. Finding most consistent with a fibroid. Endometrium Thickness: Could not identified due to large uterine mass. Right ovary Measurements: 2.6 x 1.4 x 1.4 cm. Normal appearance/no adnexal mass. Left ovary Measurements: 4.6 x 2.4 x 4.4 cm. Normal appearance/no adnexal mass. 4.3 x 2.0 x 3.9 cm simple cyst left ovary. Similar finding noted on prior CT. Other findings No abnormal free fluid. IMPRESSION: Large 5.8 x 4.3 x 5.3 cm calcified mass in uterus consistent large fibroid. 2. 4.3 x 2.0 x 3.9 cm simple cyst left ovary. Similar finding noted on prior CT. This is almost certainly benign, but follow up ultrasound is recommended in 1 year according to the Society of Radiologists in [place] Statement (D Clovis Riley et al. Management of Asymptomatic Ovarian and Other Adnexal Cysts Imaged at  [place]: Society of Radiologists in [place] Statement 2010. Radiology 256 (Sept 2010): [id number].). Electronically Signed   By: Marcello Moores  Register   On: 10/17/2016 11:40    Assessment & Plan:   Problem List Items Addressed This Visit    CKD (chronic kidney disease) stage 3, GFR 30-59 ml/min    Monitored semi annually by [facility].  GFR has been stable at 60 ml/min  Workup was notable only for a congenitally small right  kidney   By U/S.  Lab Results  Component Value Date   MICROALBUR 2.4 (H) 04/28/2016   Lab Results  Component Value Date   CREATININE 1.01 07/29/2016         DM (diabetes mellitus), type 2 with renal complications ([facility]) (Chronic)    Improved  control .   no changes today.  Continue losartan , ASA, statin,  Januvia, glipizide and metformiin.  Lab Results  Component Value Date   HGBA1C 6.9 11/23/2016   Lab Results  Component Value Date   MICROALBUR 2.4 (H) 04/28/2016         Relevant Orders   POCT glycosylated hemoglobin (Hb A1C) (Completed)   Hemoglobin A1c   Microalbumin / creatinine urine ratio   Essential hypertension   Relevant Orders   Comprehensive metabolic panel   Hyperlipidemia LDL goal <100 - Primary   Relevant Orders   LDL cholesterol, direct   Lipid panel   Obesity    I have addressed  BMI and recommended wt loss of 10% of body weigh over the next 6 months using a low glycemic index diet and regular exercise a minimum of 5 days per week.        OSA on CPAP    Diagnosed by sleep study. She is wearing her CPAP every night a minimum of 6 hours per night and notes improved daytime wakefulness and decreased fatigue          I am having Ms. Iott maintain her Cholecalciferol (D3-1000 PO), BIOTIN PO, multivitamin, Calcium Carbonate-Vitamin D (CALCIUM 600 + D PO), Potassium, fish oil-omega-3 fatty acids, Meclizine HCl, albuterol, azelastine, accu-chek softclix, aspirin EC, Magnesium Oxide -Mg Supplement, senna-docusate, verapamil,  omeprazole, sertraline, metFORMIN, fluticasone, ACCU-CHEK AVIVA PLUS, furosemide, glipiZIDE, sitaGLIPtin, losartan-hydrochlorothiazide, atorvastatin, and PROBIOTIC-10.  No orders of the defined types were placed in this encounter.   There are no discontinued medications.  Follow-up: Return in about 3 months (around 02/20/2017), or labs on or after may 28, ov after that .   Crecencio Mc, MD

## 2016-11-23 NOTE — Progress Notes (Signed)
Pre visit review using our clinic review tool, if applicable. No additional management support is needed unless otherwise documented below in the visit note. 

## 2016-11-23 NOTE — Patient Instructions (Addendum)
You have lowered your A1c to 6.9 Great work!!!!   [email or web address] has a great AFFORDABLE selection of compression garments /stockings in all styles and sizes.   We talked about your weight.   Your Goal to get your BMI < 30 is 158 lbs    We will do a complete panel priro to your next visit in June

## 2016-11-24 DIAGNOSIS — G4733 Obstructive sleep apnea (adult) (pediatric): Secondary | ICD-10-CM | POA: Insufficient documentation

## 2016-11-24 DIAGNOSIS — Z9989 Dependence on other enabling machines and devices: Secondary | ICD-10-CM

## 2016-11-24 NOTE — Assessment & Plan Note (Signed)
Diagnosed by sleep study. She is wearing her CPAP every night a minimum of 6 hours per night and notes improved daytime wakefulness and decreased fatigue  

## 2016-11-24 NOTE — Assessment & Plan Note (Signed)
Improved  control .   no changes today.  Continue losartan , ASA, statin,  Januvia, glipizide and metformiin.  Lab Results  Component Value Date   HGBA1C 6.9 11/23/2016   Lab Results  Component Value Date   MICROALBUR 2.4 (H) 04/28/2016

## 2016-11-24 NOTE — Assessment & Plan Note (Signed)
I have addressed  BMI and recommended wt loss of 10% of body weigh over the next 6 months using a low glycemic index diet and regular exercise a minimum of 5 days per week.   

## 2016-11-24 NOTE — Assessment & Plan Note (Signed)
Monitored semi annually by CCK.  GFR has been stable at 60 ml/min  Workup was notable only for a congenitally small right kidney   By U/S.  Lab Results  Component Value Date   MICROALBUR 2.4 (H) 04/28/2016   Lab Results  Component Value Date   CREATININE 1.01 07/29/2016

## 2016-11-28 ENCOUNTER — Ambulatory Visit (INDEPENDENT_AMBULATORY_CARE_PROVIDER_SITE_OTHER): Payer: Medicare Other

## 2016-11-28 ENCOUNTER — Telehealth: Payer: Self-pay | Admitting: Internal Medicine

## 2016-11-28 VITALS — BP 110/72 | HR 60 | Temp 97.7°F | Resp 14 | Ht 61.0 in | Wt 178.4 lb

## 2016-11-28 DIAGNOSIS — Z0000 Encounter for general adult medical examination without abnormal findings: Secondary | ICD-10-CM | POA: Diagnosis not present

## 2016-11-28 DIAGNOSIS — H9193 Unspecified hearing loss, bilateral: Secondary | ICD-10-CM

## 2016-11-28 NOTE — Telephone Encounter (Signed)
Left a message per PCP note.

## 2016-11-28 NOTE — Patient Instructions (Addendum)
Sandra Hendrix , Thank you for taking time to come for your Medicare Wellness Visit. I appreciate your ongoing commitment to your health goals. Please review the following plan we discussed and let me know if I can assist you in the future.   Follow up with Dr. Derrel Nip as needed.    Bring a copy of your [place] and/or Living Will to be scanned into chart.  Audiology testing; follow as directed.  Have a great day!  These are the goals we discussed: Goals    . Increase physical activity          Stay active and use the exercise bike 3 days a week, 30 minutes       This is a list of the screening recommended for you and due dates:  Health Maintenance  Topic Date Due  . Hemoglobin A1C  05/23/2017  . Complete foot exam   07/29/2017  . Eye exam for diabetics  10/13/2017  . Mammogram  12/07/2017  . Tetanus Vaccine  01/18/2024  . Colon Cancer Screening  06/14/2026  . Flu Shot  Completed  . DEXA scan (bone density measurement)  Completed  .  Hepatitis C: One time screening is recommended by Center for Disease Control  (CDC) for  adults born from [birth year] through 1967.   Completed  . Pneumonia vaccines  Completed      Fall Prevention in the Home Falls can cause injuries. They can happen to people of all ages. There are many things you can do to make your home safe and to help prevent falls. What can I do on the outside of my home?  Regularly fix the edges of walkways and driveways and fix any cracks.  Remove anything that might make you trip as you walk through a door, such as a raised step or threshold.  Trim any bushes or trees on the path to your home.  Use bright outdoor lighting.  Clear any walking paths of anything that might make someone trip, such as rocks or tools.  Regularly check to see if handrails are loose or broken. Make sure that both sides of any steps have handrails.  Any raised decks and porches should have guardrails on the edges.  Have  any leaves, snow, or ice cleared regularly.  Use sand or salt on walking paths during winter.  Clean up any spills in your garage right away. This includes oil or grease spills. What can I do in the bathroom?  Use night lights.  Install grab bars by the toilet and in the tub and shower. Do not use towel bars as grab bars.  Use non-skid mats or decals in the tub or shower.  If you need to sit down in the shower, use a plastic, non-slip stool.  Keep the floor dry. Clean up any water that spills on the floor as soon as it happens.  Remove soap buildup in the tub or shower regularly.  Attach bath mats securely with double-sided non-slip rug tape.  Do not have throw rugs and other things on the floor that can make you trip. What can I do in the bedroom?  Use night lights.  Make sure that you have a light by your bed that is easy to reach.  Do not use any sheets or blankets that are too big for your bed. They should not hang down onto the floor.  Have a firm chair that has side arms. You can use this for  support while you get dressed.  Do not have throw rugs and other things on the floor that can make you trip. What can I do in the kitchen?  Clean up any spills right away.  Avoid walking on wet floors.  Keep items that you use a lot in easy-to-reach places.  If you need to reach something above you, use a strong step stool that has a grab bar.  Keep electrical cords out of the way.  Do not use floor polish or wax that makes floors slippery. If you must use wax, use non-skid floor wax.  Do not have throw rugs and other things on the floor that can make you trip. What can I do with my stairs?  Do not leave any items on the stairs.  Make sure that there are handrails on both sides of the stairs and use them. Fix handrails that are broken or loose. Make sure that handrails are as long as the stairways.  Check any carpeting to make sure that it is firmly attached to the  stairs. Fix any carpet that is loose or worn.  Avoid having throw rugs at the top or bottom of the stairs. If you do have throw rugs, attach them to the floor with carpet tape.  Make sure that you have a light switch at the top of the stairs and the bottom of the stairs. If you do not have them, ask someone to add them for you. What else can I do to help prevent falls?  Wear shoes that:  Do not have high heels.  Have rubber bottoms.  Are comfortable and fit you well.  Are closed at the toe. Do not wear sandals.  If you use a stepladder:  Make sure that it is fully opened. Do not climb a closed stepladder.  Make sure that both sides of the stepladder are locked into place.  Ask someone to hold it for you, if possible.  Clearly mark and make sure that you can see:  Any grab bars or handrails.  First and last steps.  Where the edge of each step is.  Use tools that help you move around (mobility aids) if they are needed. These include:  Canes.  Walkers.  Scooters.  Crutches.  Turn on the lights when you go into a dark area. Replace any light bulbs as soon as they burn out.  Set up your furniture so you have a clear path. Avoid moving your furniture around.  If any of your floors are uneven, fix them.  If there are any pets around you, be aware of where they are.  Review your medicines with your doctor. Some medicines can make you feel dizzy. This can increase your chance of falling. Ask your doctor what other things that you can do to help prevent falls. This information is not intended to replace advice given to you by your health care provider. Make sure you discuss any questions you have with your health care provider. Document Released: 07/09/2009 Document Revised: 02/18/2016 Document Reviewed: 10/17/2014 Elsevier Interactive Patient Education  2017 [employer]     Hearing Loss Hearing loss is a partial or total loss of the ability to hear. This can be  temporary or permanent, and it can happen in one or both ears. Hearing loss may be referred to as deafness. Medical care is necessary to treat hearing loss properly and to prevent the condition from getting worse. Your hearing may partially or completely come back, depending  on what caused your hearing loss and how severe it is. In some cases, hearing loss is permanent. What are the causes? Common causes of hearing loss include:  Too much wax in the ear canal.  Infection of the ear canal or middle ear.  Fluid in the middle ear.  Injury to the ear or surrounding area.  An object stuck in the ear.  Prolonged exposure to loud sounds, such as music. Less common causes of hearing loss include:  Tumors in the ear.  Viral or bacterial infections, such as meningitis.  A hole in the eardrum (perforated eardrum).  Problems with the hearing nerve that sends signals between the brain and the ear.  Certain medicines. What are the signs or symptoms? Symptoms of this condition may include:  Difficulty telling the difference between sounds.  Difficulty following a conversation when there is background noise.  Lack of response to sounds in your environment. This may be most noticeable when you do not respond to startling sounds.  Needing to turn up the volume on the television, radio, etc.  Ringing in the ears.  Dizziness.  Pain in the ears. How is this diagnosed? This condition is diagnosed based on a physical exam and a hearing test (audiometry). The audiometry test will be performed by a hearing specialist (audiologist). You may also be referred to an ear, nose, and throat (ENT) specialist (otolaryngologist). How is this treated? Treatment for recent onset of hearing loss may include:  Ear wax removal.  Being prescribed medicines to prevent infection (antibiotics).  Being prescribed medicines to reduce inflammation (corticosteroids). Follow these instructions at home:  If you  were prescribed an antibiotic medicine, take it as told by your health care provider. Do not stop taking the antibiotic even if you start to feel better.  Take over-the-counter and prescription medicines only as told by your health care provider.  Avoid loud noises.  Return to your normal activities as told by your health care provider. Ask your health care provider what activities are safe for you.  Keep all follow-up visits as told by your health care provider. This is important. Contact a health care provider if:  You feel dizzy.  You develop new symptoms.  You vomit or feel nauseous.  You have a fever. Get help right away if:  You develop sudden changes in your vision.  You have severe ear pain.  You have new or increased weakness.  You have a severe headache. This information is not intended to replace advice given to you by your health care provider. Make sure you discuss any questions you have with your health care provider. Document Released: 09/12/2005 Document Revised: 02/18/2016 Document Reviewed: 01/28/2015 Elsevier Interactive Patient Education  2017 [employer].

## 2016-11-28 NOTE — Progress Notes (Signed)
  I have reviewed the above information and agree with above.   Vivan Agostino, MD 

## 2016-11-28 NOTE — Progress Notes (Signed)
Subjective:   Sandra Hendrix is a 71 y.o. female who presents for Medicare Annual (Subsequent) preventive examination.  Review of Systems:  No ROS.  Medicare Wellness Visit.  Cardiac Risk Factors include: advanced age (>21men, >110 women);hypertension;obesity (BMI >30kg/m2);diabetes mellitus     Objective:     Vitals: BP 110/72 (BP Location: Left Arm, Patient Position: Sitting, Cuff Size: Normal)   Pulse 60   Temp 97.7 F (36.5 C) (Oral)   Resp 14   Ht 5\' 1"  (1.549 m)   Wt 178 lb 6.4 oz (80.9 kg)   SpO2 98%   BMI 33.71 kg/m   Body mass index is 33.71 kg/m.   Tobacco History  Smoking Status  . Never Smoker  Smokeless Tobacco  . Never Used     Counseling given: Not Answered   Past Medical History:  Diagnosis Date  . Allergy   . Arthritis   . Depression   . Diabetes mellitus without complication ([place])   . GERD (gastroesophageal reflux disease)   . Hyperlipidemia   . Hypertension   . Urinary incontinence   . Varicose veins    Past Surgical History:  Procedure Laterality Date  . CHOLECYSTECTOMY    . COLONOSCOPY WITH PROPOFOL N/A 06/14/2016   Procedure: COLONOSCOPY WITH PROPOFOL;  Surgeon: Lollie Sails, MD;  Location: [facility] ENDOSCOPY;  Service: Endoscopy;  Laterality: N/A;  . ESOPHAGOGASTRODUODENOSCOPY (EGD) WITH PROPOFOL N/A 06/14/2016   Procedure: ESOPHAGOGASTRODUODENOSCOPY (EGD) WITH PROPOFOL;  Surgeon: Lollie Sails, MD;  Location[facility] ENDOSCOPY;  Service: Endoscopy;  Laterality: N/A;  . JOINT REPLACEMENT Right 2007  . JOINT REPLACEMENT Left 2004  . ROTATOR CUFF REPAIR Right   . UPPER GI ENDOSCOPY     x2   Family History  Problem Relation Age of Onset  . Stroke Mother   . Hypertension Mother   . Lupus Mother   . Alcohol abuse Father   . Diabetes Father   . Cancer Father     Liver Cancer  . Arthritis Sister   . Diabetes Sister   . Hyperlipidemia Daughter   . Hypertension Daughter   . Mental retardation Daughter   . Cancer Maternal  Aunt     ovarian ca  . Diabetes Paternal Aunt   . Drug abuse Paternal Aunt   . Cancer Maternal Grandfather     colon ca   History  Sexual Activity  . Sexual activity: No    Outpatient Encounter Prescriptions as of 11/28/2016  Medication Sig  . ACCU-CHEK AVIVA PLUS test strip Test once daily  . albuterol (PROVENTIL HFA;VENTOLIN HFA) 108 (90 BASE) MCG/ACT inhaler Inhale 2 puffs into the lungs every 6 (six) hours as needed for wheezing.  Marland Kitchen aspirin EC 81 MG tablet Take 81 mg by mouth daily.  Marland Kitchen atorvastatin (LIPITOR) 40 MG tablet TAKE 1 TABLET BY MOUTH  DAILY AT 6 PM.  . azelastine (ASTEPRO) 137 MCG/SPRAY nasal spray Place 1 spray into the nose 2 (two) times daily. Use in each nostril as directed (Patient taking differently: Place 1 spray into the nose daily. Use in each nostril as directed)  . BIOTIN PO Take by mouth. Take 1000mg  by mouth twice daily.  . Calcium Carbonate-Vitamin D (CALCIUM 600 + D PO) Take 1 tablet by mouth daily.  . Cholecalciferol (D3-1000 PO) Take 1,000 Int'l Units by mouth every morning.   . fish oil-omega-3 fatty acids 1000 MG capsule Take 1 capsule by mouth daily.  . fluticasone (FLONASE) 50 MCG/ACT nasal spray Place  1 spray into both nostrils daily.  . furosemide (LASIX) 20 MG tablet Take 1 tablet by mouth  every other day  . glipiZIDE (GLUCOTROL XL) 10 MG 24 hr tablet Take 1 tablet by mouth  daily  . Lancet Devices (ACCU-CHEK SOFTCLIX) lancets Use to test blood sugars 1 -2 times daily  . losartan-hydrochlorothiazide (HYZAAR) 50-12.5 MG tablet Take 1 tablet by mouth daily.  . Magnesium Oxide -Mg Supplement 250 MG TABS Take 250 mg by mouth daily.  . Meclizine HCl 25 MG CHEW Chew 25 mg by mouth daily as needed (for vertigo).   . metFORMIN (GLUCOPHAGE-XR) 750 MG 24 hr tablet Take 2 tablets by mouth  daily with breakfast  . Multiple Vitamin (MULTIVITAMIN) tablet Take 1 tablet by mouth daily.  Marland Kitchen omeprazole (PRILOSEC) 20 MG capsule Take 1 capsule by mouth  daily  .  Potassium 99 MG TABS Take 1 tablet by mouth daily.  . Probiotic Product (PROBIOTIC-10) CAPS Take by mouth.  . senna-docusate (SENOKOT-S) 8.6-50 MG per tablet Take 1 tablet by mouth daily.  . sertraline (ZOLOFT) 50 MG tablet Take 1 tablet by mouth  daily  . sitaGLIPtin (JANUVIA) 50 MG tablet once daily.  . verapamil (VERELAN PM) 180 MG 24 hr capsule Take 1 capsule by mouth  daily   No facility-administered encounter medications on file as of 11/28/2016.     Activities of Daily Living In your present state of health, do you have any difficulty performing the following activities: 11/28/2016  Hearing? Y  Vision? N  Difficulty concentrating or making decisions? N  Walking or climbing stairs? N  Dressing or bathing? N  Doing errands, shopping? N  Preparing Food and eating ? N  Using the Toilet? N  In the past six months, have you accidently leaked urine? Y  Do you have problems with loss of bowel control? N  Managing your Medications? N  Managing your Finances? N  Housekeeping or managing your Housekeeping? N  Some recent data might be hidden    Patient Care Team: Crecencio Mc, MD as PCP - General (Internal Medicine)    Assessment:    This is a routine wellness examination for Sandra Hendrix. The goal of the wellness visit is to assist the patient how to close the gaps in care and create a preventative care plan for the patient.   Taking calcium VIT D as appropriate/Osteoporosis risk reviewed.  Medications reviewed; taking without issues or barriers.  Safety issues reviewed; smoke detectors in the home. Firearms locked up in the home. Wears seatbelts when driving or riding with others. Patient does wear sunscreen or protective clothing when in direct sunlight. No violence in the home.  Patient is alert, normal appearance, oriented to person/place/and time. Correctly identified the president of the [place], recall of 3/3 objects, and performing simple calculations.  Patient displays  appropriate judgement and can read correct time from watch face.  No new identified risk were noted.  No failures at ADL's or IADL's.   BMI- discussed the importance of a healthy diet, water intake and exercise. Educational material provided.   Diet: Breakfast: She does not eat until 1pm Lunch: 2 eggs, toast Dinner: Meat, green vegetable Daily fluid intake: 3 cups of caffeine, 3 cups of water  HTN- followed by PCP.  Dental- every six months. Dr. Geraldo Docker.  Eye- Visual acuity not assessed per patient preference since they have regular follow up with the ophthalmologist.  Wears corrective lenses.  Sleep patterns- Sleeps 5-6 hours at  night.  Wakes feeling rested. CPAP in use.   Health maintenance gaps- closed.  Patient Concerns: None at this time. Follow up with PCP as needed.  Exercise Activities and Dietary recommendations Current Exercise Habits: The patient does not participate in regular exercise at present  Goals    . Increase physical activity          Stay active and use the exercise bike 3 days a week, 30 minutes      Fall Risk Fall Risk  11/28/2016 10/05/2016 01/04/2016 10/26/2015 09/10/2015  Falls in the past year? Yes Yes Yes Yes Yes  Number falls in past yr: 2 or more 2 or more 1 1 1   Injury with Fall? No No Yes Yes No  Risk for fall due to : - - - - -  Follow up Falls prevention discussed;Education provided - Falls prevention discussed Education provided;Falls prevention discussed Falls evaluation completed   Depression Screen PHQ 2/9 Scores 11/28/2016 10/05/2016 01/04/2016 10/26/2015  PHQ - 2 Score 0 0 0 0     Cognitive Function MMSE - Mini Mental State Exam 11/28/2016 10/26/2015  Orientation to time 5 5  Orientation to Place 5 5  Registration 3 3  Attention/ Calculation 5 5  Recall 3 3  Language- name 2 objects 2 2  Language- repeat 1 1  Language- follow 3 step command 3 3  Language- read & follow direction 1 1  Write a sentence 1 1  Copy design 1 1    Total score 30 30        Immunization History  Administered Date(s) Administered  . Influenza Split 05/21/2013, 06/06/2014  . Influenza,inj,Quad PF,36+ Mos 06/05/2015  . Influenza-Unspecified 04/26/2012, 06/14/2016  . Pneumococcal Conjugate-13 10/21/2013  . Pneumococcal Polysaccharide-23 07/15/2004, 05/08/2010  . Td 11/22/2003  . Tdap 01/17/2014  . Zoster 01/17/2014   Screening Tests Health Maintenance  Topic Date Due  . HEMOGLOBIN A1C  05/23/2017  . FOOT EXAM  07/29/2017  . OPHTHALMOLOGY EXAM  10/13/2017  . MAMMOGRAM  12/07/2017  . TETANUS/TDAP  01/18/2024  . COLONOSCOPY  06/14/2026  . INFLUENZA VACCINE  Completed  . DEXA SCAN  Completed  . Hepatitis C Screening  Completed  . PNA vac Low Risk Adult  Completed      Plan:    End of life planning; Advance aging; Advanced directives discussed. Copy of current HCPOA/Living Will requested.    Medicare Attestation I have personally reviewed: The patient's medical and social history Their use of alcohol, tobacco or illicit drugs Their current medications and supplements The patient's functional ability including ADLs,fall risks, home safety risks, cognitive, and hearing and visual impairment Diet and physical activities Evidence for depression   The patient's weight, height, BMI, and visual acuity have been recorded in the chart.  I have made referrals and provided education to the patient based on review of the above and I have provided the patient with a written personalized care plan for preventive services.    During the course of the visit the patient was educated and counseled about the following appropriate screening and preventive services:   Vaccines to include Pneumoccal, Influenza, Hepatitis B, Td, Zostavax, HCV  Colorectal cancer screening-UTD  Bone density screening- UTD  Diabetes -followed by PCP  Glaucoma screening-annual visits with her ophthalmologist  Mammography-UTD  Nutrition counseling    Patient Instructions (the written plan) was given to the patient.   Varney Biles, LPN  [id number]

## 2016-11-28 NOTE — Telephone Encounter (Signed)
  Your referral is in process to [facility]  as discussed. Our referral coordinator will call you when the appointment has been made.  If you do not hear from [facility] in our office in a week,  Please call us back

## 2016-11-28 NOTE — Telephone Encounter (Signed)
-----   Message from Dia Crawford, LPN sent at [id number] 11:02 AM EST ----- Regarding: Audiology refererral request She requests a referral to audiology for changes in her hearing. Difficulty hearing conversations on the telephone and in conversation with crowds.  OK with [facility]. Please order  At your convenience and if OK with you.  Thanks you, Denisa

## 2016-11-29 LAB — VITAMIN D 25 HYDROXY (VIT D DEFICIENCY, FRACTURES): Vit D, 25-Hydroxy: 42.4

## 2016-11-29 LAB — BASIC METABOLIC PANEL
BUN: 21 (ref 4–21)
Potassium: 3.9 (ref 3.4–5.3)

## 2016-11-30 ENCOUNTER — Encounter: Payer: Self-pay | Admitting: Obstetrics and Gynecology

## 2016-11-30 ENCOUNTER — Ambulatory Visit (INDEPENDENT_AMBULATORY_CARE_PROVIDER_SITE_OTHER): Payer: Medicare Other | Admitting: Obstetrics and Gynecology

## 2016-11-30 VITALS — BP 113/71 | HR 65 | Ht 61.0 in | Wt 178.1 lb

## 2016-11-30 DIAGNOSIS — D259 Leiomyoma of uterus, unspecified: Secondary | ICD-10-CM

## 2016-11-30 DIAGNOSIS — N950 Postmenopausal bleeding: Secondary | ICD-10-CM | POA: Diagnosis not present

## 2016-11-30 NOTE — Patient Instructions (Signed)
1. Hysteroscopy/D&C will be scheduled to evaluate postmenopausal bleeding 2. Return the week before surgery for preoperative appointment  Hysteroscopy Hysteroscopy is a procedure used for looking inside the womb (uterus). It may be done for various reasons, including:  To evaluate abnormal bleeding, fibroid (benign, noncancerous) tumors, polyps, scar tissue (adhesions), and possibly cancer of the uterus.  To look for lumps (tumors) and other uterine growths.  To look for causes of why a woman cannot get pregnant (infertility), causes of recurrent loss of pregnancy (miscarriages), or a lost intrauterine device (IUD).  To perform a sterilization by blocking the fallopian tubes from inside the uterus. In this procedure, a thin, flexible tube with a tiny light and camera on the end of it (hysteroscope) is used to look inside the uterus. A hysteroscopy should be done right after a menstrual period to be sure you are not pregnant. LET [facility] CARE PROVIDER KNOW ABOUT:  Any allergies you have.  All medicines you are taking, including vitamins, herbs, eye drops, creams, and over-the-counter medicines.  Previous problems you or members of your family have had with the use of anesthetics.  Any blood disorders you have.  Previous surgeries you have had.  Medical conditions you have. RISKS AND COMPLICATIONS Generally, this is a safe procedure. However, as with any procedure, complications can occur. Possible complications include:  Putting a hole in the uterus.  Excessive bleeding.  Infection.  Damage to the cervix.  Injury to other organs.  Allergic reaction to medicines.  Too much fluid used in the uterus for the procedure. BEFORE THE PROCEDURE  Ask your health care provider about changing or stopping any regular medicines.  Do not take aspirin or blood thinners for 1 week before the procedure, or as directed by your health care provider. These can cause bleeding.  If you  smoke, do not smoke for 2 weeks before the procedure.  In some cases, a medicine is placed in the cervix the day before the procedure. This medicine makes the cervix have a larger opening (dilate). This makes it easier for the instrument to be inserted into the uterus during the procedure.  Do not eat or drink anything for at least 8 hours before the surgery.  Arrange for someone to take you home after the procedure. PROCEDURE  You may be given a medicine to relax you (sedative). You may also be given one of the following:  A medicine that numbs the area around the cervix (local anesthetic).  A medicine that makes you sleep through the procedure (general anesthetic).  The hysteroscope is inserted through the vagina into the uterus. The camera on the hysteroscope sends a picture to a TV screen. This gives the surgeon a good view inside the uterus.  During the procedure, air or a liquid is put into the uterus, which allows the surgeon to see better.  Sometimes, tissue is gently scraped from inside the uterus. These tissue samples are sent to a lab for testing. What to expect after the procedure  If you had a general anesthetic, you may be groggy for a couple hours after the procedure.  If you had a local anesthetic, you will be able to go home as soon as you are stable and feel ready.  You may have some cramping. This normally lasts for a couple days.  You may have bleeding, which varies from light spotting for a few days to menstrual-like bleeding for 3-7 days. This is normal.  If your test results are  not back during the visit, make an appointment with your health care provider to find out the results. This information is not intended to replace advice given to you by your health care provider. Make sure you discuss any questions you have with your health care provider. Document Released: 12/19/2000 Document Revised: 02/18/2016 Document Reviewed: 04/11/2013 Elsevier Interactive  Patient Education  2017 [employer].  Dilation and Curettage or Vacuum Curettage Dilation and curettage (D&C) and vacuum curettage are minor procedures. A D&C involves stretching (dilation) the cervix and scraping (curettage) the inside lining of the uterus (endometrium). During a D&C, tissue is gently scraped from the endometrium, starting from the top portion of the uterus down to the lowest part of the uterus (cervix). During a vacuum curettage, the lining and tissue in the uterus are removed with the use of gentle suction. Curettage may be performed to either diagnose or treat a problem. As a diagnostic procedure, curettage is performed to examine tissues from the uterus. A diagnostic curettage may be done if you have:  Irregular bleeding in the uterus.  Bleeding with the development of clots.  Spotting between menstrual periods.  Prolonged menstrual periods or other abnormal bleeding.  Bleeding after menopause.  No menstrual period (amenorrhea).  A change in size and shape of the uterus.  Abnormal endometrial cells discovered during a Pap test. As a treatment procedure, curettage may be performed for the following reasons:  Removal of an IUD (intrauterine device).  Removal of retained placenta after giving birth.  Abortion.  Miscarriage.  Removal of endometrial polyps.  Removal of uncommon types of noncancerous lumps (fibroids). Tell a health care provider about:  Any allergies you have, including allergies to prescribed medicine or latex.  All medicines you are taking, including vitamins, herbs, eye drops, creams, and over-the-counter medicines. This is especially important if you take any blood-thinning medicine. Bring a list of all of your medicines to your appointment.  Any problems you or family members have had with anesthetic medicines.  Any blood disorders you have.  Any surgeries you have had.  Your medical history and any medical conditions you  have.  Whether you are pregnant or may be pregnant.  Recent vaginal infections you have had.  Recent menstrual periods, bleeding problems you have had, and what form of birth control (contraception) you use. What are the risks? Generally, this is a safe procedure. However, problems may occur, including:  Infection.  Heavy vaginal bleeding.  Allergic reactions to medicines.  Damage to the cervix or other structures or organs.  Development of scar tissue (adhesions) inside the uterus, which can cause abnormal amounts of menstrual bleeding. This may make it harder to get pregnant in the future.  A hole (perforation) or puncture in the uterine wall. This is rare. What happens before the procedure? Staying hydrated  Follow instructions from your health care provider about hydration, which may include:  Up to 2 hours before the procedure - you may continue to drink clear liquids, such as water, clear fruit juice, black coffee, and plain tea. Eating and drinking restrictions  Follow instructions from your health care provider about eating and drinking, which may include:  8 hours before the procedure - stop eating heavy meals or foods such as meat, fried foods, or fatty foods.  6 hours before the procedure - stop eating light meals or foods, such as toast or cereal.  6 hours before the procedure - stop drinking milk or drinks that contain milk.  2 hours  before the procedure - stop drinking clear liquids. If your health care provider told you to take your medicine(s) on the day of your procedure, take them with only a sip of water. Medicines   Ask your health care provider about:  Changing or stopping your regular medicines. This is especially important if you are taking diabetes medicines or blood thinners.  Taking medicines such as aspirin and ibuprofen. These medicines can thin your blood. Do not take these medicines before your procedure if your health care provider instructs  you not to.  You may be given antibiotic medicine to help prevent infection. General instructions   For 24 hours before your procedure, do not:  [place].  Use tampons.  Use medicines, creams, or suppositories in the vagina.  Have sexual intercourse.  You may be given a pregnancy test on the day of the procedure.  Plan to have someone take you home from the hospital or clinic.  You may have a blood or urine sample taken.  If you will be going home right after the procedure, plan to have someone with you for 24 hours. What happens during the procedure?  To reduce your risk of infection:  Your health care team will wash or sanitize their hands.  Your skin will be washed with soap.  An IV tube will be inserted into one of your veins.  You will be given one of the following:  A medicine that numbs the area in and around the cervix (local anesthetic).  A medicine to make you fall asleep (general anesthetic).  You will lie down on your back, with your feet in foot rests (stirrups).  The size and position of your uterus will be checked.  A lubricated instrument (speculum or Sims retractor) will be inserted into the back side of your vagina. The speculum will be used to hold apart the walls of your vagina so your health care provider can see your cervix.  A tool (tenaculum) will be attached to the lip of the cervix to stabilize it.  Your cervix will be softened and dilated. This may be done by:  Taking a medicine.  Having tapered dilators or thin rods (laminaria) or gradual widening instruments (tapered dilators) inserted into your cervix.  A small, sharp, curved instrument (curette) will be used to scrape a small amount of tissue or cells from the endometrium or cervical canal. In some cases, gentle suction is applied with the curette. The curette will then be removed. The cells will be taken to a lab for testing. The procedure may vary among health care providers and  hospitals. What happens after the procedure?  You may have mild cramping, backache, pain, and light bleeding or spotting. You may pass small blood clots from your vagina.  You may have to wear compression stockings. These stockings help to prevent blood clots and reduce swelling in your legs.  Your blood pressure, heart rate, breathing rate, and blood oxygen level will be monitored until the medicines you were given have worn off. Summary  Dilation and curettage (D&C) involves stretching (dilation) the cervix and scraping (curettage) the inside lining of the uterus (endometrium).  After the procedure, you may have mild cramping, backache, pain, and light bleeding or spotting. You may pass small blood clots from your vagina.  Plan to have someone take you home from the hospital or clinic. This information is not intended to replace advice given to you by your health care provider. Make sure you discuss any questions  you have with your health care provider. Document Released: 09/12/2005 Document Revised: 05/29/2016 Document Reviewed: 05/29/2016 Elsevier Interactive Patient Education  2017 [employer].

## 2016-11-30 NOTE — Progress Notes (Signed)
Chief complaint: 1. Postmenopausal bleeding 2. Follow-up on endometrial biopsy  Patient presents for follow-up.  Endometrial biopsy pathology: ENDOMETRIUM, BIOPSY:  SCANT AND MINUTE FRAGMENTS OF ATYPICAL ENDOMETRIUM WITH GLANDULAR  CROWDING. BACKGROUND STRIPS OF ATROPHIC ENDOMETRIAL EPITHELIUM  ARE ALSO PRESENT (SEE NOTE).  NOTE: Additional sampling is recommended to rule out hyperplasia  and carcinoma.   Findings from endometrial biopsy are reviewed. Additional workup was discussed.  OBJECTIVE: BP 113/71   Pulse 65   Ht 5\' 1"  (1.549 m)   Wt 178 lb 1.6 oz (80.8 kg)   BMI 33.65 kg/m   Physical exam deferred  ASSESSMENT: 1. Postmenopausal bleeding 2. Endometrial biopsy notable for atypical endometrium with glandular crowding; additional sampling is recommended to rule out endometrial hyperplasia or carcinoma  PLAN: 1. Schedule hysteroscopy/D&C 2. Return in 1 week prior to surgery for preoperative appointment 3. Literature on procedures are given to patient  A total of 15 minutes were spent face-to-face with the patient during this encounter and over half of that time dealt with counseling and coordination of care.  Brayton Mars, MD  Note: This dictation was prepared with Dragon dictation along with smaller phrase technology. Any transcriptional errors that result from this process are unintentional. .mad

## 2016-12-02 NOTE — Progress Notes (Signed)
  I have reviewed the above information and agree with above.   Teresa Tullo, MD 

## 2016-12-06 ENCOUNTER — Encounter: Payer: Medicare Other | Admitting: Obstetrics and Gynecology

## 2016-12-09 LAB — BASIC METABOLIC PANEL: Creatinine: 1 (ref <=1.1)

## 2016-12-28 ENCOUNTER — Encounter: Payer: Self-pay | Admitting: Obstetrics and Gynecology

## 2016-12-28 ENCOUNTER — Ambulatory Visit (INDEPENDENT_AMBULATORY_CARE_PROVIDER_SITE_OTHER): Payer: Medicare Other | Admitting: Obstetrics and Gynecology

## 2016-12-28 VITALS — BP 102/66 | HR 69 | Wt 181.1 lb

## 2016-12-28 DIAGNOSIS — N882 Stricture and stenosis of cervix uteri: Secondary | ICD-10-CM

## 2016-12-28 DIAGNOSIS — N950 Postmenopausal bleeding: Secondary | ICD-10-CM

## 2016-12-28 DIAGNOSIS — D259 Leiomyoma of uterus, unspecified: Secondary | ICD-10-CM

## 2016-12-28 DIAGNOSIS — Z01818 Encounter for other preprocedural examination: Secondary | ICD-10-CM

## 2016-12-28 NOTE — Progress Notes (Signed)
GYN ENCOUNTER NOTE  Subjective:  PREOPERATIVE HISTORY AND PHYSICAL    Date of surgery: 01/02/2017 Procedure: Hysteroscopy/D&C Diagnosis: 1. Postmenopausal bleeding 2. Cervical stenosis 3. Uterine fibroids 4. Atypical glandular cells on endometrial biopsy     Sandra Hendrix is a 71 y.o. [id number] female is here for gynecologic evaluation of the following issues:  1. Postmenopausal bleeding 2. Atypical glandular cells on endometrial biopsy  Patient had episode of postmenopausal bleeding. Pap smear showed endometrial cells present. Endometrial biopsy absolutely performed was abnormal  Endometrial biopsy pathology: ENDOMETRIUM, BIOPSY:  SCANT AND MINUTE FRAGMENTS OF ATYPICAL ENDOMETRIUM WITH GLANDULAR  CROWDING. BACKGROUND STRIPS OF ATROPHIC ENDOMETRIAL EPITHELIUM  ARE ALSO PRESENT (SEE NOTE).  NOTE: Additional sampling is recommended to rule out hyperplasia  and carcinoma.    10/17/2016 ultrasound findings FINDINGS: Uterus  Measurements: 7.8 x 4.8 x 6.2 cm. Large 5.8 x 4.3 x 5.3 cm mass is noted within the a uterine body. Echodensities consistent calcifications noted within the mass. Finding most consistent with a fibroid.  Endometrium  Thickness: Could not identified due to large uterine mass.  Right ovary  Measurements: 2.6 x 1.4 x 1.4 cm. Normal appearance/no adnexal mass.  Left ovary  Measurements: 4.6 x 2.4 x 4.4 cm. Normal appearance/no adnexal mass. 4.3 x 2.0 x 3.9 cm simple cyst left ovary. Similar finding noted on prior CT.  Other findings  No abnormal free fluid.  Gynecologic History No LMP recorded. Patient is postmenopausal. Contraception: post menopausal status Last Pap:10/17/2016 negative for intraepithelial lesions or malignancy. Benign reparative/reactive changes. ENDOMETRIAL CELLS PRESENT  Obstetric History OB History  Gravida Para Term Preterm AB Living  2 2 2     2   SAB TAB Ectopic Multiple Live Births          2    # Outcome  Date GA Lbr Len/2nd Weight Sex Delivery Anes PTL Lv  2 Term 1972   9 lb (4.082 kg) F Vag-Spont   LIV  1 Term 1970   9 lb (4.082 kg) F Vag-Spont   LIV      Past Medical History:  Diagnosis Date  . Allergy   . Arthritis   . Depression   . Diabetes mellitus without complication ([place])   . GERD (gastroesophageal reflux disease)   . Hyperlipidemia   . Hypertension   . Urinary incontinence   . Varicose veins     Past Surgical History:  Procedure Laterality Date  . CHOLECYSTECTOMY    . COLONOSCOPY WITH PROPOFOL N/A 06/14/2016   Procedure: COLONOSCOPY WITH PROPOFOL;  Surgeon: Lollie Sails, MD;  Location[facility] ENDOSCOPY;  Service: Endoscopy;  Laterality: N/A;  . ESOPHAGOGASTRODUODENOSCOPY (EGD) WITH PROPOFOL N/A 06/14/2016   Procedure: ESOPHAGOGASTRODUODENOSCOPY (EGD) WITH PROPOFOL;  Surgeon: Lollie Sails, MD;  Location: [facility] ENDOSCOPY;  Service: Endoscopy;  Laterality: N/A;  . JOINT REPLACEMENT Right 2007  . JOINT REPLACEMENT Left 2004  . ROTATOR CUFF REPAIR Right   . UPPER GI ENDOSCOPY     x2    Current Outpatient Prescriptions on File Prior to Visit  Medication Sig Dispense Refill  . ACCU-CHEK AVIVA PLUS test strip Test once daily 100 each 2  . albuterol (PROVENTIL HFA;VENTOLIN HFA) 108 (90 BASE) MCG/ACT inhaler Inhale 2 puffs into the lungs every 6 (six) hours as needed for wheezing.    . Ascorbic Acid (VITAMIN C) 1000 MG tablet Take 1,000 mg by mouth daily.     Marland Kitchen aspirin EC 81 MG tablet Take 81 mg by mouth every evening.     Marland Kitchen  atorvastatin (LIPITOR) 40 MG tablet TAKE 1 TABLET BY MOUTH  DAILY AT 6 PM. (Patient taking differently: TAKE 20 mg BY MOUTH  DAILY AT 6 PM.) 90 tablet 2  . azelastine (ASTEPRO) 137 MCG/SPRAY nasal spray Place 1 spray into the nose 2 (two) times daily. Use in each nostril as directed (Patient taking differently: Place 1 spray into the nose daily. Use in each nostril as directed) 30 mL 11  . BIOTIN PO Take 1 tablet by mouth 2 (two) times daily. Take  1000mg  by mouth twice daily.     . Cholecalciferol (D3-1000 PO) Take 1,000 Int'l Units by mouth every morning.     Marland Kitchen CINNAMON PO Take 1,000 mg by mouth 2 (two) times daily.    . fish oil-omega-3 fatty acids 1000 MG capsule Take 1 capsule by mouth daily.    . fluticasone (FLONASE) 50 MCG/ACT nasal spray Place 1 spray into both nostrils daily. 48 g 1  . furosemide (LASIX) 20 MG tablet Take 1 tablet by mouth  every other day 45 tablet 2  . glipiZIDE (GLUCOTROL XL) 10 MG 24 hr tablet Take 1 tablet by mouth  daily 90 tablet 2  . Lancet Devices (ACCU-CHEK SOFTCLIX) lancets Use to test blood sugars 1 -2 times daily 1 each 3  . loratadine (CLARITIN) 10 MG tablet Take 10 mg by mouth daily.    Marland Kitchen losartan-hydrochlorothiazide (HYZAAR) 50-12.5 MG tablet Take 1 tablet by mouth daily. 90 tablet 0  . Meclizine HCl 25 MG CHEW Chew 25 mg by mouth daily as needed (for vertigo).     . metFORMIN (GLUCOPHAGE-XR) 750 MG 24 hr tablet Take 2 tablets by mouth  daily with breakfast 180 tablet 2  . Multiple Vitamin (MULTIVITAMIN) tablet Take 1 tablet by mouth daily.    Marland Kitchen omeprazole (PRILOSEC) 20 MG capsule Take 1 capsule by mouth  daily 90 capsule 2  . Potassium 99 MG TABS Take 1 tablet by mouth daily.    . Probiotic Product (PROBIOTIC-10) CAPS Take 1 capsule by mouth daily.     Marland Kitchen senna-docusate (SENOKOT-S) 8.6-50 MG per tablet Take 1 tablet by mouth daily. 30 tablet 0  . sertraline (ZOLOFT) 50 MG tablet Take 1 tablet by mouth  daily 90 tablet 2  . sitaGLIPtin (JANUVIA) 50 MG tablet One tablet daily    . verapamil (VERELAN PM) 180 MG 24 hr capsule Take 1 capsule by mouth  daily 90 capsule 2   No current facility-administered medications on file prior to visit.     Allergies  Allergen Reactions  . Clindamycin/Lincomycin Rash  . Codeine Rash  . Morphine And Related Nausea Only  . Talwin [Pentazocine] Nausea And Vomiting  . Lisinopril Cough    cough  . Oxycodone-Acetaminophen Other (See Comments)    Pt states this  medication makes her bowels stop moving  . Cleocin [Clindamycin Hcl] Rash    Social History   Social History  . Marital status: Married    Spouse name: N/A  . Number of children: N/A  . Years of education: N/A   Occupational History  . Not on file.   Social History Main Topics  . Smoking status: Never Smoker  . Smokeless tobacco: Never Used  . Alcohol use No  . Drug use: No  . Sexual activity: No   Other Topics Concern  . Not on file   Social History Narrative  . No narrative on file    Family History  Problem Relation Age of Onset  .  Stroke Mother   . Hypertension Mother   . Lupus Mother   . Alcohol abuse Father   . Diabetes Father   . Cancer Father     Liver Cancer  . Arthritis Sister   . Diabetes Sister   . Hyperlipidemia Daughter   . Hypertension Daughter   . Mental retardation Daughter   . Cancer Maternal Aunt     ovarian ca  . Diabetes Paternal Aunt   . Drug abuse Paternal Aunt   . Cancer Maternal Grandfather     colon ca    The following portions of the patient's history were reviewed and updated as appropriate: allergies, current medications, past family history, past medical history, past social history, past surgical history and problem list.  Review of Systems Review of Systems - General ROS: negative for - chills, fatigue, fever, hot flashes, malaise or night sweats Hematological and Lymphatic ROS: negative for - bleeding problems or swollen lymph nodes Gastrointestinal ROS: negative for - abdominal pain, blood in stools, change in bowel habits and nausea/vomiting Musculoskeletal ROS: negative for - joint pain, muscle pain or muscular weakness Genito-Urinary ROS: negative for - change in menstrual cycle, dysmenorrhea, dyspareunia, dysuria, genital discharge, genital ulcers, hematuria, incontinence, irregular/heavy menses, nocturia or pelvic painjj  Objective:   BP 102/66   Pulse 69   Wt 181 lb 1.6 oz (82.1 kg)   BMI 34.22 kg/m   CONSTITUTIONAL: Well-developed, well-nourished female in no acute distress.  HENT:  Normocephalic, atraumatic.  NECK: Normal range of motion, supple, no masses.  Normal thyroid.  SKIN: Skin is warm and dry. No rash noted. Not diaphoretic. No erythema. No pallor. [place]: Alert and oriented to person, place, and time. PSYCHIATRIC: Normal mood and affect. Normal behavior. Normal judgment and thought content. CARDIOVASCULAR: Regular rate and rhythm without murmur RESPIRATORY: Clear BREASTS: Not Examined ABDOMEN: Soft, non distended; Non tender.  No Organomegaly. PELVIC: (11/16/2016)             External Genitalia: Normal             BUS: Normal             Vagina: mild to moderate atrophy             Cervix: Normal; no lesions; no cervical motion tenderness             Uterus: top normal size; normal shape,consistency, mobile, nontender             Adnexa: Normal; nonpalpable nontender             RV: Normal external exam             Bladder: Nontender MUSCULOSKELETAL: Normal range of motion. No tenderness.  No cyanosis, clubbing, or edema.     Assessment:   1. Preoperative examination  2. Post-menopausal bleeding  3. Uterine leiomyoma, unspecified location  4. Cervical stenosis (uterine cervix)  5. Atypical glandular cells on Pap smear/endometrial biopsy      Plan:   Hysteroscopy/D&C  Prep calcium: Patient is to undergo hysteroscopy/D&C for evaluation of postmenopausal bleeding and atypical glandular cells found on endometrial biopsy. Patient is understanding of the planned procedure and is aware of and is accepting of all surgical risks which include but are not limited to bleeding, infection, pelvic organ injury with need for repair, blood clot disorders, anesthesia risks, etc. All questions have been answered. Informed consent is given. Patient is ready and willing to proceed with surgery as scheduled.  Alanda Slim  Myra Weng, MD  Note: This dictation was prepared with  Dragon dictation along with smaller phrase technology. Any transcriptional errors that result from this process are unintentional.

## 2016-12-28 NOTE — Patient Instructions (Signed)
1.  Return for postop check 1 week after surgery 

## 2016-12-28 NOTE — H&P (Signed)
GYN ENCOUNTER NOTE  Subjective:  PREOPERATIVE HISTORY AND PHYSICAL    Date of surgery: 01/02/2017 Procedure: Hysteroscopy/D&C Diagnosis: 1. Postmenopausal bleeding 2. Cervical stenosis 3. Uterine fibroids 4. Atypical glandular cells on endometrial biopsy     Sandra Hendrix is a 71 y.o. G16P2002 female is here for gynecologic evaluation of the following issues:  1. Postmenopausal bleeding 2. Atypical glandular cells on endometrial biopsy  Patient had episode of postmenopausal bleeding. Pap smear showed endometrial cells present. Endometrial biopsy absolutely performed was abnormal  Endometrial biopsy pathology: ENDOMETRIUM, BIOPSY:  SCANT AND MINUTE FRAGMENTS OF ATYPICAL ENDOMETRIUM WITH GLANDULAR  CROWDING. BACKGROUND STRIPS OF ATROPHIC ENDOMETRIAL EPITHELIUM  ARE ALSO PRESENT (SEE NOTE).  NOTE: Additional sampling is recommended to rule out hyperplasia  and carcinoma.    10/17/2016 ultrasound findings FINDINGS: Uterus  Measurements: 7.8 x 4.8 x 6.2 cm. Large 5.8 x 4.3 x 5.3 cm mass is noted within the a uterine body. Echodensities consistent calcifications noted within the mass. Finding most consistent with a fibroid.  Endometrium  Thickness: Could not identified due to large uterine mass.  Right ovary  Measurements: 2.6 x 1.4 x 1.4 cm. Normal appearance/no adnexal mass.  Left ovary  Measurements: 4.6 x 2.4 x 4.4 cm. Normal appearance/no adnexal mass. 4.3 x 2.0 x 3.9 cm simple cyst left ovary. Similar finding noted on prior CT.  Other findings  No abnormal free fluid.  Gynecologic History No LMP recorded. Patient is postmenopausal. Contraception: post menopausal status Last Pap:10/17/2016 negative for intraepithelial lesions or malignancy. Benign reparative/reactive changes. ENDOMETRIAL CELLS PRESENT  Obstetric History OB History  Gravida Para Term Preterm AB Living  2 2 2     2   SAB TAB Ectopic Multiple Live Births          2    # Outcome  Date GA Lbr Len/2nd Weight Sex Delivery Anes PTL Lv  2 Term 1972   9 lb (4.082 kg) F Vag-Spont   LIV  1 Term 1970   9 lb (4.082 kg) F Vag-Spont   LIV      Past Medical History:  Diagnosis Date  . Allergy   . Arthritis   . Depression   . Diabetes mellitus without complication ([place])   . GERD (gastroesophageal reflux disease)   . Hyperlipidemia   . Hypertension   . Urinary incontinence   . Varicose veins     Past Surgical History:  Procedure Laterality Date  . CHOLECYSTECTOMY    . COLONOSCOPY WITH PROPOFOL N/A 06/14/2016   Procedure: COLONOSCOPY WITH PROPOFOL;  Surgeon: Lollie Sails, MD;  Location: [facility] ENDOSCOPY;  Service: Endoscopy;  Laterality: N/A;  . ESOPHAGOGASTRODUODENOSCOPY (EGD) WITH PROPOFOL N/A 06/14/2016   Procedure: ESOPHAGOGASTRODUODENOSCOPY (EGD) WITH PROPOFOL;  Surgeon: Lollie Sails, MD;  Location: [facility] ENDOSCOPY;  Service: Endoscopy;  Laterality: N/A;  . JOINT REPLACEMENT Right 2007  . JOINT REPLACEMENT Left 2004  . ROTATOR CUFF REPAIR Right   . UPPER GI ENDOSCOPY     x2    Current Outpatient Prescriptions on File Prior to Visit  Medication Sig Dispense Refill  . ACCU-CHEK AVIVA PLUS test strip Test once daily 100 each 2  . albuterol (PROVENTIL HFA;VENTOLIN HFA) 108 (90 BASE) MCG/ACT inhaler Inhale 2 puffs into the lungs every 6 (six) hours as needed for wheezing.    . Ascorbic Acid (VITAMIN C) 1000 MG tablet Take 1,000 mg by mouth daily.     Marland Kitchen aspirin EC 81 MG tablet Take 81 mg by mouth every evening.     Marland Kitchen  atorvastatin (LIPITOR) 40 MG tablet TAKE 1 TABLET BY MOUTH  DAILY AT 6 PM. (Patient taking differently: TAKE 20 mg BY MOUTH  DAILY AT 6 PM.) 90 tablet 2  . azelastine (ASTEPRO) 137 MCG/SPRAY nasal spray Place 1 spray into the nose 2 (two) times daily. Use in each nostril as directed (Patient taking differently: Place 1 spray into the nose daily. Use in each nostril as directed) 30 mL 11  . BIOTIN PO Take 1 tablet by mouth 2 (two) times daily. Take  1000mg  by mouth twice daily.     . Cholecalciferol (D3-1000 PO) Take 1,000 Int'l Units by mouth every morning.     Marland Kitchen CINNAMON PO Take 1,000 mg by mouth 2 (two) times daily.    . fish oil-omega-3 fatty acids 1000 MG capsule Take 1 capsule by mouth daily.    . fluticasone (FLONASE) 50 MCG/ACT nasal spray Place 1 spray into both nostrils daily. 48 g 1  . furosemide (LASIX) 20 MG tablet Take 1 tablet by mouth  every other day 45 tablet 2  . glipiZIDE (GLUCOTROL XL) 10 MG 24 hr tablet Take 1 tablet by mouth  daily 90 tablet 2  . Lancet Devices (ACCU-CHEK SOFTCLIX) lancets Use to test blood sugars 1 -2 times daily 1 each 3  . loratadine (CLARITIN) 10 MG tablet Take 10 mg by mouth daily.    Marland Kitchen losartan-hydrochlorothiazide (HYZAAR) 50-12.5 MG tablet Take 1 tablet by mouth daily. 90 tablet 0  . Meclizine HCl 25 MG CHEW Chew 25 mg by mouth daily as needed (for vertigo).     . metFORMIN (GLUCOPHAGE-XR) 750 MG 24 hr tablet Take 2 tablets by mouth  daily with breakfast 180 tablet 2  . Multiple Vitamin (MULTIVITAMIN) tablet Take 1 tablet by mouth daily.    Marland Kitchen omeprazole (PRILOSEC) 20 MG capsule Take 1 capsule by mouth  daily 90 capsule 2  . Potassium 99 MG TABS Take 1 tablet by mouth daily.    . Probiotic Product (PROBIOTIC-10) CAPS Take 1 capsule by mouth daily.     Marland Kitchen senna-docusate (SENOKOT-S) 8.6-50 MG per tablet Take 1 tablet by mouth daily. 30 tablet 0  . sertraline (ZOLOFT) 50 MG tablet Take 1 tablet by mouth  daily 90 tablet 2  . sitaGLIPtin (JANUVIA) 50 MG tablet One tablet daily    . verapamil (VERELAN PM) 180 MG 24 hr capsule Take 1 capsule by mouth  daily 90 capsule 2   No current facility-administered medications on file prior to visit.     Allergies  Allergen Reactions  . Clindamycin/Lincomycin Rash  . Codeine Rash  . Morphine And Related Nausea Only  . Talwin [Pentazocine] Nausea And Vomiting  . Lisinopril Cough    cough  . Oxycodone-Acetaminophen Other (See Comments)    Pt states this  medication makes her bowels stop moving  . Cleocin [Clindamycin Hcl] Rash    Social History   Social History  . Marital status: Married    Spouse name: N/A  . Number of children: N/A  . Years of education: N/A   Occupational History  . Not on file.   Social History Main Topics  . Smoking status: Never Smoker  . Smokeless tobacco: Never Used  . Alcohol use No  . Drug use: No  . Sexual activity: No   Other Topics Concern  . Not on file   Social History Narrative  . No narrative on file    Family History  Problem Relation Age of Onset  .  Stroke Mother   . Hypertension Mother   . Lupus Mother   . Alcohol abuse Father   . Diabetes Father   . Cancer Father     Liver Cancer  . Arthritis Sister   . Diabetes Sister   . Hyperlipidemia Daughter   . Hypertension Daughter   . Mental retardation Daughter   . Cancer Maternal Aunt     ovarian ca  . Diabetes Paternal Aunt   . Drug abuse Paternal Aunt   . Cancer Maternal Grandfather     colon ca    The following portions of the patient's history were reviewed and updated as appropriate: allergies, current medications, past family history, past medical history, past social history, past surgical history and problem list.  Review of Systems Review of Systems - General ROS: negative for - chills, fatigue, fever, hot flashes, malaise or night sweats Hematological and Lymphatic ROS: negative for - bleeding problems or swollen lymph nodes Gastrointestinal ROS: negative for - abdominal pain, blood in stools, change in bowel habits and nausea/vomiting Musculoskeletal ROS: negative for - joint pain, muscle pain or muscular weakness Genito-Urinary ROS: negative for - change in menstrual cycle, dysmenorrhea, dyspareunia, dysuria, genital discharge, genital ulcers, hematuria, incontinence, irregular/heavy menses, nocturia or pelvic painjj  Objective:   BP 102/66   Pulse 69   Wt 181 lb 1.6 oz (82.1 kg)   BMI 34.22 kg/m    CONSTITUTIONAL: Well-developed, well-nourished female in no acute distress.  HENT:  Normocephalic, atraumatic.  NECK: Normal range of motion, supple, no masses.  Normal thyroid.  SKIN: Skin is warm and dry. No rash noted. Not diaphoretic. No erythema. No pallor. [place]: Alert and oriented to person, place, and time. PSYCHIATRIC: Normal mood and affect. Normal behavior. Normal judgment and thought content. CARDIOVASCULAR: Regular rate and rhythm without murmur RESPIRATORY: Clear BREASTS: Not Examined ABDOMEN: Soft, non distended; Non tender.  No Organomegaly. PELVIC: (11/16/2016)             External Genitalia: Normal             BUS: Normal             Vagina: mild to moderate atrophy             Cervix: Normal; no lesions; no cervical motion tenderness             Uterus: top normal size; normal shape,consistency, mobile, nontender             Adnexa: Normal; nonpalpable nontender             RV: Normal external exam             Bladder: Nontender MUSCULOSKELETAL: Normal range of motion. No tenderness.  No cyanosis, clubbing, or edema.     Assessment:   1. Preoperative examination  2. Post-menopausal bleeding  3. Uterine leiomyoma, unspecified location  4. Cervical stenosis (uterine cervix)  5. Atypical glandular cells on Pap smear/endometrial biopsy      Plan:   Hysteroscopy/D&C  Prep calcium: Patient is to undergo hysteroscopy/D&C for evaluation of postmenopausal bleeding and atypical glandular cells found on endometrial biopsy. Patient is understanding of the planned procedure and is aware of and is accepting of all surgical risks which include but are not limited to bleeding, infection, pelvic organ injury with need for repair, blood clot disorders, anesthesia risks, etc. All questions have been answered. Informed consent is given. Patient is ready and willing to proceed with surgery as scheduled.  Hassell Done  A Defrancesco, MD  Note: This dictation was prepared with  Dragon dictation along with smaller phrase technology. Any transcriptional errors that result from this process are unintentional.  GYN ENCOUNTER NOTE  Subjective:  PREOPERATIVE HISTORY AND PHYSICAL    Date of surgery: 01/02/2017 Procedure: Hysteroscopy/D&C Diagnosis: 1. Postmenopausal bleeding 2. Cervical stenosis 3. Uterine fibroids 4. Atypical glandular cells on endometrial biopsy     Sandra Hendrix is a 71 y.o. G98P2002 female is here for gynecologic evaluation of the following issues:  1. Postmenopausal bleeding 2. Atypical glandular cells on endometrial biopsy  Patient had episode of postmenopausal bleeding. Pap smear showed endometrial cells present. Endometrial biopsy absolutely performed was abnormal  Endometrial biopsy pathology: ENDOMETRIUM, BIOPSY:  SCANT AND MINUTE FRAGMENTS OF ATYPICAL ENDOMETRIUM WITH GLANDULAR  CROWDING. BACKGROUND STRIPS OF ATROPHIC ENDOMETRIAL EPITHELIUM  ARE ALSO PRESENT (SEE NOTE).  NOTE: Additional sampling is recommended to rule out hyperplasia  and carcinoma.    10/17/2016 ultrasound findings FINDINGS: Uterus  Measurements: 7.8 x 4.8 x 6.2 cm. Large 5.8 x 4.3 x 5.3 cm mass is noted within the a uterine body. Echodensities consistent calcifications noted within the mass. Finding most consistent with a fibroid.  Endometrium  Thickness: Could not identified due to large uterine mass.  Right ovary  Measurements: 2.6 x 1.4 x 1.4 cm. Normal appearance/no adnexal mass.  Left ovary  Measurements: 4.6 x 2.4 x 4.4 cm. Normal appearance/no adnexal mass. 4.3 x 2.0 x 3.9 cm simple cyst left ovary. Similar finding noted on prior CT.  Other findings  No abnormal free fluid.  Gynecologic History No LMP recorded. Patient is postmenopausal. Contraception: post menopausal status Last Pap:10/17/2016 negative for intraepithelial lesions or malignancy. Benign reparative/reactive changes. ENDOMETRIAL CELLS PRESENT  Obstetric  History OB History  Gravida Para Term Preterm AB Living  2 2 2     2   SAB TAB Ectopic Multiple Live Births          2    # Outcome Date GA Lbr Len/2nd Weight Sex Delivery Anes PTL Lv  2 Term 1972   9 lb (4.082 kg) F Vag-Spont   LIV  1 Term 1970   9 lb (4.082 kg) F Vag-Spont   LIV      Past Medical History:  Diagnosis Date  . Allergy   . Arthritis   . Depression   . Diabetes mellitus without complication ([place])   . GERD (gastroesophageal reflux disease)   . Hyperlipidemia   . Hypertension   . Urinary incontinence   . Varicose veins     Past Surgical History:  Procedure Laterality Date  . CHOLECYSTECTOMY    . COLONOSCOPY WITH PROPOFOL N/A 06/14/2016   Procedure: COLONOSCOPY WITH PROPOFOL;  Surgeon: Lollie Sails, MD;  Location: [facility] ENDOSCOPY;  Service: Endoscopy;  Laterality: N/A;  . ESOPHAGOGASTRODUODENOSCOPY (EGD) WITH PROPOFOL N/A 06/14/2016   Procedure: ESOPHAGOGASTRODUODENOSCOPY (EGD) WITH PROPOFOL;  Surgeon: Lollie Sails, MD;  Location: [facility] ENDOSCOPY;  Service: Endoscopy;  Laterality: N/A;  . JOINT REPLACEMENT Right 2007  . JOINT REPLACEMENT Left 2004  . ROTATOR CUFF REPAIR Right   . UPPER GI ENDOSCOPY     x2    Current Outpatient Prescriptions on File Prior to Visit  Medication Sig Dispense Refill  . ACCU-CHEK AVIVA PLUS test strip Test once daily 100 each 2  . albuterol (PROVENTIL HFA;VENTOLIN HFA) 108 (90 BASE) MCG/ACT inhaler Inhale 2 puffs into the lungs every 6 (six) hours as needed for wheezing.    . Ascorbic  Acid (VITAMIN C) 1000 MG tablet Take 1,000 mg by mouth daily.     Marland Kitchen aspirin EC 81 MG tablet Take 81 mg by mouth every evening.     Marland Kitchen atorvastatin (LIPITOR) 40 MG tablet TAKE 1 TABLET BY MOUTH  DAILY AT 6 PM. (Patient taking differently: TAKE 20 mg BY MOUTH  DAILY AT 6 PM.) 90 tablet 2  . azelastine (ASTEPRO) 137 MCG/SPRAY nasal spray Place 1 spray into the nose 2 (two) times daily. Use in each nostril as directed (Patient taking differently:  Place 1 spray into the nose daily. Use in each nostril as directed) 30 mL 11  . BIOTIN PO Take 1 tablet by mouth 2 (two) times daily. Take 1000mg  by mouth twice daily.     . Cholecalciferol (D3-1000 PO) Take 1,000 Int'l Units by mouth every morning.     Marland Kitchen CINNAMON PO Take 1,000 mg by mouth 2 (two) times daily.    . fish oil-omega-3 fatty acids 1000 MG capsule Take 1 capsule by mouth daily.    . fluticasone (FLONASE) 50 MCG/ACT nasal spray Place 1 spray into both nostrils daily. 48 g 1  . furosemide (LASIX) 20 MG tablet Take 1 tablet by mouth  every other day 45 tablet 2  . glipiZIDE (GLUCOTROL XL) 10 MG 24 hr tablet Take 1 tablet by mouth  daily 90 tablet 2  . Lancet Devices (ACCU-CHEK SOFTCLIX) lancets Use to test blood sugars 1 -2 times daily 1 each 3  . loratadine (CLARITIN) 10 MG tablet Take 10 mg by mouth daily.    Marland Kitchen losartan-hydrochlorothiazide (HYZAAR) 50-12.5 MG tablet Take 1 tablet by mouth daily. 90 tablet 0  . Meclizine HCl 25 MG CHEW Chew 25 mg by mouth daily as needed (for vertigo).     . metFORMIN (GLUCOPHAGE-XR) 750 MG 24 hr tablet Take 2 tablets by mouth  daily with breakfast 180 tablet 2  . Multiple Vitamin (MULTIVITAMIN) tablet Take 1 tablet by mouth daily.    Marland Kitchen omeprazole (PRILOSEC) 20 MG capsule Take 1 capsule by mouth  daily 90 capsule 2  . Potassium 99 MG TABS Take 1 tablet by mouth daily.    . Probiotic Product (PROBIOTIC-10) CAPS Take 1 capsule by mouth daily.     Marland Kitchen senna-docusate (SENOKOT-S) 8.6-50 MG per tablet Take 1 tablet by mouth daily. 30 tablet 0  . sertraline (ZOLOFT) 50 MG tablet Take 1 tablet by mouth  daily 90 tablet 2  . sitaGLIPtin (JANUVIA) 50 MG tablet One tablet daily    . verapamil (VERELAN PM) 180 MG 24 hr capsule Take 1 capsule by mouth  daily 90 capsule 2   No current facility-administered medications on file prior to visit.     Allergies  Allergen Reactions  . Clindamycin/Lincomycin Rash  . Codeine Rash  . Morphine And Related Nausea Only  .  Talwin [Pentazocine] Nausea And Vomiting  . Lisinopril Cough    cough  . Oxycodone-Acetaminophen Other (See Comments)    Pt states this medication makes her bowels stop moving  . Cleocin [Clindamycin Hcl] Rash    Social History   Social History  . Marital status: Married    Spouse name: N/A  . Number of children: N/A  . Years of education: N/A   Occupational History  . Not on file.   Social History Main Topics  . Smoking status: Never Smoker  . Smokeless tobacco: Never Used  . Alcohol use No  . Drug use: No  . Sexual activity:  No   Other Topics Concern  . Not on file   Social History Narrative  . No narrative on file    Family History  Problem Relation Age of Onset  . Stroke Mother   . Hypertension Mother   . Lupus Mother   . Alcohol abuse Father   . Diabetes Father   . Cancer Father     Liver Cancer  . Arthritis Sister   . Diabetes Sister   . Hyperlipidemia Daughter   . Hypertension Daughter   . Mental retardation Daughter   . Cancer Maternal Aunt     ovarian ca  . Diabetes Paternal Aunt   . Drug abuse Paternal Aunt   . Cancer Maternal Grandfather     colon ca    The following portions of the patient's history were reviewed and updated as appropriate: allergies, current medications, past family history, past medical history, past social history, past surgical history and problem list.  Review of Systems Review of Systems - General ROS: negative for - chills, fatigue, fever, hot flashes, malaise or night sweats Hematological and Lymphatic ROS: negative for - bleeding problems or swollen lymph nodes Gastrointestinal ROS: negative for - abdominal pain, blood in stools, change in bowel habits and nausea/vomiting Musculoskeletal ROS: negative for - joint pain, muscle pain or muscular weakness Genito-Urinary ROS: negative for - change in menstrual cycle, dysmenorrhea, dyspareunia, dysuria, genital discharge, genital ulcers, hematuria, incontinence,  irregular/heavy menses, nocturia or pelvic painjj  Objective:   BP 102/66   Pulse 69   Wt 181 lb 1.6 oz (82.1 kg)   BMI 34.22 kg/m  CONSTITUTIONAL: Well-developed, well-nourished female in no acute distress.  HENT:  Normocephalic, atraumatic.  NECK: Normal range of motion, supple, no masses.  Normal thyroid.  SKIN: Skin is warm and dry. No rash noted. Not diaphoretic. No erythema. No pallor. [place]: Alert and oriented to person, place, and time. PSYCHIATRIC: Normal mood and affect. Normal behavior. Normal judgment and thought content. CARDIOVASCULAR: Regular rate and rhythm without murmur RESPIRATORY: Clear BREASTS: Not Examined ABDOMEN: Soft, non distended; Non tender.  No Organomegaly. PELVIC: (11/16/2016)             External Genitalia: Normal             BUS: Normal             Vagina: mild to moderate atrophy             Cervix: Normal; no lesions; no cervical motion tenderness             Uterus: top normal size; normal shape,consistency, mobile, nontender             Adnexa: Normal; nonpalpable nontender             RV: Normal external exam             Bladder: Nontender MUSCULOSKELETAL: Normal range of motion. No tenderness.  No cyanosis, clubbing, or edema.     Assessment:   1. Preoperative examination  2. Post-menopausal bleeding  3. Uterine leiomyoma, unspecified location  4. Cervical stenosis (uterine cervix)  5. Atypical glandular cells on Pap smear/endometrial biopsy      Plan:   Hysteroscopy/D&C  Prep calcium: Patient is to undergo hysteroscopy/D&C for evaluation of postmenopausal bleeding and atypical glandular cells found on endometrial biopsy. Patient is understanding of the planned procedure and is aware of and is accepting of all surgical risks which include but are not limited to bleeding, infection, pelvic  organ injury with need for repair, blood clot disorders, anesthesia risks, etc. All questions have been answered. Informed consent is  given. Patient is ready and willing to proceed with surgery as scheduled.  Brayton Mars, MD  Note: This dictation was prepared with Dragon dictation along with smaller phrase technology. Any transcriptional errors that result from this process are unintentional.  GYN ENCOUNTER NOTE  Subjective:  PREOPERATIVE HISTORY AND PHYSICAL    Date of surgery: 01/02/2017 Procedure: Hysteroscopy/D&C Diagnosis: 1. Postmenopausal bleeding 2. Cervical stenosis 3. Uterine fibroids 4. Atypical glandular cells on endometrial biopsy     Sandra Hendrix is a 71 y.o. G22P2002 female is here for gynecologic evaluation of the following issues:  1. Postmenopausal bleeding 2. Atypical glandular cells on endometrial biopsy  Patient had episode of postmenopausal bleeding. Pap smear showed endometrial cells present. Endometrial biopsy absolutely performed was abnormal  Endometrial biopsy pathology: ENDOMETRIUM, BIOPSY:  SCANT AND MINUTE FRAGMENTS OF ATYPICAL ENDOMETRIUM WITH GLANDULAR  CROWDING. BACKGROUND STRIPS OF ATROPHIC ENDOMETRIAL EPITHELIUM  ARE ALSO PRESENT (SEE NOTE).  NOTE: Additional sampling is recommended to rule out hyperplasia  and carcinoma.    10/17/2016 ultrasound findings FINDINGS: Uterus  Measurements: 7.8 x 4.8 x 6.2 cm. Large 5.8 x 4.3 x 5.3 cm mass is noted within the a uterine body. Echodensities consistent calcifications noted within the mass. Finding most consistent with a fibroid.  Endometrium  Thickness: Could not identified due to large uterine mass.  Right ovary  Measurements: 2.6 x 1.4 x 1.4 cm. Normal appearance/no adnexal mass.  Left ovary  Measurements: 4.6 x 2.4 x 4.4 cm. Normal appearance/no adnexal mass. 4.3 x 2.0 x 3.9 cm simple cyst left ovary. Similar finding noted on prior CT.  Other findings  No abnormal free fluid.  Gynecologic History No LMP recorded. Patient is postmenopausal. Contraception: post menopausal status Last  Pap:10/17/2016 negative for intraepithelial lesions or malignancy. Benign reparative/reactive changes. ENDOMETRIAL CELLS PRESENT  Obstetric History OB History  Gravida Para Term Preterm AB Living  2 2 2     2   SAB TAB Ectopic Multiple Live Births          2    # Outcome Date GA Lbr Len/2nd Weight Sex Delivery Anes PTL Lv  2 Term 1972   9 lb (4.082 kg) F Vag-Spont   LIV  1 Term 1970   9 lb (4.082 kg) F Vag-Spont   LIV      Past Medical History:  Diagnosis Date  . Allergy   . Arthritis   . Depression   . Diabetes mellitus without complication ([place])   . GERD (gastroesophageal reflux disease)   . Hyperlipidemia   . Hypertension   . Urinary incontinence   . Varicose veins     Past Surgical History:  Procedure Laterality Date  . CHOLECYSTECTOMY    . COLONOSCOPY WITH PROPOFOL N/A 06/14/2016   Procedure: COLONOSCOPY WITH PROPOFOL;  Surgeon: Lollie Sails, MD;  Location: [facility] ENDOSCOPY;  Service: Endoscopy;  Laterality: N/A;  . ESOPHAGOGASTRODUODENOSCOPY (EGD) WITH PROPOFOL N/A 06/14/2016   Procedure: ESOPHAGOGASTRODUODENOSCOPY (EGD) WITH PROPOFOL;  Surgeon: Lollie Sails, MD;  Location: [facility] ENDOSCOPY;  Service: Endoscopy;  Laterality: N/A;  . JOINT REPLACEMENT Right 2007  . JOINT REPLACEMENT Left 2004  . ROTATOR CUFF REPAIR Right   . UPPER GI ENDOSCOPY     x2    Current Outpatient Prescriptions on File Prior to Visit  Medication Sig Dispense Refill  . ACCU-CHEK AVIVA PLUS test strip Test once  daily 100 each 2  . albuterol (PROVENTIL HFA;VENTOLIN HFA) 108 (90 BASE) MCG/ACT inhaler Inhale 2 puffs into the lungs every 6 (six) hours as needed for wheezing.    . Ascorbic Acid (VITAMIN C) 1000 MG tablet Take 1,000 mg by mouth daily.     Marland Kitchen aspirin EC 81 MG tablet Take 81 mg by mouth every evening.     Marland Kitchen atorvastatin (LIPITOR) 40 MG tablet TAKE 1 TABLET BY MOUTH  DAILY AT 6 PM. (Patient taking differently: TAKE 20 mg BY MOUTH  DAILY AT 6 PM.) 90 tablet 2  . azelastine  (ASTEPRO) 137 MCG/SPRAY nasal spray Place 1 spray into the nose 2 (two) times daily. Use in each nostril as directed (Patient taking differently: Place 1 spray into the nose daily. Use in each nostril as directed) 30 mL 11  . BIOTIN PO Take 1 tablet by mouth 2 (two) times daily. Take 1000mg  by mouth twice daily.     . Cholecalciferol (D3-1000 PO) Take 1,000 Int'l Units by mouth every morning.     Marland Kitchen CINNAMON PO Take 1,000 mg by mouth 2 (two) times daily.    . fish oil-omega-3 fatty acids 1000 MG capsule Take 1 capsule by mouth daily.    . fluticasone (FLONASE) 50 MCG/ACT nasal spray Place 1 spray into both nostrils daily. 48 g 1  . furosemide (LASIX) 20 MG tablet Take 1 tablet by mouth  every other day 45 tablet 2  . glipiZIDE (GLUCOTROL XL) 10 MG 24 hr tablet Take 1 tablet by mouth  daily 90 tablet 2  . Lancet Devices (ACCU-CHEK SOFTCLIX) lancets Use to test blood sugars 1 -2 times daily 1 each 3  . loratadine (CLARITIN) 10 MG tablet Take 10 mg by mouth daily.    Marland Kitchen losartan-hydrochlorothiazide (HYZAAR) 50-12.5 MG tablet Take 1 tablet by mouth daily. 90 tablet 0  . Meclizine HCl 25 MG CHEW Chew 25 mg by mouth daily as needed (for vertigo).     . metFORMIN (GLUCOPHAGE-XR) 750 MG 24 hr tablet Take 2 tablets by mouth  daily with breakfast 180 tablet 2  . Multiple Vitamin (MULTIVITAMIN) tablet Take 1 tablet by mouth daily.    Marland Kitchen omeprazole (PRILOSEC) 20 MG capsule Take 1 capsule by mouth  daily 90 capsule 2  . Potassium 99 MG TABS Take 1 tablet by mouth daily.    . Probiotic Product (PROBIOTIC-10) CAPS Take 1 capsule by mouth daily.     Marland Kitchen senna-docusate (SENOKOT-S) 8.6-50 MG per tablet Take 1 tablet by mouth daily. 30 tablet 0  . sertraline (ZOLOFT) 50 MG tablet Take 1 tablet by mouth  daily 90 tablet 2  . sitaGLIPtin (JANUVIA) 50 MG tablet One tablet daily    . verapamil (VERELAN PM) 180 MG 24 hr capsule Take 1 capsule by mouth  daily 90 capsule 2   No current facility-administered medications on  file prior to visit.     Allergies  Allergen Reactions  . Clindamycin/Lincomycin Rash  . Codeine Rash  . Morphine And Related Nausea Only  . Talwin [Pentazocine] Nausea And Vomiting  . Lisinopril Cough    cough  . Oxycodone-Acetaminophen Other (See Comments)    Pt states this medication makes her bowels stop moving  . Cleocin [Clindamycin Hcl] Rash    Social History   Social History  . Marital status: Married    Spouse name: N/A  . Number of children: N/A  . Years of education: N/A   Occupational History  . Not  on file.   Social History Main Topics  . Smoking status: Never Smoker  . Smokeless tobacco: Never Used  . Alcohol use No  . Drug use: No  . Sexual activity: No   Other Topics Concern  . Not on file   Social History Narrative  . No narrative on file    Family History  Problem Relation Age of Onset  . Stroke Mother   . Hypertension Mother   . Lupus Mother   . Alcohol abuse Father   . Diabetes Father   . Cancer Father     Liver Cancer  . Arthritis Sister   . Diabetes Sister   . Hyperlipidemia Daughter   . Hypertension Daughter   . Mental retardation Daughter   . Cancer Maternal Aunt     ovarian ca  . Diabetes Paternal Aunt   . Drug abuse Paternal Aunt   . Cancer Maternal Grandfather     colon ca    The following portions of the patient's history were reviewed and updated as appropriate: allergies, current medications, past family history, past medical history, past social history, past surgical history and problem list.  Review of Systems Review of Systems - General ROS: negative for - chills, fatigue, fever, hot flashes, malaise or night sweats Hematological and Lymphatic ROS: negative for - bleeding problems or swollen lymph nodes Gastrointestinal ROS: negative for - abdominal pain, blood in stools, change in bowel habits and nausea/vomiting Musculoskeletal ROS: negative for - joint pain, muscle pain or muscular weakness Genito-Urinary ROS:  negative for - change in menstrual cycle, dysmenorrhea, dyspareunia, dysuria, genital discharge, genital ulcers, hematuria, incontinence, irregular/heavy menses, nocturia or pelvic painjj  Objective:   BP 102/66   Pulse 69   Wt 181 lb 1.6 oz (82.1 kg)   BMI 34.22 kg/m  CONSTITUTIONAL: Well-developed, well-nourished female in no acute distress.  HENT:  Normocephalic, atraumatic.  NECK: Normal range of motion, supple, no masses.  Normal thyroid.  SKIN: Skin is warm and dry. No rash noted. Not diaphoretic. No erythema. No pallor. [place]: Alert and oriented to person, place, and time. PSYCHIATRIC: Normal mood and affect. Normal behavior. Normal judgment and thought content. CARDIOVASCULAR: Regular rate and rhythm without murmur RESPIRATORY: Clear BREASTS: Not Examined ABDOMEN: Soft, non distended; Non tender.  No Organomegaly. PELVIC: (11/16/2016)             External Genitalia: Normal             BUS: Normal             Vagina: mild to moderate atrophy             Cervix: Normal; no lesions; no cervical motion tenderness             Uterus: top normal size; normal shape,consistency, mobile, nontender             Adnexa: Normal; nonpalpable nontender             RV: Normal external exam             Bladder: Nontender MUSCULOSKELETAL: Normal range of motion. No tenderness.  No cyanosis, clubbing, or edema.     Assessment:   1. Preoperative examination  2. Post-menopausal bleeding  3. Uterine leiomyoma, unspecified location  4. Cervical stenosis (uterine cervix)  5. Atypical glandular cells on Pap smear/endometrial biopsy      Plan:   Hysteroscopy/D&C  Prep calcium: Patient is to undergo hysteroscopy/D&C for evaluation of postmenopausal bleeding and atypical  glandular cells found on endometrial biopsy. Patient is understanding of the planned procedure and is aware of and is accepting of all surgical risks which include but are not limited to bleeding, infection, pelvic  organ injury with need for repair, blood clot disorders, anesthesia risks, etc. All questions have been answered. Informed consent is given. Patient is ready and willing to proceed with surgery as scheduled.  Brayton Mars, MD  Note: This dictation was prepared with Dragon dictation along with smaller phrase technology. Any transcriptional errors that result from this process are unintentional.  Progress Notes Unsigned  Encounter Date: 12/28/2016 Brayton Mars, MD  Obstetrics and Gynecology    [] Hide copied text GYN ENCOUNTER NOTE  Subjective:  PREOPERATIVE HISTORY AND PHYSICAL    Date of surgery: 01/02/2017 Procedure: Hysteroscopy/D&C Diagnosis: 1. Postmenopausal bleeding 2. Cervical stenosis 3. Uterine fibroids 4. Atypical glandular cells on endometrial biopsy                           Sandra Hendrix is a 71 y.o. G69P2002 female is here for gynecologic evaluation of the following issues:  1. Postmenopausal bleeding 2. Atypical glandular cells on endometrial biopsy  Patient had episode of postmenopausal bleeding. Pap smear showed endometrial cells present. Endometrial biopsy absolutely performed was abnormal  Endometrial biopsy pathology: ENDOMETRIUM, BIOPSY:  SCANT ANDMINUTE FRAGMENTS OF ATYPICAL ENDOMETRIUM WITH GLANDULAR  CROWDING.BACKGROUND STRIPS OF ATROPHIC ENDOMETRIAL EPITHELIUM  ARE ALSO PRESENT (SEE NOTE).  NOTE: Additional sampling is recommended to rule out hyperplasia  and carcinoma.    10/17/2016 ultrasound findings FINDINGS: Uterus  Measurements: 7.8 x 4.8 x 6.2 cm. Large 5.8 x 4.3 x 5.3 cm mass is noted within the a uterine body. Echodensities consistent calcifications noted within the mass. Finding most consistent with a fibroid.  Endometrium  Thickness: Could not identified due to large uterine mass.  Right ovary  Measurements: 2.6 x 1.4 x 1.4 cm. Normal appearance/no adnexal mass.  Left ovary  Measurements: 4.6  x 2.4 x 4.4 cm. Normal appearance/no adnexal mass. 4.3 x 2.0 x 3.9 cm simple cyst left ovary. Similar finding noted on prior CT.  Other findings  No abnormal free fluid.  Gynecologic History No LMP recorded. Patient is postmenopausal. Contraception: post menopausal status Last Pap:10/17/2016 negative for intraepithelial lesions or malignancy. Benign reparative/reactive changes. ENDOMETRIAL CELLS PRESENT  Obstetric History                 OB History  Gravida Para Term Preterm AB Living  2 2 2     2   SAB TAB Ectopic Multiple Live Births          2    # Outcome Date GA Lbr Len/2nd Weight Sex Delivery Anes PTL Lv  2 Term 1972   9 lb (4.082 kg) F Vag-Spont   LIV  1 Term 1970   9 lb (4.082 kg) F Vag-Spont   LIV          Past Medical History:  Diagnosis Date  . Allergy   . Arthritis   . Depression   . Diabetes mellitus without complication ([place])   . GERD (gastroesophageal reflux disease)   . Hyperlipidemia   . Hypertension   . Urinary incontinence   . Varicose veins          Past Surgical History:  Procedure Laterality Date  . CHOLECYSTECTOMY    . COLONOSCOPY WITH PROPOFOL N/A 06/14/2016   Procedure: COLONOSCOPY WITH PROPOFOL;  Surgeon: Lollie Sails, MD;  Location: [facility] ENDOSCOPY;  Service: Endoscopy;  Laterality: N/A;  . ESOPHAGOGASTRODUODENOSCOPY (EGD) WITH PROPOFOL N/A 06/14/2016   Procedure: ESOPHAGOGASTRODUODENOSCOPY (EGD) WITH PROPOFOL;  Surgeon: Lollie Sails, MD;  Location: [facility] ENDOSCOPY;  Service: Endoscopy;  Laterality: N/A;  . JOINT REPLACEMENT Right 2007  . JOINT REPLACEMENT Left 2004  . ROTATOR CUFF REPAIR Right   . UPPER GI ENDOSCOPY     x2          Current Outpatient Prescriptions on File Prior to Visit  Medication Sig Dispense Refill  . ACCU-CHEK AVIVA PLUS test strip Test once daily 100 each 2  . albuterol (PROVENTIL HFA;VENTOLIN HFA) 108 (90 BASE) MCG/ACT inhaler Inhale 2 puffs into the lungs every 6  (six) hours as needed for wheezing.    . Ascorbic Acid (VITAMIN C) 1000 MG tablet Take 1,000 mg by mouth daily.     Marland Kitchen aspirin EC 81 MG tablet Take 81 mg by mouth every evening.     Marland Kitchen atorvastatin (LIPITOR) 40 MG tablet TAKE 1 TABLET BY MOUTH  DAILY AT 6 PM. (Patient taking differently: TAKE 20 mg BY MOUTH  DAILY AT 6 PM.) 90 tablet 2  . azelastine (ASTEPRO) 137 MCG/SPRAY nasal spray Place 1 spray into the nose 2 (two) times daily. Use in each nostril as directed (Patient taking differently: Place 1 spray into the nose daily. Use in each nostril as directed) 30 mL 11  . BIOTIN PO Take 1 tablet by mouth 2 (two) times daily. Take 1000mg  by mouth twice daily.     . Cholecalciferol (D3-1000 PO) Take 1,000 Int'l Units by mouth every morning.     Marland Kitchen CINNAMON PO Take 1,000 mg by mouth 2 (two) times daily.    . fish oil-omega-3 fatty acids 1000 MG capsule Take 1 capsule by mouth daily.    . fluticasone (FLONASE) 50 MCG/ACT nasal spray Place 1 spray into both nostrils daily. 48 g 1  . furosemide (LASIX) 20 MG tablet Take 1 tablet by mouth  every other day 45 tablet 2  . glipiZIDE (GLUCOTROL XL) 10 MG 24 hr tablet Take 1 tablet by mouth  daily 90 tablet 2  . Lancet Devices (ACCU-CHEK SOFTCLIX) lancets Use to test blood sugars 1 -2 times daily 1 each 3  . loratadine (CLARITIN) 10 MG tablet Take 10 mg by mouth daily.    Marland Kitchen losartan-hydrochlorothiazide (HYZAAR) 50-12.5 MG tablet Take 1 tablet by mouth daily. 90 tablet 0  . Meclizine HCl 25 MG CHEW Chew 25 mg by mouth daily as needed (for vertigo).     . metFORMIN (GLUCOPHAGE-XR) 750 MG 24 hr tablet Take 2 tablets by mouth  daily with breakfast 180 tablet 2  . Multiple Vitamin (MULTIVITAMIN) tablet Take 1 tablet by mouth daily.    Marland Kitchen omeprazole (PRILOSEC) 20 MG capsule Take 1 capsule by mouth  daily 90 capsule 2  . Potassium 99 MG TABS Take 1 tablet by mouth daily.    . Probiotic Product (PROBIOTIC-10) CAPS Take 1 capsule by mouth daily.      Marland Kitchen senna-docusate (SENOKOT-S) 8.6-50 MG per tablet Take 1 tablet by mouth daily. 30 tablet 0  . sertraline (ZOLOFT) 50 MG tablet Take 1 tablet by mouth  daily 90 tablet 2  . sitaGLIPtin (JANUVIA) 50 MG tablet One tablet daily    . verapamil (VERELAN PM) 180 MG 24 hr capsule Take 1 capsule by mouth  daily 90 capsule 2   No current facility-administered medications  on file prior to visit.          Allergies  Allergen Reactions  . Clindamycin/Lincomycin Rash  . Codeine Rash  . Morphine And Related Nausea Only  . Talwin [Pentazocine] Nausea And Vomiting  . Lisinopril Cough    cough  . Oxycodone-Acetaminophen Other (See Comments)    Pt states this medication makes her bowels stop moving  . Cleocin [Clindamycin Hcl] Rash    Social History        Social History  . Marital status: Married    Spouse name: N/A  . Number of children: N/A  . Years of education: N/A      Occupational History  . Not on file.       Social History Main Topics  . Smoking status: Never Smoker  . Smokeless tobacco: Never Used  . Alcohol use No  . Drug use: No  . Sexual activity: No   Other Topics Concern  . Not on file      Social History Narrative  . No narrative on file          Family History  Problem Relation Age of Onset  . Stroke Mother   . Hypertension Mother   . Lupus Mother   . Alcohol abuse Father   . Diabetes Father   . Cancer Father     Liver Cancer  . Arthritis Sister   . Diabetes Sister   . Hyperlipidemia Daughter   . Hypertension Daughter   . Mental retardation Daughter   . Cancer Maternal Aunt     ovarian ca  . Diabetes Paternal Aunt   . Drug abuse Paternal Aunt   . Cancer Maternal Grandfather     colon ca    The following portions of the patient's history were reviewed and updated as appropriate: allergies, current medications, past family history, past medical history, past social history, past surgical history and  problem list.  Review of Systems Review of Systems - General ROS: negative for - chills, fatigue, fever, hot flashes, malaise or night sweats Hematological and Lymphatic ROS: negative for - bleeding problems or swollen lymph nodes Gastrointestinal ROS: negative for - abdominal pain, blood in stools, change in bowel habits and nausea/vomiting Musculoskeletal ROS: negative for - joint pain, muscle pain or muscular weakness Genito-Urinary ROS: negative for - change in menstrual cycle, dysmenorrhea, dyspareunia, dysuria, genital discharge, genital ulcers, hematuria, incontinence, irregular/heavy menses, nocturia or pelvic painjj  Objective:   BP 102/66   Pulse 69   Wt 181 lb 1.6 oz (82.1 kg)   BMI 34.22 kg/m  CONSTITUTIONAL: Well-developed, well-nourished female in no acute distress.  HENT:  Normocephalic, atraumatic.  NECK: Normal range of motion, supple, no masses.  Normal thyroid.  SKIN: Skin is warm and dry. No rash noted. Not diaphoretic. No erythema. No pallor. [place]: Alert and oriented to person, place, and time. PSYCHIATRIC: Normal mood and affect. Normal behavior. Normal judgment and thought content. CARDIOVASCULAR: Regular rate and rhythm without murmur RESPIRATORY: Clear BREASTS: Not Examined ABDOMEN: Soft, non distended; Non tender.  No Organomegaly. PELVIC: (11/16/2016) External Genitalia: Normal BUS: Normal Vagina: mild to moderate atrophy Cervix: Normal; no lesions; no cervical motion tenderness Uterus: top normal size; normal shape,consistency, mobile, nontender Adnexa: Normal; nonpalpable nontender RV: Normal external exam Bladder: Nontender MUSCULOSKELETAL: Normal range of motion. No tenderness.  No cyanosis, clubbing, or edema.     Assessment:   1. Preoperative examination  2. Post-menopausal bleeding  3. Uterine leiomyoma, unspecified  location  4.  Cervical stenosis (uterine cervix)  5. Atypical glandular cells on Pap smear/endometrial biopsy      Plan:   Hysteroscopy/D&C  Prep calcium: Patient is to undergo hysteroscopy/D&C for evaluation of postmenopausal bleeding and atypical glandular cells found on endometrial biopsy. Patient is understanding of the planned procedure and is aware of and is accepting of all surgical risks which include but are not limited to bleeding, infection, pelvic organ injury with need for repair, blood clot disorders, anesthesia risks, etc. All questions have been answered. Informed consent is given. Patient is ready and willing to proceed with surgery as scheduled.  Brayton Mars, MD  Note: This dictation was prepared with Dragon dictation along with smaller phrase technology. Any transcriptional errors that result from this process are unintentional.          Office Visit on 12/28/2016        Detailed Report

## 2016-12-29 ENCOUNTER — Encounter
Admission: RE | Admit: 2016-12-29 | Discharge: 2016-12-29 | Disposition: A | Discharge status: Home / Self Care | Payer: Medicare Other | Source: Ambulatory Visit | Attending: Obstetrics and Gynecology | Admitting: Obstetrics and Gynecology

## 2016-12-29 DIAGNOSIS — E669 Obesity, unspecified: Secondary | ICD-10-CM | POA: Insufficient documentation

## 2016-12-29 DIAGNOSIS — Z01810 Encounter for preprocedural cardiovascular examination: Secondary | ICD-10-CM | POA: Insufficient documentation

## 2016-12-29 DIAGNOSIS — I872 Venous insufficiency (chronic) (peripheral): Secondary | ICD-10-CM | POA: Insufficient documentation

## 2016-12-29 DIAGNOSIS — N183 Chronic kidney disease, stage 3 (moderate): Secondary | ICD-10-CM | POA: Insufficient documentation

## 2016-12-29 DIAGNOSIS — R001 Bradycardia, unspecified: Secondary | ICD-10-CM | POA: Insufficient documentation

## 2016-12-29 DIAGNOSIS — F520 Hypoactive sexual desire disorder: Secondary | ICD-10-CM | POA: Diagnosis not present

## 2016-12-29 DIAGNOSIS — E785 Hyperlipidemia, unspecified: Secondary | ICD-10-CM | POA: Insufficient documentation

## 2016-12-29 DIAGNOSIS — E1122 Type 2 diabetes mellitus with diabetic chronic kidney disease: Secondary | ICD-10-CM | POA: Insufficient documentation

## 2016-12-29 DIAGNOSIS — Z01812 Encounter for preprocedural laboratory examination: Secondary | ICD-10-CM | POA: Diagnosis not present

## 2016-12-29 DIAGNOSIS — K219 Gastro-esophageal reflux disease without esophagitis: Secondary | ICD-10-CM | POA: Insufficient documentation

## 2016-12-29 DIAGNOSIS — K449 Diaphragmatic hernia without obstruction or gangrene: Secondary | ICD-10-CM | POA: Diagnosis not present

## 2016-12-29 DIAGNOSIS — K581 Irritable bowel syndrome with constipation: Secondary | ICD-10-CM | POA: Insufficient documentation

## 2016-12-29 DIAGNOSIS — I129 Hypertensive chronic kidney disease with stage 1 through stage 4 chronic kidney disease, or unspecified chronic kidney disease: Secondary | ICD-10-CM | POA: Insufficient documentation

## 2016-12-29 HISTORY — DX: Diverticulosis of intestine, part unspecified, without perforation or abscess without bleeding: K57.90

## 2016-12-29 HISTORY — DX: Personal history of other diseases of the digestive system: Z87.19

## 2016-12-29 HISTORY — DX: Anxiety disorder, unspecified: F41.9

## 2016-12-29 HISTORY — DX: Sleep apnea, unspecified: G47.30

## 2016-12-29 HISTORY — DX: Barrett's esophagus without dysplasia: K22.70

## 2016-12-29 HISTORY — DX: Chronic kidney disease, unspecified: N18.9

## 2016-12-29 HISTORY — DX: Personal history of other diseases of the respiratory system: Z87.09

## 2016-12-29 LAB — BASIC METABOLIC PANEL
Anion gap: 9 (ref 5–15)
BUN: 25 mg/dL — ABNORMAL HIGH (ref 6–20)
CO2: 27 mmol/L (ref 22–32)
Calcium: 9.4 mg/dL (ref 8.9–10.3)
Chloride: 102 mmol/L (ref 101–111)
Creatinine, Ser: 0.99 mg/dL (ref 0.44–1.00)
GFR calc Af Amer: >60 mL/min (ref >60)
GFR calc non Af Amer: 56 mL/min — ABNORMAL LOW (ref >60)
Glucose, Bld: 165 mg/dL — ABNORMAL HIGH (ref 65–99)
Potassium: 3.7 mmol/L (ref 3.5–5.1)
Sodium: 138 mmol/L (ref 135–145)

## 2016-12-29 LAB — RAPID HIV SCREEN (HIV 1/2 AB+AG)
HIV 1/2 Antibodies: NONREACTIVE
HIV-1 P24 Antigen - HIV24: NONREACTIVE

## 2016-12-29 LAB — CBC WITH DIFFERENTIAL/PLATELET
Basophils Absolute: 0 10*3/uL (ref 0–0.1)
Basophils Relative: 1 %
Eosinophils Absolute: 0.2 10*3/uL (ref 0–0.7)
Eosinophils Relative: 4 %
HCT: 39 % (ref 35.0–47.0)
Hemoglobin: 13.2 g/dL (ref 12.0–16.0)
Lymphocytes Relative: 24 %
Lymphs Abs: 1.2 10*3/uL (ref 1.0–3.6)
MCH: 30.1 pg (ref 26.0–34.0)
MCHC: 34 g/dL (ref 32.0–36.0)
MCV: 88.6 fL (ref 80.0–100.0)
Monocytes Absolute: 0.3 10*3/uL (ref 0.2–0.9)
Monocytes Relative: 7 %
Neutro Abs: 3.1 10*3/uL (ref 1.4–6.5)
Neutrophils Relative %: 64 %
Platelets: 260 10*3/uL (ref 150–440)
RBC: 4.4 MIL/uL (ref 3.80–5.20)
RDW: 15.2 % — ABNORMAL HIGH (ref 11.5–14.5)
WBC: 4.8 10*3/uL (ref 3.6–11.0)

## 2016-12-29 LAB — TYPE AND SCREEN
ABO/RH(D): O POS
Antibody Screen: NEGATIVE

## 2016-12-29 NOTE — Pre-Procedure Instructions (Signed)
EKG CALLED TO DR J ADAMS. OK TO PROCEED

## 2016-12-29 NOTE — Patient Instructions (Signed)
Your procedure is scheduled [id number] 9, 2018 (Monday) Report to Same Day Surgery 2nd floor medical mall [facility] Entrance-take elevator on left to 2nd floor.  Check in with surgery information desk.) To find out your arrival time please call [phone] between 1PM - 3PM on December 30, 2016 (Friday)  Remember: Instructions that are not followed completely may result in serious medical risk, up to and including death, or upon the discretion of your surgeon and anesthesiologist your surgery may need to be rescheduled.    _x___ 1. Do not eat food or drink liquids after midnight. No gum chewing or hard candies                                  __x__ 2. No Alcohol for 24 hours before or after surgery.   __x__3. No Smoking for 24 prior to surgery.   ____  4. Bring all medications with you on the day of surgery if instructed.    __x__ 5. Notify your doctor if there is any change in your medical condition     (cold, fever, infections).     Do not wear jewelry, make-up, hairpins, clips or nail polish.  Do not wear lotions, powders, or perfumes. You may wear deodorant.  Do not shave 48 hours prior to surgery. Men may shave face and neck.  Do not bring valuables to the hospital.    [facility] is not responsible for any belongings or valuables.               Contacts, dentures or bridgework may not be worn into surgery.  Leave your suitcase in the car. After surgery it may be brought to your room.  For patients admitted to the hospital, discharge time is determined by your  treatment team                        Patients discharged the day of surgery will not be allowed to drive home.  You will need someone to drive you home and stay with you the night of your procedure.    Please read over the following fact sheets that you were given:   [facility] Preparing for Surgery and or MRSA Information   _x___ Take anti-hypertensive (unless it includes a diuretic), cardiac, seizure, asthma,      anti-reflux and psychiatric medicines with a sip of water. These include:  1. Verapamil  2. Sertraline  3. Omeprazole (Omeprazole at bedtime on Sunday night April 8 )  4.  5.  6.  ____Fleets enema or Magnesium Citrate as directed.   ___ Use CHG Soap or sage wipes as directed on instruction sheet   _x___ Use inhalers on the day of surgery and bring to hospital day of surgery (Use Albuterol inhaler the morning of surgery and bring to hospital)  _x__ Stop Metformin and Janumet 2 days prior to surgery. (Stop Metformin on April 7 )  ____ Take 1/2 of usual insulin dose the night before surgery and none on the morning  surgery  _x___ Follow recommendations from Cardiologist, Pulmonologist or PCP regarding          stopping Aspirin, Coumadin, Pllavix ,Eliquis, Effient, or Pradaxa, and Pletal. (Stop Aspirin now)  X____Stop Anti-inflammatories such as Advil, Aleve, Ibuprofen, Motrin, Naproxen, Naprosyn, Goodies powders or aspirin products. OK to take Tylenol    _x___ Stop supplements until after surgery.  But may continue Vitamin D, Vitamin B,  And multivitamin (Stop Vitamin C, Fish Oil, Biotin, Cinnamon, now)      _x___ Bring C-Pap to the hospital.

## 2016-12-30 LAB — RPR: RPR Ser Ql: NONREACTIVE

## 2017-01-02 ENCOUNTER — Ambulatory Visit: Payer: Medicare Other | Admitting: Anesthesiology

## 2017-01-02 ENCOUNTER — Ambulatory Visit
Admission: RE | Admit: 2017-01-02 | Discharge: 2017-01-02 | Disposition: A | Discharge status: Home / Self Care | Payer: Medicare Other | Source: Ambulatory Visit | Attending: Obstetrics and Gynecology | Admitting: Obstetrics and Gynecology

## 2017-01-02 ENCOUNTER — Encounter: Payer: Self-pay | Admitting: *Deleted

## 2017-01-02 ENCOUNTER — Encounter
Admission: RE | Disposition: A | Discharge status: Home / Self Care | Payer: Self-pay | Source: Ambulatory Visit | Attending: Obstetrics and Gynecology

## 2017-01-02 DIAGNOSIS — G4730 Sleep apnea, unspecified: Secondary | ICD-10-CM | POA: Diagnosis not present

## 2017-01-02 DIAGNOSIS — I10 Essential (primary) hypertension: Secondary | ICD-10-CM | POA: Diagnosis not present

## 2017-01-02 DIAGNOSIS — Z79899 Other long term (current) drug therapy: Secondary | ICD-10-CM | POA: Insufficient documentation

## 2017-01-02 DIAGNOSIS — F419 Anxiety disorder, unspecified: Secondary | ICD-10-CM | POA: Insufficient documentation

## 2017-01-02 DIAGNOSIS — K219 Gastro-esophageal reflux disease without esophagitis: Secondary | ICD-10-CM | POA: Diagnosis not present

## 2017-01-02 DIAGNOSIS — K449 Diaphragmatic hernia without obstruction or gangrene: Secondary | ICD-10-CM | POA: Diagnosis not present

## 2017-01-02 DIAGNOSIS — N950 Postmenopausal bleeding: Secondary | ICD-10-CM | POA: Diagnosis not present

## 2017-01-02 DIAGNOSIS — Z7984 Long term (current) use of oral hypoglycemic drugs: Secondary | ICD-10-CM | POA: Diagnosis not present

## 2017-01-02 DIAGNOSIS — E119 Type 2 diabetes mellitus without complications: Secondary | ICD-10-CM | POA: Diagnosis not present

## 2017-01-02 DIAGNOSIS — I739 Peripheral vascular disease, unspecified: Secondary | ICD-10-CM | POA: Insufficient documentation

## 2017-01-02 DIAGNOSIS — N840 Polyp of corpus uteri: Secondary | ICD-10-CM | POA: Diagnosis not present

## 2017-01-02 DIAGNOSIS — E785 Hyperlipidemia, unspecified: Secondary | ICD-10-CM | POA: Insufficient documentation

## 2017-01-02 DIAGNOSIS — F329 Major depressive disorder, single episode, unspecified: Secondary | ICD-10-CM | POA: Diagnosis not present

## 2017-01-02 DIAGNOSIS — M1990 Unspecified osteoarthritis, unspecified site: Secondary | ICD-10-CM | POA: Diagnosis not present

## 2017-01-02 DIAGNOSIS — Z9889 Other specified postprocedural states: Secondary | ICD-10-CM

## 2017-01-02 HISTORY — PX: HYSTEROSCOPY WITH D & C: SHX1775

## 2017-01-02 LAB — ABO/RH: ABO/RH(D): O POS

## 2017-01-02 LAB — GLUCOSE, CAPILLARY
Glucose-Capillary: 204 mg/dL — ABNORMAL HIGH (ref 65–99)
Glucose-Capillary: 166 mg/dL — ABNORMAL HIGH (ref 65–99)

## 2017-01-02 SURGERY — DILATATION AND CURETTAGE /HYSTEROSCOPY
Anesthesia: General | Site: Uterus | Wound class: Clean Contaminated

## 2017-01-02 MED ORDER — PROPOFOL 10 MG/ML IV BOLUS
INTRAVENOUS | Status: DC | PRN
  Administered 2017-01-02: 50 mg via INTRAVENOUS
  Administered 2017-01-02: 150 mg via INTRAVENOUS

## 2017-01-02 MED ORDER — FENTANYL CITRATE (PF) 100 MCG/2ML IJ SOLN
INTRAMUSCULAR | Status: AC
  Filled 2017-01-02: qty 2

## 2017-01-02 MED ORDER — PROPOFOL 10 MG/ML IV BOLUS
INTRAVENOUS | Status: AC
  Filled 2017-01-02: qty 20

## 2017-01-02 MED ORDER — KETOROLAC TROMETHAMINE 30 MG/ML IJ SOLN
INTRAMUSCULAR | Status: AC
  Filled 2017-01-02: qty 1

## 2017-01-02 MED ORDER — SODIUM CHLORIDE 0.9 % IV SOLN
INTRAVENOUS | Status: DC
  Administered 2017-01-02: 12:00:00 via INTRAVENOUS

## 2017-01-02 MED ORDER — FENTANYL CITRATE (PF) 100 MCG/2ML IJ SOLN
25.0000 ug | INTRAMUSCULAR | Status: DC | PRN
  Administered 2017-01-02 (×2): 50 ug via INTRAVENOUS

## 2017-01-02 MED ORDER — ONDANSETRON HCL 4 MG/2ML IJ SOLN
INTRAMUSCULAR | Status: AC
  Filled 2017-01-02: qty 2

## 2017-01-02 MED ORDER — DEXAMETHASONE SODIUM PHOSPHATE 10 MG/ML IJ SOLN
INTRAMUSCULAR | Status: AC
  Filled 2017-01-02: qty 1

## 2017-01-02 MED ORDER — IBUPROFEN 800 MG PO TABS: 800.0000 mg | ORAL_TABLET | Freq: Three times a day (TID) | ORAL | 1 refills | Status: DC

## 2017-01-02 MED ORDER — DEXAMETHASONE SODIUM PHOSPHATE 10 MG/ML IJ SOLN
INTRAMUSCULAR | Status: DC | PRN
  Administered 2017-01-02: 5 mg via INTRAVENOUS

## 2017-01-02 MED ORDER — FENTANYL CITRATE (PF) 100 MCG/2ML IJ SOLN
INTRAMUSCULAR | Status: DC | PRN
  Administered 2017-01-02: 25 ug via INTRAVENOUS

## 2017-01-02 MED ORDER — ONDANSETRON HCL 4 MG/2ML IJ SOLN
INTRAMUSCULAR | Status: DC | PRN
  Administered 2017-01-02: 4 mg via INTRAVENOUS

## 2017-01-02 MED ORDER — LIDOCAINE HCL (PF) 2 % IJ SOLN
INTRAMUSCULAR | Status: AC
  Filled 2017-01-02: qty 2

## 2017-01-02 MED ORDER — MIDAZOLAM HCL 2 MG/2ML IJ SOLN
INTRAMUSCULAR | Status: AC
  Filled 2017-01-02: qty 2

## 2017-01-02 MED ORDER — LIDOCAINE HCL (CARDIAC) 20 MG/ML IV SOLN
INTRAVENOUS | Status: DC | PRN
  Administered 2017-01-02: 80 mg via INTRAVENOUS

## 2017-01-02 MED ORDER — LACTATED RINGERS IV SOLN
INTRAVENOUS | Status: DC
  Administered 2017-01-02: 12:00:00 via INTRAVENOUS

## 2017-01-02 SURGICAL SUPPLY — 12 items
BAG INFUSER PRESSURE 100CC (MISCELLANEOUS) ×2 IMPLANT
CATH ROBINSON RED A/P 16FR (CATHETERS) ×2 IMPLANT
GLOVE BIO SURGEON STRL SZ8 (GLOVE) ×2 IMPLANT
GOWN STRL REUS W/ TWL LRG LVL3 (GOWN DISPOSABLE) ×1 IMPLANT
GOWN STRL REUS W/ TWL XL LVL3 (GOWN DISPOSABLE) ×1 IMPLANT
GOWN STRL REUS W/TWL LRG LVL3 (GOWN DISPOSABLE) ×1
GOWN STRL REUS W/TWL XL LVL3 (GOWN DISPOSABLE) ×1
IV LACTATED RINGERS 1000ML (IV SOLUTION) ×2 IMPLANT
KIT RM TURNOVER CYSTO AR (KITS) ×2 IMPLANT
PACK DNC HYST (MISCELLANEOUS) ×2 IMPLANT
PAD OB MATERNITY 4.3X12.25 (PERSONAL CARE ITEMS) ×2 IMPLANT
PAD PREP 24X41 OB/GYN DISP (PERSONAL CARE ITEMS) ×2 IMPLANT

## 2017-01-02 NOTE — Transfer of Care (Signed)
Immediate Anesthesia Transfer of Care Note  Patient: Sandra Hendrix  Procedure(s) Performed: Procedure(s): DILATATION AND CURETTAGE /HYSTEROSCOPY (N/A)  Patient Location: PACU  Anesthesia Type:General  Level of Consciousness: awake  Airway & Oxygen Therapy: Patient connected to nasal cannula oxygen  Post-op Assessment: Report given to RN  Post vital signs: stable  Last Vitals:  Vitals:   01/02/17 1202  BP: 131/66  Pulse: 70  Resp: 16  Temp: 36.6 C    Last Pain:  Vitals:   01/02/17 1202  TempSrc: Oral         Complications: No apparent anesthesia complications

## 2017-01-02 NOTE — Anesthesia Postprocedure Evaluation (Signed)
Anesthesia Post Note  Patient: Addi P Elbaum  Procedure(s) Performed: Procedure(s) (LRB): DILATATION AND CURETTAGE /HYSTEROSCOPY (N/A)  Patient location during evaluation: PACU Anesthesia Type: General Level of consciousness: awake and alert Pain management: pain level controlled Vital Signs Assessment: post-procedure vital signs reviewed and stable Respiratory status: spontaneous breathing, nonlabored ventilation, respiratory function stable and patient connected to nasal cannula oxygen Cardiovascular status: blood pressure returned to baseline and stable Postop Assessment: no signs of nausea or vomiting Anesthetic complications: no     Last Vitals:  Vitals:   01/02/17 1405 01/02/17 1436  BP: 136/73 127/68  Pulse: (!) 57 60  Resp: 14 14  Temp: 36.2 C     Last Pain:  Vitals:   01/02/17 1405  TempSrc: Temporal  PainSc: 0-No pain                 Precious Haws Hartlyn Reigel

## 2017-01-02 NOTE — Anesthesia Procedure Notes (Signed)
Procedure Name: LMA Insertion Date/Time: 01/02/2017 12:33 PM Performed by: Zetta Bills Pre-anesthesia Checklist: Patient identified, Emergency Drugs available, Suction available and Patient being monitored Patient Re-evaluated:Patient Re-evaluated prior to inductionOxygen Delivery Method: Circle system utilized Preoxygenation: Pre-oxygenation with 100% oxygen Intubation Type: IV induction Ventilation: Mask ventilation without difficulty LMA: LMA inserted LMA Size: 3.5 Number of attempts: 1 Tube secured with: Tape Dental Injury: Teeth and Oropharynx as per pre-operative assessment

## 2017-01-02 NOTE — Discharge Instructions (Signed)
Dilation and Curettage or Vacuum Curettage, Care After These instructions give you information about caring for yourself after your procedure. Your doctor may also give you more specific instructions. Call your doctor if you have any problems or questions after your procedure. Follow these instructions at home: Activity   Do not drive or use heavy machinery while taking prescription pain medicine.  For 24 hours after your procedure, avoid driving.  Take short walks often, followed by rest periods. Ask your doctor what activities are safe for you. After one or two days, you may be able to return to your normal activities.  Do not lift anything that is heavier than 10 lb (4.5 kg) until your doctor approves.  For at least 2 weeks, or as long as told by your doctor:  Do not douche.  Do not use tampons.  Do not have sex. General instructions   Take over-the-counter and prescription medicines only as told by your doctor. This is very important if you take blood thinning medicine.  Do not take baths, swim, or use a hot tub until your doctor approves. Take showers instead of baths.  Wear compression stockings as told by your doctor.  It is up to you to get the results of your procedure. Ask your doctor when your results will be ready.  Keep all follow-up visits as told by your doctor. This is important. Contact a doctor if:  You have very bad cramps that get worse or do not get better with medicine.  You have very bad pain in your belly (abdomen).  You cannot drink fluids without throwing up (vomiting).  You get pain in a different part of the area between your belly and thighs (pelvis).  You have bad-smelling discharge from your vagina.  You have a rash. Get help right away if:  You are bleeding a lot from your vagina. A lot of bleeding means soaking more than one sanitary pad in an hour, for 2 hours in a row.  You have clumps of blood (blood clots) coming from your  vagina.  You have a fever or chills.  Your belly feels very tender or hard.  You have chest pain.  You have trouble breathing.  You cough up blood.  You feel dizzy.  You feel light-headed.  You pass out (faint).  You have pain in your neck or shoulder area. Summary  Take short walks often, followed by rest periods. Ask your doctor what activities are safe for you. After one or two days, you may be able to return to your normal activities.  Do not lift anything that is heavier than 10 lb (4.5 kg) until your doctor approves.  Do not take baths, swim, or use a hot tub until your doctor approves. Take showers instead of baths.  Contact your doctor if you have any symptoms of infection, like bad-smelling discharge from your vagina. This information is not intended to replace advice given to you by your health care provider. Make sure you discuss any questions you have with your health care provider. Document Released: 06/21/2008 Document Revised: 05/30/2016 Document Reviewed: 05/30/2016 Elsevier Interactive Patient Education  2017 [place] Anesthesia, Adult, Care After These instructions provide you with information about caring for yourself after your procedure. Your health care provider may also give you more specific instructions. Your treatment has been planned according to current medical practices, but problems sometimes occur. Call your health care provider if you have any problems or questions after your procedure.  What can I expect after the procedure? After the procedure, it is common to have:  Vomiting.  A sore throat.  Mental slowness. It is common to feel:  Nauseous.  Cold or shivery.  Sleepy.  Tired.  Sore or achy, even in parts of your body where you did not have surgery. Follow these instructions at home: For at least 24 hours after the procedure:   Do not:  Participate in activities where you could fall or become  injured.  Drive.  Use heavy machinery.  Drink alcohol.  Take sleeping pills or medicines that cause drowsiness.  Make important decisions or sign legal documents.  Take care of children on your own.  Rest. Eating and drinking   If you vomit, drink water, juice, or soup when you can drink without vomiting.  Drink enough fluid to keep your urine clear or pale yellow.  Make sure you have little or no nausea before eating solid foods.  Follow the diet recommended by your health care provider. General instructions   Have a responsible adult stay with you until you are awake and alert.  Return to your normal activities as told by your health care provider. Ask your health care provider what activities are safe for you.  Take over-the-counter and prescription medicines only as told by your health care provider.  If you smoke, do not smoke without supervision.  Keep all follow-up visits as told by your health care provider. This is important. Contact a health care provider if:  You continue to have nausea or vomiting at home, and medicines are not helpful.  You cannot drink fluids or start eating again.  You cannot urinate after 8-12 hours.  You develop a skin rash.  You have fever.  You have increasing redness at the site of your procedure. Get help right away if:  You have difficulty breathing.  You have chest pain.  You have unexpected bleeding.  You feel that you are having a life-threatening or urgent problem. This information is not intended to replace advice given to you by your health care provider. Make sure you discuss any questions you have with your health care provider. Document Released: 12/19/2000 Document Revised: 02/15/2016 Document Reviewed: 08/27/2015 Elsevier Interactive Patient Education  2017 [employer].

## 2017-01-02 NOTE — Anesthesia Post-op Follow-up Note (Cosign Needed)
Anesthesia QCDR form completed.        

## 2017-01-02 NOTE — H&P (View-Only) (Signed)
GYN ENCOUNTER NOTE  Subjective:  PREOPERATIVE HISTORY AND PHYSICAL    Date of surgery: 01/02/2017 Procedure: Hysteroscopy/D&C Diagnosis: 1. Postmenopausal bleeding 2. Cervical stenosis 3. Uterine fibroids 4. Atypical glandular cells on endometrial biopsy     Sandra Hendrix is a 71 y.o. G31P2002 female is here for gynecologic evaluation of the following issues:  1. Postmenopausal bleeding 2. Atypical glandular cells on endometrial biopsy  Patient had episode of postmenopausal bleeding. Pap smear showed endometrial cells present. Endometrial biopsy absolutely performed was abnormal  Endometrial biopsy pathology: ENDOMETRIUM, BIOPSY:  SCANT AND MINUTE FRAGMENTS OF ATYPICAL ENDOMETRIUM WITH GLANDULAR  CROWDING. BACKGROUND STRIPS OF ATROPHIC ENDOMETRIAL EPITHELIUM  ARE ALSO PRESENT (SEE NOTE).  NOTE: Additional sampling is recommended to rule out hyperplasia  and carcinoma.    10/17/2016 ultrasound findings FINDINGS: Uterus  Measurements: 7.8 x 4.8 x 6.2 cm. Large 5.8 x 4.3 x 5.3 cm mass is noted within the a uterine body. Echodensities consistent calcifications noted within the mass. Finding most consistent with a fibroid.  Endometrium  Thickness: Could not identified due to large uterine mass.  Right ovary  Measurements: 2.6 x 1.4 x 1.4 cm. Normal appearance/no adnexal mass.  Left ovary  Measurements: 4.6 x 2.4 x 4.4 cm. Normal appearance/no adnexal mass. 4.3 x 2.0 x 3.9 cm simple cyst left ovary. Similar finding noted on prior CT.  Other findings  No abnormal free fluid.  Gynecologic History No LMP recorded. Patient is postmenopausal. Contraception: post menopausal status Last Pap:10/17/2016 negative for intraepithelial lesions or malignancy. Benign reparative/reactive changes. ENDOMETRIAL CELLS PRESENT  Obstetric History OB History  Gravida Para Term Preterm AB Living  2 2 2     2   SAB TAB Ectopic Multiple Live Births          2    # Outcome  Date GA Lbr Len/2nd Weight Sex Delivery Anes PTL Lv  2 Term 1972   9 lb (4.082 kg) F Vag-Spont   LIV  1 Term 1970   9 lb (4.082 kg) F Vag-Spont   LIV      Past Medical History:  Diagnosis Date  . Allergy   . Arthritis   . Depression   . Diabetes mellitus without complication ([place])   . GERD (gastroesophageal reflux disease)   . Hyperlipidemia   . Hypertension   . Urinary incontinence   . Varicose veins     Past Surgical History:  Procedure Laterality Date  . CHOLECYSTECTOMY    . COLONOSCOPY WITH PROPOFOL N/A 06/14/2016   Procedure: COLONOSCOPY WITH PROPOFOL;  Surgeon: Lollie Sails, MD;  Location: [facility] ENDOSCOPY;  Service: Endoscopy;  Laterality: N/A;  . ESOPHAGOGASTRODUODENOSCOPY (EGD) WITH PROPOFOL N/A 06/14/2016   Procedure: ESOPHAGOGASTRODUODENOSCOPY (EGD) WITH PROPOFOL;  Surgeon: Lollie Sails, MD;  Location: [facility] ENDOSCOPY;  Service: Endoscopy;  Laterality: N/A;  . JOINT REPLACEMENT Right 2007  . JOINT REPLACEMENT Left 2004  . ROTATOR CUFF REPAIR Right   . UPPER GI ENDOSCOPY     x2    Current Outpatient Prescriptions on File Prior to Visit  Medication Sig Dispense Refill  . ACCU-CHEK AVIVA PLUS test strip Test once daily 100 each 2  . albuterol (PROVENTIL HFA;VENTOLIN HFA) 108 (90 BASE) MCG/ACT inhaler Inhale 2 puffs into the lungs every 6 (six) hours as needed for wheezing.    . Ascorbic Acid (VITAMIN C) 1000 MG tablet Take 1,000 mg by mouth daily.     Marland Kitchen aspirin EC 81 MG tablet Take 81 mg by mouth every evening.     Marland Kitchen  atorvastatin (LIPITOR) 40 MG tablet TAKE 1 TABLET BY MOUTH  DAILY AT 6 PM. (Patient taking differently: TAKE 20 mg BY MOUTH  DAILY AT 6 PM.) 90 tablet 2  . azelastine (ASTEPRO) 137 MCG/SPRAY nasal spray Place 1 spray into the nose 2 (two) times daily. Use in each nostril as directed (Patient taking differently: Place 1 spray into the nose daily. Use in each nostril as directed) 30 mL 11  . BIOTIN PO Take 1 tablet by mouth 2 (two) times daily. Take  1000mg  by mouth twice daily.     . Cholecalciferol (D3-1000 PO) Take 1,000 Int'l Units by mouth every morning.     Marland Kitchen CINNAMON PO Take 1,000 mg by mouth 2 (two) times daily.    . fish oil-omega-3 fatty acids 1000 MG capsule Take 1 capsule by mouth daily.    . fluticasone (FLONASE) 50 MCG/ACT nasal spray Place 1 spray into both nostrils daily. 48 g 1  . furosemide (LASIX) 20 MG tablet Take 1 tablet by mouth  every other day 45 tablet 2  . glipiZIDE (GLUCOTROL XL) 10 MG 24 hr tablet Take 1 tablet by mouth  daily 90 tablet 2  . Lancet Devices (ACCU-CHEK SOFTCLIX) lancets Use to test blood sugars 1 -2 times daily 1 each 3  . loratadine (CLARITIN) 10 MG tablet Take 10 mg by mouth daily.    Marland Kitchen losartan-hydrochlorothiazide (HYZAAR) 50-12.5 MG tablet Take 1 tablet by mouth daily. 90 tablet 0  . Meclizine HCl 25 MG CHEW Chew 25 mg by mouth daily as needed (for vertigo).     . metFORMIN (GLUCOPHAGE-XR) 750 MG 24 hr tablet Take 2 tablets by mouth  daily with breakfast 180 tablet 2  . Multiple Vitamin (MULTIVITAMIN) tablet Take 1 tablet by mouth daily.    Marland Kitchen omeprazole (PRILOSEC) 20 MG capsule Take 1 capsule by mouth  daily 90 capsule 2  . Potassium 99 MG TABS Take 1 tablet by mouth daily.    . Probiotic Product (PROBIOTIC-10) CAPS Take 1 capsule by mouth daily.     Marland Kitchen senna-docusate (SENOKOT-S) 8.6-50 MG per tablet Take 1 tablet by mouth daily. 30 tablet 0  . sertraline (ZOLOFT) 50 MG tablet Take 1 tablet by mouth  daily 90 tablet 2  . sitaGLIPtin (JANUVIA) 50 MG tablet One tablet daily    . verapamil (VERELAN PM) 180 MG 24 hr capsule Take 1 capsule by mouth  daily 90 capsule 2   No current facility-administered medications on file prior to visit.     Allergies  Allergen Reactions  . Clindamycin/Lincomycin Rash  . Codeine Rash  . Morphine And Related Nausea Only  . Talwin [Pentazocine] Nausea And Vomiting  . Lisinopril Cough    cough  . Oxycodone-Acetaminophen Other (See Comments)    Pt states this  medication makes her bowels stop moving  . Cleocin [Clindamycin Hcl] Rash    Social History   Social History  . Marital status: Married    Spouse name: N/A  . Number of children: N/A  . Years of education: N/A   Occupational History  . Not on file.   Social History Main Topics  . Smoking status: Never Smoker  . Smokeless tobacco: Never Used  . Alcohol use No  . Drug use: No  . Sexual activity: No   Other Topics Concern  . Not on file   Social History Narrative  . No narrative on file    Family History  Problem Relation Age of Onset  .  Stroke Mother   . Hypertension Mother   . Lupus Mother   . Alcohol abuse Father   . Diabetes Father   . Cancer Father     Liver Cancer  . Arthritis Sister   . Diabetes Sister   . Hyperlipidemia Daughter   . Hypertension Daughter   . Mental retardation Daughter   . Cancer Maternal Aunt     ovarian ca  . Diabetes Paternal Aunt   . Drug abuse Paternal Aunt   . Cancer Maternal Grandfather     colon ca    The following portions of the patient's history were reviewed and updated as appropriate: allergies, current medications, past family history, past medical history, past social history, past surgical history and problem list.  Review of Systems Review of Systems - General ROS: negative for - chills, fatigue, fever, hot flashes, malaise or night sweats Hematological and Lymphatic ROS: negative for - bleeding problems or swollen lymph nodes Gastrointestinal ROS: negative for - abdominal pain, blood in stools, change in bowel habits and nausea/vomiting Musculoskeletal ROS: negative for - joint pain, muscle pain or muscular weakness Genito-Urinary ROS: negative for - change in menstrual cycle, dysmenorrhea, dyspareunia, dysuria, genital discharge, genital ulcers, hematuria, incontinence, irregular/heavy menses, nocturia or pelvic painjj  Objective:   BP 102/66   Pulse 69   Wt 181 lb 1.6 oz (82.1 kg)   BMI 34.22 kg/m    CONSTITUTIONAL: Well-developed, well-nourished female in no acute distress.  HENT:  Normocephalic, atraumatic.  NECK: Normal range of motion, supple, no masses.  Normal thyroid.  SKIN: Skin is warm and dry. No rash noted. Not diaphoretic. No erythema. No pallor. [place]: Alert and oriented to person, place, and time. PSYCHIATRIC: Normal mood and affect. Normal behavior. Normal judgment and thought content. CARDIOVASCULAR: Regular rate and rhythm without murmur RESPIRATORY: Clear BREASTS: Not Examined ABDOMEN: Soft, non distended; Non tender.  No Organomegaly. PELVIC: (11/16/2016)             External Genitalia: Normal             BUS: Normal             Vagina: mild to moderate atrophy             Cervix: Normal; no lesions; no cervical motion tenderness             Uterus: top normal size; normal shape,consistency, mobile, nontender             Adnexa: Normal; nonpalpable nontender             RV: Normal external exam             Bladder: Nontender MUSCULOSKELETAL: Normal range of motion. No tenderness.  No cyanosis, clubbing, or edema.     Assessment:   1. Preoperative examination  2. Post-menopausal bleeding  3. Uterine leiomyoma, unspecified location  4. Cervical stenosis (uterine cervix)  5. Atypical glandular cells on Pap smear/endometrial biopsy      Plan:   Hysteroscopy/D&C  Prep calcium: Patient is to undergo hysteroscopy/D&C for evaluation of postmenopausal bleeding and atypical glandular cells found on endometrial biopsy. Patient is understanding of the planned procedure and is aware of and is accepting of all surgical risks which include but are not limited to bleeding, infection, pelvic organ injury with need for repair, blood clot disorders, anesthesia risks, etc. All questions have been answered. Informed consent is given. Patient is ready and willing to proceed with surgery as scheduled.  Hassell Done  A Defrancesco, MD  Note: This dictation was prepared with  Dragon dictation along with smaller phrase technology. Any transcriptional errors that result from this process are unintentional.  GYN ENCOUNTER NOTE  Subjective:  PREOPERATIVE HISTORY AND PHYSICAL    Date of surgery: 01/02/2017 Procedure: Hysteroscopy/D&C Diagnosis: 1. Postmenopausal bleeding 2. Cervical stenosis 3. Uterine fibroids 4. Atypical glandular cells on endometrial biopsy     Sandra Hendrix is a 71 y.o. G71P2002 female is here for gynecologic evaluation of the following issues:  1. Postmenopausal bleeding 2. Atypical glandular cells on endometrial biopsy  Patient had episode of postmenopausal bleeding. Pap smear showed endometrial cells present. Endometrial biopsy absolutely performed was abnormal  Endometrial biopsy pathology: ENDOMETRIUM, BIOPSY:  SCANT AND MINUTE FRAGMENTS OF ATYPICAL ENDOMETRIUM WITH GLANDULAR  CROWDING. BACKGROUND STRIPS OF ATROPHIC ENDOMETRIAL EPITHELIUM  ARE ALSO PRESENT (SEE NOTE).  NOTE: Additional sampling is recommended to rule out hyperplasia  and carcinoma.    10/17/2016 ultrasound findings FINDINGS: Uterus  Measurements: 7.8 x 4.8 x 6.2 cm. Large 5.8 x 4.3 x 5.3 cm mass is noted within the a uterine body. Echodensities consistent calcifications noted within the mass. Finding most consistent with a fibroid.  Endometrium  Thickness: Could not identified due to large uterine mass.  Right ovary  Measurements: 2.6 x 1.4 x 1.4 cm. Normal appearance/no adnexal mass.  Left ovary  Measurements: 4.6 x 2.4 x 4.4 cm. Normal appearance/no adnexal mass. 4.3 x 2.0 x 3.9 cm simple cyst left ovary. Similar finding noted on prior CT.  Other findings  No abnormal free fluid.  Gynecologic History No LMP recorded. Patient is postmenopausal. Contraception: post menopausal status Last Pap:10/17/2016 negative for intraepithelial lesions or malignancy. Benign reparative/reactive changes. ENDOMETRIAL CELLS PRESENT  Obstetric  History OB History  Gravida Para Term Preterm AB Living  2 2 2     2   SAB TAB Ectopic Multiple Live Births          2    # Outcome Date GA Lbr Len/2nd Weight Sex Delivery Anes PTL Lv  2 Term 1972   9 lb (4.082 kg) F Vag-Spont   LIV  1 Term 1970   9 lb (4.082 kg) F Vag-Spont   LIV      Past Medical History:  Diagnosis Date  . Allergy   . Arthritis   . Depression   . Diabetes mellitus without complication ([place])   . GERD (gastroesophageal reflux disease)   . Hyperlipidemia   . Hypertension   . Urinary incontinence   . Varicose veins     Past Surgical History:  Procedure Laterality Date  . CHOLECYSTECTOMY    . COLONOSCOPY WITH PROPOFOL N/A 06/14/2016   Procedure: COLONOSCOPY WITH PROPOFOL;  Surgeon: Lollie Sails, MD;  Location: [facility] ENDOSCOPY;  Service: Endoscopy;  Laterality: N/A;  . ESOPHAGOGASTRODUODENOSCOPY (EGD) WITH PROPOFOL N/A 06/14/2016   Procedure: ESOPHAGOGASTRODUODENOSCOPY (EGD) WITH PROPOFOL;  Surgeon: Lollie Sails, MD;  Location: [facility] ENDOSCOPY;  Service: Endoscopy;  Laterality: N/A;  . JOINT REPLACEMENT Right 2007  . JOINT REPLACEMENT Left 2004  . ROTATOR CUFF REPAIR Right   . UPPER GI ENDOSCOPY     x2    Current Outpatient Prescriptions on File Prior to Visit  Medication Sig Dispense Refill  . ACCU-CHEK AVIVA PLUS test strip Test once daily 100 each 2  . albuterol (PROVENTIL HFA;VENTOLIN HFA) 108 (90 BASE) MCG/ACT inhaler Inhale 2 puffs into the lungs every 6 (six) hours as needed for wheezing.    . Ascorbic  Acid (VITAMIN C) 1000 MG tablet Take 1,000 mg by mouth daily.     Marland Kitchen aspirin EC 81 MG tablet Take 81 mg by mouth every evening.     Marland Kitchen atorvastatin (LIPITOR) 40 MG tablet TAKE 1 TABLET BY MOUTH  DAILY AT 6 PM. (Patient taking differently: TAKE 20 mg BY MOUTH  DAILY AT 6 PM.) 90 tablet 2  . azelastine (ASTEPRO) 137 MCG/SPRAY nasal spray Place 1 spray into the nose 2 (two) times daily. Use in each nostril as directed (Patient taking differently:  Place 1 spray into the nose daily. Use in each nostril as directed) 30 mL 11  . BIOTIN PO Take 1 tablet by mouth 2 (two) times daily. Take 1000mg  by mouth twice daily.     . Cholecalciferol (D3-1000 PO) Take 1,000 Int'l Units by mouth every morning.     Marland Kitchen CINNAMON PO Take 1,000 mg by mouth 2 (two) times daily.    . fish oil-omega-3 fatty acids 1000 MG capsule Take 1 capsule by mouth daily.    . fluticasone (FLONASE) 50 MCG/ACT nasal spray Place 1 spray into both nostrils daily. 48 g 1  . furosemide (LASIX) 20 MG tablet Take 1 tablet by mouth  every other day 45 tablet 2  . glipiZIDE (GLUCOTROL XL) 10 MG 24 hr tablet Take 1 tablet by mouth  daily 90 tablet 2  . Lancet Devices (ACCU-CHEK SOFTCLIX) lancets Use to test blood sugars 1 -2 times daily 1 each 3  . loratadine (CLARITIN) 10 MG tablet Take 10 mg by mouth daily.    Marland Kitchen losartan-hydrochlorothiazide (HYZAAR) 50-12.5 MG tablet Take 1 tablet by mouth daily. 90 tablet 0  . Meclizine HCl 25 MG CHEW Chew 25 mg by mouth daily as needed (for vertigo).     . metFORMIN (GLUCOPHAGE-XR) 750 MG 24 hr tablet Take 2 tablets by mouth  daily with breakfast 180 tablet 2  . Multiple Vitamin (MULTIVITAMIN) tablet Take 1 tablet by mouth daily.    Marland Kitchen omeprazole (PRILOSEC) 20 MG capsule Take 1 capsule by mouth  daily 90 capsule 2  . Potassium 99 MG TABS Take 1 tablet by mouth daily.    . Probiotic Product (PROBIOTIC-10) CAPS Take 1 capsule by mouth daily.     Marland Kitchen senna-docusate (SENOKOT-S) 8.6-50 MG per tablet Take 1 tablet by mouth daily. 30 tablet 0  . sertraline (ZOLOFT) 50 MG tablet Take 1 tablet by mouth  daily 90 tablet 2  . sitaGLIPtin (JANUVIA) 50 MG tablet One tablet daily    . verapamil (VERELAN PM) 180 MG 24 hr capsule Take 1 capsule by mouth  daily 90 capsule 2   No current facility-administered medications on file prior to visit.     Allergies  Allergen Reactions  . Clindamycin/Lincomycin Rash  . Codeine Rash  . Morphine And Related Nausea Only  .  Talwin [Pentazocine] Nausea And Vomiting  . Lisinopril Cough    cough  . Oxycodone-Acetaminophen Other (See Comments)    Pt states this medication makes her bowels stop moving  . Cleocin [Clindamycin Hcl] Rash    Social History   Social History  . Marital status: Married    Spouse name: N/A  . Number of children: N/A  . Years of education: N/A   Occupational History  . Not on file.   Social History Main Topics  . Smoking status: Never Smoker  . Smokeless tobacco: Never Used  . Alcohol use No  . Drug use: No  . Sexual activity:  No   Other Topics Concern  . Not on file   Social History Narrative  . No narrative on file    Family History  Problem Relation Age of Onset  . Stroke Mother   . Hypertension Mother   . Lupus Mother   . Alcohol abuse Father   . Diabetes Father   . Cancer Father     Liver Cancer  . Arthritis Sister   . Diabetes Sister   . Hyperlipidemia Daughter   . Hypertension Daughter   . Mental retardation Daughter   . Cancer Maternal Aunt     ovarian ca  . Diabetes Paternal Aunt   . Drug abuse Paternal Aunt   . Cancer Maternal Grandfather     colon ca    The following portions of the patient's history were reviewed and updated as appropriate: allergies, current medications, past family history, past medical history, past social history, past surgical history and problem list.  Review of Systems Review of Systems - General ROS: negative for - chills, fatigue, fever, hot flashes, malaise or night sweats Hematological and Lymphatic ROS: negative for - bleeding problems or swollen lymph nodes Gastrointestinal ROS: negative for - abdominal pain, blood in stools, change in bowel habits and nausea/vomiting Musculoskeletal ROS: negative for - joint pain, muscle pain or muscular weakness Genito-Urinary ROS: negative for - change in menstrual cycle, dysmenorrhea, dyspareunia, dysuria, genital discharge, genital ulcers, hematuria, incontinence,  irregular/heavy menses, nocturia or pelvic painjj  Objective:   BP 102/66   Pulse 69   Wt 181 lb 1.6 oz (82.1 kg)   BMI 34.22 kg/m  CONSTITUTIONAL: Well-developed, well-nourished female in no acute distress.  HENT:  Normocephalic, atraumatic.  NECK: Normal range of motion, supple, no masses.  Normal thyroid.  SKIN: Skin is warm and dry. No rash noted. Not diaphoretic. No erythema. No pallor. [place]: Alert and oriented to person, place, and time. PSYCHIATRIC: Normal mood and affect. Normal behavior. Normal judgment and thought content. CARDIOVASCULAR: Regular rate and rhythm without murmur RESPIRATORY: Clear BREASTS: Not Examined ABDOMEN: Soft, non distended; Non tender.  No Organomegaly. PELVIC: (11/16/2016)             External Genitalia: Normal             BUS: Normal             Vagina: mild to moderate atrophy             Cervix: Normal; no lesions; no cervical motion tenderness             Uterus: top normal size; normal shape,consistency, mobile, nontender             Adnexa: Normal; nonpalpable nontender             RV: Normal external exam             Bladder: Nontender MUSCULOSKELETAL: Normal range of motion. No tenderness.  No cyanosis, clubbing, or edema.     Assessment:   1. Preoperative examination  2. Post-menopausal bleeding  3. Uterine leiomyoma, unspecified location  4. Cervical stenosis (uterine cervix)  5. Atypical glandular cells on Pap smear/endometrial biopsy      Plan:   Hysteroscopy/D&C  Prep calcium: Patient is to undergo hysteroscopy/D&C for evaluation of postmenopausal bleeding and atypical glandular cells found on endometrial biopsy. Patient is understanding of the planned procedure and is aware of and is accepting of all surgical risks which include but are not limited to bleeding, infection, pelvic  organ injury with need for repair, blood clot disorders, anesthesia risks, etc. All questions have been answered. Informed consent is  given. Patient is ready and willing to proceed with surgery as scheduled.  Brayton Mars, MD  Note: This dictation was prepared with Dragon dictation along with smaller phrase technology. Any transcriptional errors that result from this process are unintentional.  GYN ENCOUNTER NOTE  Subjective:  PREOPERATIVE HISTORY AND PHYSICAL    Date of surgery: 01/02/2017 Procedure: Hysteroscopy/D&C Diagnosis: 1. Postmenopausal bleeding 2. Cervical stenosis 3. Uterine fibroids 4. Atypical glandular cells on endometrial biopsy     Sandra Hendrix is a 71 y.o. G53P2002 female is here for gynecologic evaluation of the following issues:  1. Postmenopausal bleeding 2. Atypical glandular cells on endometrial biopsy  Patient had episode of postmenopausal bleeding. Pap smear showed endometrial cells present. Endometrial biopsy absolutely performed was abnormal  Endometrial biopsy pathology: ENDOMETRIUM, BIOPSY:  SCANT AND MINUTE FRAGMENTS OF ATYPICAL ENDOMETRIUM WITH GLANDULAR  CROWDING. BACKGROUND STRIPS OF ATROPHIC ENDOMETRIAL EPITHELIUM  ARE ALSO PRESENT (SEE NOTE).  NOTE: Additional sampling is recommended to rule out hyperplasia  and carcinoma.    10/17/2016 ultrasound findings FINDINGS: Uterus  Measurements: 7.8 x 4.8 x 6.2 cm. Large 5.8 x 4.3 x 5.3 cm mass is noted within the a uterine body. Echodensities consistent calcifications noted within the mass. Finding most consistent with a fibroid.  Endometrium  Thickness: Could not identified due to large uterine mass.  Right ovary  Measurements: 2.6 x 1.4 x 1.4 cm. Normal appearance/no adnexal mass.  Left ovary  Measurements: 4.6 x 2.4 x 4.4 cm. Normal appearance/no adnexal mass. 4.3 x 2.0 x 3.9 cm simple cyst left ovary. Similar finding noted on prior CT.  Other findings  No abnormal free fluid.  Gynecologic History No LMP recorded. Patient is postmenopausal. Contraception: post menopausal status Last  Pap:10/17/2016 negative for intraepithelial lesions or malignancy. Benign reparative/reactive changes. ENDOMETRIAL CELLS PRESENT  Obstetric History OB History  Gravida Para Term Preterm AB Living  2 2 2     2   SAB TAB Ectopic Multiple Live Births          2    # Outcome Date GA Lbr Len/2nd Weight Sex Delivery Anes PTL Lv  2 Term 1972   9 lb (4.082 kg) F Vag-Spont   LIV  1 Term 1970   9 lb (4.082 kg) F Vag-Spont   LIV      Past Medical History:  Diagnosis Date  . Allergy   . Arthritis   . Depression   . Diabetes mellitus without complication ([place])   . GERD (gastroesophageal reflux disease)   . Hyperlipidemia   . Hypertension   . Urinary incontinence   . Varicose veins     Past Surgical History:  Procedure Laterality Date  . CHOLECYSTECTOMY    . COLONOSCOPY WITH PROPOFOL N/A 06/14/2016   Procedure: COLONOSCOPY WITH PROPOFOL;  Surgeon: Lollie Sails, MD;  Location: [facility] ENDOSCOPY;  Service: Endoscopy;  Laterality: N/A;  . ESOPHAGOGASTRODUODENOSCOPY (EGD) WITH PROPOFOL N/A 06/14/2016   Procedure: ESOPHAGOGASTRODUODENOSCOPY (EGD) WITH PROPOFOL;  Surgeon: Lollie Sails, MD;  Location: [facility] ENDOSCOPY;  Service: Endoscopy;  Laterality: N/A;  . JOINT REPLACEMENT Right 2007  . JOINT REPLACEMENT Left 2004  . ROTATOR CUFF REPAIR Right   . UPPER GI ENDOSCOPY     x2    Current Outpatient Prescriptions on File Prior to Visit  Medication Sig Dispense Refill  . ACCU-CHEK AVIVA PLUS test strip Test once  daily 100 each 2  . albuterol (PROVENTIL HFA;VENTOLIN HFA) 108 (90 BASE) MCG/ACT inhaler Inhale 2 puffs into the lungs every 6 (six) hours as needed for wheezing.    . Ascorbic Acid (VITAMIN C) 1000 MG tablet Take 1,000 mg by mouth daily.     Marland Kitchen aspirin EC 81 MG tablet Take 81 mg by mouth every evening.     Marland Kitchen atorvastatin (LIPITOR) 40 MG tablet TAKE 1 TABLET BY MOUTH  DAILY AT 6 PM. (Patient taking differently: TAKE 20 mg BY MOUTH  DAILY AT 6 PM.) 90 tablet 2  . azelastine  (ASTEPRO) 137 MCG/SPRAY nasal spray Place 1 spray into the nose 2 (two) times daily. Use in each nostril as directed (Patient taking differently: Place 1 spray into the nose daily. Use in each nostril as directed) 30 mL 11  . BIOTIN PO Take 1 tablet by mouth 2 (two) times daily. Take 1000mg  by mouth twice daily.     . Cholecalciferol (D3-1000 PO) Take 1,000 Int'l Units by mouth every morning.     Marland Kitchen CINNAMON PO Take 1,000 mg by mouth 2 (two) times daily.    . fish oil-omega-3 fatty acids 1000 MG capsule Take 1 capsule by mouth daily.    . fluticasone (FLONASE) 50 MCG/ACT nasal spray Place 1 spray into both nostrils daily. 48 g 1  . furosemide (LASIX) 20 MG tablet Take 1 tablet by mouth  every other day 45 tablet 2  . glipiZIDE (GLUCOTROL XL) 10 MG 24 hr tablet Take 1 tablet by mouth  daily 90 tablet 2  . Lancet Devices (ACCU-CHEK SOFTCLIX) lancets Use to test blood sugars 1 -2 times daily 1 each 3  . loratadine (CLARITIN) 10 MG tablet Take 10 mg by mouth daily.    Marland Kitchen losartan-hydrochlorothiazide (HYZAAR) 50-12.5 MG tablet Take 1 tablet by mouth daily. 90 tablet 0  . Meclizine HCl 25 MG CHEW Chew 25 mg by mouth daily as needed (for vertigo).     . metFORMIN (GLUCOPHAGE-XR) 750 MG 24 hr tablet Take 2 tablets by mouth  daily with breakfast 180 tablet 2  . Multiple Vitamin (MULTIVITAMIN) tablet Take 1 tablet by mouth daily.    Marland Kitchen omeprazole (PRILOSEC) 20 MG capsule Take 1 capsule by mouth  daily 90 capsule 2  . Potassium 99 MG TABS Take 1 tablet by mouth daily.    . Probiotic Product (PROBIOTIC-10) CAPS Take 1 capsule by mouth daily.     Marland Kitchen senna-docusate (SENOKOT-S) 8.6-50 MG per tablet Take 1 tablet by mouth daily. 30 tablet 0  . sertraline (ZOLOFT) 50 MG tablet Take 1 tablet by mouth  daily 90 tablet 2  . sitaGLIPtin (JANUVIA) 50 MG tablet One tablet daily    . verapamil (VERELAN PM) 180 MG 24 hr capsule Take 1 capsule by mouth  daily 90 capsule 2   No current facility-administered medications on  file prior to visit.     Allergies  Allergen Reactions  . Clindamycin/Lincomycin Rash  . Codeine Rash  . Morphine And Related Nausea Only  . Talwin [Pentazocine] Nausea And Vomiting  . Lisinopril Cough    cough  . Oxycodone-Acetaminophen Other (See Comments)    Pt states this medication makes her bowels stop moving  . Cleocin [Clindamycin Hcl] Rash    Social History   Social History  . Marital status: Married    Spouse name: N/A  . Number of children: N/A  . Years of education: N/A   Occupational History  . Not  on file.   Social History Main Topics  . Smoking status: Never Smoker  . Smokeless tobacco: Never Used  . Alcohol use No  . Drug use: No  . Sexual activity: No   Other Topics Concern  . Not on file   Social History Narrative  . No narrative on file    Family History  Problem Relation Age of Onset  . Stroke Mother   . Hypertension Mother   . Lupus Mother   . Alcohol abuse Father   . Diabetes Father   . Cancer Father     Liver Cancer  . Arthritis Sister   . Diabetes Sister   . Hyperlipidemia Daughter   . Hypertension Daughter   . Mental retardation Daughter   . Cancer Maternal Aunt     ovarian ca  . Diabetes Paternal Aunt   . Drug abuse Paternal Aunt   . Cancer Maternal Grandfather     colon ca    The following portions of the patient's history were reviewed and updated as appropriate: allergies, current medications, past family history, past medical history, past social history, past surgical history and problem list.  Review of Systems Review of Systems - General ROS: negative for - chills, fatigue, fever, hot flashes, malaise or night sweats Hematological and Lymphatic ROS: negative for - bleeding problems or swollen lymph nodes Gastrointestinal ROS: negative for - abdominal pain, blood in stools, change in bowel habits and nausea/vomiting Musculoskeletal ROS: negative for - joint pain, muscle pain or muscular weakness Genito-Urinary ROS:  negative for - change in menstrual cycle, dysmenorrhea, dyspareunia, dysuria, genital discharge, genital ulcers, hematuria, incontinence, irregular/heavy menses, nocturia or pelvic painjj  Objective:   BP 102/66   Pulse 69   Wt 181 lb 1.6 oz (82.1 kg)   BMI 34.22 kg/m  CONSTITUTIONAL: Well-developed, well-nourished female in no acute distress.  HENT:  Normocephalic, atraumatic.  NECK: Normal range of motion, supple, no masses.  Normal thyroid.  SKIN: Skin is warm and dry. No rash noted. Not diaphoretic. No erythema. No pallor. [place]: Alert and oriented to person, place, and time. PSYCHIATRIC: Normal mood and affect. Normal behavior. Normal judgment and thought content. CARDIOVASCULAR: Regular rate and rhythm without murmur RESPIRATORY: Clear BREASTS: Not Examined ABDOMEN: Soft, non distended; Non tender.  No Organomegaly. PELVIC: (11/16/2016)             External Genitalia: Normal             BUS: Normal             Vagina: mild to moderate atrophy             Cervix: Normal; no lesions; no cervical motion tenderness             Uterus: top normal size; normal shape,consistency, mobile, nontender             Adnexa: Normal; nonpalpable nontender             RV: Normal external exam             Bladder: Nontender MUSCULOSKELETAL: Normal range of motion. No tenderness.  No cyanosis, clubbing, or edema.     Assessment:   1. Preoperative examination  2. Post-menopausal bleeding  3. Uterine leiomyoma, unspecified location  4. Cervical stenosis (uterine cervix)  5. Atypical glandular cells on Pap smear/endometrial biopsy      Plan:   Hysteroscopy/D&C  Prep calcium: Patient is to undergo hysteroscopy/D&C for evaluation of postmenopausal bleeding and atypical  glandular cells found on endometrial biopsy. Patient is understanding of the planned procedure and is aware of and is accepting of all surgical risks which include but are not limited to bleeding, infection, pelvic  organ injury with need for repair, blood clot disorders, anesthesia risks, etc. All questions have been answered. Informed consent is given. Patient is ready and willing to proceed with surgery as scheduled.  Brayton Mars, MD  Note: This dictation was prepared with Dragon dictation along with smaller phrase technology. Any transcriptional errors that result from this process are unintentional.  Progress Notes Unsigned  Encounter Date: 12/28/2016 Brayton Mars, MD  Obstetrics and Gynecology    [] Hide copied text GYN ENCOUNTER NOTE  Subjective:  PREOPERATIVE HISTORY AND PHYSICAL    Date of surgery: 01/02/2017 Procedure: Hysteroscopy/D&C Diagnosis: 1. Postmenopausal bleeding 2. Cervical stenosis 3. Uterine fibroids 4. Atypical glandular cells on endometrial biopsy                           Sandra Hendrix is a 71 y.o. G56P2002 female is here for gynecologic evaluation of the following issues:  1. Postmenopausal bleeding 2. Atypical glandular cells on endometrial biopsy  Patient had episode of postmenopausal bleeding. Pap smear showed endometrial cells present. Endometrial biopsy absolutely performed was abnormal  Endometrial biopsy pathology: ENDOMETRIUM, BIOPSY:  SCANT ANDMINUTE FRAGMENTS OF ATYPICAL ENDOMETRIUM WITH GLANDULAR  CROWDING.BACKGROUND STRIPS OF ATROPHIC ENDOMETRIAL EPITHELIUM  ARE ALSO PRESENT (SEE NOTE).  NOTE: Additional sampling is recommended to rule out hyperplasia  and carcinoma.    10/17/2016 ultrasound findings FINDINGS: Uterus  Measurements: 7.8 x 4.8 x 6.2 cm. Large 5.8 x 4.3 x 5.3 cm mass is noted within the a uterine body. Echodensities consistent calcifications noted within the mass. Finding most consistent with a fibroid.  Endometrium  Thickness: Could not identified due to large uterine mass.  Right ovary  Measurements: 2.6 x 1.4 x 1.4 cm. Normal appearance/no adnexal mass.  Left ovary  Measurements: 4.6  x 2.4 x 4.4 cm. Normal appearance/no adnexal mass. 4.3 x 2.0 x 3.9 cm simple cyst left ovary. Similar finding noted on prior CT.  Other findings  No abnormal free fluid.  Gynecologic History No LMP recorded. Patient is postmenopausal. Contraception: post menopausal status Last Pap:10/17/2016 negative for intraepithelial lesions or malignancy. Benign reparative/reactive changes. ENDOMETRIAL CELLS PRESENT  Obstetric History                 OB History  Gravida Para Term Preterm AB Living  2 2 2     2   SAB TAB Ectopic Multiple Live Births          2    # Outcome Date GA Lbr Len/2nd Weight Sex Delivery Anes PTL Lv  2 Term 1972   9 lb (4.082 kg) F Vag-Spont   LIV  1 Term 1970   9 lb (4.082 kg) F Vag-Spont   LIV          Past Medical History:  Diagnosis Date  . Allergy   . Arthritis   . Depression   . Diabetes mellitus without complication ([place])   . GERD (gastroesophageal reflux disease)   . Hyperlipidemia   . Hypertension   . Urinary incontinence   . Varicose veins          Past Surgical History:  Procedure Laterality Date  . CHOLECYSTECTOMY    . COLONOSCOPY WITH PROPOFOL N/A 06/14/2016   Procedure: COLONOSCOPY WITH PROPOFOL;  Surgeon: Lollie Sails, MD;  [facility] ENDOSCOPY;  Service: Endoscopy;  Laterality: N/A;  . ESOPHAGOGASTRODUODENOSCOPY (EGD) WITH PROPOFOL N/A 06/14/2016   Procedure: ESOPHAGOGASTRODUODENOSCOPY (EGD) WITH PROPOFOL;  Surgeon: Lollie Sails, MD;  Location: [facility] ENDOSCOPY;  Service: Endoscopy;  Laterality: N/A;  . JOINT REPLACEMENT Right 2007  . JOINT REPLACEMENT Left 2004  . ROTATOR CUFF REPAIR Right   . UPPER GI ENDOSCOPY     x2          Current Outpatient Prescriptions on File Prior to Visit  Medication Sig Dispense Refill  . ACCU-CHEK AVIVA PLUS test strip Test once daily 100 each 2  . albuterol (PROVENTIL HFA;VENTOLIN HFA) 108 (90 BASE) MCG/ACT inhaler Inhale 2 puffs into the lungs every 6  (six) hours as needed for wheezing.    . Ascorbic Acid (VITAMIN C) 1000 MG tablet Take 1,000 mg by mouth daily.     Marland Kitchen aspirin EC 81 MG tablet Take 81 mg by mouth every evening.     Marland Kitchen atorvastatin (LIPITOR) 40 MG tablet TAKE 1 TABLET BY MOUTH  DAILY AT 6 PM. (Patient taking differently: TAKE 20 mg BY MOUTH  DAILY AT 6 PM.) 90 tablet 2  . azelastine (ASTEPRO) 137 MCG/SPRAY nasal spray Place 1 spray into the nose 2 (two) times daily. Use in each nostril as directed (Patient taking differently: Place 1 spray into the nose daily. Use in each nostril as directed) 30 mL 11  . BIOTIN PO Take 1 tablet by mouth 2 (two) times daily. Take 1000mg  by mouth twice daily.     . Cholecalciferol (D3-1000 PO) Take 1,000 Int'l Units by mouth every morning.     Marland Kitchen CINNAMON PO Take 1,000 mg by mouth 2 (two) times daily.    . fish oil-omega-3 fatty acids 1000 MG capsule Take 1 capsule by mouth daily.    . fluticasone (FLONASE) 50 MCG/ACT nasal spray Place 1 spray into both nostrils daily. 48 g 1  . furosemide (LASIX) 20 MG tablet Take 1 tablet by mouth  every other day 45 tablet 2  . glipiZIDE (GLUCOTROL XL) 10 MG 24 hr tablet Take 1 tablet by mouth  daily 90 tablet 2  . Lancet Devices (ACCU-CHEK SOFTCLIX) lancets Use to test blood sugars 1 -2 times daily 1 each 3  . loratadine (CLARITIN) 10 MG tablet Take 10 mg by mouth daily.    Marland Kitchen losartan-hydrochlorothiazide (HYZAAR) 50-12.5 MG tablet Take 1 tablet by mouth daily. 90 tablet 0  . Meclizine HCl 25 MG CHEW Chew 25 mg by mouth daily as needed (for vertigo).     . metFORMIN (GLUCOPHAGE-XR) 750 MG 24 hr tablet Take 2 tablets by mouth  daily with breakfast 180 tablet 2  . Multiple Vitamin (MULTIVITAMIN) tablet Take 1 tablet by mouth daily.    Marland Kitchen omeprazole (PRILOSEC) 20 MG capsule Take 1 capsule by mouth  daily 90 capsule 2  . Potassium 99 MG TABS Take 1 tablet by mouth daily.    . Probiotic Product (PROBIOTIC-10) CAPS Take 1 capsule by mouth daily.      Marland Kitchen senna-docusate (SENOKOT-S) 8.6-50 MG per tablet Take 1 tablet by mouth daily. 30 tablet 0  . sertraline (ZOLOFT) 50 MG tablet Take 1 tablet by mouth  daily 90 tablet 2  . sitaGLIPtin (JANUVIA) 50 MG tablet One tablet daily    . verapamil (VERELAN PM) 180 MG 24 hr capsule Take 1 capsule by mouth  daily 90 capsule 2   No current facility-administered medications  on file prior to visit.          Allergies  Allergen Reactions  . Clindamycin/Lincomycin Rash  . Codeine Rash  . Morphine And Related Nausea Only  . Talwin [Pentazocine] Nausea And Vomiting  . Lisinopril Cough    cough  . Oxycodone-Acetaminophen Other (See Comments)    Pt states this medication makes her bowels stop moving  . Cleocin [Clindamycin Hcl] Rash    Social History        Social History  . Marital status: Married    Spouse name: N/A  . Number of children: N/A  . Years of education: N/A      Occupational History  . Not on file.       Social History Main Topics  . Smoking status: Never Smoker  . Smokeless tobacco: Never Used  . Alcohol use No  . Drug use: No  . Sexual activity: No   Other Topics Concern  . Not on file      Social History Narrative  . No narrative on file          Family History  Problem Relation Age of Onset  . Stroke Mother   . Hypertension Mother   . Lupus Mother   . Alcohol abuse Father   . Diabetes Father   . Cancer Father     Liver Cancer  . Arthritis Sister   . Diabetes Sister   . Hyperlipidemia Daughter   . Hypertension Daughter   . Mental retardation Daughter   . Cancer Maternal Aunt     ovarian ca  . Diabetes Paternal Aunt   . Drug abuse Paternal Aunt   . Cancer Maternal Grandfather     colon ca    The following portions of the patient's history were reviewed and updated as appropriate: allergies, current medications, past family history, past medical history, past social history, past surgical history and  problem list.  Review of Systems Review of Systems - General ROS: negative for - chills, fatigue, fever, hot flashes, malaise or night sweats Hematological and Lymphatic ROS: negative for - bleeding problems or swollen lymph nodes Gastrointestinal ROS: negative for - abdominal pain, blood in stools, change in bowel habits and nausea/vomiting Musculoskeletal ROS: negative for - joint pain, muscle pain or muscular weakness Genito-Urinary ROS: negative for - change in menstrual cycle, dysmenorrhea, dyspareunia, dysuria, genital discharge, genital ulcers, hematuria, incontinence, irregular/heavy menses, nocturia or pelvic painjj  Objective:   BP 102/66   Pulse 69   Wt 181 lb 1.6 oz (82.1 kg)   BMI 34.22 kg/m  CONSTITUTIONAL: Well-developed, well-nourished female in no acute distress.  HENT:  Normocephalic, atraumatic.  NECK: Normal range of motion, supple, no masses.  Normal thyroid.  SKIN: Skin is warm and dry. No rash noted. Not diaphoretic. No erythema. No pallor. [place]: Alert and oriented to person, place, and time. PSYCHIATRIC: Normal mood and affect. Normal behavior. Normal judgment and thought content. CARDIOVASCULAR: Regular rate and rhythm without murmur RESPIRATORY: Clear BREASTS: Not Examined ABDOMEN: Soft, non distended; Non tender.  No Organomegaly. PELVIC: (11/16/2016) External Genitalia: Normal BUS: Normal Vagina: mild to moderate atrophy Cervix: Normal; no lesions; no cervical motion tenderness Uterus: top normal size; normal shape,consistency, mobile, nontender Adnexa: Normal; nonpalpable nontender RV: Normal external exam Bladder: Nontender MUSCULOSKELETAL: Normal range of motion. No tenderness.  No cyanosis, clubbing, or edema.     Assessment:   1. Preoperative examination  2. Post-menopausal bleeding  3. Uterine leiomyoma, unspecified  location  4.  Cervical stenosis (uterine cervix)  5. Atypical glandular cells on Pap smear/endometrial biopsy      Plan:   Hysteroscopy/D&C  Prep calcium: Patient is to undergo hysteroscopy/D&C for evaluation of postmenopausal bleeding and atypical glandular cells found on endometrial biopsy. Patient is understanding of the planned procedure and is aware of and is accepting of all surgical risks which include but are not limited to bleeding, infection, pelvic organ injury with need for repair, blood clot disorders, anesthesia risks, etc. All questions have been answered. Informed consent is given. Patient is ready and willing to proceed with surgery as scheduled.  Brayton Mars, MD  Note: This dictation was prepared with Dragon dictation along with smaller phrase technology. Any transcriptional errors that result from this process are unintentional.          Office Visit on 12/28/2016        Detailed Report

## 2017-01-02 NOTE — Anesthesia Preprocedure Evaluation (Signed)
Anesthesia Evaluation  Patient identified by MRN, date of birth, ID band Patient awake    Reviewed: Allergy & Precautions, H&P , NPO status , Patient's Chart, lab work & pertinent test results  History of Anesthesia Complications Negative for: history of anesthetic complications  Airway Mallampati: II  TM Distance: >3 FB Neck ROM: limited    Dental  (+) Poor Dentition, Chipped, Caps   Pulmonary neg shortness of breath, sleep apnea ,    Pulmonary exam normal breath sounds clear to auscultation       Cardiovascular Exercise Tolerance: Good hypertension, (-) angina+ Peripheral Vascular Disease  (-) Past MI and (-) DOE Normal cardiovascular exam Rhythm:regular Rate:Normal     Neuro/Psych PSYCHIATRIC DISORDERS Anxiety Depression  Neuromuscular disease    GI/Hepatic Neg liver ROS, hiatal hernia, GERD  Controlled and Medicated,  Endo/Other  diabetes, Type 2  Renal/GU Renal disease     Musculoskeletal  (+) Arthritis ,   Abdominal   Peds  Hematology negative hematology ROS (+)   Anesthesia Other Findings Patient reports no problems with fentanyl  Past Medical History: No date: Allergy No date: Anxiety No date: Arthritis No date: Barrett's esophagus determined by endoscopy No date: Chronic kidney disease No date: Depression No date: Diabetes mellitus without complication ([facility]) No date: Diverticulosis No date: GERD (gastroesophageal reflux disease) No date: History of bronchitis No date: History of hiatal hernia No date: Hyperlipidemia No date: Hypertension No date: Sleep apnea     Comment: Use C- PAP No date: Urinary incontinence No date: Varicose veins  Past Surgical History: No date: CHOLECYSTECTOMY 06/14/2016: COLONOSCOPY WITH PROPOFOL N/A     Comment: Procedure: COLONOSCOPY WITH PROPOFOL;                Surgeon: Lollie Sails, MD;  Location: [facility]              ENDOSCOPY;  Service: Endoscopy;   Laterality:               N/A; 06/14/2016: ESOPHAGOGASTRODUODENOSCOPY (EGD) WITH PROPOFOL N/A     Comment: Procedure: ESOPHAGOGASTRODUODENOSCOPY (EGD)               WITH PROPOFOL;  Surgeon: Lollie Sails, MD;              Location[facility] ENDOSCOPY;  Service: Endoscopy;               Laterality: N/A; 2007: JOINT REPLACEMENT Right     Comment: Total Knee Replacement 2004: JOINT REPLACEMENT Left     Comment: Total Knee Replacement No date: ROTATOR CUFF REPAIR Right No date: TONSILLECTOMY     Comment: as a child No date: TUBAL LIGATION No date: UPPER GI ENDOSCOPY     Comment: x2  BMI    Body Mass Index:  34.01 kg/m      Reproductive/Obstetrics negative OB ROS                             Anesthesia Physical Anesthesia Plan  ASA: III  Anesthesia Plan: General LMA   Post-op Pain Management:    Induction:   Airway Management Planned:   Additional Equipment:   Intra-op Plan:   Post-operative Plan:   Informed Consent: I have reviewed the patients History and Physical, chart, labs and discussed the procedure including the risks, benefits and alternatives for the proposed anesthesia with the patient or authorized representative who has indicated his/her understanding and acceptance.  Dental Advisory Given  Plan Discussed with: Anesthesiologist, CRNA and Surgeon  Anesthesia Plan Comments:         Anesthesia Quick Evaluation

## 2017-01-02 NOTE — Interval H&P Note (Signed)
History and Physical Interval Note:  01/02/2017 11:37 AM  Sandra Hendrix  has presented today for surgery, with the diagnosis of PMB, UTERINE LEIOMYOMA  The various methods of treatment have been discussed with the patient and family. After consideration of risks, benefits and other options for treatment, the patient has consented to  Procedure(s): DILATATION AND CURETTAGE /HYSTEROSCOPY (N/A) as a surgical intervention .  The patient's history has been reviewed, patient examined, no change in status, stable for surgery.  I have reviewed the patient's chart and labs.  Questions were answered to the patient's satisfaction.     Hassell Done A Amarien Carne

## 2017-01-02 NOTE — Op Note (Signed)
OPERATIVE NOTE  Date of surgery: 01/02/2017  Preoperative diagnosis:  1. Postmenopausal bleeding 2. Atypical glandular cells on endometrial biopsy  Postoperative diagnosis:  1. Postmenopausal bleeding 2. Atypical glandular cells on endometrial biopsy 3. Endometrial polyps  Operative procedure: 1. Hysteroscopy 2. Dilation and curettage of endometrium  Surgeon: Dr. Zipporah Plants Assistant: None Anesthesia: Gen.   Findings: Pelvis: Gynecoid pelvis; uterine descensus to within 2 cm of the introitus; second-degree cystocele (excellent candidate for TVH or LAVH)   Uterus: sounded to 11 cm Multiple endometrial polyps were identified within the endometrial cavity  Description of procedure Patient was brought to the operating room where she was placed in supine position. General anesthesia was induced without difficulty using the LMA technique. The patient was placed in dorsal lithotomy position using candy cane stirrups. A betadine perineal intravaginal prep and drape was performed in standard fashion. Weighted speculum was placed in the vagina. Single-tooth tenaculum was placed on the anterior cervix. Uterus was sounded to 11 cm. Hanks dilators were used up to a # 98 French caliber to dilate the endocervical canal. The Myosure hysteroscope using lactated Ringer's as irrigant was used for the hysteroscopy. The above-noted findings were photo documented. The hysteroscope removed. The smooth and serrated curettes were then used to curettage the endometrial cavity with production of average tissue. Stone polyp forceps were used to grasp the endometrial polyps. Repeat hysteroscopy was performed and demonstrated excellent sampling. Procedure was then terminated with all instrumentation being removed from the vagina. The patient was then awakened mobilized and taken to the recovery room in satisfactory condition.  IV fluids: See anesthesia record Urine output: 25 mL. EBL: 25 mL.  Instruments,  needles, and sponge counts were verified as correct.  Brayton Mars, MD 01/02/2017

## 2017-01-03 ENCOUNTER — Encounter: Payer: Self-pay | Admitting: Obstetrics and Gynecology

## 2017-01-04 ENCOUNTER — Other Ambulatory Visit: Payer: Self-pay | Admitting: *Deleted

## 2017-01-04 LAB — SURGICAL PATHOLOGY

## 2017-01-11 ENCOUNTER — Ambulatory Visit (INDEPENDENT_AMBULATORY_CARE_PROVIDER_SITE_OTHER): Payer: Medicare Other | Admitting: Obstetrics and Gynecology

## 2017-01-11 ENCOUNTER — Encounter: Payer: Self-pay | Admitting: Obstetrics and Gynecology

## 2017-01-11 VITALS — BP 138/68 | HR 65 | Ht 61.0 in | Wt 181.7 lb

## 2017-01-11 DIAGNOSIS — N950 Postmenopausal bleeding: Secondary | ICD-10-CM

## 2017-01-11 DIAGNOSIS — D259 Leiomyoma of uterus, unspecified: Secondary | ICD-10-CM

## 2017-01-11 DIAGNOSIS — C55 Malignant neoplasm of uterus, part unspecified: Secondary | ICD-10-CM | POA: Diagnosis not present

## 2017-01-11 DIAGNOSIS — Z09 Encounter for follow-up examination after completed treatment for conditions other than malignant neoplasm: Secondary | ICD-10-CM | POA: Diagnosis not present

## 2017-01-11 NOTE — Progress Notes (Signed)
Chief complaint: 1. One week postop check 2. Status post hysteroscopy/D&C for postmenopausal bleeding and atypical glandular epithelium seen on endometrial biopsy  The patient presents for her postop check 10 days after surgery. She is still having some mild to moderate bleeding without significant pelvic. She is not requiring any analgesic therapy at this time. Bowel and bladder function are normal.  PATHOLOGY: DIAGNOSIS:  A. ENDOCERVIX; CURETTAGE:  - FRAGMENTS OF SQUAMOUS, ENDOCERVICAL, AND ENDOMETRIAL EPITHELIUM.  - NEGATIVE FOR ATYPIA AND MALIGNANCY.   B. ENDOMETRIUM; CURETTAGE:  - ATYPICAL HYPERPLASIA, WITH A FEW FRAGMENTS SUSPICIOUS FOR  WELL-DIFFERENTIATED ENDOMETRIOID ADENOCARCINOMA.   C. ENDOMETRIUM; POLYPECTOMY:  - WELL-DIFFERENTIATED ENDOMETRIOID ADENOCARCINOMA, FIGO I.  - ENDOMETRIAL POLYP FRAGMENTS WITH ATYPICAL HYPERPLASIA.   OBJECTIVE: BP 138/68   Pulse 65   Ht 5\' 1"  (1.549 m)   Wt 181 lb 11.2 oz (82.4 kg)   BMI 34.33 kg/m  Physical exam-deferred  ASSESSMENT: 1. Postmenopausal bleeding 2. Abnormal endometrial biopsy with atypical endometrium with glandular crowding 3. Pathology consistent with well-differentiated endometrioid adenocarcinoma from [facility]  PLAN: 1. Referral to [place] oncology for further surgical management   Brayton Mars, MD  Note: This dictation was prepared with Dragon dictation along with smaller phrase technology. Any transcriptional errors that result from this process are unintentional.

## 2017-01-11 NOTE — Patient Instructions (Signed)
1. Pathology from surgery-well-differentiated endometrioid adenocarcinoma of the endometrium (uterus) 2. Referral to GYN oncology-Dr. Theora Gianotti Uterine Cancer Uterine cancer is an abnormal growth of cancer tissue (malignant tumor) in the uterus. Unlike noncancerous (benign) tumors, malignant tumors can spread to other parts of the body. Uterine cancer usually occurs after menopause. However, it may also occur around the time that menopause begins. The wall of the uterus has an inner layer of tissue (endometrium) and an outer layer of muscle tissue (myometrium). The most common type of uterine cancer begins in the endometrium (endometrial cancer). Cancer that begins in the myometrium (uterine sarcoma) is very rare. What are the causes? The exact cause of this condition is not known. What increases the risk? You are more likely to develop this condition if you:  Are older than 50.  Have an enlarged endometrium (endometrial hyperplasia).  Use hormone therapy.  Are severely overweight (obese).  Use the medicine tamoxifen.  You are white (Caucasian).  Cannot bear children (are infertile).  Have never been pregnant.  Started menstruating at an age younger than 12 years.  Are older than 52 and are still having menstrual periods.  Have a history of cancer of the ovaries, intestines, or colon or rectum (colorectal cancer).  Have a history of enlarged ovaries with small cysts (polycystic ovarian syndrome).  Have a family history of:  Uterine cancer.  Hereditary nonpolyposis colon cancer (HNPCC).  Have diabetes, high blood pressure, thyroid disease, or gallbladder disease.  Use long-term, high-dose birth control pills.  Have been exposed to radiation.  Smoke. What are the signs or symptoms? Symptoms of this condition include:  Abnormal vaginal bleeding or discharge. Bleeding may start as a watery, blood-streaked flow that gradually contains more blood. This is the most common  symptom. If you experience abnormal vaginal bleeding, do not assume that it is part of menopause.  Vaginal bleeding after menopause.  Unexplained weight loss.  Bleeding between periods.  Urination that is difficult, painful, or more frequent than usual.  A lump (mass) in the vagina.  Pain, bloating, or fullness in the abdomen.  Pain in the pelvic area.  Pain during sex. How is this diagnosed? This condition may be diagnosed based on:  Your medical history and your symptoms.  A physical and pelvic exam. Your health care provider will feel your pelvis for any growths or enlarged lymph nodes.  Blood and urine tests.  Imaging tests, such as X-rays, CT scans, ultrasound, or MRIs.  A procedure in which a thin, flexible tube with a light and camera on the end is inserted through the vagina and used to look inside the uterus (hysteroscopy).  A Pap test to check for abnormal cells in the lower part of the uterus (cervix) and the upper vagina.  Removing a tissue sample (biopsy) from the uterine lining to check for cancer cells.  Dilation and curettage (D&C). This is a procedure that involves stretching (dilation) the cervix and scraping (curettage) the inside lining of the uterus to get a biopsy and check for cancer cells. Your cancer will be staged to determine its severity and extent. Staging is an assessment of:  The size of the tumor.  Whether the cancer has spread.  Where the cancer has spread. The stages of uterine cancer are as follows:  Stage I. The cancer is only found in the uterus.  Stage II. The cancer has spread to the cervix.  Stage III. The cancer has spread outside the uterus, but not outside the pelvis.  The cancer may have spread to the lymph nodes in the pelvis. Lymph nodes are part of your body's disease-fighting (immune) system. Lymph nodes are found in many locations in your body, including the neck, underarm, and groin.  Stage IV. The cancer has spread to  other parts of the body, such as the bladder or rectum. How is this treated? This condition is often treated with surgery to remove:  The uterus, cervix, fallopian tubes, and ovaries (total hysterectomy).  The uterus and cervix (simple hysterectomy). The type of hysterectomy you will have depends on the extent of your cancer. Lymph nodes near the uterus may also be removed in some cases. Treatment may also include one or more of the following:  Chemotherapy. This uses medicines to kill the cancer cells and prevent their spread.  Radiation therapy. This uses high-energy rays to kill the cancer cells and prevent the spread of cancer.  Chemoradiation. This is a combination treatment that alternates chemotherapy with radiation treatments to enhance the way radiation works.  Brachytherapy. This involves placing radioactive materials inside the body where the cancer was removed.  Hormone therapy. This includes taking medicines that lower the levels of estrogen in the body. Follow these instructions at home: Activity   Return to your normal activities as told by your health care provider. Ask your health care provider what activities are safe for you.  Exercise regularly as told by your health care provider.  Do not drive or use heavy machinery while taking prescription pain medicine. General instructions   Take over-the-counter and prescription medicines only as told by your health care provider.  Maintain a healthy diet.  Work with your health care provider to:  Manage any long-term (chronic) conditions you have, such as diabetes, high blood pressure, thyroid disease, or gallbladder disease.  Manage any side effects of your treatment.  Do not use any products that contain nicotine or tobacco, such as cigarettes and e-cigarettes. If you need help quitting, ask your health care provider.  Consider joining a support group to help you cope with stress. Your health care provider may be  able to recommend a local or online support group.  Keep all follow-up visits as told by your health care provider. This is important. Where to find more information:  American Cancer Society: https://www.cancer.[place] ([place]): https://www.cancer.gov Contact a health care provider if:  You have pain in your pelvis or abdomen that gets worse.  You cannot urinate.  You have abnormal bleeding.  You have a fever. Get help right away if:  You develop sudden or new severe symptoms, such as:  Heavy bleeding.  Severe weakness.  Pain that is severe or does not get better with medicine. Summary  Uterine cancer is an abnormal growth of cancer tissue (malignant tumor) in the uterus. The most common type of uterine cancer begins in the endometrium (endometrial cancer).  This condition is often treated with surgery to remove the uterus, cervix, fallopian tubes, and ovaries (total hysterectomy) or the uterus and cervix (simple hysterectomy).  Work with your health care provider to manage any long-term (chronic) conditions you have, such as diabetes, high blood pressure, thyroid disease, or gallbladder disease.  Consider joining a support group to help you cope with stress. Your health care provider may be able to recommend a local or online support group. This information is not intended to replace advice given to you by your health care provider. Make sure you discuss any questions you have with  your health care provider. Document Released: 09/12/2005 Document Revised: 09/09/2016 Document Reviewed: 09/09/2016 Elsevier Interactive Patient Education  2017 [employer].

## 2017-01-13 ENCOUNTER — Other Ambulatory Visit: Payer: Self-pay

## 2017-01-13 MED ORDER — LOSARTAN POTASSIUM-HCTZ 50-12.5 MG PO TABS: 1.0000 | ORAL_TABLET | Freq: Every day | ORAL | 0 refills | Status: DC

## 2017-01-18 ENCOUNTER — Other Ambulatory Visit: Payer: Self-pay

## 2017-01-19 DIAGNOSIS — C55 Malignant neoplasm of uterus, part unspecified: Secondary | ICD-10-CM | POA: Insufficient documentation

## 2017-01-19 HISTORY — DX: Malignant neoplasm of uterus, part unspecified: C55

## 2017-01-19 NOTE — Progress Notes (Signed)
  Oncology Nurse Navigator Documentation Received referral via inbox from Dr. Enzo Bi for GynOnc, Endometroid Adenocarcinoma. Called and spoke with Dr. Casimer Leek. Appointment arranged with Dr. Theora Gianotti for 5/2 at 10:00. Directions given and scheduling notified for new patient packet. Navigator Location: [facility] (01/19/17 0800)   )Navigator Encounter Type: Telephone (01/19/17 0800) Telephone: Lahoma Crocker Call;Appt Confirmation/Clarification (01/19/17 0800)                   Patient Visit Type: GynOnc (01/19/17 0800)                              Time Spent with Patient: 15 (01/19/17 0800)

## 2017-01-21 ENCOUNTER — Encounter: Payer: Self-pay | Admitting: Obstetrics and Gynecology

## 2017-01-25 ENCOUNTER — Inpatient Hospital Stay: Payer: Medicare Other

## 2017-01-25 ENCOUNTER — Inpatient Hospital Stay: Payer: Medicare Other | Attending: Obstetrics and Gynecology | Admitting: Obstetrics and Gynecology

## 2017-01-25 VITALS — BP 116/68 | HR 71 | Temp 98.3°F | Resp 18 | Ht 61.0 in | Wt 180.5 lb

## 2017-01-25 DIAGNOSIS — N183 Chronic kidney disease, stage 3 (moderate): Secondary | ICD-10-CM | POA: Diagnosis not present

## 2017-01-25 DIAGNOSIS — C541 Malignant neoplasm of endometrium: Secondary | ICD-10-CM

## 2017-01-25 DIAGNOSIS — Z7982 Long term (current) use of aspirin: Secondary | ICD-10-CM | POA: Insufficient documentation

## 2017-01-25 DIAGNOSIS — I129 Hypertensive chronic kidney disease with stage 1 through stage 4 chronic kidney disease, or unspecified chronic kidney disease: Secondary | ICD-10-CM | POA: Diagnosis not present

## 2017-01-25 DIAGNOSIS — I872 Venous insufficiency (chronic) (peripheral): Secondary | ICD-10-CM | POA: Insufficient documentation

## 2017-01-25 DIAGNOSIS — K581 Irritable bowel syndrome with constipation: Secondary | ICD-10-CM | POA: Diagnosis not present

## 2017-01-25 DIAGNOSIS — N882 Stricture and stenosis of cervix uteri: Secondary | ICD-10-CM | POA: Diagnosis not present

## 2017-01-25 DIAGNOSIS — K449 Diaphragmatic hernia without obstruction or gangrene: Secondary | ICD-10-CM | POA: Diagnosis not present

## 2017-01-25 DIAGNOSIS — E785 Hyperlipidemia, unspecified: Secondary | ICD-10-CM | POA: Diagnosis not present

## 2017-01-25 DIAGNOSIS — Z6834 Body mass index (BMI) 34.0-34.9, adult: Secondary | ICD-10-CM | POA: Insufficient documentation

## 2017-01-25 DIAGNOSIS — Z79899 Other long term (current) drug therapy: Secondary | ICD-10-CM | POA: Insufficient documentation

## 2017-01-25 DIAGNOSIS — R32 Unspecified urinary incontinence: Secondary | ICD-10-CM | POA: Insufficient documentation

## 2017-01-25 DIAGNOSIS — E1122 Type 2 diabetes mellitus with diabetic chronic kidney disease: Secondary | ICD-10-CM | POA: Insufficient documentation

## 2017-01-25 DIAGNOSIS — Z7984 Long term (current) use of oral hypoglycemic drugs: Secondary | ICD-10-CM | POA: Insufficient documentation

## 2017-01-25 DIAGNOSIS — G4733 Obstructive sleep apnea (adult) (pediatric): Secondary | ICD-10-CM | POA: Insufficient documentation

## 2017-01-25 DIAGNOSIS — E669 Obesity, unspecified: Secondary | ICD-10-CM | POA: Diagnosis not present

## 2017-01-25 DIAGNOSIS — K2270 Barrett's esophagus without dysplasia: Secondary | ICD-10-CM | POA: Insufficient documentation

## 2017-01-25 LAB — CBC WITH DIFFERENTIAL/PLATELET
Basophils Absolute: 0 10*3/uL (ref 0–0.1)
Basophils Relative: 1 %
Eosinophils Absolute: 0.2 10*3/uL (ref 0–0.7)
Eosinophils Relative: 3 %
HCT: 38.4 % (ref 35.0–47.0)
Hemoglobin: 13.1 g/dL (ref 12.0–16.0)
Lymphocytes Relative: 25 %
Lymphs Abs: 1.3 10*3/uL (ref 1.0–3.6)
MCH: 30.1 pg (ref 26.0–34.0)
MCHC: 34.2 g/dL (ref 32.0–36.0)
MCV: 88 fL (ref 80.0–100.0)
Monocytes Absolute: 0.4 10*3/uL (ref 0.2–0.9)
Monocytes Relative: 7 %
Neutro Abs: 3.5 10*3/uL (ref 1.4–6.5)
Neutrophils Relative %: 64 %
Platelets: 274 10*3/uL (ref 150–440)
RBC: 4.36 MIL/uL (ref 3.80–5.20)
RDW: 14.7 % — ABNORMAL HIGH (ref 11.5–14.5)
WBC: 5.4 10*3/uL (ref 3.6–11.0)

## 2017-01-25 LAB — COMPREHENSIVE METABOLIC PANEL
ALT: 19 U/L (ref 14–54)
AST: 23 U/L (ref 15–41)
Albumin: 4.3 g/dL (ref 3.5–5.0)
Alkaline Phosphatase: 125 U/L (ref 38–126)
Anion gap: 7 (ref 5–15)
BUN: 28 mg/dL — ABNORMAL HIGH (ref 6–20)
CO2: 29 mmol/L (ref 22–32)
Calcium: 9.9 mg/dL (ref 8.9–10.3)
Chloride: 102 mmol/L (ref 101–111)
Creatinine, Ser: 1.03 mg/dL — ABNORMAL HIGH (ref 0.44–1.00)
GFR calc Af Amer: >60 mL/min (ref >60)
GFR calc non Af Amer: 53 mL/min — ABNORMAL LOW (ref >60)
Glucose, Bld: 171 mg/dL — ABNORMAL HIGH (ref 65–99)
Potassium: 3.8 mmol/L (ref 3.5–5.1)
Sodium: 138 mmol/L (ref 135–145)
Total Bilirubin: 0.5 mg/dL (ref 0.3–1.2)
Total Protein: 7.8 g/dL (ref 6.5–8.1)

## 2017-01-25 NOTE — Progress Notes (Signed)
  Oncology Nurse Navigator Documentation Pre and post op teaching completed for 02/01/17 surgery. Will follow up 4-6 weeks after surgery. Will contact regarding preop appt. Navigator Location: [facility] (01/25/17 1000)   )Navigator Encounter Type: Initial GynOnc (01/25/17 1000)                     Patient Visit Type: GynOnc (01/25/17 1000)                              Time Spent with Patient: 60 (01/25/17 1000)

## 2017-01-25 NOTE — H&P (Signed)
Gynecologic Oncology Consult Visit   Referring Provider: Brayton Mars, MD    Chief Concern: WELL-DIFFERENTIATED ENDOMETRIOID ADENOCARCINOMA, FIGO I, of the endometrium  Subjective:  Sandra Hendrix is a 71 y.o. female who is seen in consultation from Dr. Enzo Bi for grade 1 endometrial cancer.  She presented with postmenopausal bleeding. Her evaluation included Pap on 10/17/2016 with endometrial cells present otherwise NILM; and on 11/16/2016 endometrial biopsy that revealed atypical glandular epithelium.   Her evaluation also included pelvic US 10/17/2016  FINDINGS: Uterus: Measurements: 7.8 x 4.8 x 6.2 cm. Large 5.8 x 4.3 x 5.3 cm mass is noted within the a uterine body. Echodensities consistent calcifications noted within the mass. Finding most consistent with a fibroid. Endometrium: Thickness: Could not identified due to large uterine mass. Right ovary: Measurements: 2.6 x 1.4 x 1.4 cm. Normal appearance/no adnexal mass. Left ovary: Measurements: 4.6 x 2.4 x 4.4 cm. Normal appearance/no adnexal mass.4.3 x 2.0 x 3.9 cm simple cyst left ovary. Similar finding noted on prior CT performed on 02/21/2015 (3.4 cm simple cyst).  She underwent hysteroscopy/D&C on 01/02/2017 that revealed grade 1 endometrioid endometrial cancer.  PATHOLOGY: DIAGNOSIS:  A. ENDOCERVIX; CURETTAGE:  - FRAGMENTS OF SQUAMOUS, ENDOCERVICAL, AND ENDOMETRIAL EPITHELIUM.  - NEGATIVE FOR ATYPIA AND MALIGNANCY.   B. ENDOMETRIUM; CURETTAGE:  - ATYPICAL HYPERPLASIA, WITH A FEW FRAGMENTS SUSPICIOUS FOR  WELL-DIFFERENTIATED ENDOMETRIOID ADENOCARCINOMA.   C. ENDOMETRIUM; POLYPECTOMY:  - WELL-DIFFERENTIATED ENDOMETRIOID ADENOCARCINOMA, FIGO I.  - ENDOMETRIAL POLYP FRAGMENTS WITH ATYPICAL HYPERPLASIA.   She presents for evaluation.    Problem List: Patient Active Problem List   Diagnosis Date Noted  . Endometrial cancer ([place]) 01/25/2017  . Endometrioid adenocarcinoma of uterus ([place]) 01/19/2017  .  Status post D&C 01/02/2017  . OSA on CPAP 11/24/2016  . Cyst of ovary 11/16/2016  . Uterine leiomyoma 11/16/2016  . Cervical stenosis (uterine cervix) 11/16/2016  . Post-menopausal bleeding 10/17/2016  . Snoring 07/31/2016  . Cyst, vulva 10/11/2015  . Mass of left breast 03/04/2015  . CKD (chronic kidney disease) stage 3, GFR 30-59 ml/min 11/07/2014  . Acid reflux 12/26/2013  . Irritable bowel syndrome with constipation 12/26/2013  . Hyperlipidemia LDL goal <100 07/15/2013  . Obesity 07/15/2013  . Chronic venous insufficiency 03/21/2013  . DM (diabetes mellitus), type 2 with renal complications ([place]) 96/12/5407  . Lack of libido 11/05/2012  . Barrett's esophagus 11/05/2012  . Hiatal hernia 11/05/2012  . Essential hypertension 11/05/2012  . Urinary incontinence     Past Medical History: Past Medical History:  Diagnosis Date  . Allergy   . Anxiety   . Arthritis   . Barrett's esophagus determined by endoscopy   . Chronic kidney disease   . Depression   . Diabetes mellitus without complication ([place])   . Diverticulosis   . GERD (gastroesophageal reflux disease)   . History of bronchitis   . History of hiatal hernia   . Hyperlipidemia   . Hypertension   . Sleep apnea    Use C- PAP  . Urinary incontinence   . Varicose veins     Past Surgical History: Past Surgical History:  Procedure Laterality Date  . CHOLECYSTECTOMY    . COLONOSCOPY WITH PROPOFOL N/A 06/14/2016   Procedure: COLONOSCOPY WITH PROPOFOL;  Surgeon: Lollie Sails, MD;  Location: [facility] ENDOSCOPY;  Service: Endoscopy;  Laterality: N/A;  . ESOPHAGOGASTRODUODENOSCOPY (EGD) WITH PROPOFOL N/A 06/14/2016   Procedure: ESOPHAGOGASTRODUODENOSCOPY (EGD) WITH PROPOFOL;  Surgeon: Lollie Sails, MD;  Location: [facility] ENDOSCOPY;  Service: Endoscopy;  Laterality: N/A;  . HYSTEROSCOPY W/D&C N/A 01/02/2017   Procedure: DILATATION AND CURETTAGE /HYSTEROSCOPY;  Surgeon: Brayton Mars, MD;  Location: [facility] ORS;  Service:  Gynecology;  Laterality: N/A;  . JOINT REPLACEMENT Right 2007   Total Knee Replacement  . JOINT REPLACEMENT Left 2004   Total Knee Replacement  . ROTATOR CUFF REPAIR Right   . TONSILLECTOMY     as a child  . TUBAL LIGATION    . UPPER GI ENDOSCOPY     x2    Past Gynecologic History:  Patient is postmenopausal. Menarche: 8 History of Abnormal pap: yes, endometrial cells o/w NILM Last pap: 2018 as per interval history  Last Pap:10/17/2016 negative for intraepithelial lesions or malignancy. Benign reparative/reactive changes. ENDOMETRIAL CELLS PRESENT Sexually active: no  She is up to date on her mammograms, 2017 per her report  OB History:  OB History  Gravida Para Term Preterm AB Living  2 2 2     2   SAB TAB Ectopic Multiple Live Births          2    # Outcome Date GA Lbr Len/2nd Weight Sex Delivery Anes PTL Lv  2 Term 1972   9 lb (4.082 kg) F Vag-Spont   LIV  1 Term 1970   9 lb (4.082 kg) F Vag-Spont   LIV      Family History: Family History  Problem Relation Age of Onset  . Stroke Mother   . Hypertension Mother   . Lupus Mother   . Alcohol abuse Father   . Diabetes Father   . Cancer Father     gallbladder ca into liver   . Arthritis Sister   . Diabetes Sister   . Hyperlipidemia Daughter   . Hypertension Daughter   . Cancer Maternal Aunt     ovarian ca  . Diabetes Paternal Aunt   . Drug abuse Paternal Aunt   . Cancer Maternal Grandfather     colon ca    Social History: Social History   Social History  . Marital status: Married    Spouse name: N/A  . Number of children: N/A  . Years of education: N/A   Occupational History  . Not on file.   Social History Main Topics  . Smoking status: Never Smoker  . Smokeless tobacco: Never Used  . Alcohol use No  . Drug use: No  . Sexual activity: Yes     Comment: not often   Other Topics Concern  . Not on file   Social History Narrative  . No narrative on file    Allergies: Allergies  Allergen  Reactions  . Clindamycin/Lincomycin Rash  . Codeine Rash  . Morphine And Related Nausea Only  . Talwin [Pentazocine] Nausea And Vomiting  . Lisinopril Cough    cough  . Oxycodone-Acetaminophen Other (See Comments)    Pt states this medication makes her bowels stop moving  . Cleocin [Clindamycin Hcl] Rash    Current Medications: Current Outpatient Prescriptions  Medication Sig Dispense Refill  . ACCU-CHEK AVIVA PLUS test strip Test once daily 100 each 2  . albuterol (PROVENTIL HFA;VENTOLIN HFA) 108 (90 BASE) MCG/ACT inhaler Inhale 2 puffs into the lungs every 6 (six) hours as needed for wheezing.    . Ascorbic Acid (VITAMIN C) 1000 MG tablet Take 1,000 mg by mouth daily.     Marland Kitchen aspirin EC 81 MG tablet Take 81 mg by mouth every evening.     Marland Kitchen atorvastatin (LIPITOR) 20 MG  tablet Take 20 mg by mouth daily.    Marland Kitchen azelastine (ASTEPRO) 137 MCG/SPRAY nasal spray Place 1 spray into the nose 2 (two) times daily. Use in each nostril as directed (Patient taking differently: Place 1 spray into the nose daily. Use in each nostril as directed) 30 mL 11  . BIOTIN PO Take 1 tablet by mouth 2 (two) times daily. Take 1000mg  by mouth twice daily.     . Cholecalciferol (D3-1000 PO) Take 1,000 Int'l Units by mouth every morning.     Marland Kitchen CINNAMON PO Take 1,000 mg by mouth 2 (two) times daily.    . fish oil-omega-3 fatty acids 1000 MG capsule Take 1 capsule by mouth daily.    . fluticasone (FLONASE) 50 MCG/ACT nasal spray Place 1 spray into both nostrils daily. 48 g 1  . furosemide (LASIX) 20 MG tablet Take 1 tablet by mouth  every other day 45 tablet 2  . glipiZIDE (GLUCOTROL XL) 10 MG 24 hr tablet Take 1 tablet by mouth  daily 90 tablet 2  . Lancet Devices (ACCU-CHEK SOFTCLIX) lancets Use to test blood sugars 1 -2 times daily 1 each 3  . loratadine (CLARITIN) 10 MG tablet Take 10 mg by mouth daily.    Marland Kitchen losartan-hydrochlorothiazide (HYZAAR) 50-12.5 MG tablet Take 1 tablet by mouth daily. 90 tablet 0  .  Meclizine HCl 25 MG CHEW Chew 25 mg by mouth daily as needed (for vertigo).     . metFORMIN (GLUCOPHAGE-XR) 750 MG 24 hr tablet Take 2 tablets by mouth  daily with breakfast 180 tablet 2  . Multiple Vitamin (MULTIVITAMIN) tablet Take 1 tablet by mouth daily.    Marland Kitchen omeprazole (PRILOSEC) 20 MG capsule Take 1 capsule by mouth  daily 90 capsule 2  . Potassium 99 MG TABS Take 1 tablet by mouth daily.    . Probiotic Product (PROBIOTIC-10) CAPS Take 1 capsule by mouth daily.     Marland Kitchen senna-docusate (SENOKOT-S) 8.6-50 MG per tablet Take 1 tablet by mouth daily. 30 tablet 0  . sertraline (ZOLOFT) 50 MG tablet Take 1 tablet by mouth  daily 90 tablet 2  . sitaGLIPtin (JANUVIA) 50 MG tablet One tablet daily    . verapamil (VERELAN PM) 180 MG 24 hr capsule Take 1 capsule by mouth  daily 90 capsule 2   No current facility-administered medications for this visit.     Review of Systems General: positive for weight gain, negative for, fevers Skin: negative for changes in color, texture, moles or lesions Eyes: negative for, changes in vision HEENT: positive for hearing loss, tinnitus and voice change, negative for visual trouble. She believes the hearing loss and tinnitus is due to diabetes. The voice change she attributes to GERD.  Pulmonary: negative for, dyspnea, productive cough Cardiac: negative for, palpitations, pain Gastrointestinal: negative for, nausea, vomiting, constipation, diarrhea, hematemesis, hematochezia Genitourinary/Sexual: positive for hematuria, urinary incontinence and urinary retention. She wears a pad daily and incontinence occurs with stress or anytime. She had SVD x 2 and babies weighed ~ 8 pounds.  Ob/Gyn: irregular bleeding Musculoskeletal: negative for, pain Hematology: negative for, easy bruising Neurologic/Psych: negative for, headaches, seizures, paralysis, weakness  Objective:  Physical Examination:  BP 116/68   Pulse 71   Temp 98.3 F (36.8 C) (Oral)   Resp 18   Ht 5'  1" (1.549 m)   Wt 180 lb 8 oz (81.9 kg)   BMI 34.11 kg/m    ECOG Performance Status: 0 - Asymptomatic  General appearance: alert,  cooperative and appears stated age HEENT:PERRLA, extra ocular movement intact and sclera clear, anicteric Lymph node survey: non-palpable, axillary, inguinal, supraclavicular Cardiovascular: regular rate and rhythm Respiratory: normal air entry, lungs clear to auscultation Abdomen: soft, non-tender, without masses or organomegaly, no hernias and well healed incisions in the RUQ and the umbilicus Back: inspection of back is normal Extremities: extremities normal, atraumatic, no cyanosis or edema Skin exam - normal coloration and turgor, no rashes, no suspicious skin lesions noted. Neurological exam reveals alert, oriented, normal speech, no focal findings or movement disorder noted.  Pelvic: exam chaperoned by nurse;  Vulva: normal appearing vulva with no masses, tenderness or lesions; Vagina: normal vagina except for anterior wall prolapse with prolapse to the introitus with strain; Adnexa: no masses; Uterus: uterus larger than expected based on ultrasound (8-10 week size), normal shape,and slightly tender, exam limited by habitus; Cervix: multiparous appearance, no CMT; Rectal: not indicated   Lab Review Labs on site today: CMP, CBC, HbA1c   Radiologic Imaging: CXR PA and Lateral pending.     Assessment:  Keriann P Fjelstad is a 71 y.o. female diagnosed with grade 1 endometrioid endometrial cancer. Most likely benign ovarian cyst. Urinary incontinence.   Medical co-morbidities complicating care: OSA on CPAP, HTN, diabetes, CKD and obesity (with Body mass index is 34.11 kg/m.)  Plan:   Problem List Items Addressed This Visit      Genitourinary   Endometrial cancer ([place]) - Primary      We discussed options for management of endometrial cancer. Differences in clinical and molecular phenotypes between type I and type II uterine cancers were discussed,  emphasizing that the patient appears to have the former, which generally is associated with excess unopposed estrogen exposure.  These cancers tend to be well-differentiated, demonstrate favorable endometrioid histology, are often found within a background of concurrent premalignant atypical hyperplasia, usually express ER/PR, are typically minimally invasive, rarely metastatic, associated with an excellent prognosis after hysterectomy, and only occasionally require postoperative adjuvant therapy.  As such, surgical removal of the uterus and adnexae remains the cornerstone of therapy in all but the most medically infirm. We will often do assessment of lymph nodes with sentinel lymph node injection, mapping, and biopsy. Alternatives to surgery include radiation therapy or hormonal therapy, but that is not our recommendation.   Plan to proceed with TLH, BSO, SLN injection, mapping, and biopsy, possible LND with Dr. Fransisca Connors and either Dr. Enzo Bi or one of his colleagues on 02/01/2017. Labs including HbA1c, CXR, and EKG ordered.   Risks were discussed in detail. These include infection, anesthesia, bleeding, transfusion, wound separation, vaginal cuff dehiscence, medical issues (blood clots, stroke, heart attack, fluid in the lungs, pneumonia, abnormal heart rhythm, death), possible exploratory surgery with larger incision, lymphedema, lymphocyst, allergic reaction, injury to adjacent organs (bowel, bladder, blood vessels, nerves, ureters).   We recommended stopping ASA and omega 3 fatty acids as they may increase bleeding.  Urinary incontinence- we discussed options for management including Urology or Urogynecology referral prior to surgery vs completing her surgery for endometrial cancer. She would prefer to proceed with endometrial cancer surgery and then follow up with consultation later. She understands that she may require 2 surgical procedures rather than one with this approach, but that we can treat  the endometrial cancer sooner.   Suggested return to clinic in  4 weeks postoperatively to review pathology and perform postoperative exam. .    The patient's diagnosis, an outline of the further diagnostic and laboratory studies which  will be required, the recommendation, and alternatives were discussed.  All questions were answered to the patient's satisfaction.  A total of 60 minutes were spent with the patient/family today; 50% was spent in education, counseling and coordination of care for endometrial cancer and ovarian cyst.    Gillis Ends, MD    CC:  RMD: Brayton Mars, MD  PCP: Crecencio Mc, MD [address], [place] [address] [phone]

## 2017-01-25 NOTE — Progress Notes (Signed)
Gynecologic Oncology Consult Visit   Referring Provider: Brayton Mars, MD    Chief Concern: WELL-DIFFERENTIATED ENDOMETRIOID ADENOCARCINOMA, FIGO I, of the endometrium  Subjective:  Sandra Hendrix is a 71 y.o. female who is seen in consultation from Dr. Enzo Bi for grade 1 endometrial cancer.  She presented with postmenopausal bleeding. Her evaluation included Pap on 10/17/2016 with endometrial cells present otherwise NILM; and on 11/16/2016 endometrial biopsy that revealed atypical glandular epithelium.   Her evaluation also included pelvic US 10/17/2016  FINDINGS: Uterus: Measurements: 7.8 x 4.8 x 6.2 cm. Large 5.8 x 4.3 x 5.3 cm mass is noted within the a uterine body. Echodensities consistent calcifications noted within the mass. Finding most consistent with a fibroid. Endometrium: Thickness: Could not identified due to large uterine mass. Right ovary: Measurements: 2.6 x 1.4 x 1.4 cm. Normal appearance/no adnexal mass. Left ovary: Measurements: 4.6 x 2.4 x 4.4 cm. Normal appearance/no adnexal mass.4.3 x 2.0 x 3.9 cm simple cyst left ovary. Similar finding noted on prior CT performed on 02/21/2015 (3.4 cm simple cyst).  She underwent hysteroscopy/D&C on 01/02/2017 that revealed grade 1 endometrioid endometrial cancer.  PATHOLOGY: DIAGNOSIS:  A. ENDOCERVIX; CURETTAGE:  - FRAGMENTS OF SQUAMOUS, ENDOCERVICAL, AND ENDOMETRIAL EPITHELIUM.  - NEGATIVE FOR ATYPIA AND MALIGNANCY.   B. ENDOMETRIUM; CURETTAGE:  - ATYPICAL HYPERPLASIA, WITH A FEW FRAGMENTS SUSPICIOUS FOR  WELL-DIFFERENTIATED ENDOMETRIOID ADENOCARCINOMA.   C. ENDOMETRIUM; POLYPECTOMY:  - WELL-DIFFERENTIATED ENDOMETRIOID ADENOCARCINOMA, FIGO I.  - ENDOMETRIAL POLYP FRAGMENTS WITH ATYPICAL HYPERPLASIA.   She presents for evaluation.    Problem List: Patient Active Problem List   Diagnosis Date Noted  . Endometrial cancer ([place]) 01/25/2017  . Endometrioid adenocarcinoma of uterus ([place]) 01/19/2017  .  Status post D&C 01/02/2017  . OSA on CPAP 11/24/2016  . Cyst of ovary 11/16/2016  . Uterine leiomyoma 11/16/2016  . Cervical stenosis (uterine cervix) 11/16/2016  . Post-menopausal bleeding 10/17/2016  . Snoring 07/31/2016  . Cyst, vulva 10/11/2015  . Mass of left breast 03/04/2015  . CKD (chronic kidney disease) stage 3, GFR 30-59 ml/min 11/07/2014  . Acid reflux 12/26/2013  . Irritable bowel syndrome with constipation 12/26/2013  . Hyperlipidemia LDL goal <100 07/15/2013  . Obesity 07/15/2013  . Chronic venous insufficiency 03/21/2013  . DM (diabetes mellitus), type 2 with renal complications ([place]) 16/09/930  . Lack of libido 11/05/2012  . Barrett's esophagus 11/05/2012  . Hiatal hernia 11/05/2012  . Essential hypertension 11/05/2012  . Urinary incontinence     Past Medical History: Past Medical History:  Diagnosis Date  . Allergy   . Anxiety   . Arthritis   . Barrett's esophagus determined by endoscopy   . Chronic kidney disease   . Depression   . Diabetes mellitus without complication ([place])   . Diverticulosis   . GERD (gastroesophageal reflux disease)   . History of bronchitis   . History of hiatal hernia   . Hyperlipidemia   . Hypertension   . Sleep apnea    Use C- PAP  . Urinary incontinence   . Varicose veins     Past Surgical History: Past Surgical History:  Procedure Laterality Date  . CHOLECYSTECTOMY    . COLONOSCOPY WITH PROPOFOL N/A 06/14/2016   Procedure: COLONOSCOPY WITH PROPOFOL;  Surgeon: Lollie Sails, MD;  Location[facility] ENDOSCOPY;  Service: Endoscopy;  Laterality: N/A;  . ESOPHAGOGASTRODUODENOSCOPY (EGD) WITH PROPOFOL N/A 06/14/2016   Procedure: ESOPHAGOGASTRODUODENOSCOPY (EGD) WITH PROPOFOL;  Surgeon: Lollie Sails, MD;  Location: [facility] ENDOSCOPY;  Service: Endoscopy;  Laterality: N/A;  . HYSTEROSCOPY W/D&C N/A 01/02/2017   Procedure: DILATATION AND CURETTAGE /HYSTEROSCOPY;  Surgeon: Brayton Mars, MD;  Location: [facility] ORS;  Service:  Gynecology;  Laterality: N/A;  . JOINT REPLACEMENT Right 2007   Total Knee Replacement  . JOINT REPLACEMENT Left 2004   Total Knee Replacement  . ROTATOR CUFF REPAIR Right   . TONSILLECTOMY     as a child  . TUBAL LIGATION    . UPPER GI ENDOSCOPY     x2    Past Gynecologic History:  Patient is postmenopausal. Menarche: 8 History of Abnormal pap: yes, endometrial cells o/w NILM Last pap: 2018 as per interval history  Last Pap:10/17/2016 negative for intraepithelial lesions or malignancy. Benign reparative/reactive changes. ENDOMETRIAL CELLS PRESENT Sexually active: no  She is up to date on her mammograms, 2017 per her report  OB History:  OB History  Gravida Para Term Preterm AB Living  2 2 2     2   SAB TAB Ectopic Multiple Live Births          2    # Outcome Date GA Lbr Len/2nd Weight Sex Delivery Anes PTL Lv  2 Term 1972   9 lb (4.082 kg) F Vag-Spont   LIV  1 Term 1970   9 lb (4.082 kg) F Vag-Spont   LIV      Family History: Family History  Problem Relation Age of Onset  . Stroke Mother   . Hypertension Mother   . Lupus Mother   . Alcohol abuse Father   . Diabetes Father   . Cancer Father     gallbladder ca into liver   . Arthritis Sister   . Diabetes Sister   . Hyperlipidemia Daughter   . Hypertension Daughter   . Cancer Maternal Aunt     ovarian ca  . Diabetes Paternal Aunt   . Drug abuse Paternal Aunt   . Cancer Maternal Grandfather     colon ca    Social History: Social History   Social History  . Marital status: Married    Spouse name: N/A  . Number of children: N/A  . Years of education: N/A   Occupational History  . Not on file.   Social History Main Topics  . Smoking status: Never Smoker  . Smokeless tobacco: Never Used  . Alcohol use No  . Drug use: No  . Sexual activity: Yes     Comment: not often   Other Topics Concern  . Not on file   Social History Narrative  . No narrative on file    Allergies: Allergies  Allergen  Reactions  . Clindamycin/Lincomycin Rash  . Codeine Rash  . Morphine And Related Nausea Only  . Talwin [Pentazocine] Nausea And Vomiting  . Lisinopril Cough    cough  . Oxycodone-Acetaminophen Other (See Comments)    Pt states this medication makes her bowels stop moving  . Cleocin [Clindamycin Hcl] Rash    Current Medications: Current Outpatient Prescriptions  Medication Sig Dispense Refill  . ACCU-CHEK AVIVA PLUS test strip Test once daily 100 each 2  . albuterol (PROVENTIL HFA;VENTOLIN HFA) 108 (90 BASE) MCG/ACT inhaler Inhale 2 puffs into the lungs every 6 (six) hours as needed for wheezing.    . Ascorbic Acid (VITAMIN C) 1000 MG tablet Take 1,000 mg by mouth daily.     Marland Kitchen aspirin EC 81 MG tablet Take 81 mg by mouth every evening.     Marland Kitchen atorvastatin (LIPITOR) 20 MG  tablet Take 20 mg by mouth daily.    Marland Kitchen azelastine (ASTEPRO) 137 MCG/SPRAY nasal spray Place 1 spray into the nose 2 (two) times daily. Use in each nostril as directed (Patient taking differently: Place 1 spray into the nose daily. Use in each nostril as directed) 30 mL 11  . BIOTIN PO Take 1 tablet by mouth 2 (two) times daily. Take 1000mg  by mouth twice daily.     . Cholecalciferol (D3-1000 PO) Take 1,000 Int'l Units by mouth every morning.     Marland Kitchen CINNAMON PO Take 1,000 mg by mouth 2 (two) times daily.    . fish oil-omega-3 fatty acids 1000 MG capsule Take 1 capsule by mouth daily.    . fluticasone (FLONASE) 50 MCG/ACT nasal spray Place 1 spray into both nostrils daily. 48 g 1  . furosemide (LASIX) 20 MG tablet Take 1 tablet by mouth  every other day 45 tablet 2  . glipiZIDE (GLUCOTROL XL) 10 MG 24 hr tablet Take 1 tablet by mouth  daily 90 tablet 2  . Lancet Devices (ACCU-CHEK SOFTCLIX) lancets Use to test blood sugars 1 -2 times daily 1 each 3  . loratadine (CLARITIN) 10 MG tablet Take 10 mg by mouth daily.    Marland Kitchen losartan-hydrochlorothiazide (HYZAAR) 50-12.5 MG tablet Take 1 tablet by mouth daily. 90 tablet 0  .  Meclizine HCl 25 MG CHEW Chew 25 mg by mouth daily as needed (for vertigo).     . metFORMIN (GLUCOPHAGE-XR) 750 MG 24 hr tablet Take 2 tablets by mouth  daily with breakfast 180 tablet 2  . Multiple Vitamin (MULTIVITAMIN) tablet Take 1 tablet by mouth daily.    Marland Kitchen omeprazole (PRILOSEC) 20 MG capsule Take 1 capsule by mouth  daily 90 capsule 2  . Potassium 99 MG TABS Take 1 tablet by mouth daily.    . Probiotic Product (PROBIOTIC-10) CAPS Take 1 capsule by mouth daily.     Marland Kitchen senna-docusate (SENOKOT-S) 8.6-50 MG per tablet Take 1 tablet by mouth daily. 30 tablet 0  . sertraline (ZOLOFT) 50 MG tablet Take 1 tablet by mouth  daily 90 tablet 2  . sitaGLIPtin (JANUVIA) 50 MG tablet One tablet daily    . verapamil (VERELAN PM) 180 MG 24 hr capsule Take 1 capsule by mouth  daily 90 capsule 2   No current facility-administered medications for this visit.     Review of Systems General: positive for weight gain, negative for, fevers Skin: negative for changes in color, texture, moles or lesions Eyes: negative for, changes in vision HEENT: positive for hearing loss, tinnitus and voice change, negative for visual trouble. She believes the hearing loss and tinnitus is due to diabetes. The voice change she attributes to GERD.  Pulmonary: negative for, dyspnea, productive cough Cardiac: negative for, palpitations, pain Gastrointestinal: negative for, nausea, vomiting, constipation, diarrhea, hematemesis, hematochezia Genitourinary/Sexual: positive for hematuria, urinary incontinence and urinary retention. She wears a pad daily and incontinence occurs with stress or anytime. She had SVD x 2 and babies weighed ~ 8 pounds.  Ob/Gyn: irregular bleeding Musculoskeletal: negative for, pain Hematology: negative for, easy bruising Neurologic/Psych: negative for, headaches, seizures, paralysis, weakness  Objective:  Physical Examination:  BP 116/68   Pulse 71   Temp 98.3 F (36.8 C) (Oral)   Resp 18   Ht 5'  1" (1.549 m)   Wt 180 lb 8 oz (81.9 kg)   BMI 34.11 kg/m    ECOG Performance Status: 0 - Asymptomatic  General appearance: alert,  cooperative and appears stated age HEENT:PERRLA, extra ocular movement intact and sclera clear, anicteric Lymph node survey: non-palpable, axillary, inguinal, supraclavicular Cardiovascular: regular rate and rhythm Respiratory: normal air entry, lungs clear to auscultation Abdomen: soft, non-tender, without masses or organomegaly, no hernias and well healed incisions in the RUQ and the umbilicus Back: inspection of back is normal Extremities: extremities normal, atraumatic, no cyanosis or edema Skin exam - normal coloration and turgor, no rashes, no suspicious skin lesions noted. Neurological exam reveals alert, oriented, normal speech, no focal findings or movement disorder noted.  Pelvic: exam chaperoned by nurse;  Vulva: normal appearing vulva with no masses, tenderness or lesions; Vagina: normal vagina except for anterior wall prolapse with prolapse to the introitus with strain; Adnexa: no masses; Uterus: uterus larger than expected based on ultrasound (8-10 week size), normal shape,and slightly tender, exam limited by habitus; Cervix: multiparous appearance, no CMT; Rectal: not indicated   Lab Review Labs on site today: CMP, CBC, HbA1c   Radiologic Imaging: CXR PA and Lateral pending.     Assessment:  Sandra Hendrix is a 71 y.o. female diagnosed with grade 1 endometrioid endometrial cancer. Most likely benign ovarian cyst. Urinary incontinence.   Medical co-morbidities complicating care: OSA on CPAP, HTN, diabetes, CKD and obesity (with Body mass index is 34.11 kg/m.)  Plan:   Problem List Items Addressed This Visit      Genitourinary   Endometrial cancer ([place]) - Primary      We discussed options for management of endometrial cancer. Differences in clinical and molecular phenotypes between type I and type II uterine cancers were discussed,  emphasizing that the patient appears to have the former, which generally is associated with excess unopposed estrogen exposure.  These cancers tend to be well-differentiated, demonstrate favorable endometrioid histology, are often found within a background of concurrent premalignant atypical hyperplasia, usually express ER/PR, are typically minimally invasive, rarely metastatic, associated with an excellent prognosis after hysterectomy, and only occasionally require postoperative adjuvant therapy.  As such, surgical removal of the uterus and adnexae remains the cornerstone of therapy in all but the most medically infirm. We will often do assessment of lymph nodes with sentinel lymph node injection, mapping, and biopsy. Alternatives to surgery include radiation therapy or hormonal therapy, but that is not our recommendation.   Plan to proceed with TLH, BSO, SLN injection, mapping, and biopsy, possible LND with Dr. Fransisca Connors and either Dr. Enzo Bi or one of his colleagues on 02/01/2017. Labs including HbA1c, CXR, and EKG ordered.   Risks were discussed in detail. These include infection, anesthesia, bleeding, transfusion, wound separation, vaginal cuff dehiscence, medical issues (blood clots, stroke, heart attack, fluid in the lungs, pneumonia, abnormal heart rhythm, death), possible exploratory surgery with larger incision, lymphedema, lymphocyst, allergic reaction, injury to adjacent organs (bowel, bladder, blood vessels, nerves, ureters).   We recommended stopping ASA and omega 3 fatty acids as they may increase bleeding.  Urinary incontinence- we discussed options for management including Urology or Urogynecology referral prior to surgery vs completing her surgery for endometrial cancer. She would prefer to proceed with endometrial cancer surgery and then follow up with consultation later. She understands that she may require 2 surgical procedures rather than one with this approach, but that we can treat  the endometrial cancer sooner.   Suggested return to clinic in  4 weeks postoperatively to review pathology and perform postoperative exam. .    The patient's diagnosis, an outline of the further diagnostic and laboratory studies which  will be required, the recommendation, and alternatives were discussed.  All questions were answered to the patient's satisfaction.  A total of 60 minutes were spent with the patient/family today; 50% was spent in education, counseling and coordination of care for endometrial cancer and ovarian cyst.    Gillis Ends, MD    CC:  RMD: Brayton Mars, MD  PCP: Crecencio Mc, MD [address] [place], [place] [address] [phone]

## 2017-01-25 NOTE — Patient Instructions (Signed)
Laparoscopy Laparoscopy is a procedure to diagnose diseases in the abdomen. During the procedure, a thin, lighted, pencil-sized instrument called a laparoscope is inserted into the abdomen through an incision. The laparoscope allows your health care provider to look at the organs inside your body. LET YOUR HEALTH CARE PROVIDER KNOW ABOUT:  Any allergies you have.  All medicines you are taking, including vitamins, herbs, eye drops, creams, and over-the-counter medicines.  Previous problems you or members of your family have had with the use of anesthetics.  Any blood disorders you have.  Previous surgeries you have had.  Medical conditions you have. RISKS AND COMPLICATIONS  Generally, this is a safe procedure. However, problems can occur, which may include:  Infection.  Bleeding.  Damage to other organs.  Allergic reaction to the anesthetics used during the procedure. BEFORE THE PROCEDURE  Do not eat or drink anything after midnight on the night before the procedure or as directed by your health care provider.  Ask your health care provider about: ? Changing or stopping your regular medicines. ? Taking medicines such as aspirin and ibuprofen. These medicines can thin your blood. Do not take these medicines before your procedure if your health care provider instructs you not to.  Plan to have someone take you home after the procedure. PROCEDURE  You may be given a medicine to help you relax (sedative).  You will be given a medicine to make you sleep (general anesthetic).  Your abdomen will be inflated with a gas. This will make your organs easier to see.  Small incisions will be made in your abdomen.  A laparoscope and other small instruments will be inserted into the abdomen through the incisions.  A tissue sample may be removed from an organ in the abdomen for examination.  The instruments will be removed from the abdomen.  The gas will be released.  The incisions  will be closed with stitches (sutures). AFTER THE PROCEDURE  Your blood pressure, heart rate, breathing rate, and blood oxygen level will be monitored often until the medicines you were given have worn off.   This information is not intended to replace advice given to you by your health care provider. Make sure you discuss any questions you have with your health care provider.     Clear Liquid Diet for GYN Oncology Patients Day Before Surgery The day before your scheduled surgery DO NOT EAT any solid foods.  We do want you to drink enough liquids, but NO MILK products.  We do not want you to be dehydrated.  Clear liquids are defined as no milk products and no pieces of any solid food. The following are all approved for you to drink the day before you surgery.  Chicken, Beef or Vegetable Broth (bouillon or consomm) - NO BROTH AFTER MIDNIGHT  Plain Jello  (no fruit)  Water  Strained lemonade or fruit punch  Gatorade (any flavor)  CLEAR Ensure or Boost Breeze  Fruit juices without pulp, such as apple, grape, or cranberry juice  Clear sodas - NO SODA AFTER MIDNIGHT  Ice Pops without bits of fruit or fruit pulp  Honey  Tea or coffee without milk or cream Any foods not on the above list should be avoided.                                                                                 DIVISION OF GYNECOLOGIC ONCOLOGY BOWEL PREP   The following instructions are extremely important to prepare for your surgery. Please follow them carefully   Step 1: Liquid Diet Instructions   The day before surgery, drink ONLY CLEAR LIQUIDS for breakfast, lunch, dinner and throughout the day.  Drink at least 64 oz of fluid.             CLEAR LIQUID EXAMPLES:             Beef, chicken or vegetable broth, sodas, coffee, tea (sugar, lemon             artificial sweeteners, honey are acceptable), juices (apple, grape, cranberry, any    mixture of clear juices). Kool-Aid, Gatorade, Jell-o (without  fruit), popsicles                          NO MILK, MILK PRODUCTS, NON-DAIRY CREAMERS    Step 2: Laxatives           The evening before surgery:   Time: around 5pm   Follow these instructions carefully.   Administer 1 Dulcolax suppository according to manufacturer instructions on the box. You will need to purchase this laxative at a pharmacy or grocery store.     Individual responses to laxatives vary; this prep may cause multiple bowel movements. It often works in 30 minutes and may take as long as 3 hours. Stay near an available bathroom.    It is important to stay hydrated. Ensure you are still drinking clear liquids.      IMPORTANT: FOR YOUR SAFETY, WE WILL HAVE TO CANCEL YOUR SURGERY IF YOU DO NOT FOLLOW THESE INSTRUCTIONS.    Do not eat anything after midnight (including gum or candy) prior to your surgery.  Avoid drinking carbonated beverages after midnight.  You can have clear liquids up until one hour before you arrive at the hospital. "Nothing by mouth" means no liquids, gum, candy, etc for one hour before your arrival time.    Bowel Symptoms After Surgery After gynecologic surgery, women often have temporary changes in bowel function (constipation and gas pain).  Following are tips to help prevent and treat common bowel problems.  It also tells you when to call the doctor.  This is important because some symptoms might be a sign of a more serious bowel problem such as obstruction (bowel blockage).  These problems are rare but can happen after gynecologic surgery.   Besides surgery, what can temporarily affect bowel function? 1. Dietary changes   2. Decreased physical activity   3.Antibiotics   4. Pain medication   How can I prevent constipation (three days or more without a stool)? 1. Include fiber in your diet: whole grains, raw or dried fruits & vegetables, prunes, prune/pear juiceDrink at least 8 glasses of liquid (preferably water) every day 2. Avoid: ? Gas forming  foods such as broccoli, beans, peas, salads, cabbage, sweet potatoes ? Greasy, fatty, or fried foods 3. Activity helps bowel function return to normal, walk around the house at least 3-4 times each day for 15 minutes or longer, if tolerated.  Rocking in a rocking chair is preferable to sitting still. 4. Stool softeners: these are not laxatives, but serve to soften the stool to avoid straining.  Take 2-4 times a day until normal bowel function returns         Examples: Colace or generic equivalent (Docusafe) 5. Bulk laxatives: provide a concentrated source of fiber.    They do not stimulate the bowel.  Take 1-2 times each day until normal bowel function return.              Examples: Citrucel, Metamucil, Fiberal, Fibercon   What can I take for "Gas Pains"? 1. Simethicone (Mylicon, Gas-X, Maalox-Gas, Mylanta-Gas) take 3-4 times a day 2. Maalox Regular - take 3-4 times a day 3. Mylanta Regular - take 3-4 times a day   What can I take if I become constipated? 1. Start with stool softeners and add additional laxatives below as needed to have a bowel movement every 1-2 days  2. Stool softeners 1-2 tablets, 2 times a day 3. Senakot 1-2 tablets, 1-2 times a day 4. Glycerin suppository can soften hard stool take once a day 5. Bisacodyl suppository once a day  6. Milk of Magnesia 30 mL 1-2 times a day 7. Fleets or tap water enema    What can I do for nausea?  1. Limit most solid foods for 24-48 hours 2. Continue eating small frequent amounts of liquids and/or bland soft foods ? Toast, crackers, cooked cereal (grits, cream of wheat, rice) 3. Benadryl: a mild anti-nausea medicine can be obtained without a prescription. May cause drowsiness, especially if taken with narcotic pain medicines 4. Contact provider for prescription nausea medication     What can I do, or take for diarrhea (more than five loose stools per day)? 1. Drink plenty of clear fluids to prevent dehydration 2. May take Kaopectate,  Pepto-Bismol, Immodium, or probiotics for 1-2 days 3. Annusol or Preparation-H can be helpful for hemorrhoids and irritated tissue around anus   When should I call the doctor?             CONSTIPATION:   Not relieved after three days following the above program VOMITING:  That contains blood, "coffee ground" material  More the three times/hour and unable to keep down nausea medication for more than eight hours  With dry mouth, dark or strong urine, feeling light-headed, dizzy, or confused  With severe abdominal pain or bloating for more than 24 hours DIARRHEA:  That continues for more then 24-48 hours despite treatment  That contains blood or tarry material  With dry mouth, dark or strong urine, feeling light~headed, dizzy, or confused FEVER:  101 F or higher along with nausea, vomiting, gas pain, diarrhea UNABLE TO:  Pass gas from rectum for more than 24 hours  Tolerate liquids by mouth for more than 24 hours     Laparoscopy, Care After Refer to this sheet in the next few weeks. These instructions provide you with information about caring for yourself after your procedure. Your health care provider may also give you more specific instructions. Your treatment has been planned according to current medical practices, but problems sometimes occur. Call your health care provider if you have any problems or questions after your procedure. WHAT TO EXPECT AFTER THE PROCEDURE After your procedure, it is common to have mild discomfort in the throat and abdomen. HOME CARE INSTRUCTIONS  Take over-the-counter and prescription medicines only as told by your health care provider.  Do not drive for 24 hours if you received a sedative.  Return to your normal activities as told by your health care provider.  Do not take baths, swim, or use a hot tub until your health care provider approves. You may shower.  Follow instructions from your health care provider about how to take care of  your incision. Make sure you: ? Wash  your hands with soap and water before you change your bandage (dressing). If soap and water are not available, use hand sanitizer. ? Change your dressing as told by your health care provider. ? Leave stitches (sutures), skin glue, or adhesive strips in place. These skin closures may need to stay in place for 2 weeks or longer. If adhesive strip edges start to loosen and curl up, you may trim the loose edges. Do not remove adhesive strips completely unless your health care provider tells you to do that.  Check your incision area every day for signs of infection. Check for: ? More redness, swelling, or pain. ? More fluid or blood. ? Warmth. ? Pus or a bad smell.  It is your responsibility to get the results of your procedure. Ask your health care provider or the department performing the procedure when your results will be ready. SEEK MEDICAL CARE IF:  There is new pain in your shoulders.  You feel light-headed or faint.  You are unable to pass gas or unable to have a bowel movement.  You feel nauseous or you vomit.  You develop a rash.  You have more redness, swelling, or pain around your incision.  You have more fluid or blood coming from your incision.  Your incision feels warm to the touch.  You have pus or a bad smell coming from your incision.  You have a fever or chills. SEEK IMMEDIATE MEDICAL CARE IF:  Your pain is getting worse.  You have ongoing vomiting.  The edges of your incision open up.  You have trouble breathing.  You have chest pain.   This information is not intended to replace advice given to you by your health care provider. Make sure you discuss any questions you have with your health care provider.   Laparoscopic Hysterectomy, Care After Refer to this sheet in the next few weeks. These instructions provide you with information on caring for yourself after your procedure. Your health care provider may also give  you more specific instructions. Your treatment has been planned according to current medical practices, but problems sometimes occur. Call your health care provider if you have any problems or questions after your procedure. What can I expect after the procedure?  Pain and bruising at the incision sites. You will be given pain medicine to control it.  Menopausal symptoms such as hot flashes, night sweats, and insomnia if your ovaries were removed.  Sore throat from the breathing tube that was inserted during surgery. Follow these instructions at home:  Only take over-the-counter or prescription medicines for pain, discomfort, or fever as directed by your health care provider.  Do not take aspirin. It can cause bleeding.  Do not drive when taking pain medicine.  Follow your health care provider's advice regarding diet, exercise, lifting, driving, and general activities.  Resume your usual diet as directed and allowed.  Get plenty of rest and sleep.  Do not douche, use tampons, or have sexual intercourse for at least 6 weeks, or until your health care provider gives you permission.  Change your bandages (dressings) as directed by your health care provider.  Monitor your temperature and notify your health care provider of a fever.  Take showers instead of baths for 2-3 weeks.  Do not drink alcohol until your health care provider gives you permission.  If you develop constipation, you may take a mild laxative with your health care provider's permission. Bran foods may help with constipation problems. Drinking enough fluids   to keep your urine clear or pale yellow may help as well.  Try to have someone home with you for 1-2 weeks to help around the house.  Keep all of your follow-up appointments as directed by your health care provider. Contact a health care provider if:  You have swelling, redness, or increasing pain around your incision sites.  You have pus coming from your  incision.  You notice a bad smell coming from your incision.  Your incision breaks open.  You feel dizzy or lightheaded.  You have pain or bleeding when you urinate.  You have persistent diarrhea.  You have persistent nausea and vomiting.  You have abnormal vaginal discharge.  You have a rash.  You have any type of abnormal reaction or develop an allergy to your medicine.  You have poor pain control with your prescribed medicine. Get help right away if:  You have chest pain or shortness of breath.  You have severe abdominal pain that is not relieved with pain medicine.  You have pain or swelling in your legs. This information is not intended to replace advice given to you by your health care provider. Make sure you discuss any questions you have with your health care provider. Document Released: 07/03/2013 Document Revised: 02/18/2016 Document Reviewed: 04/02/2013 Elsevier Interactive Patient Education  2017 [employer].    Stop taking aspirin and omega 3 fatty acids today 01/25/17 Uterine Cancer Uterine cancer is an abnormal growth of cancer tissue (malignant tumor) in the uterus. Unlike noncancerous (benign) tumors, malignant tumors can spread to other parts of the body. Uterine cancer usually occurs after menopause. However, it may also occur around the time that menopause begins. The wall of the uterus has an inner layer of tissue (endometrium) and an outer layer of muscle tissue (myometrium). The most common type of uterine cancer begins in the endometrium (endometrial cancer). Cancer that begins in the myometrium (uterine sarcoma) is very rare. What are the causes? The exact cause of this condition is not known. What increases the risk? You are more likely to develop this condition if you:  Are older than 50.  Have an enlarged endometrium (endometrial hyperplasia).  Use hormone therapy.  Are severely overweight (obese).  Use the medicine tamoxifen.  You are  white (Caucasian).  Cannot bear children (are infertile).  Have never been pregnant.  Started menstruating at an age younger than 12 years.  Are older than 52 and are still having menstrual periods.  Have a history of cancer of the ovaries, intestines, or colon or rectum (colorectal cancer).  Have a history of enlarged ovaries with small cysts (polycystic ovarian syndrome).  Have a family history of:  Uterine cancer.  Hereditary nonpolyposis colon cancer (HNPCC).  Have diabetes, high blood pressure, thyroid disease, or gallbladder disease.  Use long-term, high-dose birth control pills.  Have been exposed to radiation.  Smoke. What are the signs or symptoms? Symptoms of this condition include:  Abnormal vaginal bleeding or discharge. Bleeding may start as a watery, blood-streaked flow that gradually contains more blood. This is the most common symptom. If you experience abnormal vaginal bleeding, do not assume that it is part of menopause.  Vaginal bleeding after menopause.  Unexplained weight loss.  Bleeding between periods.  Urination that is difficult, painful, or more frequent than usual.  A lump (mass) in the vagina.  Pain, bloating, or fullness in the abdomen.  Pain in the pelvic area.  Pain during sex. How is this diagnosed? This condition  may be diagnosed based on:  Your medical history and your symptoms.  A physical and pelvic exam. Your health care provider will feel your pelvis for any growths or enlarged lymph nodes.  Blood and urine tests.  Imaging tests, such as X-rays, CT scans, ultrasound, or MRIs.  A procedure in which a thin, flexible tube with a light and camera on the end is inserted through the vagina and used to look inside the uterus (hysteroscopy).  A Pap test to check for abnormal cells in the lower part of the uterus (cervix) and the upper vagina.  Removing a tissue sample (biopsy) from the uterine lining to check for cancer  cells.  Dilation and curettage (D&C). This is a procedure that involves stretching (dilation) the cervix and scraping (curettage) the inside lining of the uterus to get a biopsy and check for cancer cells. Your cancer will be staged to determine its severity and extent. Staging is an assessment of:  The size of the tumor.  Whether the cancer has spread.  Where the cancer has spread. The stages of uterine cancer are as follows:  Stage I. The cancer is only found in the uterus.  Stage II. The cancer has spread to the cervix.  Stage III. The cancer has spread outside the uterus, but not outside the pelvis. The cancer may have spread to the lymph nodes in the pelvis. Lymph nodes are part of your body's disease-fighting (immune) system. Lymph nodes are found in many locations in your body, including the neck, underarm, and groin.  Stage IV. The cancer has spread to other parts of the body, such as the bladder or rectum. How is this treated? This condition is often treated with surgery to remove:  The uterus, cervix, fallopian tubes, and ovaries (total hysterectomy).  The uterus and cervix (simple hysterectomy). The type of hysterectomy you will have depends on the extent of your cancer. Lymph nodes near the uterus may also be removed in some cases. Treatment may also include one or more of the following:  Chemotherapy. This uses medicines to kill the cancer cells and prevent their spread.  Radiation therapy. This uses high-energy rays to kill the cancer cells and prevent the spread of cancer.  Chemoradiation. This is a combination treatment that alternates chemotherapy with radiation treatments to enhance the way radiation works.  Brachytherapy. This involves placing radioactive materials inside the body where the cancer was removed.  Hormone therapy. This includes taking medicines that lower the levels of estrogen in the body. Follow these instructions at home: Activity   Return to  your normal activities as told by your health care provider. Ask your health care provider what activities are safe for you.  Exercise regularly as told by your health care provider.  Do not drive or use heavy machinery while taking prescription pain medicine. General instructions   Take over-the-counter and prescription medicines only as told by your health care provider.  Maintain a healthy diet.  Work with your health care provider to:  Manage any long-term (chronic) conditions you have, such as diabetes, high blood pressure, thyroid disease, or gallbladder disease.  Manage any side effects of your treatment.  Do not use any products that contain nicotine or tobacco, such as cigarettes and e-cigarettes. If you need help quitting, ask your health care provider.  Consider joining a support group to help you cope with stress. Your health care provider may be able to recommend a local or online support group.  Keep all  follow-up visits as told by your health care provider. This is important. Where to find more information:  American Cancer Society: https://www.cancer.[place] https://www.cancer.gov Contact a health care provider if:  You have pain in your pelvis or abdomen that gets worse.  You cannot urinate.  You have abnormal bleeding.  You have a fever. Get help right away if:  You develop sudden or new severe symptoms, such as:  Heavy bleeding.  Severe weakness.  Pain that is severe or does not get better with medicine. Summary  Uterine cancer is an abnormal growth of cancer tissue (malignant tumor) in the uterus. The most common type of uterine cancer begins in the endometrium (endometrial cancer).  This condition is often treated with surgery to remove the uterus, cervix, fallopian tubes, and ovaries (total hysterectomy) or the uterus and cervix (simple hysterectomy).  Work with your health care provider to manage any long-term  (chronic) conditions you have, such as diabetes, high blood pressure, thyroid disease, or gallbladder disease.  Consider joining a support group to help you cope with stress. Your health care provider may be able to recommend a local or online support group. This information is not intended to replace advice given to you by your health care provider. Make sure you discuss any questions you have with your health care provider. Document Released: 09/12/2005 Document Revised: 09/09/2016 Document Reviewed: 09/09/2016 Elsevier Interactive Patient Education  2017 [employer].

## 2017-01-26 LAB — HEMOGLOBIN A1C
Hgb A1c MFr Bld: 7.9 % — ABNORMAL HIGH (ref 4.8–5.6)
Mean Plasma Glucose: 180 mg/dL

## 2017-01-27 ENCOUNTER — Telehealth: Payer: Self-pay | Admitting: Internal Medicine

## 2017-01-27 ENCOUNTER — Telehealth: Payer: Self-pay

## 2017-01-27 MED ORDER — INSULIN DETEMIR 100 UNIT/ML ~~LOC~~ SOLN: 15.0000 [IU] | Freq: Every day | SUBCUTANEOUS | 11 refills | Status: DC

## 2017-01-27 NOTE — Telephone Encounter (Signed)
-----   Message from Adair Laundry, [place] sent at 01/27/2017  4:41 PM EDT ----- Spoke with pt and she stated that her sugars on average have been running right around 150 and she stated that her A1C was 6.9. ----- Message ----- From: Crecencio Mc, MD Sent: 01/27/2017   4:33 PM To: Adair Laundry, [place]  Patient's oncologist wants me to get her diabetes under better control before her surgery next Wednesday!!  Please call her asap and find out what they are running .   ----- Message ----- From: Gillis Ends, MD Sent: 01/26/2017   7:29 PM To: Crecencio Mc, MD, Mellody Drown, MD, #  Steffanie Dunn,  Can you please reach out to Ms. Zameer Borman regarding Ms. Square HbA1c for recommendations for management. Her levels are higher than we would like and she is having surgery this coming Wednesday.  Thank you all Sincerely [place]

## 2017-01-27 NOTE — Telephone Encounter (Signed)
  Oncology Nurse Navigator Documentation Notified Ms. Conlee of [facility] [id number] on 01/30/17 at 2:30 pm. Read back performed Navigator Location: [facility] (01/27/17 1400)   )Navigator Encounter Type: Telephone (01/27/17 1400) Telephone: Lahoma Crocker Call;Appt Confirmation/Clarification (01/27/17 1400)                                                  Time Spent with Patient: 15 (01/27/17 1400)

## 2017-01-27 NOTE — Progress Notes (Signed)
  Oncology Nurse Navigator Documentation  Navigator Location: [facility] (01/27/17 0800)   )Navigator Encounter Type: Diagnostic Results (01/27/17 0800)  Dr. Derrel Nip, please see message from Dr. Theora Gianotti below and advise:  Can you please reach out to Ms. Tullo regarding Ms. Glazier HbA1c for recommendations for management. Her levels are higher than we would like and she is having surgery this coming Wednesday.  Thank you all  Sincerely  [place]                                                   Time Spent with Patient: 15 (01/27/17 0800)

## 2017-01-27 NOTE — Telephone Encounter (Signed)
Spoke with pt and informed her of the medication that Dr. Derrel Nip would like for her to start. The pt stated that she does not need a RN visit to show her how to use the insulin pen because she has done it before. So the pt is going to start the medication as soon as she gets it picked up from the pharmacy. Told pt that if she does have any questions to give [place] a call back. Pt gave a verbal understanding.

## 2017-01-27 NOTE — Telephone Encounter (Signed)
Her a1c was 7.9   This week ,. She needs to start once daily  Basal insulin,  I will  send an rx  to pharmacy and she needs an RN  visit on Monday  To learn how to inject

## 2017-01-30 ENCOUNTER — Ambulatory Visit
Admission: RE | Admit: 2017-01-30 | Discharge: 2017-01-30 | Disposition: A | Discharge status: Home / Self Care | Payer: Medicare Other | Source: Ambulatory Visit | Attending: Obstetrics and Gynecology | Admitting: Obstetrics and Gynecology

## 2017-01-30 ENCOUNTER — Encounter
Admission: RE | Admit: 2017-01-30 | Discharge: 2017-01-30 | Disposition: A | Discharge status: Home / Self Care | Payer: Medicare Other | Source: Ambulatory Visit | Attending: Obstetrics and Gynecology | Admitting: Obstetrics and Gynecology

## 2017-01-30 ENCOUNTER — Telehealth: Payer: Self-pay | Admitting: Internal Medicine

## 2017-01-30 ENCOUNTER — Ambulatory Visit: Payer: Medicare Other

## 2017-01-30 DIAGNOSIS — C541 Malignant neoplasm of endometrium: Secondary | ICD-10-CM | POA: Insufficient documentation

## 2017-01-30 DIAGNOSIS — Z01818 Encounter for other preprocedural examination: Secondary | ICD-10-CM | POA: Insufficient documentation

## 2017-01-30 DIAGNOSIS — Z01812 Encounter for preprocedural laboratory examination: Secondary | ICD-10-CM | POA: Diagnosis not present

## 2017-01-30 DIAGNOSIS — Z136 Encounter for screening for cardiovascular disorders: Secondary | ICD-10-CM

## 2017-01-30 DIAGNOSIS — Z01810 Encounter for preprocedural cardiovascular examination: Secondary | ICD-10-CM | POA: Insufficient documentation

## 2017-01-30 HISTORY — DX: Failed or difficult intubation, initial encounter: T88.4XXA

## 2017-01-30 HISTORY — DX: Malignant (primary) neoplasm, unspecified: C80.1

## 2017-01-30 LAB — TYPE AND SCREEN
ABO/RH(D): O POS
Antibody Screen: NEGATIVE

## 2017-01-30 NOTE — Telephone Encounter (Signed)
Patient is to be on clear liquid diet so advised patient PCP requested to use G2.

## 2017-01-30 NOTE — Telephone Encounter (Signed)
Stay away from sugary drinks!  If they have to be clear liquids,  Use G2   If they don't have to be clear she can use Premier protein to det protein in her drinks

## 2017-01-30 NOTE — Telephone Encounter (Signed)
Pt called and wanted to ask a couple of questions in regards to her drinking. She is going a to for surgery for Uterine cancer on Wednesday and was asking what kind of drinks she can drink since she cannot eat. Pt wants to know if she should continue drinking sugar free drinks or can she have normal sugar drinks. Please advise, thank you!  Call pt @ [phone]

## 2017-01-30 NOTE — Patient Instructions (Signed)
Your procedure is scheduled on:Feb 01, 2017 (Wednesday) Report to Same Day Surgery 2nd floor medical mall [facility] Entrance-take elevator on left to 2nd floor.  Check in with surgery information desk.) To find out your arrival time please call [phone] between 1PM - 3PM on Jan 31, 2017 (Tuesday)   Remember: Instructions that are not followed completely may result in serious medical risk, up to and including death, or upon the discretion of your surgeon and anesthesiologist your surgery may need to be rescheduled.    _x___ 1. Do not eat food or drink liquids after midnight. No gum chewing or  hard candies                      __x__ 2. No Alcohol for 24 hours before or after surgery.   __x__3. No Smoking for 24 prior to surgery.   ____  4. Bring all medications with you on the day of surgery if instructed.    __x__ 5. Notify your doctor if there is any change in your medical condition     (cold, fever, infections).     Do not wear jewelry, make-up, hairpins, clips or nail polish.  Do not wear lotions, powders, or perfumes.   Do not shave 48 hours prior to surgery. Men may shave face and neck.  Do not bring valuables to the hospital.    [facility] is not responsible for any belongings or valuables.               Contacts, dentures or bridgework may not be worn into surgery.  Leave your suitcase in the car. After surgery it may be brought to your room.  For patients admitted to the hospital, discharge time is determined by your  treatment team                    Patients discharged the day of surgery will not be allowed to drive home.  You will need someone to drive you home and stay with you the night of your procedure.    Please read over the following fact sheets that you were given:   [facility] Preparing for Surgery and or MRSA Information   _x___ Take anti-hypertensive (unless it includes a diuretic), cardiac, seizure, asthma,     anti-reflux and psychiatric medicines.  These include:  1. Sertraline  2. Verapamil  3. Omeprazole (Omeprazole at bedtime tomorrow night (Tuesday night)  4.  5.  6.  ____Fleets enema or Magnesium Citrate as directed.   _x___ Use CHG Soap or sage wipes as directed on instruction sheet   _x__ Use inhalers on the day of surgery and bring to hospital day of surgery (USE ALBUTEROL [place])  __X__ Stop Metformin and Janumet 2 days prior to surgery. (STOP METFORMIN TODAY)    __X__ Take 1/2 of usual insulin dose the night before surgery and none on the morning surgery (TAKE ONE-HALF OF LEVEMIR INSULIN THE NIGHT BEFORE SURGERY)  .   _x___ Follow recommendations from Cardiologist, Pulmonologist or PCP regarding          stopping Aspirin, Coumadin, Pllavix ,Eliquis, Effient, or Pradaxa, and Pletal.( PATIENT STOPPED ASPIRIN ON MAY 4 )  X____Stop Anti-inflammatories such as Advil, Aleve, Ibuprofen, Motrin, Naproxen, Naprosyn, Goodies powders or aspirin products. OK to take Tylenol    _x___ Stop supplements until after surgery.  But may continue Vitamin D, Vitamin B,  and multivitamin. (STOP VITAMIN C, FISH OIL, CINNAMON ,BIOTIN  NOW)   _x___ Bring C-Pap to the hospital.

## 2017-01-30 NOTE — Telephone Encounter (Signed)
Pt is having surgery on Wednesday and was told that she would need to be on a liquid only diet all day tomorrow. The pt is wanting to know that since she is diabetic and not allowed to eat does she need to drink drinks with sugar or still stay away from the sugar. Please advise.

## 2017-02-01 ENCOUNTER — Ambulatory Visit
Admission: RE | Admit: 2017-02-01 | Payer: Medicare Other | Source: Ambulatory Visit | Admitting: Obstetrics and Gynecology

## 2017-02-01 ENCOUNTER — Encounter: Admission: RE | Payer: Self-pay | Source: Ambulatory Visit

## 2017-02-01 SURGERY — HYSTERECTOMY, TOTAL, LAPAROSCOPIC
Anesthesia: Choice

## 2017-02-01 MED ORDER — CEFAZOLIN SODIUM-DEXTROSE 2-4 GM/100ML-% IV SOLN
INTRAVENOUS | Status: DC
Start: 2017-02-01 — End: 2017-02-01
  Filled 2017-02-01: qty 100

## 2017-02-01 NOTE — Telephone Encounter (Signed)
Patient Is leaving to go out of town she had a death in her family. She has postponed her surgery . Her question is should she continue to take insuline .

## 2017-02-02 NOTE — Telephone Encounter (Signed)
Spoke with the patient advised to continue her insulin at this time. She verbalized understanding, thanks

## 2017-02-02 NOTE — Telephone Encounter (Signed)
Yes

## 2017-02-02 NOTE — Telephone Encounter (Signed)
Please advise, thanks.

## 2017-02-03 ENCOUNTER — Telehealth: Payer: Self-pay

## 2017-02-03 NOTE — Telephone Encounter (Signed)
  Oncology Nurse Navigator Documentation Called and spoke with Sandra Hendrix regarding her upcoming surgery. Her surgery for 02-14-2023 was cancelled due to a death in her family. She will be out of town for the rest of the month. She confirmed that she will be back able to have surgery 03/01/17. Notified Sandra Hendrix and Sandra Hendrix. They would like for her to be prescribed Provera 10 mg daily. until her surgery. Sandra Hendrix is going to get the name of a pharmacy in [place] for me and I will send the script for her. OR notified of surgery date change. Navigator Location: [facility] (02/03/17 1100)   )Navigator Encounter Type: Telephone (02/03/17 1100) Telephone: Sandra Hendrix Call;Appt Confirmation/Clarification (02/03/17 1100)                                                  Time Spent with Patient: 30 (02/03/17 1100)

## 2017-02-06 ENCOUNTER — Telehealth: Payer: Self-pay

## 2017-02-06 MED ORDER — MEDROXYPROGESTERONE ACETATE 10 MG PO TABS: 10.0000 mg | ORAL_TABLET | Freq: Every day | ORAL | 0 refills | Status: DC

## 2017-02-06 NOTE — Telephone Encounter (Signed)
  Oncology Nurse Navigator Documentation Received call from Sandra Hendrix with [employer] in [place]. Provera 10mg  daily script sent to pharmacy. Navigator Location: [facility] (02/06/17 1400)   )Navigator Encounter Type: Telephone (02/06/17 1400) Telephone: Incoming Call (02/06/17 1400)                                                  Time Spent with Patient: 15 (02/06/17 1400)

## 2017-02-13 ENCOUNTER — Encounter: Payer: Self-pay | Admitting: Obstetrics and Gynecology

## 2017-02-13 ENCOUNTER — Encounter: Payer: Self-pay | Admitting: Internal Medicine

## 2017-02-14 ENCOUNTER — Telehealth: Payer: Self-pay | Admitting: Obstetrics and Gynecology

## 2017-02-15 ENCOUNTER — Other Ambulatory Visit: Payer: Self-pay

## 2017-02-15 DIAGNOSIS — C55 Malignant neoplasm of uterus, part unspecified: Secondary | ICD-10-CM

## 2017-02-15 NOTE — Telephone Encounter (Signed)
ERROR

## 2017-02-16 ENCOUNTER — Telehealth: Payer: Self-pay | Admitting: *Deleted

## 2017-02-16 NOTE — Telephone Encounter (Signed)
Patient will have surgery on 03/01/17. She question if she should see Dr.Tullo prior to surgery . Pt contact [phone]

## 2017-02-17 NOTE — Telephone Encounter (Signed)
If her surgeon is requiring a preoperative clearance, then yes,  Otherwise the biggest concern is whether her BS are under control and if she would  like to see me for that we will fit her in

## 2017-02-17 NOTE — Telephone Encounter (Signed)
Pt already has an appt scheduled for 02/24/2017

## 2017-02-17 NOTE — Telephone Encounter (Signed)
Spoke with pt and she stated that is having a hysterectomy because she has uterine cancer. Would you want to see the pt before the surgery? Please advise.

## 2017-02-22 ENCOUNTER — Other Ambulatory Visit: Payer: Medicare Other

## 2017-02-22 ENCOUNTER — Other Ambulatory Visit (INDEPENDENT_AMBULATORY_CARE_PROVIDER_SITE_OTHER): Payer: Medicare Other

## 2017-02-22 DIAGNOSIS — I10 Essential (primary) hypertension: Secondary | ICD-10-CM | POA: Diagnosis not present

## 2017-02-22 DIAGNOSIS — E1121 Type 2 diabetes mellitus with diabetic nephropathy: Secondary | ICD-10-CM | POA: Diagnosis not present

## 2017-02-22 DIAGNOSIS — E785 Hyperlipidemia, unspecified: Secondary | ICD-10-CM | POA: Diagnosis not present

## 2017-02-22 LAB — COMPREHENSIVE METABOLIC PANEL
ALT: 16 U/L (ref 0–35)
AST: 19 U/L (ref 0–37)
Albumin: 4.4 g/dL (ref 3.5–5.2)
Alkaline Phosphatase: 107 U/L (ref 39–117)
BUN: 22 mg/dL (ref 6–23)
CO2: 25 mEq/L (ref 19–32)
Calcium: 9.7 mg/dL (ref 8.4–10.5)
Chloride: 106 mEq/L (ref 96–112)
Creatinine, Ser: 1.04 mg/dL (ref 0.40–1.20)
GFR: 55.23 mL/min — ABNORMAL LOW (ref >60.00)
Glucose, Bld: 111 mg/dL — ABNORMAL HIGH (ref 70–99)
Potassium: 4 mEq/L (ref 3.5–5.1)
Sodium: 139 mEq/L (ref 135–145)
Total Bilirubin: 0.4 mg/dL (ref 0.2–1.2)
Total Protein: 7.4 g/dL (ref 6.0–8.3)

## 2017-02-22 LAB — MICROALBUMIN / CREATININE URINE RATIO
Creatinine,U: 106.3 mg/dL
Microalb Creat Ratio: 2.1 mg/g (ref 0.0–30.0)
Microalb, Ur: 2.2 mg/dL — ABNORMAL HIGH (ref 0.0–1.9)

## 2017-02-22 LAB — LIPID PANEL
Cholesterol: 165 mg/dL (ref 0–200)
HDL: 67.5 mg/dL (ref >39.00)
LDL Cholesterol: 84 mg/dL (ref 0–99)
NonHDL: 97.08
Total CHOL/HDL Ratio: 2
Triglycerides: 67 mg/dL (ref 0.0–149.0)
VLDL: 13.4 mg/dL (ref 0.0–40.0)

## 2017-02-22 LAB — LDL CHOLESTEROL, DIRECT: Direct LDL: 80 mg/dL

## 2017-02-22 LAB — HEMOGLOBIN A1C: Hgb A1c MFr Bld: 7.8 % — ABNORMAL HIGH (ref 4.6–6.5)

## 2017-02-24 ENCOUNTER — Encounter: Payer: Self-pay | Admitting: Internal Medicine

## 2017-02-24 ENCOUNTER — Ambulatory Visit (INDEPENDENT_AMBULATORY_CARE_PROVIDER_SITE_OTHER): Payer: Medicare Other | Admitting: Internal Medicine

## 2017-02-24 DIAGNOSIS — E785 Hyperlipidemia, unspecified: Secondary | ICD-10-CM

## 2017-02-24 DIAGNOSIS — C55 Malignant neoplasm of uterus, part unspecified: Secondary | ICD-10-CM

## 2017-02-24 DIAGNOSIS — N183 Chronic kidney disease, stage 3 unspecified: Secondary | ICD-10-CM

## 2017-02-24 DIAGNOSIS — Z794 Long term (current) use of insulin: Secondary | ICD-10-CM | POA: Diagnosis not present

## 2017-02-24 DIAGNOSIS — E1121 Type 2 diabetes mellitus with diabetic nephropathy: Secondary | ICD-10-CM

## 2017-02-24 MED ORDER — GLIPIZIDE ER 5 MG PO TB24: 5.0000 mg | ORAL_TABLET | Freq: Every day | ORAL | 3 refills | Status: DC

## 2017-02-24 MED ORDER — SITAGLIPTIN PHOSPHATE 100 MG PO TABS: 100.0000 mg | ORAL_TABLET | Freq: Every day | ORAL | 3 refills | Status: DC

## 2017-02-24 NOTE — Progress Notes (Signed)
Subjective:  Patient ID: Sandra Hendrix, female    DOB: [birth year]  Age: 71 y.o. MRN: [id number]  CC: Diagnoses of Endometrioid adenocarcinoma of uterus ([place]), Type 2 diabetes mellitus with diabetic nephropathy, with long-term current use of insulin ([place]), CKD (chronic kidney disease) stage 3, GFR 30-59 ml/min, and Hyperlipidemia LDL goal <100 were pertinent to this visit.  [name] presents for follow up on type 2 DM and hyperlipidemia.  She has had her hysterectomy postponed due to elevated blood sugars and does not appear to have made much progress over the past 3 months .  She has forgotten to bring her blood sugar log with her today for review.   Lab Results  Component Value Date   HGBA1C 7.8 (H) 02/22/2017   CBG was  146 (random) yesterday.  Had a low blood sugar yesterday right after dinner. Of 60   Taking her glipizide , taking the Levemir at bedtime , and taking Januvia later in the morning .    First meal of the day is at noon.  Dinner at 6:00     Outpatient Medications Prior to Visit  Medication Sig Dispense Refill  . ACCU-CHEK AVIVA PLUS test strip Test once daily 100 each 2  . albuterol (PROVENTIL HFA;VENTOLIN HFA) 108 (90 BASE) MCG/ACT inhaler Inhale 2 puffs into the lungs every 6 (six) hours as needed for wheezing or shortness of breath.     . Ascorbic Acid (VITAMIN C) 1000 MG tablet Take 1,000 mg by mouth every evening.     Marland Kitchen aspirin EC 81 MG tablet Take 81 mg by mouth every evening.     Marland Kitchen atorvastatin (LIPITOR) 20 MG tablet Take 20 mg by mouth at bedtime.     Marland Kitchen azelastine (ASTELIN) 0.1 % nasal spray Place 1 spray into both nostrils daily.    . Biotin 1000 MCG tablet Take 1,000 mcg by mouth 2 (two) times daily.    . Cholecalciferol (D3-1000 PO) Take 1 tablet by mouth daily.     Marland Kitchen CINNAMON PO Take 1,000 mg by mouth 2 (two) times daily.    . fish oil-omega-3 fatty acids 1000 MG capsule Take 1 g by mouth every evening.     . fluticasone (FLONASE) 50  MCG/ACT nasal spray Place 1 spray into both nostrils daily. 48 g 1  . furosemide (LASIX) 20 MG tablet Take 1 tablet by mouth  every other day (Patient taking differently: Take 1 tablet by mouth every other day) 45 tablet 2  . insulin detemir (LEVEMIR) 100 UNIT/ML injection Inject 0.15 mLs (15 Units total) into the skin at bedtime. (Patient taking differently: Inject 20 Units into the skin at bedtime. ) 10 mL 11  . Lancet Devices (ACCU-CHEK SOFTCLIX) lancets Use to test blood sugars 1 -2 times daily 1 each 3  . loratadine (CLARITIN) 10 MG tablet Take 10 mg by mouth daily.    Marland Kitchen losartan-hydrochlorothiazide (HYZAAR) 50-12.5 MG tablet Take 1 tablet by mouth daily. 90 tablet 0  . Meclizine HCl 25 MG CHEW Chew 25 mg by mouth daily as needed (for vertigo).     . medroxyPROGESTERone (PROVERA) 10 MG tablet Take 1 tablet (10 mg total) by mouth daily. (Patient taking differently: Take 10 mg by mouth every evening. ) 30 tablet 0  . metFORMIN (GLUCOPHAGE-XR) 750 MG 24 hr tablet Take 2 tablets by mouth  daily with breakfast 180 tablet 2  . Multiple Vitamin (MULTIVITAMIN) tablet Take 1 tablet by mouth daily.    Marland Kitchen  omeprazole (PRILOSEC) 20 MG capsule Take 1 capsule by mouth  daily 90 capsule 2  . Potassium 99 MG TABS Take 99 mg by mouth daily.     . Probiotic Product (PROBIOTIC-10) CAPS Take 1 capsule by mouth daily.     Marland Kitchen senna-docusate (SENOKOT-S) 8.6-50 MG per tablet Take 1 tablet by mouth daily. 30 tablet 0  . sertraline (ZOLOFT) 50 MG tablet Take 1 tablet by mouth  daily 90 tablet 2  . verapamil (VERELAN PM) 180 MG 24 hr capsule Take 1 capsule by mouth  daily 90 capsule 2  . acetaminophen (TYLENOL) 325 MG tablet Take 325 mg by mouth daily as needed for moderate pain.    Marland Kitchen glipiZIDE (GLUCOTROL XL) 10 MG 24 hr tablet Take 1 tablet by mouth  daily (Patient taking differently: take 10mg s by mouth once daily in the evening) 90 tablet 2  . sitaGLIPtin (JANUVIA) 50 MG tablet Take 50mg s by mouth daily     No  facility-administered medications prior to visit.     Review of Systems;  Patient denies headache, fevers, malaise, unintentional weight loss, skin rash, eye pain, sinus congestion and sinus pain, sore throat, dysphagia,  hemoptysis , cough, dyspnea, wheezing, chest pain, palpitations, orthopnea, edema, abdominal pain, nausea, melena, diarrhea, constipation, flank pain, dysuria, hematuria, urinary  Frequency, nocturia, numbness, tingling, seizures,  Focal weakness, Loss of consciousness,  Tremor, insomnia, depression, anxiety, and suicidal ideation.      Objective:  BP 128/68 (BP Location: Left Arm, Patient Position: Sitting, Cuff Size: Normal)   Pulse 73   Temp 98.2 F (36.8 C) (Oral)   Resp 16   Ht 5\' 1"  (1.549 m)   Wt 181 lb 3.2 oz (82.2 kg)   SpO2 98%   BMI 34.24 kg/m   BP Readings from Last 3 Encounters:  02/24/17 128/68  01/30/17 123/62  01/25/17 116/68    Wt Readings from Last 3 Encounters:  02/24/17 181 lb 3.2 oz (82.2 kg)  01/30/17 180 lb (81.6 kg)  01/25/17 180 lb 8 oz (81.9 kg)    General appearance: alert, cooperative and appears stated age Ears: normal TM's and external ear canals both ears Throat: lips, mucosa, and tongue normal; teeth and gums normal Neck: no adenopathy, no carotid bruit, supple, symmetrical, trachea midline and thyroid not enlarged, symmetric, no tenderness/mass/nodules Back: symmetric, no curvature. ROM normal. No CVA tenderness. Lungs: clear to auscultation bilaterally Heart: regular rate and rhythm, S1, S2 normal, no murmur, click, rub or gallop Abdomen: soft, non-tender; bowel sounds normal; no masses,  no organomegaly Pulses: 2+ and symmetric Skin: Skin color, texture, turgor normal. No rashes or lesions Lymph nodes: Cervical, supraclavicular, and axillary nodes normal.  Lab Results  Component Value Date   HGBA1C 7.8 (H) 02/22/2017   HGBA1C 7.9 (H) 01/25/2017   HGBA1C 6.9 11/23/2016    Lab Results  Component Value Date    CREATININE 1.04 02/22/2017   CREATININE 1.03 (H) 01/25/2017   CREATININE 0.99 12/29/2016    Lab Results  Component Value Date   WBC 5.4 01/25/2017   HGB 13.1 01/25/2017   HCT 38.4 01/25/2017   PLT 274 01/25/2017   GLUCOSE 111 (H) 02/22/2017   CHOL 165 02/22/2017   TRIG 67.0 02/22/2017   HDL 67.50 02/22/2017   LDLDIRECT 80.0 02/22/2017   LDLCALC 84 02/22/2017   ALT 16 02/22/2017   AST 19 02/22/2017   NA 139 02/22/2017   K 4.0 02/22/2017   CL 106 02/22/2017   CREATININE 1.04 02/22/2017  BUN 22 02/22/2017   CO2 25 02/22/2017   TSH 2.69 11/07/2014   INR 1.1 01/23/2009   HGBA1C 7.8 (H) 02/22/2017   MICROALBUR 2.2 (H) 02/22/2017    Dg Chest 2 View  Result Date: 01/30/2017 CLINICAL DATA:  Endometrial cancer schedule for surgery Feb 01, 2017. EXAM: CHEST  2 VIEW COMPARISON:  July 12, 2010 FINDINGS: The heart size and mediastinal contours are within normal limits. Both lungs are clear. Degenerative joint changes of the spine are noted. Prior cholecystectomy clips are noted. IMPRESSION: No active cardiopulmonary disease. Electronically Signed   By: Abelardo Diesel M.D.   On: 01/30/2017 19:02    Assessment & Plan:   Problem List Items Addressed This Visit    Hyperlipidemia LDL goal <100    LDL and triglycerides are at goal on atorvastatin. SHe has no side effects and liver enzymes are normal. No changes today    Lab Results  Component Value Date   CHOL 165 02/22/2017   HDL 67.50 02/22/2017   LDLCALC 84 02/22/2017   LDLDIRECT 80.0 02/22/2017   TRIG 67.0 02/22/2017   CHOLHDL 2 02/22/2017   Lab Results  Component Value Date   ALT 16 02/22/2017   AST 19 02/22/2017   ALKPHOS 107 02/22/2017   BILITOT 0.4 02/22/2017           Endometrioid adenocarcinoma of uterus [facility])    Surgery was postponed from May 9 due to elevated blood sugars and has been rescheduled for next week .Marland Kitchen       DM (diabetes mellitus), type 2 with renal complications ([facility]) (Chronic)    Her  diabetes remains less than optimally controlled and she has not been aggressively managing her diabetes due to the death of her brother.  Increase Levemir to 20 units daily and reduce glipizide to 5 mg to avoid recurrent hypoglycemia. Increase Januvia to 100 mg daily   Lab Results  Component Value Date   HGBA1C 7.8 (H) 02/22/2017   Lab Results  Component Value Date   MICROALBUR 2.2 (H) 02/22/2017         Relevant Medications   glipiZIDE (GLUCOTROL XL) 5 MG 24 hr tablet   sitaGLIPtin (JANUVIA) 100 MG tablet   CKD (chronic kidney disease) stage 3, GFR 30-59 ml/min    Monitored semi annually by [facility].  GFR has been stable at 60 ml/min  Workup was notable only for a congenitally small right kidney   By U/S.  Lab Results  Component Value Date   MICROALBUR 2.2 (H) 02/22/2017   Lab Results  Component Value Date   CREATININE 1.04 02/22/2017           A total of 25 minutes of face to face time was spent with patient more than half of which was spent in counselling about the above mentioned conditions  and coordination of care  I have changed Ms. Stoy's glipiZIDE and sitaGLIPtin. I am also having her maintain her Cholecalciferol (D3-1000 PO), multivitamin, Potassium, fish oil-omega-3 fatty acids, Meclizine HCl, albuterol, accu-chek softclix, aspirin EC, senna-docusate, verapamil, omeprazole, sertraline, metFORMIN, fluticasone, ACCU-CHEK AVIVA PLUS, furosemide, PROBIOTIC-10, vitamin C, CINNAMON PO, loratadine, atorvastatin, losartan-hydrochlorothiazide, azelastine, Biotin, insulin detemir, and medroxyPROGESTERone.  Meds ordered this encounter  Medications  . glipiZIDE (GLUCOTROL XL) 5 MG 24 hr tablet    Sig: Take 1 tablet (5 mg total) by mouth daily.    Dispense:  30 tablet    Refill:  3  . sitaGLIPtin (JANUVIA) 100 MG tablet    Sig:  Take 1 tablet (100 mg total) by mouth daily.    Dispense:  90 tablet    Refill:  3    Medications Discontinued During This Encounter  Medication  Reason  . glipiZIDE (GLUCOTROL XL) 10 MG 24 hr tablet Reorder  . sitaGLIPtin (JANUVIA) 50 MG tablet Reorder    Follow-up: No Follow-up on file.   Crecencio Mc, MD

## 2017-02-24 NOTE — Patient Instructions (Addendum)
Your A1c is slightly better at 7.8, but still not at goal   Increase your dose of Levemir to 20 units   Reduce the glipizide to 1/2 tablet.  (5 mg )  To avoid repeat low blood sugars   Increase the Januvia to 100 mg daily

## 2017-02-26 NOTE — Assessment & Plan Note (Signed)
LDL and triglycerides are at goal on atorvastatin. SHe has no side effects and liver enzymes are normal. No changes today    Lab Results  Component Value Date   CHOL 165 02/22/2017   HDL 67.50 02/22/2017   LDLCALC 84 02/22/2017   LDLDIRECT 80.0 02/22/2017   TRIG 67.0 02/22/2017   CHOLHDL 2 02/22/2017   Lab Results  Component Value Date   ALT 16 02/22/2017   AST 19 02/22/2017   ALKPHOS 107 02/22/2017   BILITOT 0.4 02/22/2017

## 2017-02-26 NOTE — Assessment & Plan Note (Addendum)
Her diabetes remains less than optimally controlled and she has not been aggressively managing her diabetes due to the death of her brother.  Increase Levemir to 20 units daily and reduce glipizide to 5 mg to avoid recurrent hypoglycemia. Increase Januvia to 100 mg daily   Lab Results  Component Value Date   HGBA1C 7.8 (H) 02/22/2017   Lab Results  Component Value Date   MICROALBUR 2.2 (H) 02/22/2017

## 2017-02-26 NOTE — Assessment & Plan Note (Signed)
Surgery was postponed from May 9 due to elevated blood sugars and has been rescheduled for next week .Marland Kitchen

## 2017-02-26 NOTE — Assessment & Plan Note (Signed)
Monitored semi annually by [facility].  GFR has been stable at 60 ml/min  Workup was notable only for a congenitally small right kidney   By U/S.  Lab Results  Component Value Date   MICROALBUR 2.2 (H) 02/22/2017   Lab Results  Component Value Date   CREATININE 1.04 02/22/2017

## 2017-02-27 ENCOUNTER — Inpatient Hospital Stay: Payer: Medicare Other | Attending: Obstetrics and Gynecology

## 2017-02-27 DIAGNOSIS — C55 Malignant neoplasm of uterus, part unspecified: Secondary | ICD-10-CM

## 2017-02-27 DIAGNOSIS — C541 Malignant neoplasm of endometrium: Secondary | ICD-10-CM | POA: Insufficient documentation

## 2017-02-27 LAB — TYPE AND SCREEN
ABO/RH(D): O POS
Antibody Screen: NEGATIVE

## 2017-02-27 LAB — CBC WITH DIFFERENTIAL/PLATELET
Basophils Absolute: 0 10*3/uL (ref 0–0.1)
Basophils Relative: 1 %
Eosinophils Absolute: 0.1 10*3/uL (ref 0–0.7)
Eosinophils Relative: 3 %
HCT: 37.4 % (ref 35.0–47.0)
Hemoglobin: 12.9 g/dL (ref 12.0–16.0)
Lymphocytes Relative: 21 %
Lymphs Abs: 1 10*3/uL (ref 1.0–3.6)
MCH: 30.5 pg (ref 26.0–34.0)
MCHC: 34.5 g/dL (ref 32.0–36.0)
MCV: 88.5 fL (ref 80.0–100.0)
Monocytes Absolute: 0.3 10*3/uL (ref 0.2–0.9)
Monocytes Relative: 6 %
Neutro Abs: 3.3 10*3/uL (ref 1.4–6.5)
Neutrophils Relative %: 69 %
Platelets: 276 10*3/uL (ref 150–440)
RBC: 4.22 MIL/uL (ref 3.80–5.20)
RDW: 14.6 % — ABNORMAL HIGH (ref 11.5–14.5)
WBC: 4.7 10*3/uL (ref 3.6–11.0)

## 2017-02-27 LAB — COMPREHENSIVE METABOLIC PANEL
ALT: 19 U/L (ref 14–54)
AST: 26 U/L (ref 15–41)
Albumin: 4 g/dL (ref 3.5–5.0)
Alkaline Phosphatase: 88 U/L (ref 38–126)
Anion gap: 8 (ref 5–15)
BUN: 28 mg/dL — ABNORMAL HIGH (ref 6–20)
CO2: 23 mmol/L (ref 22–32)
Calcium: 9.2 mg/dL (ref 8.9–10.3)
Chloride: 107 mmol/L (ref 101–111)
Creatinine, Ser: 1.05 mg/dL — ABNORMAL HIGH (ref 0.44–1.00)
GFR calc Af Amer: 60 mL/min — ABNORMAL LOW (ref >60)
GFR calc non Af Amer: 52 mL/min — ABNORMAL LOW (ref >60)
Glucose, Bld: 109 mg/dL — ABNORMAL HIGH (ref 65–99)
Potassium: 3.8 mmol/L (ref 3.5–5.1)
Sodium: 138 mmol/L (ref 135–145)
Total Bilirubin: 0.6 mg/dL (ref 0.3–1.2)
Total Protein: 7.4 g/dL (ref 6.5–8.1)

## 2017-03-01 ENCOUNTER — Ambulatory Visit: Payer: Medicare Other | Admitting: Certified Registered"

## 2017-03-01 ENCOUNTER — Ambulatory Visit
Admission: RE | Admit: 2017-03-01 | Discharge: 2017-03-01 | Disposition: A | Discharge status: Home / Self Care | Payer: Medicare Other | Source: Ambulatory Visit | Attending: Obstetrics and Gynecology | Admitting: Obstetrics and Gynecology

## 2017-03-01 ENCOUNTER — Encounter: Payer: Self-pay | Admitting: *Deleted

## 2017-03-01 ENCOUNTER — Ambulatory Visit: Payer: Medicare Other

## 2017-03-01 ENCOUNTER — Encounter
Admission: RE | Disposition: A | Discharge status: Home / Self Care | Payer: Self-pay | Source: Ambulatory Visit | Attending: Obstetrics and Gynecology

## 2017-03-01 DIAGNOSIS — C542 Malignant neoplasm of myometrium: Secondary | ICD-10-CM | POA: Diagnosis not present

## 2017-03-01 DIAGNOSIS — F419 Anxiety disorder, unspecified: Secondary | ICD-10-CM | POA: Diagnosis not present

## 2017-03-01 DIAGNOSIS — Z8269 Family history of other diseases of the musculoskeletal system and connective tissue: Secondary | ICD-10-CM | POA: Insufficient documentation

## 2017-03-01 DIAGNOSIS — K581 Irritable bowel syndrome with constipation: Secondary | ICD-10-CM | POA: Insufficient documentation

## 2017-03-01 DIAGNOSIS — Z794 Long term (current) use of insulin: Secondary | ICD-10-CM | POA: Insufficient documentation

## 2017-03-01 DIAGNOSIS — Z9049 Acquired absence of other specified parts of digestive tract: Secondary | ICD-10-CM | POA: Insufficient documentation

## 2017-03-01 DIAGNOSIS — K449 Diaphragmatic hernia without obstruction or gangrene: Secondary | ICD-10-CM | POA: Insufficient documentation

## 2017-03-01 DIAGNOSIS — Z813 Family history of other psychoactive substance abuse and dependence: Secondary | ICD-10-CM | POA: Insufficient documentation

## 2017-03-01 DIAGNOSIS — Z8349 Family history of other endocrine, nutritional and metabolic diseases: Secondary | ICD-10-CM | POA: Insufficient documentation

## 2017-03-01 DIAGNOSIS — K2270 Barrett's esophagus without dysplasia: Secondary | ICD-10-CM | POA: Insufficient documentation

## 2017-03-01 DIAGNOSIS — I872 Venous insufficiency (chronic) (peripheral): Secondary | ICD-10-CM | POA: Insufficient documentation

## 2017-03-01 DIAGNOSIS — Z7982 Long term (current) use of aspirin: Secondary | ICD-10-CM | POA: Diagnosis not present

## 2017-03-01 DIAGNOSIS — N183 Chronic kidney disease, stage 3 (moderate): Secondary | ICD-10-CM | POA: Insufficient documentation

## 2017-03-01 DIAGNOSIS — F329 Major depressive disorder, single episode, unspecified: Secondary | ICD-10-CM | POA: Diagnosis not present

## 2017-03-01 DIAGNOSIS — E669 Obesity, unspecified: Secondary | ICD-10-CM | POA: Diagnosis not present

## 2017-03-01 DIAGNOSIS — I129 Hypertensive chronic kidney disease with stage 1 through stage 4 chronic kidney disease, or unspecified chronic kidney disease: Secondary | ICD-10-CM | POA: Insufficient documentation

## 2017-03-01 DIAGNOSIS — G4733 Obstructive sleep apnea (adult) (pediatric): Secondary | ICD-10-CM | POA: Diagnosis not present

## 2017-03-01 DIAGNOSIS — E1122 Type 2 diabetes mellitus with diabetic chronic kidney disease: Secondary | ICD-10-CM | POA: Insufficient documentation

## 2017-03-01 DIAGNOSIS — N83292 Other ovarian cyst, left side: Secondary | ICD-10-CM | POA: Diagnosis not present

## 2017-03-01 DIAGNOSIS — R32 Unspecified urinary incontinence: Secondary | ICD-10-CM | POA: Diagnosis not present

## 2017-03-01 DIAGNOSIS — E1151 Type 2 diabetes mellitus with diabetic peripheral angiopathy without gangrene: Secondary | ICD-10-CM | POA: Diagnosis not present

## 2017-03-01 DIAGNOSIS — C541 Malignant neoplasm of endometrium: Secondary | ICD-10-CM

## 2017-03-01 DIAGNOSIS — Z96653 Presence of artificial knee joint, bilateral: Secondary | ICD-10-CM | POA: Insufficient documentation

## 2017-03-01 DIAGNOSIS — E785 Hyperlipidemia, unspecified: Secondary | ICD-10-CM | POA: Diagnosis not present

## 2017-03-01 DIAGNOSIS — Z8249 Family history of ischemic heart disease and other diseases of the circulatory system: Secondary | ICD-10-CM | POA: Insufficient documentation

## 2017-03-01 DIAGNOSIS — Z811 Family history of alcohol abuse and dependence: Secondary | ICD-10-CM | POA: Insufficient documentation

## 2017-03-01 DIAGNOSIS — Z7951 Long term (current) use of inhaled steroids: Secondary | ICD-10-CM | POA: Insufficient documentation

## 2017-03-01 DIAGNOSIS — D259 Leiomyoma of uterus, unspecified: Secondary | ICD-10-CM | POA: Diagnosis not present

## 2017-03-01 DIAGNOSIS — N882 Stricture and stenosis of cervix uteri: Secondary | ICD-10-CM | POA: Diagnosis not present

## 2017-03-01 DIAGNOSIS — Z8261 Family history of arthritis: Secondary | ICD-10-CM | POA: Insufficient documentation

## 2017-03-01 DIAGNOSIS — Z79899 Other long term (current) drug therapy: Secondary | ICD-10-CM | POA: Insufficient documentation

## 2017-03-01 DIAGNOSIS — N838 Other noninflammatory disorders of ovary, fallopian tube and broad ligament: Secondary | ICD-10-CM | POA: Insufficient documentation

## 2017-03-01 DIAGNOSIS — M4802 Spinal stenosis, cervical region: Secondary | ICD-10-CM | POA: Diagnosis not present

## 2017-03-01 DIAGNOSIS — Z800 Family history of malignant neoplasm of digestive organs: Secondary | ICD-10-CM | POA: Insufficient documentation

## 2017-03-01 DIAGNOSIS — Z823 Family history of stroke: Secondary | ICD-10-CM | POA: Insufficient documentation

## 2017-03-01 HISTORY — PX: LAPAROSCOPIC HYSTERECTOMY: SHX1926

## 2017-03-01 HISTORY — PX: ABDOMINAL HYSTERECTOMY: SHX81

## 2017-03-01 HISTORY — PX: SENTINEL NODE BIOPSY: SHX6608

## 2017-03-01 LAB — GLUCOSE, CAPILLARY
Glucose-Capillary: 81 mg/dL (ref 65–99)
Glucose-Capillary: 100 mg/dL — ABNORMAL HIGH (ref 65–99)

## 2017-03-01 SURGERY — HYSTERECTOMY, TOTAL, LAPAROSCOPIC
Anesthesia: General

## 2017-03-01 MED ORDER — INDOCYANINE GREEN 25 MG IV SOLR
INTRAVENOUS | Status: AC
  Filled 2017-03-01: qty 25

## 2017-03-01 MED ORDER — ONDANSETRON HCL 4 MG/2ML IJ SOLN
INTRAMUSCULAR | Status: AC
  Filled 2017-03-01: qty 2

## 2017-03-01 MED ORDER — DOCUSATE SODIUM 100 MG PO CAPS
100.0000 mg | ORAL_CAPSULE | Freq: Two times a day (BID) | ORAL | 0 refills | Status: AC
Start: 2017-03-01 — End: ?

## 2017-03-01 MED ORDER — CEFAZOLIN SODIUM-DEXTROSE 2-4 GM/100ML-% IV SOLN
2.0000 g | INTRAVENOUS | Status: AC
  Administered 2017-03-01: 2 g via INTRAVENOUS

## 2017-03-01 MED ORDER — SUGAMMADEX SODIUM 200 MG/2ML IV SOLN
INTRAVENOUS | Status: AC
  Filled 2017-03-01: qty 2

## 2017-03-01 MED ORDER — ACETAMINOPHEN 10 MG/ML IV SOLN
INTRAVENOUS | Status: AC
  Filled 2017-03-01: qty 100

## 2017-03-01 MED ORDER — EPHEDRINE SULFATE 50 MG/ML IJ SOLN
INTRAMUSCULAR | Status: AC
  Filled 2017-03-01: qty 1

## 2017-03-01 MED ORDER — LIDOCAINE HCL (PF) 2 % IJ SOLN
INTRAMUSCULAR | Status: AC
  Filled 2017-03-01: qty 2

## 2017-03-01 MED ORDER — SUCCINYLCHOLINE CHLORIDE 20 MG/ML IJ SOLN
INTRAMUSCULAR | Status: AC
  Filled 2017-03-01: qty 1

## 2017-03-01 MED ORDER — MIDAZOLAM HCL 2 MG/2ML IJ SOLN
INTRAMUSCULAR | Status: DC | PRN
  Administered 2017-03-01: 2 mg via INTRAVENOUS

## 2017-03-01 MED ORDER — EPHEDRINE SULFATE 50 MG/ML IJ SOLN
INTRAMUSCULAR | Status: DC | PRN
  Administered 2017-03-01: 10 mg via INTRAVENOUS

## 2017-03-01 MED ORDER — ONDANSETRON HCL 4 MG/2ML IJ SOLN
INTRAMUSCULAR | Status: DC | PRN
  Administered 2017-03-01: 4 mg via INTRAVENOUS

## 2017-03-01 MED ORDER — ROCURONIUM BROMIDE 50 MG/5ML IV SOLN
INTRAVENOUS | Status: AC
  Filled 2017-03-01: qty 1

## 2017-03-01 MED ORDER — ROCURONIUM BROMIDE 100 MG/10ML IV SOLN
INTRAVENOUS | Status: DC | PRN
  Administered 2017-03-01: 20 mg via INTRAVENOUS
  Administered 2017-03-01: 50 mg via INTRAVENOUS
  Administered 2017-03-01: 20 mg via INTRAVENOUS

## 2017-03-01 MED ORDER — FENTANYL CITRATE (PF) 250 MCG/5ML IJ SOLN
INTRAMUSCULAR | Status: AC
  Filled 2017-03-01: qty 5

## 2017-03-01 MED ORDER — OXYCODONE HCL 5 MG PO TABS
5.0000 mg | ORAL_TABLET | ORAL | Status: DC | PRN
  Administered 2017-03-01: 5 mg via ORAL

## 2017-03-01 MED ORDER — PROPOFOL 10 MG/ML IV BOLUS
INTRAVENOUS | Status: AC
  Filled 2017-03-01: qty 20

## 2017-03-01 MED ORDER — CEFAZOLIN SODIUM-DEXTROSE 2-4 GM/100ML-% IV SOLN
INTRAVENOUS | Status: AC
  Filled 2017-03-01: qty 100

## 2017-03-01 MED ORDER — ACETAMINOPHEN 500 MG PO TABS: 1000.0000 mg | ORAL_TABLET | Freq: Four times a day (QID) | ORAL | 1 refills | Status: AC

## 2017-03-01 MED ORDER — BUPIVACAINE HCL (PF) 0.5 % IJ SOLN
INTRAMUSCULAR | Status: DC | PRN
  Administered 2017-03-01: 7 mL

## 2017-03-01 MED ORDER — LIDOCAINE HCL (CARDIAC) 20 MG/ML IV SOLN
INTRAVENOUS | Status: DC | PRN
  Administered 2017-03-01: 80 mg via INTRAVENOUS

## 2017-03-01 MED ORDER — MIDAZOLAM HCL 2 MG/2ML IJ SOLN
INTRAMUSCULAR | Status: AC
  Filled 2017-03-01: qty 2

## 2017-03-01 MED ORDER — ACETAMINOPHEN 10 MG/ML IV SOLN
INTRAVENOUS | Status: DC | PRN
  Administered 2017-03-01: 1000 mg via INTRAVENOUS

## 2017-03-01 MED ORDER — SUCCINYLCHOLINE CHLORIDE 20 MG/ML IJ SOLN
INTRAMUSCULAR | Status: DC | PRN
  Administered 2017-03-01: 100 mg via INTRAVENOUS

## 2017-03-01 MED ORDER — INDOCYANINE GREEN 25 MG IV SOLR
INTRAVENOUS | Status: DC | PRN
  Administered 2017-03-01: 25 mg

## 2017-03-01 MED ORDER — THROMBIN 5000 UNITS EX SOLR
CUTANEOUS | Status: AC
  Filled 2017-03-01: qty 5000

## 2017-03-01 MED ORDER — SUGAMMADEX SODIUM 200 MG/2ML IV SOLN
INTRAVENOUS | Status: DC | PRN
  Administered 2017-03-01: 160 mg via INTRAVENOUS

## 2017-03-01 MED ORDER — IBUPROFEN 600 MG PO TABS: 600.0000 mg | ORAL_TABLET | Freq: Four times a day (QID) | ORAL | 1 refills | Status: DC

## 2017-03-01 MED ORDER — FENTANYL CITRATE (PF) 100 MCG/2ML IJ SOLN
INTRAMUSCULAR | Status: DC | PRN
  Administered 2017-03-01 (×5): 50 ug via INTRAVENOUS

## 2017-03-01 MED ORDER — BUPIVACAINE HCL (PF) 0.5 % IJ SOLN
INTRAMUSCULAR | Status: AC
  Filled 2017-03-01: qty 30

## 2017-03-01 MED ORDER — OXYCODONE HCL 5 MG PO TABS
ORAL_TABLET | ORAL | Status: AC
  Filled 2017-03-01: qty 1

## 2017-03-01 MED ORDER — PROPOFOL 10 MG/ML IV BOLUS
INTRAVENOUS | Status: DC | PRN
  Administered 2017-03-01: 200 mg via INTRAVENOUS

## 2017-03-01 MED ORDER — METHYLENE BLUE 0.5 % INJ SOLN
INTRAVENOUS | Status: AC
  Filled 2017-03-01: qty 10

## 2017-03-01 MED ORDER — OXYCODONE HCL 5 MG PO TABS: 5.0000 mg | ORAL_TABLET | ORAL | 0 refills | Status: DC | PRN

## 2017-03-01 MED ORDER — ENOXAPARIN SODIUM 40 MG/0.4ML ~~LOC~~ SOLN
40.0000 mg | SUBCUTANEOUS | Status: AC
  Administered 2017-03-01: 40 mg via SUBCUTANEOUS
  Filled 2017-03-01: qty 0.4

## 2017-03-01 MED ORDER — SODIUM CHLORIDE 0.9 % IV SOLN
INTRAVENOUS | Status: DC
  Administered 2017-03-01 (×2): via INTRAVENOUS

## 2017-03-01 SURGICAL SUPPLY — 67 items
APPLICATOR SURGIFLO ENDO (HEMOSTASIS) ×3 IMPLANT
BAG URO DRAIN 2000ML W/SPOUT (MISCELLANEOUS) ×3 IMPLANT
BLADE SURG 15 STRL LF DISP TIS (BLADE) ×2 IMPLANT
BLADE SURG 15 STRL SS (BLADE) ×1
CANISTER SUCT 1200ML W/VALVE (MISCELLANEOUS) ×3 IMPLANT
CANNULA DILATOR 10 W/SLV (CANNULA) ×3 IMPLANT
CANNULA DILATOR 5 W/SLV (CANNULA) ×3 IMPLANT
CATH FOLEY 2WAY  5CC 16FR (CATHETERS) ×1
CATH URTH 16FR FL 2W BLN LF (CATHETERS) ×2 IMPLANT
CHLORAPREP W/TINT 26ML (MISCELLANEOUS) ×3 IMPLANT
CORD MONOPOLAR M/FML 12FT (MISCELLANEOUS) ×3 IMPLANT
DEFOGGER SCOPE WARMER CLEARIFY (MISCELLANEOUS) ×3 IMPLANT
DERMABOND ADVANCED (GAUZE/BANDAGES/DRESSINGS) ×2
DERMABOND ADVANCED .7 DNX12 (GAUZE/BANDAGES/DRESSINGS) ×4 IMPLANT
DEVICE SUTURE ENDOST 10MM (ENDOMECHANICALS) ×3 IMPLANT
DRAPE LEGGINS SURG 28X43 STRL (DRAPES) ×3 IMPLANT
DRAPE STERI POUCH LG 24X46 STR (DRAPES) ×6 IMPLANT
DRSG TEGADERM 2-3/8X2-3/4 SM (GAUZE/BANDAGES/DRESSINGS) ×12 IMPLANT
ENDOSTITCH 0 SINGLE 48 (SUTURE) ×3 IMPLANT
GAUZE SPONGE NON-WVN 2X2 STRL (MISCELLANEOUS) ×4 IMPLANT
GLOVE BIO SURGEON STRL SZ8 (GLOVE) ×12 IMPLANT
GLOVE INDICATOR 8.0 STRL GRN (GLOVE) ×15 IMPLANT
GOWN STRL REUS W/ TWL LRG LVL3 (GOWN DISPOSABLE) ×4 IMPLANT
GOWN STRL REUS W/ TWL XL LVL3 (GOWN DISPOSABLE) ×2 IMPLANT
GOWN STRL REUS W/TWL LRG LVL3 (GOWN DISPOSABLE) ×2
GOWN STRL REUS W/TWL XL LVL3 (GOWN DISPOSABLE) ×1
GRASPER ENDO ROTC 5X36 (INSTRUMENTS) IMPLANT
IRRIGATION STRYKERFLOW (MISCELLANEOUS) IMPLANT
IRRIGATOR STRYKERFLOW (MISCELLANEOUS)
IV LACTATED RINGERS 1000ML (IV SOLUTION) ×3 IMPLANT
KIT PINK PAD W/HEAD ARE REST (MISCELLANEOUS) ×3
KIT PINK PAD W/HEAD ARM REST (MISCELLANEOUS) ×2 IMPLANT
LABEL OR SOLS (LABEL) ×3 IMPLANT
LIGASURE BLUNT 5MM 37CM (INSTRUMENTS) ×3 IMPLANT
MANIPULATOR VCARE LG CRV RETR (MISCELLANEOUS) IMPLANT
MANIPULATOR VCARE SML CRV RETR (MISCELLANEOUS) ×3 IMPLANT
MANIPULATOR VCARE STD CRV RETR (MISCELLANEOUS) ×3 IMPLANT
NDL INSUFF ACCESS 14 VERSASTEP (NEEDLE) ×3 IMPLANT
NDL SAFETY ECLIPSE 18X1.5 (NEEDLE) ×2 IMPLANT
NEEDLE HYPO 18GX1.5 SHARP (NEEDLE) ×1
NEEDLE SPNL 22GX5 LNG QUINC BK (NEEDLE) ×3 IMPLANT
NS IRRIG 500ML POUR BTL (IV SOLUTION) ×3 IMPLANT
OCCLUDER COLPOPNEUMO (BALLOONS) ×3 IMPLANT
PACK GYN LAPAROSCOPIC (MISCELLANEOUS) ×3 IMPLANT
PAD OB MATERNITY 4.3X12.25 (PERSONAL CARE ITEMS) ×3 IMPLANT
PAD PREP 24X41 OB/GYN DISP (PERSONAL CARE ITEMS) ×3 IMPLANT
POUCH ENDO CATCH II 15MM (MISCELLANEOUS) ×3 IMPLANT
SET CYSTO W/LG BORE CLAMP LF (SET/KITS/TRAYS/PACK) ×3 IMPLANT
SHEARS ENDO 5MM 31CM (CUTTER) ×3 IMPLANT
SPOGE SURGIFLO 8M (HEMOSTASIS) ×1
SPONGE LAP 4X18 5PK (MISCELLANEOUS) IMPLANT
SPONGE SURGIFLO 8M (HEMOSTASIS) ×2 IMPLANT
SPONGE VERSALON 2X2 STRL (MISCELLANEOUS) ×2
SUT ENDO VLOC 180-0-8IN (SUTURE) ×3 IMPLANT
SUT MNCRL 4-0 VIOLET P-3 (SUTURE) ×4 IMPLANT
SUT MONOCRYL 4-0 (SUTURE) ×2
SUT VIC AB 0 CT1 36 (SUTURE) ×3 IMPLANT
SUT VIC AB 2-0 UR6 27 (SUTURE) IMPLANT
SUT VICRYL 0 UR6 27IN ABS (SUTURE) ×3 IMPLANT
SYR 10ML LL (SYRINGE) ×3 IMPLANT
SYR 3ML LL SCALE MARK (SYRINGE) ×6 IMPLANT
SYR 50ML LL SCALE MARK (SYRINGE) ×6 IMPLANT
SYRINGE 10CC LL (SYRINGE) ×3 IMPLANT
TROCAR BLUNT TIP 12MM OMST12BT (TROCAR) ×3 IMPLANT
TROCAR VERSASTEP PLUS 12MM (TROCAR) ×3 IMPLANT
TROCAR VERSASTEP PLUS 5MM (TROCAR) ×6 IMPLANT
TUBING INSUF HEATED (TUBING) ×3 IMPLANT

## 2017-03-01 NOTE — H&P (Signed)
Preoperative History and Physical  Sandra Hendrix is a 71 y.o. [id number] here for surgical management of grade 1 endometrial cancer.   No significant preoperative concerns. She presented with postmenopausal bleeding. Her evaluation included Pap on 10/17/2016 with endometrial cells present otherwise NILM; and on 11/16/2016 endometrial biopsy that revealed atypical glandular epithelium.   Her evaluation also included pelvic US 10/17/2016  FINDINGS: Uterus: Measurements: 7.8 x 4.8 x 6.2 cm. Large 5.8 x 4.3 x 5.3 cm mass is noted within the a uterine body. Echodensities consistent calcifications noted within the mass. Finding most consistent with a fibroid. Endometrium: Thickness: Could not identified due to large uterine mass. Right ovary: Measurements: 2.6 x 1.4 x 1.4 cm. Normal appearance/no adnexal mass. Left ovary: Measurements: 4.6 x 2.4 x 4.4 cm. Normal appearance/no adnexal mass.4.3 x 2.0 x 3.9 cm simple cyst left ovary. Similar finding noted on prior CT performed on 02/21/2015 (3.4 cm simple cyst).  She underwent hysteroscopy/D&C on 01/02/2017 that revealed grade 1 endometrioid endometrial cancer.  PATHOLOGY: DIAGNOSIS:  A. ENDOCERVIX; CURETTAGE:  - FRAGMENTS OF SQUAMOUS, ENDOCERVICAL, AND ENDOMETRIAL EPITHELIUM.  - NEGATIVE FOR ATYPIA AND MALIGNANCY.   B. ENDOMETRIUM; CURETTAGE:  - ATYPICAL HYPERPLASIA,WITH A FEW FRAGMENTS SUSPICIOUS FOR  WELL-DIFFERENTIATED ENDOMETRIOID ADENOCARCINOMA.   C. ENDOMETRIUM; POLYPECTOMY:  - WELL-DIFFERENTIATED ENDOMETRIOID ADENOCARCINOMA, FIGO I. - ENDOMETRIAL POLYP FRAGMENTS WITH ATYPICAL HYPERPLASIA.    Proposed surgery: THL BSO sentinel node biopsy - pelvic bilateral  Past Medical History:  Diagnosis Date  . Allergy   . Anxiety   . Arthritis   . Barrett's esophagus determined by endoscopy   . Cancer [facility])    Uterine Cancer  . Chronic kidney disease   . Depression   . Diabetes mellitus without complication ([place])   . Difficult  intubation    "not sure what happened,asleep was told had problem with intubation"  . Diverticulosis   . GERD (gastroesophageal reflux disease)   . History of bronchitis   . History of hiatal hernia   . Hyperlipidemia   . Hypertension   . Sleep apnea    Use C- PAP  . Urinary incontinence   . Varicose veins    Past Surgical History:  Procedure Laterality Date  . CHOLECYSTECTOMY    . COLONOSCOPY WITH PROPOFOL N/A 06/14/2016   Procedure: COLONOSCOPY WITH PROPOFOL;  Surgeon: Lollie Sails, MD;  Location: [facility] ENDOSCOPY;  Service: Endoscopy;  Laterality: N/A;  . ESOPHAGOGASTRODUODENOSCOPY (EGD) WITH PROPOFOL N/A 06/14/2016   Procedure: ESOPHAGOGASTRODUODENOSCOPY (EGD) WITH PROPOFOL;  Surgeon: Lollie Sails, MD;  Location: [facility] ENDOSCOPY;  Service: Endoscopy;  Laterality: N/A;  . HYSTEROSCOPY W/D&C N/A 01/02/2017   Procedure: DILATATION AND CURETTAGE /HYSTEROSCOPY;  Surgeon: Brayton Mars, MD;  Location: [facility] ORS;  Service: Gynecology;  Laterality: N/A;  . JOINT REPLACEMENT Right 2007   Total Knee Replacement  . JOINT REPLACEMENT Left 2004   Total Knee Replacement  . ROTATOR CUFF REPAIR Right   . TONSILLECTOMY     as a child  . TUBAL LIGATION    . UPPER GI ENDOSCOPY     x2   OB History  Gravida Para Term Preterm AB Living  2 2 2     2   SAB TAB Ectopic Multiple Live Births          2    # Outcome Date GA Lbr Len/2nd Weight Sex Delivery Anes PTL Lv  2 Term 1972   4.082 kg (9 lb) F Vag-Spont   LIV  1 Term 79  4.082 kg (9 lb) F Vag-Spont   LIV    Patient denies any other pertinent gynecologic issues.   No current facility-administered medications on file prior to encounter.    Current Outpatient Prescriptions on File Prior to Encounter  Medication Sig Dispense Refill  . albuterol (PROVENTIL HFA;VENTOLIN HFA) 108 (90 BASE) MCG/ACT inhaler Inhale 2 puffs into the lungs every 6 (six) hours as needed for wheezing or shortness of breath.     . Ascorbic Acid (VITAMIN  C) 1000 MG tablet Take 1,000 mg by mouth every evening.     Marland Kitchen aspirin EC 81 MG tablet Take 81 mg by mouth every evening.     Marland Kitchen atorvastatin (LIPITOR) 20 MG tablet Take 20 mg by mouth at bedtime.     Marland Kitchen azelastine (ASTELIN) 0.1 % nasal spray Place 1 spray into both nostrils daily.    . Biotin 1000 MCG tablet Take 1,000 mcg by mouth 2 (two) times daily.    . Cholecalciferol (D3-1000 PO) Take 1 tablet by mouth daily.     Marland Kitchen CINNAMON PO Take 1,000 mg by mouth 2 (two) times daily.    . fish oil-omega-3 fatty acids 1000 MG capsule Take 1 g by mouth every evening.     . fluticasone (FLONASE) 50 MCG/ACT nasal spray Place 1 spray into both nostrils daily. 48 g 1  . furosemide (LASIX) 20 MG tablet Take 1 tablet by mouth  every other day (Patient taking differently: Take 1 tablet by mouth every other day) 45 tablet 2  . insulin detemir (LEVEMIR) 100 UNIT/ML injection Inject 0.15 mLs (15 Units total) into the skin at bedtime. (Patient taking differently: Inject 20 Units into the skin at bedtime. ) 10 mL 11  . loratadine (CLARITIN) 10 MG tablet Take 10 mg by mouth daily.    Marland Kitchen losartan-hydrochlorothiazide (HYZAAR) 50-12.5 MG tablet Take 1 tablet by mouth daily. 90 tablet 0  . metFORMIN (GLUCOPHAGE-XR) 750 MG 24 hr tablet Take 2 tablets by mouth  daily with breakfast 180 tablet 2  . Multiple Vitamin (MULTIVITAMIN) tablet Take 1 tablet by mouth daily.    Marland Kitchen omeprazole (PRILOSEC) 20 MG capsule Take 1 capsule by mouth  daily 90 capsule 2  . Potassium 99 MG TABS Take 99 mg by mouth daily.     . Probiotic Product (PROBIOTIC-10) CAPS Take 1 capsule by mouth daily.     Marland Kitchen senna-docusate (SENOKOT-S) 8.6-50 MG per tablet Take 1 tablet by mouth daily. 30 tablet 0  . sertraline (ZOLOFT) 50 MG tablet Take 1 tablet by mouth  daily 90 tablet 2  . verapamil (VERELAN PM) 180 MG 24 hr capsule Take 1 capsule by mouth  daily 90 capsule 2  . ACCU-CHEK AVIVA PLUS test strip Test once daily 100 each 2  . Lancet Devices (ACCU-CHEK  SOFTCLIX) lancets Use to test blood sugars 1 -2 times daily 1 each 3  . Meclizine HCl 25 MG CHEW Chew 25 mg by mouth daily as needed (for vertigo).      Allergies  Allergen Reactions  . Clindamycin/Lincomycin Rash  . Codeine Rash  . Morphine And Related Nausea Only  . Talwin [Pentazocine] Nausea And Vomiting  . Lisinopril Cough    cough  . Oxycodone-Acetaminophen Other (See Comments)    Pt states this medication makes her bowels stop moving    Social History:   reports that she has never smoked. She has never used smokeless tobacco. She reports that she does not drink alcohol or use drugs.  Family History  Problem Relation Age of Onset  . Stroke Mother   . Hypertension Mother   . Lupus Mother   . Alcohol abuse Father   . Diabetes Father   . Cancer Father        gallbladder ca into liver   . Arthritis Sister   . Diabetes Sister   . Hyperlipidemia Daughter   . Hypertension Daughter   . Cancer Maternal Aunt        ovarian ca  . Diabetes Paternal Aunt   . Drug abuse Paternal Aunt   . Cancer Maternal Grandfather        colon ca    Review of Systems: Noncontributory  PHYSICAL EXAM: Blood pressure 131/80, pulse 83, temperature 97.1 F (36.2 C), temperature source Tympanic, resp. rate 16, SpO2 97 %. General appearance - alert, well appearing, and in no distress Chest - clear to auscultation, no wheezes, rales or rhonchi, symmetric air entry Heart - normal rate and regular rhythm Abdomen - soft, nontender, nondistended, no masses or organomegaly Pelvic - examination not indicated Extremities - peripheral pulses normal, no pedal edema, no clubbing or cyanosis  Labs: Results for orders placed or performed during the hospital encounter of 03/01/17 (from the past 336 hour(s))  Glucose, capillary   Collection Time: 03/01/17 10:10 AM  Result Value Ref Range   Glucose-Capillary 81 65 - 99 mg/dL  Results for orders placed or performed in visit on 02/27/17 (from the past 336  hour(s))  Comprehensive metabolic panel   Collection Time: 02/27/17 11:17 AM  Result Value Ref Range   Sodium 138 135 - 145 mmol/L   Potassium 3.8 3.5 - 5.1 mmol/L   Chloride 107 101 - 111 mmol/L   CO2 23 22 - 32 mmol/L   Glucose, Bld 109 (H) 65 - 99 mg/dL   BUN 28 (H) 6 - 20 mg/dL   Creatinine, Ser 1.05 (H) 0.44 - 1.00 mg/dL   Calcium 9.2 8.9 - 10.3 mg/dL   Total Protein 7.4 6.5 - 8.1 g/dL   Albumin 4.0 3.5 - 5.0 g/dL   AST 26 15 - 41 U/L   ALT 19 14 - 54 U/L   Alkaline Phosphatase 88 38 - 126 U/L   Total Bilirubin 0.6 0.3 - 1.2 mg/dL   GFR calc non Af Amer 52 (L) >60 mL/min   GFR calc Af Amer 60 (L) >60 mL/min   Anion gap 8 5 - 15  CBC with Differential/Platelet   Collection Time: 02/27/17 11:17 AM  Result Value Ref Range   WBC 4.7 3.6 - 11.0 K/uL   RBC 4.22 3.80 - 5.20 MIL/uL   Hemoglobin 12.9 12.0 - 16.0 g/dL   HCT 37.4 35.0 - 47.0 %   MCV 88.5 80.0 - 100.0 fL   MCH 30.5 26.0 - 34.0 pg   MCHC 34.5 32.0 - 36.0 g/dL   RDW 14.6 (H) 11.5 - 14.5 %   Platelets 276 150 - 440 K/uL   Neutrophils Relative % 69 %   Neutro Abs 3.3 1.4 - 6.5 K/uL   Lymphocytes Relative 21 %   Lymphs Abs 1.0 1.0 - 3.6 K/uL   Monocytes Relative 6 %   Monocytes Absolute 0.3 0.2 - 0.9 K/uL   Eosinophils Relative 3 %   Eosinophils Absolute 0.1 0 - 0.7 K/uL   Basophils Relative 1 %   Basophils Absolute 0.0 0 - 0.1 K/uL  Type and screen   Collection Time: 02/27/17 11:34 AM  Result Value Ref  Range   ABO/RH(D) O POS    Antibody Screen NEG    Sample Expiration 03/02/2017   Results for orders placed or performed in visit on 02/22/17 (from the past 336 hour(s))  Comprehensive metabolic panel   Collection Time: 02/22/17  9:11 AM  Result Value Ref Range   Sodium 139 135 - 145 mEq/L   Potassium 4.0 3.5 - 5.1 mEq/L   Chloride 106 96 - 112 mEq/L   CO2 25 19 - 32 mEq/L   Glucose, Bld 111 (H) 70 - 99 mg/dL   BUN 22 6 - 23 mg/dL   Creatinine, Ser 1.04 0.40 - 1.20 mg/dL   Total Bilirubin 0.4 0.2 - 1.2  mg/dL   Alkaline Phosphatase 107 39 - 117 U/L   AST 19 0 - 37 U/L   ALT 16 0 - 35 U/L   Total Protein 7.4 6.0 - 8.3 g/dL   Albumin 4.4 3.5 - 5.2 g/dL   Calcium 9.7 8.4 - 10.5 mg/dL   GFR 55.23 (L) >60.00 mL/min  Hemoglobin A1c   Collection Time: 02/22/17  9:11 AM  Result Value Ref Range   Hgb A1c MFr Bld 7.8 (H) 4.6 - 6.5 %  LDL cholesterol, direct   Collection Time: 02/22/17  9:11 AM  Result Value Ref Range   Direct LDL 80.0 mg/dL  Lipid panel   Collection Time: 02/22/17  9:11 AM  Result Value Ref Range   Cholesterol 165 0 - 200 mg/dL   Triglycerides 67.0 0.0 - 149.0 mg/dL   HDL 67.50 >39.00 mg/dL   VLDL 13.4 0.0 - 40.0 mg/dL   LDL Cholesterol 84 0 - 99 mg/dL   Total CHOL/HDL Ratio 2    NonHDL 97.08   Microalbumin / creatinine urine ratio   Collection Time: 02/22/17  9:11 AM  Result Value Ref Range   Microalb, Ur 2.2 (H) 0.0 - 1.9 mg/dL   Creatinine,U 106.3 mg/dL   Microalb Creat Ratio 2.1 0.0 - 30.0 mg/g    Imaging Studies: Dg Chest 2 View  Result Date: 01/30/2017 CLINICAL DATA:  Endometrial cancer schedule for surgery Feb 01, 2017. EXAM: CHEST  2 VIEW COMPARISON:  July 12, 2010 FINDINGS: The heart size and mediastinal contours are within normal limits. Both lungs are clear. Degenerative joint changes of the spine are noted. Prior cholecystectomy clips are noted. IMPRESSION: No active cardiopulmonary disease. Electronically Signed   By: Abelardo Diesel M.D.   On: 01/30/2017 19:02    Assessment: Patient Active Problem List   Diagnosis Date Noted  . Endometrioid adenocarcinoma of uterus ([place]) 01/19/2017  . Status post D&C 01/02/2017  . OSA on CPAP 11/24/2016  . Cyst of ovary 11/16/2016  . Uterine leiomyoma 11/16/2016  . Cervical stenosis (uterine cervix) 11/16/2016  . Snoring 07/31/2016  . Cyst, vulva 10/11/2015  . CKD (chronic kidney disease) stage 3, GFR 30-59 ml/min 11/07/2014  . Acid reflux 12/26/2013  . Irritable bowel syndrome with constipation 12/26/2013  .  Hyperlipidemia LDL goal <100 07/15/2013  . Obesity 07/15/2013  . Chronic venous insufficiency 03/21/2013  . DM (diabetes mellitus), type 2 with renal complications ([place]) [id number]  . Lack of libido 11/05/2012  . Barrett's esophagus 11/05/2012  . Hiatal hernia 11/05/2012  . Essential hypertension 11/05/2012  . Urinary incontinence     Plan: Patient will undergo surgical management with TLH BSO pelvic sentinel node biopsy.   The risks of surgery were discussed in detail with the patient including but not limited to: bleeding which may require  transfusion or reoperation; infection which may require antibiotics; injury to surrounding organs which may involve bowel, bladder, ureters ; need for additional procedures including laparoscopy or laparotomy; thromboembolic phenomenon, surgical site problems and other postoperative/anesthesia complications. Likelihood of success in alleviating the patient's condition was discussed. Routine postoperative instructions will be reviewed with the patient and her family in detail after surgery.  The patient concurred with the proposed plan, giving informed written consent for the surgery.  Patient has been NPO since last night she will remain NPO for procedure.  Anesthesia and OR aware.  Preoperative prophylactic antibiotics and SCDs ordered on call to the OR.  To OR when ready.  ----- Larey Days, MD Attending Obstetrician and Gynecologist [facility], Department of [facility]

## 2017-03-01 NOTE — Transfer of Care (Signed)
Immediate Anesthesia Transfer of Care Note  Patient: Sandra Hendrix  Procedure(s) Performed: Procedure(s): HYSTERECTOMY TOTAL LAPAROSCOPIC BSO (Bilateral) SENTINEL NODE BIOPSY (N/A)  Patient Location: PACU  Anesthesia Type:General  Level of Consciousness: awake, oriented and patient cooperative  Airway & Oxygen Therapy: Patient Spontanous Breathing and Patient connected to face mask oxygen  Post-op Assessment: Report given to RN, Post -op Vital signs reviewed and stable and Patient moving all extremities X 4  Post vital signs: Reviewed and stable  Last Vitals:  Vitals:   03/01/17 1015  BP: 131/80  Pulse: 83  Resp: 16  Temp: 36.2 C    Last Pain:  Vitals:   03/01/17 1015  TempSrc: Tympanic         Complications: No apparent anesthesia complications

## 2017-03-01 NOTE — Discharge Instructions (Signed)
Discharge instructions:  Call office if you have any of the following: fever >101 F, chills, excessive vaginal bleeding, incision drainage or problems, leg pain or redness, or any other concerns.   Activity: Do not lift > 10 lbs for 8 weeks.  No intercourse or tampons for 8 weeks.  No driving for 1-2 weeks.   You may feel some pain in your upper right abdomen/rib and right shoulder.  This is from the gas in the abdomen for surgery. This will subside over time, please be patient!  Take 600mg  Ibuprofen and 1000mg  Tylenol around the clock, every 6 hours for at least the first 3-5 days.  After this you can take as needed.  This will help decrease inflammation and promote healing.  The narcotics you'll take just as needed, as they just trick your brain into thinking its not in pain.    IT IS VERY VERY IMPORTANT THAT YOU OPTIMIZE YOUR GLUCOSE DURING THE RECOVERY PERIOD.  Please keep your glucose levels at target or below, using your insulin and other medications as instructed.  Having sugar levels out of control will delay your healing and potentially cause your incisions to break down.  Please don't limit yourself in terms of routine activity.  You will be able to do most things, although they may take longer to do or be a little painful.  You can do it!  Don't be a hero, but don't be a wimp either!    AMBULATORY SURGERY  DISCHARGE INSTRUCTIONS   1) The drugs that you were given will stay in your system until tomorrow so for the next 24 hours you should not:  A) Drive an automobile B) Make any legal decisions C) Drink any alcoholic beverage   2) You may resume regular meals tomorrow.  Today it is better to start with liquids and gradually work up to solid foods.  You may eat anything you prefer, but it is better to start with liquids, then soup and crackers, and gradually work up to solid foods.   3) Please notify your doctor immediately if you have any unusual bleeding, trouble  breathing, redness and pain at the surgery site, drainage, fever, or pain not relieved by medication.    4) Additional Instructions: TAKE A STOOL SOFTENER TWICE A DAY WHILE TAKING NARCOTIC PAIN MEDICINE TO PREVENT CONSTIPATION   Please contact your physician with any problems or Same Day Surgery at [phone], Monday through Friday 6 am to 4 pm, or [place] at [facility] number at [phone].

## 2017-03-01 NOTE — Anesthesia Procedure Notes (Signed)
Procedure Name: Intubation Date/Time: 03/01/2017 11:58 AM Performed by: Silvana Newness Pre-anesthesia Checklist: Patient identified, Emergency Drugs available, Suction available, Patient being monitored and Timeout performed Patient Re-evaluated:Patient Re-evaluated prior to inductionOxygen Delivery Method: Circle system utilized Preoxygenation: Pre-oxygenation with 100% oxygen Intubation Type: IV induction Ventilation: Mask ventilation without difficulty Laryngoscope Size: McGraph and 3 Grade View: Grade I Tube type: Oral Tube size: 7.0 mm Number of attempts: 1 Airway Equipment and Method: Stylet Placement Confirmation: ETT inserted through vocal cords under direct vision,  positive ETCO2 and breath sounds checked- equal and bilateral Secured at: 20 cm Tube secured with: Tape Dental Injury: Teeth and Oropharynx as per pre-operative assessment

## 2017-03-01 NOTE — Anesthesia Postprocedure Evaluation (Signed)
Anesthesia Post Note  Patient: Sandra Hendrix  Procedure(s) Performed: Procedure(s) (LRB): HYSTERECTOMY TOTAL LAPAROSCOPIC BSO (Bilateral) SENTINEL NODE INJECTION AND BIOPSY (N/A)  Patient location during evaluation: PACU Anesthesia Type: General Level of consciousness: awake Pain management: pain level controlled Vital Signs Assessment: post-procedure vital signs reviewed and stable Respiratory status: spontaneous breathing Cardiovascular status: stable Anesthetic complications: no     Last Vitals:  Vitals:   03/01/17 1426 03/01/17 1441  BP: (!) 115/55 (!) 94/50  Pulse: 76 72  Resp: 18 13  Temp: 36.3 C     Last Pain:  Vitals:   03/01/17 1441  TempSrc:   PainSc: 0-No pain                 VAN STAVEREN,Nabil Bubolz

## 2017-03-01 NOTE — Anesthesia Preprocedure Evaluation (Signed)
Anesthesia Evaluation  Patient identified by MRN, date of birth, ID band Patient awake    Reviewed: Allergy & Precautions, NPO status , Patient's Chart, lab work & pertinent test results  History of Anesthesia Complications (+) DIFFICULT AIRWAY  Airway Mallampati: III       Dental  (+) Teeth Intact   Pulmonary sleep apnea ,    breath sounds clear to auscultation       Cardiovascular Exercise Tolerance: Good hypertension, Pt. on medications  Rhythm:Regular Rate:Normal     Neuro/Psych    GI/Hepatic Neg liver ROS, hiatal hernia, GERD  ,  Endo/Other  diabetes, Type 2, Oral Hypoglycemic Agents  Renal/GU      Musculoskeletal   Abdominal Normal abdominal exam  (+)   Peds negative pediatric ROS (+)  Hematology   Anesthesia Other Findings   Reproductive/Obstetrics                             Anesthesia Physical Anesthesia Plan  ASA: III  Anesthesia Plan: General   Post-op Pain Management:    Induction: Intravenous  PONV Risk Score and Plan: 3 and Ondansetron, Dexamethasone, Propofol, Midazolam and Treatment may vary due to age  Airway Management Planned:   Additional Equipment:   Intra-op Plan:   Post-operative Plan: Extubation in OR  Informed Consent: I have reviewed the patients History and Physical, chart, labs and discussed the procedure including the risks, benefits and alternatives for the proposed anesthesia with the patient or authorized representative who has indicated his/her understanding and acceptance.     Plan Discussed with: CRNA  Anesthesia Plan Comments:         Anesthesia Quick Evaluation

## 2017-03-01 NOTE — Op Note (Signed)
Operative Note   03/01/2017 2:23 PM  PRE-OP DIAGNOSIS: ENDOMETRIAL CANCER GRADE 1   POST-OP DIAGNOSIS: ENDOMETRIAL CANCER GRADE 1  SURGEON: Surgeon(s) and Role:    * Mellody Drown, MD - Primary    * Ward, Honor Loh, MD - Assisting  ANESTHESIA: General ET   PROCEDURE:  LAPAROSCOPIC HYSTERECTOMY TOTAL, BSO, BILATERAL SENTINEL NODE MAPPING AND BIOPSY   ESTIMATED BLOOD LOSS: less than 50 mL  DRAINS: NONE   TOTAL IV FLUIDS: per anesthesia  SPECIMENS: Uterus, fallopian tubes, ovaries and bilateral pelvic sentinel lymph nodes   COMPLICATIONS: none  DISPOSITION: PACU - hemodynamically stable.  CONDITION: stable  INDICATIONS: Grade 1 uterine cancer  FINDINGS: Normal uterus, tubes and ovaries.  Normal upper abdominal survey.  Sentinel nodes mapped to the external iliac vessels bilaterally.  There were three green nodes on the left and one on the right.     PROCEDURE IN DETAIL: After informed consent was obtained, the patient was taken to the operating room where anesthesia was obtained without difficulty. The patient was positioned in the dorsal lithotomy position in [place] and her arms were carefully tucked at her sides and the usual precautions were taken.  She was prepped and draped in normal sterile fashion.  Time-out was performed and a Foley catheter was placed into the bladder and the cervix was infiltrated with 4 ml of ICG dye at 3 an 9 o'clock both superficial and deep injections. A standard VCare uterine manipulator was then placed in the uterus without incident.    An open Hasson technique was used to place an infraumbilical 00-XF baloon trocar under direct visualization. The laparoscope was introduced and CO2 gas was infused for pneumoperitoneum to a pressure of 15 mm Hg.  Right and left lateral 5-mm ports and a 5-12 mm suprapubic port were placed under direct visualization of the laparoscope using an EndoStep technique.  Cytologic washings were obtained.  The  patient was placed in Trendelenburg and the bowel was displaced up into the upper abdomen.  Round ligaments were divided on each side with the EndoShears and the retroperitoneal space was opened bilaterally.  The ureters were identified and preserved.  At this point the retroperitoneal spaces were developed and the lymphatic channels were mapped to each side.  The sentinel node on the right side was then identified, skeletonized and removed taking care not to injure the  obturator nerve, the ureter or the pelvic vasculature.  Similarly on the left side, the retroperitoneal spaces were developed, the lymphatic channels mapped to identify the three sentinel nodes and they were similarly removed with care to preserve the obturator nerve, the ureter and the pelvic vasculature. With hemostasis secured, the infundibulopelvic ligaments were skeletonized, sealed and divided with the LigaSure device.  A bladder flap was created and the bladder was dissected down off the lower uterine segment and cervix using endoshears and electrocautery.  The uterine arteries were skeletonized bilaterally, sealed and divided with the LigaSure device.  A colpotomy was performed circumferentially along the V-Care ring with electrocautery and the cervix was incised from the vagina and the specimen was removed through the vagina.  A pneumo balloon was placed in the vagina and the vaginal cuff was then closed in a running continuous fashion using the EndoStitch technique with 0 V-Lock suture with careful attention to include the vaginal cuff angles and the vaginal mucosa within the closure.  Floseal was placed on the vaginal cuff for further hemostasis. Frozen section was not performed.   Hemostasis  was observed. The intraperitoneal pressure was dropped, and all planes of dissection, vascular pedicles and the vaginal cuff were found to be hemostatic.  The suprapubic trocar was removed and the fascia was closed with 0 Vicryl suture using the  Endoclose technique. The lateral trocars were removed under visualization.   Before the umbilical trocar was removed the CO2 gas was released.  The fascia there was closed with 0 Vicryl suture in interrupted technique.  The skin incisions were closed with subcuticular stitches of 4-0 Biosyn and glue.  The patient tolerated the procedure well.  Sponge, lap and needle counts were correct.  The patient was taken to recovery room in excellent condition.  Antibiotics: 2 gm Ancef given prior to incision.   VTE prophylaxis: was ordered perioperatively with Lovonox and IPCs.  Mellody Drown, MD

## 2017-03-01 NOTE — Anesthesia Post-op Follow-up Note (Cosign Needed)
Anesthesia QCDR form completed.        

## 2017-03-01 NOTE — Interval H&P Note (Signed)
History and Physical Interval Note:  03/01/2017 11:40 AM  Sandra Hendrix  has presented today for surgery, with the diagnosis of ENDOMETRIAL CANCER  The various methods of treatment have been discussed with the patient and family. After consideration of risks, benefits and other options for treatment, the patient has consented to  Procedure(s): HYSTERECTOMY TOTAL LAPAROSCOPIC BSO (Bilateral) SENTINEL NODE BIOPSY (N/A) as a surgical intervention .  The patient's history has been reviewed, patient examined, no change in status, stable for surgery.  I have reviewed the patient's chart and labs.  Questions were answered to the patient's satisfaction.     Mellody Drown

## 2017-03-01 NOTE — OR Nursing (Signed)
Dr Fransisca Connors in to see patient  review surgery instructions blood sugars with patient. Patient verbalized understanding.

## 2017-03-02 ENCOUNTER — Other Ambulatory Visit: Payer: Self-pay | Admitting: Internal Medicine

## 2017-03-02 ENCOUNTER — Encounter: Payer: Self-pay | Admitting: Obstetrics and Gynecology

## 2017-03-03 ENCOUNTER — Other Ambulatory Visit: Payer: Self-pay | Admitting: Internal Medicine

## 2017-03-06 LAB — CYTOLOGY - NON PAP

## 2017-03-09 ENCOUNTER — Other Ambulatory Visit: Payer: Self-pay | Admitting: Pathology

## 2017-03-10 ENCOUNTER — Telehealth: Payer: Self-pay

## 2017-03-10 NOTE — Telephone Encounter (Signed)
  Oncology Nurse Navigator Documentation Voicemail left on triage line from Ms. Segreto that she had quit bleeding after her surgery that was performed 03-01-17. Voicemail indicates that bleeding has resumed with clots. Attempted to call. Voicemail left to return call. Navigator Location: [facility] (03/10/17 1446)   )Navigator Encounter Type: Telephone (03/10/17 1446) Telephone: Symptom Mgt (03/10/17 1446)                                                  Time Spent with Patient: 15 (03/10/17 1446)

## 2017-03-10 NOTE — Progress Notes (Signed)
  Oncology Nurse Navigator Documentation Surgical pathology results sent to Dr. Fransisca Connors for review. Navigator Location: [facility] (03/10/17 1400)   )Navigator Encounter Type: Diagnostic Results (03/10/17 1400)                                                    Time Spent with Patient: 15 (03/10/17 1400)

## 2017-03-10 NOTE — Telephone Encounter (Signed)
  Oncology Nurse Navigator Documentation Ms. Ungerer returned call. She states she had only slight bleeding after surgery that stopped after 2 days. Then on 6/12 she started having bright red blood when she wiped and a small amount on her pad. Has not saturated a pad. Reports it's mainly when she wipes after going to urinate. Was passing clots and she has not seen any since this am. Spoke with Dr. Fransisca Connors and if bleeding gets worse or continues she needs to call Dr. Guido Sander office for further direction as they may  want to examine her in the office. Provided phone number to call. [phone]. Navigator Location: [facility] (03/10/17 1500)   )Navigator Encounter Type: Telephone (03/10/17 1500) Telephone: Symptom Mgt (03/10/17 1500)                                                  Time Spent with Patient: 15 (03/10/17 1500)

## 2017-03-13 ENCOUNTER — Telehealth: Payer: Self-pay

## 2017-03-13 NOTE — Telephone Encounter (Signed)
  Oncology Nurse Navigator Documentation Follow up call made regarding bleeding Sandra Hendrix reported Friday. She states bleeding has stopped. New follow up appointment made for 6/27 with Dr. Fransisca Connors for pathology review from 6/6 surgery. Navigator Location: [facility] (03/13/17 1000)   )Navigator Encounter Type: Telephone (03/13/17 1000) Telephone: Symptom Mgt;Appt Confirmation/Clarification;Outgoing Call (03/13/17 1000)                                                  Time Spent with Patient: 15 (03/13/17 1000)

## 2017-03-22 ENCOUNTER — Inpatient Hospital Stay: Payer: Medicare Other | Admitting: Obstetrics and Gynecology

## 2017-03-22 VITALS — BP 119/74 | HR 71 | Temp 98.2°F | Resp 18 | Ht 61.0 in | Wt 178.2 lb

## 2017-03-22 DIAGNOSIS — C55 Malignant neoplasm of uterus, part unspecified: Secondary | ICD-10-CM

## 2017-03-22 NOTE — Progress Notes (Signed)
Gynecologic Oncology Consult Visit   Referring Provider: Brayton Mars, MD  Chief Concern: Stage IB WELL-DIFFERENTIATED ENDOMETRIOID ADENOCARCINOMA of the endometrium  Subjective:  Sandra Hendrix is a 71 y.o. female who is seen in consultation from Dr. Enzo Bi for grade 1 endometrial cancer.  Patient returns today for post op exam and discussion of path.  Has some continued SUI symptoms.  Also some numbness in left anterior thigh likely due to node biopsies.   Cancer history She presented with postmenopausal bleeding. Her evaluation included Pap on 10/17/2016 with endometrial cells present otherwise NILM; and on 11/16/2016 endometrial biopsy that revealed atypical glandular epithelium.   Her evaluation also included pelvic US 10/17/2016 that showed uterine mass.  She underwent hysteroscopy/D&C on 01/02/2017 that revealed grade 1 endometrioid endometrial cancer.  Definitive cancer surgery was scheduled 5/9, but was delayed a month because her brother died the day before.    Underwent TLH, BSO, SLN mapping and biopsieson 03/01/17  DIAGNOSIS:  A. SENTINEL LYMPH NODE 1, LEFT EXTERNAL ILIAC; EXCISION:  - NO TUMOR SEEN IN ONE LYMPH NODE (0/1).   B. SENTINEL LYMPH NODE 2, LEFT EXTERNAL ILIAC; EXCISION:  - NO TUMOR SEEN IN ONE LYMPH NODE (0/1).   C. SENTINEL LYMPH NODE 3, LEFT EXTERNAL ILIAC; EXCISION:  - NO TUMOR SEEN IN ONE LYMPH NODE (0/1).   D. SENTINEL LYMPH NODE, RIGHT EXTERNAL ILIAC; EXCISION:  - NO TUMOR SEEN IN ONE LYMPH NODE (0/1).   E. UTERUS WITH CERVIX, BILATERAL FALLOPIAN TUBES AND OVARIES;  LAPAROSCOPIC TOTAL HYSTERECTOMY AND BILATERAL SALPINGO-OOPHORECTOMY:  - ENDOMETRIOID CARCINOMA WITH SQUAMOUS DIFFERENTIATION, FIGO GRADE I.  - INVASION EXTENDS INTO THE OUTER HALF OF THE MYOMETRIUM.  - AGE-RELATED CHANGE, BILATERAL OVARIES.  - PARATUBAL CYSTS, BILATERAL FALLOPIAN TUBES.  - SEE SUMMARY BELOW.   Surgical Pathology Cancer Case Summary  ENDOMETRIUM:   Procedure: Laparoscopic total hysterectomy, bilateral  salpingo-oophorectomy and sentinel lymph node excision  Histologic Type: Endometrioid carcinoma with squamous differentiation  Histologic Grade: FIGO grade 1  Myometrial Invasion: Present; Depth of invasion: 26.75 mm; Myometrial  thickness (millimeters): 27 mm; Percentage of myometrial invasion: 99%  Uterine Serosa Involvement: Not identified  Cervical Stromal Involvement: Not identified  Other Tissue/Organ Involvement: Not identified  Peritoneal/ Ascitic Fluid: Positive for malignancy  Margins: Not applicable  Lymphovascular Invasion: Not identified  Regional Lymph Nodes:  # of pelvicnodes with macrometastasis: 0  # of pelvic nodes with micrometastasis: 0  Total # of pelvic nodes examined: 4  # of pelvic sentinel nodes examined: 4  Laterality of pelvic nodes examined: left and right  Pathologic Stage Classification (pTNM, AJCC 8th Edition): pT1b pN0 (sn)  / FIGO IB   Problem List: Patient Active Problem List   Diagnosis Date Noted  . Endometrioid adenocarcinoma of uterus ([place]) 01/19/2017  . Status post D&C 01/02/2017  . OSA on CPAP 11/24/2016  . Cyst of ovary 11/16/2016  . Uterine leiomyoma 11/16/2016  . Cervical stenosis (uterine cervix) 11/16/2016  . Snoring 07/31/2016  . Cyst, vulva 10/11/2015  . CKD (chronic kidney disease) stage 3, GFR 30-59 ml/min 11/07/2014  . Acid reflux 12/26/2013  . Irritable bowel syndrome with constipation 12/26/2013  . Hyperlipidemia LDL goal <100 07/15/2013  . Obesity 07/15/2013  . Chronic venous insufficiency 03/21/2013  . DM (diabetes mellitus), type 2 with renal complications ([place]) 33/82/5053  . Lack of libido 11/05/2012  . Barrett's esophagus 11/05/2012  . Hiatal hernia 11/05/2012  . Essential hypertension 11/05/2012  . Urinary incontinence  Past Medical History: Past Medical History:  Diagnosis Date  . Allergy   . Anxiety   . Arthritis   . Barrett's esophagus  determined by endoscopy   . Cancer [facility])    Uterine Cancer  . Chronic kidney disease   . Depression   . Diabetes mellitus without complication ([place])   . Difficult intubation    "not sure what happened,asleep was told had problem with intubation"  . Diverticulosis   . GERD (gastroesophageal reflux disease)   . History of bronchitis   . History of hiatal hernia   . Hyperlipidemia   . Hypertension   . Sleep apnea    Use C- PAP  . Urinary incontinence   . Varicose veins     Past Surgical History: Past Surgical History:  Procedure Laterality Date  . CHOLECYSTECTOMY    . COLONOSCOPY WITH PROPOFOL N/A 06/14/2016   Procedure: COLONOSCOPY WITH PROPOFOL;  Surgeon: Lollie Sails, MD;  Location[facility] ENDOSCOPY;  Service: Endoscopy;  Laterality: N/A;  . ESOPHAGOGASTRODUODENOSCOPY (EGD) WITH PROPOFOL N/A 06/14/2016   Procedure: ESOPHAGOGASTRODUODENOSCOPY (EGD) WITH PROPOFOL;  Surgeon: Lollie Sails, MD;  Location: [facility] ENDOSCOPY;  Service: Endoscopy;  Laterality: N/A;  . HYSTEROSCOPY W/D&C N/A 01/02/2017   Procedure: DILATATION AND CURETTAGE /HYSTEROSCOPY;  Surgeon: Brayton Mars, MD;  Location: [facility] ORS;  Service: Gynecology;  Laterality: N/A;  . JOINT REPLACEMENT Right 2007   Total Knee Replacement  . JOINT REPLACEMENT Left 2004   Total Knee Replacement  . LAPAROSCOPIC HYSTERECTOMY Bilateral 03/01/2017   Procedure: HYSTERECTOMY TOTAL LAPAROSCOPIC BSO;  Surgeon: Mellody Drown, MD;  Location: [facility] ORS;  Service: Gynecology;  Laterality: Bilateral;  . ROTATOR CUFF REPAIR Right   . SENTINEL NODE BIOPSY N/A 03/01/2017   Procedure: SENTINEL NODE INJECTION AND BIOPSY;  Surgeon: Mellody Drown, MD;  Location: [facility] ORS;  Service: Gynecology;  Laterality: N/A;  . TONSILLECTOMY     as a child  . TUBAL LIGATION    . UPPER GI ENDOSCOPY     x2    Past Gynecologic History:  Patient is postmenopausal. Menarche: 8 History of Abnormal pap: yes, endometrial cells o/w NILM Last pap:  2018 as per interval history  Last Pap:10/17/2016 negative for intraepithelial lesions or malignancy. Benign reparative/reactive changes. ENDOMETRIAL CELLS PRESENT Sexually active: no  She is up to date on her mammograms, 2017 per her report  OB History:  OB History  Gravida Para Term Preterm AB Living  2 2 2     2   SAB TAB Ectopic Multiple Live Births          2    # Outcome Date GA Lbr Len/2nd Weight Sex Delivery Anes PTL Lv  2 Term 1972   9 lb (4.082 kg) F Vag-Spont   LIV  1 Term 1970   9 lb (4.082 kg) F Vag-Spont   LIV      Family History: Family History  Problem Relation Age of Onset  . Stroke Mother   . Hypertension Mother   . Lupus Mother   . Alcohol abuse Father   . Diabetes Father   . Cancer Father        gallbladder ca into liver   . Arthritis Sister   . Diabetes Sister   . Hyperlipidemia Daughter   . Hypertension Daughter   . Cancer Maternal Aunt        ovarian ca  . Diabetes Paternal Aunt   . Drug abuse Paternal Aunt   . Cancer Maternal Grandfather  colon ca    Social History: Social History   Social History  . Marital status: Married    Spouse name: N/A  . Number of children: N/A  . Years of education: N/A   Occupational History  . Not on file.   Social History Main Topics  . Smoking status: Never Smoker  . Smokeless tobacco: Never Used  . Alcohol use No  . Drug use: No  . Sexual activity: Yes     Comment: not often   Other Topics Concern  . Not on file   Social History Narrative  . No narrative on file    Allergies: Allergies  Allergen Reactions  . Clindamycin/Lincomycin Rash  . Codeine Rash  . Morphine And Related Nausea Only  . Talwin [Pentazocine] Nausea And Vomiting  . Lisinopril Cough    cough  . Oxycodone-Acetaminophen Other (See Comments)    Pt states this medication makes her bowels stop moving    Current Medications: Current Outpatient Prescriptions  Medication Sig Dispense Refill  . ACCU-CHEK AVIVA PLUS  test strip Test once daily 100 each 2  . acetaminophen (TYLENOL) 500 MG tablet Take 2 tablets (1,000 mg total) by mouth every 6 (six) hours. 65 tablet 1  . albuterol (PROVENTIL HFA;VENTOLIN HFA) 108 (90 BASE) MCG/ACT inhaler Inhale 2 puffs into the lungs every 6 (six) hours as needed for wheezing or shortness of breath.     . Ascorbic Acid (VITAMIN C) 1000 MG tablet Take 1,000 mg by mouth every evening.     Marland Kitchen aspirin EC 81 MG tablet Take 81 mg by mouth every evening.     Marland Kitchen atorvastatin (LIPITOR) 20 MG tablet Take 20 mg by mouth at bedtime.     Marland Kitchen azelastine (ASTELIN) 0.1 % nasal spray Place 1 spray into both nostrils daily.    . Biotin 1000 MCG tablet Take 1,000 mcg by mouth 2 (two) times daily.    . Cholecalciferol (D3-1000 PO) Take 1 tablet by mouth daily.     Marland Kitchen CINNAMON PO Take 1,000 mg by mouth 2 (two) times daily.    Marland Kitchen docusate sodium (COLACE) 100 MG capsule Take 1 capsule (100 mg total) by mouth 2 (two) times daily. 20 capsule 0  . fish oil-omega-3 fatty acids 1000 MG capsule Take 1 g by mouth every evening.     . fluticasone (FLONASE) 50 MCG/ACT nasal spray Place 1 spray into both nostrils daily. 48 g 1  . furosemide (LASIX) 20 MG tablet Take 1 tablet by mouth  every other day (Patient taking differently: Take 1 tablet by mouth every other day) 45 tablet 2  . glipiZIDE (GLUCOTROL XL) 5 MG 24 hr tablet Take 1 tablet (5 mg total) by mouth daily. (Patient taking differently: Take 5 mg by mouth every evening. ) 30 tablet 3  . insulin detemir (LEVEMIR) 100 UNIT/ML injection Inject 0.15 mLs (15 Units total) into the skin at bedtime. (Patient taking differently: Inject 20 Units into the skin at bedtime. ) 10 mL 11  . Lancet Devices (ACCU-CHEK SOFTCLIX) lancets Use to test blood sugars 1 -2 times daily 1 each 3  . loratadine (CLARITIN) 10 MG tablet Take 10 mg by mouth daily.    Marland Kitchen losartan-hydrochlorothiazide (HYZAAR) 50-12.5 MG tablet TAKE 1 TABLET BY MOUTH  DAILY 90 tablet 1  . metFORMIN  (GLUCOPHAGE-XR) 750 MG 24 hr tablet TAKE 2 TABLETS BY MOUTH  DAILY WITH BREAKFAST 180 tablet 1  . Multiple Vitamin (MULTIVITAMIN) tablet Take 1 tablet by mouth  daily.    . omeprazole (PRILOSEC) 20 MG capsule TAKE 1 CAPSULE BY MOUTH  DAILY 90 capsule 1  . oxyCODONE (ROXICODONE) 5 MG immediate release tablet Take 1 tablet (5 mg total) by mouth every 4 (four) hours as needed. 16 tablet 0  . Potassium 99 MG TABS Take 99 mg by mouth daily.     . Probiotic Product (PROBIOTIC-10) CAPS Take 1 capsule by mouth daily.     Marland Kitchen senna-docusate (SENOKOT-S) 8.6-50 MG per tablet Take 1 tablet by mouth daily. 30 tablet 0  . sertraline (ZOLOFT) 50 MG tablet TAKE 1 TABLET BY MOUTH  DAILY 90 tablet 1  . sitaGLIPtin (JANUVIA) 100 MG tablet Take 1 tablet (100 mg total) by mouth daily. 90 tablet 3  . verapamil (VERELAN PM) 180 MG 24 hr capsule TAKE 1 CAPSULE BY MOUTH  DAILY 90 capsule 1   No current facility-administered medications for this visit.     Review of Systems General: positive for weight gain, negative for, fevers Skin: negative for changes in color, texture, moles or lesions Eyes: negative for, changes in vision HEENT: positive for hearing loss, tinnitus and voice change, negative for visual trouble. She believes the hearing loss and tinnitus is due to diabetes. The voice change she attributes to GERD.  Pulmonary: negative for, dyspnea, productive cough Cardiac: negative for, palpitations, pain Gastrointestinal: negative for, nausea, vomiting, constipation, diarrhea, hematemesis, hematochezia Genitourinary/Sexual: positive for hematuria, urinary incontinence and urinary retention. She wears a pad daily and incontinence occurs with stress or anytime. She had SVD x 2 and babies weighed ~ 8 pounds.  Ob/Gyn: irregular bleeding Musculoskeletal: negative for, pain Hematology: negative for, easy bruising Neurologic/Psych: negative for, headaches, seizures, paralysis, weakness  Objective:  Physical  Examination:  BP 119/74   Pulse 71   Temp 98.2 F (36.8 C) (Oral)   Resp 18   Ht 5\' 1"  (1.549 m)   Wt 178 lb 3.2 oz (80.8 kg)   BMI 33.67 kg/m    ECOG Performance Status: 0 - Asymptomatic  General appearance: alert, cooperative and appears stated age HEENT:PERRLA, extra ocular movement intact and sclera clear, anicteric Lymph node survey: non-palpable, axillary, inguinal, supraclavicular Cardiovascular: regular rate and rhythm Respiratory: normal air entry, lungs clear to auscultation Abdomen: soft, non-tender, without masses or organomegaly, no hernias and well healed incisions in the RUQ and the umbilicus Back: inspection of back is normal Extremities: extremities normal, atraumatic, no cyanosis or edema Skin exam - normal coloration and turgor, no rashes, no suspicious skin lesions noted. Neurological exam reveals alert, oriented, normal speech, no focal findings or movement disorder noted.  Pelvic: exam chaperoned by nurse;  Vulva: normal appearing vulva with no masses, tenderness or lesions; Vagina: normal vagina, cuff healing well.  Bimanual: no masses.    Assessment:  Sandra Hendrix is a 71 y.o. female diagnosed with grade 1 endometrioid endometrial cancer. Patient delayed surgery and has stage IB grade 1 disease invading the full thickness of the myometrium and positive pelvic peritoneal cytology.  Bilateral sentinel nodes, cervix and adnexa negative.    Normal post op exam.    Medical co-morbidities complicating care: OSA on CPAP, HTN, diabetes, CKD and obesity (with Body mass index is 33.67 kg/m.)  Plan:   Problem List Items Addressed This Visit      Genitourinary   Endometrioid adenocarcinoma of uterus ([place]) - Primary     In view of deep invasion and positive washings with grade 1 cancer, she has about a 20% risk  of recurrence should receive adjuvant therapy.  Since she has grade1 disease my preference would be for radiation + maintenance hormonal therapy with  Letrozole.  Dr Baruch Gouty came to see her today and we discussed the pros and cons of vaginal brachytherapy alone versus external pelvic radiation.  Goal is to decrease local recurrence risk with RT and hope that adjuvant hormonal therapy with Letrozole after that is done will have an impact on distant recurrence risk.  Dr. Baruch Gouty and I favor external radiation in this case in view of full thickness invasion and positive washings.  She will be seen in rad onc clinic next week and we will see her back in 3 months for follow up.  Urinary incontinence- we again discussed options for management including Urogynecology referral. She would like to be seen at [facility] and we will set up an appt with Dr Sharlett Iles in three months after she is done with radiation.    Mellody Drown, MD  CC: Brayton Mars, MD  PCP: Crecencio Mc, MD [address] [place], [place] [address] [phone]

## 2017-03-22 NOTE — Progress Notes (Signed)
Pt post op from procedure, here to discuss the results.

## 2017-03-30 ENCOUNTER — Ambulatory Visit
Admission: RE | Admit: 2017-03-30 | Discharge: 2017-03-30 | Disposition: A | Discharge status: Home / Self Care | Payer: Medicare Other | Source: Ambulatory Visit | Attending: Radiation Oncology | Admitting: Radiation Oncology

## 2017-03-30 ENCOUNTER — Encounter: Payer: Self-pay | Admitting: Radiation Oncology

## 2017-03-30 VITALS — BP 120/66 | HR 69 | Temp 98.0°F | Wt 175.8 lb

## 2017-03-30 DIAGNOSIS — K449 Diaphragmatic hernia without obstruction or gangrene: Secondary | ICD-10-CM | POA: Diagnosis not present

## 2017-03-30 DIAGNOSIS — I129 Hypertensive chronic kidney disease with stage 1 through stage 4 chronic kidney disease, or unspecified chronic kidney disease: Secondary | ICD-10-CM | POA: Diagnosis not present

## 2017-03-30 DIAGNOSIS — G4730 Sleep apnea, unspecified: Secondary | ICD-10-CM | POA: Diagnosis not present

## 2017-03-30 DIAGNOSIS — K219 Gastro-esophageal reflux disease without esophagitis: Secondary | ICD-10-CM | POA: Insufficient documentation

## 2017-03-30 DIAGNOSIS — Z794 Long term (current) use of insulin: Secondary | ICD-10-CM | POA: Insufficient documentation

## 2017-03-30 DIAGNOSIS — F329 Major depressive disorder, single episode, unspecified: Secondary | ICD-10-CM | POA: Diagnosis not present

## 2017-03-30 DIAGNOSIS — F419 Anxiety disorder, unspecified: Secondary | ICD-10-CM | POA: Insufficient documentation

## 2017-03-30 DIAGNOSIS — C55 Malignant neoplasm of uterus, part unspecified: Secondary | ICD-10-CM | POA: Insufficient documentation

## 2017-03-30 DIAGNOSIS — Z8719 Personal history of other diseases of the digestive system: Secondary | ICD-10-CM | POA: Diagnosis not present

## 2017-03-30 DIAGNOSIS — Z510 Encounter for antineoplastic radiation therapy: Secondary | ICD-10-CM | POA: Diagnosis not present

## 2017-03-30 DIAGNOSIS — E119 Type 2 diabetes mellitus without complications: Secondary | ICD-10-CM | POA: Insufficient documentation

## 2017-03-30 DIAGNOSIS — Z79899 Other long term (current) drug therapy: Secondary | ICD-10-CM | POA: Diagnosis not present

## 2017-03-30 DIAGNOSIS — Z7982 Long term (current) use of aspirin: Secondary | ICD-10-CM | POA: Diagnosis not present

## 2017-03-30 DIAGNOSIS — N183 Chronic kidney disease, stage 3 (moderate): Secondary | ICD-10-CM | POA: Insufficient documentation

## 2017-03-30 NOTE — Consult Note (Signed)
NEW PATIENT EVALUATION  Name: Sandra Hendrix  MRN: [id number]  Date:   03/30/2017     DOB: 1946/09/13   This 71 y.o. 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  REFERRING PHYSICIAN: Crecencio Mc, MD  CHIEF COMPLAINT:  Chief Complaint  Patient presents with  . Cancer    Initial Evaluation    DIAGNOSIS: The encounter diagnosis was Endometrioid adenocarcinoma of uterus ([place]).   PREVIOUS INVESTIGATIONS:  Pathology reports reviewed Chest x-ray reviewed pelvic ultrasound reviewed Clinical notes reviewed   HPI: Patient is a pleasant 71 year old female who presented with postmenopausal bleeding. She was seen by her PMD and Pap smear in January 2018 was abnormal showing atypical glandular epithelium. She underwent in January 2018 also a ultrasound showing uterine mass and underwent hysteroscopy with D&C in April 2018 showing grade 1 endometrioid endometrial carcinoma. Her her definitive surgery was delayed secondary to a death in the family although she underwent a T8 LH BSO with sentinel lymph node mapping in June 2018. No sentinel lymph nodes were involved. There was FIGO grade 1 endometrioid carcinoma with squamous differentiation extending into the outer half the myometrium. There was 99% myometrial involvement invasion. Cervical stromal involvement was not identified there was however positive malignancy in the peritoneal ascitic fluid. She has done well postoperatively. She specifically denies diarrhea dysuria or any other GI/GU complaints. I met the patient at Dr. Fransisca Connors last clinical appointment. Patient does have chronic renal disease stage III. She also has type 2 diabetes with renal complications as discussed above.   PLANNED TREATMENT REGIMEN: Whole pelvic radiation  PAST MEDICAL HISTORY:  has a past  medical history of Allergy; Anxiety; Arthritis; Barrett's esophagus determined by endoscopy; Cancer ([place]); Chronic kidney disease; Depression; Diabetes mellitus without complication ([place]); Difficult intubation; Diverticulosis; GERD (gastroesophageal reflux disease); History of bronchitis; History of hiatal hernia; Hyperlipidemia; Hypertension; Sleep apnea; Urinary incontinence; and Varicose veins.    PAST SURGICAL HISTORY:  Past Surgical History:  Procedure Laterality Date  . CHOLECYSTECTOMY    . COLONOSCOPY WITH PROPOFOL N/A 06/14/2016   Procedure: COLONOSCOPY WITH PROPOFOL;  Surgeon: Lollie Sails, MD;  Location: [facility] ENDOSCOPY;  Service: Endoscopy;  Laterality: N/A;  . ESOPHAGOGASTRODUODENOSCOPY (EGD) WITH PROPOFOL N/A 06/14/2016   Procedure: ESOPHAGOGASTRODUODENOSCOPY (EGD) WITH PROPOFOL;  Surgeon: Lollie Sails, MD;  Location: [facility] ENDOSCOPY;  Service: Endoscopy;  Laterality: N/A;  . HYSTEROSCOPY W/D&C N/A 01/02/2017   Procedure: DILATATION AND CURETTAGE /HYSTEROSCOPY;  Surgeon: Brayton Mars, MD;  Location: [facility] ORS;  Service: Gynecology;  Laterality: N/A;  . JOINT REPLACEMENT Right 2007   Total Knee Replacement  . JOINT REPLACEMENT Left 2004   Total Knee Replacement  . LAPAROSCOPIC HYSTERECTOMY Bilateral 03/01/2017   Procedure: HYSTERECTOMY TOTAL LAPAROSCOPIC BSO;  Surgeon: Mellody Drown, MD;  Location: [facility] ORS;  Service: Gynecology;  Laterality: Bilateral;  . ROTATOR CUFF REPAIR Right   . SENTINEL NODE BIOPSY N/A 03/01/2017   Procedure: SENTINEL NODE INJECTION AND BIOPSY;  Surgeon: Mellody Drown, MD;  Location: [facility] ORS;  Service: Gynecology;  Laterality: N/A;  . TONSILLECTOMY     as a child  . TUBAL LIGATION    . UPPER GI ENDOSCOPY     x2    FAMILY HISTORY: family history includes Alcohol abuse in her father; Arthritis in her sister; Cancer in her father, maternal aunt, and maternal  grandfather; Diabetes in her father, paternal aunt, and sister; Drug abuse in her  paternal aunt; Hyperlipidemia in her daughter; Hypertension in her daughter and mother; Lupus in her mother; Stroke in her mother.  SOCIAL HISTORY:  reports that she has never smoked. She has never used smokeless tobacco. She reports that she does not drink alcohol or use drugs.  ALLERGIES: Clindamycin/lincomycin; Codeine; Morphine and related; Talwin [pentazocine]; Lisinopril; and Oxycodone-acetaminophen  MEDICATIONS:  Current Outpatient Prescriptions  Medication Sig Dispense Refill  . ACCU-CHEK AVIVA PLUS test strip Test once daily 100 each 2  . acetaminophen (TYLENOL) 500 MG tablet Take 2 tablets (1,000 mg total) by mouth every 6 (six) hours. 65 tablet 1  . albuterol (PROVENTIL HFA;VENTOLIN HFA) 108 (90 BASE) MCG/ACT inhaler Inhale 2 puffs into the lungs every 6 (six) hours as needed for wheezing or shortness of breath.     . Ascorbic Acid (VITAMIN C) 1000 MG tablet Take 1,000 mg by mouth every evening.     Marland Kitchen aspirin EC 81 MG tablet Take 81 mg by mouth every evening.     Marland Kitchen atorvastatin (LIPITOR) 20 MG tablet Take 20 mg by mouth at bedtime.     Marland Kitchen azelastine (ASTELIN) 0.1 % nasal spray Place 1 spray into both nostrils daily.    . Biotin 1000 MCG tablet Take 1,000 mcg by mouth 2 (two) times daily.    . Cholecalciferol (D3-1000 PO) Take 1 tablet by mouth daily.     Marland Kitchen CINNAMON PO Take 1,000 mg by mouth 2 (two) times daily.    Marland Kitchen docusate sodium (COLACE) 100 MG capsule Take 1 capsule (100 mg total) by mouth 2 (two) times daily. 20 capsule 0  . fish oil-omega-3 fatty acids 1000 MG capsule Take 1 g by mouth every evening.     . fluticasone (FLONASE) 50 MCG/ACT nasal spray Place 1 spray into both nostrils daily. 48 g 1  . furosemide (LASIX) 20 MG tablet Take 1 tablet by mouth  every other day (Patient taking differently: Take 1 tablet by mouth every other day) 45 tablet 2  . glipiZIDE (GLUCOTROL XL) 5 MG 24 hr tablet Take 1 tablet (5 mg total) by mouth daily. (Patient taking differently: Take 5 mg by  mouth every evening. ) 30 tablet 3  . insulin detemir (LEVEMIR) 100 UNIT/ML injection Inject 0.15 mLs (15 Units total) into the skin at bedtime. (Patient taking differently: Inject 20 Units into the skin at bedtime. ) 10 mL 11  . Lancet Devices (ACCU-CHEK SOFTCLIX) lancets Use to test blood sugars 1 -2 times daily 1 each 3  . loratadine (CLARITIN) 10 MG tablet Take 10 mg by mouth daily.    Marland Kitchen losartan-hydrochlorothiazide (HYZAAR) 50-12.5 MG tablet TAKE 1 TABLET BY MOUTH  DAILY 90 tablet 1  . meclizine (ANTIVERT) 25 MG tablet Take 25 mg by mouth 2 (two) times daily as needed for dizziness.    . metFORMIN (GLUCOPHAGE-XR) 750 MG 24 hr tablet TAKE 2 TABLETS BY MOUTH  DAILY WITH BREAKFAST 180 tablet 1  . Multiple Vitamin (MULTIVITAMIN) tablet Take 1 tablet by mouth daily.    Marland Kitchen omeprazole (PRILOSEC) 20 MG capsule TAKE 1 CAPSULE BY MOUTH  DAILY 90 capsule 1  . oxyCODONE (ROXICODONE) 5 MG immediate release tablet Take 1 tablet (5 mg total) by mouth every 4 (four) hours as needed. (Patient not taking: Reported on 03/22/2017) 16 tablet 0  . Potassium 99 MG TABS Take 99 mg by mouth daily.     . Probiotic  Product (PROBIOTIC-10) CAPS Take 1 capsule by mouth daily.     . sertraline (ZOLOFT) 50 MG tablet TAKE 1 TABLET BY MOUTH  DAILY 90 tablet 1  . sitaGLIPtin (JANUVIA) 100 MG tablet Take 1 tablet (100 mg total) by mouth daily. 90 tablet 3  . verapamil (VERELAN PM) 180 MG 24 hr capsule TAKE 1 CAPSULE BY MOUTH  DAILY 90 capsule 1   No current facility-administered medications for this encounter.     ECOG PERFORMANCE STATUS:  0 - Asymptomatic  REVIEW OF SYSTEMS:  Patient does have urinary incontinence which is being investigated. Patient denies any weight loss, fatigue, weakness, fever, chills or night sweats. Patient denies any loss of vision, blurred vision. Patient denies any ringing  of the ears or hearing loss. No irregular heartbeat. Patient denies heart murmur or history of fainting. Patient denies any  chest pain or pain radiating to her upper extremities. Patient denies any shortness of breath, difficulty breathing at night, cough or hemoptysis. Patient denies any swelling in the lower legs. Patient denies any nausea vomiting, vomiting of blood, or coffee ground material in the vomitus. Patient denies any stomach pain. Patient states has had normal bowel movements no significant constipation or diarrhea. Patient denies any dysuria, hematuria or significant nocturia. Patient denies any problems walking, swelling in the joints or loss of balance. Patient denies any skin changes, loss of hair or loss of weight. Patient denies any excessive worrying or anxiety or significant depression. Patient denies any problems with insomnia. Patient denies excessive thirst, polyuria, polydipsia. Patient denies any swollen glands, patient denies easy bruising or easy bleeding. Patient denies any recent infections, allergies or URI. Patient "s visual fields have not changed significantly in recent time.    PHYSICAL EXAM: BP 120/66   Pulse 69   Temp 98 F (36.7 C)   Wt 175 lb 13.1 oz (79.8 kg)   BMI 33.22 kg/m  Well-developed well-nourished patient in NAD. HEENT reveals PERLA, EOMI, discs not visualized.  Oral cavity is clear. No oral mucosal lesions are identified. Neck is clear without evidence of cervical or supraclavicular adenopathy. Lungs are clear to A&P. Cardiac examination is essentially unremarkable with regular rate and rhythm without murmur rub or thrill. Abdomen is benign with no organomegaly or masses noted. Motor sensory and DTR levels are equal and symmetric in the upper and lower extremities. Cranial nerves II through XII are grossly intact. Proprioception is intact. No peripheral adenopathy or edema is identified. No motor or sensory levels are noted. Crude visual fields are within normal range.  LABORATORY DATA: Pathology reports reviewed    RADIOLOGY RESULTS: Chest x-ray and ultrasound  reviewed   IMPRESSION: FIGO grade 1 stage IB endometrioid adenocarcinoma status post hysterectomy sentinel node lymph node mapping with positive peritoneal cytology.  PLAN: I discussed the case with GYN oncology. Based on her positive cytology and significant myometrial invasion would recommend adjuvant radiation therapy. I believe whole pelvic radiation therapy up to 4500 cGy over 5 weeks would be the best adjuvant treatment choice. Patient also will receive antiestrogen therapy. Risks and benefits of treatment including increased lower urinary tract symptoms diarrhea alteration of blood counts skin reaction fatigue all were discussed in detail with the patient. She seems to comprehend my treatment plan well. I have personally set up and ordered CT simulation for next week. She also will pursue follow-up at [facility] for her urinary incontinence.  I would like to take this opportunity to thank you for allowing me to participate  in the care of your patient.Armstead Peaks., MD

## 2017-04-02 ENCOUNTER — Encounter: Payer: Self-pay | Admitting: Internal Medicine

## 2017-04-03 ENCOUNTER — Ambulatory Visit (INDEPENDENT_AMBULATORY_CARE_PROVIDER_SITE_OTHER): Payer: Medicare Other | Admitting: *Deleted

## 2017-04-03 ENCOUNTER — Other Ambulatory Visit: Payer: Self-pay | Admitting: Internal Medicine

## 2017-04-03 ENCOUNTER — Telehealth: Payer: Self-pay | Admitting: *Deleted

## 2017-04-03 VITALS — BP 126/76 | HR 69 | Temp 98.1°F | Resp 18

## 2017-04-03 DIAGNOSIS — R300 Dysuria: Secondary | ICD-10-CM

## 2017-04-03 LAB — POCT URINALYSIS DIPSTICK
Glucose, UA: 100
Ketones, UA: 15
Nitrite, UA: POSITIVE
Protein, UA: 100
Spec Grav, UA: 1.015 (ref 1.010–1.025)
Urobilinogen, UA: 2 E.U./dL — AB
pH, UA: 5 (ref 5.0–8.0)

## 2017-04-03 LAB — URINALYSIS, MICROSCOPIC ONLY

## 2017-04-03 NOTE — Telephone Encounter (Signed)
Burning with urination

## 2017-04-03 NOTE — Telephone Encounter (Signed)
Patient scheduled to come in for nurse visit for UTI symptoms.

## 2017-04-03 NOTE — Addendum Note (Signed)
Addended by: Kerin Salen R on: 04/03/2017 11:03 AM   Modules accepted: Orders

## 2017-04-03 NOTE — Addendum Note (Signed)
Addended by: Arby Barrette on: 04/03/2017 02:11 PM   Modules accepted: Orders

## 2017-04-03 NOTE — Telephone Encounter (Signed)
Pt would like to come for a UTI today after 1 pm Pt contact [phone]

## 2017-04-03 NOTE — Progress Notes (Addendum)
Symptom started of Friday  03/31/17 with burning with urination, with urgency. Denies fever or chills. Patient has been taking OTC Pyridium (AZO) for 2 days.

## 2017-04-04 ENCOUNTER — Encounter: Payer: Self-pay | Admitting: Internal Medicine

## 2017-04-05 ENCOUNTER — Ambulatory Visit
Admission: RE | Admit: 2017-04-05 | Discharge: 2017-04-05 | Disposition: A | Discharge status: Home / Self Care | Payer: Medicare Other | Source: Ambulatory Visit | Attending: Radiation Oncology | Admitting: Radiation Oncology

## 2017-04-05 ENCOUNTER — Ambulatory Visit: Payer: Medicare Other

## 2017-04-05 DIAGNOSIS — C55 Malignant neoplasm of uterus, part unspecified: Secondary | ICD-10-CM | POA: Diagnosis not present

## 2017-04-05 LAB — URINE CULTURE

## 2017-04-06 ENCOUNTER — Other Ambulatory Visit: Payer: Self-pay

## 2017-04-06 ENCOUNTER — Encounter: Payer: Self-pay | Admitting: Internal Medicine

## 2017-04-06 ENCOUNTER — Other Ambulatory Visit: Payer: Self-pay | Admitting: Internal Medicine

## 2017-04-06 DIAGNOSIS — C55 Malignant neoplasm of uterus, part unspecified: Secondary | ICD-10-CM

## 2017-04-06 DIAGNOSIS — R32 Unspecified urinary incontinence: Secondary | ICD-10-CM

## 2017-04-06 MED ORDER — CIPROFLOXACIN HCL 250 MG PO TABS: 250.0000 mg | ORAL_TABLET | Freq: Two times a day (BID) | ORAL | 0 refills | Status: DC

## 2017-04-06 NOTE — Progress Notes (Signed)
iprof

## 2017-04-06 NOTE — Progress Notes (Signed)
Referral for Dr. Cheryl Flash entered faxed with confirmation.

## 2017-04-07 ENCOUNTER — Other Ambulatory Visit: Payer: Self-pay | Admitting: *Deleted

## 2017-04-07 DIAGNOSIS — C55 Malignant neoplasm of uterus, part unspecified: Secondary | ICD-10-CM

## 2017-04-10 ENCOUNTER — Encounter: Payer: Self-pay | Admitting: Internal Medicine

## 2017-04-11 LAB — SURGICAL PATHOLOGY

## 2017-04-12 ENCOUNTER — Ambulatory Visit: Admission: RE | Admit: 2017-04-12 | Payer: Medicare Other | Source: Ambulatory Visit

## 2017-04-13 ENCOUNTER — Emergency Department: Payer: Medicare Other

## 2017-04-13 ENCOUNTER — Ambulatory Visit: Payer: Medicare Other

## 2017-04-13 ENCOUNTER — Ambulatory Visit
Admission: RE | Admit: 2017-04-13 | Discharge: 2017-04-13 | Disposition: A | Discharge status: Home / Self Care | Payer: Medicare Other | Source: Ambulatory Visit | Attending: Radiation Oncology | Admitting: Radiation Oncology

## 2017-04-13 ENCOUNTER — Emergency Department
Admission: EM | Admit: 2017-04-13 | Discharge: 2017-04-13 | Disposition: A | Discharge status: Home / Self Care | Payer: Medicare Other | Attending: Student in an Organized Health Care Education/Training Program | Admitting: Student in an Organized Health Care Education/Training Program

## 2017-04-13 DIAGNOSIS — I10 Essential (primary) hypertension: Secondary | ICD-10-CM | POA: Diagnosis not present

## 2017-04-13 DIAGNOSIS — Z7982 Long term (current) use of aspirin: Secondary | ICD-10-CM | POA: Diagnosis not present

## 2017-04-13 DIAGNOSIS — I82409 Acute embolism and thrombosis of unspecified deep veins of unspecified lower extremity: Secondary | ICD-10-CM

## 2017-04-13 DIAGNOSIS — C55 Malignant neoplasm of uterus, part unspecified: Secondary | ICD-10-CM | POA: Diagnosis not present

## 2017-04-13 DIAGNOSIS — M79605 Pain in left leg: Secondary | ICD-10-CM | POA: Insufficient documentation

## 2017-04-13 DIAGNOSIS — Z794 Long term (current) use of insulin: Secondary | ICD-10-CM | POA: Diagnosis not present

## 2017-04-13 DIAGNOSIS — E119 Type 2 diabetes mellitus without complications: Secondary | ICD-10-CM | POA: Insufficient documentation

## 2017-04-13 DIAGNOSIS — Z79899 Other long term (current) drug therapy: Secondary | ICD-10-CM | POA: Diagnosis not present

## 2017-04-13 MED ORDER — LIDOCAINE 5 % EX PTCH: 1.0000 | MEDICATED_PATCH | CUTANEOUS | 0 refills | Status: DC

## 2017-04-13 NOTE — ED Provider Notes (Signed)
[facility] Emergency Department Provider Note    First MD Initiated Contact with Patient 04/13/17 1627     (approximate)  I have reviewed the triage vital signs and the nursing notes.   HISTORY  Chief Complaint Leg Pain    [name] is a 71 y.o. female presents with left leg pain. Describes as an intermittent burning sensation to the anterior thigh. No fevers. Has felt like it has had some swelling. No bruising. No difficulty walking. Does have a history of uterine cancer is not on any blood thinners. No recent falls. No difficulty with moving her bowels or controlling her bladder.   Past Medical History:  Diagnosis Date  . Allergy   . Anxiety   . Arthritis   . Barrett's esophagus determined by endoscopy   . Cancer [facility])    Uterine Cancer  . Chronic kidney disease   . Depression   . Diabetes mellitus without complication ([place])   . Difficult intubation    "not sure what happened,asleep was told had problem with intubation"  . Diverticulosis   . GERD (gastroesophageal reflux disease)   . History of bronchitis   . History of hiatal hernia   . Hyperlipidemia   . Hypertension   . Sleep apnea    Use C- PAP  . Urinary incontinence   . Varicose veins    Family History  Problem Relation Age of Onset  . Stroke Mother   . Hypertension Mother   . Lupus Mother   . Alcohol abuse Father   . Diabetes Father   . Cancer Father        gallbladder ca into liver   . Arthritis Sister   . Diabetes Sister   . Hyperlipidemia Daughter   . Hypertension Daughter   . Cancer Maternal Aunt        ovarian ca  . Diabetes Paternal Aunt   . Drug abuse Paternal Aunt   . Cancer Maternal Grandfather        colon ca   Past Surgical History:  Procedure Laterality Date  . CHOLECYSTECTOMY    . COLONOSCOPY WITH PROPOFOL N/A 06/14/2016   Procedure: COLONOSCOPY WITH PROPOFOL;  Surgeon: Lollie Sails, MD;  Location: [facility] ENDOSCOPY;  Service: Endoscopy;   Laterality: N/A;  . ESOPHAGOGASTRODUODENOSCOPY (EGD) WITH PROPOFOL N/A 06/14/2016   Procedure: ESOPHAGOGASTRODUODENOSCOPY (EGD) WITH PROPOFOL;  Surgeon: Lollie Sails, MD;  Location: [facility] ENDOSCOPY;  Service: Endoscopy;  Laterality: N/A;  . HYSTEROSCOPY W/D&C N/A 01/02/2017   Procedure: DILATATION AND CURETTAGE /HYSTEROSCOPY;  Surgeon: Brayton Mars, MD;  Location: [facility] ORS;  Service: Gynecology;  Laterality: N/A;  . JOINT REPLACEMENT Right 2007   Total Knee Replacement  . JOINT REPLACEMENT Left 2004   Total Knee Replacement  . LAPAROSCOPIC HYSTERECTOMY Bilateral 03/01/2017   Procedure: HYSTERECTOMY TOTAL LAPAROSCOPIC BSO;  Surgeon: Mellody Drown, MD;  Location: [facility] ORS;  Service: Gynecology;  Laterality: Bilateral;  . ROTATOR CUFF REPAIR Right   . SENTINEL NODE BIOPSY N/A 03/01/2017   Procedure: SENTINEL NODE INJECTION AND BIOPSY;  Surgeon: Mellody Drown, MD;  Location: [facility] ORS;  Service: Gynecology;  Laterality: N/A;  . TONSILLECTOMY     as a child  . TUBAL LIGATION    . UPPER GI ENDOSCOPY     x2   Patient Active Problem List   Diagnosis Date Noted  . Endometrioid adenocarcinoma of uterus ([place]) 01/19/2017  . Status post D&C 01/02/2017  . OSA on CPAP 11/24/2016  .  Cyst of ovary 11/16/2016  . Uterine leiomyoma 11/16/2016  . Cervical stenosis (uterine cervix) 11/16/2016  . Snoring 07/31/2016  . Cyst, vulva 10/11/2015  . CKD (chronic kidney disease) stage 3, GFR 30-59 ml/min 11/07/2014  . Acid reflux 12/26/2013  . Irritable bowel syndrome with constipation 12/26/2013  . Hyperlipidemia LDL goal <100 07/15/2013  . Obesity 07/15/2013  . Chronic venous insufficiency 03/21/2013  . DM (diabetes mellitus), type 2 with renal complications ([place]) 00/86/7619  . Lack of libido 11/05/2012  . Barrett's esophagus 11/05/2012  . Hiatal hernia 11/05/2012  . Essential hypertension 11/05/2012  . Urinary incontinence       Prior to Admission medications   Medication Sig Start  Date End Date Taking? Authorizing Provider  ACCU-CHEK AVIVA PLUS test strip Test once daily 04/26/16   Crecencio Mc, MD  acetaminophen (TYLENOL) 500 MG tablet Take 2 tablets (1,000 mg total) by mouth every 6 (six) hours. 03/01/17   Ward, Honor Loh, MD  albuterol (PROVENTIL HFA;VENTOLIN HFA) 108 (90 BASE) MCG/ACT inhaler Inhale 2 puffs into the lungs every 6 (six) hours as needed for wheezing or shortness of breath.     [provider]  Ascorbic Acid (VITAMIN C) 1000 MG tablet Take 1,000 mg by mouth every evening.     [provider]  aspirin EC 81 MG tablet Take 81 mg by mouth every evening.     [provider]  atorvastatin (LIPITOR) 20 MG tablet Take 20 mg by mouth at bedtime.     [provider]  azelastine (ASTELIN) 0.1 % nasal spray Place 1 spray into both nostrils daily.    [provider]  Biotin 1000 MCG tablet Take 1,000 mcg by mouth 2 (two) times daily.    [provider]  Cholecalciferol (D3-1000 PO) Take 1 tablet by mouth daily.     [provider]  CINNAMON PO Take 1,000 mg by mouth 2 (two) times daily.    [provider]  ciprofloxacin (CIPRO) 250 MG tablet Take 1 tablet (250 mg total) by mouth 2 (two) times daily. 04/06/17   Crecencio Mc, MD  docusate sodium (COLACE) 100 MG capsule Take 1 capsule (100 mg total) by mouth 2 (two) times daily. 03/01/17   Ward, Honor Loh, MD  fish oil-omega-3 fatty acids 1000 MG capsule Take 1 g by mouth every evening.     [provider]  fluticasone (FLONASE) 50 MCG/ACT nasal spray Place 1 spray into both nostrils daily. 03/07/16   Crecencio Mc, MD  furosemide (LASIX) 20 MG tablet Take 1 tablet by mouth  every other day Patient taking differently: Take 1 tablet by mouth every other day 05/13/16   Crecencio Mc, MD  glipiZIDE (GLUCOTROL XL) 5 MG 24 hr tablet Take 1 tablet (5 mg total) by mouth daily. Patient taking differently: Take 5 mg by mouth every evening.  02/24/17    Crecencio Mc, MD  insulin detemir (LEVEMIR) 100 UNIT/ML injection Inject 0.15 mLs (15 Units total) into the skin at bedtime. Patient taking differently: Inject 20 Units into the skin at bedtime.  01/27/17   Crecencio Mc, MD  Lancet Devices [facility]) lancets Use to test blood sugars 1 -2 times daily 06/24/14   Crecencio Mc, MD  loratadine (CLARITIN) 10 MG tablet Take 10 mg by mouth daily.    [provider]  losartan-hydrochlorothiazide (HYZAAR) 50-12.5 MG tablet TAKE 1 TABLET BY MOUTH  DAILY 03/03/17   Crecencio Mc, MD  meclizine (ANTIVERT) 25 MG tablet Take 25 mg by mouth 2 (two) times daily as needed for dizziness.    [provider]  metFORMIN (GLUCOPHAGE-XR) 750 MG 24 hr tablet TAKE 2 TABLETS BY MOUTH  DAILY WITH BREAKFAST 03/02/17   Crecencio Mc, MD  Multiple Vitamin (MULTIVITAMIN) tablet Take 1 tablet by mouth daily.    [provider]  omeprazole (PRILOSEC) 20 MG capsule TAKE 1 CAPSULE BY MOUTH  DAILY 03/02/17   Crecencio Mc, MD  oxyCODONE (ROXICODONE) 5 MG immediate release tablet Take 1 tablet (5 mg total) by mouth every 4 (four) hours as needed. Patient not taking: Reported on 03/22/2017 03/01/17   Ward, Honor Loh, MD  Potassium 99 MG TABS Take 99 mg by mouth daily.     [provider]  Probiotic Product (PROBIOTIC-10) CAPS Take 1 capsule by mouth daily.     [provider]  sertraline (ZOLOFT) 50 MG tablet TAKE 1 TABLET BY MOUTH  DAILY 03/02/17   Crecencio Mc, MD  sitaGLIPtin (JANUVIA) 100 MG tablet Take 1 tablet (100 mg total) by mouth daily. 02/24/17   Crecencio Mc, MD  verapamil (VERELAN PM) 180 MG 24 hr capsule TAKE 1 CAPSULE BY MOUTH  DAILY 03/02/17   Crecencio Mc, MD    Allergies Clindamycin/lincomycin; Codeine; Morphine and related; Talwin [pentazocine]; Lisinopril; and Oxycodone-acetaminophen    Social History Social History  Substance Use Topics  . Smoking status: Never Smoker  . Smokeless tobacco: Never  Used  . Alcohol use No    Review of Systems Patient denies headaches, rhinorrhea, blurry vision, numbness, shortness of breath, chest pain, edema, cough, abdominal pain, nausea, vomiting, diarrhea, dysuria, fevers, rashes or hallucinations unless otherwise stated above in HPI. ____________________________________________   PHYSICAL EXAM:  VITAL SIGNS: Vitals:   04/13/17 1542  BP: (!) 128/54  Pulse: 63  Resp: 18  Temp: 98.1 F (36.7 C)    Constitutional: Alert and oriented. Well appearing and in no acute distress. Eyes: Conjunctivae are normal.  Head: Atraumatic. Nose: No congestion/rhinnorhea. Mouth/Throat: Mucous membranes are moist.   Neck: No stridor. Painless ROM.  Cardiovascular: Normal rate, regular rhythm. Grossly normal heart sounds.  Good peripheral circulation. Respiratory: Normal respiratory effort.  No retractions. Lungs CTAB. Gastrointestinal: Soft and nontender. No distention. No abdominal bruits. No CVA tenderness. Musculoskeletal: No lower extremity tenderness nor edema. SILT distally.  2+ pulses bilaterally.  No joint effusions. Neurologic:  Normal speech and language. No gross focal neurologic deficits are appreciated. No facial droop Skin:  Skin is warm, dry and intact. No rash noted. Psychiatric: Mood and affect are normal. Speech and behavior are normal.  ____________________________________________   LABS (all labs ordered are listed, but only abnormal results are displayed)  No results found for this or any previous visit (from the past 24 hour(s)). ____________________________________________ ____________________________________________  [id number]  I personally reviewed all radiographic images ordered to evaluate for the above acute complaints and reviewed radiology reports and findings.  These findings were personally discussed with the patient.  Please see medical record for radiology  report.  ____________________________________________   PROCEDURES  Procedure(s) performed:  Procedures    Critical Care performed: no ____________________________________________   INITIAL IMPRESSION / ASSESSMENT AND PLAN / ED COURSE  Pertinent labs & imaging results that were available during my care of the patient were reviewed by me and considered in my medical decision making (see chart for details).  DDX: dvt, fracture, contusion, spasm, radiculopathy  Sandra Hendrix is  a 71 y.o. who presents to the ED with leg pain as described above. Patient well-appearing and in no acute distress. XR with no evidence of fracture. Patient without any focal neuro deficit to suggest radiculopathy or cord impingement. Ultrasound shows no evidence of DVT. Thigh compartment is soft and benign. She is able to maintain leg elevated straight leg raise.  No evidence of muscle rupture.  Have discussed with the patient and available family all diagnostics and treatments performed thus far and all questions were answered to the best of my ability. The patient demonstrates understanding and agreement with plan.       ____________________________________________   FINAL CLINICAL IMPRESSION(S) / ED DIAGNOSES  Final diagnoses:  Left leg pain      NEW MEDICATIONS STARTED DURING THIS VISIT:  New Prescriptions   No medications on file     Note:  This document was prepared using Dragon voice recognition software and may include unintentional dictation errors.    Merlyn Lot, MD 04/13/17 2228

## 2017-04-13 NOTE — ED Triage Notes (Signed)
Pt reports to ED w/ c/o upper L leg pain that started last night. Pt A/OX4, resp even and unlabored. Pt reports incr pain w/ ambulation. No swelling noted by this RN, L leg no warmer than R leg. Pt reports hysterotomy on 6/26, sts that she was not placed on blood thinners but comtimues to take 81 mg ASA in AM. NAD

## 2017-04-13 NOTE — ED Notes (Signed)

## 2017-04-13 NOTE — Discharge Instructions (Signed)
Please follow up with your PCP.  There is no evidence of fracture of blood clot at this time.  However, if symptoms worsen please return to the ER for a second evaluation.  You may also follow up with your PCP.

## 2017-04-14 ENCOUNTER — Ambulatory Visit: Payer: Medicare Other

## 2017-04-15 ENCOUNTER — Encounter: Payer: Self-pay | Admitting: Internal Medicine

## 2017-04-17 ENCOUNTER — Other Ambulatory Visit: Payer: Self-pay

## 2017-04-17 ENCOUNTER — Ambulatory Visit
Admission: RE | Admit: 2017-04-17 | Discharge: 2017-04-17 | Disposition: A | Discharge status: Home / Self Care | Payer: Medicare Other | Source: Ambulatory Visit | Attending: Radiation Oncology | Admitting: Radiation Oncology

## 2017-04-17 DIAGNOSIS — C55 Malignant neoplasm of uterus, part unspecified: Secondary | ICD-10-CM | POA: Diagnosis not present

## 2017-04-17 MED ORDER — GLIPIZIDE ER 5 MG PO TB24: 5.0000 mg | ORAL_TABLET | Freq: Every day | ORAL | 1 refills | Status: DC

## 2017-04-17 MED ORDER — ACCU-CHEK SOFTCLIX LANCET DEV MISC: 3 refills | Status: DC

## 2017-04-18 ENCOUNTER — Other Ambulatory Visit: Payer: Self-pay

## 2017-04-18 ENCOUNTER — Ambulatory Visit
Admission: RE | Admit: 2017-04-18 | Discharge: 2017-04-18 | Disposition: A | Discharge status: Home / Self Care | Payer: Medicare Other | Source: Ambulatory Visit | Attending: Radiation Oncology | Admitting: Radiation Oncology

## 2017-04-18 DIAGNOSIS — C55 Malignant neoplasm of uterus, part unspecified: Secondary | ICD-10-CM | POA: Diagnosis not present

## 2017-04-18 MED ORDER — GLIPIZIDE ER 5 MG PO TB24: 5.0000 mg | ORAL_TABLET | Freq: Every day | ORAL | 1 refills | Status: DC

## 2017-04-19 ENCOUNTER — Ambulatory Visit
Admission: RE | Admit: 2017-04-19 | Discharge: 2017-04-19 | Disposition: A | Discharge status: Home / Self Care | Payer: Medicare Other | Source: Ambulatory Visit | Attending: Radiation Oncology | Admitting: Radiation Oncology

## 2017-04-19 ENCOUNTER — Other Ambulatory Visit: Payer: Self-pay

## 2017-04-19 DIAGNOSIS — C55 Malignant neoplasm of uterus, part unspecified: Secondary | ICD-10-CM | POA: Diagnosis not present

## 2017-04-19 MED ORDER — ACCU-CHEK SOFTCLIX LANCET DEV MISC: 3 refills | Status: AC

## 2017-04-20 ENCOUNTER — Ambulatory Visit
Admission: RE | Admit: 2017-04-20 | Discharge: 2017-04-20 | Disposition: A | Discharge status: Home / Self Care | Payer: Medicare Other | Source: Ambulatory Visit | Attending: Radiation Oncology | Admitting: Radiation Oncology

## 2017-04-20 DIAGNOSIS — C55 Malignant neoplasm of uterus, part unspecified: Secondary | ICD-10-CM | POA: Diagnosis not present

## 2017-04-21 ENCOUNTER — Ambulatory Visit
Admission: RE | Admit: 2017-04-21 | Discharge: 2017-04-21 | Disposition: A | Discharge status: Home / Self Care | Payer: Medicare Other | Source: Ambulatory Visit | Attending: Radiation Oncology | Admitting: Radiation Oncology

## 2017-04-21 DIAGNOSIS — C55 Malignant neoplasm of uterus, part unspecified: Secondary | ICD-10-CM | POA: Diagnosis not present

## 2017-04-24 ENCOUNTER — Inpatient Hospital Stay: Payer: Medicare Other | Attending: Radiation Oncology

## 2017-04-24 ENCOUNTER — Other Ambulatory Visit: Payer: Self-pay

## 2017-04-24 ENCOUNTER — Ambulatory Visit
Admission: RE | Admit: 2017-04-24 | Discharge: 2017-04-24 | Disposition: A | Discharge status: Home / Self Care | Payer: Medicare Other | Source: Ambulatory Visit | Attending: Radiation Oncology | Admitting: Radiation Oncology

## 2017-04-24 DIAGNOSIS — N882 Stricture and stenosis of cervix uteri: Secondary | ICD-10-CM | POA: Diagnosis not present

## 2017-04-24 DIAGNOSIS — I129 Hypertensive chronic kidney disease with stage 1 through stage 4 chronic kidney disease, or unspecified chronic kidney disease: Secondary | ICD-10-CM | POA: Insufficient documentation

## 2017-04-24 DIAGNOSIS — C55 Malignant neoplasm of uterus, part unspecified: Secondary | ICD-10-CM

## 2017-04-24 DIAGNOSIS — Z7982 Long term (current) use of aspirin: Secondary | ICD-10-CM | POA: Diagnosis not present

## 2017-04-24 DIAGNOSIS — E669 Obesity, unspecified: Secondary | ICD-10-CM | POA: Insufficient documentation

## 2017-04-24 DIAGNOSIS — R32 Unspecified urinary incontinence: Secondary | ICD-10-CM | POA: Diagnosis not present

## 2017-04-24 DIAGNOSIS — Z7984 Long term (current) use of oral hypoglycemic drugs: Secondary | ICD-10-CM | POA: Diagnosis not present

## 2017-04-24 DIAGNOSIS — E1122 Type 2 diabetes mellitus with diabetic chronic kidney disease: Secondary | ICD-10-CM | POA: Diagnosis not present

## 2017-04-24 DIAGNOSIS — G4733 Obstructive sleep apnea (adult) (pediatric): Secondary | ICD-10-CM | POA: Insufficient documentation

## 2017-04-24 DIAGNOSIS — Z79899 Other long term (current) drug therapy: Secondary | ICD-10-CM | POA: Insufficient documentation

## 2017-04-24 DIAGNOSIS — N183 Chronic kidney disease, stage 3 (moderate): Secondary | ICD-10-CM | POA: Diagnosis not present

## 2017-04-24 DIAGNOSIS — C541 Malignant neoplasm of endometrium: Secondary | ICD-10-CM | POA: Insufficient documentation

## 2017-04-24 DIAGNOSIS — K2270 Barrett's esophagus without dysplasia: Secondary | ICD-10-CM | POA: Diagnosis not present

## 2017-04-24 DIAGNOSIS — E785 Hyperlipidemia, unspecified: Secondary | ICD-10-CM | POA: Insufficient documentation

## 2017-04-24 DIAGNOSIS — K449 Diaphragmatic hernia without obstruction or gangrene: Secondary | ICD-10-CM | POA: Insufficient documentation

## 2017-04-24 DIAGNOSIS — I872 Venous insufficiency (chronic) (peripheral): Secondary | ICD-10-CM | POA: Diagnosis not present

## 2017-04-24 DIAGNOSIS — K581 Irritable bowel syndrome with constipation: Secondary | ICD-10-CM | POA: Insufficient documentation

## 2017-04-24 DIAGNOSIS — Z6834 Body mass index (BMI) 34.0-34.9, adult: Secondary | ICD-10-CM | POA: Diagnosis not present

## 2017-04-24 LAB — CBC
HCT: 36.9 % (ref 35.0–47.0)
Hemoglobin: 12.5 g/dL (ref 12.0–16.0)
MCH: 30.3 pg (ref 26.0–34.0)
MCHC: 33.9 g/dL (ref 32.0–36.0)
MCV: 89.4 fL (ref 80.0–100.0)
Platelets: 245 10*3/uL (ref 150–440)
RBC: 4.13 MIL/uL (ref 3.80–5.20)
RDW: 14.7 % — ABNORMAL HIGH (ref 11.5–14.5)
WBC: 3.9 10*3/uL (ref 3.6–11.0)

## 2017-04-25 ENCOUNTER — Ambulatory Visit
Admission: RE | Admit: 2017-04-25 | Discharge: 2017-04-25 | Disposition: A | Discharge status: Home / Self Care | Payer: Medicare Other | Source: Ambulatory Visit | Attending: Radiation Oncology | Admitting: Radiation Oncology

## 2017-04-25 DIAGNOSIS — C55 Malignant neoplasm of uterus, part unspecified: Secondary | ICD-10-CM | POA: Diagnosis not present

## 2017-04-26 ENCOUNTER — Ambulatory Visit
Admission: RE | Admit: 2017-04-26 | Discharge: 2017-04-26 | Disposition: A | Discharge status: Home / Self Care | Payer: Medicare Other | Source: Ambulatory Visit | Attending: Radiation Oncology | Admitting: Radiation Oncology

## 2017-04-26 DIAGNOSIS — C55 Malignant neoplasm of uterus, part unspecified: Secondary | ICD-10-CM | POA: Diagnosis not present

## 2017-04-27 ENCOUNTER — Ambulatory Visit
Admission: RE | Admit: 2017-04-27 | Discharge: 2017-04-27 | Disposition: A | Discharge status: Home / Self Care | Payer: Medicare Other | Source: Ambulatory Visit | Attending: Radiation Oncology | Admitting: Radiation Oncology

## 2017-04-27 DIAGNOSIS — C55 Malignant neoplasm of uterus, part unspecified: Secondary | ICD-10-CM | POA: Diagnosis not present

## 2017-04-28 ENCOUNTER — Ambulatory Visit: Payer: Medicare Other

## 2017-05-01 ENCOUNTER — Ambulatory Visit
Admission: RE | Admit: 2017-05-01 | Discharge: 2017-05-01 | Disposition: A | Discharge status: Home / Self Care | Payer: Medicare Other | Source: Ambulatory Visit | Attending: Radiation Oncology | Admitting: Radiation Oncology

## 2017-05-01 ENCOUNTER — Other Ambulatory Visit: Payer: Self-pay

## 2017-05-01 ENCOUNTER — Inpatient Hospital Stay: Payer: Medicare Other | Attending: Radiation Oncology

## 2017-05-01 DIAGNOSIS — C541 Malignant neoplasm of endometrium: Secondary | ICD-10-CM | POA: Diagnosis not present

## 2017-05-01 DIAGNOSIS — C55 Malignant neoplasm of uterus, part unspecified: Secondary | ICD-10-CM | POA: Diagnosis not present

## 2017-05-01 LAB — CBC
HCT: 34.7 % — ABNORMAL LOW (ref 35.0–47.0)
Hemoglobin: 11.9 g/dL — ABNORMAL LOW (ref 12.0–16.0)
MCH: 30.4 pg (ref 26.0–34.0)
MCHC: 34.3 g/dL (ref 32.0–36.0)
MCV: 88.6 fL (ref 80.0–100.0)
Platelets: 177 10*3/uL (ref 150–440)
RBC: 3.92 MIL/uL (ref 3.80–5.20)
RDW: 14.7 % — ABNORMAL HIGH (ref 11.5–14.5)
WBC: 3.2 10*3/uL — ABNORMAL LOW (ref 3.6–11.0)

## 2017-05-02 ENCOUNTER — Ambulatory Visit
Admission: RE | Admit: 2017-05-02 | Discharge: 2017-05-02 | Disposition: A | Discharge status: Home / Self Care | Payer: Medicare Other | Source: Ambulatory Visit | Attending: Radiation Oncology | Admitting: Radiation Oncology

## 2017-05-02 DIAGNOSIS — C55 Malignant neoplasm of uterus, part unspecified: Secondary | ICD-10-CM | POA: Diagnosis not present

## 2017-05-03 ENCOUNTER — Ambulatory Visit
Admission: RE | Admit: 2017-05-03 | Discharge: 2017-05-03 | Disposition: A | Discharge status: Home / Self Care | Payer: Medicare Other | Source: Ambulatory Visit | Attending: Radiation Oncology | Admitting: Radiation Oncology

## 2017-05-03 DIAGNOSIS — C55 Malignant neoplasm of uterus, part unspecified: Secondary | ICD-10-CM | POA: Diagnosis not present

## 2017-05-04 ENCOUNTER — Ambulatory Visit
Admission: RE | Admit: 2017-05-04 | Discharge: 2017-05-04 | Disposition: A | Discharge status: Home / Self Care | Payer: Medicare Other | Source: Ambulatory Visit | Attending: Radiation Oncology | Admitting: Radiation Oncology

## 2017-05-04 DIAGNOSIS — C55 Malignant neoplasm of uterus, part unspecified: Secondary | ICD-10-CM | POA: Diagnosis not present

## 2017-05-05 ENCOUNTER — Ambulatory Visit
Admission: RE | Admit: 2017-05-05 | Discharge: 2017-05-05 | Disposition: A | Discharge status: Home / Self Care | Payer: Medicare Other | Source: Ambulatory Visit | Attending: Radiation Oncology | Admitting: Radiation Oncology

## 2017-05-05 DIAGNOSIS — C55 Malignant neoplasm of uterus, part unspecified: Secondary | ICD-10-CM | POA: Diagnosis not present

## 2017-05-06 ENCOUNTER — Ambulatory Visit: Payer: Medicare Other

## 2017-05-08 ENCOUNTER — Ambulatory Visit
Admission: RE | Admit: 2017-05-08 | Discharge: 2017-05-08 | Disposition: A | Discharge status: Home / Self Care | Payer: Medicare Other | Source: Ambulatory Visit | Attending: Radiation Oncology | Admitting: Radiation Oncology

## 2017-05-08 ENCOUNTER — Inpatient Hospital Stay: Payer: Medicare Other

## 2017-05-08 DIAGNOSIS — C541 Malignant neoplasm of endometrium: Secondary | ICD-10-CM | POA: Diagnosis not present

## 2017-05-08 DIAGNOSIS — C55 Malignant neoplasm of uterus, part unspecified: Secondary | ICD-10-CM

## 2017-05-08 LAB — CBC
HCT: 32.2 % — ABNORMAL LOW (ref 35.0–47.0)
Hemoglobin: 11 g/dL — ABNORMAL LOW (ref 12.0–16.0)
MCH: 30.1 pg (ref 26.0–34.0)
MCHC: 34.2 g/dL (ref 32.0–36.0)
MCV: 88 fL (ref 80.0–100.0)
Platelets: 216 10*3/uL (ref 150–440)
RBC: 3.65 MIL/uL — ABNORMAL LOW (ref 3.80–5.20)
RDW: 14.9 % — ABNORMAL HIGH (ref 11.5–14.5)
WBC: 4 10*3/uL (ref 3.6–11.0)

## 2017-05-09 ENCOUNTER — Ambulatory Visit
Admission: RE | Admit: 2017-05-09 | Discharge: 2017-05-09 | Disposition: A | Discharge status: Home / Self Care | Payer: Medicare Other | Source: Ambulatory Visit | Attending: Radiation Oncology | Admitting: Radiation Oncology

## 2017-05-09 DIAGNOSIS — C55 Malignant neoplasm of uterus, part unspecified: Secondary | ICD-10-CM | POA: Diagnosis not present

## 2017-05-10 ENCOUNTER — Ambulatory Visit: Payer: Medicare Other

## 2017-05-10 ENCOUNTER — Other Ambulatory Visit: Payer: Self-pay

## 2017-05-10 ENCOUNTER — Other Ambulatory Visit: Payer: Self-pay | Admitting: Gastroenterology

## 2017-05-10 DIAGNOSIS — R1013 Epigastric pain: Secondary | ICD-10-CM

## 2017-05-10 MED ORDER — LETROZOLE 2.5 MG PO TABS: 2.5000 mg | ORAL_TABLET | Freq: Every day | ORAL | 6 refills | Status: DC

## 2017-05-11 ENCOUNTER — Ambulatory Visit
Admission: RE | Admit: 2017-05-11 | Discharge: 2017-05-11 | Disposition: A | Discharge status: Home / Self Care | Payer: Medicare Other | Source: Ambulatory Visit | Attending: Radiation Oncology | Admitting: Radiation Oncology

## 2017-05-11 DIAGNOSIS — C55 Malignant neoplasm of uterus, part unspecified: Secondary | ICD-10-CM | POA: Diagnosis not present

## 2017-05-12 ENCOUNTER — Ambulatory Visit
Admission: RE | Admit: 2017-05-12 | Discharge: 2017-05-12 | Disposition: A | Discharge status: Home / Self Care | Payer: Medicare Other | Source: Ambulatory Visit | Attending: Radiation Oncology | Admitting: Radiation Oncology

## 2017-05-12 ENCOUNTER — Ambulatory Visit
Admission: RE | Admit: 2017-05-12 | Discharge: 2017-05-12 | Disposition: A | Discharge status: Home / Self Care | Payer: Medicare Other | Source: Ambulatory Visit | Attending: Gastroenterology | Admitting: Gastroenterology

## 2017-05-12 DIAGNOSIS — K449 Diaphragmatic hernia without obstruction or gangrene: Secondary | ICD-10-CM | POA: Insufficient documentation

## 2017-05-12 DIAGNOSIS — C55 Malignant neoplasm of uterus, part unspecified: Secondary | ICD-10-CM | POA: Diagnosis not present

## 2017-05-12 DIAGNOSIS — K2270 Barrett's esophagus without dysplasia: Secondary | ICD-10-CM | POA: Insufficient documentation

## 2017-05-12 DIAGNOSIS — K5710 Diverticulosis of small intestine without perforation or abscess without bleeding: Secondary | ICD-10-CM | POA: Diagnosis not present

## 2017-05-12 DIAGNOSIS — R1013 Epigastric pain: Secondary | ICD-10-CM | POA: Diagnosis not present

## 2017-05-13 ENCOUNTER — Ambulatory Visit: Payer: Medicare Other

## 2017-05-15 ENCOUNTER — Inpatient Hospital Stay: Payer: Medicare Other

## 2017-05-15 ENCOUNTER — Ambulatory Visit
Admission: RE | Admit: 2017-05-15 | Discharge: 2017-05-15 | Disposition: A | Discharge status: Home / Self Care | Payer: Medicare Other | Source: Ambulatory Visit | Attending: Radiation Oncology | Admitting: Radiation Oncology

## 2017-05-15 DIAGNOSIS — C541 Malignant neoplasm of endometrium: Secondary | ICD-10-CM | POA: Diagnosis not present

## 2017-05-15 DIAGNOSIS — C55 Malignant neoplasm of uterus, part unspecified: Secondary | ICD-10-CM | POA: Diagnosis not present

## 2017-05-15 LAB — CBC
HCT: 34.5 % — ABNORMAL LOW (ref 35.0–47.0)
Hemoglobin: 12 g/dL (ref 12.0–16.0)
MCH: 30.5 pg (ref 26.0–34.0)
MCHC: 34.7 g/dL (ref 32.0–36.0)
MCV: 88 fL (ref 80.0–100.0)
Platelets: 316 10*3/uL (ref 150–440)
RBC: 3.92 MIL/uL (ref 3.80–5.20)
RDW: 15.3 % — ABNORMAL HIGH (ref 11.5–14.5)
WBC: 4.2 10*3/uL (ref 3.6–11.0)

## 2017-05-15 NOTE — Progress Notes (Signed)
  Oncology Nurse Navigator Documentation Met with Sandra Hendrix while at her XRT appointment. Instructed on Letrozole as ordered by Dr. Fransisca Connors. Printed education also provided. Navigator Location: [facility] (05/15/17 1400)   )                                                     Time Spent with Patient: 15 (05/15/17 1400)

## 2017-05-16 ENCOUNTER — Ambulatory Visit
Admission: RE | Admit: 2017-05-16 | Discharge: 2017-05-16 | Disposition: A | Discharge status: Home / Self Care | Payer: Medicare Other | Source: Ambulatory Visit | Attending: Radiation Oncology | Admitting: Radiation Oncology

## 2017-05-16 ENCOUNTER — Ambulatory Visit (INDEPENDENT_AMBULATORY_CARE_PROVIDER_SITE_OTHER): Payer: Medicare Other | Admitting: Obstetrics and Gynecology

## 2017-05-16 ENCOUNTER — Encounter: Payer: Self-pay | Admitting: Obstetrics and Gynecology

## 2017-05-16 VITALS — BP 96/58 | HR 68 | Ht 61.0 in | Wt 176.4 lb

## 2017-05-16 DIAGNOSIS — C55 Malignant neoplasm of uterus, part unspecified: Secondary | ICD-10-CM

## 2017-05-16 NOTE — Progress Notes (Signed)
Chief complaint: 1. Endometrioid adenocarcinoma of the uterus, Stage IB 2. Status post laparoscopic hysterectomy, BSO with sentinel lymph node sampling. 3.  Ongoing radiation therapy.  Patient is a 71 year old female status post hysterectomy BSO with since no lymph node sampling, currently proceeding with the final 2 weeks of radiation therapy, presents for follow-up.  Surgery was approximately 10 weeks ago. Patient reports normal bowel function.  She reports t this time.  She is not complaining of any significant vaginal d Anis does note mild fatigue, which is not oppressive.  Past Medical History:  Diagnosis Date  . Allergy   . Anxiety   . Arthritis   . Barrett's esophagus determined by endoscopy   . Cancer [facility])    Uterine Cancer  . Chronic kidney disease   . Depression   . Diabetes mellitus without complication ([place])   . Difficult intubation    "not sure what happened,asleep was told had problem with intubation"  . Diverticulosis   . GERD (gastroesophageal reflux disease)   . History of bronchitis   . History of hiatal hernia   . Hyperlipidemia   . Hypertension   . Sleep apnea    Use C- PAP  . Urinary incontinence   . Varicose veins     Past Surgical History:  Procedure Laterality Date  . CHOLECYSTECTOMY    . COLONOSCOPY WITH PROPOFOL N/A 06/14/2016   Procedure: COLONOSCOPY WITH PROPOFOL;  Surgeon: Lollie Sails, MD;  Location: [facility] ENDOSCOPY;  Service: Endoscopy;  Laterality: N/A;  . ESOPHAGOGASTRODUODENOSCOPY (EGD) WITH PROPOFOL N/A 06/14/2016   Procedure: ESOPHAGOGASTRODUODENOSCOPY (EGD) WITH PROPOFOL;  Surgeon: Lollie Sails, MD;  Location: [facility] ENDOSCOPY;  Service: Endoscopy;  Laterality: N/A;  . HYSTEROSCOPY W/D&C N/A 01/02/2017   Procedure: DILATATION AND CURETTAGE /HYSTEROSCOPY;  Surgeon: Brayton Mars, MD;  Location: [facility] ORS;  Service: Gynecology;  Laterality: N/A;  . JOINT REPLACEMENT Right 2007   Total Knee Replacement  . JOINT REPLACEMENT  Left 2004   Total Knee Replacement  . LAPAROSCOPIC HYSTERECTOMY Bilateral 03/01/2017   Procedure: HYSTERECTOMY TOTAL LAPAROSCOPIC BSO;  Surgeon: Mellody Drown, MD;  Location: [facility] ORS;  Service: Gynecology;  Laterality: Bilateral;  . ROTATOR CUFF REPAIR Right   . SENTINEL NODE BIOPSY N/A 03/01/2017   Procedure: SENTINEL NODE INJECTION AND BIOPSY;  Surgeon: Mellody Drown, MD;  Location: [facility] ORS;  Service: Gynecology;  Laterality: N/A;  . TONSILLECTOMY     as a child  . TUBAL LIGATION    . UPPER GI ENDOSCOPY     x2    Review of Systems  Constitutional: Negative for chills, diaphoresis, fever and weight loss.  HENT: Negative.   Eyes: Negative.   Respiratory: Negative.   Cardiovascular: Negative.   Gastrointestinal: Negative.   Genitourinary: Negative.   Musculoskeletal: Negative.   Skin: Negative.   Neurological: Negative.  Negative for weakness.  Endo/Heme/Allergies: Negative.   Psychiatric/Behavioral: Negative.    OBJECTIVE: BP (!) 96/58   Pulse 68   Ht 5\' 1"  (1.549 m)   Wt 176 lb 6.4 oz (80 kg)   BMI 33.33 kg/m  Pleasant female in no acute distress.  Alert and oriented Back: No CVA tenderness. Abdomen: Soft, nontender, without organomegaly; laparoscopy incisions are well healed and without evidence of hernia. Pelvic exam: External Genitalia-normal BUS-normal. Vagina-slightly atrophic; no lesions; vaginal cuff is intact with minimal scarring present. Cervix-surgically absent Uterus-surgically absent. Bimanual-palpable masses or tenderness. Rectovaginal-normal.  External exam. Extremities: Without clubbing, cyanosis or edema  ASSESSMENT: 1.  Stage IB endometrioid adenocarcinoma.  2.  10 weeks status post total Laparoscopic hysterectomy, BSO with sentinel lymph node mapping. 3.  Currently undergoing radiation therapy, reasonably well tolerated. 4.  NED  PLAN: 1.  Continue with relation therapy as noted. 2.  Follow-up with Dr. Margaret Pyle next month as  scheduled. 3.  We'll await recommendations from Dr. Margaret Pyle regarding surveillance exams-typically they are alternating every 6 months between this office and GYN oncology.  A total of 15 minutes were spent face-to-face with the patient during this encounter and over half of that time dealt with counseling and coordination of care.  Brayton Mars, MD  Note: This dictation was prepared with Dragon dictation along with smaller phrase technology. Any transcriptional errors that result from this process are unintentional.

## 2017-05-16 NOTE — Patient Instructions (Signed)
1. Patient will see Dr. ear check in GYN oncology next month. Routine follow-up will be based on his recommendations for continued surveillance regarding endometrial cancer

## 2017-05-17 ENCOUNTER — Ambulatory Visit
Admission: RE | Admit: 2017-05-17 | Discharge: 2017-05-17 | Disposition: A | Discharge status: Home / Self Care | Payer: Medicare Other | Source: Ambulatory Visit | Attending: Radiation Oncology | Admitting: Radiation Oncology

## 2017-05-17 DIAGNOSIS — C55 Malignant neoplasm of uterus, part unspecified: Secondary | ICD-10-CM | POA: Diagnosis not present

## 2017-05-18 ENCOUNTER — Ambulatory Visit
Admission: RE | Admit: 2017-05-18 | Discharge: 2017-05-18 | Disposition: A | Discharge status: Home / Self Care | Payer: Medicare Other | Source: Ambulatory Visit | Attending: Radiation Oncology | Admitting: Radiation Oncology

## 2017-05-18 DIAGNOSIS — C55 Malignant neoplasm of uterus, part unspecified: Secondary | ICD-10-CM | POA: Diagnosis not present

## 2017-05-19 ENCOUNTER — Ambulatory Visit
Admission: RE | Admit: 2017-05-19 | Discharge: 2017-05-19 | Disposition: A | Discharge status: Home / Self Care | Payer: Medicare Other | Source: Ambulatory Visit | Attending: Radiation Oncology | Admitting: Radiation Oncology

## 2017-05-19 ENCOUNTER — Ambulatory Visit: Payer: Medicare Other

## 2017-05-19 DIAGNOSIS — C55 Malignant neoplasm of uterus, part unspecified: Secondary | ICD-10-CM | POA: Diagnosis not present

## 2017-05-22 ENCOUNTER — Ambulatory Visit
Admission: RE | Admit: 2017-05-22 | Discharge: 2017-05-22 | Disposition: A | Discharge status: Home / Self Care | Payer: Medicare Other | Source: Ambulatory Visit | Attending: Radiation Oncology | Admitting: Radiation Oncology

## 2017-05-22 ENCOUNTER — Ambulatory Visit: Payer: Medicare Other

## 2017-05-22 DIAGNOSIS — C55 Malignant neoplasm of uterus, part unspecified: Secondary | ICD-10-CM | POA: Diagnosis not present

## 2017-05-23 ENCOUNTER — Ambulatory Visit
Admission: RE | Admit: 2017-05-23 | Discharge: 2017-05-23 | Disposition: A | Discharge status: Home / Self Care | Payer: Medicare Other | Source: Ambulatory Visit | Attending: Radiation Oncology | Admitting: Radiation Oncology

## 2017-05-23 DIAGNOSIS — C55 Malignant neoplasm of uterus, part unspecified: Secondary | ICD-10-CM | POA: Diagnosis not present

## 2017-05-24 ENCOUNTER — Encounter: Payer: Self-pay | Admitting: Internal Medicine

## 2017-05-26 ENCOUNTER — Ambulatory Visit (INDEPENDENT_AMBULATORY_CARE_PROVIDER_SITE_OTHER): Payer: Medicare Other | Admitting: Family Medicine

## 2017-05-26 ENCOUNTER — Encounter: Payer: Self-pay | Admitting: Family Medicine

## 2017-05-26 DIAGNOSIS — R300 Dysuria: Secondary | ICD-10-CM | POA: Diagnosis not present

## 2017-05-26 DIAGNOSIS — N390 Urinary tract infection, site not specified: Secondary | ICD-10-CM | POA: Insufficient documentation

## 2017-05-26 DIAGNOSIS — N3001 Acute cystitis with hematuria: Secondary | ICD-10-CM | POA: Diagnosis not present

## 2017-05-26 LAB — POCT URINALYSIS DIPSTICK
Bilirubin, UA: NEGATIVE
Glucose, UA: NEGATIVE
Nitrite, UA: NEGATIVE
Protein, UA: NEGATIVE
Spec Grav, UA: 1.015 (ref 1.010–1.025)
Urobilinogen, UA: 0.2 E.U./dL
pH, UA: 6 (ref 5.0–8.0)

## 2017-05-26 LAB — URINALYSIS, MICROSCOPIC ONLY

## 2017-05-26 MED ORDER — CEPHALEXIN 500 MG PO CAPS: 500.0000 mg | ORAL_CAPSULE | Freq: Two times a day (BID) | ORAL | 0 refills | Status: DC

## 2017-05-26 NOTE — Progress Notes (Signed)
  Tommi Rumps, MD Phone: [phone]  Sandra Hendrix is a 71 y.o. female who presents today for same-day visit.  Dysuria: Patient notes about a week of dysuria and urinary frequency and urgency. She just finished radiation for endometrial cancer and notes I told her she would be at risk for UTIs. She notes no fevers. No lower abdominal discomfort. She has chronic upper abdominal discomfort and is followed by GI. This has not changed. She has no blood in her urine.  PMH: nonsmoker.   ROS see history of present illness  Objective  Physical Exam Vitals:   05/26/17 1124  BP: 110/80  Pulse: 62  Temp: 98.4 F (36.9 C)  SpO2: 97%    BP Readings from Last 3 Encounters:  05/26/17 110/80  05/16/17 (!) 96/58  04/13/17 (!) 142/81   Wt Readings from Last 3 Encounters:  05/26/17 173 lb 9.6 oz (78.7 kg)  05/16/17 176 lb 6.4 oz (80 kg)  04/13/17 176 lb (79.8 kg)    Physical Exam  Constitutional: No distress.  Cardiovascular: Normal rate, regular rhythm and normal heart sounds.   Pulmonary/Chest: Effort normal and breath sounds normal.  Abdominal: Soft. Bowel sounds are normal. She exhibits no distension. There is no rebound and no guarding.  Mild soreness upper abdomen, no lower abdominal tenderness  Neurological: She is alert. Gait normal.  Skin: Skin is warm and dry. She is not diaphoretic.     Assessment/Plan: Please see individual problem list.  UTI (urinary tract infection) Symptoms and UA concerning for UTI. She does have chronic upper abdominal discomfort that has been followed by GI and they're planning on an endoscopy. She notes this has not changed. She has no lower abdominal discomfort. We'll start on Keflex. We'll send urine for culture and microscopy. She is given return precautions.   Orders Placed This Encounter  Procedures  . Urine Culture  . Urine Microscopic Only  . POCT Urinalysis Dipstick    Meds ordered this encounter  Medications  .  cephALEXin (KEFLEX) 500 MG capsule    Sig: Take 1 capsule (500 mg total) by mouth 2 (two) times daily.    Dispense:  14 capsule    Refill:  0   Tommi Rumps, MD [place]

## 2017-05-26 NOTE — Assessment & Plan Note (Signed)
Symptoms and UA concerning for UTI. She does have chronic upper abdominal discomfort that has been followed by GI and they're planning on an endoscopy. She notes this has not changed. She has no lower abdominal discomfort. We'll start on Keflex. We'll send urine for culture and microscopy. She is given return precautions.

## 2017-05-26 NOTE — Patient Instructions (Signed)
Nice to see you. We will start you on Keflex for your UTI. We'll contact you when your urine culture returns. If you develop abdominal pain, fevers, or any new or changing symptoms please be evaluated again.

## 2017-05-28 LAB — URINE CULTURE

## 2017-06-03 ENCOUNTER — Other Ambulatory Visit: Payer: Self-pay | Admitting: Internal Medicine

## 2017-06-09 ENCOUNTER — Other Ambulatory Visit: Payer: Self-pay | Admitting: Internal Medicine

## 2017-06-12 ENCOUNTER — Ambulatory Visit
Admission: RE | Admit: 2017-06-12 | Discharge: 2017-06-12 | Disposition: A | Discharge status: Home / Self Care | Payer: Medicare Other | Source: Ambulatory Visit | Attending: Gastroenterology | Admitting: Gastroenterology

## 2017-06-12 ENCOUNTER — Ambulatory Visit: Payer: Medicare Other | Admitting: Anesthesiology

## 2017-06-12 ENCOUNTER — Encounter
Admission: RE | Disposition: A | Discharge status: Home / Self Care | Payer: Self-pay | Source: Ambulatory Visit | Attending: Gastroenterology

## 2017-06-12 DIAGNOSIS — Z888 Allergy status to other drugs, medicaments and biological substances status: Secondary | ICD-10-CM | POA: Insufficient documentation

## 2017-06-12 DIAGNOSIS — I129 Hypertensive chronic kidney disease with stage 1 through stage 4 chronic kidney disease, or unspecified chronic kidney disease: Secondary | ICD-10-CM | POA: Insufficient documentation

## 2017-06-12 DIAGNOSIS — Z9989 Dependence on other enabling machines and devices: Secondary | ICD-10-CM | POA: Insufficient documentation

## 2017-06-12 DIAGNOSIS — Z79899 Other long term (current) drug therapy: Secondary | ICD-10-CM | POA: Diagnosis not present

## 2017-06-12 DIAGNOSIS — Q398 Other congenital malformations of esophagus: Secondary | ICD-10-CM | POA: Diagnosis not present

## 2017-06-12 DIAGNOSIS — Z8719 Personal history of other diseases of the digestive system: Secondary | ICD-10-CM | POA: Diagnosis not present

## 2017-06-12 DIAGNOSIS — K449 Diaphragmatic hernia without obstruction or gangrene: Secondary | ICD-10-CM | POA: Insufficient documentation

## 2017-06-12 DIAGNOSIS — Z7982 Long term (current) use of aspirin: Secondary | ICD-10-CM | POA: Insufficient documentation

## 2017-06-12 DIAGNOSIS — N189 Chronic kidney disease, unspecified: Secondary | ICD-10-CM | POA: Insufficient documentation

## 2017-06-12 DIAGNOSIS — Z8542 Personal history of malignant neoplasm of other parts of uterus: Secondary | ICD-10-CM | POA: Insufficient documentation

## 2017-06-12 DIAGNOSIS — Z794 Long term (current) use of insulin: Secondary | ICD-10-CM | POA: Insufficient documentation

## 2017-06-12 DIAGNOSIS — F329 Major depressive disorder, single episode, unspecified: Secondary | ICD-10-CM | POA: Insufficient documentation

## 2017-06-12 DIAGNOSIS — R0902 Hypoxemia: Secondary | ICD-10-CM | POA: Insufficient documentation

## 2017-06-12 DIAGNOSIS — Z885 Allergy status to narcotic agent status: Secondary | ICD-10-CM | POA: Insufficient documentation

## 2017-06-12 DIAGNOSIS — G4730 Sleep apnea, unspecified: Secondary | ICD-10-CM | POA: Diagnosis not present

## 2017-06-12 DIAGNOSIS — K219 Gastro-esophageal reflux disease without esophagitis: Secondary | ICD-10-CM | POA: Diagnosis not present

## 2017-06-12 DIAGNOSIS — E669 Obesity, unspecified: Secondary | ICD-10-CM | POA: Diagnosis not present

## 2017-06-12 DIAGNOSIS — K2270 Barrett's esophagus without dysplasia: Secondary | ICD-10-CM | POA: Insufficient documentation

## 2017-06-12 DIAGNOSIS — E1122 Type 2 diabetes mellitus with diabetic chronic kidney disease: Secondary | ICD-10-CM | POA: Diagnosis not present

## 2017-06-12 DIAGNOSIS — K2950 Unspecified chronic gastritis without bleeding: Secondary | ICD-10-CM | POA: Diagnosis not present

## 2017-06-12 DIAGNOSIS — Z6833 Body mass index (BMI) 33.0-33.9, adult: Secondary | ICD-10-CM | POA: Insufficient documentation

## 2017-06-12 DIAGNOSIS — E785 Hyperlipidemia, unspecified: Secondary | ICD-10-CM | POA: Insufficient documentation

## 2017-06-12 DIAGNOSIS — F419 Anxiety disorder, unspecified: Secondary | ICD-10-CM | POA: Insufficient documentation

## 2017-06-12 DIAGNOSIS — M1990 Unspecified osteoarthritis, unspecified site: Secondary | ICD-10-CM | POA: Insufficient documentation

## 2017-06-12 HISTORY — PX: ESOPHAGOGASTRODUODENOSCOPY (EGD) WITH PROPOFOL: SHX5813

## 2017-06-12 LAB — GLUCOSE, CAPILLARY: Glucose-Capillary: 102 mg/dL — ABNORMAL HIGH (ref 65–99)

## 2017-06-12 SURGERY — ESOPHAGOGASTRODUODENOSCOPY (EGD) WITH PROPOFOL
Anesthesia: General

## 2017-06-12 MED ORDER — SODIUM CHLORIDE 0.9 % IV SOLN: INTRAVENOUS | Status: DC

## 2017-06-12 MED ORDER — LIDOCAINE HCL (CARDIAC) 20 MG/ML IV SOLN
INTRAVENOUS | Status: DC | PRN
  Administered 2017-06-12: 3 mL via INTRAVENOUS

## 2017-06-12 MED ORDER — SODIUM CHLORIDE 0.9 % IV SOLN
INTRAVENOUS | Status: DC
  Administered 2017-06-12: 1000 mL via INTRAVENOUS

## 2017-06-12 MED ORDER — FENTANYL CITRATE (PF) 100 MCG/2ML IJ SOLN
INTRAMUSCULAR | Status: AC
  Filled 2017-06-12: qty 2

## 2017-06-12 MED ORDER — BUTAMBEN-TETRACAINE-BENZOCAINE 2-2-14 % EX AERO
INHALATION_SPRAY | CUTANEOUS | Status: AC
  Filled 2017-06-12: qty 5

## 2017-06-12 MED ORDER — FENTANYL CITRATE (PF) 100 MCG/2ML IJ SOLN
INTRAMUSCULAR | Status: DC | PRN
  Administered 2017-06-12 (×2): 50 ug via INTRAVENOUS

## 2017-06-12 MED ORDER — LIDOCAINE HCL (PF) 2 % IJ SOLN
INTRAMUSCULAR | Status: AC
  Filled 2017-06-12: qty 2

## 2017-06-12 MED ORDER — PROPOFOL 500 MG/50ML IV EMUL
INTRAVENOUS | Status: DC | PRN
  Administered 2017-06-12: 120 ug/kg/min via INTRAVENOUS

## 2017-06-12 MED ORDER — PROPOFOL 10 MG/ML IV BOLUS
INTRAVENOUS | Status: DC | PRN
  Administered 2017-06-12: 20 mg via INTRAVENOUS

## 2017-06-12 MED ORDER — PROPOFOL 500 MG/50ML IV EMUL
INTRAVENOUS | Status: AC
  Filled 2017-06-12: qty 50

## 2017-06-12 NOTE — Transfer of Care (Signed)
Immediate Anesthesia Transfer of Care Note  Patient: Sandra Hendrix  Procedure(s) Performed: Procedure(s): ESOPHAGOGASTRODUODENOSCOPY (EGD) WITH PROPOFOL (N/A)  Patient Location: PACU  Anesthesia Type:General  Level of Consciousness: awake and alert   Airway & Oxygen Therapy: Patient Spontanous Breathing and Patient connected to nasal cannula oxygen  Post-op Assessment: Report given to RN and Post -op Vital signs reviewed and stable  Post vital signs: Reviewed  Last Vitals:  Vitals:   06/12/17 0644  BP: 133/70  Pulse: 64  Resp: 16  Temp: (!) 36.2 C  SpO2: 99%    Last Pain:  Vitals:   06/12/17 0644  TempSrc: Tympanic         Complications: respiratory complications

## 2017-06-12 NOTE — Anesthesia Postprocedure Evaluation (Signed)
Anesthesia Post Note  Patient: Sandra Hendrix  Procedure(s) Performed: Procedure(s) (LRB): ESOPHAGOGASTRODUODENOSCOPY (EGD) WITH PROPOFOL (N/A)  Patient location during evaluation: PACU Anesthesia Type: General Level of consciousness: awake Pain management: pain level controlled Vital Signs Assessment: post-procedure vital signs reviewed and stable Respiratory status: spontaneous breathing Cardiovascular status: stable Anesthetic complications: no     Last Vitals:  Vitals:   06/12/17 0839 06/12/17 0849  BP: 115/65 114/65  Pulse: (!) 57 (!) 55  Resp: 15 16  Temp:    SpO2: 96% 95%    Last Pain:  Vitals:   06/12/17 0809  TempSrc: Tympanic                 VAN STAVEREN,Gordan Grell

## 2017-06-12 NOTE — Anesthesia Post-op Follow-up Note (Signed)
Anesthesia QCDR form completed.        

## 2017-06-12 NOTE — H&P (Signed)
Outpatient short stay form Pre-procedure 06/12/2017 7:40 AM Lollie Sails MD  Primary Physician: Dr "Deborra Medina  Reason for visit:  EGD  History of present illness:  Patient is a 71 year old female presenting today as above. She has personal history of Barrett's esophagus. She takes no aspirin or blood thinning agents with the exception of 81 mg aspirin..    Current Facility-Administered Medications:  .  0.9 %  sodium chloride infusion, , Intravenous, Continuous, Lollie Sails, MD, Last Rate: 20 mL/hr at 06/12/17 0704, 1,000 mL at 06/12/17 0704 .  0.9 %  sodium chloride infusion, , Intravenous, Continuous, Lollie Sails, MD  Prescriptions Prior to Admission  Medication Sig Dispense Refill Last Dose  . ACCU-CHEK AVIVA PLUS test strip TEST ONCE DAILY 100 each 1 06/11/2017 at Unknown time  . acetaminophen (TYLENOL) 500 MG tablet Take 2 tablets (1,000 mg total) by mouth every 6 (six) hours. 65 tablet 1 Past Week at Unknown time  . albuterol (PROVENTIL HFA;VENTOLIN HFA) 108 (90 BASE) MCG/ACT inhaler Inhale 2 puffs into the lungs every 6 (six) hours as needed for wheezing or shortness of breath.    Past Week at Unknown time  . Ascorbic Acid (VITAMIN C) 1000 MG tablet Take 1,000 mg by mouth every evening.    Past Week at Unknown time  . aspirin EC 81 MG tablet Take 81 mg by mouth every evening.    Past Week at Unknown time  . atorvastatin (LIPITOR) 20 MG tablet Take 20 mg by mouth at bedtime.    Past Week at Unknown time  . azelastine (ASTELIN) 0.1 % nasal spray Place 1 spray into both nostrils daily.   Past Week at Unknown time  . Biotin 1000 MCG tablet Take 1,000 mcg by mouth 2 (two) times daily.   Past Week at Unknown time  . Black Cohosh 20 MG TABS Take 20 mg by mouth daily.   Past Week at Unknown time  . Calcium Carbonate-Vitamin D 300-100 MG-UNIT CAPS Take 600 mg by mouth 2 (two) times daily.   Past Week at Unknown time  . cephALEXin (KEFLEX) 500 MG capsule Take 1 capsule  (500 mg total) by mouth 2 (two) times daily. 14 capsule 0 Past Week at Unknown time  . Cholecalciferol (D3-1000 PO) Take 1 tablet by mouth daily.    Past Week at Unknown time  . CINNAMON PO Take 1,000 mg by mouth 2 (two) times daily.   Past Week at Unknown time  . docusate sodium (COLACE) 100 MG capsule Take 1 capsule (100 mg total) by mouth 2 (two) times daily. 20 capsule 0 Past Week at Unknown time  . fish oil-omega-3 fatty acids 1000 MG capsule Take 1 g by mouth every evening.    Past Week at Unknown time  . fluticasone (FLONASE) 50 MCG/ACT nasal spray Place 1 spray into both nostrils daily. 48 g 1 Past Week at Unknown time  . furosemide (LASIX) 20 MG tablet TAKE 1 TABLET BY MOUTH  EVERY OTHER DAY 45 tablet 3 Past Week at Unknown time  . glipiZIDE (GLUCOTROL XL) 5 MG 24 hr tablet Take 1 tablet (5 mg total) by mouth daily. 90 tablet 1 06/11/2017 at Unknown time  . insulin detemir (LEVEMIR) 100 UNIT/ML injection Inject 0.15 mLs (15 Units total) into the skin at bedtime. (Patient taking differently: Inject 20 Units into the skin at bedtime. ) 10 mL 11 Past Week at Unknown time  . Lancet Devices (ACCU-CHEK SOFTCLIX) lancets Use to test  blood sugars 1 -2 times daily 1 each 3 Past Week at Unknown time  . letrozole (FEMARA) 2.5 MG tablet Take 1 tablet (2.5 mg total) by mouth daily. 30 tablet 6 Past Week at Unknown time  . lisinopril (PRINIVIL,ZESTRIL) 20 MG tablet Take 20 mg by mouth daily.   Past Week at Unknown time  . loratadine (CLARITIN) 10 MG tablet Take 10 mg by mouth daily.   Past Week at Unknown time  . losartan-hydrochlorothiazide (HYZAAR) 50-12.5 MG tablet TAKE 1 TABLET BY MOUTH  DAILY 90 tablet 1 Past Week at Unknown time  . magnesium oxide (MAG-OX) 400 MG tablet Take 400 mg by mouth daily.   Past Week at Unknown time  . meclizine (ANTIVERT) 25 MG tablet Take 25 mg by mouth 2 (two) times daily as needed for dizziness.   Past Week at Unknown time  . medroxyPROGESTERone (PROVERA) 10 MG tablet  Take by mouth.   Past Week at Unknown time  . metFORMIN (GLUCOPHAGE-XR) 750 MG 24 hr tablet TAKE 2 TABLETS BY MOUTH  DAILY WITH BREAKFAST 180 tablet 1 Past Week at Unknown time  . Multiple Vitamin (MULTIVITAMIN) tablet Take 1 tablet by mouth daily.   Past Week at Unknown time  . omeprazole (PRILOSEC) 20 MG capsule Take 20 mg by mouth daily.   Past Week at Unknown time  . pantoprazole (PROTONIX) 40 MG tablet Take by mouth.   Past Week at Unknown time  . Probiotic Product (PROBIOTIC-10) CAPS Take 1 capsule by mouth daily.    Past Week at Unknown time  . sennosides-docusate sodium (SENOKOT-S) 8.6-50 MG tablet Take 1 tablet by mouth daily.   Past Week at Unknown time  . sertraline (ZOLOFT) 50 MG tablet TAKE 1 TABLET BY MOUTH  DAILY 90 tablet 1 Past Week at Unknown time  . sitaGLIPtin (JANUVIA) 100 MG tablet Take 1 tablet (100 mg total) by mouth daily. 90 tablet 3 Past Week at Unknown time  . verapamil (VERELAN PM) 180 MG 24 hr capsule TAKE 1 CAPSULE BY MOUTH  DAILY 90 capsule 1 Past Week at Unknown time     Allergies  Allergen Reactions  . Clindamycin/Lincomycin Rash  . Codeine Rash  . Morphine And Related Nausea Only  . Talwin [Pentazocine] Nausea And Vomiting  . Lisinopril Cough    cough  . Naloxone Other (See Comments)  . Oxycodone-Acetaminophen Other (See Comments)    Pt states this medication makes her bowels stop moving     Past Medical History:  Diagnosis Date  . Allergy   . Anxiety   . Arthritis   . Barrett's esophagus determined by endoscopy   . Cancer [facility])    Uterine Cancer  . Chronic kidney disease   . Depression   . Diabetes mellitus without complication ([place])   . Difficult intubation    "not sure what happened,asleep was told had problem with intubation"  . Diverticulosis   . GERD (gastroesophageal reflux disease)   . History of bronchitis   . History of hiatal hernia   . Hyperlipidemia   . Hypertension   . Sleep apnea    Use C- PAP  . Urinary incontinence    . Varicose veins     Review of systems:      Physical Exam    Heart and lungs: Regular rate and rhythm without rub or gallop, lungs are bilaterally clear.    HEENT: Normocephalic atraumatic eyes are anicteric    Other:     Pertinant exam for  procedure: Soft nontender nondistended bowel sounds positive normoactive    Planned proceedures: EGD and indicated procedures. I have discussed the risks benefits and complications of procedures to include not limited to bleeding, infection, perforation and the risk of sedation and the patient wishes to proceed.    Lollie Sails, MD Gastroenterology 06/12/2017  7:40 AM

## 2017-06-12 NOTE — Op Note (Signed)
[facility] Gastroenterology Patient Name: Sandra Hendrix Procedure Date: 06/12/2017 7:40 AM MRN: [id number] Account #: [id number] Date of Birth: [birth year] Admit Type: Outpatient Age: 71 Room: [facility] ENDO ROOM 1 Gender: Female Note Status: Finalized Procedure:            Upper GI endoscopy Providers:            Lollie Sails, MD Referring MD:         Deborra Medina, MD (Referring MD) Complications:        No immediate complications. Procedure:            Pre-Anesthesia Assessment:                       - ASA Grade Assessment: III - A patient with severe                        systemic disease.                       After obtaining informed consent, the endoscope was                        passed under direct vision. Throughout the procedure,                        the patient's blood pressure, pulse, and oxygen                        saturations were monitored continuously. The Endoscope                        was introduced through the mouth, with the intention of                        advancing to the duodenum. The scope was advanced to                        the third part of the duodenum before the procedure was                        aborted. Medications were given. The upper GI endoscopy                        was unusually difficult due to the patient's                        respiratory instability (hypoxia). Successful                        completion of the procedure was aided by performing                        chin lift. Findings:      The lower third of the esophagus was moderately tortuous.      A small hiatal hernia was found. The Z-line was a variable distance from       incisors; the hiatal hernia was sliding.      Diffuse and patchy mild inflammation characterized by erythema was found       in the gastric body and in the gastric antrum.  Biopsies were taken with       a cold forceps for histology. Biopsies were taken with a cold forceps       for Helicobacter pylori testing.      The cardia and gastric fundus were normal on retroflexion otherwise.      The examined duodenum was normal.      The patient desaturated very quickly necessitating quick removal of the       scope and position change and ventilatory measures. Procedure aborted at       this point due to concern for recurrence. Unfortunately I was unable to       fully evaluate the GE junction or obtain the barretts biopsies. Impression:           - Tortuous esophagus.                       - Small hiatal hernia.                       - Gastritis. Biopsied.                       - Normal examined duodenum.                       - aborted procedure. Recommendation:       - Continue present medications.                       - Will need to consider repeat scope with general                        anesthesia and intubation for further evaluations. Procedure Code(s):    --- Professional ---                       [phone], 77, Esophagogastroduodenoscopy, flexible,                        transoral; with biopsy, single or multiple Diagnosis Code(s):    --- Professional ---                       Q39.9, Congenital malformation of esophagus, unspecified                       K44.9, Diaphragmatic hernia without obstruction or                        gangrene                       K29.70, Gastritis, unspecified, without bleeding CPT copyright 2016 American Medical Association. All rights reserved. The codes documented in this report are preliminary and upon coder review may  be revised to meet current compliance requirements. Lollie Sails, MD 06/12/2017 8:10:43 AM This report has been signed electronically. Number of Addenda: 0 Note Initiated On: 06/12/2017 7:40 AM      [facility]

## 2017-06-12 NOTE — Transfer of Care (Signed)
Immediate Anesthesia Transfer of Care Note  Patient: Sandra Hendrix  Procedure(s) Performed: Procedure(s): ESOPHAGOGASTRODUODENOSCOPY (EGD) WITH PROPOFOL (N/A)  Patient Location: PACU  Anesthesia Type:General  Level of Consciousness: awake  Airway & Oxygen Therapy: Patient Spontanous Breathing  Post-op Assessment: Report given to RN  Post vital signs: Reviewed  Last Vitals:  Vitals:   06/12/17 0644  BP: 133/70  Pulse: 64  Resp: 16  Temp: (!) 36.2 C  SpO2: 99%    Last Pain:  Vitals:   06/12/17 0644  TempSrc: Tympanic         Complications: No apparent anesthesia complications

## 2017-06-12 NOTE — Anesthesia Preprocedure Evaluation (Signed)
Anesthesia Evaluation  Patient identified by MRN, date of birth, ID band Patient awake    Reviewed: Allergy & Precautions, NPO status , Patient's Chart, lab work & pertinent test results  History of Anesthesia Complications (+) DIFFICULT AIRWAY  Airway Mallampati: III       Dental  (+) Teeth Intact   Pulmonary sleep apnea and Continuous Positive Airway Pressure Ventilation ,    breath sounds clear to auscultation       Cardiovascular Exercise Tolerance: Good hypertension, Pt. on medications  Rhythm:Regular     Neuro/Psych Anxiety Depression    GI/Hepatic Neg liver ROS, hiatal hernia, GERD  Medicated,  Endo/Other  diabetes, Type 1, Insulin Dependent  Renal/GU CRFRenal disease     Musculoskeletal   Abdominal (+) + obese,   Peds negative pediatric ROS (+)  Hematology negative hematology ROS (+)   Anesthesia Other Findings   Reproductive/Obstetrics                             Anesthesia Physical Anesthesia Plan  ASA: III  Anesthesia Plan: General   Post-op Pain Management:    Induction: Intravenous  PONV Risk Score and Plan: 0  Airway Management Planned: Natural Airway and Nasal Cannula  Additional Equipment:   Intra-op Plan:   Post-operative Plan:   Informed Consent: I have reviewed the patients History and Physical, chart, labs and discussed the procedure including the risks, benefits and alternatives for the proposed anesthesia with the patient or authorized representative who has indicated his/her understanding and acceptance.     Plan Discussed with: Surgeon  Anesthesia Plan Comments:         Anesthesia Quick Evaluation

## 2017-06-13 ENCOUNTER — Encounter: Payer: Self-pay | Admitting: Gastroenterology

## 2017-06-13 LAB — SURGICAL PATHOLOGY

## 2017-06-15 ENCOUNTER — Encounter: Payer: Self-pay | Admitting: Gastroenterology

## 2017-06-19 ENCOUNTER — Ambulatory Visit (INDEPENDENT_AMBULATORY_CARE_PROVIDER_SITE_OTHER): Payer: Medicare Other | Admitting: Internal Medicine

## 2017-06-19 DIAGNOSIS — E669 Obesity, unspecified: Secondary | ICD-10-CM | POA: Diagnosis not present

## 2017-06-19 DIAGNOSIS — R200 Anesthesia of skin: Secondary | ICD-10-CM | POA: Diagnosis not present

## 2017-06-19 DIAGNOSIS — E1121 Type 2 diabetes mellitus with diabetic nephropathy: Secondary | ICD-10-CM | POA: Diagnosis not present

## 2017-06-19 DIAGNOSIS — I10 Essential (primary) hypertension: Secondary | ICD-10-CM | POA: Diagnosis not present

## 2017-06-19 DIAGNOSIS — R202 Paresthesia of skin: Secondary | ICD-10-CM | POA: Diagnosis not present

## 2017-06-19 DIAGNOSIS — Z794 Long term (current) use of insulin: Secondary | ICD-10-CM

## 2017-06-19 LAB — POCT GLYCOSYLATED HEMOGLOBIN (HGB A1C): Hemoglobin A1C: 6.3

## 2017-06-19 NOTE — Progress Notes (Signed)
Subjective:  Patient ID: Sandra Hendrix, female    DOB: [birth year]  Age: 71 y.o. MRN: [id number]  CC: The primary encounter diagnosis was Type 2 diabetes mellitus with diabetic nephropathy, with long-term current use of insulin ([place]). Diagnoses of Obesity (BMI 30.0-34.9), Essential hypertension, and Numbness and tingling in right hand were also pertinent to this visit.  [name] presents for follow up on type 2 DM.  She feels generally well.  She has had no blood sugars over 160 for the past several weeks.  fastings elevated to 160  Taking 20 units of levemir  Late at night usually at 2 am when she goes to bed metormin and Januvia in the morning . BS was low this morning    Lab Results  Component Value Date   HGBA1C 6.3 06/19/2017   Received regular flu vaccine last week at [facility]   Follow up for endometrial cancer on October 3.  . Right hand has been going numb  With use,  Combing hair,  Sewing .  No pain ,   No neck pain   Outpatient Medications Prior to Visit  Medication Sig Dispense Refill  . ACCU-CHEK AVIVA PLUS test strip TEST ONCE DAILY 100 each 1  . acetaminophen (TYLENOL) 500 MG tablet Take 2 tablets (1,000 mg total) by mouth every 6 (six) hours. 65 tablet 1  . albuterol (PROVENTIL HFA;VENTOLIN HFA) 108 (90 BASE) MCG/ACT inhaler Inhale 2 puffs into the lungs every 6 (six) hours as needed for wheezing or shortness of breath.     . Ascorbic Acid (VITAMIN C) 1000 MG tablet Take 1,000 mg by mouth every evening.     Marland Kitchen aspirin EC 81 MG tablet Take 81 mg by mouth every evening.     Marland Kitchen atorvastatin (LIPITOR) 20 MG tablet Take 20 mg by mouth at bedtime.     Marland Kitchen azelastine (ASTELIN) 0.1 % nasal spray Place 1 spray into both nostrils daily.    . Biotin 1000 MCG tablet Take 1,000 mcg by mouth 2 (two) times daily.    . Calcium Carbonate-Vitamin D 300-100 MG-UNIT CAPS Take 600 mg by mouth 2 (two) times daily.    . Cholecalciferol (D3-1000 PO) Take 1 tablet by mouth  daily.     Marland Kitchen CINNAMON PO Take 1,000 mg by mouth 2 (two) times daily.    Marland Kitchen docusate sodium (COLACE) 100 MG capsule Take 1 capsule (100 mg total) by mouth 2 (two) times daily. 20 capsule 0  . fish oil-omega-3 fatty acids 1000 MG capsule Take 1 g by mouth every evening.     . fluticasone (FLONASE) 50 MCG/ACT nasal spray Place 1 spray into both nostrils daily. 48 g 1  . furosemide (LASIX) 20 MG tablet TAKE 1 TABLET BY MOUTH  EVERY OTHER DAY 45 tablet 3  . glipiZIDE (GLUCOTROL XL) 5 MG 24 hr tablet Take 1 tablet (5 mg total) by mouth daily. 90 tablet 1  . insulin detemir (LEVEMIR) 100 UNIT/ML injection Inject 0.15 mLs (15 Units total) into the skin at bedtime. (Patient taking differently: Inject 20 Units into the skin at bedtime. ) 10 mL 11  . Lancet Devices (ACCU-CHEK SOFTCLIX) lancets Use to test blood sugars 1 -2 times daily 1 each 3  . letrozole (FEMARA) 2.5 MG tablet Take 1 tablet (2.5 mg total) by mouth daily. 30 tablet 6  . loratadine (CLARITIN) 10 MG tablet Take 10 mg by mouth daily.    Marland Kitchen losartan-hydrochlorothiazide (HYZAAR) 50-12.5 MG tablet TAKE  1 TABLET BY MOUTH  DAILY 90 tablet 1  . magnesium oxide (MAG-OX) 400 MG tablet Take 400 mg by mouth daily.    . meclizine (ANTIVERT) 25 MG tablet Take 25 mg by mouth 2 (two) times daily as needed for dizziness.    . medroxyPROGESTERone (PROVERA) 10 MG tablet Take by mouth.    . metFORMIN (GLUCOPHAGE-XR) 750 MG 24 hr tablet TAKE 2 TABLETS BY MOUTH  DAILY WITH BREAKFAST 180 tablet 1  . Multiple Vitamin (MULTIVITAMIN) tablet Take 1 tablet by mouth daily.    Marland Kitchen omeprazole (PRILOSEC) 20 MG capsule Take 20 mg by mouth daily.    . pantoprazole (PROTONIX) 40 MG tablet Take by mouth.    . Probiotic Product (PROBIOTIC-10) CAPS Take 1 capsule by mouth daily.     . sennosides-docusate sodium (SENOKOT-S) 8.6-50 MG tablet Take 1 tablet by mouth daily.    . sertraline (ZOLOFT) 50 MG tablet TAKE 1 TABLET BY MOUTH  DAILY 90 tablet 1  . sitaGLIPtin (JANUVIA) 100 MG  tablet Take 1 tablet (100 mg total) by mouth daily. 90 tablet 3  . verapamil (VERELAN PM) 180 MG 24 hr capsule TAKE 1 CAPSULE BY MOUTH  DAILY 90 capsule 1  . Black Cohosh 20 MG TABS Take 20 mg by mouth daily.    . cephALEXin (KEFLEX) 500 MG capsule Take 1 capsule (500 mg total) by mouth 2 (two) times daily. (Patient not taking: Reported on 06/19/2017) 14 capsule 0  . lisinopril (PRINIVIL,ZESTRIL) 20 MG tablet Take 20 mg by mouth daily.     No facility-administered medications prior to visit.     Review of Systems;  Patient denies headache, fevers, malaise, unintentional weight loss, skin rash, eye pain, sinus congestion and sinus pain, sore throat, dysphagia,  hemoptysis , cough, dyspnea, wheezing, chest pain, palpitations, orthopnea, edema, abdominal pain, nausea, melena, diarrhea, constipation, flank pain, dysuria, hematuria, urinary  Frequency, nocturia, numbness, tingling, seizures,  Focal weakness, Loss of consciousness,  Tremor, insomnia, depression, anxiety, and suicidal ideation.      Objective:  There were no vitals taken for this visit.  BP Readings from Last 3 Encounters:  06/12/17 114/65  05/26/17 110/80  05/16/17 (!) 96/58    Wt Readings from Last 3 Encounters:  06/12/17 176 lb (79.8 kg)  05/26/17 173 lb 9.6 oz (78.7 kg)  05/16/17 176 lb 6.4 oz (80 kg)    General appearance: alert, cooperative and appears stated age Ears: normal TM's and external ear canals both ears Throat: lips, mucosa, and tongue normal; teeth and gums normal Neck: no adenopathy, no carotid bruit, supple, symmetrical, trachea midline and thyroid not enlarged, symmetric, no tenderness/mass/nodules Back: symmetric, no curvature. ROM normal. No CVA tenderness. Lungs: clear to auscultation bilaterally Heart: regular rate and rhythm, S1, S2 normal, no murmur, click, rub or gallop Abdomen: soft, non-tender; bowel sounds normal; no masses,  no organomegaly Pulses: 2+ and symmetric Skin: Skin color,  texture, turgor normal. No rashes or lesions Lymph nodes: Cervical, supraclavicular, and axillary nodes normal.  Lab Results  Component Value Date   HGBA1C 6.3 06/19/2017   HGBA1C 7.8 (H) 02/22/2017   HGBA1C 7.9 (H) 01/25/2017    Lab Results  Component Value Date   CREATININE 1.05 (H) 02/27/2017   CREATININE 1.04 02/22/2017   CREATININE 1.03 (H) 01/25/2017    Lab Results  Component Value Date   WBC 4.2 05/15/2017   HGB 12.0 05/15/2017   HCT 34.5 (L) 05/15/2017   PLT 316 05/15/2017   GLUCOSE  109 (H) 02/27/2017   CHOL 165 02/22/2017   TRIG 67.0 02/22/2017   HDL 67.50 02/22/2017   LDLDIRECT 80.0 02/22/2017   LDLCALC 84 02/22/2017   ALT 19 02/27/2017   AST 26 02/27/2017   NA 138 02/27/2017   K 3.8 02/27/2017   CL 107 02/27/2017   CREATININE 1.05 (H) 02/27/2017   BUN 28 (H) 02/27/2017   CO2 23 02/27/2017   TSH 2.69 11/07/2014   INR 1.1 01/23/2009   HGBA1C 6.3 06/19/2017   MICROALBUR 2.2 (H) 02/22/2017    No results found.  Assessment & Plan:   Problem List Items Addressed This Visit    DM (diabetes mellitus), type 2 with renal complications ([place]) - Primary (Chronic)    Currently well-controlled on current medications .  hemoglobin A1c is at goal of less than 7.0 . Patient is reminded to schedule an annual eye exam and foot exam is normal today. Patient has no microalbuminuria. Patient is tolerating statin therapy for CAD risk reduction and on ACE/ARB for renal protection and hypertension   Lab Results  Component Value Date   HGBA1C 6.3 06/19/2017   Lab Results  Component Value Date   MICROALBUR 2.2 (H) 02/22/2017   Lab Results  Component Value Date   CREATININE 1.05 (H) 02/27/2017          Relevant Orders   POCT HgB A1C (Completed)   Essential hypertension    Well controlled on current regimen. Renal function stable, no changes today.  Lab Results  Component Value Date   CREATININE 1.05 (H) 02/27/2017   Lab Results  Component Value Date   NA  138 02/27/2017   K 3.8 02/27/2017   CL 107 02/27/2017   CO2 23 02/27/2017         Numbness and tingling in right hand    CTS suspected .  Offered EMG/nerve conduction studies.       Relevant Orders   NCV with EMG(electromyography)   Obesity (BMI 30.0-34.9)    I have addressed  BMI and recommended wt loss of 10% of body weigh over the next 6 months using a low glycemic index diet and regular exercise a minimum of 5 days per week.         A total of 25 minutes of face to face time was spent with patient more than half of which was spent in counselling about the above mentioned conditions  and coordination of care   I have discontinued Ms. Camero's cephALEXin, Black Cohosh, and lisinopril. I am also having her maintain her Cholecalciferol (D3-1000 PO), multivitamin, fish oil-omega-3 fatty acids, albuterol, aspirin EC, fluticasone, PROBIOTIC-10, vitamin C, CINNAMON PO, loratadine, atorvastatin, azelastine, Biotin, insulin detemir, sitaGLIPtin, acetaminophen, docusate sodium, metFORMIN, verapamil, sertraline, losartan-hydrochlorothiazide, meclizine, glipiZIDE, accu-chek softclix, letrozole, medroxyPROGESTERone, pantoprazole, ACCU-CHEK AVIVA PLUS, Calcium Carbonate-Vitamin D, magnesium oxide, omeprazole, sennosides-docusate sodium, and furosemide.  No orders of the defined types were placed in this encounter.   Medications Discontinued During This Encounter  Medication Reason  . Black Cohosh 20 MG TABS Patient has not taken in last 30 days  . cephALEXin (KEFLEX) 500 MG capsule Patient has not taken in last 30 days  . lisinopril (PRINIVIL,ZESTRIL) 20 MG tablet Patient has not taken in last 30 days    Follow-up: No Follow-up on file.   Crecencio Mc, MD

## 2017-06-19 NOTE — Patient Instructions (Addendum)
Your diabetes is now under excellent control  . Please continue your current medications. return in 3 months for follow up on diabetes and make sure you are seeing your eye doctor at least once a year.    Continue the levemir at 20 units  And take it at the same time every night  Start walking for 30 minutes daily   I have ordered the EMG Nerve conduction studies of your right hand to rule out  Carpal Tunnel Syndrome

## 2017-06-20 DIAGNOSIS — G5601 Carpal tunnel syndrome, right upper limb: Secondary | ICD-10-CM | POA: Insufficient documentation

## 2017-06-20 DIAGNOSIS — E669 Obesity, unspecified: Secondary | ICD-10-CM | POA: Insufficient documentation

## 2017-06-20 DIAGNOSIS — E663 Overweight: Secondary | ICD-10-CM | POA: Insufficient documentation

## 2017-06-20 NOTE — Assessment & Plan Note (Addendum)
CTS suspected .  Offered EMG/nerve conduction studies.

## 2017-06-20 NOTE — Assessment & Plan Note (Signed)
I have addressed  BMI and recommended wt loss of 10% of body weigh over the next 6 months using a low glycemic index diet and regular exercise a minimum of 5 days per week.   

## 2017-06-20 NOTE — Assessment & Plan Note (Signed)
Currently well-controlled on current medications .  hemoglobin A1c is at goal of less than 7.0 . Patient is reminded to schedule an annual eye exam and foot exam is normal today. Patient has no microalbuminuria. Patient is tolerating statin therapy for CAD risk reduction and on ACE/ARB for renal protection and hypertension   Lab Results  Component Value Date   HGBA1C 6.3 06/19/2017   Lab Results  Component Value Date   MICROALBUR 2.2 (H) 02/22/2017   Lab Results  Component Value Date   CREATININE 1.05 (H) 02/27/2017

## 2017-06-20 NOTE — Assessment & Plan Note (Signed)
Well controlled on current regimen. Renal function stable, no changes today.  Lab Results  Component Value Date   CREATININE 1.05 (H) 02/27/2017   Lab Results  Component Value Date   NA 138 02/27/2017   K 3.8 02/27/2017   CL 107 02/27/2017   CO2 23 02/27/2017

## 2017-06-21 NOTE — Telephone Encounter (Signed)
Error

## 2017-06-28 ENCOUNTER — Ambulatory Visit
Admission: RE | Admit: 2017-06-28 | Discharge: 2017-06-28 | Disposition: A | Discharge status: Home / Self Care | Payer: Medicare Other | Source: Ambulatory Visit | Attending: Radiation Oncology | Admitting: Radiation Oncology

## 2017-06-28 ENCOUNTER — Encounter: Payer: Self-pay | Admitting: Radiation Oncology

## 2017-06-28 ENCOUNTER — Ambulatory Visit: Payer: Medicare Other

## 2017-06-28 VITALS — BP 121/76 | HR 68 | Temp 98.2°F | Resp 20 | Wt 175.9 lb

## 2017-06-28 DIAGNOSIS — Z90710 Acquired absence of both cervix and uterus: Secondary | ICD-10-CM | POA: Diagnosis not present

## 2017-06-28 DIAGNOSIS — Z923 Personal history of irradiation: Secondary | ICD-10-CM | POA: Insufficient documentation

## 2017-06-28 DIAGNOSIS — Z90722 Acquired absence of ovaries, bilateral: Secondary | ICD-10-CM | POA: Diagnosis not present

## 2017-06-28 DIAGNOSIS — C541 Malignant neoplasm of endometrium: Secondary | ICD-10-CM | POA: Insufficient documentation

## 2017-06-28 DIAGNOSIS — C55 Malignant neoplasm of uterus, part unspecified: Secondary | ICD-10-CM

## 2017-06-28 DIAGNOSIS — C786 Secondary malignant neoplasm of retroperitoneum and peritoneum: Secondary | ICD-10-CM | POA: Insufficient documentation

## 2017-06-28 NOTE — Progress Notes (Signed)
Radiation Oncology Follow up Note  Name: Sandra Hendrix   Date:   06/28/2017 MRN:  [id number] DOB: March 01, 1946    This 71 y.o. female presents to the clinic today for one-month follow-up status post.external beam whole pelvic radiation for FIGO grade 1 stage IB endometrial adenocarcinoma with positive peritoneal cytology  REFERRING PROVIDER: Crecencio Mc, MD  HPI: patient is a 71 year old female now out 1 month having completed whole pelvic radiation for a FIGO grade 1 stage IB endometrial adenocarcinoma with positive peritoneal cytology at the time of hysterectomy and sentinel node lymph node mapping..tumor did extend into the outer half the myometrium with 99% myometrial involvement. She is seen today in routine follow-up and is doing well. She specifically denies diarrhea or any increased lower urinary tract symptoms. She's having no vaginal discharge.  COMPLICATIONS OF TREATMENT: none  FOLLOW UP COMPLIANCE: keeps appointments   PHYSICAL EXAM:  BP 121/76   Pulse 68   Temp 98.2 F (36.8 C)   Resp 20   Wt 175 lb 14.8 oz (79.8 kg)   BMI 33.24 kg/m  On speculum examination vaginal mucosa shows no evidence of mass or nodularity. Vaginal apex is well-healed. On bimanual examination no evidence of parametrial mass or rectal abnormality is I met is identified. Well-developed well-nourished patient in NAD. HEENT reveals PERLA, EOMI, discs not visualized.  Oral cavity is clear. No oral mucosal lesions are identified. Neck is clear without evidence of cervical or supraclavicular adenopathy. Lungs are clear to A&P. Cardiac examination is essentially unremarkable with regular rate and rhythm without murmur rub or thrill. Abdomen is benign with no organomegaly or masses noted. Motor sensory and DTR levels are equal and symmetric in the upper and lower extremities. Cranial nerves II through XII are grossly intact. Proprioception is intact. No peripheral adenopathy or edema is identified. No motor  or sensory levels are noted. Crude visual fields are within normal range.  RADIOLOGY RESULTS: no current films for review  PLAN: present time patient is doing well now 1 month out from external beam radiation therapy. I'm please were overall progress. I've asked to see her back in 4 months for follow-up. She continues close follow-up care in or has appointments with GYN oncology. Patient knows to call with any concerns.  I would like to take this opportunity to thank you for allowing me to participate in the care of your patient.Armstead Peaks., MD

## 2017-08-02 ENCOUNTER — Telehealth: Payer: Self-pay

## 2017-08-02 ENCOUNTER — Encounter: Payer: Self-pay | Admitting: Obstetrics and Gynecology

## 2017-08-02 ENCOUNTER — Inpatient Hospital Stay: Payer: Medicare Other | Attending: Obstetrics and Gynecology | Admitting: Obstetrics and Gynecology

## 2017-08-02 VITALS — BP 131/80 | HR 67 | Temp 97.8°F | Resp 18 | Ht 61.0 in | Wt 179.5 lb

## 2017-08-02 DIAGNOSIS — N183 Chronic kidney disease, stage 3 (moderate): Secondary | ICD-10-CM

## 2017-08-02 DIAGNOSIS — Z79899 Other long term (current) drug therapy: Secondary | ICD-10-CM | POA: Diagnosis not present

## 2017-08-02 DIAGNOSIS — Z90710 Acquired absence of both cervix and uterus: Secondary | ICD-10-CM | POA: Insufficient documentation

## 2017-08-02 DIAGNOSIS — Z7982 Long term (current) use of aspirin: Secondary | ICD-10-CM | POA: Insufficient documentation

## 2017-08-02 DIAGNOSIS — N393 Stress incontinence (female) (male): Secondary | ICD-10-CM | POA: Diagnosis not present

## 2017-08-02 DIAGNOSIS — Z923 Personal history of irradiation: Secondary | ICD-10-CM | POA: Insufficient documentation

## 2017-08-02 DIAGNOSIS — C55 Malignant neoplasm of uterus, part unspecified: Secondary | ICD-10-CM

## 2017-08-02 DIAGNOSIS — Z6833 Body mass index (BMI) 33.0-33.9, adult: Secondary | ICD-10-CM | POA: Insufficient documentation

## 2017-08-02 DIAGNOSIS — E1122 Type 2 diabetes mellitus with diabetic chronic kidney disease: Secondary | ICD-10-CM | POA: Diagnosis not present

## 2017-08-02 DIAGNOSIS — Z794 Long term (current) use of insulin: Secondary | ICD-10-CM | POA: Diagnosis not present

## 2017-08-02 DIAGNOSIS — Z8542 Personal history of malignant neoplasm of other parts of uterus: Secondary | ICD-10-CM | POA: Diagnosis present

## 2017-08-02 DIAGNOSIS — E669 Obesity, unspecified: Secondary | ICD-10-CM | POA: Diagnosis not present

## 2017-08-02 DIAGNOSIS — Z79811 Long term (current) use of aromatase inhibitors: Secondary | ICD-10-CM | POA: Insufficient documentation

## 2017-08-02 DIAGNOSIS — E785 Hyperlipidemia, unspecified: Secondary | ICD-10-CM | POA: Insufficient documentation

## 2017-08-02 DIAGNOSIS — G4733 Obstructive sleep apnea (adult) (pediatric): Secondary | ICD-10-CM | POA: Insufficient documentation

## 2017-08-02 DIAGNOSIS — Z90722 Acquired absence of ovaries, bilateral: Secondary | ICD-10-CM | POA: Diagnosis not present

## 2017-08-02 DIAGNOSIS — K2270 Barrett's esophagus without dysplasia: Secondary | ICD-10-CM | POA: Diagnosis not present

## 2017-08-02 NOTE — Telephone Encounter (Signed)
Please call and reschedule patient   Copied from [place] #[id number]. Topic: Appointment Scheduling - Prior Auth Required for Appointment >> Aug 02, 2017 10:33 AM Ahmed Prima L wrote: Patient is requesting a change of an appointment for [facility]. She has it scheduled right now for 11/29/17. Can you forward this to the health coach and have her call her and reschedule this. Thanks Per scheduling protocol, this appointment requires a prior authorization prior to scheduling.  Route to department's PEC pool.

## 2017-08-02 NOTE — Telephone Encounter (Signed)
Rescheduled to 12/06/17. Pt aware

## 2017-08-02 NOTE — Progress Notes (Signed)
Gynecologic Oncology Consult Visit   Referring Provider: Brayton Mars, MD  Chief Concern: Stage IB WELL-DIFFERENTIATED ENDOMETRIOID ADENOCARCINOMA of the endometrium  Subjective:  Sandra Hendrix is a 71 y.o. female who is seen in consultation from Dr. Enzo Bi for grade 1 endometrial cancer who returns today for routine follow-up and pelvic exam.   Today she reports feeling well. She is taking the letrozole and reports some hot flashes which do not interfere with her sleep. She continues to have some urinary incontinence with sneezing or coughing which has not worsened. She wears a pad. She has not made appointment with u[facility]-onc at [facility]. She has completed XRT which she tolerated well.    Cancer history She presented with postmenopausal bleeding. Her evaluation included Pap on 10/17/2016 with endometrial cells present otherwise NILM; and on 11/16/2016 endometrial biopsy that revealed atypical glandular epithelium.   Her evaluation also included pelvic US 10/17/2016 that showed uterine mass.  She underwent hysteroscopy/D&C on 01/02/2017 that revealed grade 1 endometrioid endometrial cancer.  Definitive cancer surgery was scheduled 5/9, but was delayed a month because her brother died the day before.    Underwent TLH, BSO, SLN mapping and biopsieson 03/01/17  DIAGNOSIS:  A. SENTINEL LYMPH NODE 1, LEFT EXTERNAL ILIAC; EXCISION:  - NO TUMOR SEEN IN ONE LYMPH NODE (0/1).   B. SENTINEL LYMPH NODE 2, LEFT EXTERNAL ILIAC; EXCISION:  - NO TUMOR SEEN IN ONE LYMPH NODE (0/1).   C. SENTINEL LYMPH NODE 3, LEFT EXTERNAL ILIAC; EXCISION:  - NO TUMOR SEEN IN ONE LYMPH NODE (0/1).   D. SENTINEL LYMPH NODE, RIGHT EXTERNAL ILIAC; EXCISION:  - NO TUMOR SEEN IN ONE LYMPH NODE (0/1).   E. UTERUS WITH CERVIX, BILATERAL FALLOPIAN TUBES AND OVARIES;  LAPAROSCOPIC TOTAL HYSTERECTOMY AND BILATERAL SALPINGO-OOPHORECTOMY:  - ENDOMETRIOID CARCINOMA WITH SQUAMOUS DIFFERENTIATION, FIGO GRADE I.  -  INVASION EXTENDS INTO THE OUTER HALF OF THE MYOMETRIUM.  - AGE-RELATED CHANGE, BILATERAL OVARIES.  - PARATUBAL CYSTS, BILATERAL FALLOPIAN TUBES.  - SEE SUMMARY BELOW.   Surgical Pathology Cancer Case Summary  ENDOMETRIUM:  Procedure: Laparoscopic total hysterectomy, bilateral  salpingo-oophorectomy and sentinel lymph node excision  Histologic Type: Endometrioid carcinoma with squamous differentiation  Histologic Grade: FIGO grade 1  Myometrial Invasion: Present; Depth of invasion: 26.75 mm; Myometrial  thickness (millimeters): 27 mm; Percentage of myometrial invasion: 99%  Uterine Serosa Involvement: Not identified  Cervical Stromal Involvement: Not identified  Other Tissue/Organ Involvement: Not identified  Peritoneal/ Ascitic Fluid: Positive for malignancy  Margins: Not applicable  Lymphovascular Invasion: Not identified  Regional Lymph Nodes:  # of pelvicnodes with macrometastasis: 0  # of pelvic nodes with micrometastasis: 0  Total # of pelvic nodes examined: 4  # of pelvic sentinel nodes examined: 4  Laterality of pelvic nodes examined: left and right  Pathologic Stage Classification (pTNM, AJCC 8th Edition): pT1b pN0 (sn)  / FIGO IB   She received adjuvant whole pelvic radiation w/ Dr. Baruch Gouty over 5 weeks completing 05/23/17. She did have a post-radiation UTI which was treated and resolved with antibiotics.   She initiated on Letrozole on 05/15/17.   Problem List: Patient Active Problem List   Diagnosis Date Noted  . Obesity (BMI 30.0-34.9) 06/20/2017  . Numbness and tingling in right hand 06/20/2017  . Endometrioid adenocarcinoma of uterus ([place]) 01/19/2017  . Status post D&C 01/02/2017  . OSA on CPAP 11/24/2016  . Cyst of ovary 11/16/2016  . Cervical stenosis (uterine cervix) 11/16/2016  . Cyst, vulva 10/11/2015  .  CKD (chronic kidney disease) stage 3, GFR 30-59 ml/min (HCC) 11/07/2014  . GERD (gastroesophageal reflux disease) 12/26/2013  . Irritable bowel  syndrome with constipation 12/26/2013  . Hyperlipidemia LDL goal <100 07/15/2013  . Chronic venous insufficiency 03/21/2013  . DM (diabetes mellitus), type 2 with renal complications ([place]) 46/96/2952  . Lack of libido 11/05/2012  . Barrett's esophagus 11/05/2012  . Hiatal hernia 11/05/2012  . Essential hypertension 11/05/2012  . Urinary incontinence     Past Medical History: Past Medical History:  Diagnosis Date  . Allergy   . Anxiety   . Arthritis   . Barrett's esophagus determined by endoscopy   . Cancer [facility])    Uterine Cancer  . Chronic kidney disease   . Depression   . Diabetes mellitus without complication ([place])   . Difficult intubation    "not sure what happened,asleep was told had problem with intubation"  . Diverticulosis   . GERD (gastroesophageal reflux disease)   . History of bronchitis   . History of hiatal hernia   . Hyperlipidemia   . Hypertension   . Sleep apnea    Use C- PAP  . Urinary incontinence   . Varicose veins     Past Surgical History: Past Surgical History:  Procedure Laterality Date  . CHOLECYSTECTOMY    . JOINT REPLACEMENT Right 2007   Total Knee Replacement  . JOINT REPLACEMENT Left 2004   Total Knee Replacement  . ROTATOR CUFF REPAIR Right   . TONSILLECTOMY     as a child  . TUBAL LIGATION    . UPPER GI ENDOSCOPY     x2    Past Gynecologic History:  Patient is postmenopausal. Menarche: 8 History of Abnormal pap: yes, endometrial cells w/o NILM Last pap: 2018 as per interval history  Last Pap:10/17/2016 negative for intraepithelial lesions or malignancy. Benign reparative/reactive changes. ENDOMETRIAL CELLS PRESENT Sexually active: no  She is due for 2018 mammogram.   OB History:  OB History  Gravida Para Term Preterm AB Living  2 2 2     2   SAB TAB Ectopic Multiple Live Births          2    # Outcome Date GA Lbr Len/2nd Weight Sex Delivery Anes PTL Lv  2 Term 1972   9 lb (4.082 kg) F Vag-Spont   LIV  1 Term 1970   9  lb (4.082 kg) F Vag-Spont   LIV      Family History: Family History  Problem Relation Age of Onset  . Stroke Mother   . Hypertension Mother   . Lupus Mother   . Alcohol abuse Father   . Diabetes Father   . Cancer Father        gallbladder ca into liver   . Arthritis Sister   . Diabetes Sister   . Hyperlipidemia Daughter   . Hypertension Daughter   . Cancer Maternal Aunt        ovarian ca  . Diabetes Paternal Aunt   . Drug abuse Paternal Aunt   . Cancer Maternal Grandfather        colon ca  . Hypertension Brother     Social History: Social History   Socioeconomic History  . Marital status: Married    Spouse name: Not on file  . Number of children: 2  . Years of education: 33  . Highest education level: Some college, no degree  Social Needs  . Financial resource strain: Not on file  . Food  insecurity - worry: Never true  . Food insecurity - inability: Never true  . Transportation needs - medical: No  . Transportation needs - non-medical: No  Occupational History  . Not on file  Tobacco Use  . Smoking status: Never Smoker  . Smokeless tobacco: Never Used  Substance and Sexual Activity  . Alcohol use: No  . Drug use: No  . Sexual activity: Yes    Comment: not often  Other Topics Concern  . Not on file  Social History Narrative  . Not on file    Allergies: Allergies  Allergen Reactions  . Clindamycin/Lincomycin Rash  . Codeine Rash  . Morphine And Related Nausea Only  . Talwin [Pentazocine] Nausea And Vomiting  . Lisinopril Cough    cough  . Naloxone Other (See Comments)    Had issues with sedation and vomiting with EGD  . Oxycodone-Acetaminophen Other (See Comments)    Pt states this medication makes her bowels stop moving    Current Medications: Current Outpatient Medications  Medication Sig Dispense Refill  . ACCU-CHEK AVIVA PLUS test strip TEST ONCE DAILY 100 each 1  . acetaminophen (TYLENOL) 500 MG tablet Take 2 tablets (1,000 mg total) by  mouth every 6 (six) hours. (Patient taking differently: Take 1,000 mg every 6 (six) hours as needed by mouth. ) 65 tablet 1  . albuterol (PROVENTIL HFA;VENTOLIN HFA) 108 (90 BASE) MCG/ACT inhaler Inhale 2 puffs into the lungs every 6 (six) hours as needed for wheezing or shortness of breath.     . Ascorbic Acid (VITAMIN C) 1000 MG tablet Take 1,000 mg by mouth every evening.     Marland Kitchen aspirin EC 81 MG tablet Take 81 mg by mouth every evening.     Marland Kitchen atorvastatin (LIPITOR) 20 MG tablet Take 20 mg by mouth at bedtime.     Marland Kitchen azelastine (ASTELIN) 0.1 % nasal spray Place 1 spray into both nostrils daily.    . Biotin 1000 MCG tablet Take 1,000 mcg by mouth 2 (two) times daily.    . Calcium Carbonate-Vitamin D 300-100 MG-UNIT CAPS Take 600 mg at bedtime by mouth.     . Cholecalciferol (D3-1000 PO) Take 1 tablet by mouth daily.     Marland Kitchen CINNAMON PO Take 1,000 mg by mouth 2 (two) times daily.    Marland Kitchen docusate sodium (COLACE) 100 MG capsule Take 1 capsule (100 mg total) by mouth 2 (two) times daily. (Patient taking differently: Take 100 mg daily by mouth. ) 20 capsule 0  . fish oil-omega-3 fatty acids 1000 MG capsule Take 1 g by mouth every evening.     . fluticasone (FLONASE) 50 MCG/ACT nasal spray Place 1 spray into both nostrils daily. 48 g 1  . furosemide (LASIX) 20 MG tablet TAKE 1 TABLET BY MOUTH  EVERY OTHER DAY 45 tablet 3  . glipiZIDE (GLUCOTROL XL) 5 MG 24 hr tablet Take 1 tablet (5 mg total) by mouth daily. 90 tablet 1  . insulin detemir (LEVEMIR) 100 UNIT/ML injection Inject 0.15 mLs (15 Units total) into the skin at bedtime. (Patient taking differently: Inject 20 Units into the skin at bedtime. ) 10 mL 11  . Lancet Devices (ACCU-CHEK SOFTCLIX) lancets Use to test blood sugars 1 -2 times daily 1 each 3  . letrozole (FEMARA) 2.5 MG tablet Take 1 tablet (2.5 mg total) by mouth daily. 30 tablet 6  . loratadine (CLARITIN) 10 MG tablet Take 10 mg by mouth daily.    Marland Kitchen losartan-hydrochlorothiazide (HYZAAR)  50-12.5 MG tablet TAKE 1 TABLET BY MOUTH  DAILY 90 tablet 1  . magnesium oxide (MAG-OX) 400 MG tablet Take 400 mg by mouth daily.    . meclizine (ANTIVERT) 25 MG tablet Take 25 mg by mouth 2 (two) times daily as needed for dizziness.    . medroxyPROGESTERone (PROVERA) 10 MG tablet Take by mouth.    . metFORMIN (GLUCOPHAGE-XR) 750 MG 24 hr tablet TAKE 2 TABLETS BY MOUTH  DAILY WITH BREAKFAST 180 tablet 1  . Multiple Vitamin (MULTIVITAMIN) tablet Take 1 tablet by mouth daily.    . pantoprazole (PROTONIX) 40 MG tablet Take by mouth.    . Probiotic Product (PROBIOTIC-10) CAPS Take 1 capsule by mouth daily.     Marland Kitchen Propylene Glycol (SYSTANE BALANCE OP) Apply 2 drops daily to eye. Both eyes    . sertraline (ZOLOFT) 50 MG tablet TAKE 1 TABLET BY MOUTH  DAILY 90 tablet 1  . sitaGLIPtin (JANUVIA) 100 MG tablet Take 1 tablet (100 mg total) by mouth daily. 90 tablet 3  . verapamil (VERELAN PM) 180 MG 24 hr capsule TAKE 1 CAPSULE BY MOUTH  DAILY 90 capsule 1  . omeprazole (PRILOSEC) 20 MG capsule Take 20 mg by mouth daily.    . sennosides-docusate sodium (SENOKOT-S) 8.6-50 MG tablet Take 1 tablet by mouth daily.     No current facility-administered medications for this visit.     Review of Systems General: positive for night sweats and weight gain, negative for, fevers, chills Skin: negative for dry skin, hair changes, lumps, mole changes and rash Eyes: negative for, changes in vision, pain, diplopia HEENT: negative for hearing loss, nasal congestion and sore throat Pulmonary: negative for, dyspnea, productive cough Cardiac: negative for, palpitations, syncope, discomfort Gastrointestinal: positive for dyspepsia, dysphagia and reflux symptoms, negative for, nausea, vomiting, constipation, diarrhea Genitourinary/Sexual: positive for urinary incontinence, negative for, dysuria, discharge. Stress incontinence h/o SVD x2 with babies >8lbs.   Ob/Gyn: denies abnormal bleeding Musculoskeletal: positive for  stiff joints, negative for, pain Hematology: negative for abnormal bleeding or bruising Neurologic/Psych: negative for, headaches, seizures, paralysis, weakness  Objective:  Physical Examination:  BP 131/80   Pulse 67   Temp 97.8 F (36.6 C) (Tympanic)   Resp 18   Ht 5\' 1"  (1.549 m)   Wt 179 lb 8 oz (81.4 kg)   BMI 33.92 kg/m   ECOG Performance Status: 1 - Symptomatic but completely ambulatory  General appearance: alert, cooperative and appears stated age HEENT:PERRLA, extra ocular movement intact and sclera clear, anicteric Lymph node survey: non-palpable, axillary, inguinal, supraclavicular Cardiovascular: regular rate and rhythm, no murmurs or gallops Respiratory: normal air entry, lungs clear to auscultation Abdomen: soft, non-tender, without masses or organomegaly, no hernias and well healed incision Back: inspection of back is normal Extremities: extremities normal, atraumatic, no cyanosis or edema Skin exam - normal coloration and turgor, no rashes, no suspicious skin lesions noted. Neurological exam reveals alert, oriented, normal speech, no focal findings or movement disorder noted.  Pelvic: exam chaperoned by nurse;  Vulva: normal appearing vulva w/o masses, tenderness or lesions. Vagina: normal vagina with well healed cuff. Bimanual/RV: no evidences of masses.   Assessment:  Sandra Hendrix is a 71 y.o. female w/ hx of stage IB, grade 1 endometrioid cancer w/ hx of of disease invading full thickness of myometrium and positive pelvic peritoneal cytology, bilaterial sentinel nodes, cervix, and advexa were negative. Currently she is s/p whole pelvic radiation and now on Letrozole.  She feels well, tolerating  letrozole w/o significant side effects other than hot flashes and no evidence of disease on exam.   Stress urinary incontinence is stable and unchanged.   Medical co-morbidities complicating care include: OSA on CPAP, HTN DM, CKD, and obesity with BMI 33.92.    Plan:   Problem List Items Addressed This Visit    Endometrioid adenocarcinoma of uterus ([place]) - Primary     Will plan for patient to continue anti-hormone therapy (letrozole) and return to clinic in 4 months for pelvic & follow-up alternating visits with Dr. Enzo Bi. Patient to f/u w/ Dr. Baruch Gouty 10/2017.   Urinary incontinence- symptoms are stable. If patient wishes, she can set up appt with Dr. Sharlett Iles at [facility] for further evaluation.   Verlon Au, NP  I personally interviewed and examined the patient. Agreed with the above/below plan of care. Patient/family questions were answered.  Mellody Drown, MD   CC: Brayton Mars, MD  PCP: Crecencio Mc, MD [address] [place], [place] [address] [phone]

## 2017-08-02 NOTE — Progress Notes (Signed)
  Oncology Nurse Navigator Documentation  Navigator Location: [facility] (08/02/17 1000)   )Navigator Encounter Type: Follow-up Appt (08/02/17 1000)  Chaperoned pelvic exam. Follow up entered for 4 months                    Patient Visit Type: GynOnc (08/02/17 1000)                              Time Spent with Patient: 15 (08/02/17 1000)

## 2017-08-02 NOTE — Progress Notes (Signed)
No gyn concerns today 

## 2017-08-07 ENCOUNTER — Telehealth: Payer: Self-pay

## 2017-08-08 NOTE — Telephone Encounter (Signed)
error 

## 2017-08-15 ENCOUNTER — Telehealth: Payer: Self-pay

## 2017-08-15 NOTE — Telephone Encounter (Signed)
Copied from [place] [id number]. Topic: Appointment Scheduling - Prior Auth Required for Appointment >> Aug 02, 2017 10:33 AM Ahmed Prima L wrote: Patient is requesting a change of an appointment for AW[facility]. She has it scheduled right now for 11/29/17. Can you forward this to the health coach and have her call her and reschedule this. Thanks Per scheduling protocol, this appointment requires a prior authorization prior to scheduling.  Route to department's PEC pool.

## 2017-08-16 ENCOUNTER — Telehealth: Payer: Self-pay

## 2017-08-16 ENCOUNTER — Other Ambulatory Visit: Payer: Self-pay

## 2017-08-16 MED ORDER — FLUTICASONE PROPIONATE 50 MCG/ACT NA SUSP: 1.0000 | Freq: Every day | NASAL | 1 refills | Status: DC

## 2017-08-16 NOTE — Telephone Encounter (Signed)
Copied from [place] [id number]. Topic: Appointment Scheduling - Prior Auth Required for Appointment >> Aug 02, 2017 10:33 AM Ahmed Prima L wrote: Patient is requesting a change of an appointment for AWV. She has it scheduled right now for 11/29/17. Can you forward this to the health coach and have her call her and reschedule this. Thanks Per scheduling protocol, this appointment requires a prior authorization prior to scheduling.  Route to department's PEC pool.

## 2017-08-16 NOTE — Telephone Encounter (Signed)
Appointment was rescheduled by care guide for 12/06/17.  Resolved.

## 2017-08-22 NOTE — Telephone Encounter (Signed)
Rescheduled to 12/12/17. Pt aware

## 2017-08-23 ENCOUNTER — Other Ambulatory Visit: Payer: Self-pay | Admitting: *Deleted

## 2017-08-23 MED ORDER — LETROZOLE 2.5 MG PO TABS: 2.5000 mg | ORAL_TABLET | Freq: Every day | ORAL | 1 refills | Status: DC

## 2017-08-24 ENCOUNTER — Encounter: Payer: Self-pay | Admitting: *Deleted

## 2017-08-25 ENCOUNTER — Ambulatory Visit: Payer: Medicare Other | Admitting: Anesthesiology

## 2017-08-25 ENCOUNTER — Encounter
Admission: RE | Disposition: A | Discharge status: Home / Self Care | Payer: Self-pay | Source: Ambulatory Visit | Attending: Gastroenterology

## 2017-08-25 ENCOUNTER — Encounter: Payer: Self-pay | Admitting: *Deleted

## 2017-08-25 ENCOUNTER — Ambulatory Visit
Admission: RE | Admit: 2017-08-25 | Discharge: 2017-08-25 | Disposition: A | Discharge status: Home / Self Care | Payer: Medicare Other | Source: Ambulatory Visit | Attending: Gastroenterology | Admitting: Gastroenterology

## 2017-08-25 DIAGNOSIS — Z79899 Other long term (current) drug therapy: Secondary | ICD-10-CM | POA: Diagnosis not present

## 2017-08-25 DIAGNOSIS — K210 Gastro-esophageal reflux disease with esophagitis: Secondary | ICD-10-CM | POA: Diagnosis not present

## 2017-08-25 DIAGNOSIS — I129 Hypertensive chronic kidney disease with stage 1 through stage 4 chronic kidney disease, or unspecified chronic kidney disease: Secondary | ICD-10-CM | POA: Diagnosis not present

## 2017-08-25 DIAGNOSIS — N189 Chronic kidney disease, unspecified: Secondary | ICD-10-CM | POA: Diagnosis not present

## 2017-08-25 DIAGNOSIS — E1122 Type 2 diabetes mellitus with diabetic chronic kidney disease: Secondary | ICD-10-CM | POA: Diagnosis not present

## 2017-08-25 DIAGNOSIS — F329 Major depressive disorder, single episode, unspecified: Secondary | ICD-10-CM | POA: Diagnosis not present

## 2017-08-25 DIAGNOSIS — E785 Hyperlipidemia, unspecified: Secondary | ICD-10-CM | POA: Insufficient documentation

## 2017-08-25 DIAGNOSIS — Z8542 Personal history of malignant neoplasm of other parts of uterus: Secondary | ICD-10-CM | POA: Insufficient documentation

## 2017-08-25 DIAGNOSIS — Z8719 Personal history of other diseases of the digestive system: Secondary | ICD-10-CM | POA: Diagnosis not present

## 2017-08-25 DIAGNOSIS — G4730 Sleep apnea, unspecified: Secondary | ICD-10-CM | POA: Insufficient documentation

## 2017-08-25 DIAGNOSIS — K449 Diaphragmatic hernia without obstruction or gangrene: Secondary | ICD-10-CM | POA: Insufficient documentation

## 2017-08-25 DIAGNOSIS — F419 Anxiety disorder, unspecified: Secondary | ICD-10-CM | POA: Insufficient documentation

## 2017-08-25 DIAGNOSIS — Z794 Long term (current) use of insulin: Secondary | ICD-10-CM | POA: Insufficient documentation

## 2017-08-25 DIAGNOSIS — K224 Dyskinesia of esophagus: Secondary | ICD-10-CM | POA: Diagnosis not present

## 2017-08-25 DIAGNOSIS — Z7982 Long term (current) use of aspirin: Secondary | ICD-10-CM | POA: Insufficient documentation

## 2017-08-25 DIAGNOSIS — K2270 Barrett's esophagus without dysplasia: Secondary | ICD-10-CM | POA: Diagnosis present

## 2017-08-25 HISTORY — PX: ESOPHAGOGASTRODUODENOSCOPY (EGD) WITH PROPOFOL: SHX5813

## 2017-08-25 LAB — GLUCOSE, CAPILLARY
Glucose-Capillary: 82 mg/dL (ref 65–99)
Glucose-Capillary: 71 mg/dL (ref 65–99)

## 2017-08-25 SURGERY — ESOPHAGOGASTRODUODENOSCOPY (EGD) WITH PROPOFOL
Anesthesia: General

## 2017-08-25 MED ORDER — PROPOFOL 500 MG/50ML IV EMUL
INTRAVENOUS | Status: AC
  Filled 2017-08-25: qty 50

## 2017-08-25 MED ORDER — SODIUM CHLORIDE 0.9 % IV SOLN: INTRAVENOUS | Status: DC

## 2017-08-25 MED ORDER — MIDAZOLAM HCL 2 MG/2ML IJ SOLN
INTRAMUSCULAR | Status: AC
  Filled 2017-08-25: qty 2

## 2017-08-25 MED ORDER — PROPOFOL 500 MG/50ML IV EMUL
INTRAVENOUS | Status: DC | PRN
  Administered 2017-08-25: 100 ug/kg/min via INTRAVENOUS

## 2017-08-25 MED ORDER — FENTANYL CITRATE (PF) 100 MCG/2ML IJ SOLN
INTRAMUSCULAR | Status: AC
  Filled 2017-08-25: qty 2

## 2017-08-25 MED ORDER — DEXTROSE IN LACTATED RINGERS 5 % IV SOLN
INTRAVENOUS | Status: DC | PRN
  Administered 2017-08-25: 08:00:00 via INTRAVENOUS

## 2017-08-25 MED ORDER — EPHEDRINE SULFATE 50 MG/ML IJ SOLN
INTRAMUSCULAR | Status: AC
  Filled 2017-08-25: qty 1

## 2017-08-25 MED ORDER — LIDOCAINE HCL (PF) 2 % IJ SOLN
INTRAMUSCULAR | Status: AC
  Filled 2017-08-25: qty 10

## 2017-08-25 MED ORDER — FENTANYL CITRATE (PF) 100 MCG/2ML IJ SOLN
INTRAMUSCULAR | Status: DC | PRN
  Administered 2017-08-25: 50 ug via INTRAVENOUS

## 2017-08-25 MED ORDER — BUTAMBEN-TETRACAINE-BENZOCAINE 2-2-14 % EX AERO
INHALATION_SPRAY | CUTANEOUS | Status: AC
  Filled 2017-08-25: qty 5

## 2017-08-25 MED ORDER — EPHEDRINE SULFATE 50 MG/ML IJ SOLN
INTRAMUSCULAR | Status: DC | PRN
  Administered 2017-08-25: 10 mg via INTRAVENOUS

## 2017-08-25 MED ORDER — MIDAZOLAM HCL 2 MG/2ML IJ SOLN
INTRAMUSCULAR | Status: DC | PRN
  Administered 2017-08-25: 1 mg via INTRAVENOUS

## 2017-08-25 MED ORDER — SODIUM CHLORIDE 0.9 % IV SOLN
INTRAVENOUS | Status: DC
  Administered 2017-08-25: 07:00:00 via INTRAVENOUS

## 2017-08-25 NOTE — Transfer of Care (Signed)
Immediate Anesthesia Transfer of Care Note  Patient: Sandra Hendrix  Procedure(s) Performed: ESOPHAGOGASTRODUODENOSCOPY (EGD) WITH PROPOFOL (N/A )  Patient Location: PACU  Anesthesia Type:General  Level of Consciousness: awake and sedated  Airway & Oxygen Therapy: Patient Spontanous Breathing and Patient connected to nasal cannula oxygen  Post-op Assessment: Report given to RN and Post -op Vital signs reviewed and stable  Post vital signs: Reviewed and stable  Last Vitals:  Vitals:   08/25/17 0659  BP: 139/67  Pulse: 70  Resp: 18  Temp: (!) 35.7 C  SpO2: 100%    Last Pain:  Vitals:   08/25/17 0659  TempSrc: Oral         Complications: No apparent anesthesia complications

## 2017-08-25 NOTE — Anesthesia Procedure Notes (Signed)
Performed by: Cook-Martin, Kaytee Taliercio Pre-anesthesia Checklist: Patient identified, Emergency Drugs available, Suction available, Patient being monitored and Timeout performed Patient Re-evaluated:Patient Re-evaluated prior to induction Oxygen Delivery Method: Simple face mask Preoxygenation: Pre-oxygenation with 100% oxygen Induction Type: IV induction Airway Equipment and Method: Bite block Placement Confirmation: CO2 detector and positive ETCO2       

## 2017-08-25 NOTE — Anesthesia Post-op Follow-up Note (Signed)
Anesthesia QCDR form completed.        

## 2017-08-25 NOTE — H&P (Signed)
Outpatient short stay form Pre-procedure 08/25/2017 7:53 AM Sandra Sails MD  Primary Physician: Dr Deborra Medina  Reason for visit:  EGD  History of present illness:  Patient is a 71 year old female with a known history of Barrett's esophagus. She had a EGD about a year ago to surveilled this however that time he experienced respiratory issues during the procedure which had to be abruptly terminated. Biopsies were not taken at that time. About 2 months ago she had a repeat check and once again had issues with this. With the patient, I have discussed this with anesthesia and the plan is to proceed today with monitored anesthesia and to switch to intubated procedure if needed. Patient does take a daily 81 mg aspirin but not today. She takes no other aspirin or blood thinning agents.  Patient does have a recent history about 4-6 months ago of diagnosis and treatment for uterine cancer without adjuvant chemotherapy, with adjuvant radiation treatment. She has done well from that.    Current Facility-Administered Medications:  .  0.9 %  sodium chloride infusion, , Intravenous, Continuous, Sandra Sails, MD, Last Rate: 20 mL/hr at 08/25/17 0720 .  0.9 %  sodium chloride infusion, , Intravenous, Continuous, Sandra Sails, MD .  butamben-tetracaine-benzocaine (CETACAINE) 10-28-12 % spray, , , ,   Medications Prior to Admission  Medication Sig Dispense Refill Last Dose  . acetaminophen (TYLENOL) 500 MG tablet Take 2 tablets (1,000 mg total) by mouth every 6 (six) hours. (Patient taking differently: Take 1,000 mg every 6 (six) hours as needed by mouth. ) 65 tablet 1 Past Week at Unknown time  . Ascorbic Acid (VITAMIN C) 1000 MG tablet Take 1,000 mg by mouth every evening.    08/24/2017 at Unknown time  . aspirin EC 81 MG tablet Take 81 mg by mouth every evening.    08/24/2017 at Unknown time  . atorvastatin (LIPITOR) 20 MG tablet Take 20 mg by mouth at bedtime.    08/24/2017 at Unknown time   . azelastine (ASTELIN) 0.1 % nasal spray Place 1 spray into both nostrils daily.   08/24/2017 at Unknown time  . Biotin 1000 MCG tablet Take 1,000 mcg by mouth 2 (two) times daily.   08/24/2017 at Unknown time  . Calcium Carbonate-Vitamin D 300-100 MG-UNIT CAPS Take 600 mg at bedtime by mouth.    08/24/2017 at Unknown time  . Cholecalciferol (D3-1000 PO) Take 1 tablet by mouth daily.    08/24/2017 at Unknown time  . CINNAMON PO Take 1,000 mg by mouth 2 (two) times daily.   08/24/2017 at Unknown time  . docusate sodium (COLACE) 100 MG capsule Take 1 capsule (100 mg total) by mouth 2 (two) times daily. (Patient taking differently: Take 100 mg daily by mouth. ) 20 capsule 0 08/24/2017 at Unknown time  . fish oil-omega-3 fatty acids 1000 MG capsule Take 1 g by mouth every evening.    08/24/2017 at Unknown time  . fluticasone (FLONASE) 50 MCG/ACT nasal spray Place 1 spray into both nostrils daily. 48 g 1 08/24/2017 at Unknown time  . furosemide (LASIX) 20 MG tablet TAKE 1 TABLET BY MOUTH  EVERY OTHER DAY 45 tablet 3 08/24/2017 at Unknown time  . glipiZIDE (GLUCOTROL XL) 5 MG 24 hr tablet Take 1 tablet (5 mg total) by mouth daily. 90 tablet 1 08/24/2017 at Unknown time  . insulin detemir (LEVEMIR) 100 UNIT/ML injection Inject 0.15 mLs (15 Units total) into the skin at bedtime. (Patient taking differently: Inject  20 Units into the skin at bedtime. ) 10 mL 11 08/24/2017 at Unknown time  . Lancet Devices (ACCU-CHEK SOFTCLIX) lancets Use to test blood sugars 1 -2 times daily 1 each 3 08/25/2017 at Unknown time  . letrozole (FEMARA) 2.5 MG tablet Take 1 tablet (2.5 mg total) by mouth daily. 30 tablet 1 08/24/2017 at Unknown time  . loratadine (CLARITIN) 10 MG tablet Take 10 mg by mouth daily.   08/24/2017 at Unknown time  . losartan-hydrochlorothiazide (HYZAAR) 50-12.5 MG tablet TAKE 1 TABLET BY MOUTH  DAILY 90 tablet 1 08/25/2017 at Unknown time  . medroxyPROGESTERone (PROVERA) 10 MG tablet Take by mouth.    08/24/2017 at Unknown time  . metFORMIN (GLUCOPHAGE-XR) 750 MG 24 hr tablet TAKE 2 TABLETS BY MOUTH  DAILY WITH BREAKFAST 180 tablet 1 08/24/2017 at Unknown time  . Multiple Vitamin (MULTIVITAMIN) tablet Take 1 tablet by mouth daily.   08/24/2017 at Unknown time  . omeprazole (PRILOSEC) 20 MG capsule Take 20 mg by mouth daily.   08/24/2017 at Unknown time  . Probiotic Product (PROBIOTIC-10) CAPS Take 1 capsule by mouth daily.    08/24/2017 at Unknown time  . Propylene Glycol (SYSTANE BALANCE OP) Apply 2 drops daily to eye. Both eyes   08/24/2017 at Unknown time  . sennosides-docusate sodium (SENOKOT-S) 8.6-50 MG tablet Take 1 tablet by mouth daily.   08/24/2017 at Unknown time  . sertraline (ZOLOFT) 50 MG tablet TAKE 1 TABLET BY MOUTH  DAILY 90 tablet 1 08/24/2017 at Unknown time  . sitaGLIPtin (JANUVIA) 100 MG tablet Take 1 tablet (100 mg total) by mouth daily. 90 tablet 3 08/24/2017 at Unknown time  . verapamil (VERELAN PM) 180 MG 24 hr capsule TAKE 1 CAPSULE BY MOUTH  DAILY 90 capsule 1 08/24/2017 at Unknown time  . ACCU-CHEK AVIVA PLUS test strip TEST ONCE DAILY 100 each 1 Taking  . albuterol (PROVENTIL HFA;VENTOLIN HFA) 108 (90 BASE) MCG/ACT inhaler Inhale 2 puffs into the lungs every 6 (six) hours as needed for wheezing or shortness of breath.    Not Taking at Unknown time  . magnesium oxide (MAG-OX) 400 MG tablet Take 400 mg by mouth daily.   Not Taking at Unknown time  . meclizine (ANTIVERT) 25 MG tablet Take 25 mg by mouth 2 (two) times daily as needed for dizziness.   Not Taking at Unknown time  . pantoprazole (PROTONIX) 40 MG tablet Take by mouth.   Not Taking at Unknown time     Allergies  Allergen Reactions  . Clindamycin/Lincomycin Rash  . Codeine Rash  . Morphine And Related Nausea Only  . Talwin [Pentazocine] Nausea And Vomiting  . Lisinopril Cough    cough  . Naloxone Other (See Comments)    Had issues with sedation and vomiting with EGD  . Oxycodone-Acetaminophen Other  (See Comments)    Pt states this medication makes her bowels stop moving     Past Medical History:  Diagnosis Date  . Allergy   . Anxiety   . Arthritis   . Barrett's esophagus determined by endoscopy   . [facility])    Uterine Cancer  . Chronic kidney disease   . Depression   . Diabetes mellitus without complication ([place])   . Difficult intubation    "not sure what happened,asleep was told had problem with intubation"  . Diverticulosis   . GERD (gastroesophageal reflux disease)   . History of bronchitis   . History of hiatal hernia   . Hyperlipidemia   .  Hypertension   . Sleep apnea    Use C- PAP  . Urinary incontinence   . Varicose veins     Review of systems:      Physical Exam    Heart and lungs: Regular rate and rhythm without rub or gallop, lungs are bilaterally clear.    HEENT: Normocephalic atraumatic eyes are anicteric    Other:     Pertinant exam for procedure: Soft nontender nondistended bowel sounds positive normoactive.    Planned proceedures: EGD and indicated procedures. I have discussed the risks benefits and complications of procedures to include not limited to bleeding, infection, perforation and the risk of sedation and the patient wishes to proceed.    Sandra Sails, MD Gastroenterology 08/25/2017  7:53 AM

## 2017-08-25 NOTE — Anesthesia Postprocedure Evaluation (Signed)
Anesthesia Post Note  Patient: Sandra Hendrix  Procedure(s) Performed: ESOPHAGOGASTRODUODENOSCOPY (EGD) WITH PROPOFOL (N/A )  Patient location during evaluation: Endoscopy Anesthesia Type: General Level of consciousness: awake and alert Pain management: pain level controlled Vital Signs Assessment: post-procedure vital signs reviewed and stable Respiratory status: spontaneous breathing and respiratory function stable Cardiovascular status: stable Anesthetic complications: no     Last Vitals:  Vitals:   08/25/17 0840 08/25/17 0846  BP:  106/67  Pulse: 77 73  Resp: 18 16  Temp:    SpO2: 98% 98%    Last Pain:  Vitals:   08/25/17 0824  TempSrc: Oral                 KEPHART,WILLIAM K

## 2017-08-25 NOTE — Anesthesia Preprocedure Evaluation (Signed)
Anesthesia Evaluation  Patient identified by MRN, date of birth, ID band Patient awake    Reviewed: Allergy & Precautions, NPO status , Patient's Chart, lab work & pertinent test results  History of Anesthesia Complications (+) DIFFICULT AIRWAY  Airway Mallampati: III       Dental   Pulmonary sleep apnea and Continuous Positive Airway Pressure Ventilation , neg COPD,           Cardiovascular hypertension, Pt. on medications + Peripheral Vascular Disease  (-) Past MI and (-) CHF (-) dysrhythmias (-) Valvular Problems/Murmurs     Neuro/Psych neg Seizures Anxiety Depression    GI/Hepatic Neg liver ROS, hiatal hernia, GERD  Medicated and Controlled,  Endo/Other  diabetes, Type 2, Oral Hypoglycemic Agents, Insulin Dependent  Renal/GU Renal InsufficiencyRenal disease     Musculoskeletal   Abdominal   Peds  Hematology   Anesthesia Other Findings   Reproductive/Obstetrics                             Anesthesia Physical Anesthesia Plan  ASA: III  Anesthesia Plan: General   Post-op Pain Management:    Induction:   PONV Risk Score and Plan: 3 and TIVA, Propofol infusion, Midazolam and Treatment may vary due to age or medical condition  Airway Management Planned: Nasal Cannula  Additional Equipment:   Intra-op Plan:   Post-operative Plan:   Informed Consent: I have reviewed the patients History and Physical, chart, labs and discussed the procedure including the risks, benefits and alternatives for the proposed anesthesia with the patient or authorized representative who has indicated his/her understanding and acceptance.     Plan Discussed with:   Anesthesia Plan Comments:         Anesthesia Quick Evaluation

## 2017-08-25 NOTE — Op Note (Signed)
[facility] Gastroenterology Patient Name: Sandra Hendrix Procedure Date: 08/25/2017 7:32 AM MRN: [id number] Account #: [id number] Date of Birth: 1946/08/10 Admit Type: Outpatient Age: 71 Room: [facility] ENDO ROOM 1 Gender: Female Note Status: Finalized Procedure:            Upper GI endoscopy Indications:          Surveillance for malignancy due to personal history of                        Barrett's esophagus Providers:            Lollie Sails, MD Referring MD:         Deborra Medina, MD (Referring MD) Medicines:            Monitored Anesthesia Care Complications:        No immediate complications. Procedure:            Pre-Anesthesia Assessment:                       - ASA Grade Assessment: III - A patient with severe                        systemic disease.                       After obtaining informed consent, the endoscope was                        passed under direct vision. Throughout the procedure,                        the patient's blood pressure, pulse, and oxygen                        saturations were monitored continuously. The Endoscope                        was introduced through the mouth, and advanced to the                        third part of duodenum. The patient tolerated the                        procedure well. The upper GI endoscopy was accomplished                        without difficulty. Findings:      Abnormal motility was noted in the middle third of the esophagus and in       the lower third of the esophagus. The cricopharyngeus was normal. There       are extra peristaltic waves in the esophageal body. Tertiary peristaltic       waves are noted.      The Z-line was variable. Mucosa was biopsied with a cold forceps for       histology in 4 quadrants at the gastroesophageal junction. One specimen       bottle was sent to pathology. A small amount of heme is noted at the GE       junction with no obvious source.      The  exam of the esophagus was otherwise  normal.      A small to medium sized hiatal hernia was present.      The exam of the stomach was otherwise normal.      The in the duodenum was normal. Impression:           - Abnormal esophageal motility.                       - Z-line variable. Biopsied.                       - Small to medium sized hiatal hernia.                       - Normal duodenum. Recommendation:       - Await pathology results.                       - Continue present medications.                       - Telephone GI clinic for pathology results in 1 week. Procedure Code(s):    --- Professional ---                       [phone], Esophagogastroduodenoscopy, flexible, transoral;                        with biopsy, single or multiple Diagnosis Code(s):    --- Professional ---                       K22.4, Dyskinesia of esophagus                       K22.8, Other specified diseases of esophagus                       K44.9, Diaphragmatic hernia without obstruction or                        gangrene                       K22.70, Barrett's esophagus without dysplasia CPT copyright 2016 [employer]. All rights reserved. The codes documented in this report are preliminary and upon coder review may  be revised to meet current compliance requirements. Lollie Sails, MD 08/25/2017 8:22:59 AM This report has been signed electronically. Number of Addenda: 0 Note Initiated On: 08/25/2017 7:32 AM      [facility]

## 2017-08-28 ENCOUNTER — Encounter: Payer: Self-pay | Admitting: Gastroenterology

## 2017-09-05 ENCOUNTER — Other Ambulatory Visit: Payer: Self-pay | Admitting: Internal Medicine

## 2017-09-07 LAB — SURGICAL PATHOLOGY

## 2017-09-18 ENCOUNTER — Ambulatory Visit: Payer: Medicare Other | Admitting: Internal Medicine

## 2017-09-20 ENCOUNTER — Other Ambulatory Visit: Payer: Self-pay | Admitting: Internal Medicine

## 2017-09-27 ENCOUNTER — Encounter: Payer: Self-pay | Admitting: Internal Medicine

## 2017-09-27 ENCOUNTER — Ambulatory Visit: Payer: Medicare Other | Admitting: Internal Medicine

## 2017-09-27 VITALS — BP 118/72 | HR 68 | Temp 97.7°F | Resp 15 | Ht 61.0 in | Wt 180.0 lb

## 2017-09-27 DIAGNOSIS — K2270 Barrett's esophagus without dysplasia: Secondary | ICD-10-CM

## 2017-09-27 DIAGNOSIS — G5601 Carpal tunnel syndrome, right upper limb: Secondary | ICD-10-CM | POA: Diagnosis not present

## 2017-09-27 DIAGNOSIS — Z794 Long term (current) use of insulin: Secondary | ICD-10-CM | POA: Diagnosis not present

## 2017-09-27 DIAGNOSIS — E1121 Type 2 diabetes mellitus with diabetic nephropathy: Secondary | ICD-10-CM

## 2017-09-27 DIAGNOSIS — N393 Stress incontinence (female) (male): Secondary | ICD-10-CM

## 2017-09-27 DIAGNOSIS — Z9989 Dependence on other enabling machines and devices: Secondary | ICD-10-CM | POA: Diagnosis not present

## 2017-09-27 DIAGNOSIS — G4733 Obstructive sleep apnea (adult) (pediatric): Secondary | ICD-10-CM

## 2017-09-27 DIAGNOSIS — I10 Essential (primary) hypertension: Secondary | ICD-10-CM

## 2017-09-27 DIAGNOSIS — Z79899 Other long term (current) drug therapy: Secondary | ICD-10-CM | POA: Diagnosis not present

## 2017-09-27 LAB — COMPREHENSIVE METABOLIC PANEL
ALT: 16 U/L (ref 0–35)
AST: 19 U/L (ref 0–37)
Albumin: 4.2 g/dL (ref 3.5–5.2)
Alkaline Phosphatase: 84 U/L (ref 39–117)
BUN: 25 mg/dL — ABNORMAL HIGH (ref 6–23)
CO2: 30 mEq/L (ref 19–32)
Calcium: 9.3 mg/dL (ref 8.4–10.5)
Chloride: 100 mEq/L (ref 96–112)
Creatinine, Ser: 1.05 mg/dL (ref 0.40–1.20)
GFR: 54.53 mL/min — ABNORMAL LOW (ref >60.00)
Glucose, Bld: 104 mg/dL — ABNORMAL HIGH (ref 70–99)
Potassium: 3.8 mEq/L (ref 3.5–5.1)
Sodium: 141 mEq/L (ref 135–145)
Total Bilirubin: 0.4 mg/dL (ref 0.2–1.2)
Total Protein: 7.2 g/dL (ref 6.0–8.3)

## 2017-09-27 LAB — LIPID PANEL
Cholesterol: 175 mg/dL (ref 0–200)
HDL: 66.9 mg/dL (ref >39.00)
LDL Cholesterol: 86 mg/dL (ref 0–99)
NonHDL: 108.03
Total CHOL/HDL Ratio: 3
Triglycerides: 111 mg/dL (ref 0.0–149.0)
VLDL: 22.2 mg/dL (ref 0.0–40.0)

## 2017-09-27 LAB — MICROALBUMIN / CREATININE URINE RATIO
Creatinine,U: 103.2 mg/dL
Microalb Creat Ratio: 1 mg/g (ref 0.0–30.0)
Microalb, Ur: 1 mg/dL (ref 0.0–1.9)

## 2017-09-27 LAB — VITAMIN D 25 HYDROXY (VIT D DEFICIENCY, FRACTURES): VITD: 47.58 ng/mL (ref 30.00–100.00)

## 2017-09-27 LAB — HEMOGLOBIN A1C: Hgb A1c MFr Bld: 6.6 % — ABNORMAL HIGH (ref 4.6–6.5)

## 2017-09-27 LAB — VITAMIN B12: Vitamin B-12: 317 pg/mL (ref 211–911)

## 2017-09-27 MED ORDER — PANTOPRAZOLE SODIUM 40 MG PO TBEC: 40.0000 mg | DELAYED_RELEASE_TABLET | Freq: Every day | ORAL | 2 refills | Status: DC

## 2017-09-27 MED ORDER — ZOSTER VAC RECOMB ADJUVANTED 50 MCG/0.5ML IM SUSR: 0.5000 mL | Freq: Once | INTRAMUSCULAR | 1 refills | Status: AC

## 2017-09-27 NOTE — Progress Notes (Signed)
Subjective:  Patient ID: Lenna Sciara, female    DOB: 06/02/1946  Age: 72 y.o. MRN: [id number]  CC: The primary encounter diagnosis was Type 2 diabetes mellitus with diabetic nephropathy, with long-term current use of insulin ([place]). Diagnoses of Long-term use of high-risk medication, Stress incontinence of urine, Barrett's esophagus without dysplasia, OSA on CPAP, Essential hypertension, and Carpal tunnel syndrome on right were also pertinent to this visit.  HPI Sophronia P Blankenbaker presents for 3 month follow up on diabetes.  Patient has no complaints today.  Patient is following a low glycemic index diet and taking all prescribed medications regularly without side effects.  Fasting sugars have been under less than 140 most of the time and post prandials have been under 180 except on rare occasions. Patient is exercising about 3 times per week and intentionally trying to lose weight .  Patient has had an eye exam in the last 12 months and checks feet regularly for signs of infection.  Patient does not walk barefoot outside,  And denies an numbness tingling or burning in feet. Patient is up to date on all recommended vaccinations  Lab Results  Component Value Date   HGBA1C 6.6 (H) 09/27/2017   EGD done in September 2017 by Gustavo Lah, desaturations ended procedure early.  Has OSA , diagnosed in  January  By sleep study She is wearing her CPAP  An average of 5.5 hours per night .   EGD was repeated in late Nov 2018 for Barrett's : no dysplasia or malignancy  Noted   Endometrial cancer : diagnosed in May 2018, s/p TAH/BSO June 2018 followed by  XRT. Now on letrozole   Foot exam normal   Outpatient Medications Prior to Visit  Medication Sig Dispense Refill  . ACCU-CHEK AVIVA PLUS test strip TEST TWO TIMES DAILY 200 each 1  . acetaminophen (TYLENOL) 500 MG tablet Take 2 tablets (1,000 mg total) by mouth every 6 (six) hours. (Patient taking differently: Take 1,000 mg every 6 (six) hours as needed  by mouth. ) 65 tablet 1  . albuterol (PROVENTIL HFA;VENTOLIN HFA) 108 (90 BASE) MCG/ACT inhaler Inhale 2 puffs into the lungs every 6 (six) hours as needed for wheezing or shortness of breath.     . Ascorbic Acid (VITAMIN C) 1000 MG tablet Take 1,000 mg by mouth every evening.     Marland Kitchen aspirin EC 81 MG tablet Take 81 mg by mouth every evening.     Marland Kitchen atorvastatin (LIPITOR) 20 MG tablet Take 20 mg by mouth at bedtime.     Marland Kitchen azelastine (ASTELIN) 0.1 % nasal spray Place 1 spray into both nostrils daily.    . Biotin 1000 MCG tablet Take 1,000 mcg by mouth 2 (two) times daily.    . Calcium Carbonate-Vitamin D 300-100 MG-UNIT CAPS Take 600 mg at bedtime by mouth.     . Cholecalciferol (D3-1000 PO) Take 1 tablet by mouth daily.     Marland Kitchen CINNAMON PO Take 1,000 mg by mouth 2 (two) times daily.    Marland Kitchen docusate sodium (COLACE) 100 MG capsule Take 1 capsule (100 mg total) by mouth 2 (two) times daily. (Patient taking differently: Take 100 mg daily by mouth. ) 20 capsule 0  . fish oil-omega-3 fatty acids 1000 MG capsule Take 1 g by mouth every evening.     . fluticasone (FLONASE) 50 MCG/ACT nasal spray Place 1 spray into both nostrils daily. 48 g 1  . furosemide (LASIX) 20 MG tablet TAKE 1 TABLET  BY MOUTH  EVERY OTHER DAY 45 tablet 3  . glipiZIDE (GLUCOTROL XL) 5 MG 24 hr tablet TAKE 1 TABLET BY MOUTH  DAILY 90 tablet 1  . insulin detemir (LEVEMIR) 100 UNIT/ML injection Inject 0.15 mLs (15 Units total) into the skin at bedtime. (Patient taking differently: Inject 20 Units into the skin at bedtime. ) 10 mL 11  . Lancet Devices (ACCU-CHEK SOFTCLIX) lancets Use to test blood sugars 1 -2 times daily 1 each 3  . letrozole (FEMARA) 2.5 MG tablet Take 1 tablet (2.5 mg total) by mouth daily. 30 tablet 1  . loratadine (CLARITIN) 10 MG tablet Take 10 mg by mouth daily.    Marland Kitchen losartan-hydrochlorothiazide (HYZAAR) 50-12.5 MG tablet TAKE 1 TABLET BY MOUTH  DAILY 90 tablet 1  . magnesium oxide (MAG-OX) 400 MG tablet Take 400 mg by  mouth daily.    . meclizine (ANTIVERT) 25 MG tablet Take 25 mg by mouth 2 (two) times daily as needed for dizziness.    . metFORMIN (GLUCOPHAGE-XR) 750 MG 24 hr tablet TAKE 2 TABLETS BY MOUTH  DAILY WITH BREAKFAST 180 tablet 1  . Multiple Vitamin (MULTIVITAMIN) tablet Take 1 tablet by mouth daily.    . Probiotic Product (PROBIOTIC-10) CAPS Take 1 capsule by mouth daily.     Marland Kitchen Propylene Glycol (SYSTANE BALANCE OP) Apply 2 drops daily to eye. Both eyes    . sennosides-docusate sodium (SENOKOT-S) 8.6-50 MG tablet Take 1 tablet by mouth daily.    . sertraline (ZOLOFT) 50 MG tablet TAKE 1 TABLET BY MOUTH  DAILY 90 tablet 1  . sitaGLIPtin (JANUVIA) 100 MG tablet Take 1 tablet (100 mg total) by mouth daily. 90 tablet 3  . verapamil (VERELAN PM) 180 MG 24 hr capsule TAKE 1 CAPSULE BY MOUTH  DAILY 90 capsule 1  . pantoprazole (PROTONIX) 40 MG tablet Take by mouth.    . medroxyPROGESTERone (PROVERA) 10 MG tablet Take by mouth.    Marland Kitchen omeprazole (PRILOSEC) 20 MG capsule Take 20 mg by mouth daily.     No facility-administered medications prior to visit.     Review of Systems;  Patient denies headache, fevers, malaise, unintentional weight loss, skin rash, eye pain, sinus congestion and sinus pain, sore throat, dysphagia,  hemoptysis , cough, dyspnea, wheezing, chest pain, palpitations, orthopnea, edema, abdominal pain, nausea, melena, diarrhea, constipation, flank pain, dysuria, hematuria, urinary  Frequency, nocturia, numbness, tingling, seizures,  Focal weakness, Loss of consciousness,  Tremor, insomnia, depression, anxiety, and suicidal ideation.      Objective:  BP 118/72 (BP Location: Left Arm, Patient Position: Sitting, Cuff Size: Normal)   Pulse 68   Temp 97.7 F (36.5 C) (Oral)   Resp 15   Ht 5\' 1"  (1.549 m)   Wt 180 lb (81.6 kg)   SpO2 96%   BMI 34.01 kg/m   BP Readings from Last 3 Encounters:  09/27/17 118/72  08/25/17 114/73  08/02/17 131/80    Wt Readings from Last 3  Encounters:  09/27/17 180 lb (81.6 kg)  08/25/17 178 lb (80.7 kg)  08/02/17 179 lb 8 oz (81.4 kg)    General appearance: alert, cooperative and appears stated age Ears: normal TM's and external ear canals both ears Throat: lips, mucosa, and tongue normal; teeth and gums normal Neck: no adenopathy, no carotid bruit, supple, symmetrical, trachea midline and thyroid not enlarged, symmetric, no tenderness/mass/nodules Back: symmetric, no curvature. ROM normal. No CVA tenderness. Lungs: clear to auscultation bilaterally Heart: regular rate and rhythm, S1,  S2 normal, no murmur, click, rub or gallop Abdomen: soft, non-tender; bowel sounds normal; no masses,  no organomegaly Pulses: 2+ and symmetric Skin: Skin color, texture, turgor normal. No rashes or lesions Lymph nodes: Cervical, supraclavicular, and axillary nodes normal.  Lab Results  Component Value Date   HGBA1C 6.6 (H) 09/27/2017   HGBA1C 6.3 06/19/2017   HGBA1C 7.8 (H) 02/22/2017    Lab Results  Component Value Date   CREATININE 1.05 09/27/2017   CREATININE 1.05 (H) 02/27/2017   CREATININE 1.04 02/22/2017    Lab Results  Component Value Date   WBC 4.2 05/15/2017   HGB 12.0 05/15/2017   HCT 34.5 (L) 05/15/2017   PLT 316 05/15/2017   GLUCOSE 104 (H) 09/27/2017   CHOL 175 09/27/2017   TRIG 111.0 09/27/2017   HDL 66.90 09/27/2017   LDLDIRECT 80.0 02/22/2017   LDLCALC 86 09/27/2017   ALT 16 09/27/2017   AST 19 09/27/2017   NA 141 09/27/2017   K 3.8 09/27/2017   CL 100 09/27/2017   CREATININE 1.05 09/27/2017   BUN 25 (H) 09/27/2017   CO2 30 09/27/2017   TSH 2.69 11/07/2014   INR 1.1 01/23/2009   HGBA1C 6.6 (H) 09/27/2017   MICROALBUR 1.0 09/27/2017    No results found.  Assessment & Plan:   Problem List Items Addressed This Visit    Barrett's esophagus    Repeat EGD Nov 2018 was negative for dysplasia and malignant cell.s  Continue PPI      Carpal tunnel syndrome on right    Confirmed with EMG/[place]  studies in Nov 2018 Manuella Ghazi).  [place] eof Splint (nocturnal) recommended      DM (diabetes mellitus), type 2 with renal complications (HCC) - Primary (Chronic)    Currently well-controlled on current medications .  hemoglobin A1c is at goal of less than 7.0 . Patient is reminded to schedule an annual eye exam and foot exam is normal today. Patient has no microalbuminuria. Patient is tolerating statin therapy for CAD risk reduction and on ACE/ARB for renal protection and hypertension   Lab Results  Component Value Date   HGBA1C 6.6 (H) 09/27/2017   Lab Results  Component Value Date   MICROALBUR 1.0 09/27/2017   Lab Results  Component Value Date   CREATININE 1.05 09/27/2017          Relevant Orders   Hemoglobin A1c (Completed)   Comprehensive metabolic panel (Completed)   Lipid panel (Completed)   Microalbumin / creatinine urine ratio (Completed)   Essential hypertension    Well controlled on current regimen. Renal function stable, no changes today.  Lab Results  Component Value Date   CREATININE 1.05 09/27/2017   Lab Results  Component Value Date   NA 141 09/27/2017   K 3.8 09/27/2017   CL 100 09/27/2017   CO2 30 09/27/2017         OSA on CPAP    Diagnosed by sleep study. She is wearing her CPAP every night a minimum of 5.5 hours per night and notes improved daytime wakefulness and decreased fatigue       Urinary incontinence    Secondary to TAH/BSO.  She has not seen urogyn yet.       Other Visit Diagnoses    Long-term use of high-risk medication       Relevant Orders   VITAMIN D 25 Hydroxy (Vit-D Deficiency, Fractures) (Completed)   Vitamin B12 (Completed)     A total of 40 minutes was spent with  patient more than half of which was spent in counseling patient on the above mentioned issues , reviewing and explaining recent labs and imaging studies done, and coordination of care.  I have discontinued Brookelynn P. Hermans's medroxyPROGESTERone and omeprazole. I  have also changed her pantoprazole. Additionally, I am having her start on Zoster Vaccine Adjuvanted. Lastly, I am having her maintain her Cholecalciferol (D3-1000 PO), multivitamin, fish oil-omega-3 fatty acids, albuterol, aspirin EC, PROBIOTIC-10, vitamin C, CINNAMON PO, loratadine, atorvastatin, azelastine, Biotin, insulin detemir, sitaGLIPtin, acetaminophen, docusate sodium, meclizine, accu-chek softclix, Calcium Carbonate-Vitamin D, magnesium oxide, sennosides-docusate sodium, furosemide, Propylene Glycol (SYSTANE BALANCE OP), fluticasone, letrozole, verapamil, metFORMIN, sertraline, losartan-hydrochlorothiazide, glipiZIDE, and ACCU-CHEK AVIVA PLUS.  Meds ordered this encounter  Medications  . pantoprazole (PROTONIX) 40 MG tablet    Sig: Take 1 tablet (40 mg total) by mouth daily.    Dispense:  90 tablet    Refill:  2  . Zoster Vaccine Adjuvanted [facility]) injection    Sig: Inject 0.5 mLs into the muscle once for 1 dose.    Dispense:  1 each    Refill:  1    Medications Discontinued During This Encounter  Medication Reason  . medroxyPROGESTERone (PROVERA) 10 MG tablet Patient has not taken in last 30 days  . omeprazole (PRILOSEC) 20 MG capsule Patient has not taken in last 30 days  . pantoprazole (PROTONIX) 40 MG tablet Reorder    Follow-up: No Follow-up on file.   Crecencio Mc, MD

## 2017-09-27 NOTE — Patient Instructions (Addendum)
Check with your pharmacist about the losartan you have been taking.  It may have been recalled  contineuetaking protonix to prevent Barrett's Esophagus from returning   Your ultimate goal weight to get your  BMI < 30 is 158 lbs   22 lbs over 12 months is 2 lbs/month. 30 minutes of dancing,  Walking,   Rowing,  Etc 5 days per week   Limit your starches to  2 servings per day

## 2017-09-28 ENCOUNTER — Encounter: Payer: Self-pay | Admitting: Internal Medicine

## 2017-09-28 NOTE — Assessment & Plan Note (Signed)
Confirmed with EMG/[place] studies in Nov 2018 Sandra Hendrix).  [place] eof Splint (nocturnal) recommended

## 2017-09-28 NOTE — Assessment & Plan Note (Signed)
Well controlled on current regimen. Renal function stable, no changes today.  Lab Results  Component Value Date   CREATININE 1.05 09/27/2017   Lab Results  Component Value Date   NA 141 09/27/2017   K 3.8 09/27/2017   CL 100 09/27/2017   CO2 30 09/27/2017

## 2017-09-28 NOTE — Assessment & Plan Note (Signed)
Repeat EGD Nov 2018 was negative for dysplasia and malignant cell.s  Continue PPI

## 2017-09-28 NOTE — Assessment & Plan Note (Signed)
Secondary to TAH/BSO.  She has not seen urogyn yet.

## 2017-09-28 NOTE — Assessment & Plan Note (Signed)
Currently well-controlled on current medications .  hemoglobin A1c is at goal of less than 7.0 . Patient is reminded to schedule an annual eye exam and foot exam is normal today. Patient has no microalbuminuria. Patient is tolerating statin therapy for CAD risk reduction and on ACE/ARB for renal protection and hypertension   Lab Results  Component Value Date   HGBA1C 6.6 (H) 09/27/2017   Lab Results  Component Value Date   MICROALBUR 1.0 09/27/2017   Lab Results  Component Value Date   CREATININE 1.05 09/27/2017

## 2017-09-28 NOTE — Assessment & Plan Note (Signed)
Diagnosed by sleep study. She is wearing her CPAP every night a minimum of 5.5 hours per night and notes improved daytime wakefulness and decreased fatigue

## 2017-10-19 ENCOUNTER — Other Ambulatory Visit: Payer: Self-pay | Admitting: Internal Medicine

## 2017-10-20 ENCOUNTER — Other Ambulatory Visit: Payer: Self-pay | Admitting: *Deleted

## 2017-10-20 MED ORDER — LETROZOLE 2.5 MG PO TABS: 2.5000 mg | ORAL_TABLET | Freq: Every day | ORAL | 3 refills | Status: DC

## 2017-10-20 NOTE — Telephone Encounter (Signed)
Refilled: 09/06/2017 Last OV: 09/27/2017 Next OV: 03/27/2018

## 2017-10-26 ENCOUNTER — Ambulatory Visit: Payer: Medicare Other | Admitting: Family Medicine

## 2017-10-26 ENCOUNTER — Encounter: Payer: Self-pay | Admitting: Family Medicine

## 2017-10-26 VITALS — BP 126/78 | HR 78 | Temp 98.0°F | Wt 180.0 lb

## 2017-10-26 DIAGNOSIS — B309 Viral conjunctivitis, unspecified: Secondary | ICD-10-CM | POA: Diagnosis not present

## 2017-10-26 NOTE — Progress Notes (Signed)
Subjective:    Patient ID: Sandra Hendrix, female    DOB: [birth year], 72 y.o.   MRN: [id number]  HPI  Sandra Hendrix is a 72 year old female who presents today with right eye redness and clear drainage for 3 days.  She reports drainage has been present consistently but seems to be improving today.  Erythema: Mild Discharge: clear discharge Crusting: Yes, described as "sticky and itchy" Discomfort: No Recent sick contact exposure: Husband ill (recently treated for pneumonia) and she has been experiencing URI symptoms that have been treated with antibiotic therapy and have improved Contact Lens use: No Foreign body: No  She denies any visual changes such as blurry vision or decreased vision   Review of Systems  Constitutional: Negative for chills, fatigue and fever.  HENT: Positive for postnasal drip and rhinorrhea. Negative for sinus pressure, sinus pain, sneezing and sore throat.   Eyes: Positive for discharge, redness and itching. Negative for photophobia, pain and visual disturbance.  Respiratory: Negative for cough, shortness of breath and wheezing.   Cardiovascular: Negative for chest pain and palpitations.  Gastrointestinal: Negative for abdominal pain, constipation, diarrhea, nausea and vomiting.  Neurological: Negative for dizziness and headaches.   Past Medical History:  Diagnosis Date  . Allergy   . Anxiety   . Arthritis   . Barrett's esophagus determined by endoscopy   . Cancer [facility])    Uterine Cancer  . Chronic kidney disease   . Depression   . Diabetes mellitus without complication ([place])   . Difficult intubation    "not sure what happened,asleep was told had problem with intubation"  . Diverticulosis   . GERD (gastroesophageal reflux disease)   . History of bronchitis   . History of hiatal hernia   . Hyperlipidemia   . Hypertension   . Sleep apnea    Use C- PAP  . Urinary incontinence   . Varicose veins      Social History   Socioeconomic History  .  Marital status: Married    Spouse name: Not on file  . Number of children: 2  . Years of education: 14  . Highest education level: Some college, no degree  Social Needs  . Financial resource strain: Not on file  . Food insecurity - worry: Never true  . Food insecurity - inability: Never true  . Transportation needs - medical: No  . Transportation needs - non-medical: No  Occupational History  . Not on file  Tobacco Use  . Smoking status: Never Smoker  . Smokeless tobacco: Never Used  Substance and Sexual Activity  . Alcohol use: No  . Drug use: No  . Sexual activity: Yes    Comment: not often  Other Topics Concern  . Not on file  Social History Narrative  . Not on file    Past Surgical History:  Procedure Laterality Date  . CHOLECYSTECTOMY    . COLONOSCOPY WITH PROPOFOL N/A 06/14/2016   Procedure: COLONOSCOPY WITH PROPOFOL;  Surgeon: Lollie Sails, MD;  Location[facility] ENDOSCOPY;  Service: Endoscopy;  Laterality: N/A;  . ESOPHAGOGASTRODUODENOSCOPY (EGD) WITH PROPOFOL N/A 06/14/2016   Procedure: ESOPHAGOGASTRODUODENOSCOPY (EGD) WITH PROPOFOL;  Surgeon: Lollie Sails, MD;  Location[facility] ENDOSCOPY;  Service: Endoscopy;  Laterality: N/A;  . ESOPHAGOGASTRODUODENOSCOPY (EGD) WITH PROPOFOL N/A 06/12/2017   Procedure: ESOPHAGOGASTRODUODENOSCOPY (EGD) WITH PROPOFOL;  Surgeon: Lollie Sails, MD;  Location: [facility] ENDOSCOPY;  Service: Endoscopy;  Laterality: N/A;  . ESOPHAGOGASTRODUODENOSCOPY (EGD) WITH PROPOFOL N/A 08/25/2017   Procedure:  ESOPHAGOGASTRODUODENOSCOPY (EGD) WITH PROPOFOL;  Surgeon: Lollie Sails, MD;  Location: [facility] ENDOSCOPY;  Service: Endoscopy;  Laterality: N/A;  . HYSTEROSCOPY W/D&C N/A 01/02/2017   Procedure: DILATATION AND CURETTAGE /HYSTEROSCOPY;  Surgeon: Brayton Mars, MD;  Location: [facility] ORS;  Service: Gynecology;  Laterality: N/A;  . JOINT REPLACEMENT Right 2007   Total Knee Replacement  . JOINT REPLACEMENT Left 2004   Total Knee Replacement   . LAPAROSCOPIC HYSTERECTOMY Bilateral 03/01/2017   Procedure: HYSTERECTOMY TOTAL LAPAROSCOPIC BSO;  Surgeon: Mellody Drown, MD;  Location: [facility] ORS;  Service: Gynecology;  Laterality: Bilateral;  . ROTATOR CUFF REPAIR Right   . SENTINEL NODE BIOPSY N/A 03/01/2017   Procedure: SENTINEL NODE INJECTION AND BIOPSY;  Surgeon: Mellody Drown, MD;  Location: [facility] ORS;  Service: Gynecology;  Laterality: N/A;  . TONSILLECTOMY     as a child  . TUBAL LIGATION    . UPPER GI ENDOSCOPY     x2    Family History  Problem Relation Age of Onset  . Stroke Mother   . Hypertension Mother   . Lupus Mother   . Alcohol abuse Father   . Diabetes Father   . Cancer Father        gallbladder ca into liver   . Arthritis Sister   . Diabetes Sister   . Hyperlipidemia Daughter   . Hypertension Daughter   . Cancer Maternal Aunt        ovarian ca  . Diabetes Paternal Aunt   . Drug abuse Paternal Aunt   . Cancer Maternal Grandfather        colon ca  . Hypertension Brother     Allergies  Allergen Reactions  . Clindamycin/Lincomycin Rash  . Codeine Rash  . Morphine And Related Nausea Only  . Talwin [Pentazocine] Nausea And Vomiting  . Lisinopril Cough    cough  . Naloxone Other (See Comments)    Had issues with sedation and vomiting with EGD  . Oxycodone-Acetaminophen Other (See Comments)    Pt states this medication makes her bowels stop moving    Current Outpatient Medications on File Prior to Visit  Medication Sig Dispense Refill  . ACCU-CHEK AVIVA PLUS test strip TEST TWO TIMES DAILY 200 each 1  . acetaminophen (TYLENOL) 500 MG tablet Take 2 tablets (1,000 mg total) by mouth every 6 (six) hours. (Patient taking differently: Take 1,000 mg every 6 (six) hours as needed by mouth. ) 65 tablet 1  . albuterol (PROVENTIL HFA;VENTOLIN HFA) 108 (90 BASE) MCG/ACT inhaler Inhale 2 puffs into the lungs every 6 (six) hours as needed for wheezing or shortness of breath.     . Ascorbic Acid (VITAMIN C) 1000  MG tablet Take 1,000 mg by mouth every evening.     Marland Kitchen aspirin EC 81 MG tablet Take 81 mg by mouth every evening.     Marland Kitchen atorvastatin (LIPITOR) 20 MG tablet Take 20 mg by mouth at bedtime.     Marland Kitchen azelastine (ASTELIN) 0.1 % nasal spray Place 1 spray into both nostrils daily.    . benzonatate (TESSALON) 200 MG capsule     . Biotin 1000 MCG tablet Take 1,000 mcg by mouth 2 (two) times daily.    . Calcium Carbonate-Vitamin D 300-100 MG-UNIT CAPS Take 600 mg at bedtime by mouth.     . Cholecalciferol (D3-1000 PO) Take 1 tablet by mouth daily.     Marland Kitchen CINNAMON PO Take 1,000 mg by mouth 2 (two) times daily.    Marland Kitchen  docusate sodium (COLACE) 100 MG capsule Take 1 capsule (100 mg total) by mouth 2 (two) times daily. (Patient taking differently: Take 100 mg daily by mouth. ) 20 capsule 0  . doxycycline (VIBRA-TABS) 100 MG tablet Take by mouth.    . fish oil-omega-3 fatty acids 1000 MG capsule Take 1 g by mouth every evening.     . fluticasone (FLONASE) 50 MCG/ACT nasal spray Place 1 spray into both nostrils daily. 48 g 1  . furosemide (LASIX) 20 MG tablet TAKE 1 TABLET BY MOUTH  EVERY OTHER DAY 45 tablet 3  . glipiZIDE (GLUCOTROL XL) 5 MG 24 hr tablet TAKE 1 TABLET BY MOUTH  DAILY 90 tablet 1  . insulin detemir (LEVEMIR) 100 UNIT/ML injection Inject 0.15 mLs (15 Units total) into the skin at bedtime. (Patient taking differently: Inject 20 Units into the skin at bedtime. ) 10 mL 11  . Lancet Devices (ACCU-CHEK SOFTCLIX) lancets Use to test blood sugars 1 -2 times daily 1 each 3  . letrozole (FEMARA) 2.5 MG tablet Take 1 tablet (2.5 mg total) by mouth daily. 30 tablet 3  . loratadine (CLARITIN) 10 MG tablet Take 10 mg by mouth daily.    Marland Kitchen losartan-hydrochlorothiazide (HYZAAR) 50-12.5 MG tablet TAKE 1 TABLET BY MOUTH  DAILY 90 tablet 1  . magnesium oxide (MAG-OX) 400 MG tablet Take 400 mg by mouth daily.    . meclizine (ANTIVERT) 25 MG tablet Take 25 mg by mouth 2 (two) times daily as needed for dizziness.    .  metFORMIN (GLUCOPHAGE-XR) 750 MG 24 hr tablet TAKE 2 TABLETS BY MOUTH  DAILY WITH BREAKFAST 180 tablet 1  . Multiple Vitamin (MULTIVITAMIN) tablet Take 1 tablet by mouth daily.    . pantoprazole (PROTONIX) 40 MG tablet Take 1 tablet (40 mg total) by mouth daily. 90 tablet 2  . Probiotic Product (PROBIOTIC-10) CAPS Take 1 capsule by mouth daily.     Marland Kitchen Propylene Glycol (SYSTANE BALANCE OP) Apply 2 drops daily to eye. Both eyes    . sennosides-docusate sodium (SENOKOT-S) 8.6-50 MG tablet Take 1 tablet by mouth daily.    . sertraline (ZOLOFT) 50 MG tablet TAKE 1 TABLET BY MOUTH  DAILY 90 tablet 1  . sitaGLIPtin (JANUVIA) 100 MG tablet Take 1 tablet (100 mg total) by mouth daily. 90 tablet 3  . verapamil (VERELAN PM) 180 MG 24 hr capsule TAKE 1 CAPSULE BY MOUTH  DAILY 90 capsule 1   No current facility-administered medications on file prior to visit.     BP 126/78   Pulse 78   Temp 98 F (36.7 C) (Oral)   Wt 180 lb (81.6 kg)   SpO2 95%   BMI 34.01 kg/m       Objective:   Physical Exam  Constitutional: She appears well-developed and well-nourished.  Eyes: Pupils are equal, round, and reactive to light. No scleral icterus.  Very mild injection of the conjunctiva noted in right eye.  No white spots, opacity, or foreign body appreciated. No collection of blood or pus in the anterior chamber. No ciliary flush surrounding iris.   No photophobia or eye pain appreciated during exam.   Minimal clear drainage noted  Neck: Neck supple.  Cardiovascular: Normal rate and regular rhythm.  Pulmonary/Chest: Effort normal and breath sounds normal. She has no wheezes. She has no rales.  Lymphadenopathy:    She has no cervical adenopathy.  Skin: Skin is warm. No rash noted.       Assessment &  Plan:  1. Viral conjunctivitis of right eye Exam is reassuring; symptom of clear drainage and itching with rhinitis and post nasal drip support symptomatic treatment as viral source. We reviewed symptoms of  bacterial and allergic conjunctivitis. She was evaluated on 10/24/17 for acute upper respiratory symptoms with recent exposure to husband recently diagnosed with pneumonia, provided doxycycline and symptoms improved. Symptoms are most likely viral in nature. Advised OTC Ocuhist or Naphcon-A for symptoms and provided written return precautions for worsening symptoms.  Delano Metz, FNP-C

## 2017-10-26 NOTE — Patient Instructions (Signed)
Ocuhist or Naphcon-A are available over the counter and can be of benefit while symptoms are resolving.   Viral Conjunctivitis, Adult Viral conjunctivitis is an inflammation of the clear membrane that covers the white part of your eye and the inner surface of your eyelid (conjunctiva). The inflammation is caused by a viral infection. The blood vessels in the conjunctiva become inflamed, causing the eye to become red or pink, and often itchy. Viral conjunctivitis can be easily passed from one person to another (is contagious). This condition is often called pink eye. What are the causes? This condition is caused by a virus. A virus is a type of contagious germ. It can be spread by touching objects that have been contaminated with the virus, such as doorknobs or towels. It can also be passed through droplets, such as from coughing or sneezing. What are the signs or symptoms? Symptoms of this condition include:  Eye redness.  Tearing or watery eyes.  Itchy and irritated eyes.  Burning feeling in the eyes.  Clear drainage from the eye.  Swollen eyelids.  A gritty feeling in the eye.  Light sensitivity.  This condition often occurs with other symptoms, such as a fever, nausea, or a rash. How is this diagnosed? This condition is diagnosed with a medical history and physical exam. If you have discharge from your eye, the discharge may be tested to rule out other causes of conjunctivitis. How is this treated? Viral conjunctivitis does not respond to medicines that kill bacteria (antibiotics). Treatment for viral conjunctivitis is directed at stopping a bacterial infection from developing in addition to the viral infection. Treatment also aims to relieve your symptoms, such as itching. This may be done with antihistamine drops or other eye medicines. Rarely, steroid eye drops or antiviral medicines may be prescribed. Follow these instructions at home: Medicines   Take or apply  over-the-counter and prescription medicines only as told by your health care provider.  Be very careful to avoid touching the edge of the eyelid with the eye drop bottle or ointment tube when applying medicines to the affected eye. Being careful this way will stop you from spreading the infection to the other eye or to other people. Eye care  Avoid touching or rubbing your eyes.  Apply a warm, wet, clean washcloth to your eye for 10-20 minutes, 3-4 times per day or as told by your health care provider.  If you wear contact lenses, do not wear them until the inflammation is gone and your health care provider says it is safe to wear them again. Ask your health care provider how to sterilize or replace your contact lenses before using them again. Wear glasses until you can resume wearing contacts.  Avoid wearing eye makeup until the inflammation is gone. Throw away any old eye cosmetics that may be contaminated.  Gently wipe away any drainage from your eye with a warm, wet washcloth or a cotton ball. General instructions  Change or wash your pillowcase every day or as told by your health care provider.  Do not share towels, pillowcases, washcloths, eye makeup, makeup brushes, contact lenses, or glasses. This may spread the infection.  Wash your hands often with soap and water. Use paper towels to dry your hands. If soap and water are not available, use hand sanitizer.  Try to avoid contact with other people for one week or as told by your health care provider. Contact a health care provider if:  Your symptoms do not improve with  treatment or they get worse.  You have increased pain.  Your vision becomes blurry.  You have a fever.  You have facial pain, redness, or swelling.  You have yellow or green drainage coming from your eye.  You have new symptoms. This information is not intended to replace advice given to you by your health care provider. Make sure you discuss any questions  you have with your health care provider. Document Released: 12/03/2002 Document Revised: 04/09/2016 Document Reviewed: 03/29/2016 Elsevier Interactive Patient Education  [employer].

## 2017-10-30 ENCOUNTER — Other Ambulatory Visit: Payer: Self-pay

## 2017-10-30 ENCOUNTER — Encounter: Payer: Self-pay | Admitting: Radiation Oncology

## 2017-10-30 ENCOUNTER — Ambulatory Visit
Admission: RE | Admit: 2017-10-30 | Discharge: 2017-10-30 | Disposition: A | Discharge status: Home / Self Care | Payer: Medicare Other | Source: Ambulatory Visit | Attending: Radiation Oncology | Admitting: Radiation Oncology

## 2017-10-30 VITALS — BP 124/79 | HR 73 | Temp 97.6°F | Resp 20 | Wt 178.4 lb

## 2017-10-30 DIAGNOSIS — C55 Malignant neoplasm of uterus, part unspecified: Secondary | ICD-10-CM

## 2017-10-30 DIAGNOSIS — Z90710 Acquired absence of both cervix and uterus: Secondary | ICD-10-CM | POA: Diagnosis not present

## 2017-10-30 DIAGNOSIS — Z90722 Acquired absence of ovaries, bilateral: Secondary | ICD-10-CM | POA: Insufficient documentation

## 2017-10-30 DIAGNOSIS — Z923 Personal history of irradiation: Secondary | ICD-10-CM | POA: Insufficient documentation

## 2017-10-30 DIAGNOSIS — Z8542 Personal history of malignant neoplasm of other parts of uterus: Secondary | ICD-10-CM | POA: Insufficient documentation

## 2017-10-30 NOTE — Progress Notes (Signed)
Radiation Oncology Follow up Note  Name: Sandra Hendrix   Date:   10/30/2017 MRN:  [id number] DOB: Feb 06, 1946    This 72 y.o. female presents to the clinic today for follow-up on follow-up status post radiation therapy for stage IB well-differentiated endometrioid adenocarcinoma status post TAH/BSO with positive cytology.  REFERRING PROVIDER: Crecencio Mc, MD  HPI: Patient is a 72 year old female now seen out over 5 months having completed. External beam radiation therapy for stage IB endometrial adenocarcinoma status post hysterectomy sentinel lymph node mapping with positive peritoneal cytology. Seen today in routine follow-up she is doing well she specifically denies any abdominal complaints increased lower urinary tract symptoms or diarrhea.  COMPLICATIONS OF TREATMENT: none  FOLLOW UP COMPLIANCE: keeps appointments   PHYSICAL EXAM:  BP 124/79   Pulse 73   Temp 97.6 F (36.4 C)   Resp 20   Wt 178 lb 5.6 oz (80.9 kg)   BMI 33.70 kg/m  On pelvic examination speculum examination shows vaginal vault clear no evidence of mass or nodularity. Bimanual examination shows no evidence of parametrial mass rectal exam is unremarkable. Well-developed well-nourished patient in NAD. HEENT reveals PERLA, EOMI, discs not visualized.  Oral cavity is clear. No oral mucosal lesions are identified. Neck is clear without evidence of cervical or supraclavicular adenopathy. Lungs are clear to A&P. Cardiac examination is essentially unremarkable with regular rate and rhythm without murmur rub or thrill. Abdomen is benign with no organomegaly or masses noted. Motor sensory and DTR levels are equal and symmetric in the upper and lower extremities. Cranial nerves II through XII are grossly intact. Proprioception is intact. No peripheral adenopathy or edema is identified. No motor or sensory levels are noted. Crude visual fields are within normal range.  RADIOLOGY RESULTS: No current films for review  PLAN:  Present time she is doing well with no evidence of disease. I'm please were overall progress. I've asked to see her out in 6 months for follow-up and then will start once your follow-up checks. Patient continues close follow-up care with GYN oncology. Patient knows to call with any concerns.  I would like to take this opportunity to thank you for allowing me to participate in the care of your patient.Noreene Filbert, MD

## 2017-11-16 ENCOUNTER — Ambulatory Visit (INDEPENDENT_AMBULATORY_CARE_PROVIDER_SITE_OTHER): Payer: Medicare Other

## 2017-11-16 DIAGNOSIS — N83209 Unspecified ovarian cyst, unspecified side: Secondary | ICD-10-CM | POA: Diagnosis not present

## 2017-11-22 ENCOUNTER — Ambulatory Visit (INDEPENDENT_AMBULATORY_CARE_PROVIDER_SITE_OTHER): Payer: Medicare Other | Admitting: Obstetrics and Gynecology

## 2017-11-22 ENCOUNTER — Encounter: Payer: Self-pay | Admitting: Obstetrics and Gynecology

## 2017-11-22 VITALS — BP 109/69 | HR 92 | Ht 61.0 in | Wt 177.4 lb

## 2017-11-22 DIAGNOSIS — N952 Postmenopausal atrophic vaginitis: Secondary | ICD-10-CM | POA: Diagnosis not present

## 2017-11-22 DIAGNOSIS — E669 Obesity, unspecified: Secondary | ICD-10-CM

## 2017-11-22 DIAGNOSIS — N393 Stress incontinence (female) (male): Secondary | ICD-10-CM | POA: Diagnosis not present

## 2017-11-22 DIAGNOSIS — C55 Malignant neoplasm of uterus, part unspecified: Secondary | ICD-10-CM

## 2017-11-22 NOTE — Patient Instructions (Signed)
1.  Patient will return to see me after being seen by Dr. Fransisca Connors in GYN oncology.  The intervals between exams will be determined by Dr. Blake Divine recommendation

## 2017-11-23 DIAGNOSIS — N952 Postmenopausal atrophic vaginitis: Secondary | ICD-10-CM | POA: Insufficient documentation

## 2017-11-23 DIAGNOSIS — N393 Stress incontinence (female) (male): Secondary | ICD-10-CM | POA: Insufficient documentation

## 2017-11-23 NOTE — Progress Notes (Signed)
Chief complaint: 1.  Follow-up on endometrioid adenocarcinoma of the uterus, stage Ib  72 year old female, status post hysterectomy BSO with sentinel lymph node sampling, status post pelvic radiation therapy, presents for interval follow-up.  Patient underwent pelvic ultrasound recently-normal  Summary: Stage Ib well-differentiated endometrioid adenocarcinoma of the endometrium  Diagnosis-01/02/2017 hysteroscopy/D&C: Grade 1 endometrioid endometrial cancer Surgical treatment-03/01/2017 TLH, BSO, sentinel lymph node mapping and biopsies:  Pathology:DIAGNOSIS:  A. SENTINEL LYMPH NODE 1, LEFT EXTERNAL ILIAC; EXCISION:  - NO TUMOR SEEN IN ONE LYMPH NODE (0/1).   B. SENTINEL LYMPH NODE 2, LEFT EXTERNAL ILIAC; EXCISION:  - NO TUMOR SEEN IN ONE LYMPH NODE (0/1).   C. SENTINEL LYMPH NODE 3, LEFT EXTERNAL ILIAC; EXCISION:  - NO TUMOR SEEN IN ONE LYMPH NODE (0/1).   D. SENTINEL LYMPH NODE, RIGHT EXTERNAL ILIAC; EXCISION:  - NO TUMOR SEEN IN ONE LYMPH NODE (0/1).   E. UTERUS WITH CERVIX, BILATERAL FALLOPIAN TUBES AND OVARIES;  LAPAROSCOPIC TOTAL HYSTERECTOMY AND BILATERAL SALPINGO-OOPHORECTOMY:  - ENDOMETRIOID CARCINOMA WITH SQUAMOUS DIFFERENTIATION, FIGO GRADE I.  - INVASION EXTENDS INTO THE OUTER HALF OF THE MYOMETRIUM.  - AGE-RELATED CHANGE, BILATERAL OVARIES.  - PARATUBAL CYSTS, BILATERAL FALLOPIAN TUBES.  - SEE SUMMARY BELOW.   Surgical Pathology Cancer Case Summary  ENDOMETRIUM:  Procedure: Laparoscopic total hysterectomy, bilateral  salpingo-oophorectomy and sentinel lymph node excision  Histologic Type: Endometrioid carcinoma with squamous differentiation  Histologic Grade: FIGO grade 1  Myometrial Invasion: Present; Depth of invasion: 26.75 mm; Myometrial  thickness (millimeters): 27 mm; Percentage of myometrial invasion: 99%  Uterine Serosa Involvement: Not identified  Cervical Stromal Involvement: Not identified  Other Tissue/Organ Involvement: Not identified  Peritoneal/  Ascitic Fluid: Positive for malignancy  Margins: Not applicable  Lymphovascular Invasion: Not identified  Regional Lymph Nodes:  # of pelvicnodes with macrometastasis: 0  # of pelvic nodes with micrometastasis: 0  Total # of pelvic nodes examined: 4  # of pelvic sentinel nodes examined: 4  Laterality of pelvic nodes examined: left and right  Pathologic Stage Classification (pTNM, AJCC 8th Edition): pT1b pN0 (sn)  / FIGO IB   Radiation therapy completed 05/2017-due to risk of 20% recurrence; external beam whole pelvic radiation therapy and adjuvant hormonal therapy was recommended (letrozole). Follow-up with Dr. Donella Stade after radiation therapy completed on 10/30/2017; no evidence of disease; to follow-up with him in 6 months  Interval history: Patient is doing well.  Bowel function is normal.  Bladder function is notable for persistent stress incontinence.  Appetite is good.  No significant pelvic pain is identified.  Patient has not seen Dr. Sharlett Iles at [facility] for urogynecology evaluation.  Past medical history, past surgical history, problem list, medications, and allergies are reviewed  OBJECTIVE: BP 109/69   Pulse 92   Ht 5\' 1"  (1.549 m)   Wt 177 lb 6.4 oz (80.5 kg)   BMI 33.52 kg/m  Pleasant female no acute distress.  Alert and oriented. Neck: Soft, nontender; supple without adenopathy or thyromegaly Lungs: Clear Heart: Regular rate and rhythm without murmur Abdomen: Soft, nontender without organomegaly Pelvic exam: External genitalia-mild atrophic changes BUS-urethral caruncle present Vagina-moderate to severe atrophy; first-degree cystocele; no significant rectocele; good apex vaginal support Bimanual-no palpable mass or tenderness Rectovaginal-normal external exam; sphincter tone normal; no rectal masses Extremities: Warm and dry without edema  ASSESSMENT: 1.  Stage Ib well-differentiated endometrioid adenocarcinoma of the endometrium 2.  Status post TLH BSO with sentinel  lymph node sampling 03/01/2017 Status post external beam whole pelvic radiation therapy September 2018  3.  Currently on letrozole hormonal therapy 4.  NED 5.  Stress urinary incontinence 6.  Severe vaginal atrophy  PLAN: 1.  Continue letrozole 2.  Follow-up with Dr. Fransisca Connors as scheduled for next month 3.  Confirm with Dr. Fransisca Connors frequency of alternating exams between GYN and GYN onc 4.  Consider referral to [place] [facility] Dr. Sharlett Iles for stress urinary incontinence management when desired  A total of 25 minutes were spent face-to-face with the patient during this encounter and over half of that time involved counseling and coordination of care.  Brayton Mars, MD  Note: This dictation was prepared with Dragon dictation along with smaller phrase technology. Any transcriptional errors that result from this process are unintentional.

## 2017-11-29 ENCOUNTER — Ambulatory Visit: Payer: Medicare Other

## 2017-12-06 ENCOUNTER — Ambulatory Visit: Payer: Medicare Other

## 2017-12-07 ENCOUNTER — Other Ambulatory Visit: Payer: Self-pay | Admitting: Internal Medicine

## 2017-12-12 ENCOUNTER — Ambulatory Visit (INDEPENDENT_AMBULATORY_CARE_PROVIDER_SITE_OTHER): Payer: Medicare Other

## 2017-12-12 VITALS — BP 110/70 | HR 66 | Temp 98.1°F | Resp 14 | Ht 60.2 in | Wt 171.0 lb

## 2017-12-12 DIAGNOSIS — Z0000 Encounter for general adult medical examination without abnormal findings: Secondary | ICD-10-CM

## 2017-12-12 DIAGNOSIS — Z1231 Encounter for screening mammogram for malignant neoplasm of breast: Secondary | ICD-10-CM

## 2017-12-12 DIAGNOSIS — Z1239 Encounter for other screening for malignant neoplasm of breast: Secondary | ICD-10-CM

## 2017-12-12 NOTE — Progress Notes (Signed)
Subjective:   Sandra Hendrix is a 72 y.o. female who presents for Medicare Annual (Subsequent) preventive examination.  Review of Systems:  No ROS.  Medicare Wellness Visit. Additional risk factors are reflected in the social history.  Cardiac Risk Factors include: advanced age (>66men, >40 women);hypertension;diabetes mellitus     Objective:     Vitals: BP 110/70 (BP Location: Left Arm, Patient Position: Sitting, Cuff Size: Normal)   Pulse 66   Temp 98.1 F (36.7 C) (Oral)   Resp 14   Ht 5' 0.2" (1.529 m)   Wt 171 lb (77.6 kg)   SpO2 97%   BMI 33.17 kg/m   Body mass index is 33.17 kg/m.  Advanced Directives 12/12/2017 10/30/2017 08/02/2017 06/28/2017 06/12/2017 04/13/2017 03/22/2017  Does Patient Have a Medical Advance Directive? Yes No Yes Yes Yes Yes Yes  Type of Paramedic of [place];Living will - [place];Living will - - Living will;Out of facility DNR (pink MOST or yellow form) [place];Living will  Does patient want to make changes to medical advance directive? No - Patient declined - No - Patient declined - - - No - Patient declined  Copy of [place] in Chart? No - copy requested - No - copy requested - - - No - copy requested  Would patient like information on creating a medical advance directive? - No - Patient declined - - - - -    Tobacco Social History   Tobacco Use  Smoking Status Never Smoker  Smokeless Tobacco Never Used     Counseling given: Not Answered   Clinical Intake:  Pre-visit preparation completed: Yes  Pain : No/denies pain     Nutritional Status: BMI > 30  Obese Diabetes: Yes(Followed by PCP)  How often do you need to have someone help you when you read instructions, pamphlets, or other written materials from your doctor or pharmacy?: 1 - Never  Interpreter Needed?: No     Past Medical History:  Diagnosis Date  . Allergy   . Anxiety   .  Arthritis   . Barrett's esophagus determined by endoscopy   . Cancer [facility])    Uterine Cancer  . Chronic kidney disease   . Depression   . Diabetes mellitus without complication ([place])   . Difficult intubation    "not sure what happened,asleep was told had problem with intubation"  . Diverticulosis   . GERD (gastroesophageal reflux disease)   . History of bronchitis   . History of hiatal hernia   . Hyperlipidemia   . Hypertension   . Sleep apnea    Use C- PAP  . Urinary incontinence   . Varicose veins    Past Surgical History:  Procedure Laterality Date  . ABDOMINAL HYSTERECTOMY  03/01/2017  . CHOLECYSTECTOMY    . COLONOSCOPY WITH PROPOFOL N/A 06/14/2016   Procedure: COLONOSCOPY WITH PROPOFOL;  Surgeon: Lollie Sails, MD;  Location: [facility] ENDOSCOPY;  Service: Endoscopy;  Laterality: N/A;  . ESOPHAGOGASTRODUODENOSCOPY (EGD) WITH PROPOFOL N/A 06/14/2016   Procedure: ESOPHAGOGASTRODUODENOSCOPY (EGD) WITH PROPOFOL;  Surgeon: Lollie Sails, MD;  Location: [facility] ENDOSCOPY;  Service: Endoscopy;  Laterality: N/A;  . ESOPHAGOGASTRODUODENOSCOPY (EGD) WITH PROPOFOL N/A 06/12/2017   Procedure: ESOPHAGOGASTRODUODENOSCOPY (EGD) WITH PROPOFOL;  Surgeon: Lollie Sails, MD;  Location: [facility] ENDOSCOPY;  Service: Endoscopy;  Laterality: N/A;  . ESOPHAGOGASTRODUODENOSCOPY (EGD) WITH PROPOFOL N/A 08/25/2017   Procedure: ESOPHAGOGASTRODUODENOSCOPY (EGD) WITH PROPOFOL;  Surgeon: Lollie Sails, MD;  Location: [facility] ENDOSCOPY;  Service: Endoscopy;  Laterality: N/A;  . HYSTEROSCOPY W/D&C N/A 01/02/2017   Procedure: DILATATION AND CURETTAGE /HYSTEROSCOPY;  Surgeon: Brayton Mars, MD;  Location: [facility] ORS;  Service: Gynecology;  Laterality: N/A;  . JOINT REPLACEMENT Right 2007   Total Knee Replacement  . JOINT REPLACEMENT Left 2004   Total Knee Replacement  . LAPAROSCOPIC HYSTERECTOMY Bilateral 03/01/2017   Procedure: HYSTERECTOMY TOTAL LAPAROSCOPIC BSO;  Surgeon: Mellody Drown, MD;   Location: [facility] ORS;  Service: Gynecology;  Laterality: Bilateral;  . ROTATOR CUFF REPAIR Right   . SENTINEL NODE BIOPSY N/A 03/01/2017   Procedure: SENTINEL NODE INJECTION AND BIOPSY;  Surgeon: Mellody Drown, MD;  Location: [facility] ORS;  Service: Gynecology;  Laterality: N/A;  . TONSILLECTOMY     as a child  . TUBAL LIGATION    . UPPER GI ENDOSCOPY     x2   Family History  Problem Relation Age of Onset  . Stroke Mother   . Hypertension Mother   . Lupus Mother   . Alcohol abuse Father   . Diabetes Father   . Cancer Father        gallbladder ca into liver   . Arthritis Sister   . Diabetes Sister   . Hyperlipidemia Daughter   . Hypertension Daughter   . Cancer Maternal Aunt        ovarian ca  . Diabetes Paternal Aunt   . Drug abuse Paternal Aunt   . Cancer Maternal Grandfather        colon ca  . Hypertension Brother    Social History   Socioeconomic History  . Marital status: Married    Spouse name: None  . Number of children: 2  . Years of education: 62  . Highest education level: Some college, no degree  Social Needs  . Financial resource strain: None  . Food insecurity - worry: Never true  . Food insecurity - inability: Never true  . Transportation needs - medical: No  . Transportation needs - non-medical: No  Occupational History  . None  Tobacco Use  . Smoking status: Never Smoker  . Smokeless tobacco: Never Used  Substance and Sexual Activity  . Alcohol use: Yes    Comment: rare  . Drug use: No  . Sexual activity: Yes    Comment: not often  Other Topics Concern  . None  Social History Narrative  . None    Outpatient Encounter Medications as of 12/12/2017  Medication Sig  . ACCU-CHEK AVIVA PLUS test strip TEST TWO TIMES DAILY  . acetaminophen (TYLENOL) 500 MG tablet Take 2 tablets (1,000 mg total) by mouth every 6 (six) hours. (Patient taking differently: Take 1,000 mg every 6 (six) hours as needed by mouth. )  . albuterol (PROVENTIL HFA;VENTOLIN HFA)  108 (90 BASE) MCG/ACT inhaler Inhale 2 puffs into the lungs every 6 (six) hours as needed for wheezing or shortness of breath.   . Ascorbic Acid (VITAMIN C) 1000 MG tablet Take 1,000 mg by mouth every evening.   Marland Kitchen aspirin EC 81 MG tablet Take 81 mg by mouth every evening.   Marland Kitchen atorvastatin (LIPITOR) 40 MG tablet TAKE 1 TABLET BY MOUTH  DAILY AT 6 PM. (Patient taking differently: TAKE 0.5 TABLET BY MOUTH  DAILY AT 6 PM.)  . azelastine (ASTELIN) 0.1 % nasal spray Place 1 spray into both nostrils daily.  . Biotin 1000 MCG tablet Take 1,000 mcg by mouth 2 (two) times daily.  . Calcium Carbonate-Vitamin D  300-100 MG-UNIT CAPS Take 600 mg at bedtime by mouth.   . Cholecalciferol (D3-1000 PO) Take 1 tablet by mouth daily.   Marland Kitchen CINNAMON PO Take 1,000 mg by mouth 2 (two) times daily.  Marland Kitchen docusate sodium (COLACE) 100 MG capsule Take 1 capsule (100 mg total) by mouth 2 (two) times daily. (Patient taking differently: Take 100 mg daily by mouth. )  . fish oil-omega-3 fatty acids 1000 MG capsule Take 1 g by mouth every evening.   . fluticasone (FLONASE) 50 MCG/ACT nasal spray Place 1 spray into both nostrils daily.  . furosemide (LASIX) 20 MG tablet TAKE 1 TABLET BY MOUTH  EVERY OTHER DAY  . glipiZIDE (GLUCOTROL XL) 5 MG 24 hr tablet TAKE 1 TABLET BY MOUTH  DAILY  . insulin detemir (LEVEMIR) 100 UNIT/ML injection Inject 0.15 mLs (15 Units total) into the skin at bedtime. (Patient taking differently: Inject 20 Units into the skin at bedtime. )  . Lancet Devices (ACCU-CHEK SOFTCLIX) lancets Use to test blood sugars 1 -2 times daily  . letrozole (FEMARA) 2.5 MG tablet Take 1 tablet (2.5 mg total) by mouth daily.  Marland Kitchen loratadine (CLARITIN) 10 MG tablet Take 10 mg by mouth daily.  Marland Kitchen losartan-hydrochlorothiazide (HYZAAR) 50-12.5 MG tablet TAKE 1 TABLET BY MOUTH  DAILY  . magnesium oxide (MAG-OX) 400 MG tablet Take 400 mg by mouth daily.  . meclizine (ANTIVERT) 25 MG tablet Take 25 mg by mouth 2 (two) times daily as  needed for dizziness.  . metFORMIN (GLUCOPHAGE-XR) 750 MG 24 hr tablet TAKE 2 TABLETS BY MOUTH  DAILY WITH BREAKFAST  . Multiple Vitamin (MULTIVITAMIN) tablet Take 1 tablet by mouth daily.  . pantoprazole (PROTONIX) 40 MG tablet Take 1 tablet (40 mg total) by mouth daily.  . Potassium 99 MG TABS Take 1 tablet by mouth daily.  . Probiotic Product (PROBIOTIC-10) CAPS Take 1 capsule by mouth daily.   Marland Kitchen Propylene Glycol (SYSTANE BALANCE OP) Apply 2 drops daily to eye. Both eyes  . sennosides-docusate sodium (SENOKOT-S) 8.6-50 MG tablet Take 1 tablet by mouth daily.  . sertraline (ZOLOFT) 50 MG tablet TAKE 1 TABLET BY MOUTH  DAILY  . sitaGLIPtin (JANUVIA) 100 MG tablet Take 1 tablet (100 mg total) by mouth daily.  . verapamil (VERELAN PM) 180 MG 24 hr capsule TAKE 1 CAPSULE BY MOUTH  DAILY  . vitamin B-12 (CYANOCOBALAMIN) 1000 MCG tablet Take 1,000 mcg by mouth daily.  Marland Kitchen atorvastatin (LIPITOR) 20 MG tablet Take 20 mg by mouth at bedtime.    No facility-administered encounter medications on file as of 12/12/2017.     Activities of Daily Living In your present state of health, do you have any difficulty performing the following activities: 12/12/2017 01/30/2017  Hearing? Y Y  Comment - bilateral hearing aids  Vision? N N  Difficulty concentrating or making decisions? N N  Walking or climbing stairs? N N  Dressing or bathing? N N  Doing errands, shopping? N -  Preparing Food and eating ? N -  Using the Toilet? N -  In the past six months, have you accidently leaked urine? Y -  Comment Managing with a daily pad/liner -  Do you have problems with loss of bowel control? N -  Managing your Medications? N -  Managing your Finances? N -  Housekeeping or managing your Housekeeping? N -  Some recent data might be hidden    Patient Care Team: Crecencio Mc, MD as PCP - General (Internal Medicine)  Assessment:   This is a routine wellness examination for Zahraa.  The goal of the wellness  visit is to assist the patient how to close the gaps in care and create a preventative care plan for the patient.   The roster of all physicians providing medical care to patient is listed in the Snapshot section of the chart.  Taking calcium VIT D as appropriate/Osteoporosis risk reviewed.    Safety issues reviewed; Smoke and carbon monoxide detectors in the home. No firearms or firearms locked in a safe within the home. Wears seatbelts when driving or riding with others. No violence in the home.  They do not have excessive sun exposure.  Discussed the need for sun protection: hats, long sleeves and the use of sunscreen if there is significant sun exposure.  Patient is alert, normal appearance, oriented to person/place/and time.  Correctly identified the president of the [place] and recalls of 3/3 words. Performs simple calculations and can read correct time from watch face. Displays appropriate judgement.  No new identified risk were noted.  No failures at ADL's or IADL's.    BMI- discussed the importance of a healthy diet, water intake and the benefits of aerobic exercise. Educational material provided.   24 hour diet recall: Weight watchers  Dental- every 6 months.  Eye- Visual acuity not assessed per patient preference since they have regular follow up with the ophthalmologist.  Wears corrective lenses.  Sleep patterns- Sleeps 5-6 hours at night.  Wakes feeling rested. CPAP in use.  Mammogram ordered, follow as directed.  Patient Concerns: None at this time. Follow up with PCP as needed.  Exercise Activities and Dietary recommendations Current Exercise Habits: Home exercise routine, Type of exercise: walking, Time (Minutes): 20, Frequency (Times/Week): 4, Weekly Exercise (Minutes/Week): 80, Intensity: Mild  Goals    . Increase physical activity     Use the exercise bike Water aerobics  Exercise 150 minutes per week       Fall Risk Fall Risk  12/12/2017 06/28/2017  11/28/2016 10/05/2016 01/04/2016  Falls in the past year? No No Yes Yes Yes  Number falls in past yr: - - 2 or more 2 or more 1  Injury with Fall? - - No No Yes  Comment - - - - Bruised knee  Risk for fall due to : - - - - -  Follow up - - Falls prevention discussed;Education provided - Falls prevention discussed  Comment - - - - Tripped in unfamiliar place on a log.   Depression Screen PHQ 2/9 Scores 12/12/2017 06/28/2017 11/28/2016 10/05/2016  PHQ - 2 Score 0 0 0 0     Cognitive Function MMSE - Mini Mental State Exam 12/12/2017 11/28/2016 10/26/2015  Orientation to time 5 5 5   Orientation to Place 5 5 5   Registration 3 3 3   Attention/ Calculation 5 5 5   Recall 3 3 3   Language- name 2 objects 2 2 2   Language- repeat 1 1 1   Language- follow 3 step command 3 3 3   Language- read & follow direction 1 1 1   Write a sentence 1 1 1   Copy design 1 1 1   Total score 30 30 30         Immunization History  Administered Date(s) Administered  . Influenza Split 05/21/2013, 06/06/2014, 05/28/2017  . Influenza,inj,Quad PF,6+ Mos 06/05/2015  . Influenza-Unspecified 04/26/2012, 06/19/2012, 06/14/2016  . Pneumococcal Conjugate-13 10/21/2013  . Pneumococcal Polysaccharide-23 07/15/2004, 05/08/2010  . Td 11/22/2003  . Tdap 01/17/2014  . Zoster  01/17/2014    Screening Tests Health Maintenance  Topic Date Due  . FOOT EXAM  07/29/2017  . OPHTHALMOLOGY EXAM  10/13/2017  . MAMMOGRAM  12/07/2017  . HEMOGLOBIN A1C  03/27/2018  . TETANUS/TDAP  01/18/2024  . COLONOSCOPY  06/14/2026  . INFLUENZA VACCINE  Completed  . DEXA SCAN  Completed  . Hepatitis C Screening  Completed  . PNA vac Low Risk Adult  Completed       Plan:    End of life planning; Advance aging; Advanced directives discussed. Copy of current HCPOA/Living Will requested.    I have personally reviewed and noted the following in the patient's chart:   . Medical and social history . Use of alcohol, tobacco or illicit drugs  . Current  medications and supplements . Functional ability and status . Nutritional status . Physical activity . Advanced directives . List of other physicians . Hospitalizations, surgeries, and ER visits in previous 12 months . Vitals . Screenings to include cognitive, depression, and falls . Referrals and appointments  In addition, I have reviewed and discussed with patient certain preventive protocols, quality metrics, and best practice recommendations. A written personalized care plan for preventive services as well as general preventive health recommendations were provided to patient.     Varney Biles, LPN  06/15/1006

## 2017-12-12 NOTE — Patient Instructions (Addendum)
  Sandra Hendrix , Thank you for taking time to come for your Medicare Wellness Visit. I appreciate your ongoing commitment to your health goals. Please review the following plan we discussed and let me know if I can assist you in the future.   Follow up with Dr. Derrel Nip as needed.    Bring a copy of your [place] and/or Living Will to be scanned into chart.  Mammogram ordered, follow as directed.  Have a great day!  These are the goals we discussed: Goals    . Increase physical activity     Use the exercise bike Water aerobics  Exercise 150 minutes per week       This is a list of the screening recommended for you and due dates:  Health Maintenance  Topic Date Due  . Complete foot exam   07/29/2017  . Eye exam for diabetics  10/13/2017  . Mammogram  12/07/2017  . Hemoglobin A1C  03/27/2018  . Tetanus Vaccine  01/18/2024  . Colon Cancer Screening  06/14/2026  . Flu Shot  Completed  . DEXA scan (bone density measurement)  Completed  .  Hepatitis C: One time screening is recommended by Center for Disease Control  (CDC) for  adults born from [birth year] through 1967.   Completed  . Pneumonia vaccines  Completed

## 2017-12-20 ENCOUNTER — Encounter: Payer: Self-pay | Admitting: Obstetrics and Gynecology

## 2017-12-20 ENCOUNTER — Ambulatory Visit: Payer: Medicare Other

## 2017-12-20 ENCOUNTER — Inpatient Hospital Stay: Payer: Medicare Other | Attending: Obstetrics and Gynecology | Admitting: Obstetrics and Gynecology

## 2017-12-20 VITALS — BP 103/70 | HR 68 | Temp 98.2°F | Resp 18 | Ht 60.0 in | Wt 171.0 lb

## 2017-12-20 DIAGNOSIS — Z79899 Other long term (current) drug therapy: Secondary | ICD-10-CM | POA: Insufficient documentation

## 2017-12-20 DIAGNOSIS — N393 Stress incontinence (female) (male): Secondary | ICD-10-CM | POA: Insufficient documentation

## 2017-12-20 DIAGNOSIS — E1122 Type 2 diabetes mellitus with diabetic chronic kidney disease: Secondary | ICD-10-CM | POA: Insufficient documentation

## 2017-12-20 DIAGNOSIS — I872 Venous insufficiency (chronic) (peripheral): Secondary | ICD-10-CM | POA: Insufficient documentation

## 2017-12-20 DIAGNOSIS — I129 Hypertensive chronic kidney disease with stage 1 through stage 4 chronic kidney disease, or unspecified chronic kidney disease: Secondary | ICD-10-CM | POA: Insufficient documentation

## 2017-12-20 DIAGNOSIS — N183 Chronic kidney disease, stage 3 (moderate): Secondary | ICD-10-CM | POA: Diagnosis not present

## 2017-12-20 DIAGNOSIS — C541 Malignant neoplasm of endometrium: Secondary | ICD-10-CM | POA: Diagnosis not present

## 2017-12-20 DIAGNOSIS — C55 Malignant neoplasm of uterus, part unspecified: Secondary | ICD-10-CM

## 2017-12-20 NOTE — Progress Notes (Signed)
Gynecologic Oncology Interval Visit   Referring Provider: Brayton Mars, MD  Chief Concern: Stage IB WELL-DIFFERENTIATED ENDOMETRIOID ADENOCARCINOMA of the endometrium  Subjective:  Sandra Hendrix is a 72 y.o. female who is seen in consultation from Dr. Enzo Bi for grade 1 endometrial cancer, s/p TLH, BSO, SLN mapping and biopsies, XRT, returns today for routine follow-up and pelvic exam.   In the interim, she has seen Dr. Marisue Humble and Dr. Baruch Gouty for follow-up. Transvaginal u/s with Dr. Enzo Bi on 11/16/17 was negative for cysts or masses.   Today, she reports overall feeling well. She reports mild vulvar/right labial irritation for past 3 weeks. She has used hydrocortisone to the area with some improvement. Continues letrozole. Hot flashes are mild and do not interfere with sleep. Continues to have incontinence with sneezing or coughing which is chronic and unchanged. We have previously discussed further evaluation with Dr. Sharlett Iles, uro-onc, at [facility], but she has declined.  uro-oncologist, Dr. Sharlett Iles.   Cancer history She presented with postmenopausal bleeding. Her evaluation included Pap on 10/17/2016 with endometrial cells present otherwise NILM; and on 11/16/2016 endometrial biopsy that revealed atypical glandular epithelium.   Her evaluation also included pelvic US 10/17/2016 that showed uterine mass.  She underwent hysteroscopy/D&C on 01/02/2017 that revealed grade 1 endometrioid endometrial cancer.  Definitive cancer surgery was scheduled 5/9, but was delayed a month due to death of her bother on Feb 24, 2023.     Underwent TLH, BSO, SLN mapping and biopsieson 03/01/17  DIAGNOSIS:  A. SENTINEL LYMPH NODE 1, LEFT EXTERNAL ILIAC; EXCISION:  - NO TUMOR SEEN IN ONE LYMPH NODE (0/1).   B. SENTINEL LYMPH NODE 2, LEFT EXTERNAL ILIAC; EXCISION:  - NO TUMOR SEEN IN ONE LYMPH NODE (0/1).   C. SENTINEL LYMPH NODE 3, LEFT EXTERNAL ILIAC; EXCISION:  - NO TUMOR SEEN IN ONE LYMPH NODE  (0/1).   D. SENTINEL LYMPH NODE, RIGHT EXTERNAL ILIAC; EXCISION:  - NO TUMOR SEEN IN ONE LYMPH NODE (0/1).   E. UTERUS WITH CERVIX, BILATERAL FALLOPIAN TUBES AND OVARIES;  LAPAROSCOPIC TOTAL HYSTERECTOMY AND BILATERAL SALPINGO-OOPHORECTOMY:  - ENDOMETRIOID CARCINOMA WITH SQUAMOUS DIFFERENTIATION, FIGO GRADE I.  - INVASION EXTENDS INTO THE OUTER HALF OF THE MYOMETRIUM.  - AGE-RELATED CHANGE, BILATERAL OVARIES.  - PARATUBAL CYSTS, BILATERAL FALLOPIAN TUBES.  - SEE SUMMARY BELOW.   Surgical Pathology Cancer Case Summary  ENDOMETRIUM:  Procedure: Laparoscopic total hysterectomy, bilateral  salpingo-oophorectomy and sentinel lymph node excision  Histologic Type: Endometrioid carcinoma with squamous differentiation  Histologic Grade: FIGO grade 1  Myometrial Invasion: Present; Depth of invasion: 26.75 mm; Myometrial  thickness (millimeters): 27 mm; Percentage of myometrial invasion: 99%  Uterine Serosa Involvement: Not identified  Cervical Stromal Involvement: Not identified  Other Tissue/Organ Involvement: Not identified  Peritoneal/ Ascitic Fluid: Positive for malignancy  Margins: Not applicable  Lymphovascular Invasion: Not identified  Regional Lymph Nodes:  # of pelvicnodes with macrometastasis: 0  # of pelvic nodes with micrometastasis: 0  Total # of pelvic nodes examined: 4  # of pelvic sentinel nodes examined: 4  Laterality of pelvic nodes examined: left and right  Pathologic Stage Classification (pTNM, AJCC 8th Edition): pT1b pN0 (sn)  / FIGO IB   She received adjuvant whole pelvic radiation w/ Dr. Baruch Gouty over 5 weeks completing 05/23/17. She did have a post-radiation UTI which was treated and resolved with antibiotics.   She initiated on Letrozole on 05/15/17.   Problem List: Patient Active Problem List   Diagnosis Date Noted  . SUI (stress urinary incontinence,  female) 11/23/2017  . Vaginal atrophy 11/23/2017  . Obesity (BMI 30.0-34.9) 06/20/2017  . Carpal tunnel  syndrome on right 06/20/2017  . Endometrioid adenocarcinoma of uterus ([place]) 01/19/2017  . OSA on CPAP 11/24/2016  . Cyst of ovary 11/16/2016  . Cervical stenosis (uterine cervix) 11/16/2016  . Cyst, vulva 10/11/2015  . CKD (chronic kidney disease) stage 3, GFR 30-59 ml/min (HCC) 11/07/2014  . GERD (gastroesophageal reflux disease) 12/26/2013  . Irritable bowel syndrome with constipation 12/26/2013  . Hyperlipidemia LDL goal <100 07/15/2013  . Chronic venous insufficiency 03/21/2013  . DM (diabetes mellitus), type 2 with renal complications ([place]) 40/04/6760  . Lack of libido 11/05/2012  . Barrett's esophagus 11/05/2012  . Hiatal hernia 11/05/2012  . Essential hypertension 11/05/2012  . Urinary incontinence    Past Medical History: Past Medical History:  Diagnosis Date  . Allergy   . Anxiety   . Arthritis   . Barrett's esophagus determined by endoscopy   . Cancer [facility])    Uterine Cancer  . Chronic kidney disease   . Depression   . Diabetes mellitus without complication ([place])   . Difficult intubation    "not sure what happened,asleep was told had problem with intubation"  . Diverticulosis   . GERD (gastroesophageal reflux disease)   . History of bronchitis   . History of hiatal hernia   . Hyperlipidemia   . Hypertension   . Sleep apnea    Use C- PAP  . Urinary incontinence   . Varicose veins    Past Surgical History: Past Surgical History:  Procedure Laterality Date  . ABDOMINAL HYSTERECTOMY  03/01/2017  . CHOLECYSTECTOMY    . COLONOSCOPY WITH PROPOFOL N/A 06/14/2016   Procedure: COLONOSCOPY WITH PROPOFOL;  Surgeon: Lollie Sails, MD;  Location: [facility] ENDOSCOPY;  Service: Endoscopy;  Laterality: N/A;  . ESOPHAGOGASTRODUODENOSCOPY (EGD) WITH PROPOFOL N/A 06/14/2016   Procedure: ESOPHAGOGASTRODUODENOSCOPY (EGD) WITH PROPOFOL;  Surgeon: Lollie Sails, MD;  Location: [facility] ENDOSCOPY;  Service: Endoscopy;  Laterality: N/A;  . ESOPHAGOGASTRODUODENOSCOPY (EGD) WITH  PROPOFOL N/A 06/12/2017   Procedure: ESOPHAGOGASTRODUODENOSCOPY (EGD) WITH PROPOFOL;  Surgeon: Lollie Sails, MD;  Location: [facility] ENDOSCOPY;  Service: Endoscopy;  Laterality: N/A;  . ESOPHAGOGASTRODUODENOSCOPY (EGD) WITH PROPOFOL N/A 08/25/2017   Procedure: ESOPHAGOGASTRODUODENOSCOPY (EGD) WITH PROPOFOL;  Surgeon: Lollie Sails, MD;  Location: [facility] ENDOSCOPY;  Service: Endoscopy;  Laterality: N/A;  . HYSTEROSCOPY W/D&C N/A 01/02/2017   Procedure: DILATATION AND CURETTAGE /HYSTEROSCOPY;  Surgeon: Brayton Mars, MD;  Location: [facility] ORS;  Service: Gynecology;  Laterality: N/A;  . JOINT REPLACEMENT Right 2007   Total Knee Replacement  . JOINT REPLACEMENT Left 2004   Total Knee Replacement  . LAPAROSCOPIC HYSTERECTOMY Bilateral 03/01/2017   Procedure: HYSTERECTOMY TOTAL LAPAROSCOPIC BSO;  Surgeon: Mellody Drown, MD;  Location: [facility] ORS;  Service: Gynecology;  Laterality: Bilateral;  . ROTATOR CUFF REPAIR Right   . SENTINEL NODE BIOPSY N/A 03/01/2017   Procedure: SENTINEL NODE INJECTION AND BIOPSY;  Surgeon: Mellody Drown, MD;  Location: [facility] ORS;  Service: Gynecology;  Laterality: N/A;  . TONSILLECTOMY     as a child  . TUBAL LIGATION    . UPPER GI ENDOSCOPY     x2   Past Gynecologic History:  Patient is postmenopausal. Menarche: 8 History of Abnormal pap: yes, endometrial cells w/o NILM Last pap: 2018 as per interval history  Last Pap:10/17/2016 negative for intraepithelial lesions or malignancy. Benign reparative/reactive changes. ENDOMETRIAL CELLS PRESENT Sexually active: no;  She did not have mammogram in  2018. 2019 Mammogram has been ordered.   OB History:  OB History  Gravida Para Term Preterm AB Living  2 2 2     2   SAB TAB Ectopic Multiple Live Births          2    # Outcome Date GA Lbr Len/2nd Weight Sex Delivery Anes PTL Lv  2 Term 1972   9 lb (4.082 kg) F Vag-Spont   LIV  1 Term 1970   9 lb (4.082 kg) F Vag-Spont   LIV    Family History: Family  History  Problem Relation Age of Onset  . Stroke Mother   . Hypertension Mother   . Lupus Mother   . Alcohol abuse Father   . Diabetes Father   . Cancer Father        gallbladder ca into liver   . Arthritis Sister   . Diabetes Sister   . Hyperlipidemia Daughter   . Hypertension Daughter   . Cancer Maternal Aunt        ovarian ca  . Diabetes Paternal Aunt   . Drug abuse Paternal Aunt   . Cancer Maternal Grandfather        colon ca  . Hypertension Brother     Social History: Social History   Socioeconomic History  . Marital status: Married    Spouse name: Not on file  . Number of children: 2  . Years of education: 55  . Highest education level: Some college, no degree  Occupational History  . Not on file  Social Needs  . Financial resource strain: Not on file  . Food insecurity:    Worry: Never true    Inability: Never true  . Transportation needs:    Medical: No    Non-medical: No  Tobacco Use  . Smoking status: Never Smoker  . Smokeless tobacco: Never Used  Substance and Sexual Activity  . Alcohol use: Yes    Comment: rare  . Drug use: No  . Sexual activity: Yes    Comment: not often  Lifestyle  . Physical activity:    Days per week: 3 days    Minutes per session: 60 min  . Stress: Not at all  Relationships  . Social connections:    Talks on phone: More than three times a week    Gets together: More than three times a week    Attends religious service: More than 4 times per year    Active member of club or organization: Yes    Attends meetings of clubs or organizations: More than 4 times per year    Relationship status: Married  . Intimate partner violence:    Fear of current or ex partner: No    Emotionally abused: No    Physically abused: No    Forced sexual activity: No  Other Topics Concern  . Not on file  Social History Narrative  . Not on file    Allergies: Allergies  Allergen Reactions  . Clindamycin/Lincomycin Rash  . Codeine Rash   . Lincomycin Rash  . Morphine And Related Nausea Only  . Talwin [Pentazocine] Nausea And Vomiting  . Lisinopril Cough    cough  . Naloxone Other (See Comments)    Had issues with sedation and vomiting with EGD  . Oxycodone-Acetaminophen Other (See Comments)    Pt states this medication makes her bowels stop moving    Current Medications: Current Outpatient Medications  Medication Sig Dispense Refill  . ACCU-CHEK AVIVA  PLUS test strip TEST TWO TIMES DAILY 200 each 1  . acetaminophen (TYLENOL) 500 MG tablet Take 2 tablets (1,000 mg total) by mouth every 6 (six) hours. (Patient taking differently: Take 1,000 mg every 6 (six) hours as needed by mouth. ) 65 tablet 1  . albuterol (PROVENTIL HFA;VENTOLIN HFA) 108 (90 BASE) MCG/ACT inhaler Inhale 2 puffs into the lungs every 6 (six) hours as needed for wheezing or shortness of breath.     . Ascorbic Acid (VITAMIN C) 1000 MG tablet Take 1,000 mg by mouth every evening.     Marland Kitchen aspirin EC 81 MG tablet Take 81 mg by mouth every evening.     Marland Kitchen atorvastatin (LIPITOR) 20 MG tablet Take 20 mg by mouth at bedtime.     . Biotin 1000 MCG tablet Take 1,000 mcg by mouth 2 (two) times daily.    . Calcium Carbonate-Vitamin D 300-100 MG-UNIT CAPS Take 600 mg at bedtime by mouth.     . Cholecalciferol (D3-1000 PO) Take 1 tablet by mouth daily.     Marland Kitchen CINNAMON PO Take 1,000 mg by mouth 2 (two) times daily.    . fish oil-omega-3 fatty acids 1000 MG capsule Take 1 g by mouth every evening.     . furosemide (LASIX) 20 MG tablet TAKE 1 TABLET BY MOUTH  EVERY OTHER DAY 45 tablet 3  . glipiZIDE (GLUCOTROL XL) 5 MG 24 hr tablet TAKE 1 TABLET BY MOUTH  DAILY 90 tablet 1  . insulin detemir (LEVEMIR) 100 UNIT/ML injection Inject 0.15 mLs (15 Units total) into the skin at bedtime. (Patient taking differently: Inject 20 Units into the skin at bedtime. ) 10 mL 11  . Lancet Devices (ACCU-CHEK SOFTCLIX) lancets Use to test blood sugars 1 -2 times daily 1 each 3  . letrozole  (FEMARA) 2.5 MG tablet Take 1 tablet (2.5 mg total) by mouth daily. 30 tablet 3  . loratadine (CLARITIN) 10 MG tablet Take 10 mg by mouth daily.    Marland Kitchen losartan-hydrochlorothiazide (HYZAAR) 50-12.5 MG tablet TAKE 1 TABLET BY MOUTH  DAILY 90 tablet 1  . magnesium oxide (MAG-OX) 400 MG tablet Take 400 mg by mouth daily.    . metFORMIN (GLUCOPHAGE-XR) 750 MG 24 hr tablet TAKE 2 TABLETS BY MOUTH  DAILY WITH BREAKFAST 180 tablet 1  . Multiple Vitamin (MULTIVITAMIN) tablet Take 1 tablet by mouth daily.    . pantoprazole (PROTONIX) 40 MG tablet Take 1 tablet (40 mg total) by mouth daily. 90 tablet 2  . Potassium 99 MG TABS Take 1 tablet by mouth daily.    . Probiotic Product (PROBIOTIC-10) CAPS Take 1 capsule by mouth daily.     Marland Kitchen Propylene Glycol (SYSTANE BALANCE OP) Apply 2 drops daily to eye. Both eyes    . sertraline (ZOLOFT) 50 MG tablet TAKE 1 TABLET BY MOUTH  DAILY 90 tablet 1  . sitaGLIPtin (JANUVIA) 100 MG tablet Take 1 tablet (100 mg total) by mouth daily. 90 tablet 3  . verapamil (VERELAN PM) 180 MG 24 hr capsule TAKE 1 CAPSULE BY MOUTH  DAILY 90 capsule 1  . vitamin B-12 (CYANOCOBALAMIN) 1000 MCG tablet Take 1,000 mcg by mouth daily.    Marland Kitchen atorvastatin (LIPITOR) 40 MG tablet TAKE 1 TABLET BY MOUTH  DAILY AT 6 PM. (Patient taking differently: TAKE 0.5 TABLET BY MOUTH  DAILY AT 6 PM.) 90 tablet 1  . azelastine (ASTELIN) 0.1 % nasal spray Place 1 spray into both nostrils daily.    Marland Kitchen docusate  sodium (COLACE) 100 MG capsule Take 1 capsule (100 mg total) by mouth 2 (two) times daily. (Patient not taking: Reported on 12/20/2017) 20 capsule 0  . fluticasone (FLONASE) 50 MCG/ACT nasal spray Place 1 spray into both nostrils daily. (Patient not taking: Reported on 12/20/2017) 48 g 1  . meclizine (ANTIVERT) 25 MG tablet Take 25 mg by mouth 2 (two) times daily as needed for dizziness.    . sennosides-docusate sodium (SENOKOT-S) 8.6-50 MG tablet Take 1 tablet by mouth daily.     No current  facility-administered medications for this visit.     Review of Systems General: positive for weight loss, negative for, fevers, chills, fatigue Intentional weight loss Skin: negative for dry skin, hair changes, lumps, mole changes and rash Eyes: negative for, changes in vision, pain, diplopia HEENT: negative for hearing loss, nasal congestion and sore throat Pulmonary: negative for, dyspnea, productive cough Cardiac: negative for, palpitations, syncope, discomfort Gastrointestinal: positive for dyspepsia, dysphagia and reflux symptoms, negative for, nausea, vomiting, constipation, diarrhea Genitourinary/Sexual: positive for urinary incontinence, negative for, dysuria, discharge. Stress incontinence h/o SVD x2 with babies >8lbs.   Ob/Gyn: denies abnormal bleeding Musculoskeletal: positive for stiff joints, negative for, pain Hematology: negative for abnormal bleeding or bruising Neurologic/Psych: negative for, headaches, seizures, paralysis, weakness  Objective:  Physical Examination:  BP 103/70 (BP Location: Right Arm, Patient Position: Sitting)   Pulse 68   Temp 98.2 F (36.8 C) (Tympanic)   Resp 18   Ht 5' (1.524 m)   Wt 171 lb (77.6 kg)   BMI 33.40 kg/m   ECOG Performance Status: 1 - Symptomatic but completely ambulatory  General appearance: alert, cooperative and appears stated age HEENT:PERRLA, extra ocular movement intact and sclera clear, anicteric Lymph node survey: non-palpable, axillary, inguinal, supraclavicular Cardiovascular: regular rate and rhythm, no murmurs or gallops Respiratory: normal air entry, lungs clear to auscultation Abdomen: soft, non-tender, without masses or organomegaly, no hernias and well healed incision Back: inspection of back is normal Extremities: extremities normal, atraumatic, no cyanosis or edema Skin exam - normal coloration and turgor, no rashes, no suspicious skin lesions noted. Neurological exam reveals alert, oriented, normal  speech, no focal findings or movement disorder noted.  Pelvic: exam chaperoned by NP;  Vulva: normal appearing vulva w/o masses, tenderness or lesions. Vagina: normal vagina with well healed cuff. Bimanual/RV: no evidences of masses.   Assessment:  Sandra Hendrix is a 72 y.o. female w/ hx of stage IB, grade 1 endometrioid cancer w/ hx of of disease invading full thickness of myometrium and positive pelvic peritoneal cytology, bilaterial sentinel nodes, cervix, and advexa were negative. She is s/p whole pelvic radiation and now on Letrozole.  She feels well, tolerating letrozole w/o significant side effects other than hot flashes and no evidence of disease on exam.   Stress urinary incontinence is stable and unchanged.   Medical co-morbidities complicating care include: OSA on CPAP, HTN DM, CKD, and obesity with BMI 33.4. She has started weight watchers and is actively trying to lose weight.   Plan:   Problem List Items Addressed This Visit      Genitourinary   Endometrioid adenocarcinoma of uterus ([place]) - Primary     Will plan for patient to continue anti-estrogen therapy (letrozole) and return to clinic in 4 months for pelvic & follow-up alternating visits with Dr. Baruch Gouty.   Urinary incontinence- symptoms are stable. If patient wishes, she can set up appt with Dr. Sharlett Iles at [facility] for further evaluation.   Ander Purpura  Candise Che, AGNP-C [place] [facility] [place] (work cell) [phone] (office) 12/20/17 3:35 PM   I personally interviewed and examined the patient. Agreed with the above/below plan of care. Patient/family questions were answered.  Mellody Drown, MD   CC: Brayton Mars, MD  PCP: Crecencio Mc, MD [address] [place], [place] [address] [phone]

## 2017-12-20 NOTE — Progress Notes (Signed)
Vaginal irratation to right vulvar x 3 weeks Sandra Hendrix

## 2017-12-20 NOTE — Progress Notes (Signed)
Chaperoned pelvic exam. Dr. Fransisca Connors will rotate with Dr. Baruch Gouty at this time and see her back in 08/2018 Oncology Nurse Navigator Documentation  Navigator Location: [facility] (12/20/17 1500)   )Navigator Encounter Type: Follow-up Appt (12/20/17 1500)                     Patient Visit Type: GynOnc (12/20/17 1500)                              Time Spent with Patient: 15 (12/20/17 1500)

## 2017-12-26 LAB — HM DIABETES EYE EXAM

## 2018-01-04 LAB — HM DIABETES EYE EXAM

## 2018-01-24 ENCOUNTER — Other Ambulatory Visit: Payer: Self-pay | Admitting: *Deleted

## 2018-01-24 MED ORDER — LETROZOLE 2.5 MG PO TABS: 2.5000 mg | ORAL_TABLET | Freq: Every day | ORAL | 3 refills | Status: DC

## 2018-01-25 ENCOUNTER — Other Ambulatory Visit: Payer: Self-pay | Admitting: Internal Medicine

## 2018-02-13 IMAGING — US US EXTREM LOW VENOUS*L*
1 series · 13 of 24 positions shown · non-contrast
Comparison: None.

CLINICAL DATA: Upper left leg pain beginning last night.



[Series 1: us extrem low venous*left* · 0.08mm/px · 13 of 36 slices shown]
[im 1/36]
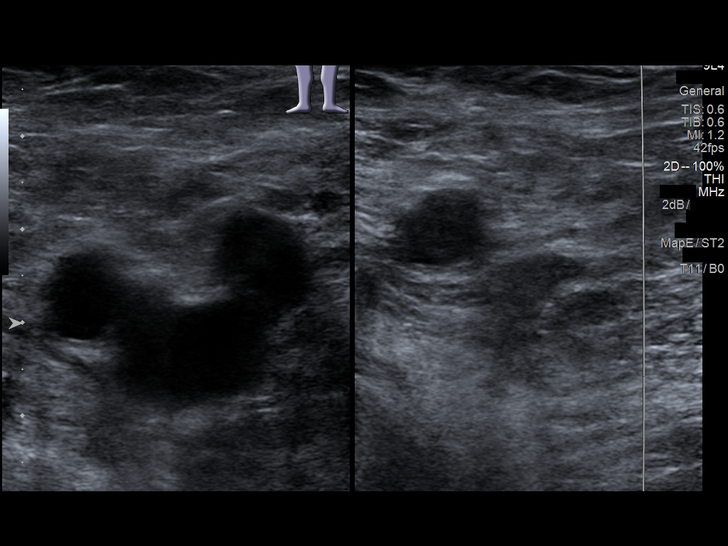
[im 4/36]
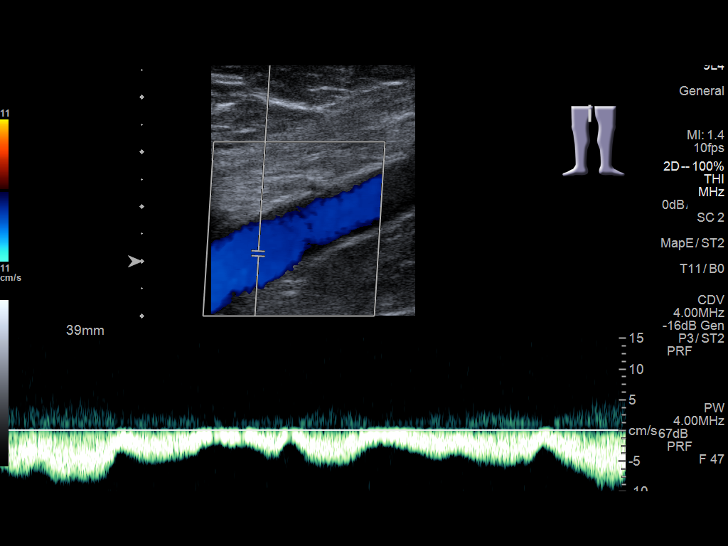
[im 7/36]
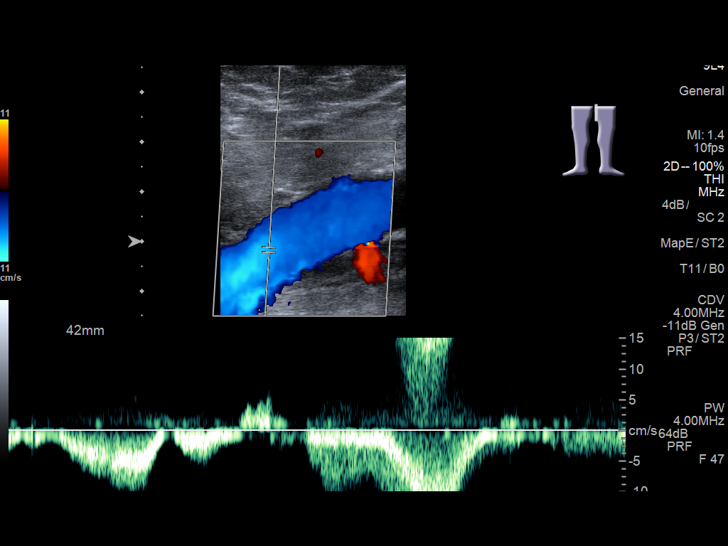
[im 10/36]
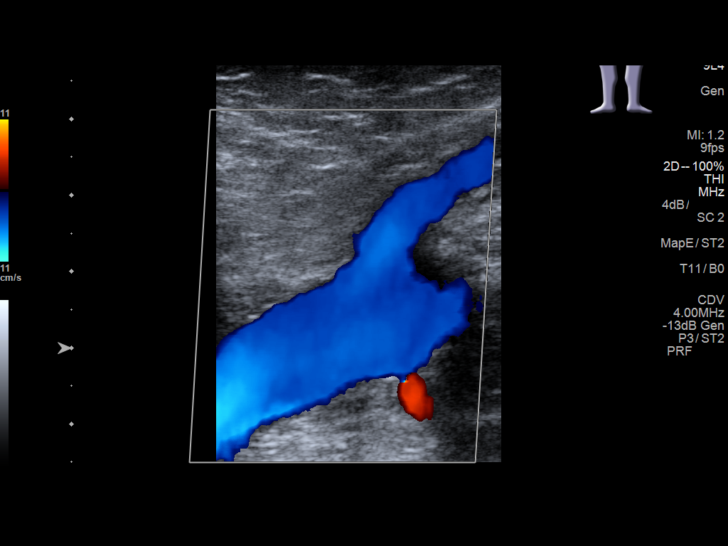
[im 13/36]
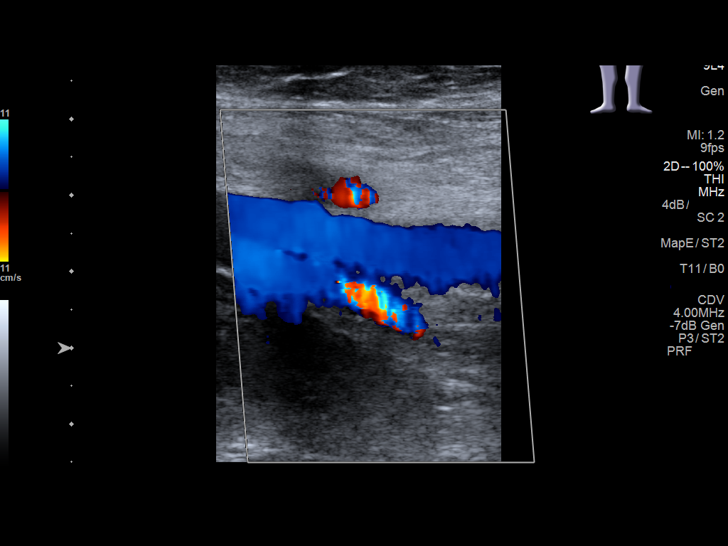
[im 16/36]
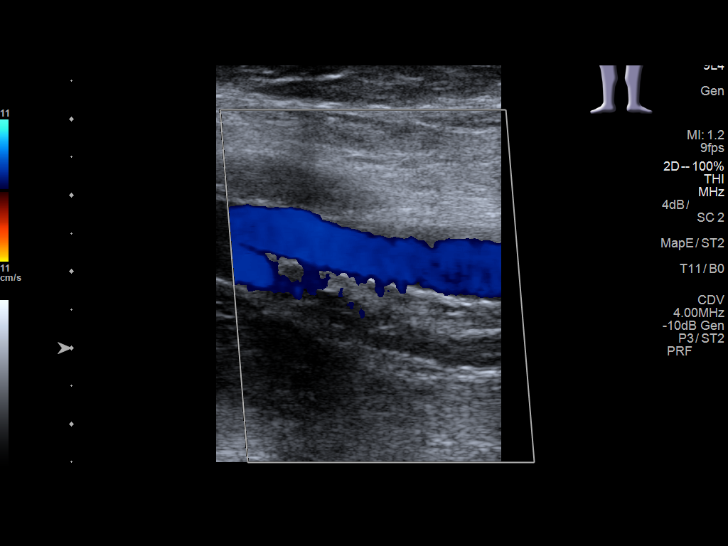
[im 19/36]
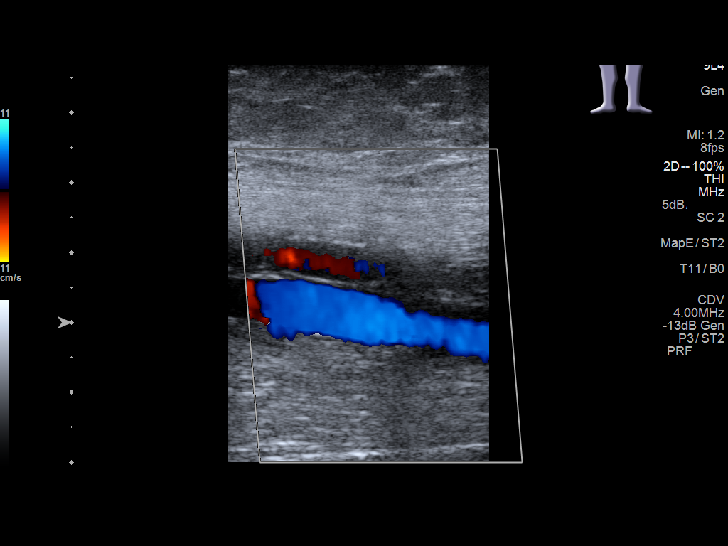
[im 20/36]
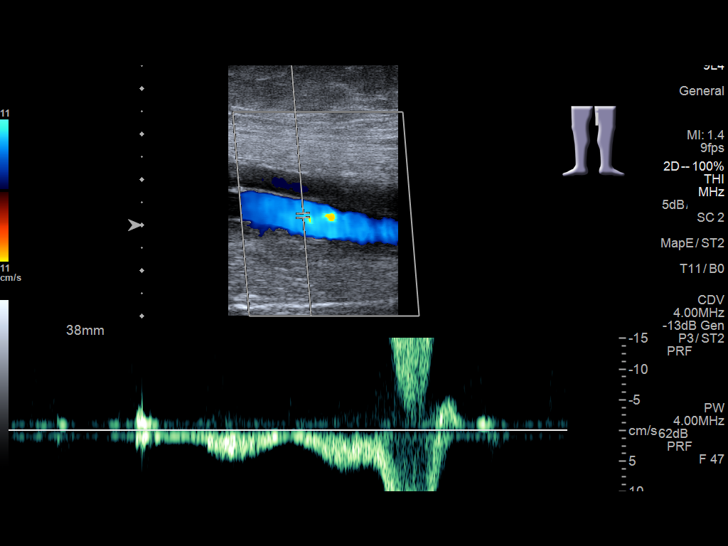
[im 23/36]
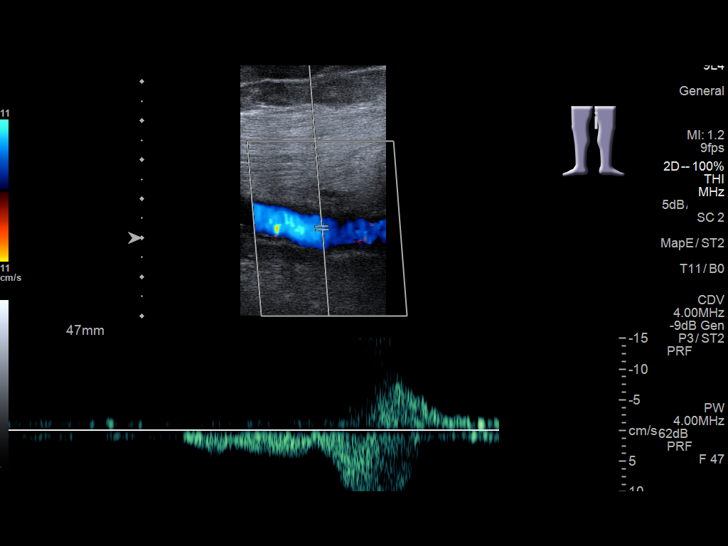
[im 26/36]
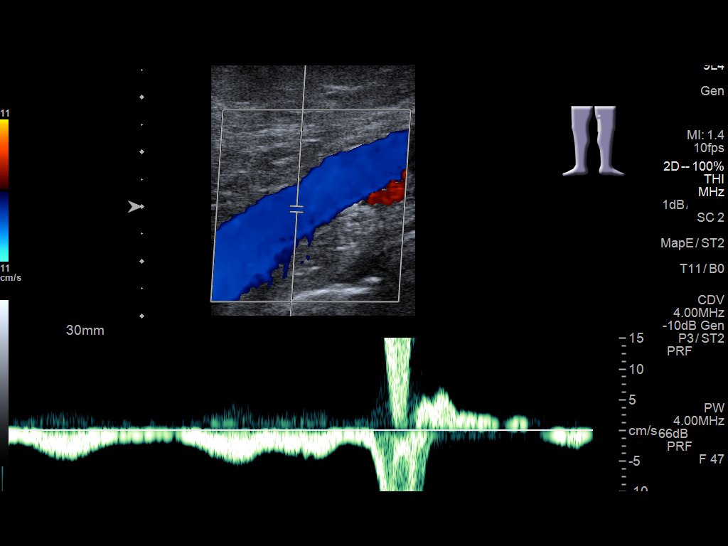
[im 29/36]
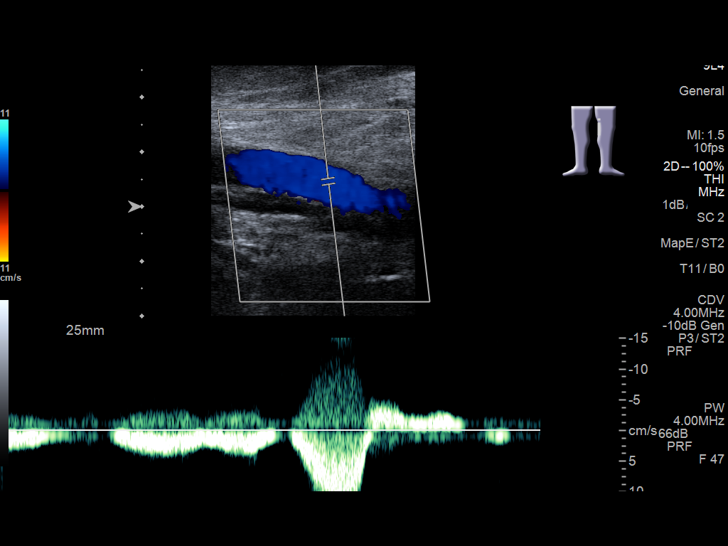
[im 32/36]
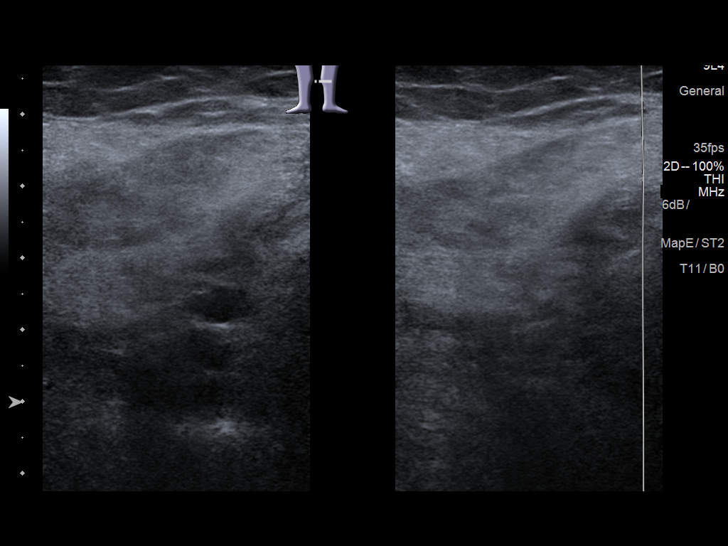
[im 36/36]
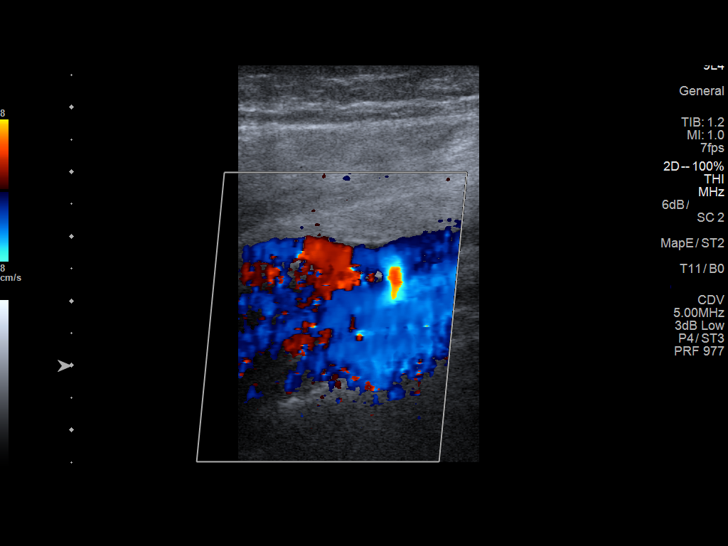

[13 of 24 positions shown; findings below may reference images not displayed]

FINDINGS: Contralateral Common Femoral Vein: Respiratory phasicity is normal
and symmetric with the symptomatic side. No evidence of thrombus.
Normal compressibility.

Common Femoral Vein: No evidence of thrombus. Normal
compressibility, respiratory phasicity and response to augmentation.

Saphenofemoral Junction: No evidence of thrombus. Normal
compressibility and flow on color Doppler imaging.

Profunda Femoral Vein: No evidence of thrombus. Normal
compressibility and flow on color Doppler imaging.

Femoral Vein: No evidence of thrombus. Normal compressibility,
respiratory phasicity and response to augmentation.

Popliteal Vein: No evidence of thrombus. Normal compressibility,
respiratory phasicity and response to augmentation.

Calf Veins: No evidence of thrombus. Normal compressibility and flow
on color Doppler imaging.

Superficial Great Saphenous Vein: No evidence of thrombus. Normal
compressibility and flow on color Doppler imaging.

Venous Reflux:  None.

Other Findings:  None.
IMPRESSION: No evidence of DVT within the left lower extremity.

## 2018-02-16 ENCOUNTER — Other Ambulatory Visit: Payer: Self-pay | Admitting: Internal Medicine

## 2018-02-19 ENCOUNTER — Encounter: Payer: Self-pay | Admitting: Internal Medicine

## 2018-02-26 LAB — HM MAMMOGRAPHY

## 2018-02-28 ENCOUNTER — Ambulatory Visit (INDEPENDENT_AMBULATORY_CARE_PROVIDER_SITE_OTHER): Payer: Medicare Other | Admitting: Internal Medicine

## 2018-02-28 ENCOUNTER — Encounter: Payer: Self-pay | Admitting: Internal Medicine

## 2018-02-28 VITALS — BP 114/72 | HR 64 | Temp 98.0°F | Wt 168.0 lb

## 2018-02-28 DIAGNOSIS — L03116 Cellulitis of left lower limb: Secondary | ICD-10-CM

## 2018-02-28 DIAGNOSIS — E1142 Type 2 diabetes mellitus with diabetic polyneuropathy: Secondary | ICD-10-CM

## 2018-02-28 MED ORDER — CEPHALEXIN 500 MG PO CAPS: 500.0000 mg | ORAL_CAPSULE | Freq: Three times a day (TID) | ORAL | 0 refills | Status: DC

## 2018-02-28 NOTE — Progress Notes (Signed)
Subjective:    Patient ID: Sandra Hendrix, female    DOB: December 20, 1945, 72 y.o.   MRN: [id number]  HPI  Pt presents to the clinic today with c/o left ankle pain and redness. She reports this started 3-4 days ago. She denies any injury to the area, but reports she may have bumped it up against something or got bitten. She reports the area is warm to touch. She denies fever, chills or body aches. She has not tried anything OTC. She has a history of DM 2 with neuropathy.  Review of Systems      Past Medical History:  Diagnosis Date  . Allergy   . Anxiety   . Arthritis   . Barrett's esophagus determined by endoscopy   . Cancer [facility])    Uterine Cancer  . Chronic kidney disease   . Depression   . Diabetes mellitus without complication ([place])   . Difficult intubation    "not sure what happened,asleep was told had problem with intubation"  . Diverticulosis   . GERD (gastroesophageal reflux disease)   . History of bronchitis   . History of hiatal hernia   . Hyperlipidemia   . Hypertension   . Sleep apnea    Use C- PAP  . Urinary incontinence   . Varicose veins     Current Outpatient Medications  Medication Sig Dispense Refill  . ACCU-CHEK AVIVA PLUS test strip TEST TWO TIMES DAILY 200 each 1  . acetaminophen (TYLENOL) 500 MG tablet Take 2 tablets (1,000 mg total) by mouth every 6 (six) hours. (Patient taking differently: Take 1,000 mg every 6 (six) hours as needed by mouth. ) 65 tablet 1  . albuterol (PROVENTIL HFA;VENTOLIN HFA) 108 (90 BASE) MCG/ACT inhaler Inhale 2 puffs into the lungs every 6 (six) hours as needed for wheezing or shortness of breath.     . Ascorbic Acid (VITAMIN C) 1000 MG tablet Take 1,000 mg by mouth every evening.     Marland Kitchen aspirin EC 81 MG tablet Take 81 mg by mouth every evening.     Marland Kitchen atorvastatin (LIPITOR) 40 MG tablet Take 20 mg by mouth daily.    Marland Kitchen azelastine (ASTELIN) 0.1 % nasal spray Place 1 spray into both nostrils daily.    . Biotin 1000 MCG tablet  Take 1,000 mcg by mouth 2 (two) times daily.    . Calcium Carbonate-Vitamin D 300-100 MG-UNIT CAPS Take 500 mg by mouth at bedtime.     . Cholecalciferol (D3-1000 PO) Take 1 tablet by mouth daily.     Marland Kitchen CINNAMON PO Take 1,000 mg by mouth 2 (two) times daily.    Marland Kitchen docusate sodium (COLACE) 100 MG capsule Take 1 capsule (100 mg total) by mouth 2 (two) times daily. 20 capsule 0  . fish oil-omega-3 fatty acids 1000 MG capsule Take 1 g by mouth every evening.     . fluticasone (FLONASE) 50 MCG/ACT nasal spray Place 1 spray into both nostrils daily. 48 g 1  . furosemide (LASIX) 20 MG tablet TAKE 1 TABLET BY MOUTH  EVERY OTHER DAY 45 tablet 3  . glipiZIDE (GLUCOTROL XL) 5 MG 24 hr tablet TAKE 1 TABLET BY MOUTH  DAILY 90 tablet 1  . insulin detemir (LEVEMIR) 100 UNIT/ML injection Inject 0.2 mLs (20 Units total) into the skin at bedtime. 10 mL 0  . JANUVIA 100 MG tablet TAKE 1 TABLET BY MOUTH  DAILY 90 tablet 3  . Lancet Devices (ACCU-CHEK SOFTCLIX) lancets Use to test  blood sugars 1 -2 times daily 1 each 3  . letrozole (FEMARA) 2.5 MG tablet Take 1 tablet (2.5 mg total) by mouth daily. 90 tablet 3  . loratadine (CLARITIN) 10 MG tablet Take 10 mg by mouth daily.    Marland Kitchen losartan-hydrochlorothiazide (HYZAAR) 50-12.5 MG tablet TAKE 1 TABLET BY MOUTH  DAILY 90 tablet 1  . magnesium oxide (MAG-OX) 400 MG tablet Take 400 mg by mouth daily.    . meclizine (ANTIVERT) 25 MG tablet Take 25 mg by mouth 2 (two) times daily as needed for dizziness.    . metFORMIN (GLUCOPHAGE-XR) 750 MG 24 hr tablet TAKE 2 TABLETS BY MOUTH  DAILY WITH BREAKFAST 180 tablet 1  . Multiple Vitamin (MULTIVITAMIN) tablet Take 1 tablet by mouth daily.    . pantoprazole (PROTONIX) 40 MG tablet Take 1 tablet (40 mg total) by mouth daily. 90 tablet 2  . Potassium 99 MG TABS Take 1 tablet by mouth daily.    . Probiotic Product (PROBIOTIC-10) CAPS Take 1 capsule by mouth daily.     Marland Kitchen Propylene Glycol (SYSTANE BALANCE OP) Apply 2 drops daily to eye.  Both eyes    . sennosides-docusate sodium (SENOKOT-S) 8.6-50 MG tablet Take 1 tablet by mouth daily.    . sertraline (ZOLOFT) 50 MG tablet TAKE 1 TABLET BY MOUTH  DAILY 90 tablet 1  . verapamil (VERELAN PM) 180 MG 24 hr capsule TAKE 1 CAPSULE BY MOUTH  DAILY 90 capsule 1  . vitamin B-12 (CYANOCOBALAMIN) 1000 MCG tablet Take 1,000 mcg by mouth daily.     No current facility-administered medications for this visit.     Allergies  Allergen Reactions  . Clindamycin/Lincomycin Rash  . Codeine Rash  . Lincomycin Rash  . Morphine And Related Nausea Only  . Talwin [Pentazocine] Nausea And Vomiting  . Lisinopril Cough    cough  . Naloxone Other (See Comments)    Had issues with sedation and vomiting with EGD  . Oxycodone-Acetaminophen Other (See Comments)    Pt states this medication makes her bowels stop moving    Family History  Problem Relation Age of Onset  . Stroke Mother   . Hypertension Mother   . Lupus Mother   . Alcohol abuse Father   . Diabetes Father   . Cancer Father        gallbladder ca into liver   . Arthritis Sister   . Diabetes Sister   . Hyperlipidemia Daughter   . Hypertension Daughter   . Cancer Maternal Aunt        ovarian ca  . Diabetes Paternal Aunt   . Drug abuse Paternal Aunt   . Cancer Maternal Grandfather        colon ca  . Hypertension Brother     Social History   Socioeconomic History  . Marital status: Married    Spouse name: Not on file  . Number of children: 2  . Years of education: 12  . Highest education level: Some college, no degree  Occupational History  . Not on file  Social Needs  . Financial resource strain: Not on file  . Food insecurity:    Worry: Never true    Inability: Never true  . Transportation needs:    Medical: No    Non-medical: No  Tobacco Use  . Smoking status: Never Smoker  . Smokeless tobacco: Never Used  Substance and Sexual Activity  . Alcohol use: Yes    Comment: rare  . Drug use: No  .  Sexual  activity: Yes    Comment: not often  Lifestyle  . Physical activity:    Days per week: 3 days    Minutes per session: 60 min  . Stress: Not at all  Relationships  . Social connections:    Talks on phone: More than three times a week    Gets together: More than three times a week    Attends religious service: More than 4 times per year    Active member of club or organization: Yes    Attends meetings of clubs or organizations: More than 4 times per year    Relationship status: Married  . Intimate partner violence:    Fear of current or ex partner: No    Emotionally abused: No    Physically abused: No    Forced sexual activity: No  Other Topics Concern  . Not on file  Social History Narrative  . Not on file     Constitutional: Denies fever, malaise, fatigue, headache or abrupt weight changes.  Respiratory: Denies difficulty breathing, shortness of breath, cough or sputum production.   Cardiovascular: Denies chest pain, chest tightness, palpitations or swelling in the hands or feet.  Musculoskeletal: Pt reports left ankle pain. Denies decrease in range of motion, difficulty with gait, muscle pain or joint swelling.  Skin: Pt reports redness of left ankle. Denies rashes, lesions or ulcercations.    No other specific complaints in a complete review of systems (except as listed in HPI above).   Objective:   Physical Exam   BP 114/72   Pulse 64   Temp 98 F (36.7 C) (Oral)   Wt 168 lb (76.2 kg)   SpO2 98%   BMI 32.81 kg/m  Wt Readings from Last 3 Encounters:  02/28/18 168 lb (76.2 kg)  12/20/17 171 lb (77.6 kg)  12/12/17 171 lb (77.6 kg)    General: Appears her stated age, in NAD. Skin: Warm, dry and intact. 2 cm x 3 cm area of redness noted over the malleolus, left ankle. Musculoskeletal: Normal flexion, extension and rotation of the left ankle. 1+ swelling noted. No difficulty with gait.  Neurological: Alert and oriented. Sensation intact to LLE.  BMET      Component Value Date/Time   NA 141 09/27/2017 0926   NA 142 11/18/2014   K 3.8 09/27/2017 0926   CL 100 09/27/2017 0926   CO2 30 09/27/2017 0926   GLUCOSE 104 (H) 09/27/2017 0926   BUN 25 (H) 09/27/2017 0926   BUN 21 11/29/2016   CREATININE 1.05 09/27/2017 0926   CALCIUM 9.3 09/27/2017 0926   GFRNONAA 52 (L) 02/27/2017 1117   GFRAA 60 (L) 02/27/2017 1117    Lipid Panel     Component Value Date/Time   CHOL 175 09/27/2017 0926   TRIG 111.0 09/27/2017 0926   HDL 66.90 09/27/2017 0926   CHOLHDL 3 09/27/2017 0926   VLDL 22.2 09/27/2017 0926   LDLCALC 86 09/27/2017 0926    CBC    Component Value Date/Time   WBC 4.2 05/15/2017 1325   RBC 3.92 05/15/2017 1325   HGB 12.0 05/15/2017 1325   HCT 34.5 (L) 05/15/2017 1325   PLT 316 05/15/2017 1325   MCV 88.0 05/15/2017 1325   MCH 30.5 05/15/2017 1325   MCHC 34.7 05/15/2017 1325   RDW 15.3 (H) 05/15/2017 1325   LYMPHSABS 1.0 02/27/2017 1117   MONOABS 0.3 02/27/2017 1117   EOSABS 0.1 02/27/2017 1117   BASOSABS 0.0 02/27/2017 1117  Hgb A1C Lab Results  Component Value Date   HGBA1C 6.6 (H) 09/27/2017          Assessment & Plan:   Cellulitis, Left Ankle:  Encouraged elevation Warm compresses may be helpful eRx for Keflex 500 mg TID x 7 days  Return precautions discussed Webb Silversmith, NP

## 2018-02-28 NOTE — Patient Instructions (Signed)

## 2018-03-12 ENCOUNTER — Other Ambulatory Visit: Payer: Self-pay | Admitting: Internal Medicine

## 2018-03-27 ENCOUNTER — Telehealth: Payer: Self-pay | Admitting: *Deleted

## 2018-03-27 ENCOUNTER — Ambulatory Visit: Payer: Medicare Other | Admitting: Internal Medicine

## 2018-03-27 ENCOUNTER — Encounter: Payer: Self-pay | Admitting: Internal Medicine

## 2018-03-27 VITALS — BP 128/76 | HR 67 | Temp 97.8°F | Resp 15 | Ht 60.0 in | Wt 167.8 lb

## 2018-03-27 DIAGNOSIS — I10 Essential (primary) hypertension: Secondary | ICD-10-CM | POA: Diagnosis not present

## 2018-03-27 DIAGNOSIS — N183 Chronic kidney disease, stage 3 unspecified: Secondary | ICD-10-CM

## 2018-03-27 DIAGNOSIS — Z794 Long term (current) use of insulin: Secondary | ICD-10-CM | POA: Diagnosis not present

## 2018-03-27 DIAGNOSIS — M2142 Flat foot [pes planus] (acquired), left foot: Secondary | ICD-10-CM

## 2018-03-27 DIAGNOSIS — E669 Obesity, unspecified: Secondary | ICD-10-CM

## 2018-03-27 DIAGNOSIS — M2141 Flat foot [pes planus] (acquired), right foot: Secondary | ICD-10-CM

## 2018-03-27 DIAGNOSIS — E1169 Type 2 diabetes mellitus with other specified complication: Secondary | ICD-10-CM

## 2018-03-27 DIAGNOSIS — E785 Hyperlipidemia, unspecified: Secondary | ICD-10-CM | POA: Diagnosis not present

## 2018-03-27 DIAGNOSIS — M79671 Pain in right foot: Secondary | ICD-10-CM | POA: Diagnosis not present

## 2018-03-27 DIAGNOSIS — M79672 Pain in left foot: Secondary | ICD-10-CM

## 2018-03-27 DIAGNOSIS — E1121 Type 2 diabetes mellitus with diabetic nephropathy: Secondary | ICD-10-CM | POA: Diagnosis not present

## 2018-03-27 LAB — COMPREHENSIVE METABOLIC PANEL
ALT: 15 U/L (ref 0–35)
AST: 19 U/L (ref 0–37)
Albumin: 4.1 g/dL (ref 3.5–5.2)
Alkaline Phosphatase: 85 U/L (ref 39–117)
BUN: 20 mg/dL (ref 6–23)
CO2: 31 mEq/L (ref 19–32)
Calcium: 9.5 mg/dL (ref 8.4–10.5)
Chloride: 102 mEq/L (ref 96–112)
Creatinine, Ser: 0.91 mg/dL (ref 0.40–1.20)
GFR: 64.23 mL/min (ref >60.00)
Glucose, Bld: 88 mg/dL (ref 70–99)
Potassium: 3.8 mEq/L (ref 3.5–5.1)
Sodium: 141 mEq/L (ref 135–145)
Total Bilirubin: 0.4 mg/dL (ref 0.2–1.2)
Total Protein: 7.1 g/dL (ref 6.0–8.3)

## 2018-03-27 LAB — HEMOGLOBIN A1C: Hgb A1c MFr Bld: 6.3 % (ref 4.6–6.5)

## 2018-03-27 LAB — LIPID PANEL
Cholesterol: 215 mg/dL — ABNORMAL HIGH (ref 0–200)
HDL: 83.8 mg/dL (ref >39.00)
LDL Cholesterol: 108 mg/dL — ABNORMAL HIGH (ref 0–99)
NonHDL: 130.77
Total CHOL/HDL Ratio: 3
Triglycerides: 116 mg/dL (ref 0.0–149.0)
VLDL: 23.2 mg/dL (ref 0.0–40.0)

## 2018-03-27 NOTE — Telephone Encounter (Signed)
Copied from [place] [id number]. Topic: Inquiry >> Mar 27, 2018  3:20 PM Mylinda Latina, NT wrote: Reason for CRM: patient states she was talking to the provider and gave her the wrong name for a eye center. The correct name is My eye doctor .

## 2018-03-27 NOTE — Progress Notes (Signed)
Subjective:  Patient ID: Sandra Hendrix, female    DOB: 06-29-1946  Age: 72 y.o. MRN: [id number]  CC: The primary encounter diagnosis was Foot pain, bilateral. Diagnoses of Hyperlipidemia LDL goal <100, Type 2 diabetes mellitus with diabetic nephropathy, with long-term current use of insulin ([place]), CKD (chronic kidney disease) stage 3, GFR 30-59 ml/min (HCC), Hyperlipidemia associated with type 2 diabetes mellitus ([place]), Essential hypertension, Obesity (BMI 30.0-34.9), and Bilateral pes planus were also pertinent to this visit.  [name] presents for 6 month follow up on diabetes.  Patient has no complaints today.  Patient is following a low glycemic index diet and taking all prescribed medications regularly without side effects.  Fasting sugars have been under less than 140 most of the time and post prandials have been under 130 except on rare occasions. Patient is exercising about 3 times per week and intentionally trying to lose weight , but is bothered by bilateral foot pain located in the mid foot and ankle.  She has a history of ankle fracture and flat feet. She has tried using OTC shoe inserts to supprt her arch with no significant change in pain.   .  Patient has had an eye exam in the last 12 months and checks feet regularly for signs of infection.  Patient does not walk barefoot outside,  And denies an numbness tingling or burning in feet. Patient is up to date on all recommended vaccinations  Outpatient Medications Prior to Visit  Medication Sig Dispense Refill  . ACCU-CHEK AVIVA PLUS test strip TEST TWO TIMES DAILY 200 each 1  . acetaminophen (TYLENOL) 500 MG tablet Take 2 tablets (1,000 mg total) by mouth every 6 (six) hours. (Patient taking differently: Take 1,000 mg every 6 (six) hours as needed by mouth. ) 65 tablet 1  . albuterol (PROVENTIL HFA;VENTOLIN HFA) 108 (90 BASE) MCG/ACT inhaler Inhale 2 puffs into the lungs every 6 (six) hours as needed for wheezing or  shortness of breath.     . Ascorbic Acid (VITAMIN C) 1000 MG tablet Take 1,000 mg by mouth every evening.     Marland Kitchen aspirin EC 81 MG tablet Take 81 mg by mouth every evening.     Marland Kitchen atorvastatin (LIPITOR) 40 MG tablet Take 20 mg by mouth daily.    Marland Kitchen azelastine (ASTELIN) 0.1 % nasal spray Place 1 spray into both nostrils daily.    . Biotin 1000 MCG tablet Take 1,000 mcg by mouth 2 (two) times daily.    . Calcium Carbonate-Vitamin D 300-100 MG-UNIT CAPS Take 500 mg by mouth at bedtime.     . cephALEXin (KEFLEX) 500 MG capsule Take 1 capsule (500 mg total) by mouth 3 (three) times daily. 21 capsule 0  . Cholecalciferol (D3-1000 PO) Take 1 tablet by mouth daily.     Verneita Griffes Bark POWD Take by mouth.    Marland Kitchen CINNAMON PO Take 1,000 mg by mouth 2 (two) times daily.    Marland Kitchen docusate sodium (COLACE) 100 MG capsule Take 1 capsule (100 mg total) by mouth 2 (two) times daily. 20 capsule 0  . fish oil-omega-3 fatty acids 1000 MG capsule Take 1 g by mouth every evening.     . fluticasone (FLONASE) 50 MCG/ACT nasal spray Place 1 spray into both nostrils daily. 48 g 1  . furosemide (LASIX) 20 MG tablet TAKE 1 TABLET BY MOUTH  EVERY OTHER DAY 45 tablet 3  . glipiZIDE (GLUCOTROL XL) 5 MG 24 hr tablet TAKE 1  TABLET BY MOUTH  DAILY 90 tablet 1  . insulin detemir (LEVEMIR) 100 UNIT/ML injection Inject 0.2 mLs (20 Units total) into the skin at bedtime. 10 mL 0  . JANUVIA 100 MG tablet TAKE 1 TABLET BY MOUTH  DAILY 90 tablet 3  . Lancet Devices (ACCU-CHEK SOFTCLIX) lancets Use to test blood sugars 1 -2 times daily 1 each 3  . letrozole (FEMARA) 2.5 MG tablet Take 1 tablet (2.5 mg total) by mouth daily. 90 tablet 3  . loratadine (CLARITIN) 10 MG tablet Take 10 mg by mouth daily.    Marland Kitchen losartan-hydrochlorothiazide (HYZAAR) 50-12.5 MG tablet TAKE 1 TABLET BY MOUTH  DAILY 90 tablet 1  . magnesium oxide (MAG-OX) 400 MG tablet Take 400 mg by mouth daily.    . meclizine (ANTIVERT) 25 MG tablet Take 25 mg by mouth 2 (two) times daily  as needed for dizziness.    . metFORMIN (GLUCOPHAGE-XR) 750 MG 24 hr tablet TAKE 2 TABLETS BY MOUTH  DAILY WITH BREAKFAST 180 tablet 1  . Multiple Vitamin (MULTIVITAMIN) tablet Take 1 tablet by mouth daily.    . pantoprazole (PROTONIX) 40 MG tablet Take 1 tablet (40 mg total) by mouth daily. 90 tablet 2  . Potassium 99 MG TABS Take 1 tablet by mouth daily.    . Probiotic Product (PROBIOTIC-10) CAPS Take 1 capsule by mouth daily.     Marland Kitchen Propylene Glycol (SYSTANE BALANCE OP) Apply 2 drops daily to eye. Both eyes    . sennosides-docusate sodium (SENOKOT-S) 8.6-50 MG tablet Take 1 tablet by mouth daily.    . sertraline (ZOLOFT) 50 MG tablet TAKE 1 TABLET BY MOUTH  DAILY 90 tablet 1  . verapamil (VERELAN PM) 180 MG 24 hr capsule TAKE 1 CAPSULE BY MOUTH  DAILY 90 capsule 1  . vitamin B-12 (CYANOCOBALAMIN) 1000 MCG tablet Take 1,000 mcg by mouth daily.     No facility-administered medications prior to visit.     Review of Systems;  Patient denies headache, fevers, malaise, unintentional weight loss, skin rash, eye pain, sinus congestion and sinus pain, sore throat, dysphagia,  hemoptysis , cough, dyspnea, wheezing, chest pain, palpitations, orthopnea, edema, abdominal pain, nausea, melena, diarrhea, constipation, flank pain, dysuria, hematuria, urinary  Frequency, nocturia, numbness, tingling, seizures,  Focal weakness, Loss of consciousness,  Tremor, insomnia, depression, anxiety, and suicidal ideation.      Objective:  BP 128/76 (BP Location: Left Arm, Patient Position: Sitting, Cuff Size: Normal)   Pulse 67   Temp 97.8 F (36.6 C) (Oral)   Resp 15   Ht 5' (1.524 m)   Wt 167 lb 12.8 oz (76.1 kg)   SpO2 96%   BMI 32.77 kg/m   BP Readings from Last 3 Encounters:  03/27/18 128/76  02/28/18 114/72  12/20/17 103/70    Wt Readings from Last 3 Encounters:  03/27/18 167 lb 12.8 oz (76.1 kg)  02/28/18 168 lb (76.2 kg)  12/20/17 171 lb (77.6 kg)    General appearance: alert, cooperative  and appears stated age Ears: normal TM's and external ear canals both ears Throat: lips, mucosa, and tongue normal; teeth and gums normal Neck: no adenopathy, no carotid bruit, supple, symmetrical, trachea midline and thyroid not enlarged, symmetric, no tenderness/mass/nodules Back: symmetric, no curvature. ROM normal. No CVA tenderness. Lungs: clear to auscultation bilaterally Heart: regular rate and rhythm, S1, S2 normal, no murmur, click, rub or gallop Abdomen: soft, non-tender; bowel sounds normal; no masses,  no organomegaly Pulses: 2+ and symmetric Skin: Skin  color, texture, turgor normal. No rashes or lesions Lymph nodes: Cervical, supraclavicular, and axillary nodes normal.  Lab Results  Component Value Date   HGBA1C 6.3 03/27/2018   HGBA1C 6.6 (H) 09/27/2017   HGBA1C 6.3 06/19/2017    Lab Results  Component Value Date   CREATININE 0.91 03/27/2018   CREATININE 1.05 09/27/2017   CREATININE 1.05 (H) 02/27/2017    Lab Results  Component Value Date   WBC 4.2 05/15/2017   HGB 12.0 05/15/2017   HCT 34.5 (L) 05/15/2017   PLT 316 05/15/2017   GLUCOSE 88 03/27/2018   CHOL 215 (H) 03/27/2018   TRIG 116.0 03/27/2018   HDL 83.80 03/27/2018   LDLDIRECT 80.0 02/22/2017   LDLCALC 108 (H) 03/27/2018   ALT 15 03/27/2018   AST 19 03/27/2018   NA 141 03/27/2018   K 3.8 03/27/2018   CL 102 03/27/2018   CREATININE 0.91 03/27/2018   BUN 20 03/27/2018   CO2 31 03/27/2018   TSH 2.69 11/07/2014   INR 1.1 01/23/2009   HGBA1C 6.3 03/27/2018   MICROALBUR 1.0 09/27/2017    No results found.  Assessment & Plan:   Problem List Items Addressed This Visit    Hyperlipidemia LDL goal <100   Relevant Orders   Lipid panel (Completed)   DM (diabetes mellitus), type 2 with renal complications ([place]) (Chronic)    Currently well-controlled on current medications .  hemoglobin A1c is at goal of less than 7.0 . Patient is reminded to schedule an annual eye exam and foot exam is normal today.  Patient has no microalbuminuria. Patient is tolerating statin therapy for CAD risk reduction and on ACE/ARB for renal protection and hypertension   Lab Results  Component Value Date   HGBA1C 6.3 03/27/2018   Lab Results  Component Value Date   MICROALBUR 1.0 09/27/2017   Lab Results  Component Value Date   CREATININE 0.91 03/27/2018         Relevant Orders   Hemoglobin A1c (Completed)   Comprehensive metabolic panel (Completed)   Essential hypertension    Well controlled on current regimen. Renal function stable, no changes today.  Lab Results  Component Value Date   CREATININE 0.91 03/27/2018   Lab Results  Component Value Date   NA 141 03/27/2018   K 3.8 03/27/2018   CL 102 03/27/2018   CO2 31 03/27/2018         CKD (chronic kidney disease) stage 3, GFR 30-59 ml/min (H[facility])    Stable, Monitored semi annually by CCK.  GFR has been stable at 60 ml/min  Workup was notable only for a congenitally small right kidney   By U/S.  Lab Results  Component Value Date   MICROALBUR 1.0 09/27/2017   Lab Results  Component Value Date   CREATININE 0.91 03/27/2018         Obesity (BMI 30.0-34.9)    I have congratulated her in controlling her diabetes,  Addressed her  BMI and encouraged  Continued weight loss with goal of 10% of body weigh over the next 6 months using a low glycemic index diet and regular exercise a minimum of 5 days per week.        Hyperlipidemia associated with type 2 diabetes mellitus ([facility])    LDL and triglycerides are at goal on current medications. She has no side effects and liver enzymes are normal. No changes today.  Lab Results  Component Value Date   CHOL 215 (H) 03/27/2018   HDL 83.80  03/27/2018   LDLCALC 108 (H) 03/27/2018   LDLDIRECT 80.0 02/22/2017   TRIG 116.0 03/27/2018   CHOLHDL 3 03/27/2018   Lab Results  Component Value Date   ALT 15 03/27/2018   AST 19 03/27/2018   ALKPHOS 85 03/27/2018   BILITOT 0.4 03/27/2018           Bilateral pes planus    With pain that limits her ability to exercise.  Referral to podiatry in progress        Other Visit Diagnoses    Foot pain, bilateral    -  Primary   Relevant Orders   Ambulatory referral to Podiatry      I am having Geryl P. Naim maintain her Cholecalciferol (D3-1000 PO), multivitamin, fish oil-omega-3 fatty acids, albuterol, aspirin EC, PROBIOTIC-10, vitamin C, CINNAMON PO, loratadine, azelastine, Biotin, acetaminophen, docusate sodium, meclizine, accu-chek softclix, Calcium Carbonate-Vitamin D, magnesium oxide, sennosides-docusate sodium, furosemide, Propylene Glycol (SYSTANE BALANCE OP), fluticasone, glipiZIDE, ACCU-CHEK AVIVA PLUS, pantoprazole, vitamin B-12, Potassium, letrozole, JANUVIA, verapamil, sertraline, metFORMIN, insulin detemir, atorvastatin, cephALEXin, losartan-hydrochlorothiazide, and Cinnamon Bark.  No orders of the defined types were placed in this encounter.   There are no discontinued medications.  Follow-up: Return in about 6 months (around 09/27/2018) for CPe, follow up diabetes.   Crecencio Mc, MD

## 2018-03-27 NOTE — Telephone Encounter (Signed)
This is a [employer]. Will route to their [facility] Pool

## 2018-03-27 NOTE — Patient Instructions (Addendum)
We need proof of your eye exam for medicare from [facility] for Medicare   I have made the referral to Dr Milinda Pointer for your foot pain    Flat Feet, Adult Normally, a foot has a curve, called an arch, on its inner side. The arch creates a gap between the foot and the ground. Flat feet is a common condition in which one or both feet do not have an arch. What are the causes? This condition may be caused by:  Failure of a normal arch to develop during childhood.  An injury to tendons and ligaments in the foot, such as to the tendon that supports the arch (posterior tibial tendon).  Loose tendons or ligaments in the foot.  A wearing down of the arch over time.  Injury to bones in the foot.  An abnormality in the bones of the foot, called tarsal coalition. This happens when two or more bones in the foot are joined together (fused) before birth.  What increases the risk? This condition is more likely to develop in:  Females.  Adults age 66 or older.  People who: ? Have a family history of flat feet. ? Have a history of childhood flexible flatfoot. ? Are obese. ? Have diabetes. ? Have high blood pressure. ? Participate in high-impact sports. ? Have inflammatory arthritis. ? Have a history of broken (fractured) or dislocated bones in the foot.  What are the signs or symptoms? Symptoms of this condition include:  Pain or tightness along the bottom of the foot.  Foot pain that gets worse with activity.  Swelling of the inner side of the foot.  Swelling of the ankle.  Pain on the outer side of the ankle.  Changes in the way that you walk (gait).  Pronation. This is when the foot and ankle lean inward when you are standing.  Bony bumps on the top or inner side of the foot.  How is this diagnosed? This condition is diagnosed with a physical exam of your foot and ankle. Your health care provider may also:  Look at your shoes for patterns of wear on the  soles.  Order imaging tests, such as X-rays, a CT scan, or an MRI.  Refer you to a health care provider who specializes in feet (podiatrist) or a physical therapist.  How is this treated? This condition may be treated with:  Stretching exercises or physical therapy. This helps to increase range of motion and relieve pain.  A shoe insert (orthotic). This helps to support the arch of your foot. Orthotics can be purchased from a store or can be custom-made by your health care provider.  Wearing shoes with appropriate arch support. This is especially important for athletes.  Medicines. These may be prescribed to relieve pain.  An ankle brace, boot, or cast. These may be used to relieve pressure on your foot. You may be given crutches if walking is painful.  Surgery. This may be done to improve the alignment of your foot. This is only needed if your posterior tibial tendon is torn or if you have tarsal coalition.  Follow these instructions at home: Activity  Do any exercises as told by your health care provider.  If an activity causes pain, avoid it or try to find another activity that does not cause pain. General instructions  Wear orthotics and appropriate shoes as told by your health care provider.  Take over-the-counter and prescription medicines only as told by your health care provider.  Wear an ankle brace, boot, or cast as told by your health care provider.  Use crutches as told by your health care provider.  Keep all follow-up visits as told by your health care provider. This is important. How is this prevented? To prevent the condition from getting worse:  Wear comfortable, supportive shoes that are appropriate for your activities.  Maintain a healthy weight.  Stay active in a way that your health care provider recommends. This will help to keep your feet flexible and strong.  Manage long-term (chronic) health conditions, such as diabetes, high blood pressure, and  inflammatory arthritis.  Work with a health care provider if you have concerns about your feet or shoes.  Contact a health care provider if:  You have pain in your foot or lower leg that gets worse or does not improve with medicine.  You have pain or difficulty when walking.  You have problems with your orthotics. Summary  Flat feet is a common condition in which one or both feet do not have a curve, called an arch, on the inner side.  Your health care provider may recommend a shoe insert (orthotic) or shoes with the appropriate arch support.  Other treatments may include stretching exercises or physical therapy, medicines to relieve pain, and wearing an ankle brace, boot, or cast.  Surgery may be done if you have a tear in the tendon that supports your arch (posterior tibial tendon) or if two or more of your foot bones were joined together (fused)  before birth (tarsal coalition). This information is not intended to replace advice given to you by your health care provider. Make sure you discuss any questions you have with your health care provider. Document Released: 07/10/2009 Document Revised: 11/23/2016 Document Reviewed: 11/23/2016 Elsevier Interactive Patient Education  [employer].

## 2018-03-28 NOTE — Telephone Encounter (Signed)
Did seh sign a medical release form for her eye doctor? We need her diabetic eye exam documentation

## 2018-03-28 NOTE — Telephone Encounter (Signed)
Corrected name of [facility] is [facility].

## 2018-03-29 DIAGNOSIS — M2141 Flat foot [pes planus] (acquired), right foot: Secondary | ICD-10-CM | POA: Insufficient documentation

## 2018-03-29 DIAGNOSIS — E1169 Type 2 diabetes mellitus with other specified complication: Secondary | ICD-10-CM | POA: Insufficient documentation

## 2018-03-29 DIAGNOSIS — E785 Hyperlipidemia, unspecified: Secondary | ICD-10-CM

## 2018-03-29 DIAGNOSIS — M2142 Flat foot [pes planus] (acquired), left foot: Secondary | ICD-10-CM | POA: Insufficient documentation

## 2018-03-29 NOTE — Assessment & Plan Note (Signed)
Currently well-controlled on current medications .  hemoglobin A1c is at goal of less than 7.0 . Patient is reminded to schedule an annual eye exam and foot exam is normal today. Patient has no microalbuminuria. Patient is tolerating statin therapy for CAD risk reduction and on ACE/ARB for renal protection and hypertension   Lab Results  Component Value Date   HGBA1C 6.3 03/27/2018   Lab Results  Component Value Date   MICROALBUR 1.0 09/27/2017   Lab Results  Component Value Date   CREATININE 0.91 03/27/2018

## 2018-03-29 NOTE — Assessment & Plan Note (Signed)
LDL and triglycerides are at goal on current medications. She has no side effects and liver enzymes are normal. No changes today.  Lab Results  Component Value Date   CHOL 215 (H) 03/27/2018   HDL 83.80 03/27/2018   LDLCALC 108 (H) 03/27/2018   LDLDIRECT 80.0 02/22/2017   TRIG 116.0 03/27/2018   CHOLHDL 3 03/27/2018   Lab Results  Component Value Date   ALT 15 03/27/2018   AST 19 03/27/2018   ALKPHOS 85 03/27/2018   BILITOT 0.4 03/27/2018

## 2018-03-29 NOTE — Assessment & Plan Note (Signed)
Stable, Monitored semi annually by [facility].  GFR has been stable at 60 ml/min  Workup was notable only for a congenitally small right kidney   By U/S.  Lab Results  Component Value Date   MICROALBUR 1.0 09/27/2017   Lab Results  Component Value Date   CREATININE 0.91 03/27/2018

## 2018-03-29 NOTE — Assessment & Plan Note (Signed)
I have congratulated her in controlling her diabetes,  Addressed her  BMI and encouraged  Continued weight loss with goal of 10% of body weigh over the next 6 months using a low glycemic index diet and regular exercise a minimum of 5 days per week.

## 2018-03-29 NOTE — Assessment & Plan Note (Signed)
Well controlled on current regimen. Renal function stable, no changes today.  Lab Results  Component Value Date   CREATININE 0.91 03/27/2018   Lab Results  Component Value Date   NA 141 03/27/2018   K 3.8 03/27/2018   CL 102 03/27/2018   CO2 31 03/27/2018

## 2018-03-29 NOTE — Assessment & Plan Note (Signed)
With pain that limits her ability to exercise.  Referral to podiatry in progress

## 2018-03-30 NOTE — Telephone Encounter (Signed)
Medical record release has been corrected and faxed.

## 2018-04-05 ENCOUNTER — Encounter: Payer: Self-pay | Admitting: Internal Medicine

## 2018-04-25 ENCOUNTER — Ambulatory Visit (INDEPENDENT_AMBULATORY_CARE_PROVIDER_SITE_OTHER): Payer: Medicare Other

## 2018-04-25 ENCOUNTER — Encounter: Payer: Self-pay | Admitting: Podiatry

## 2018-04-25 ENCOUNTER — Ambulatory Visit: Payer: Medicare Other | Admitting: Podiatry

## 2018-04-25 DIAGNOSIS — M722 Plantar fascial fibromatosis: Secondary | ICD-10-CM | POA: Diagnosis not present

## 2018-04-25 NOTE — Progress Notes (Signed)
Subjective:  Patient ID: Sandra Hendrix, female    DOB: [birth year],  MRN: [id number] HPI Chief Complaint  Patient presents with  . Foot Pain    Lateral foot and ankle bilateral - flat feet x years, more pain now when walking  . New Patient (Initial Visit)    Est pt - 2017    72 y.o. female presents with the above complaint.   ROS: Denies fever chills nausea vomiting muscle aches pains calf pain back pain chest pain shortness of breath.  Past Medical History:  Diagnosis Date  . Allergy   . Anxiety   . Arthritis   . Barrett's esophagus determined by endoscopy   . Cancer [facility])    Uterine Cancer  . Chronic kidney disease   . Depression   . Diabetes mellitus without complication ([place])   . Difficult intubation    "not sure what happened,asleep was told had problem with intubation"  . Diverticulosis   . GERD (gastroesophageal reflux disease)   . History of bronchitis   . History of hiatal hernia   . Hyperlipidemia   . Hypertension   . Sleep apnea    Use C- PAP  . Urinary incontinence   . Varicose veins    Past Surgical History:  Procedure Laterality Date  . ABDOMINAL HYSTERECTOMY  03/01/2017  . CHOLECYSTECTOMY    . COLONOSCOPY WITH PROPOFOL N/A 06/14/2016   Procedure: COLONOSCOPY WITH PROPOFOL;  Surgeon: Lollie Sails, MD;  Location: [facility] ENDOSCOPY;  Service: Endoscopy;  Laterality: N/A;  . ESOPHAGOGASTRODUODENOSCOPY (EGD) WITH PROPOFOL N/A 06/14/2016   Procedure: ESOPHAGOGASTRODUODENOSCOPY (EGD) WITH PROPOFOL;  Surgeon: Lollie Sails, MD;  Location: [facility] ENDOSCOPY;  Service: Endoscopy;  Laterality: N/A;  . ESOPHAGOGASTRODUODENOSCOPY (EGD) WITH PROPOFOL N/A 06/12/2017   Procedure: ESOPHAGOGASTRODUODENOSCOPY (EGD) WITH PROPOFOL;  Surgeon: Lollie Sails, MD;  Location: [facility] ENDOSCOPY;  Service: Endoscopy;  Laterality: N/A;  . ESOPHAGOGASTRODUODENOSCOPY (EGD) WITH PROPOFOL N/A 08/25/2017   Procedure: ESOPHAGOGASTRODUODENOSCOPY (EGD) WITH PROPOFOL;  Surgeon:  Lollie Sails, MD;  Location: [facility] ENDOSCOPY;  Service: Endoscopy;  Laterality: N/A;  . HYSTEROSCOPY W/D&C N/A 01/02/2017   Procedure: DILATATION AND CURETTAGE /HYSTEROSCOPY;  Surgeon: Brayton Mars, MD;  Location: [facility] ORS;  Service: Gynecology;  Laterality: N/A;  . JOINT REPLACEMENT Right 2007   Total Knee Replacement  . JOINT REPLACEMENT Left 2004   Total Knee Replacement  . LAPAROSCOPIC HYSTERECTOMY Bilateral 03/01/2017   Procedure: HYSTERECTOMY TOTAL LAPAROSCOPIC BSO;  Surgeon: Mellody Drown, MD;  Location: [facility] ORS;  Service: Gynecology;  Laterality: Bilateral;  . ROTATOR CUFF REPAIR Right   . SENTINEL NODE BIOPSY N/A 03/01/2017   Procedure: SENTINEL NODE INJECTION AND BIOPSY;  Surgeon: Mellody Drown, MD;  Location: [facility] ORS;  Service: Gynecology;  Laterality: N/A;  . TONSILLECTOMY     as a child  . TUBAL LIGATION    . UPPER GI ENDOSCOPY     x2    Current Outpatient Medications:  .  ACCU-CHEK AVIVA PLUS test strip, TEST TWO TIMES DAILY, Disp: 200 each, Rfl: 1 .  acetaminophen (TYLENOL) 500 MG tablet, Take 2 tablets (1,000 mg total) by mouth every 6 (six) hours. (Patient taking differently: Take 1,000 mg every 6 (six) hours as needed by mouth. ), Disp: 65 tablet, Rfl: 1 .  albuterol (PROVENTIL HFA;VENTOLIN HFA) 108 (90 BASE) MCG/ACT inhaler, Inhale 2 puffs into the lungs every 6 (six) hours as needed for wheezing or shortness of breath. , Disp: , Rfl:  .  Ascorbic Acid (VITAMIN C) 1000  MG tablet, Take 1,000 mg by mouth every evening. , Disp: , Rfl:  .  aspirin EC 81 MG tablet, Take 81 mg by mouth every evening. , Disp: , Rfl:  .  atorvastatin (LIPITOR) 40 MG tablet, Take 20 mg by mouth daily., Disp: , Rfl:  .  azelastine (ASTELIN) 0.1 % nasal spray, Place 1 spray into both nostrils daily., Disp: , Rfl:  .  Biotin 1000 MCG tablet, Take 1,000 mcg by mouth 2 (two) times daily., Disp: , Rfl:  .  Calcium Carbonate-Vitamin D 300-100 MG-UNIT CAPS, Take 500 mg by mouth at  bedtime. , Disp: , Rfl:  .  Cholecalciferol (D3-1000 PO), Take 1 tablet by mouth daily. , Disp: , Rfl:  .  Cinnamon Bark POWD, Take by mouth., Disp: , Rfl:  .  docusate sodium (COLACE) 100 MG capsule, Take 1 capsule (100 mg total) by mouth 2 (two) times daily., Disp: 20 capsule, Rfl: 0 .  fish oil-omega-3 fatty acids 1000 MG capsule, Take 1 g by mouth every evening. , Disp: , Rfl:  .  fluticasone (FLONASE) 50 MCG/ACT nasal spray, Place 1 spray into both nostrils daily., Disp: 48 g, Rfl: 1 .  furosemide (LASIX) 20 MG tablet, TAKE 1 TABLET BY MOUTH  EVERY OTHER DAY, Disp: 45 tablet, Rfl: 3 .  glipiZIDE (GLUCOTROL XL) 5 MG 24 hr tablet, TAKE 1 TABLET BY MOUTH  DAILY, Disp: 90 tablet, Rfl: 1 .  insulin detemir (LEVEMIR) 100 UNIT/ML injection, Inject 0.2 mLs (20 Units total) into the skin at bedtime., Disp: 10 mL, Rfl: 0 .  JANUVIA 100 MG tablet, TAKE 1 TABLET BY MOUTH  DAILY, Disp: 90 tablet, Rfl: 3 .  Lancet Devices (ACCU-CHEK SOFTCLIX) lancets, Use to test blood sugars 1 -2 times daily, Disp: 1 each, Rfl: 3 .  letrozole (FEMARA) 2.5 MG tablet, Take 1 tablet (2.5 mg total) by mouth daily., Disp: 90 tablet, Rfl: 3 .  loratadine (CLARITIN) 10 MG tablet, Take 10 mg by mouth daily., Disp: , Rfl:  .  losartan-hydrochlorothiazide (HYZAAR) 50-12.5 MG tablet, TAKE 1 TABLET BY MOUTH  DAILY, Disp: 90 tablet, Rfl: 1 .  magnesium oxide (MAG-OX) 400 MG tablet, Take 400 mg by mouth daily., Disp: , Rfl:  .  meclizine (ANTIVERT) 25 MG tablet, Take 25 mg by mouth 2 (two) times daily as needed for dizziness., Disp: , Rfl:  .  metFORMIN (GLUCOPHAGE-XR) 750 MG 24 hr tablet, TAKE 2 TABLETS BY MOUTH  DAILY WITH BREAKFAST, Disp: 180 tablet, Rfl: 1 .  Multiple Vitamin (MULTIVITAMIN) tablet, Take 1 tablet by mouth daily., Disp: , Rfl:  .  pantoprazole (PROTONIX) 40 MG tablet, Take 1 tablet (40 mg total) by mouth daily., Disp: 90 tablet, Rfl: 2 .  Potassium 99 MG TABS, Take 1 tablet by mouth daily., Disp: , Rfl:  .   Probiotic Product (PROBIOTIC-10) CAPS, Take 1 capsule by mouth daily. , Disp: , Rfl:  .  Propylene Glycol (SYSTANE BALANCE OP), Apply 2 drops daily to eye. Both eyes, Disp: , Rfl:  .  sertraline (ZOLOFT) 50 MG tablet, TAKE 1 TABLET BY MOUTH  DAILY, Disp: 90 tablet, Rfl: 1 .  verapamil (VERELAN PM) 180 MG 24 hr capsule, TAKE 1 CAPSULE BY MOUTH  DAILY, Disp: 90 capsule, Rfl: 1 .  vitamin B-12 (CYANOCOBALAMIN) 1000 MCG tablet, Take 1,000 mcg by mouth daily., Disp: , Rfl:   Allergies  Allergen Reactions  . Clindamycin/Lincomycin Rash  . Codeine Rash  . Lincomycin Rash  . Morphine  And Related Nausea Only  . Talwin [Pentazocine] Nausea And Vomiting  . Lisinopril Cough    cough  . Naloxone Other (See Comments)    Had issues with sedation and vomiting with EGD  . Oxycodone-Acetaminophen Other (See Comments)    Pt states this medication makes her bowels stop moving   Review of Systems Objective:  There were no vitals filed for this visit.  General: Well developed, nourished, in no acute distress, alert and oriented x3   Dermatological: Skin is warm, dry and supple bilateral. Nails x 10 are well maintained; remaining integument appears unremarkable at this time. There are no open sores, no preulcerative lesions, no rash or signs of infection present.  Vascular: Dorsalis Pedis artery and Posterior Tibial artery pedal pulses are 2/4 bilateral with immedate capillary fill time. Pedal hair growth present. No varicosities and no lower extremity edema present bilateral.   Neruologic: Grossly intact via light touch bilateral. Vibratory intact via tuning fork bilateral. Protective threshold with Semmes Wienstein monofilament intact to all pedal sites bilateral. Patellar and Achilles deep tendon reflexes 2+ bilateral. No Babinski or clonus noted bilateral.   Musculoskeletal: No gross boney pedal deformities bilateral. No pain, crepitus, or limitation noted with foot and ankle range of motion bilateral.  Muscular strength 5/5 in all groups tested bilateral.  Severe flatfoot deformity with posterior tibial tendon dysfunction left foot.  She has pain on palpation medial calcaneal tubercle of the right heel.  She also has pes planus bilateral.  Gait: Unassisted, Nonantalgic.    Radiographs:  Radiographs taken today demonstrate severe to moderate pes planus no acute findings.  Assessment & Plan:   Assessment: Posterior tibial tendon dysfunction with pes planus bilateral.  Plan: Discussed etiology pathology conservative versus surgical therapies.  Offered her a cortisone injection for the right foot she declined.  At this point I think getting her pair of orthotics built for her shoes going help her more than anything with the posterior tibial dysfunction as well as the fasciitis.     Max T. Acequia, [place]

## 2018-04-28 ENCOUNTER — Other Ambulatory Visit: Payer: Self-pay | Admitting: Internal Medicine

## 2018-04-30 ENCOUNTER — Ambulatory Visit: Payer: Medicare Other | Admitting: Radiation Oncology

## 2018-05-14 ENCOUNTER — Ambulatory Visit: Payer: Medicare Other | Admitting: Radiation Oncology

## 2018-05-16 ENCOUNTER — Other Ambulatory Visit: Payer: Medicare Other | Admitting: Orthotics

## 2018-05-17 ENCOUNTER — Ambulatory Visit
Admission: RE | Admit: 2018-05-17 | Discharge: 2018-05-17 | Disposition: A | Discharge status: Home / Self Care | Payer: Medicare Other | Source: Ambulatory Visit | Attending: Radiation Oncology | Admitting: Radiation Oncology

## 2018-05-17 ENCOUNTER — Encounter: Payer: Self-pay | Admitting: Radiation Oncology

## 2018-05-17 ENCOUNTER — Other Ambulatory Visit: Payer: Self-pay

## 2018-05-17 VITALS — BP 125/78 | HR 65 | Temp 97.5°F | Resp 18 | Wt 170.0 lb

## 2018-05-17 DIAGNOSIS — Z8542 Personal history of malignant neoplasm of other parts of uterus: Secondary | ICD-10-CM | POA: Insufficient documentation

## 2018-05-17 DIAGNOSIS — Z90722 Acquired absence of ovaries, bilateral: Secondary | ICD-10-CM | POA: Diagnosis not present

## 2018-05-17 DIAGNOSIS — Z79811 Long term (current) use of aromatase inhibitors: Secondary | ICD-10-CM | POA: Insufficient documentation

## 2018-05-17 DIAGNOSIS — Z90710 Acquired absence of both cervix and uterus: Secondary | ICD-10-CM | POA: Diagnosis not present

## 2018-05-17 DIAGNOSIS — C55 Malignant neoplasm of uterus, part unspecified: Secondary | ICD-10-CM

## 2018-05-17 NOTE — Progress Notes (Signed)
Radiation Oncology Follow up Note  Name: Sandra Hendrix   Date:   05/17/2018 MRN:  [id number] DOB: 09-25-1946    This 72 y.o. female presents to the clinic today for 1 year follow-up status post radiation therapy for stage I well-differentiated endometrioid adenocarcinoma status post TAH/BSO with positive cytology.Marland Kitchen  REFERRING PROVIDER: Crecencio Mc, MD  HPI: patient is a 72 year old female now seen out over a year having completed external beam radiation therapy for stage IB well-differentiated endometrioid adenocarcinoma status post TAH/BSO with positive cytology..she received whole pelvic radiation therapy. She is currently on letrozole hormonal therapy and is doing well. She specifically denies any increasing lower urinary tract symptoms diarrhea or vaginal discharge.she did have a vaginal ultrasound back in February 2019 as well asmammography back in June 2019 BI-RADS 1 benign.  COMPLICATIONS OF TREATMENT: none  FOLLOW UP COMPLIANCE: keeps appointments   PHYSICAL EXAM:  BP 125/78 (BP Location: Left Arm, Patient Position: Sitting)   Pulse 65   Temp (!) 97.5 F (36.4 C) (Tympanic)   Resp 18   Wt 169 lb 15.6 oz (77.1 kg)   BMI 33.20 kg/m  On speculum examination vaginal vault is clear vaginal apex is unremarkable. Bimanual examination shows no evidence of parametrial mass.Well-developed well-nourished patient in NAD. HEENT reveals PERLA, EOMI, discs not visualized.  Oral cavity is clear. No oral mucosal lesions are identified. Neck is clear without evidence of cervical or supraclavicular adenopathy. Lungs are clear to A&P. Cardiac examination is essentially unremarkable with regular rate and rhythm without murmur rub or thrill. Abdomen is benign with no organomegaly or masses noted. Motor sensory and DTR levels are equal and symmetric in the upper and lower extremities. Cranial nerves II through XII are grossly intact. Proprioception is intact. No peripheral adenopathy or edema is  identified. No motor or sensory levels are noted. Crude visual fields are within normal range.  RADIOLOGY RESULTS: mammogram as well as ultrasound report reviewed  PLAN: the present time patient is doing well 1 year out with no evidence of disease. She is a very low sodium side effect profile. I'm please were overall progress. I've asked to see her back in 1 year for follow-up. She continues close follow-up care with [facility]. Patient is to call with any concerns.  I would like to take this opportunity to thank you for allowing me to participate in the care of your patient.Noreene Filbert, MD

## 2018-05-30 ENCOUNTER — Ambulatory Visit (INDEPENDENT_AMBULATORY_CARE_PROVIDER_SITE_OTHER): Payer: Medicare Other | Admitting: Orthotics

## 2018-05-30 DIAGNOSIS — M722 Plantar fascial fibromatosis: Secondary | ICD-10-CM

## 2018-05-30 NOTE — Progress Notes (Signed)
Patient came in today to p/up functional foot orthotics.   The orthotics were assessed to both fit and function.  The F/O addressed the biomechanical issues/pathologies as intended, offering good longitudinal arch support, proper offloading, and foot support. There weren't any signs of discomfort or irritation.  The F/O fit properly in footwear with minimal trimming/adjustments. 

## 2018-06-05 ENCOUNTER — Other Ambulatory Visit: Payer: Self-pay | Admitting: Internal Medicine

## 2018-06-11 ENCOUNTER — Other Ambulatory Visit: Payer: Self-pay | Admitting: Internal Medicine

## 2018-06-11 NOTE — Telephone Encounter (Signed)
**  Patient states that she takes 2 of these daily. Ran out of medication because the last prescription was written as 1x a day. Would like prescription updated with [facility] as well. Please advise**  Pantoprazole refill Last Refill:06/05/18 # 90  0 refills Last OV: 09/27/17 PCP: Derrel Nip Pharmacy:[facility]

## 2018-06-11 NOTE — Telephone Encounter (Signed)
Copied from [place] [id number]. Topic: Quick Communication - Rx Refill/Question >> Jun 11, 2018  4:42 PM Sandra Hendrix, NT wrote: **Patient states that she takes 2 of these daily. Ran out of medication because the last prescription was written as 1x a day. Would like prescription updated with [facility] as well. Please advise**  Medication: pantoprazole (PROTONIX) 40 MG tablet   Has the patient contacted their pharmacy? Yes.   (Agent: If no, request that the patient contact the pharmacy for the refill.) (Agent: If yes, when and what did the pharmacy advise?)  Preferred Pharmacy (with phone number or street name): [facility] [place], [place]: Please be advised that RX refills may take up to 3 business days. We ask that you follow-up with your pharmacy.

## 2018-06-14 ENCOUNTER — Telehealth: Payer: Self-pay | Admitting: Internal Medicine

## 2018-06-14 MED ORDER — PANTOPRAZOLE SODIUM 40 MG PO TBEC: 40.0000 mg | DELAYED_RELEASE_TABLET | Freq: Two times a day (BID) | ORAL | 1 refills | Status: DC

## 2018-06-14 NOTE — Telephone Encounter (Signed)
rx request for increase in dosage.

## 2018-06-14 NOTE — Addendum Note (Signed)
Addended by: Crecencio Mc on: 06/14/2018 08:43 PM   Modules accepted: Orders

## 2018-06-14 NOTE — Telephone Encounter (Unsigned)
Copied from [place] [id number]. Topic: Quick Communication - Rx Refill/Question >> Jun 14, 2018  3:34 PM Judyann Munson wrote: Medication: pantoprazole (PROTONIX) 40 MG tablet   Has the patient contacted their pharmacy?no   Preferred Pharmacy (with phone number or street name): [place], [place] - [address] [phone] (Phone) [phone] (Fax)    Patient is calling to state she has been waiting for this medication  since 06-05-18, she is needing 30 day RX sent to the pharmacy for now

## 2018-06-14 NOTE — Telephone Encounter (Signed)
Pt states that she is having to take two tablets daily. Is it okay to send in a new rx with new directions and quantity?

## 2018-06-14 NOTE — Telephone Encounter (Signed)
Correct rx sent to local pharmacy

## 2018-06-19 ENCOUNTER — Other Ambulatory Visit: Payer: Self-pay

## 2018-06-19 MED ORDER — PANTOPRAZOLE SODIUM 40 MG PO TBEC: 40.0000 mg | DELAYED_RELEASE_TABLET | Freq: Two times a day (BID) | ORAL | 1 refills | Status: DC

## 2018-09-05 ENCOUNTER — Inpatient Hospital Stay: Payer: Medicare Other | Attending: Obstetrics and Gynecology | Admitting: Obstetrics and Gynecology

## 2018-09-05 ENCOUNTER — Encounter: Payer: Self-pay | Admitting: Obstetrics and Gynecology

## 2018-09-05 VITALS — BP 108/69 | HR 78 | Temp 99.0°F | Resp 18 | Ht 60.0 in | Wt 172.6 lb

## 2018-09-05 DIAGNOSIS — Z90710 Acquired absence of both cervix and uterus: Secondary | ICD-10-CM | POA: Diagnosis not present

## 2018-09-05 DIAGNOSIS — C55 Malignant neoplasm of uterus, part unspecified: Secondary | ICD-10-CM

## 2018-09-05 DIAGNOSIS — Z79899 Other long term (current) drug therapy: Secondary | ICD-10-CM

## 2018-09-05 DIAGNOSIS — Z79811 Long term (current) use of aromatase inhibitors: Secondary | ICD-10-CM | POA: Insufficient documentation

## 2018-09-05 DIAGNOSIS — C541 Malignant neoplasm of endometrium: Secondary | ICD-10-CM | POA: Diagnosis not present

## 2018-09-05 DIAGNOSIS — Z90722 Acquired absence of ovaries, bilateral: Secondary | ICD-10-CM | POA: Insufficient documentation

## 2018-09-05 MED ORDER — LETROZOLE 2.5 MG PO TABS: 2.5000 mg | ORAL_TABLET | Freq: Every day | ORAL | 3 refills | Status: DC

## 2018-09-05 NOTE — Patient Instructions (Signed)
Follow up appointment with Dr. Thamas Jaegers office: contact his office to schedule appointment with one of his partners in March/April 2020.    Kegel Exercises Kegel exercises help strengthen the muscles that support the rectum, vagina, small intestine, bladder, and uterus. Doing Kegel exercises can help:  Improve bladder and bowel control.  Improve sexual response.  Reduce problems and discomfort during pregnancy.  Kegel exercises involve squeezing your pelvic floor muscles, which are the same muscles you squeeze when you try to stop the flow of urine. The exercises can be done while sitting, standing, or lying down, but it is best to vary your position. Phase 1 exercises 1. Squeeze your pelvic floor muscles tight. You should feel a tight lift in your rectal area. If you are a female, you should also feel a tightness in your vaginal area. Keep your stomach, buttocks, and legs relaxed. 2. Hold the muscles tight for up to 10 seconds. 3. Relax your muscles. Repeat this exercise 50 times a day or as many times as told by your health care provider. Continue to do this exercise for at least 4-6 weeks or for as long as told by your health care provider. This information is not intended to replace advice given to you by your health care provider. Make sure you discuss any questions you have with your health care provider. Document Released: 08/29/2012 Document Revised: 05/07/2016 Document Reviewed: 08/02/2015 Elsevier Interactive Patient Education  [employer].

## 2018-09-05 NOTE — Progress Notes (Signed)
Gynecologic Oncology Interval Visit   Referring Provider: Brayton Mars, MD  Chief Concern: Stage IB WELL-DIFFERENTIATED ENDOMETRIOID ADENOCARCINOMA of the endometrium with positive cytology  Subjective:  Sandra Hendrix is a 72 y.o. female who is seen in consultation from Dr. Enzo Bi for stage IB grade 1 endometrial cancer with positive cytology , s/p TLH, BSO, SLN mapping and biopsies on 03/01/17, XRT, returns today for routine follow-up and pelvic exam.   The patient was last seen 12/20/2017 in [place] clinic and had a negative exam. She was started on adjuvant letrozole therapy for positive cytology.   DEXA scan 02/09/2015 normal bone density.  In the interim, she has seen Dr. Baruch Gouty on 05/17/2018 for follow-up and had a negative exam.   Today, she reports overall feeling well. She continues letrozole. She has a h/o urinary incontinence with sneezing or coughing which is chronic and unchanged. We has been offered evaluation with Dr. Sharlett Iles, urogyn, at [facility], but she has declined.  She has complaints chronic peripheral neuropathy due to diabetes and leg swelling. She complains of left hip pain that she attributes to arthritis.    Gynecologic Oncology History She presented with postmenopausal bleeding. Her evaluation included Pap on 10/17/2016 with endometrial cells present otherwise NILM; and on 11/16/2016 endometrial biopsy that revealed atypical glandular epithelium.   Her evaluation also included pelvic US 10/17/2016 that showed uterine mass.  She underwent hysteroscopy/D&C on 01/02/2017 that revealed grade 1 endometrioid endometrial cancer.  Definitive cancer surgery was scheduled 5/9, but was delayed a month due to death of her bother on 02-10-2023.     Underwent TLH, BSO, SLN mapping and biopsieson 03/01/17  DIAGNOSIS:  A. SENTINEL LYMPH NODE 1, LEFT EXTERNAL ILIAC; EXCISION:  - NO TUMOR SEEN IN ONE LYMPH NODE (0/1).   B. SENTINEL LYMPH NODE 2, LEFT EXTERNAL ILIAC; EXCISION:   - NO TUMOR SEEN IN ONE LYMPH NODE (0/1).   C. SENTINEL LYMPH NODE 3, LEFT EXTERNAL ILIAC; EXCISION:  - NO TUMOR SEEN IN ONE LYMPH NODE (0/1).   D. SENTINEL LYMPH NODE, RIGHT EXTERNAL ILIAC; EXCISION:  - NO TUMOR SEEN IN ONE LYMPH NODE (0/1).   E. UTERUS WITH CERVIX, BILATERAL FALLOPIAN TUBES AND OVARIES;  LAPAROSCOPIC TOTAL HYSTERECTOMY AND BILATERAL SALPINGO-OOPHORECTOMY:  - ENDOMETRIOID CARCINOMA WITH SQUAMOUS DIFFERENTIATION, FIGO GRADE I.  - INVASION EXTENDS INTO THE OUTER HALF OF THE MYOMETRIUM.  - AGE-RELATED CHANGE, BILATERAL OVARIES.  - PARATUBAL CYSTS, BILATERAL FALLOPIAN TUBES.  - SEE SUMMARY BELOW.   Surgical Pathology Cancer Case Summary  ENDOMETRIUM:  Procedure: Laparoscopic total hysterectomy, bilateral  salpingo-oophorectomy and sentinel lymph node excision  Histologic Type: Endometrioid carcinoma with squamous differentiation  Histologic Grade: FIGO grade 1  Myometrial Invasion: Present; Depth of invasion: 26.75 mm; Myometrial  thickness (millimeters): 27 mm; Percentage of myometrial invasion: 99%  Uterine Serosa Involvement: Not identified  Cervical Stromal Involvement: Not identified  Other Tissue/Organ Involvement: Not identified  Peritoneal/ Ascitic Fluid: Positive for malignancy  Margins: Not applicable  Lymphovascular Invasion: Not identified  Regional Lymph Nodes:  # of pelvicnodes with macrometastasis: 0  # of pelvic nodes with micrometastasis: 0  Total # of pelvic nodes examined: 4  # of pelvic sentinel nodes examined: 4  Laterality of pelvic nodes examined: left and right  Pathologic Stage Classification (pTNM, AJCC 8th Edition): pT1b pN0 (sn)  / FIGO IB   She received adjuvant whole pelvic radiation w/ Dr. Baruch Gouty over 5 weeks completing 05/23/17. She did have a post-radiation UTI which was treated and  resolved with antibiotics.   She initiated on Letrozole on 05/15/17 for positive washings.    11/16/17 Transvaginal u/s with Dr. Enzo Bi on  was negative for cysts or masses.    Problem List: Patient Active Problem List   Diagnosis Date Noted  . Hyperlipidemia associated with type 2 diabetes mellitus ([place]) 03/29/2018  . Bilateral pes planus 03/29/2018  . SUI (stress urinary incontinence, female) 11/23/2017  . Vaginal atrophy 11/23/2017  . Obesity (BMI 30.0-34.9) 06/20/2017  . Carpal tunnel syndrome on right 06/20/2017  . Endometrioid adenocarcinoma of uterus ([place]) 01/19/2017  . OSA on CPAP 11/24/2016  . Cyst of ovary 11/16/2016  . Cervical stenosis (uterine cervix) 11/16/2016  . Cyst, vulva 10/11/2015  . CKD (chronic kidney disease) stage 3, GFR 30-59 ml/min (HCC) 11/07/2014  . GERD (gastroesophageal reflux disease) 12/26/2013  . Irritable bowel syndrome with constipation 12/26/2013  . Hyperlipidemia LDL goal <100 07/15/2013  . Chronic venous insufficiency 03/21/2013  . DM (diabetes mellitus), type 2 with renal complications ([place]) 53/29/9242  . Lack of libido 11/05/2012  . Barrett's esophagus 11/05/2012  . Hiatal hernia 11/05/2012  . Essential hypertension 11/05/2012  . Urinary incontinence    Past Medical History: Past Medical History:  Diagnosis Date  . Allergy   . Anxiety   . Arthritis   . Barrett's esophagus determined by endoscopy   . Cancer [facility])    Uterine Cancer  . Chronic kidney disease   . Depression   . Diabetes mellitus without complication ([place])   . Difficult intubation    "not sure what happened,asleep was told had problem with intubation"  . Diverticulosis   . GERD (gastroesophageal reflux disease)   . History of bronchitis   . History of hiatal hernia   . Hyperlipidemia   . Hypertension   . Sleep apnea    Use C- PAP  . Urinary incontinence   . Varicose veins    Past Surgical History: Past Surgical History:  Procedure Laterality Date  . ABDOMINAL HYSTERECTOMY  03/01/2017  . CHOLECYSTECTOMY    . COLONOSCOPY WITH PROPOFOL N/A 06/14/2016   Procedure: COLONOSCOPY WITH PROPOFOL;   Surgeon: Lollie Sails, MD;  Location[facility] ENDOSCOPY;  Service: Endoscopy;  Laterality: N/A;  . ESOPHAGOGASTRODUODENOSCOPY (EGD) WITH PROPOFOL N/A 06/14/2016   Procedure: ESOPHAGOGASTRODUODENOSCOPY (EGD) WITH PROPOFOL;  Surgeon: Lollie Sails, MD;  Location[facility] ENDOSCOPY;  Service: Endoscopy;  Laterality: N/A;  . ESOPHAGOGASTRODUODENOSCOPY (EGD) WITH PROPOFOL N/A 06/12/2017   Procedure: ESOPHAGOGASTRODUODENOSCOPY (EGD) WITH PROPOFOL;  Surgeon: Lollie Sails, MD;  Location: [facility] ENDOSCOPY;  Service: Endoscopy;  Laterality: N/A;  . ESOPHAGOGASTRODUODENOSCOPY (EGD) WITH PROPOFOL N/A 08/25/2017   Procedure: ESOPHAGOGASTRODUODENOSCOPY (EGD) WITH PROPOFOL;  Surgeon: Lollie Sails, MD;  Location: [facility] ENDOSCOPY;  Service: Endoscopy;  Laterality: N/A;  . HYSTEROSCOPY W/D&C N/A 01/02/2017   Procedure: DILATATION AND CURETTAGE /HYSTEROSCOPY;  Surgeon: Brayton Mars, MD;  Location: [facility] ORS;  Service: Gynecology;  Laterality: N/A;  . JOINT REPLACEMENT Right 2007   Total Knee Replacement  . JOINT REPLACEMENT Left 2004   Total Knee Replacement  . LAPAROSCOPIC HYSTERECTOMY Bilateral 03/01/2017   Procedure: HYSTERECTOMY TOTAL LAPAROSCOPIC BSO;  Surgeon: Mellody Drown, MD;  Location: [facility] ORS;  Service: Gynecology;  Laterality: Bilateral;  . ROTATOR CUFF REPAIR Right   . SENTINEL NODE BIOPSY N/A 03/01/2017   Procedure: SENTINEL NODE INJECTION AND BIOPSY;  Surgeon: Mellody Drown, MD;  Location: [facility] ORS;  Service: Gynecology;  Laterality: N/A;  . TONSILLECTOMY     as a child  .  TUBAL LIGATION    . UPPER GI ENDOSCOPY     x2   Past Gynecologic History:  Patient is postmenopausal. Menarche: 8 History of Abnormal pap: yes, endometrial cells w/o NILM Last pap: 2018 as per interval history  Last Pap:10/17/2016 negative for intraepithelial lesions or malignancy. Benign reparative/reactive changes. ENDOMETRIAL CELLS PRESENT Sexually active: no;  She did not have mammogram in 2018.  2019 Mammogram has been ordered.   OB History:  OB History  Gravida Para Term Preterm AB Living  2 2 2     2   SAB TAB Ectopic Multiple Live Births          2    # Outcome Date GA Lbr Len/2nd Weight Sex Delivery Anes PTL Lv  2 Term 1972   9 lb (4.082 kg) F Vag-Spont   LIV  1 Term 1970   9 lb (4.082 kg) F Vag-Spont   LIV    Family History: Family History  Problem Relation Age of Onset  . Stroke Mother   . Hypertension Mother   . Lupus Mother   . Alcohol abuse Father   . Diabetes Father   . Cancer Father        gallbladder ca into liver   . Arthritis Sister   . Diabetes Sister   . Lumbar disc disease Sister   . Hyperlipidemia Daughter   . Hypertension Daughter   . Cancer Maternal Aunt        ovarian ca  . Diabetes Paternal Aunt   . Drug abuse Paternal Aunt   . Cancer Maternal Grandfather        colon ca  . Hypertension Brother     Social History: Social History   Socioeconomic History  . Marital status: Married    Spouse name: Not on file  . Number of children: 2  . Years of education: 4  . Highest education level: Some college, no degree  Occupational History  . Not on file  Social Needs  . Financial resource strain: Not on file  . Food insecurity:    Worry: Never true    Inability: Never true  . Transportation needs:    Medical: No    Non-medical: No  Tobacco Use  . Smoking status: Never Smoker  . Smokeless tobacco: Never Used  Substance and Sexual Activity  . Alcohol use: Yes    Comment: rare  . Drug use: No  . Sexual activity: Yes    Comment: not often  Lifestyle  . Physical activity:    Days per week: 3 days    Minutes per session: 60 min  . Stress: Not at all  Relationships  . Social connections:    Talks on phone: More than three times a week    Gets together: More than three times a week    Attends religious service: More than 4 times per year    Active member of club or organization: Yes    Attends meetings of clubs or organizations:  More than 4 times per year    Relationship status: Married  . Intimate partner violence:    Fear of current or ex partner: No    Emotionally abused: No    Physically abused: No    Forced sexual activity: No  Other Topics Concern  . Not on file  Social History Narrative  . Not on file    Allergies: Allergies  Allergen Reactions  . Clindamycin/Lincomycin Rash  . Codeine Rash  . Lincomycin Rash  .  Morphine And Related Nausea Only  . Talwin [Pentazocine] Nausea And Vomiting  . Lisinopril Cough    cough  . Naloxone Other (See Comments)    Had issues with sedation and vomiting with EGD  . Oxycodone-Acetaminophen Other (See Comments)    Pt states this medication makes her bowels stop moving    Current Medications: Current Outpatient Medications  Medication Sig Dispense Refill  . ACCU-CHEK AVIVA PLUS test strip TEST TWO TIMES DAILY 200 each 1  . acetaminophen (TYLENOL) 500 MG tablet Take 2 tablets (1,000 mg total) by mouth every 6 (six) hours. (Patient taking differently: Take 1,000 mg every 6 (six) hours as needed by mouth. ) 65 tablet 1  . albuterol (PROVENTIL HFA;VENTOLIN HFA) 108 (90 BASE) MCG/ACT inhaler Inhale 2 puffs into the lungs every 6 (six) hours as needed for wheezing or shortness of breath.     Marland Kitchen aspirin EC 81 MG tablet Take 81 mg by mouth every evening.     Marland Kitchen atorvastatin (LIPITOR) 40 MG tablet Take 20 mg by mouth daily.    Marland Kitchen azelastine (ASTELIN) 0.1 % nasal spray Place 1 spray into both nostrils daily.    . Biotin 1000 MCG tablet Take 1,000 mcg by mouth 2 (two) times daily.    . Cholecalciferol (D3-1000 PO) Take 1 tablet by mouth daily.     Verneita Griffes Bark POWD Take by mouth.    . docusate sodium (COLACE) 100 MG capsule Take 1 capsule (100 mg total) by mouth 2 (two) times daily. 20 capsule 0  . fish oil-omega-3 fatty acids 1000 MG capsule Take 1 g by mouth every evening.     . fluticasone (FLONASE) 50 MCG/ACT nasal spray Place 1 spray into both nostrils daily. 48 g  1  . furosemide (LASIX) 20 MG tablet TAKE 1 TABLET BY MOUTH  EVERY OTHER DAY 45 tablet 1  . glipiZIDE (GLUCOTROL XL) 5 MG 24 hr tablet TAKE 1 TABLET BY MOUTH  DAILY 90 tablet 1  . JANUVIA 100 MG tablet TAKE 1 TABLET BY MOUTH  DAILY 90 tablet 3  . Lancet Devices (ACCU-CHEK SOFTCLIX) lancets Use to test blood sugars 1 -2 times daily 1 each 3  . letrozole (FEMARA) 2.5 MG tablet Take 1 tablet (2.5 mg total) by mouth daily. 90 tablet 3  . LEVEMIR 100 UNIT/ML injection INJECT 20 UNITS SUBCUTANEOUSLY AT BEDTIME *DISCARD AFTER 42 DAYS* 10 mL 2  . loratadine (CLARITIN) 10 MG tablet Take 10 mg by mouth daily.    Marland Kitchen losartan-hydrochlorothiazide (HYZAAR) 50-12.5 MG tablet TAKE 1 TABLET BY MOUTH  DAILY 90 tablet 1  . Magnesium 250 MG TABS Take 1 tablet by mouth daily.    . meclizine (ANTIVERT) 25 MG tablet Take 25 mg by mouth 2 (two) times daily as needed for dizziness.    . metFORMIN (GLUCOPHAGE-XR) 750 MG 24 hr tablet TAKE 2 TABLETS BY MOUTH  DAILY WITH BREAKFAST 180 tablet 1  . Multiple Vitamin (MULTIVITAMIN) tablet Take 1 tablet by mouth daily.    . pantoprazole (PROTONIX) 40 MG tablet Take 1 tablet (40 mg total) by mouth 2 (two) times daily. 180 tablet 1  . Potassium 99 MG TABS Take 1 tablet by mouth daily.    . Probiotic Product (PROBIOTIC-10) CAPS Take 1 capsule by mouth daily.     . sertraline (ZOLOFT) 50 MG tablet TAKE 1 TABLET BY MOUTH  DAILY 90 tablet 1  . verapamil (VERELAN PM) 180 MG 24 hr capsule TAKE 1 CAPSULE BY MOUTH  DAILY 90 capsule 1  . vitamin B-12 (CYANOCOBALAMIN) 1000 MCG tablet Take 1,000 mcg by mouth daily.    . vitamin C (ASCORBIC ACID) 500 MG tablet Take 500 mg by mouth daily.     No current facility-administered medications for this visit.     Review of Systems General: no complaints  HEENT: no complaints  Lungs: no complaints  Cardiac: no complaints  GI: no complaints  GU: see HPI  Musculoskeletal: leg swelling/left hip pain  Extremities: no complaints  Skin: no  complaints  Neuro: h/o peripheral neuropathy  Endocrine: no complaints  Psych: no complaints       Objective:  Physical Examination:  BP 108/69   Pulse 78   Temp 99 F (37.2 C) (Tympanic)   Resp 18   Ht 5' (1.524 m)   Wt 172 lb 9.6 oz (78.3 kg)   BMI 33.71 kg/m   ECOG Performance Status: 0 - Asymptomatic  General appearance: alert, cooperative and appears stated age HEENT:PERRLA, extra ocular movement intact and sclera clear, anicteric Lymph node survey: non-palpable, axillary, inguinal, supraclavicular Cardiovascular: regular rate and rhythm, Respiratory: normal air entry, lungs clear to auscultation Abdomen: soft, non-tender, without masses or hepatosplenomegaly, no ascites, no hernias and well healed incisions Back: inspection of back is normal Extremities: extremities normal, atraumatic, no cyanosis or edema Skin exam - normal coloration and turgor, no rashes, no suspicious skin lesions noted. Neurological exam reveals alert, oriented, normal speech, no focal findings or movement disorder noted.  Pelvic: exam chaperoned by RN;  Vulva: normal appearing vulva w/o masses, tenderness or lesions. Vagina: normal vagina with well healed cuff. Bimanual: no evidences of masses. RV: not indicated   Assessment:  Sandra Hendrix is a 72 y.o. female w/ hx of stage IB, grade 1 endometrioid cancer (positive cytology) w/ hx of of disease invading full thickness of myometrium and positive pelvic peritoneal cytology, bilaterial sentinel nodes, cervix, and advexa were negative. She is s/p whole pelvic radiation and now on Letrozole for positive cytology.  She feels well, tolerating letrozole w/o significant side effects other than hot flashes and no evidence of disease on exam.   Stress urinary incontinence is stable and unchanged.   Left hip pain, possible secondary to arthritis.  Medical co-morbidities complicating care include: OSA on CPAP, HTN DM, CKD, and obesity with BMI 33.4.  She has started weight watchers and is actively trying to lose weight.   Plan:   Problem List Items Addressed This Visit      Genitourinary   Endometrioid adenocarcinoma of uterus ([place]) - Primary   Relevant Orders   DG Bone Density    Other Visit Diagnoses    High risk medication use       Relevant Orders   DG Bone Density     Will plan for patient to continue anti-estrogen therapy (letrozole) and return to clinic in 4 months for pelvic & follow-up alternating visits with Dr. Baruch Gouty 05/23/2019. We will need to determine if she will follow up with one of Dr. Thamas Jaegers partners which she can do so in March or April 2020. If so we will follow up in 1 year.   Letrozole reordered. Obtain DEXA scan now.   Urinary incontinence- symptoms are stable. If patient wishes, she can set up appt with Dr. Sharlett Iles at [facility] for further evaluation. We provided information about Kegel exercises today.  Follow up with her PCP regarding hip pain.    A total of 25 minutes were spent with the  patient/family today; >50% was spent in education, counseling and coordination of care for h/o endometrial cancer. Gillis Ends, MD    CC: Brayton Mars, MD  PCP: Crecencio Mc, MD [address], [place] [address] [phone]

## 2018-09-27 ENCOUNTER — Encounter: Payer: Self-pay | Admitting: Internal Medicine

## 2018-09-27 ENCOUNTER — Ambulatory Visit: Payer: Medicare Other | Admitting: Internal Medicine

## 2018-09-27 ENCOUNTER — Ambulatory Visit (INDEPENDENT_AMBULATORY_CARE_PROVIDER_SITE_OTHER): Payer: Medicare Other

## 2018-09-27 VITALS — BP 92/60 | HR 59 | Temp 97.8°F | Ht 60.75 in | Wt 172.4 lb

## 2018-09-27 DIAGNOSIS — E785 Hyperlipidemia, unspecified: Secondary | ICD-10-CM

## 2018-09-27 DIAGNOSIS — M898X6 Other specified disorders of bone, lower leg: Secondary | ICD-10-CM | POA: Diagnosis not present

## 2018-09-27 DIAGNOSIS — N183 Chronic kidney disease, stage 3 unspecified: Secondary | ICD-10-CM

## 2018-09-27 DIAGNOSIS — G8929 Other chronic pain: Secondary | ICD-10-CM | POA: Diagnosis not present

## 2018-09-27 DIAGNOSIS — I10 Essential (primary) hypertension: Secondary | ICD-10-CM

## 2018-09-27 DIAGNOSIS — M25552 Pain in left hip: Secondary | ICD-10-CM

## 2018-09-27 DIAGNOSIS — E1169 Type 2 diabetes mellitus with other specified complication: Secondary | ICD-10-CM

## 2018-09-27 DIAGNOSIS — R635 Abnormal weight gain: Secondary | ICD-10-CM | POA: Diagnosis not present

## 2018-09-27 DIAGNOSIS — E1121 Type 2 diabetes mellitus with diabetic nephropathy: Secondary | ICD-10-CM

## 2018-09-27 DIAGNOSIS — Z794 Long term (current) use of insulin: Secondary | ICD-10-CM

## 2018-09-27 DIAGNOSIS — Z9989 Dependence on other enabling machines and devices: Secondary | ICD-10-CM

## 2018-09-27 DIAGNOSIS — Z0000 Encounter for general adult medical examination without abnormal findings: Secondary | ICD-10-CM

## 2018-09-27 DIAGNOSIS — G4733 Obstructive sleep apnea (adult) (pediatric): Secondary | ICD-10-CM

## 2018-09-27 LAB — LIPID PANEL
Cholesterol: 194 mg/dL (ref 0–200)
HDL: 87.8 mg/dL (ref >39.00)
LDL Cholesterol: 93 mg/dL (ref 0–99)
NonHDL: 106.55
Total CHOL/HDL Ratio: 2
Triglycerides: 67 mg/dL (ref 0.0–149.0)
VLDL: 13.4 mg/dL (ref 0.0–40.0)

## 2018-09-27 LAB — COMPREHENSIVE METABOLIC PANEL
ALT: 15 U/L (ref 0–35)
AST: 19 U/L (ref 0–37)
Albumin: 4.3 g/dL (ref 3.5–5.2)
Alkaline Phosphatase: 110 U/L (ref 39–117)
BUN: 24 mg/dL — ABNORMAL HIGH (ref 6–23)
CO2: 30 mEq/L (ref 19–32)
Calcium: 9.8 mg/dL (ref 8.4–10.5)
Chloride: 102 mEq/L (ref 96–112)
Creatinine, Ser: 1.01 mg/dL (ref 0.40–1.20)
GFR: 56.87 mL/min — ABNORMAL LOW (ref >60.00)
Glucose, Bld: 65 mg/dL — ABNORMAL LOW (ref 70–99)
Potassium: 3.6 mEq/L (ref 3.5–5.1)
Sodium: 141 mEq/L (ref 135–145)
Total Bilirubin: 0.5 mg/dL (ref 0.2–1.2)
Total Protein: 7.2 g/dL (ref 6.0–8.3)

## 2018-09-27 LAB — MICROALBUMIN / CREATININE URINE RATIO
Creatinine,U: 97.6 mg/dL
Microalb Creat Ratio: 0.7 mg/g (ref 0.0–30.0)
Microalb, Ur: <0.7 mg/dL (ref 0.0–1.9)

## 2018-09-27 LAB — HEMOGLOBIN A1C: Hgb A1c MFr Bld: 6.3 % (ref 4.6–6.5)

## 2018-09-27 LAB — TSH: TSH: 2.78 u[IU]/mL (ref 0.35–4.50)

## 2018-09-27 NOTE — Progress Notes (Signed)
Patient ID: Sandra Hendrix, female    DOB: 1946-01-31  Age: 73 y.o. MRN: [id number]  The patient is here for annual preventive  examination and management of other chronic and acute problems.  Mammogram June 2019 [facility] Colon due 2022     The risk factors are reflected in the social history.  The roster of all physicians providing medical care to patient - is listed in the Snapshot section of the chart.  Activities of daily living:  The patient is 100% independent in all ADLs: dressing, toileting, feeding as well as independent mobility  Home safety : The patient has smoke detectors in the home. They wear seatbelts.  There are no firearms at home. There is no violence in the home.   There is no risks for hepatitis, STDs or HIV. There is no   history of blood transfusion. They have no travel history to infectious disease endemic areas of the world.  The patient has seen their dentist in the last six month. They have seen their eye doctor in the last year. They admit to slight hearing difficulty with regard to whispered voices and some television programs.  They have deferred audiologic testing in the last year.  They do not  have excessive sun exposure. Discussed the need for sun protection: hats, long sleeves and use of sunscreen if there is significant sun exposure.   Diet: the importance of a healthy diet is discussed. They do have a healthy diet.  The benefits of regular aerobic exercise were discussed. She walks 4 times per week ,  20 minutes.   Depression screen: there are no signs or vegative symptoms of depression- irritability, change in appetite, anhedonia, sadness/tearfullness.  Cognitive assessment: the patient manages all their financial and personal affairs and is actively engaged. They could relate day,date,year and events; recalled 2/3 objects at 3 minutes; performed clock-face test normally.  The following portions of the patient's history were reviewed and updated as  appropriate: allergies, current medications, past family history, past medical history,  past surgical history, past social history  and problem list.  Visual acuity was not assessed per patient preference since she has regular follow up with her ophthalmologist. Hearing and body mass index were assessed and reviewed.   During the course of the visit the patient was educated and counseled about appropriate screening and preventive services including : fall prevention , diabetes screening, nutrition counseling, colorectal cancer screening, and recommended immunizations.    CC: The primary encounter diagnosis was Hip pain, chronic, left. Diagnoses of Bone pain of lower leg, Type 2 diabetes mellitus with diabetic nephropathy, with long-term current use of insulin ([place]), Weight gain, CKD (chronic kidney disease) stage 3, GFR 30-59 ml/min (HCC), Essential hypertension, Hyperlipidemia associated with type 2 diabetes mellitus ([place]), OSA on CPAP, Encounter for preventive health examination, and Pain of right tibia were also pertinent to this visit.  1) rash on upper chest wall  found yesterday.slightly itchy. Not spreading.    2) left hip pain and right leg pain.  Both are mild to moderae aggravated by activity and weight bearing Using flexeril prescribed for prior neck strain.  History Tawyna has a past medical history of Allergy, Anxiety, Arthritis, Barrett's esophagus determined by endoscopy, Cancer ([place]), Chronic kidney disease, Depression, Diabetes mellitus without complication ([place]), Difficult intubation, Diverticulosis, GERD (gastroesophageal reflux disease), History of bronchitis, History of hiatal hernia, Hyperlipidemia, Hypertension, Sleep apnea, Urinary incontinence, and Varicose veins.   She has a past surgical history that includes Rotator  cuff repair (Right); Cholecystectomy; Upper gi endoscopy; Esophagogastroduodenoscopy (egd) with propofol (N/A, 06/14/2016); Colonoscopy with propofol (N/A,  06/14/2016); Joint replacement (Right, 2007); Joint replacement (Left, 2004); Tonsillectomy; Tubal ligation; Hysteroscopy w/D&C (N/A, 01/02/2017); Laparoscopic hysterectomy (Bilateral, 03/01/2017); Sentinel node biopsy (N/A, 03/01/2017); Esophagogastroduodenoscopy (egd) with propofol (N/A, 06/12/2017); Esophagogastroduodenoscopy (egd) with propofol (N/A, 08/25/2017); and Abdominal hysterectomy (03/01/2017).   Her family history includes Alcohol abuse in her father; Arthritis in her sister; Cancer in her father, maternal aunt, and maternal grandfather; Diabetes in her father, paternal aunt, and sister; Drug abuse in her paternal aunt; Hyperlipidemia in her daughter; Hypertension in her brother, daughter, and mother; Lumbar disc disease in her sister; Lupus in her mother; Stroke in her mother.She reports that she has never smoked. She has never used smokeless tobacco. She reports current alcohol use. She reports that she does not use drugs.  Outpatient Medications Prior to Visit  Medication Sig Dispense Refill  . ACCU-CHEK AVIVA PLUS test strip TEST TWO TIMES DAILY 200 each 1  . acetaminophen (TYLENOL) 500 MG tablet Take 2 tablets (1,000 mg total) by mouth every 6 (six) hours. (Patient taking differently: Take 1,000 mg every 6 (six) hours as needed by mouth. ) 65 tablet 1  . albuterol (PROVENTIL HFA;VENTOLIN HFA) 108 (90 BASE) MCG/ACT inhaler Inhale 2 puffs into the lungs every 6 (six) hours as needed for wheezing or shortness of breath.     Marland Kitchen aspirin EC 81 MG tablet Take 81 mg by mouth every evening.     Marland Kitchen atorvastatin (LIPITOR) 40 MG tablet Take 20 mg by mouth daily.    Marland Kitchen azelastine (ASTELIN) 0.1 % nasal spray Place 1 spray into both nostrils daily.    . Biotin 1000 MCG tablet Take 1,000 mcg by mouth 2 (two) times daily.    . Cholecalciferol (D3-1000 PO) Take 1 tablet by mouth daily.     Verneita Griffes Bark POWD Take by mouth.    . cyclobenzaprine (FLEXERIL) 10 MG tablet Take 10 mg by mouth daily as needed for  muscle spasms.    Marland Kitchen docusate sodium (COLACE) 100 MG capsule Take 1 capsule (100 mg total) by mouth 2 (two) times daily. 20 capsule 0  . fish oil-omega-3 fatty acids 1000 MG capsule Take 1 g by mouth every evening.     . fluticasone (FLONASE) 50 MCG/ACT nasal spray Place 1 spray into both nostrils daily. 48 g 1  . furosemide (LASIX) 20 MG tablet TAKE 1 TABLET BY MOUTH  EVERY OTHER DAY 45 tablet 1  . glipiZIDE (GLUCOTROL XL) 5 MG 24 hr tablet TAKE 1 TABLET BY MOUTH  DAILY 90 tablet 1  . JANUVIA 100 MG tablet TAKE 1 TABLET BY MOUTH  DAILY 90 tablet 3  . Lancet Devices (ACCU-CHEK SOFTCLIX) lancets Use to test blood sugars 1 -2 times daily 1 each 3  . letrozole (FEMARA) 2.5 MG tablet Take 1 tablet (2.5 mg total) by mouth daily. 90 tablet 3  . LEVEMIR 100 UNIT/ML injection INJECT 20 UNITS SUBCUTANEOUSLY AT BEDTIME *DISCARD AFTER 42 DAYS* 10 mL 2  . loratadine (CLARITIN) 10 MG tablet Take 10 mg by mouth daily.    Marland Kitchen losartan-hydrochlorothiazide (HYZAAR) 50-12.5 MG tablet TAKE 1 TABLET BY MOUTH  DAILY 90 tablet 1  . Magnesium 250 MG TABS Take 1 tablet by mouth daily.    . meclizine (ANTIVERT) 25 MG tablet Take 25 mg by mouth 2 (two) times daily as needed for dizziness.    . metFORMIN (GLUCOPHAGE-XR) 750 MG 24 hr  tablet TAKE 2 TABLETS BY MOUTH  DAILY WITH BREAKFAST 180 tablet 1  . Multiple Vitamin (MULTIVITAMIN) tablet Take 1 tablet by mouth daily.    . NON FORMULARY medroxy progesterone one pill once daily    . pantoprazole (PROTONIX) 40 MG tablet Take 1 tablet (40 mg total) by mouth 2 (two) times daily. 180 tablet 1  . Polyethyl Glycol-Propyl Glycol (SYSTANE) 0.4-0.3 % SOLN Apply to eye.    Marland Kitchen Potassium 99 MG TABS Take 1 tablet by mouth daily.    . Probiotic Product (PROBIOTIC-10) CAPS Take 1 capsule by mouth daily.     . sertraline (ZOLOFT) 50 MG tablet TAKE 1 TABLET BY MOUTH  DAILY 90 tablet 1  . verapamil (VERELAN PM) 180 MG 24 hr capsule TAKE 1 CAPSULE BY MOUTH  DAILY 90 capsule 1  . vitamin B-12  (CYANOCOBALAMIN) 1000 MCG tablet Take 1,000 mcg by mouth daily.    . vitamin C (ASCORBIC ACID) 500 MG tablet Take 500 mg by mouth daily.     No facility-administered medications prior to visit.     Review of Systems  Patient denies headache, fevers, malaise, unintentional weight loss,eye pain, sinus congestion and sinus pain, sore throat, dysphagia,  hemoptysis , cough, dyspnea, wheezing, chest pain, palpitations, orthopnea, edema, abdominal pain, nausea, melena, diarrhea, constipation, flank pain, dysuria, hematuria, urinary  Frequency, nocturia, numbness, tingling, seizures,  Focal weakness, Loss of consciousness,  Tremor, insomnia, depression, anxiety, and suicidal ideation.     Objective:  BP 92/60 (BP Location: Left Arm, Patient Position: Sitting, Cuff Size: Normal)   Pulse (!) 59   Temp 97.8 F (36.6 C) (Oral)   Ht 5' 0.75" (1.543 m)   Wt 172 lb 6.4 oz (78.2 kg)   SpO2 97%   BMI 32.84 kg/m   Physical Exam   General appearance: alert, cooperative and appears stated age Ears: normal TM's and external ear canals both ears Throat: lips, mucosa, and tongue normal; teeth and gums normal Neck: no adenopathy, no carotid bruit, supple, symmetrical, trachea midline and thyroid not enlarged, symmetric, no tenderness/mass/nodules Back: symmetric, no curvature. ROM normal. No CVA tenderness. Lungs: clear to auscultation bilaterally Heart: regular rate and rhythm, S1, S2 normal, no murmur, click, rub or gallop Abdomen: soft, non-tender; bowel sounds normal; no masses,  no organomegaly Pulses: 2+ and symmetric Skin: papular rash on sternal notch. Lymph nodes: Cervical, supraclavicular, and axillary nodes normal.    Assessment & Plan:   Problem List Items Addressed This Visit    Bone pain of lower leg   Relevant Orders   DG Tibia/Fibula Right (Completed)   CKD (chronic kidney disease) stage 3, GFR 30-59 ml/min (HCC)    Stable, Monitored semi annually by [facility].  GFR has been stable at  60 ml/min  Workup was notable only for a congenitally small right kidney   By U/S.  Lab Results  Component Value Date   MICROALBUR <0.7 09/27/2018   Lab Results  Component Value Date   CREATININE 1.01 09/27/2018         DM (diabetes mellitus), type 2 with renal complications (HCC) (Chronic)    Currently well-controlled on current medications (JANUVIA, metformin, Levemir and glipizide).  hemoglobin A1c is at goal of less than 7.0 . Patient is reminded to schedule an annual eye exam and foot exam is normal today. Patient has no microalbuminuria. Patient is tolerating statin therapy for CAD risk reduction and on ACE/ARB for renal protection and hypertension   Lab Results  Component Value  Date   HGBA1C 6.3 09/27/2018   Lab Results  Component Value Date   MICROALBUR <0.7 09/27/2018   Lab Results  Component Value Date   CREATININE 1.01 09/27/2018         Relevant Orders   Hemoglobin A1c (Completed)   Comprehensive metabolic panel (Completed)   Lipid panel (Completed)   Microalbumin / creatinine urine ratio (Completed)   Encounter for preventive health examination    age appropriate education and counseling updated, referrals for preventative services and immunizations addressed, dietary and smoking counseling addressed, most recent labs reviewed.  I have personally reviewed and have noted:  1) the patient's medical and social history 2) The pt's use of alcohol, tobacco, and illicit drugs 3) The patient's current medications and supplements 4) Functional ability including ADL's, fall risk, home safety risk, hearing and visual impairment 5) Diet and physical activities 6) Evidence for depression or mood disorder 7) The patient's height, weight, and BMI have been recorded in the chart  I have made referrals, and provided counseling and education based on review of the above      Essential hypertension    Well controlled on current regimen. Renal function stable, no changes  today.  Lab Results  Component Value Date   CREATININE 1.01 09/27/2018   Lab Results  Component Value Date   NA 141 09/27/2018   K 3.6 09/27/2018   CL 102 09/27/2018   CO2 30 09/27/2018         Hip pain, chronic, left - Primary    She has mild degenerative changes based on a review  Of today's films .  She has been  advised to avoid NSAIDs but to use tylenol and flexeril as needed      Relevant Medications   cyclobenzaprine (FLEXERIL) 10 MG tablet   Other Relevant Orders   DG HIP UNILAT WITH PELVIS 2-3 VIEWS LEFT (Completed)   Hyperlipidemia associated with type 2 diabetes mellitus (HCC)    LDL and triglycerides are at goal on current medications. She has no side effects and liver enzymes are normal. No changes today.  Lab Results  Component Value Date   CHOL 194 09/27/2018   HDL 87.80 09/27/2018   LDLCALC 93 09/27/2018   LDLDIRECT 80.0 02/22/2017   TRIG 67.0 09/27/2018   CHOLHDL 2 09/27/2018   Lab Results  Component Value Date   ALT 15 09/27/2018   AST 19 09/27/2018   ALKPHOS 110 09/27/2018   BILITOT 0.5 09/27/2018          OSA on CPAP    Diagnosed by prior sleep study. She is wearing her CPAP every night a minimum of 6 hours per night and notes improved daytime wakefulness and decreased fatigue       Pain of right tibia    Plain films done to rule  out lytic lesion given history of endometrial CA        Other Visit Diagnoses    Weight gain       Relevant Orders   TSH (Completed)      I am having Davanna P. Spano maintain her Cholecalciferol (D3-1000 PO), multivitamin, fish oil-omega-3 fatty acids, albuterol, aspirin EC, PROBIOTIC-10, loratadine, azelastine, Biotin, acetaminophen, docusate sodium, meclizine, accu-chek softclix, fluticasone, ACCU-CHEK AVIVA PLUS, vitamin B-12, Potassium, JANUVIA, verapamil, sertraline, metFORMIN, atorvastatin, losartan-hydrochlorothiazide, Cinnamon Bark, LEVEMIR, furosemide, glipiZIDE, pantoprazole, Magnesium, vitamin  C, letrozole, cyclobenzaprine, NON FORMULARY, and Polyethyl Glycol-Propyl Glycol.  No orders of the defined types were placed in this encounter.  There are no discontinued medications.  Follow-up: Return in about 6 months (around 03/28/2019) for follow up diabetes.   Crecencio Mc, MD

## 2018-09-27 NOTE — Patient Instructions (Addendum)
Health Maintenance After Age 75 After age 75, you are at a higher risk for certain long-term diseases and infections as well as injuries from falls. Falls are a major cause of broken bones and head injuries in people who are older than age 75. Getting regular preventive care can help to keep you healthy and well. Preventive care includes getting regular testing and making lifestyle changes as recommended by your health care provider. Talk with your health care provider about:  Which screenings and tests you should have. A screening is a test that checks for a disease when you have no symptoms.  A diet and exercise plan that is right for you. What should I know about screenings and tests to prevent falls? Screening and testing are the best ways to find a health problem early. Early diagnosis and treatment give you the best chance of managing medical conditions that are common after age 75. Certain conditions and lifestyle choices may make you more likely to have a fall. Your health care provider may recommend:  Regular vision checks. Poor vision and conditions such as cataracts can make you more likely to have a fall. If you wear glasses, make sure to get your prescription updated if your vision changes.  Medicine review. Work with your health care provider to regularly review all of the medicines you are taking, including over-the-counter medicines. Ask your health care provider about any side effects that may make you more likely to have a fall. Tell your health care provider if any medicines that you take make you feel dizzy or sleepy.  Osteoporosis screening. Osteoporosis is a condition that causes the bones to get weaker. This can make the bones weak and cause them to break more easily.  Blood pressure screening. Blood pressure changes and medicines to control blood pressure can make you feel dizzy.  Strength and balance checks. Your health care provider may recommend certain tests to check your  strength and balance while standing, walking, or changing positions.  Foot health exam. Foot pain and numbness, as well as not wearing proper footwear, can make you more likely to have a fall.  Depression screening. You may be more likely to have a fall if you have a fear of falling, feel emotionally low, or feel unable to do activities that you used to do.  Alcohol use screening. Using too much alcohol can affect your balance and may make you more likely to have a fall. What actions can I take to lower my risk of falls? General instructions  Talk with your health care provider about your risks for falling. Tell your health care provider if: ? You fall. Be sure to tell your health care provider about all falls, even ones that seem minor. ? You feel dizzy, sleepy, or off-balance.  Take over-the-counter and prescription medicines only as told by your health care provider. These include any supplements.  Eat a healthy diet and maintain a healthy weight. A healthy diet includes low-fat dairy products, low-fat (lean) meats, and fiber from whole grains, beans, and lots of fruits and vegetables. Home safety  Remove any tripping hazards, such as rugs, cords, and clutter.  Install safety equipment such as grab bars in bathrooms and safety rails on stairs.  Keep rooms and walkways well-lit. Activity   Follow a regular exercise program to stay fit. This will help you maintain your balance. Ask your health care provider what types of exercise are appropriate for you.  If you need a cane or   walker, use it as recommended by your health care provider.  Wear supportive shoes that have nonskid soles. Lifestyle  Do not drink alcohol if your health care provider tells you not to drink.  If you drink alcohol, limit how much you have: ? 0-1 drink a day for women. ? 0-2 drinks a day for men.  Be aware of how much alcohol is in your drink. In the [place] one drink equals one typical bottle of beer (12  oz), one-half glass of wine (5 oz), or one shot of hard liquor (1 oz).  Do not use any products that contain nicotine or tobacco, such as cigarettes and e-cigarettes. If you need help quitting, ask your health care provider. Summary  Having a healthy lifestyle and getting preventive care can help to protect your health and wellness after age 75.  Screening and testing are the best way to find a health problem early and help you avoid having a fall. Early diagnosis and treatment give you the best chance for managing medical conditions that are more common for people who are older than age 75.  Falls are a major cause of broken bones and head injuries in people who are older than age 75. Take precautions to prevent a fall at home.  Work with your health care provider to learn what changes you can make to improve your health and wellness and to prevent falls. This information is not intended to replace advice given to you by your health care provider. Make sure you discuss any questions you have with your health care provider. Document Released: 07/26/2017 Document Revised: 07/26/2017 Document Reviewed: 07/26/2017 Elsevier Interactive Patient Education  2019 [employer]  

## 2018-09-29 ENCOUNTER — Encounter: Payer: Self-pay | Admitting: Internal Medicine

## 2018-09-29 DIAGNOSIS — M25552 Pain in left hip: Principal | ICD-10-CM

## 2018-09-29 DIAGNOSIS — M898X6 Other specified disorders of bone, lower leg: Secondary | ICD-10-CM | POA: Insufficient documentation

## 2018-09-29 DIAGNOSIS — G8929 Other chronic pain: Secondary | ICD-10-CM | POA: Insufficient documentation

## 2018-09-29 NOTE — Assessment & Plan Note (Signed)
Diagnosed by prior sleep study. She is wearing her CPAP every night a minimum of 6 hours per night and notes improved daytime wakefulness and decreased fatigue

## 2018-09-29 NOTE — Assessment & Plan Note (Signed)

## 2018-09-29 NOTE — Assessment & Plan Note (Signed)
Plain films done to rule  out lytic lesion given history of endometrial CA

## 2018-09-29 NOTE — Assessment & Plan Note (Signed)
Currently well-controlled on current medications (JANUVIA, metformin, Levemir and glipizide).  hemoglobin A1c is at goal of less than 7.0 . Patient is reminded to schedule an annual eye exam and foot exam is normal today. Patient has no microalbuminuria. Patient is tolerating statin therapy for CAD risk reduction and on ACE/ARB for renal protection and hypertension   Lab Results  Component Value Date   HGBA1C 6.3 09/27/2018   Lab Results  Component Value Date   MICROALBUR <0.7 09/27/2018   Lab Results  Component Value Date   CREATININE 1.01 09/27/2018

## 2018-09-29 NOTE — Assessment & Plan Note (Signed)
Stable, Monitored semi annually by [facility].  GFR has been stable at 60 ml/min  Workup was notable only for a congenitally small right kidney   By U/S.  Lab Results  Component Value Date   MICROALBUR <0.7 09/27/2018   Lab Results  Component Value Date   CREATININE 1.01 09/27/2018    

## 2018-09-29 NOTE — Assessment & Plan Note (Signed)
LDL and triglycerides are at goal on current medications. She has no side effects and liver enzymes are normal. No changes today.  Lab Results  Component Value Date   CHOL 194 09/27/2018   HDL 87.80 09/27/2018   LDLCALC 93 09/27/2018   LDLDIRECT 80.0 02/22/2017   TRIG 67.0 09/27/2018   CHOLHDL 2 09/27/2018   Lab Results  Component Value Date   ALT 15 09/27/2018   AST 19 09/27/2018   ALKPHOS 110 09/27/2018   BILITOT 0.5 09/27/2018

## 2018-09-29 NOTE — Assessment & Plan Note (Signed)
Well controlled on current regimen. Renal function stable, no changes today.  Lab Results  Component Value Date   CREATININE 1.01 09/27/2018   Lab Results  Component Value Date   NA 141 09/27/2018   K 3.6 09/27/2018   CL 102 09/27/2018   CO2 30 09/27/2018

## 2018-09-29 NOTE — Assessment & Plan Note (Signed)
She has mild degenerative changes based on a review  Of today's films .  She has been  advised to avoid NSAIDs but to use tylenol and flexeril as needed

## 2018-10-01 ENCOUNTER — Telehealth: Payer: Self-pay

## 2018-10-01 NOTE — Telephone Encounter (Signed)
Pt was in office and given copy of xrays by team lead.

## 2018-10-01 NOTE — Telephone Encounter (Signed)
Patient would like to have a copy of the [id number] xrays.  Patient is waiting up front.

## 2018-10-11 ENCOUNTER — Ambulatory Visit: Payer: Medicare Other | Attending: Orthopedic Surgery | Admitting: Physical Therapy

## 2018-10-11 ENCOUNTER — Other Ambulatory Visit: Payer: Self-pay

## 2018-10-11 ENCOUNTER — Encounter: Payer: Self-pay | Admitting: Physical Therapy

## 2018-10-11 DIAGNOSIS — M545 Low back pain, unspecified: Secondary | ICD-10-CM

## 2018-10-11 DIAGNOSIS — M25552 Pain in left hip: Secondary | ICD-10-CM | POA: Diagnosis present

## 2018-10-11 DIAGNOSIS — M6281 Muscle weakness (generalized): Secondary | ICD-10-CM | POA: Diagnosis present

## 2018-10-11 DIAGNOSIS — G8929 Other chronic pain: Secondary | ICD-10-CM | POA: Diagnosis present

## 2018-10-11 DIAGNOSIS — M25561 Pain in right knee: Secondary | ICD-10-CM | POA: Insufficient documentation

## 2018-10-11 DIAGNOSIS — R262 Difficulty in walking, not elsewhere classified: Secondary | ICD-10-CM | POA: Insufficient documentation

## 2018-10-11 NOTE — Therapy (Signed)
[facility] [address] [place], [address] Phone: [phone]   Fax:  [phone]  Physical Therapy Evaluation  Patient Details  Name: Sandra Hendrix MRN: [id number] Date of Birth: [birth year] Referring Provider (PT): Lajean Manes, [place]   Encounter Date: 10/11/2018  PT End of Session - 10/12/18 1239    Visit Number  1    Number of Visits  12    Date for PT Re-Evaluation  11/23/18    Authorization Type  [employer] reporting period from 10/11/2018    Authorization Time Period  Current cert period: 6/62/9476 - 11/23/2018 (latest PN: IE 10/11/2018)    Authorization - Visit Number  1    Authorization - Number of Visits  10    PT Start Time  5465    PT Stop Time  0354    PT Time Calculation (min)  55 min    Activity Tolerance  Patient tolerated treatment well;No increased pain    Behavior During Therapy  WFL for tasks assessed/performed       Past Medical History:  Diagnosis Date  . Allergy   . Anxiety   . Arthritis   . Barrett's esophagus determined by endoscopy   . Cancer [facility])    Uterine Cancer  . Chronic kidney disease   . Depression   . Diabetes mellitus without complication ([place])   . Difficult intubation    "not sure what happened,asleep was told had problem with intubation"  . Diverticulosis   . GERD (gastroesophageal reflux disease)   . History of bronchitis   . History of hiatal hernia   . Hyperlipidemia   . Hypertension   . Sleep apnea    Use C- PAP  . Urinary incontinence   . Varicose veins     Past Surgical History:  Procedure Laterality Date  . ABDOMINAL HYSTERECTOMY  03/01/2017  . CHOLECYSTECTOMY    . COLONOSCOPY WITH PROPOFOL N/A 06/14/2016   Procedure: COLONOSCOPY WITH PROPOFOL;  Surgeon: Lollie Sails, MD;  Location: [facility] ENDOSCOPY;  Service: Endoscopy;  Laterality: N/A;  . ESOPHAGOGASTRODUODENOSCOPY (EGD) WITH PROPOFOL N/A 06/14/2016   Procedure:  ESOPHAGOGASTRODUODENOSCOPY (EGD) WITH PROPOFOL;  Surgeon: Lollie Sails, MD;  Location: [facility] ENDOSCOPY;  Service: Endoscopy;  Laterality: N/A;  . ESOPHAGOGASTRODUODENOSCOPY (EGD) WITH PROPOFOL N/A 06/12/2017   Procedure: ESOPHAGOGASTRODUODENOSCOPY (EGD) WITH PROPOFOL;  Surgeon: Lollie Sails, MD;  Location: [facility] ENDOSCOPY;  Service: Endoscopy;  Laterality: N/A;  . ESOPHAGOGASTRODUODENOSCOPY (EGD) WITH PROPOFOL N/A 08/25/2017   Procedure: ESOPHAGOGASTRODUODENOSCOPY (EGD) WITH PROPOFOL;  Surgeon: Lollie Sails, MD;  Location: [facility] ENDOSCOPY;  Service: Endoscopy;  Laterality: N/A;  . HYSTEROSCOPY W/D&C N/A 01/02/2017   Procedure: DILATATION AND CURETTAGE /HYSTEROSCOPY;  Surgeon: Brayton Mars, MD;  Location: [facility] ORS;  Service: Gynecology;  Laterality: N/A;  . JOINT REPLACEMENT Right 2007   Total Knee Replacement  . JOINT REPLACEMENT Left 2004   Total Knee Replacement  . LAPAROSCOPIC HYSTERECTOMY Bilateral 03/01/2017   Procedure: HYSTERECTOMY TOTAL LAPAROSCOPIC BSO;  Surgeon: Mellody Drown, MD;  Location: [facility] ORS;  Service: Gynecology;  Laterality: Bilateral;  . ROTATOR CUFF REPAIR Right   . SENTINEL NODE BIOPSY N/A 03/01/2017   Procedure: SENTINEL NODE INJECTION AND BIOPSY;  Surgeon: Mellody Drown, MD;  Location: [facility] ORS;  Service: Gynecology;  Laterality: N/A;  . TONSILLECTOMY     as a child  . TUBAL LIGATION    . UPPER GI ENDOSCOPY     x2  There were no vitals filed for this visit.   Subjective Assessment - 10/11/18 1915    Subjective  Patient states condition started about 2 months ago when she suddenly started having right knee/shin pain when she catches herself on that leg or twists. She thinks she may have hurt it around that time when she feel at the end of the stiars while vaccuming. She did not feel the pain right away. It was sore but she didn't thing she hurt herself at the time. She started going to the gym before Christmas (maybe in August). She started  working out with seniors at [employer]. It does include jumping exercises like jumping jacks (but she feels she doesn't really jump). She has not had to stop but she doesn't push it as much as usual. She first sought medical care for her pain a little over a week ago. She could not step down a step or if she cought herself it felt like her right leg was giving way. She must step on right leg first when she gets out of the tub and this causes anxiety about pain or if her leg will hold her. She was given a prednisone pack which she is still taking it (one a day). It has helped. Prior to that it was getting worse. Walking really fast made her feel like she was going to fall. She has also been having trouble with the left hip. When she exercises or does too much work or picks something up it will start her left hip pain. She has a history of chronic intermittent back and left hip pain.  Back pain: no surgeries in the back. She has never had pain down her legs from her back before.     Pertinent History  Patient is a 73 y.o. female who presents to outpatient physical therapy with a referral for medical diagnosis Iliotibial band syndrom, right leg. This patient's chief complaints consist of right knee/leg pain when twisting or weight bearing suddenly on that leg, decreased activity tolerance, leading to the following functional deficits: difficulty with usual activities including descending stairs, exercise classes, walking on uneven ground, dynamic balance and mobility, getting out of the tub.     Limitations  Standing;Walking;House hold activities;Other (comment)   stairs, twisting while standing, changing direction, getting out of tub, any time she must catch herself on right leg   Diagnostic tests  Imaging: radiograph report left hip dated 09/27/2018: "IMPRESSION: There is no acute fracture or dislocation of the left hip. There is mild degenerative joint space loss. Of note is degenerative change of the SI joints and  pubic symphysis." radiograph report of right tibia and fibula dated 09/29/2018: "There is no acute or significant chronic abnormality of the prosthetic right knee joint. The native bone of the right tibia fibula reveals no acute abnormality. There are mild degenerative changes about the ankle especially laterally."     Patient Stated Goals  plan to go to [place] this Summer and knows it will be a lot of walking and she wants to be able to do it.     Currently in Pain?  Yes   no pain currently   Pain Score  --   current pain as 0/10, at best 0/10, at worst 7-8/10.   Pain Location  Leg   also has some left hip and low back pain that are chronic and intermittant   Pain Orientation  Lower;Anterior;Proximal    Pain Descriptors / Indicators  Hervey Ard  bilateral peripheral neuropathy   Pain Type  Acute pain    Aggravating Factors   when suddenly have to do something and 'catch hersself" with leg, stairs, walking in a hurry, getting out of the tub.     Pain Relieving Factors  walking, prednisone.     Effect of Pain on Daily Activities  has difficulty trusting her knee will hold her. Has difficulty with stairs, uneven ground, exercises classes, changing directions. Is concerned about her ability to keep up during a trip to [place] this summer        [facility] PT Assessment - 10/12/18 0001      Assessment   Medical Diagnosis  Iliotibial band syndrom, right leg    Referring Provider (PT)  Stilwell, Sueanne Margarita, PA-C    Onset Date/Surgical Date  08/11/18    Prior Therapy  none for this problem      Balance Screen   Has the patient fallen in the past 6 months  Yes    How many times?  2    Has the patient had a decrease in activity level because of a fear of falling?   No    Is the patient reluctant to leave their home because of a fear of falling?   No      Home Environment   Living Environment  Private residence    Living Arrangements  Spouse/significant other    Home Access  Stairs to enter    Entrance  Stairs-Number of Steps  6    Entrance Stairs-Rails  Right;Left;Can reach both    Additional Comments  Walk in shower and tub-shower combo with handrail      Prior Function   Level of Independence  Independent    Vocation  Retired    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     Leisure  Workout, clean houses, with friends and grandkids, likes to draw.       Cognition   Overall Cognitive Status  Within Functional Limits for tasks assessed      Observation/Other Assessments   Observations  see note from 10/11/18 for latest objective measurements    Focus on Therapeutic Outcomes (FOTO)   48          OBJECTIVE: OBSERVATION/INSPECTION: Patient presents with evidence of mature scarring and healing from bilateral TKA. No abnormal heat or redness noted. Low muscle mass noted throughout thigh and buttocks.    NEUROLOGICAL: Dermatomes: BLE intact to light touch and equal bilaterally. Myotomes: BLE WNL  SPINE MOTION Lumbar AROM:  *Indicates pain - Extension: = 50%   PERIPHERAL JOINT MOTION (AROM/PROM in degrees):  *Indicates pain Hip  - Bilateral hip WFL  Knee - Flexion: R = /122, L = /120.  - Extension: R = 5, L = 5. Ankle:  - Bilateral ankles appear WFL.  STRENGTH:  *Indicates pain Hip  - Flexion: R = 5/5, L = 5/5. - Extension: R = 4+5, L = 4+/5. - Abduction: R = 4+/5, L = 4+/5. Knee - Ext: R = 4+/5, L = 4+/5. - Flex: R = 4+/5, L = 4+/5. Ankle (seated position) - B = WFL.  REPEATED MOTIONS TESTING: Prone press up: during = no effect; after = no effect   SPECIAL TESTS: R Ober test = negative  ACCESSORY MOTION:  - CPA to lumbar spine reported "felt good" no change in knee pain (none at baseline).  - No excessive laxity noted in right knee.   PALPATION: -  TTP over right lateral knee joint line and proximal lateral tibia/fibula region.  - Not particularly TTP over distal attachment of ITB on R  FUNCTIONAL MOBILITY: - Bed  mobility: sit <> supine and rolling I - Transfers: sit <> stand I - Gait: ambulates community and household distances I - Stairs: ascends and descends step over step with BUE support I   Objective measurements completed on examination: See above findings.     TREATMENT:  Denies allergies to latex, long term steroid use.    Therapeutic exercise: to centralize symptoms and improve ROM and strength required for successful completion of functional activities.  - Education on diagnosis, prognosis, POC, anatomy and physiology of current condition.  - Education on HEP including handout  - Quads set, trialed in seated with foot on floor and in long sitting x 5 each position with 5 second hold. To decrease knee pain and improve neuromuscular control. Cuing for strong quad contraction.  - prone press ups to address potential referral from lumbar spine. Cuing for relaxation of back and glute muscles. X 10. No effect but pt initially pain free.  - postural correction in seated position with lumbar roll with instructions for home use, to improve lumbar position and decrease irritation during seated postures.  HOME EXERCISE PROGRAM Access Code: [email or web address]  URL: [email or web address]  Date: 10/11/2018  Prepared by: Rosita Kea   Exercises  Supine Quad Set on Towel Roll - 20 reps - 5 second hold - 2 Sets - 2x daily - 7x weekly  Prone Press Up - 10-15 reps - 1 second hold - 4x daily  Seated Correct Posture    Patient response to treatment:  Pt tolerated treatment well. Pt was able to complete all exercises with minimal to no lasting increase in pain or discomfort. Pt required cuing for proper technique and to facilitate improved neuromuscular control, strength, range of motion, and functional ability.          PT Education - 10/12/18 1237    Education provided  Yes    Education Details  Exercise purpose/form. Self management techniques. Education on diagnosis, prognosis, POC,  anatomy and physiology of current condition Education on HEP including handout     Person(s) Educated  Patient    Methods  Explanation;Demonstration;Tactile cues;Verbal cues    Comprehension  Verbalized understanding;Returned demonstration       PT Short Term Goals - 10/12/18 1248      PT SHORT TERM GOAL #1   Title  Be independent with initial home exercise program for self-management of symptoms.    Baseline  iniital HEP provided at IE (10/11/2018);     Time  2    Period  Weeks    Status  New    Target Date  10/26/18        PT Long Term Goals - 10/12/18 1249      PT LONG TERM GOAL #1   Title  Be independent with a long-term home exercise program for self-management of symptoms.     Baseline  iniital HEP provided at IE (10/11/2018);     Time  6    Period  Weeks    Status  New    Target Date  11/23/18      PT LONG TERM GOAL #2   Title  Patient will demonstrate improved ability to perform functional tasks as exhibited by at least 10 point improvement in FOTO score    Baseline  FOTO = 48    Time  6    Period  Weeks    Status  New    Target Date  11/23/18      PT LONG TERM GOAL #3   Title  Patient will demonstrate B LE strength 5/5 to demonstrate functional strength for independent gait, increased standing tolerance, lifting, carrying and increased ADL ability.    Baseline  see objective exam (10/11/2018);     Time  6    Period  Weeks    Status  New    Target Date  11/23/18      PT LONG TERM GOAL #4   Title  Complete community, work and/or recreational activities without limitation due to current condition.     Baseline  limitations in mobility and fitness routine (10/11/2018);    Time  8    Period  Weeks    Status  New    Target Date  11/23/18             Plan - 10/12/18 1241    Clinical Impression Statement  Patient is a 73 y.o. female referred to outpatient physical therapy with a medical diagnosis of Iliotibial band syndrom, right leg who presents with signs  and symptoms consistent with right knee and proximal lower leg pain that appears related to dysfunction at the R knee joint.  Patient also has history of lumbar pain that may be contributing to her left hip region based on her subjective and objective exam. Unable to rule out possible referral from low back to right anterior lower leg at this time. Will continue to monitor and assess primary source of pain in upcoming treatment sessions and modify interventions as appropriate. Patient's complaint is very mild today likely related to taking prednisone but has failed to demonstrate stability in improvement over time at this point. Patient presents with significant impairments including pain; instability; weakness; decreased activity tolerance; decreased knowledge of appropriate self-management techniques that are limiting ability to complete basic mobility (such as ambulation over uneven ground, changing direction and twisting while weight bearing) regular fitness program; stepping out of the bathtub; and participation in usual community and social engagement and household responsibilitie without difficulty and fear of falling or inducing pain. Patient will benefit from skilled physical therapy intervention to address current body structure impairments and activity limitations to improve function and work towards goals set in current POC in order to return to prior level of function or maximal functional improvement.     History and Personal Factors relevant to plan of care:  Diabetes with peripheral neuropaty, sleep apnea, history of uterine cancer, diverticulosis, bilateral TKA, right rotator cuff repair, Barret's Esophagus.  History of chronic lumbar and left hip pain.     Clinical Presentation  Evolving    Clinical Presentation due to:  pt reports worsening prior to recieving prednisone     Clinical Decision Making  Moderate    Rehab Potential  Good    PT Frequency  2x / week    PT Duration  6 weeks    PT  Treatment/Interventions  ADLs/Self Care Home Management;Cryotherapy;Moist Heat;Stair training;Functional mobility training;Therapeutic activities;Therapeutic exercise;Balance training;Neuromuscular re-education;Patient/family education;Manual techniques;Passive range of motion;Dry needling;Taping;Joint Manipulations;Spinal Manipulations;Other (comment)   joint mobilizations grades I-IV   PT Next Visit Plan  assess reponse to HEP and update as appropriate. Continue with progressive LE and funcitonal strengthening and dynamic strength training.     PT Home Exercise Plan  Medbridge Access Code: [email or web address]     Consulted and Agree with Plan of Care  Patient        Patient will benefit from skilled therapeutic intervention in order to improve the following deficits and impairments:  Decreased endurance, Decreased mobility, Difficulty walking, Decreased range of motion, Impaired perceived functional ability, Other (comment), Decreased activity tolerance, Decreased strength, Pain, Impaired sensation(decreased knowledge of appropriate self-management techniques)  Visit Diagnosis: Acute pain of right knee  Pain in left hip  Chronic low back pain, unspecified back pain laterality, unspecified whether sciatica present  Difficulty in walking, not elsewhere classified  Muscle weakness (generalized)     Problem List Patient Active Problem List   Diagnosis Date Noted  . Hip pain, chronic, left 09/29/2018  . Bone pain of lower leg 09/29/2018  . Pain of right tibia 09/29/2018  . Hyperlipidemia associated with type 2 diabetes mellitus ([place]) 03/29/2018  . Bilateral pes planus 03/29/2018  . SUI (stress urinary incontinence, female) 11/23/2017  . Vaginal atrophy 11/23/2017  . Obesity (BMI 30.0-34.9) 06/20/2017  . Carpal tunnel syndrome on right 06/20/2017  . Endometrioid adenocarcinoma of uterus ([place]) 01/19/2017  . OSA on CPAP 11/24/2016  . Cyst of ovary 11/16/2016  . Cervical stenosis (uterine  cervix) 11/16/2016  . Cyst, vulva 10/11/2015  . CKD (chronic kidney disease) stage 3, GFR 30-59 ml/min ([facility]) 11/07/2014  . GERD (gastroesophageal reflux disease) 12/26/2013  . Irritable bowel syndrome with constipation 12/26/2013  . Encounter for preventive health examination 10/21/2013  . Hyperlipidemia LDL goal <100 07/15/2013  . Chronic venous insufficiency 03/21/2013  . DM (diabetes mellitus), type 2 with renal complications ([place]) 03/54/6568  . Lack of libido 11/05/2012  . Barrett's esophagus 11/05/2012  . Hiatal hernia 11/05/2012  . Essential hypertension 11/05/2012    Nancy Nordmann, PT, DPT 10/12/2018, 12:55 PM  [facility] [address] [place], [address] Phone: [phone]   Fax:  [phone]  Name: Sandra Hendrix MRN: [id number] Date of Birth: [birth year]

## 2018-10-15 ENCOUNTER — Ambulatory Visit: Payer: Medicare Other | Admitting: Physical Therapy

## 2018-10-17 ENCOUNTER — Ambulatory Visit: Payer: Medicare Other | Admitting: Physical Therapy

## 2018-10-17 ENCOUNTER — Encounter: Payer: Self-pay | Admitting: Physical Therapy

## 2018-10-17 DIAGNOSIS — M6281 Muscle weakness (generalized): Secondary | ICD-10-CM

## 2018-10-17 DIAGNOSIS — M25552 Pain in left hip: Secondary | ICD-10-CM

## 2018-10-17 DIAGNOSIS — M545 Low back pain, unspecified: Secondary | ICD-10-CM

## 2018-10-17 DIAGNOSIS — G8929 Other chronic pain: Secondary | ICD-10-CM

## 2018-10-17 DIAGNOSIS — R262 Difficulty in walking, not elsewhere classified: Secondary | ICD-10-CM

## 2018-10-17 DIAGNOSIS — M25561 Pain in right knee: Secondary | ICD-10-CM

## 2018-10-17 NOTE — Therapy (Signed)
[facility] [address], [place], [address] Phone: [phone]   Fax:  [phone]  Physical Therapy Treatment  Patient Details  Name: Sandra Hendrix MRN: [id number] Date of Birth: 1946/08/27 Referring Provider (PT): Lajean Manes, [place]   Encounter Date: 10/17/2018  PT End of Session - 10/17/18 1621    Visit Number  2    Number of Visits  12    Date for PT Re-Evaluation  11/23/18    Authorization Type  [employer] reporting period from 10/11/2018    Authorization Time Period  Current cert period: 01/01/6807 - 11/23/2018 (latest PN: IE 10/11/2018)    Authorization - Visit Number  2    Authorization - Number of Visits  10    PT Start Time  8110    PT Stop Time  1655    PT Time Calculation (min)  40 min    Activity Tolerance  Patient tolerated treatment well;No increased pain    Behavior During Therapy  WFL for tasks assessed/performed       Past Medical History:  Diagnosis Date  . Allergy   . Anxiety   . Arthritis   . Barrett's esophagus determined by endoscopy   . Cancer [facility])    Uterine Cancer  . Chronic kidney disease   . Depression   . Diabetes mellitus without complication ([place])   . Difficult intubation    "not sure what happened,asleep was told had problem with intubation"  . Diverticulosis   . GERD (gastroesophageal reflux disease)   . History of bronchitis   . History of hiatal hernia   . Hyperlipidemia   . Hypertension   . Sleep apnea    Use C- PAP  . Urinary incontinence   . Varicose veins     Past Surgical History:  Procedure Laterality Date  . ABDOMINAL HYSTERECTOMY  03/01/2017  . CHOLECYSTECTOMY    . COLONOSCOPY WITH PROPOFOL N/A 06/14/2016   Procedure: COLONOSCOPY WITH PROPOFOL;  Surgeon: Lollie Sails, MD;  Location: [facility] ENDOSCOPY;  Service: Endoscopy;  Laterality: N/A;  . ESOPHAGOGASTRODUODENOSCOPY (EGD) WITH PROPOFOL N/A 06/14/2016   Procedure:  ESOPHAGOGASTRODUODENOSCOPY (EGD) WITH PROPOFOL;  Surgeon: Lollie Sails, MD;  Location: [facility] ENDOSCOPY;  Service: Endoscopy;  Laterality: N/A;  . ESOPHAGOGASTRODUODENOSCOPY (EGD) WITH PROPOFOL N/A 06/12/2017   Procedure: ESOPHAGOGASTRODUODENOSCOPY (EGD) WITH PROPOFOL;  Surgeon: Lollie Sails, MD;  Location: [facility] ENDOSCOPY;  Service: Endoscopy;  Laterality: N/A;  . ESOPHAGOGASTRODUODENOSCOPY (EGD) WITH PROPOFOL N/A 08/25/2017   Procedure: ESOPHAGOGASTRODUODENOSCOPY (EGD) WITH PROPOFOL;  Surgeon: Lollie Sails, MD;  Location: [facility] ENDOSCOPY;  Service: Endoscopy;  Laterality: N/A;  . HYSTEROSCOPY W/D&C N/A 01/02/2017   Procedure: DILATATION AND CURETTAGE /HYSTEROSCOPY;  Surgeon: Brayton Mars, MD;  Location: [facility] ORS;  Service: Gynecology;  Laterality: N/A;  . JOINT REPLACEMENT Right 2007   Total Knee Replacement  . JOINT REPLACEMENT Left 2004   Total Knee Replacement  . LAPAROSCOPIC HYSTERECTOMY Bilateral 03/01/2017   Procedure: HYSTERECTOMY TOTAL LAPAROSCOPIC BSO;  Surgeon: Mellody Drown, MD;  Location: [facility] ORS;  Service: Gynecology;  Laterality: Bilateral;  . ROTATOR CUFF REPAIR Right   . SENTINEL NODE BIOPSY N/A 03/01/2017   Procedure: SENTINEL NODE INJECTION AND BIOPSY;  Surgeon: Mellody Drown, MD;  Location: [facility] ORS;  Service: Gynecology;  Laterality: N/A;  . TONSILLECTOMY     as a child  . TUBAL LIGATION    . UPPER GI ENDOSCOPY     x2  There were no vitals filed for this visit.  Subjective Assessment - 10/17/18 1618    Subjective  Patient report she has been doing well but has been very busy since her last treatment sesion. She reports her knee only hurt her sharply one time when she had to use that leg to stop fast. She has been attending her usual exercise clases and has noticed both her legs feel the same amount of greater fatigue than usual. She states different things have been added to the class. She reports some soreness following last treatment session in  her legs and back. She reports she is performing her HEP regularly and feels like the lumbar extensions are helping her back.     Pertinent History  Patient is a 73 y.o. female who presents to outpatient physical therapy with a referral for medical diagnosis Iliotibial band syndrom, right leg. This patient's chief complaints consist of right knee/leg pain when twisting or weight bearing suddenly on that leg, decreased activity tolerance, leading to the following functional deficits: difficulty with usual activities including descending stairs, exercise classes, walking on uneven ground, dynamic balance and mobility, getting out of the tub.     Limitations  Standing;Walking;House hold activities;Other (comment)   stairs, twisting while standing, changing direction, getting out of tub, any time she must catch herself on right leg   Diagnostic tests  Imaging: radiograph report left hip dated 09/27/2018: "IMPRESSION: There is no acute fracture or dislocation of the left hip. There is mild degenerative joint space loss. Of note is degenerative change of the SI joints and pubic symphysis." radiograph report of right tibia and fibula dated 09/29/2018: "There is no acute or significant chronic abnormality of the prosthetic right knee joint. The native bone of the right tibia fibula reveals no acute abnormality. There are mild degenerative changes about the ankle especially laterally."     Patient Stated Goals  plan to go to [place] this Summer and knows it will be a lot of walking and she wants to be able to do it.     Currently in Pain?  No/denies       TREATMENT:  Denies allergies to latex, long term steroid use.  Pt has 2# ankle weights at home.   Therapeutic exercise: to centralize symptoms and improve ROM, strength, muscular endurance, and activity tolerance required for successful completion of functional activities. Multimodal cuing for proper form.  - Recumbent Bike no added resistance with RPM up to 60.  For improved lower extremity ROM, muscular endurance, and activity tolerance; and to induce the analgesic effect of aerobic exercise, stimulate joint nutrition, and prepare body structures and systems for following interventions. x 5 minutes during subjective exam.  - Quads set, in long sitting x 5 each position with 5 second hold. Over 2 minutes plus extra time for additional cuing to improve technique. To decrease knee pain and improve neuromuscular control. Cuing for strong quad contraction.  - short arc quad with ankle weight, to improve quad strength and knee stability. Cuing for end range quad contraction. X 2 min each side with extra time for transitions and cuing. 10#  - seated long arc quad with ankle weight, to improve quad strength and knee stability. Cuing for end range quad contraction. X 2 min each side with extra time for transitions and cuing. 10#  - hooklying bridge 2x10 for glute, hamstring, and posterior trunk trength - standing hamstring curls with BUE support ankle weights to improve hamstring strength. 2x10 each side  with 5#  - standing hip abduction with narrow BUE support and ankle weights. To improve lateral hip strength. 2x10 each side with 5#. - ball toss with narrow stance and modified tandem stance each side x 10 tosses with CGA for safety to improve knee proprioception.  - squat to buttocks tap on 18inch chair, 2x10. Cuing for form and strong glute contraction. - Education on HEP including handout     HOME EXERCISE PROGRAM Access Code: [email or web address]  URL: [email or web address]  Date: 10/17/2018  Prepared by: Rosita Kea   Exercises  Seated Correct Posture  Prone Press Up - 10-15 reps - 1 second hold - 4x daily  Supine Quad Set on Towel Roll - 20 reps - 5 second hold - 2 Sets - 2x daily - 7x weekly  Supine Bridge - 10-15 reps - 1 second hold - 3 Sets - 1x daily - 3x weekly  Squat with Chair Touch - 10-15 reps - 1 second hold - 3 Sets - 1x daily - 3x  weekly  Standing Hamstring Curl with Chair Support - 10-15 reps - 1 second hold - 3 Sets - 1x daily - 3x weekly  Standing Hip Abduction with Counter Support - 10-15 reps - 1 second hold - 3 Sets - 1x daily - 3x weekly   Patient response to treatment:  Pt tolerated treatment well. Pt was able to complete all exercises with minimal to no lasting increase in pain or discomfort.She was able to progress several hip, knee, and trunk strengthening exercises and progress to weight bearing proprioceptive strengthening. Her HEP was updated to reflect progress.  Pt required cuing for proper technique and to facilitate improved neuromuscular control, strength, range of motion, and functional ability.    PT Education - 10/17/18 1621    Education provided  Yes    Education Details   Exercise purpose/form. Self management techniques. Education on diagnosis, prognosis, POC, anatomy and physiology of current condition    Person(s) Educated  Patient    Methods  Explanation;Demonstration;Tactile cues;Verbal cues    Comprehension  Verbalized understanding;Returned demonstration       PT Short Term Goals - 10/17/18 1625      PT SHORT TERM GOAL #1   Title  Be independent with initial home exercise program for self-management of symptoms.    Baseline  iniital HEP provided at IE (10/11/2018);     Time  2    Period  Weeks    Status  Achieved    Target Date  10/26/18        PT Long Term Goals - 10/12/18 1249      PT LONG TERM GOAL #1   Title  Be independent with a long-term home exercise program for self-management of symptoms.     Baseline  iniital HEP provided at IE (10/11/2018);     Time  6    Period  Weeks    Status  New    Target Date  11/23/18      PT LONG TERM GOAL #2   Title  Patient will demonstrate improved ability to perform functional tasks as exhibited by at least 10 point improvement in FOTO score    Baseline  FOTO = 48    Time  6    Period  Weeks    Status  New    Target Date   11/23/18      PT LONG TERM GOAL #3   Title  Patient will demonstrate B LE strength 5/5 to demonstrate  functional strength for independent gait, increased standing tolerance, lifting, carrying and increased ADL ability.    Baseline  see objective exam (10/11/2018);     Time  6    Period  Weeks    Status  New    Target Date  11/23/18      PT LONG TERM GOAL #4   Title  Complete community, work and/or recreational activities without limitation due to current condition.     Baseline  limitations in mobility and fitness routine (10/11/2018);    Time  8    Period  Weeks    Status  New    Target Date  11/23/18            Plan - 10/17/18 1703    Clinical Impression Statement  Patient is making overall steady progress towards goals at this point. She was able to progress to weight bearing exercises today and reports decreased pain and difficulty with functional activity.     Rehab Potential  Good    PT Frequency  2x / week    PT Duration  6 weeks    PT Treatment/Interventions  ADLs/Self Care Home Management;Cryotherapy;Moist Heat;Stair training;Functional mobility training;Therapeutic activities;Therapeutic exercise;Balance training;Neuromuscular re-education;Patient/family education;Manual techniques;Passive range of motion;Dry needling;Taping;Joint Manipulations;Spinal Manipulations;Other (comment)   joint mobilizations grades I-IV   PT Next Visit Plan  assess reponse to HEP and update as appropriate. Continue with progressive LE and funcitonal strengthening and dynamic strength training.     PT Home Exercise Plan  Medbridge Access Code: [email or web address]     Consulted and Agree with Plan of Care  Patient       Patient will benefit from skilled therapeutic intervention in order to improve the following deficits and impairments:  Decreased endurance, Decreased mobility, Difficulty walking, Decreased range of motion, Impaired perceived functional ability, Other (comment), Decreased activity  tolerance, Decreased strength, Pain, Impaired sensation(decreased knowledge of appropriate self-management techniques)  Visit Diagnosis: Acute pain of right knee  Pain in left hip  Chronic low back pain, unspecified back pain laterality, unspecified whether sciatica present  Difficulty in walking, not elsewhere classified  Muscle weakness (generalized)     Problem List Patient Active Problem List   Diagnosis Date Noted  . Hip pain, chronic, left 09/29/2018  . Bone pain of lower leg 09/29/2018  . Pain of right tibia 09/29/2018  . Hyperlipidemia associated with type 2 diabetes mellitus ([place]) 03/29/2018  . Bilateral pes planus 03/29/2018  . SUI (stress urinary incontinence, female) 11/23/2017  . Vaginal atrophy 11/23/2017  . Obesity (BMI 30.0-34.9) 06/20/2017  . Carpal tunnel syndrome on right 06/20/2017  . Endometrioid adenocarcinoma of uterus ([place]) 01/19/2017  . OSA on CPAP 11/24/2016  . Cyst of ovary 11/16/2016  . Cervical stenosis (uterine cervix) 11/16/2016  . Cyst, vulva 10/11/2015  . CKD (chronic kidney disease) stage 3, GFR 30-59 ml/min ([facility]) 11/07/2014  . GERD (gastroesophageal reflux disease) 12/26/2013  . Irritable bowel syndrome with constipation 12/26/2013  . Encounter for preventive health examination 10/21/2013  . Hyperlipidemia LDL goal <100 07/15/2013  . Chronic venous insufficiency 03/21/2013  . DM (diabetes mellitus), type 2 with renal complications ([place]) 63/09/6008  . Lack of libido 11/05/2012  . Barrett's esophagus 11/05/2012  . Hiatal hernia 11/05/2012  . Essential hypertension 11/05/2012    Nancy Nordmann, PT, DPT 10/18/2018, 7:40 PM  [facility] [address], [place], [address] Phone: [phone]   Fax:  [phone]  Name: Sandra Hobin  Hendrix MRN: [id number] Date of Birth: 1945-10-30

## 2018-10-20 ENCOUNTER — Other Ambulatory Visit: Payer: Self-pay | Admitting: Internal Medicine

## 2018-10-22 ENCOUNTER — Encounter: Payer: Self-pay | Admitting: Physical Therapy

## 2018-10-22 ENCOUNTER — Ambulatory Visit: Payer: Medicare Other | Admitting: Physical Therapy

## 2018-10-22 DIAGNOSIS — M25561 Pain in right knee: Secondary | ICD-10-CM

## 2018-10-22 DIAGNOSIS — M6281 Muscle weakness (generalized): Secondary | ICD-10-CM

## 2018-10-22 DIAGNOSIS — M545 Low back pain, unspecified: Secondary | ICD-10-CM

## 2018-10-22 DIAGNOSIS — G8929 Other chronic pain: Secondary | ICD-10-CM

## 2018-10-22 DIAGNOSIS — M25552 Pain in left hip: Secondary | ICD-10-CM

## 2018-10-22 DIAGNOSIS — R262 Difficulty in walking, not elsewhere classified: Secondary | ICD-10-CM

## 2018-10-22 NOTE — Therapy (Signed)
[facility] [address] [place], [address] Phone: [phone]   Fax:  [phone]  Physical Therapy Treatment  Patient Details  Name: Sandra Hendrix MRN: [id number] Date of Birth: [birth year] Referring Provider (PT): Lajean Manes, [place]   Encounter Date: 10/22/2018  PT End of Session - 10/22/18 1307    Visit Number  3    Number of Visits  12    Date for PT Re-Evaluation  11/23/18    Authorization Type  [employer] reporting period from 10/11/2018    Authorization Time Period  Current cert period: 1/44/8185 - 11/23/2018 (latest PN: IE 10/11/2018)    Authorization - Visit Number  3    Authorization - Number of Visits  10    PT Start Time  1302    PT Stop Time  1342    PT Time Calculation (min)  40 min    Activity Tolerance  Patient tolerated treatment well;No increased pain    Behavior During Therapy  WFL for tasks assessed/performed       Past Medical History:  Diagnosis Date  . Allergy   . Anxiety   . Arthritis   . Barrett's esophagus determined by endoscopy   . Cancer [facility])    Uterine Cancer  . Chronic kidney disease   . Depression   . Diabetes mellitus without complication ([place])   . Difficult intubation    "not sure what happened,asleep was told had problem with intubation"  . Diverticulosis   . GERD (gastroesophageal reflux disease)   . History of bronchitis   . History of hiatal hernia   . Hyperlipidemia   . Hypertension   . Sleep apnea    Use C- PAP  . Urinary incontinence   . Varicose veins     Past Surgical History:  Procedure Laterality Date  . ABDOMINAL HYSTERECTOMY  03/01/2017  . CHOLECYSTECTOMY    . COLONOSCOPY WITH PROPOFOL N/A 06/14/2016   Procedure: COLONOSCOPY WITH PROPOFOL;  Surgeon: Lollie Sails, MD;  Location: [facility] ENDOSCOPY;  Service: Endoscopy;  Laterality: N/A;  . ESOPHAGOGASTRODUODENOSCOPY (EGD) WITH PROPOFOL N/A 06/14/2016   Procedure:  ESOPHAGOGASTRODUODENOSCOPY (EGD) WITH PROPOFOL;  Surgeon: Lollie Sails, MD;  Location: [facility] ENDOSCOPY;  Service: Endoscopy;  Laterality: N/A;  . ESOPHAGOGASTRODUODENOSCOPY (EGD) WITH PROPOFOL N/A 06/12/2017   Procedure: ESOPHAGOGASTRODUODENOSCOPY (EGD) WITH PROPOFOL;  Surgeon: Lollie Sails, MD;  Location: [facility] ENDOSCOPY;  Service: Endoscopy;  Laterality: N/A;  . ESOPHAGOGASTRODUODENOSCOPY (EGD) WITH PROPOFOL N/A 08/25/2017   Procedure: ESOPHAGOGASTRODUODENOSCOPY (EGD) WITH PROPOFOL;  Surgeon: Lollie Sails, MD;  Location: [facility] ENDOSCOPY;  Service: Endoscopy;  Laterality: N/A;  . HYSTEROSCOPY W/D&C N/A 01/02/2017   Procedure: DILATATION AND CURETTAGE /HYSTEROSCOPY;  Surgeon: Brayton Mars, MD;  Location: [facility] ORS;  Service: Gynecology;  Laterality: N/A;  . JOINT REPLACEMENT Right 2007   Total Knee Replacement  . JOINT REPLACEMENT Left 2004   Total Knee Replacement  . LAPAROSCOPIC HYSTERECTOMY Bilateral 03/01/2017   Procedure: HYSTERECTOMY TOTAL LAPAROSCOPIC BSO;  Surgeon: Mellody Drown, MD;  Location: [facility] ORS;  Service: Gynecology;  Laterality: Bilateral;  . ROTATOR CUFF REPAIR Right   . SENTINEL NODE BIOPSY N/A 03/01/2017   Procedure: SENTINEL NODE INJECTION AND BIOPSY;  Surgeon: Mellody Drown, MD;  Location: [facility] ORS;  Service: Gynecology;  Laterality: N/A;  . TONSILLECTOMY     as a child  . TUBAL LIGATION    . UPPER GI ENDOSCOPY     x2  There were no vitals filed for this visit.  Subjective Assessment - 10/22/18 1305    Subjective  Patient reports no pain upon arrival. She notes no excessive sorenes or increased pain following last treatement session. She continues to do her exercise classes (including this morning) without difficulty with her knee. She feels overall her knee is getting much better. She has no questions about her HEP and states she does them about 1x per day.     Pertinent History  Patient is a 73 y.o. female who presents to outpatient physical  therapy with a referral for medical diagnosis Iliotibial band syndrom, right leg. This patient's chief complaints consist of right knee/leg pain when twisting or weight bearing suddenly on that leg, decreased activity tolerance, leading to the following functional deficits: difficulty with usual activities including descending stairs, exercise classes, walking on uneven ground, dynamic balance and mobility, getting out of the tub.     Limitations  Standing;Walking;House hold activities;Other (comment)   stairs, twisting while standing, changing direction, getting out of tub, any time she must catch herself on right leg   Diagnostic tests  Imaging: radiograph report left hip dated 09/27/2018: "IMPRESSION: There is no acute fracture or dislocation of the left hip. There is mild degenerative joint space loss. Of note is degenerative change of the SI joints and pubic symphysis." radiograph report of right tibia and fibula dated 09/29/2018: "There is no acute or significant chronic abnormality of the prosthetic right knee joint. The native bone of the right tibia fibula reveals no acute abnormality. There are mild degenerative changes about the ankle especially laterally."     Patient Stated Goals  plan to go to [place] this Summer and knows it will be a lot of walking and she wants to be able to do it.     Currently in Pain?  No/denies          TREATMENT: Denies allergies to latex, long term steroid use. Pt has 2# ankle weights at home.  Therapeutic exercise:to centralize symptoms and improve ROM, strength, muscular endurance, and activity tolerance required for successful completion of functional activities. Multimodal cuing for proper form.  - Treadmill up to 1.5 mph with SBA for safety. For improved lower extremity ROM, muscular endurance, and activity tolerance; and to induce the analgesic effect of aerobic exercise, stimulate joint nutrition, and prepare body structures and systems for following  interventions. x 6 minutes during subjective exam.  -R  Quads set, in long sitting with 5 second hold. Over 2 minutes plus extra time for additional cuing to improve technique. To decrease knee pain and improve neuromuscular control. Cuing for strong quad contraction.  - short arc quad with ankle weight, to improve quad strength and knee stability. Cuing for end range quad contraction. X 2 min each side with extra time for transitions and cuing. 15#  - seated long arc quad with ankle weight, to improve quad strength and knee stability. Cuing for end range quad contraction. X 2 min each side with extra time for transitions and cuing. 15#  - single leg sit <> stand with PVC pipe in contralateral UE touching floor for support. To improve quad and glute strength and muscular endurance for asymmetrically loaded activities (such as walking on uneven ground) Cuing for strong hip and knee extension. 2x10 each side. - ball toss on compliant surface (airex) with modified tandem stance each side x 20 tosses with CGA for safety to improve knee proprioception.  - standing hamstring curls with  BUE support on single PVC pole held in front of body, ankle weights to improve hamstring strength. 2x10 each side with 5#. CGA for safety.  - standing hip abduction with narrow BUE support on single PVC pole held in front of body, and ankle weights. To improve lateral hip strength. 2x10 each side with 5#. CGA for safety.   HOME EXERCISE PROGRAM Access Code: [email or web address]  URL: [email or web address]  Date: 10/17/2018  Prepared by: Rosita Kea   Exercises   Seated Correct Posture   Prone Press Up - 10-15 reps - 1 second hold - 4x daily   Supine Quad Set on Towel Roll - 20 reps - 5 second hold - 2 Sets - 2x daily - 7x weekly   Supine Bridge - 10-15 reps - 1 second hold - 3 Sets - 1x daily - 3x weekly   Squat with Chair Touch - 10-15 reps - 1 second hold - 3 Sets - 1x daily - 3x weekly   Standing Hamstring  Curl with Chair Support - 10-15 reps - 1 second hold - 3 Sets - 1x daily - 3x weekly   Standing Hip Abduction with Counter Support - 10-15 reps - 1 second hold - 3 Sets - 1x daily - 3x weekly   Patient response to treatment:  Pt tolerated treatment well. Pt was able to complete all exercises with minimal to no lasting increase in pain or discomfort. She was able to progress all exercises and continue progressive weight bearing proprioceptive strengthening. Pt reported appropriate fatigue at end of session. She was educated about possible DOMS and instructed in appropriate response. Pt required cuing for proper technique and to facilitate improved neuromuscular control, strength, range of motion, and functional ability.     PT Education - 10/22/18 1307    Education provided  Yes    Education Details  Exercise purpose/form. Self management techniques. Education on diagnosis, prognosis, POC, anatomy and physiology of current condition    Person(s) Educated  Patient    Methods  Explanation;Demonstration;Tactile cues;Verbal cues    Comprehension  Verbalized understanding;Returned demonstration       PT Short Term Goals - 10/17/18 1625      PT SHORT TERM GOAL #1   Title  Be independent with initial home exercise program for self-management of symptoms.    Baseline  iniital HEP provided at IE (10/11/2018);     Time  2    Period  Weeks    Status  Achieved    Target Date  10/26/18        PT Long Term Goals - 10/12/18 1249      PT LONG TERM GOAL #1   Title  Be independent with a long-term home exercise program for self-management of symptoms.     Baseline  iniital HEP provided at IE (10/11/2018);     Time  6    Period  Weeks    Status  New    Target Date  11/23/18      PT LONG TERM GOAL #2   Title  Patient will demonstrate improved ability to perform functional tasks as exhibited by at least 10 point improvement in FOTO score    Baseline  FOTO = 48    Time  6    Period  Weeks     Status  New    Target Date  11/23/18      PT LONG TERM GOAL #3   Title  Patient will demonstrate B LE strength 5/5 to  demonstrate functional strength for independent gait, increased standing tolerance, lifting, carrying and increased ADL ability.    Baseline  see objective exam (10/11/2018);     Time  6    Period  Weeks    Status  New    Target Date  11/23/18      PT LONG TERM GOAL #4   Title  Complete community, work and/or recreational activities without limitation due to current condition.     Baseline  limitations in mobility and fitness routine (10/11/2018);    Time  8    Period  Weeks    Status  New    Target Date  11/23/18            Plan - 10/22/18 1348    Clinical Impression Statement   Patient is making overall steady progress towards goals at this point. She was able to progress exercises today and continues to report decreased pain and difficulty with functional activity.  Patient is a 73 y.o. female referred to outpatient physical therapy with a medical diagnosis of Iliotibial band syndrom, right leg who presents with signs and symptoms consistent with right knee and proximal lower leg pain that appears related to dysfunction at the R knee joint. Patient also has history of lumbar pain that may be contributing to her left hip region based on her subjective and objective exam. Unable to rule out possible referral from low back to right anterior lower leg at this time. Will continue to monitor and assess primary source of pain in upcoming treatment sessions and modify interventions as appropriate. Patient's complaint is very mild today likely related to taking prednisone but has failed to demonstrate stability in improvement over time at this point. Patient presents with significant impairments including pain; instability; weakness; decreased activity tolerance; decreased knowledge of appropriate self-management techniques that are limiting ability to complete basic mobility (such  as ambulation over uneven ground, changing direction and twisting while weight bearing) regular fitness program; stepping out of the bathtub; and participation in usual community and social engagement and household responsibilitie without difficulty and fear of falling or inducing pain. Patient will benefit from skilled physical therapy intervention to address current body structure impairments and activity limitations to improve function and work towards goals set in current POC in order to return to prior level of function or maximal functional improvement.     Rehab Potential  Good    PT Frequency  2x / week    PT Duration  6 weeks    PT Treatment/Interventions  ADLs/Self Care Home Management;Cryotherapy;Moist Heat;Stair training;Functional mobility training;Therapeutic activities;Therapeutic exercise;Balance training;Neuromuscular re-education;Patient/family education;Manual techniques;Passive range of motion;Dry needling;Taping;Joint Manipulations;Spinal Manipulations;Other (comment)   joint mobilizations grades I-IV   PT Next Visit Plan  assess reponse to HEP and update as appropriate. Continue with progressive LE and funcitonal strengthening and dynamic strength training.     PT Home Exercise Plan  Medbridge Access Code: [email or web address]     Consulted and Agree with Plan of Care  Patient       Patient will benefit from skilled therapeutic intervention in order to improve the following deficits and impairments:  Decreased endurance, Decreased mobility, Difficulty walking, Decreased range of motion, Impaired perceived functional ability, Other (comment), Decreased activity tolerance, Decreased strength, Pain, Impaired sensation(decreased knowledge of appropriate self-management techniques)  Visit Diagnosis: Acute pain of right knee  Pain in left hip  Chronic low back pain, unspecified back pain laterality, unspecified whether sciatica present  Difficulty in walking, not elsewhere  classified  Muscle weakness (generalized)     Problem List Patient Active Problem List   Diagnosis Date Noted  . Hip pain, chronic, left 09/29/2018  . Bone pain of lower leg 09/29/2018  . Pain of right tibia 09/29/2018  . Hyperlipidemia associated with type 2 diabetes mellitus ([place]) 03/29/2018  . Bilateral pes planus 03/29/2018  . SUI (stress urinary incontinence, female) 11/23/2017  . Vaginal atrophy 11/23/2017  . Obesity (BMI 30.0-34.9) 06/20/2017  . Carpal tunnel syndrome on right 06/20/2017  . Endometrioid adenocarcinoma of uterus ([place]) 01/19/2017  . OSA on CPAP 11/24/2016  . Cyst of ovary 11/16/2016  . Cervical stenosis (uterine cervix) 11/16/2016  . Cyst, vulva 10/11/2015  . CKD (chronic kidney disease) stage 3, GFR 30-59 ml/min ([facility]) 11/07/2014  . GERD (gastroesophageal reflux disease) 12/26/2013  . Irritable bowel syndrome with constipation 12/26/2013  . Encounter for preventive health examination 10/21/2013  . Hyperlipidemia LDL goal <100 07/15/2013  . Chronic venous insufficiency 03/21/2013  . DM (diabetes mellitus), type 2 with renal complications ([place]) 28/36/6294  . Lack of libido 11/05/2012  . Barrett's esophagus 11/05/2012  . Hiatal hernia 11/05/2012  . Essential hypertension 11/05/2012    Nancy Nordmann, PT, DPT 10/22/2018, 1:48 PM  [facility] [address] [place], [address] Phone: [phone]   Fax:  [phone]  Name: Sandra Hendrix MRN: [id number] Date of Birth: [birth year]

## 2018-10-23 ENCOUNTER — Ambulatory Visit
Admission: RE | Admit: 2018-10-23 | Discharge: 2018-10-23 | Disposition: A | Discharge status: Home / Self Care | Payer: Medicare Other | Source: Ambulatory Visit | Attending: Obstetrics and Gynecology | Admitting: Obstetrics and Gynecology

## 2018-10-23 DIAGNOSIS — C55 Malignant neoplasm of uterus, part unspecified: Secondary | ICD-10-CM | POA: Insufficient documentation

## 2018-10-23 DIAGNOSIS — Z79899 Other long term (current) drug therapy: Secondary | ICD-10-CM | POA: Insufficient documentation

## 2018-10-24 ENCOUNTER — Encounter: Payer: Self-pay | Admitting: Physical Therapy

## 2018-10-24 ENCOUNTER — Ambulatory Visit: Payer: Medicare Other | Admitting: Physical Therapy

## 2018-10-24 DIAGNOSIS — M25552 Pain in left hip: Secondary | ICD-10-CM

## 2018-10-24 DIAGNOSIS — M25561 Pain in right knee: Secondary | ICD-10-CM

## 2018-10-24 DIAGNOSIS — M545 Low back pain, unspecified: Secondary | ICD-10-CM

## 2018-10-24 DIAGNOSIS — G8929 Other chronic pain: Secondary | ICD-10-CM

## 2018-10-24 DIAGNOSIS — R262 Difficulty in walking, not elsewhere classified: Secondary | ICD-10-CM

## 2018-10-24 DIAGNOSIS — M6281 Muscle weakness (generalized): Secondary | ICD-10-CM

## 2018-10-24 NOTE — Therapy (Signed)
[facility] [address], [place], [address] Phone: [phone]   Fax:  [phone]  Physical Therapy Treatment  Patient Details  Name: Sandra Hendrix MRN: [id number] Date of Birth: 20-Jun-1946 Referring Provider (PT): Lajean Manes, [place]   Encounter Date: 10/24/2018  PT End of Session - 10/24/18 1358    Visit Number  4    Number of Visits  12    Date for PT Re-Evaluation  11/23/18    Authorization Type  [employer] Medicare reporting period from 10/11/2018    Authorization Time Period  Current cert period: 01/17/5360 - 11/23/2018 (latest PN: IE 10/11/2018)    Authorization - Visit Number  4    Authorization - Number of Visits  10    PT Start Time  4431    PT Stop Time  1433    PT Time Calculation (min)  38 min    Activity Tolerance  Patient tolerated treatment well;No increased pain    Behavior During Therapy  WFL for tasks assessed/performed       Past Medical History:  Diagnosis Date  . Allergy   . Anxiety   . Arthritis   . Barrett's esophagus determined by endoscopy   . Cancer [facility])    Uterine Cancer  . Chronic kidney disease   . Depression   . Diabetes mellitus without complication ([place])   . Difficult intubation    "not sure what happened,asleep was told had problem with intubation"  . Diverticulosis   . GERD (gastroesophageal reflux disease)   . History of bronchitis   . History of hiatal hernia   . Hyperlipidemia   . Hypertension   . Sleep apnea    Use C- PAP  . Urinary incontinence   . Varicose veins     Past Surgical History:  Procedure Laterality Date  . ABDOMINAL HYSTERECTOMY  03/01/2017  . CHOLECYSTECTOMY    . COLONOSCOPY WITH PROPOFOL N/A 06/14/2016   Procedure: COLONOSCOPY WITH PROPOFOL;  Surgeon: Lollie Sails, MD;  Location: [facility] ENDOSCOPY;  Service: Endoscopy;  Laterality: N/A;  . ESOPHAGOGASTRODUODENOSCOPY (EGD) WITH PROPOFOL N/A 06/14/2016   Procedure:  ESOPHAGOGASTRODUODENOSCOPY (EGD) WITH PROPOFOL;  Surgeon: Lollie Sails, MD;  Location: [facility] ENDOSCOPY;  Service: Endoscopy;  Laterality: N/A;  . ESOPHAGOGASTRODUODENOSCOPY (EGD) WITH PROPOFOL N/A 06/12/2017   Procedure: ESOPHAGOGASTRODUODENOSCOPY (EGD) WITH PROPOFOL;  Surgeon: Lollie Sails, MD;  Location: [facility] ENDOSCOPY;  Service: Endoscopy;  Laterality: N/A;  . ESOPHAGOGASTRODUODENOSCOPY (EGD) WITH PROPOFOL N/A 08/25/2017   Procedure: ESOPHAGOGASTRODUODENOSCOPY (EGD) WITH PROPOFOL;  Surgeon: Lollie Sails, MD;  Location: [facility] ENDOSCOPY;  Service: Endoscopy;  Laterality: N/A;  . HYSTEROSCOPY W/D&C N/A 01/02/2017   Procedure: DILATATION AND CURETTAGE /HYSTEROSCOPY;  Surgeon: Brayton Mars, MD;  Location: [facility] ORS;  Service: Gynecology;  Laterality: N/A;  . JOINT REPLACEMENT Right 2007   Total Knee Replacement  . JOINT REPLACEMENT Left 2004   Total Knee Replacement  . LAPAROSCOPIC HYSTERECTOMY Bilateral 03/01/2017   Procedure: HYSTERECTOMY TOTAL LAPAROSCOPIC BSO;  Surgeon: Mellody Drown, MD;  Location: [facility] ORS;  Service: Gynecology;  Laterality: Bilateral;  . ROTATOR CUFF REPAIR Right   . SENTINEL NODE BIOPSY N/A 03/01/2017   Procedure: SENTINEL NODE INJECTION AND BIOPSY;  Surgeon: Mellody Drown, MD;  Location: [facility] ORS;  Service: Gynecology;  Laterality: N/A;  . TONSILLECTOMY     as a child  . TUBAL LIGATION    . UPPER GI ENDOSCOPY     x2  There were no vitals filed for this visit.  Subjective Assessment - 10/24/18 1355    Subjective  Patient reports 1-2/10 intermittant pain in right knee and proximal tibial region on the anterior side. She states has already been to her exercise class this morning and had no excessive soreness or pain following her last physical therapy treatment session. She attribute the pain in her knee to doing her HEP with weight lat night.     Pertinent History  Patient is a 73 y.o. female who presents to outpatient physical therapy with a  referral for medical diagnosis Iliotibial band syndrom, right leg. This patient's chief complaints consist of right knee/leg pain when twisting or weight bearing suddenly on that leg, decreased activity tolerance, leading to the following functional deficits: difficulty with usual activities including descending stairs, exercise classes, walking on uneven ground, dynamic balance and mobility, getting out of the tub.     Limitations  Standing;Walking;House hold activities;Other (comment)   stairs, twisting while standing, changing direction, getting out of tub, any time she must catch herself on right leg   Diagnostic tests  Imaging: radiograph report left hip dated 09/27/2018: "IMPRESSION: There is no acute fracture or dislocation of the left hip. There is mild degenerative joint space loss. Of note is degenerative change of the SI joints and pubic symphysis." radiograph report of right tibia and fibula dated 09/29/2018: "There is no acute or significant chronic abnormality of the prosthetic right knee joint. The native bone of the right tibia fibula reveals no acute abnormality. There are mild degenerative changes about the ankle especially laterally."     Patient Stated Goals  plan to go to [place] this Summer and knows it will be a lot of walking and she wants to be able to do it.     Currently in Pain?  Yes    Pain Score  2     Pain Location  Knee    Pain Orientation  Right    Pain Descriptors / Indicators  Aching    Pain Type  Acute pain        TREATMENT: Denies allergies to latex, long term steroid use. Pt has 2# ankle weights at home.  Therapeutic exercise:to centralize symptoms and improve ROM,strength, muscular endurance, and activity tolerancerequired for successful completion of functional activities. Multimodal cuing for proper form.  -Treadmill up to 1.5 mph with SBA for safety. For improved lower extremity ROM, muscular endurance, and activity tolerance; and to induce the  analgesic effect of aerobic exercise, stimulate joint nutrition, and prepare body structures and systems for following interventions. x5 minutesduring subjective exam. -R  SLR with quad set in hooklying. To decrease knee pain and improve neuromuscular control. Cuing for strong quad contraction. x20  - short arc quad with ankle weight, to improve quad strength and knee stability. Cuing for end range quad contraction. 2x15 at 15# - seated long arc quad with ankle weight, to improve quad strength and knee stability. Cuing for end range quad contraction. 2x15 at 15# - standing hamstring curls with BUE support on single PVC pole held in front of body, ankle weights to improve hamstring strength. 2x10 each side with 5#. CGA for safety.  - standing hip abduction with narrow BUE support on single PVC pole held in front of body, and ankle weights. To improve lateral hip strength. 2x10 each side with 5#. CGA for safety.  - single leg sit <> stand with PVC pipe in contralateral UE touching floor for support. To  improve quad and glute strength and muscular endurance for asymmetrically loaded activities (such as walking on uneven ground) Cuing for strong hip and knee extension. x10 each side.  Manual therapy: to reduce pain and tissue tension, improve range of motion, neuromodulation, in order to promote improved ability to complete functional activities. - seated right tibiofibular distraction with rotation to decrease pain.   HOME EXERCISE PROGRAM Access Code: [email or web address]  URL: [email or web address]  Date: 10/17/2018  Prepared by: Rosita Kea   Exercises   Seated Correct Posture   Prone Press Up - 10-15 reps - 1 second hold - 4x daily   Supine Quad Set on Towel Roll - 20 reps - 5 second hold - 2 Sets - 2x daily - 7x weekly   Supine Bridge - 10-15 reps - 1 second hold - 3 Sets - 1x daily - 3x weekly   Squat with Chair Touch - 10-15 reps - 1 second hold - 3 Sets - 1x daily - 3x weekly    Standing Hamstring Curl with Chair Support - 10-15 reps - 1 second hold - 3 Sets - 1x daily - 3x weekly   Standing Hip Abduction with Counter Support - 10-15 reps - 1 second hold - 3 Sets - 1x daily - 3x weekly  Patient response to treatment:  Pt tolerated treatment well. Pt was able to complete all exercises with minimal to no lasting increase in pain or discomfort. Exercises were appropriately challenging today and were not progressed due to recent progression and pain upon arrival. Pt reported appropriate fatigue at end of session. Patient was TTP over right proximal anterior tibia. Pt required cuing for proper technique and to facilitate improved neuromuscular control, strength, range of motion, and functional ability.     PT Education - 10/24/18 1357    Education provided  Yes    Education Details  Exercise purpose/form. Self management techniques. Education on diagnosis, prognosis, POC, anatomy and physiology of current condition    Person(s) Educated  Patient    Methods  Explanation;Demonstration;Tactile cues;Verbal cues    Comprehension  Verbalized understanding;Returned demonstration       PT Short Term Goals - 10/17/18 1625      PT SHORT TERM GOAL #1   Title  Be independent with initial home exercise program for self-management of symptoms.    Baseline  iniital HEP provided at IE (10/11/2018);     Time  2    Period  Weeks    Status  Achieved    Target Date  10/26/18        PT Long Term Goals - 10/12/18 1249      PT LONG TERM GOAL #1   Title  Be independent with a long-term home exercise program for self-management of symptoms.     Baseline  iniital HEP provided at IE (10/11/2018);     Time  6    Period  Weeks    Status  New    Target Date  11/23/18      PT LONG TERM GOAL #2   Title  Patient will demonstrate improved ability to perform functional tasks as exhibited by at least 10 point improvement in FOTO score    Baseline  FOTO = 48    Time  6    Period   Weeks    Status  New    Target Date  11/23/18      PT LONG TERM GOAL #3   Title  Patient will demonstrate B LE strength  5/5 to demonstrate functional strength for independent gait, increased standing tolerance, lifting, carrying and increased ADL ability.    Baseline  see objective exam (10/11/2018);     Time  6    Period  Weeks    Status  New    Target Date  11/23/18      PT LONG TERM GOAL #4   Title  Complete community, work and/or recreational activities without limitation due to current condition.     Baseline  limitations in mobility and fitness routine (10/11/2018);    Time  8    Period  Weeks    Status  New    Target Date  11/23/18            Plan - 10/24/18 1435    Clinical Impression Statement   Patient is making overall steady progress towards goals at this point. She was able to continue exercises today and continues to report overall  decreased pain and difficulty with functional activity. Patient is a 73 y.o. female referred to outpatient physical therapy with a medical diagnosis of Iliotibial band syndrom, right leg who presents with signs and symptoms consistent with right knee and proximal lower leg pain that appears related to dysfunction at the R knee joint. Patient also has history of lumbar pain that may be contributing to her left hip region based on her subjective and objective exam. Unable to rule out possible referral from low back to right anterior lower leg at this time. Will continue to monitor and assess primary source of pain in upcoming treatment sessions and modify interventions as appropriate. Patient's complaint is very mild today likely related to taking prednisone but has failed to demonstrate stability in improvement over time at this point. Patient presents with significant impairments including pain; instability; weakness; decreased activity tolerance; decreased knowledge of appropriate self-management techniques that are limiting ability to complete  basic mobility (such as ambulation over uneven ground, changing direction and twisting while weight bearing) regular fitness program; stepping out of the bathtub; and participation in usual community and social engagement and household responsibilitie without difficulty and fear of falling or inducing pain. Patient will benefit from skilled physical therapy intervention to address current body structure impairments and activity limitations to improve function and work towards goals set in current POC in order to return to prior level of function or maximal functional improvement.     Rehab Potential  Good    PT Frequency  2x / week    PT Duration  6 weeks    PT Treatment/Interventions  ADLs/Self Care Home Management;Cryotherapy;Moist Heat;Stair training;Functional mobility training;Therapeutic activities;Therapeutic exercise;Balance training;Neuromuscular re-education;Patient/family education;Manual techniques;Passive range of motion;Dry needling;Taping;Joint Manipulations;Spinal Manipulations;Other (comment)   joint mobilizations grades I-IV   PT Next Visit Plan  assess reponse to HEP and update as appropriate. Continue with progressive LE and funcitonal strengthening and dynamic strength training.     PT Home Exercise Plan  Medbridge Access Code: [email or web address]     Consulted and Agree with Plan of Care  Patient       Patient will benefit from skilled therapeutic intervention in order to improve the following deficits and impairments:  Decreased endurance, Decreased mobility, Difficulty walking, Decreased range of motion, Impaired perceived functional ability, Other (comment), Decreased activity tolerance, Decreased strength, Pain, Impaired sensation(decreased knowledge of appropriate self-management techniques)  Visit Diagnosis: Acute pain of right knee  Pain in left hip  Chronic low back pain, unspecified back pain laterality, unspecified whether sciatica present  Difficulty in walking,  not  elsewhere classified  Muscle weakness (generalized)     Problem List Patient Active Problem List   Diagnosis Date Noted  . Hip pain, chronic, left 09/29/2018  . Bone pain of lower leg 09/29/2018  . Pain of right tibia 09/29/2018  . Hyperlipidemia associated with type 2 diabetes mellitus ([place]) 03/29/2018  . Bilateral pes planus 03/29/2018  . SUI (stress urinary incontinence, female) 11/23/2017  . Vaginal atrophy 11/23/2017  . Obesity (BMI 30.0-34.9) 06/20/2017  . Carpal tunnel syndrome on right 06/20/2017  . Endometrioid adenocarcinoma of uterus ([place]) 01/19/2017  . OSA on CPAP 11/24/2016  . Cyst of ovary 11/16/2016  . Cervical stenosis (uterine cervix) 11/16/2016  . Cyst, vulva 10/11/2015  . CKD (chronic kidney disease) stage 3, GFR 30-59 ml/min (HCC) 11/07/2014  . GERD (gastroesophageal reflux disease) 12/26/2013  . Irritable bowel syndrome with constipation 12/26/2013  . Encounter for preventive health examination 10/21/2013  . Hyperlipidemia LDL goal <100 07/15/2013  . Chronic venous insufficiency 03/21/2013  . DM (diabetes mellitus), type 2 with renal complications ([place]) 87/86/7672  . Lack of libido 11/05/2012  . Barrett's esophagus 11/05/2012  . Hiatal hernia 11/05/2012  . Essential hypertension 11/05/2012    Nancy Nordmann, PT, DPT 10/24/2018, 2:36 PM  [facility] [address] [place], [address] Phone: [phone]   Fax:  [phone]  Name: Sandra Hendrix MRN: [id number] Date of Birth: October 19, 1945

## 2018-10-25 ENCOUNTER — Telehealth: Payer: Self-pay | Admitting: *Deleted

## 2018-10-25 ENCOUNTER — Telehealth: Payer: Self-pay

## 2018-10-25 MED ORDER — LETROZOLE 2.5 MG PO TABS: 2.5000 mg | ORAL_TABLET | Freq: Every day | ORAL | 2 refills | Status: DC

## 2018-10-25 NOTE — Telephone Encounter (Signed)
[facility] does not have Letrozole. I called [facility] and thye have availabilty for it so I checked with patient and she is agreeable to have prescription sent to [facility]

## 2018-10-25 NOTE — Telephone Encounter (Signed)
Called and notified [name] with her normal bone density test results.  EXAM: DUAL X-RAY ABSORPTIOMETRY (DXA) FOR BONE MINERAL DENSITY  IMPRESSION: Technologist: SCE  PATIENT BIOGRAPHICAL: Name: Sandra Hendrix, Sandra Hendrix Patient ID: [id number] Birth Date: August 30, 1946 Height: 60.5 in. Gender: Female Exam Date: 10/23/2018 Weight: 171.9 lbs. Indications: Advanced Age, Bilateral Knee Replacements, Diabetic, Height Loss, High Risk Meds, Hysterectomy, Postmenopausal Fractures: Left foot Treatments: Astelin, Calcium, Claritin, Flonase, Glipizide, Insulin, Januvia, Letrozole, Metformin, Multi-Vitamin, Vitamin D  ASSESSMENT: The BMD measured at Femur Neck Right is 1.075 g/cm2 with a T-score of 0.3.  This patient is considered NORMAL according to [facility]) criteria.  The quality of the scan is good.  Site Region Measured Measured WHO Young Adult BMD Date       Age      Classification T-score  AP Spine L1-L4 10[age]23/[age] 74.5 Normal 1.0 1.321 g/cm2  DualFemur Neck Right 10/23/2018 74.5 Normal 0.3 1.075 g/cm2  World Health Organization [facility]) criteria for post-menopausal, Caucasian Women: Normal:       T-score at or above -1 SD Osteopenia:   T-score between -1 and -2.5 SD Osteoporosis: T-score at or below -2.5 SD  RECOMMENDATIONS: 1. All patients should optimize calcium and vitamin D intake. 2. Consider FDA-approved medical therapies in postmenopausal women and men aged 61 years and older, based on the following: a. A hip or vertebral(clinical or morphometric) fracture b. T-score < -2.5 at the femoral neck or spine after appropriate evaluation to exclude secondary causes c. Low bone mass (T-score between -1.0 and -2.5 at the femoral neck or spine) and a 10-year probability of a hip fracture > 3% or a 10-year probability of a major osteoporosis-related fracture > 20% based on the [place]-adapted WHO algorithm d. Clinician judgment and/or patient preferences  may indicate treatment for people with 10-year fracture probabilities above or below these levels  FOLLOW-UP: People with diagnosed cases of osteoporosis or at high risk for fracture should have regular bone mineral density tests. For patients eligible for Medicare, routine testing is allowed once every 2 years. The testing frequency can be increased to one year for patients who have rapidly progressing disease, those who are receiving or discontinuing medical therapy to restore bone mass, or have additional risk factors.  I have reviewed this report, and agree with the above findings.  Mark A. Thornton Papas, M.D. [facility]   Electronically Signed   By: Lavonia Dana M.D.   On: 10/23/2018 15:05

## 2018-10-29 ENCOUNTER — Ambulatory Visit: Payer: Medicare Other | Attending: Orthopedic Surgery | Admitting: Physical Therapy

## 2018-10-29 ENCOUNTER — Encounter: Payer: Self-pay | Admitting: Physical Therapy

## 2018-10-29 DIAGNOSIS — G8929 Other chronic pain: Secondary | ICD-10-CM | POA: Insufficient documentation

## 2018-10-29 DIAGNOSIS — M545 Low back pain, unspecified: Secondary | ICD-10-CM

## 2018-10-29 DIAGNOSIS — M25561 Pain in right knee: Secondary | ICD-10-CM | POA: Insufficient documentation

## 2018-10-29 DIAGNOSIS — R262 Difficulty in walking, not elsewhere classified: Secondary | ICD-10-CM | POA: Diagnosis present

## 2018-10-29 DIAGNOSIS — M6281 Muscle weakness (generalized): Secondary | ICD-10-CM | POA: Diagnosis present

## 2018-10-29 DIAGNOSIS — M25552 Pain in left hip: Secondary | ICD-10-CM

## 2018-10-29 NOTE — Therapy (Signed)
[facility] [address] [place], [address] Phone: [phone]   Fax:  [phone]  Physical Therapy Treatment  Patient Details  Name: Sandra Hendrix MRN: [id number] Date of Birth: Feb 07, 1946 Referring Provider (PT): Lajean Manes, [place]   Encounter Date: 10/29/2018  PT End of Session - 10/29/18 1355    Visit Number  5    Number of Visits  12    Date for PT Re-Evaluation  11/23/18    Authorization Type  [employer] reporting period from 10/11/2018    Authorization Time Period  Current cert period: 5/95/6387 - 11/23/2018 (latest PN: IE 10/11/2018)    Authorization - Visit Number  5    Authorization - Number of Visits  10    PT Start Time  5643    PT Stop Time  1435    PT Time Calculation (min)  40 min    Activity Tolerance  Patient tolerated treatment well;No increased pain    Behavior During Therapy  WFL for tasks assessed/performed       Past Medical History:  Diagnosis Date  . Allergy   . Anxiety   . Arthritis   . Barrett's esophagus determined by endoscopy   . Cancer [facility])    Uterine Cancer  . Chronic kidney disease   . Depression   . Diabetes mellitus without complication ([place])   . Difficult intubation    "not sure what happened,asleep was told had problem with intubation"  . Diverticulosis   . GERD (gastroesophageal reflux disease)   . History of bronchitis   . History of hiatal hernia   . Hyperlipidemia   . Hypertension   . Sleep apnea    Use C- PAP  . Urinary incontinence   . Varicose veins     Past Surgical History:  Procedure Laterality Date  . ABDOMINAL HYSTERECTOMY  03/01/2017  . CHOLECYSTECTOMY    . COLONOSCOPY WITH PROPOFOL N/A 06/14/2016   Procedure: COLONOSCOPY WITH PROPOFOL;  Surgeon: Lollie Sails, MD;  Location: [facility] ENDOSCOPY;  Service: Endoscopy;  Laterality: N/A;  . ESOPHAGOGASTRODUODENOSCOPY (EGD) WITH PROPOFOL N/A 06/14/2016   Procedure:  ESOPHAGOGASTRODUODENOSCOPY (EGD) WITH PROPOFOL;  Surgeon: Lollie Sails, MD;  Location: [facility] ENDOSCOPY;  Service: Endoscopy;  Laterality: N/A;  . ESOPHAGOGASTRODUODENOSCOPY (EGD) WITH PROPOFOL N/A 06/12/2017   Procedure: ESOPHAGOGASTRODUODENOSCOPY (EGD) WITH PROPOFOL;  Surgeon: Lollie Sails, MD;  Location: [facility] ENDOSCOPY;  Service: Endoscopy;  Laterality: N/A;  . ESOPHAGOGASTRODUODENOSCOPY (EGD) WITH PROPOFOL N/A 08/25/2017   Procedure: ESOPHAGOGASTRODUODENOSCOPY (EGD) WITH PROPOFOL;  Surgeon: Lollie Sails, MD;  Location: [facility] ENDOSCOPY;  Service: Endoscopy;  Laterality: N/A;  . HYSTEROSCOPY W/D&C N/A 01/02/2017   Procedure: DILATATION AND CURETTAGE /HYSTEROSCOPY;  Surgeon: Brayton Mars, MD;  Location: [facility] ORS;  Service: Gynecology;  Laterality: N/A;  . JOINT REPLACEMENT Right 2007   Total Knee Replacement  . JOINT REPLACEMENT Left 2004   Total Knee Replacement  . LAPAROSCOPIC HYSTERECTOMY Bilateral 03/01/2017   Procedure: HYSTERECTOMY TOTAL LAPAROSCOPIC BSO;  Surgeon: Mellody Drown, MD;  Location: [facility] ORS;  Service: Gynecology;  Laterality: Bilateral;  . ROTATOR CUFF REPAIR Right   . SENTINEL NODE BIOPSY N/A 03/01/2017   Procedure: SENTINEL NODE INJECTION AND BIOPSY;  Surgeon: Mellody Drown, MD;  Location: [facility] ORS;  Service: Gynecology;  Laterality: N/A;  . TONSILLECTOMY     as a child  . TUBAL LIGATION    . UPPER GI ENDOSCOPY     x2  There were no vitals filed for this visit.  Subjective Assessment - 10/29/18 1353    Subjective  Patient reports she is feeling well this afternoon. She was very sore in her bilateral quads starting the day following last treatment sesion and she finally has no soreness there this morning. She report she has not had any knee pain since last session and spent a long time standing 11:30 am - 4:30 pm cooking and feeding kids for an event and did okay with it. She reports her exercises are going well and she did them today. She has  also been to her workout class this morning.     Pertinent History  Patient is a 73 y.o. female who presents to outpatient physical therapy with a referral for medical diagnosis Iliotibial band syndrom, right leg. This patient's chief complaints consist of right knee/leg pain when twisting or weight bearing suddenly on that leg, decreased activity tolerance, leading to the following functional deficits: difficulty with usual activities including descending stairs, exercise classes, walking on uneven ground, dynamic balance and mobility, getting out of the tub.     Limitations  Standing;Walking;House hold activities;Other (comment)   stairs, twisting while standing, changing direction, getting out of tub, any time she must catch herself on right leg   Diagnostic tests  Imaging: radiograph report left hip dated 09/27/2018: "IMPRESSION: There is no acute fracture or dislocation of the left hip. There is mild degenerative joint space loss. Of note is degenerative change of the SI joints and pubic symphysis." radiograph report of right tibia and fibula dated 09/29/2018: "There is no acute or significant chronic abnormality of the prosthetic right knee joint. The native bone of the right tibia fibula reveals no acute abnormality. There are mild degenerative changes about the ankle especially laterally."     Patient Stated Goals  plan to go to [place] this Summer and knows it will be a lot of walking and she wants to be able to do it.     Currently in Pain?  No/denies        TREATMENT: Denies allergies to latex, long term steroid use. Pt has 2# ankle weights at home.  Therapeutic exercise:to centralize symptoms and improve ROM,strength, muscular endurance, and activity tolerancerequired for successful completion of functional activities. Multimodal cuing for proper form.  -Treadmill up to 1.5 mph with SBA for safety.For improved lower extremity ROM, muscular endurance, and activity tolerance; and to  induce the analgesic effect of aerobic exercise, stimulate joint nutrition, and prepare body structures and systems for following interventions. x8minutesduring subjective exam. - standing hamstring curls with BUE supporton single PVC pole held in front of body,ankle weights to improve hamstring strength. 2x10 each side with 7.5#. CGA for safety. - standing hip abduction with narrow BUE supporton single PVC pole held in front of body,and ankle weights. To improve lateral hip strength. 2x10 each side with 5#. CGA for safety. - single leg sit <> stand with PVC pipe in contralateral UE touching floor for support. To improve quad and glute strength and muscular endurance for asymmetrically loaded activities (such as walking on uneven ground) Cuing for strong hip and knee extension. x10 each side. - ball toss on compliant surface (airex) with modified tandem stance each side 2 x 20 tosses with CGA for safety to improve knee proprioception.  - step ups to 6" step with contralateral toe tap on cone resting on next step with touchdown BUE support and cuing to fully extend hips and knees. To improve  functional hip and knee strength and balance. X5, x10 each side.   HOME EXERCISE PROGRAM Access Code: [email or web address]  URL: [email or web address]  Date: 10/17/2018  Prepared by: Rosita Kea   Exercises   Seated Correct Posture   Prone Press Up - 10-15 reps - 1 second hold - 4x daily   Supine Quad Set on Towel Roll - 20 reps - 5 second hold - 2 Sets - 2x daily - 7x weekly   Supine Bridge - 10-15 reps - 1 second hold - 3 Sets - 1x daily - 3x weekly   Squat with Chair Touch - 10-15 reps - 1 second hold - 3 Sets - 1x daily - 3x weekly   Standing Hamstring Curl with Chair Support - 10-15 reps - 1 second hold - 3 Sets - 1x daily - 3x weekly   Standing Hip Abduction with Counter Support - 10-15 reps - 1 second hold - 3 Sets - 1x daily - 3x weekly  Patient response to treatment:  Pt  tolerated treatment well. Pt was able to complete all exercises with minimal to no lasting increase in pain or discomfort. Exercises were minimally progressed due to report of significant DOMS following last treatment session. Pt requests to push herself and complete the same exercises that lead to soreness last treatment session.  Pt reported appropriate fatigue at end of session. Pt requires reminders to fully extend hip and knee when performing step ups.Pt required cuing for proper technique and to facilitate improved neuromuscular control, strength, range of motion, and functional ability.    PT Education - 10/29/18 1355    Education provided  Yes    Education Details  Exercise purpose/form. Self management techniques. Education on diagnosis, prognosis, POC, anatomy and physiology of current condition    Person(s) Educated  Patient    Methods  Explanation;Demonstration;Tactile cues;Verbal cues    Comprehension  Verbalized understanding;Returned demonstration       PT Short Term Goals - 10/17/18 1625      PT SHORT TERM GOAL #1   Title  Be independent with initial home exercise program for self-management of symptoms.    Baseline  iniital HEP provided at IE (10/11/2018);     Time  2    Period  Weeks    Status  Achieved    Target Date  10/26/18        PT Long Term Goals - 10/12/18 1249      PT LONG TERM GOAL #1   Title  Be independent with a long-term home exercise program for self-management of symptoms.     Baseline  iniital HEP provided at IE (10/11/2018);     Time  6    Period  Weeks    Status  New    Target Date  11/23/18      PT LONG TERM GOAL #2   Title  Patient will demonstrate improved ability to perform functional tasks as exhibited by at least 10 point improvement in FOTO score    Baseline  FOTO = 48    Time  6    Period  Weeks    Status  New    Target Date  11/23/18      PT LONG TERM GOAL #3   Title  Patient will demonstrate B LE strength 5/5 to demonstrate  functional strength for independent gait, increased standing tolerance, lifting, carrying and increased ADL ability.    Baseline  see objective exam (10/11/2018);     Time  6  Period  Weeks    Status  New    Target Date  11/23/18      PT LONG TERM GOAL #4   Title  Complete community, work and/or recreational activities without limitation due to current condition.     Baseline  limitations in mobility and fitness routine (10/11/2018);    Time  8    Period  Weeks    Status  New    Target Date  11/23/18            Plan - 10/29/18 1719    Clinical Impression Statement  Patient continues to make steady progress overall towards goals at this point. She was able to continue progressive exercises today and continues to report overall decreased pain and difficulty with functional activity. She may be approaching readiness for discharge in the next two weeks. Patient is a 73 y.o. female referred to outpatient physical therapy with a medical diagnosis of Iliotibial band syndrom, right leg who presents with signs and symptoms consistent with right knee and proximal lower leg pain that appears related to dysfunction at the R knee joint. Patient presents with significant impairments including pain; instability; weakness; decreased activity tolerance; decreased knowledge of appropriate self-management techniques that are limiting ability to complete basic mobility (such as ambulation over uneven ground, changing direction and twisting while weight bearing) regular fitness program; stepping out of the bathtub; and participation in usual community and social engagement and household responsibilitie without difficulty and fear of falling or inducing pain. Patient will benefit from skilled physical therapy intervention to address current body structure impairments and activity limitations to improve function and work towards goals set in current POC in order to return to prior level of function or maximal  functional improvemen    Rehab Potential  Good    PT Frequency  2x / week    PT Duration  6 weeks    PT Treatment/Interventions  ADLs/Self Care Home Management;Cryotherapy;Moist Heat;Stair training;Functional mobility training;Therapeutic activities;Therapeutic exercise;Balance training;Neuromuscular re-education;Patient/family education;Manual techniques;Passive range of motion;Dry needling;Taping;Joint Manipulations;Spinal Manipulations;Other (comment)   joint mobilizations grades I-IV   PT Next Visit Plan  assess reponse to HEP and update as appropriate. Continue with progressive LE and funcitonal strengthening and dynamic strength training.     PT Home Exercise Plan  Medbridge Access Code: [email or web address]     Consulted and Agree with Plan of Care  Patient       Patient will benefit from skilled therapeutic intervention in order to improve the following deficits and impairments:  Decreased endurance, Decreased mobility, Difficulty walking, Decreased range of motion, Impaired perceived functional ability, Other (comment), Decreased activity tolerance, Decreased strength, Pain, Impaired sensation(decreased knowledge of appropriate self-management techniques)  Visit Diagnosis: Acute pain of right knee  Pain in left hip  Chronic low back pain, unspecified back pain laterality, unspecified whether sciatica present  Difficulty in walking, not elsewhere classified  Muscle weakness (generalized)     Problem List Patient Active Problem List   Diagnosis Date Noted  . Hip pain, chronic, left 09/29/2018  . Bone pain of lower leg 09/29/2018  . Pain of right tibia 09/29/2018  . Hyperlipidemia associated with type 2 diabetes mellitus ([name]) 03/29/2018  . Bilateral pes planus 03/29/2018  . SUI (stress urinary incontinence, female) 11/23/2017  . Vaginal atrophy 11/23/2017  . Obesity (BMI 30.0-34.9) 06/20/2017  . Carpal tunnel syndrome on right 06/20/2017  . Endometrioid adenocarcinoma of uterus  ([place]) 01/19/2017  . OSA on CPAP 11/24/2016  . Cyst of ovary  11/16/2016  . Cervical stenosis (uterine cervix) 11/16/2016  . Cyst, vulva 10/11/2015  . CKD (chronic kidney disease) stage 3, GFR 30-59 ml/min ([facility]) 11/07/2014  . GERD (gastroesophageal reflux disease) 12/26/2013  . Irritable bowel syndrome with constipation 12/26/2013  . Encounter for preventive health examination 10/21/2013  . Hyperlipidemia LDL goal <100 07/15/2013  . Chronic venous insufficiency 03/21/2013  . DM (diabetes mellitus), type 2 with renal complications ([place]) 07/61/5183  . Lack of libido 11/05/2012  . Barrett's esophagus 11/05/2012  . Hiatal hernia 11/05/2012  . Essential hypertension 11/05/2012    Nancy Nordmann, PT, DPT 10/29/2018, 5:22 PM  [facility] [address] [place] [address] [place], [address] Phone: [phone]   Fax:  [phone]  Name: CRYSTALANN KORF MRN: [id number] Date of Birth: [birth year]

## 2018-10-31 DIAGNOSIS — M25561 Pain in right knee: Secondary | ICD-10-CM | POA: Insufficient documentation

## 2018-11-01 ENCOUNTER — Encounter: Payer: Self-pay | Admitting: Physical Therapy

## 2018-11-01 ENCOUNTER — Telehealth: Payer: Self-pay | Admitting: Physical Therapy

## 2018-11-01 ENCOUNTER — Ambulatory Visit: Payer: Medicare Other | Admitting: Physical Therapy

## 2018-11-01 DIAGNOSIS — M25561 Pain in right knee: Secondary | ICD-10-CM | POA: Diagnosis not present

## 2018-11-01 DIAGNOSIS — M545 Low back pain, unspecified: Secondary | ICD-10-CM

## 2018-11-01 DIAGNOSIS — M6281 Muscle weakness (generalized): Secondary | ICD-10-CM

## 2018-11-01 DIAGNOSIS — G8929 Other chronic pain: Secondary | ICD-10-CM

## 2018-11-01 DIAGNOSIS — R262 Difficulty in walking, not elsewhere classified: Secondary | ICD-10-CM

## 2018-11-01 DIAGNOSIS — M25552 Pain in left hip: Secondary | ICD-10-CM

## 2018-11-01 NOTE — Therapy (Signed)
[facility] [address] [place], [address] Phone: [phone]   Fax:  [phone]  Physical Therapy Treatment  Patient Details  Name: Sandra Hendrix MRN: [id number] Date of Birth: [birth year] Referring Provider (PT): Lajean Manes, [place]   Encounter Date: 11/01/2018  PT End of Session - 11/01/18 1314    Visit Number  6    Number of Visits  12    Date for PT Re-Evaluation  11/23/18    Authorization Type  [employer] Medicare reporting period from 10/11/2018    Authorization Time Period  Current cert period: 04/14/9469 - 11/23/2018 (latest PN: IE 10/11/2018)    Authorization - Visit Number  6    Authorization - Number of Visits  10    PT Start Time  9628    PT Stop Time  1350    PT Time Calculation (min)  45 min    Activity Tolerance  Patient tolerated treatment well;No increased pain    Behavior During Therapy  WFL for tasks assessed/performed       Past Medical History:  Diagnosis Date  . Allergy   . Anxiety   . Arthritis   . Barrett's esophagus determined by endoscopy   . Cancer [facility])    Uterine Cancer  . Chronic kidney disease   . Depression   . Diabetes mellitus without complication ([place])   . Difficult intubation    "not sure what happened,asleep was told had problem with intubation"  . Diverticulosis   . GERD (gastroesophageal reflux disease)   . History of bronchitis   . History of hiatal hernia   . Hyperlipidemia   . Hypertension   . Sleep apnea    Use C- PAP  . Urinary incontinence   . Varicose veins     Past Surgical History:  Procedure Laterality Date  . ABDOMINAL HYSTERECTOMY  03/01/2017  . CHOLECYSTECTOMY    . COLONOSCOPY WITH PROPOFOL N/A 06/14/2016   Procedure: COLONOSCOPY WITH PROPOFOL;  Surgeon: Lollie Sails, MD;  Location: [facility] ENDOSCOPY;  Service: Endoscopy;  Laterality: N/A;  . ESOPHAGOGASTRODUODENOSCOPY (EGD) WITH PROPOFOL N/A 06/14/2016   Procedure:  ESOPHAGOGASTRODUODENOSCOPY (EGD) WITH PROPOFOL;  Surgeon: Lollie Sails, MD;  Location: [facility] ENDOSCOPY;  Service: Endoscopy;  Laterality: N/A;  . ESOPHAGOGASTRODUODENOSCOPY (EGD) WITH PROPOFOL N/A 06/12/2017   Procedure: ESOPHAGOGASTRODUODENOSCOPY (EGD) WITH PROPOFOL;  Surgeon: Lollie Sails, MD;  Location: [facility] ENDOSCOPY;  Service: Endoscopy;  Laterality: N/A;  . ESOPHAGOGASTRODUODENOSCOPY (EGD) WITH PROPOFOL N/A 08/25/2017   Procedure: ESOPHAGOGASTRODUODENOSCOPY (EGD) WITH PROPOFOL;  Surgeon: Lollie Sails, MD;  Location: [facility] ENDOSCOPY;  Service: Endoscopy;  Laterality: N/A;  . HYSTEROSCOPY W/D&C N/A 01/02/2017   Procedure: DILATATION AND CURETTAGE /HYSTEROSCOPY;  Surgeon: Brayton Mars, MD;  Location: [facility] ORS;  Service: Gynecology;  Laterality: N/A;  . JOINT REPLACEMENT Right 2007   Total Knee Replacement  . JOINT REPLACEMENT Left 2004   Total Knee Replacement  . LAPAROSCOPIC HYSTERECTOMY Bilateral 03/01/2017   Procedure: HYSTERECTOMY TOTAL LAPAROSCOPIC BSO;  Surgeon: Mellody Drown, MD;  Location: [facility] ORS;  Service: Gynecology;  Laterality: Bilateral;  . ROTATOR CUFF REPAIR Right   . SENTINEL NODE BIOPSY N/A 03/01/2017   Procedure: SENTINEL NODE INJECTION AND BIOPSY;  Surgeon: Mellody Drown, MD;  Location: [facility] ORS;  Service: Gynecology;  Laterality: N/A;  . TONSILLECTOMY     as a child  . TUBAL LIGATION    . UPPER GI ENDOSCOPY     x2  There were no vitals filed for this visit.  Subjective Assessment - 11/01/18 1310    Subjective  Patient reports she is feeling well. She has a small amount of discomfort at the left lower anterior leg after walking a lot yesterday, working out, and cleaning. She reports she saw her referring physician yesterday who said to continue PT and complete her POC but that she was released from his care for her knee at this point.     Pertinent History  Patient is a 73 y.o. female who presents to outpatient physical therapy with a  referral for medical diagnosis Iliotibial band syndrom, right leg. This patient's chief complaints consist of right knee/leg pain when twisting or weight bearing suddenly on that leg, decreased activity tolerance, leading to the following functional deficits: difficulty with usual activities including descending stairs, exercise classes, walking on uneven ground, dynamic balance and mobility, getting out of the tub.     Limitations  Standing;Walking;House hold activities;Other (comment)   stairs, twisting while standing, changing direction, getting out of tub, any time she must catch herself on right leg   Diagnostic tests  Imaging: radiograph report left hip dated 09/27/2018: "IMPRESSION: There is no acute fracture or dislocation of the left hip. There is mild degenerative joint space loss. Of note is degenerative change of the SI joints and pubic symphysis." radiograph report of right tibia and fibula dated 09/29/2018: "There is no acute or significant chronic abnormality of the prosthetic right knee joint. The native bone of the right tibia fibula reveals no acute abnormality. There are mild degenerative changes about the ankle especially laterally."     Patient Stated Goals  plan to go to [place] this Summer and knows it will be a lot of walking and she wants to be able to do it.     Currently in Pain?  No/denies        TREATMENT: Denies allergies to latex, long term steroid use. Pt has 2# ankle weights at home.  Therapeutic exercise:to centralize symptoms and improve ROM,strength, muscular endurance, and activity tolerancerequired for successful completion of functional activities. Multimodal cuing for proper form.  -Treadmill up to 1.5 mph with SBA for safety.For improved lower extremity ROM, muscular endurance, and activity tolerance; and to induce the analgesic effect of aerobic exercise, stimulate joint nutrition, and prepare body structures and systems for following interventions.  x48minutesduring subjective exam. - standing hamstring curls with BUE supporton single PVC pole held in front of body,ankle weights to improve hamstring strength. 2x10 each side with 10#. CGA for safety. - standing hip abduction with narrow BUE supporton single PVC pole held in front of body,and ankle weights. To improve lateral hip strength. 2x10 each side with 10#. CGA for safety. - single leg sit <> stand to standard chair with PVC pipe incontralateral UE touching floor for support. Min A at times to complete movement.  To improve quad and glute strength and muscular endurance for asymmetrically loaded activities (such as walking on uneven ground) Cuing for strong hip and knee extension. x10 each side. - ball (2kg) tosson compliant surface (airex)with modified tandem stance each side 2 x20tosses with CGA for safety to improve knee proprioception.  - step ups to 6" step with no BUE support and cuing to fully extend hips and knees. To improve functional hip and knee strength and balance. CGA - min A to prevent falls. Occasional LOB with ability to self correct but not under good control. 2x10 each side.  - Side stepping  with red theraband wrapped around legs and held at umbilicus to activate core and hip stabilizers and abductors in functional weightbearing position. Cuing for posture. Stepping a few steps out and in as allowed by band 2x10 feet each side.   HOME EXERCISE PROGRAM Access Code: [email or web address]  URL: [email or web address]  Date: 10/17/2018  Prepared by: Rosita Kea   Exercises   Seated Correct Posture   Prone Press Up - 10-15 reps - 1 second hold - 4x daily   Supine Quad Set on Towel Roll - 20 reps - 5 second hold - 2 Sets - 2x daily - 7x weekly   Supine Bridge - 10-15 reps - 1 second hold - 3 Sets - 1x daily - 3x weekly   Squat with Chair Touch - 10-15 reps - 1 second hold - 3 Sets - 1x daily - 3x weekly   Standing Hamstring Curl with Chair Support -  10-15 reps - 1 second hold - 3 Sets - 1x daily - 3x weekly   Standing Hip Abduction with Counter Support - 10-15 reps - 1 second hold - 3 Sets - 1x daily - 3x weekly  Patient response to treatment:  Pt tolerated treatment well. Pt was able to complete all exercises with minimal to no lasting increase in pain or discomfort. She is showing good tolerance for progressions with minimal to no DOMS following last session. She was able to progress balance exercises today but required assistance to maintain safety.  Pt reported appropriate fatigue but no painat end of session. Pt requires reminders to fully extend hip and knee when performing step ups.Pt required cuing for proper technique and to facilitate improved neuromuscular control, strength, range of motion, and functional ability.     PT Education - 11/01/18 1312    Education provided  Yes    Education Details  Exercise purpose/form. Self management techniques. Education on diagnosis, prognosis, POC, anatomy and physiology of current condition    Person(s) Educated  Patient    Methods  Explanation;Demonstration;Tactile cues;Verbal cues    Comprehension  Verbalized understanding;Returned demonstration       PT Short Term Goals - 10/17/18 1625      PT SHORT TERM GOAL #1   Title  Be independent with initial home exercise program for self-management of symptoms.    Baseline  iniital HEP provided at IE (10/11/2018);     Time  2    Period  Weeks    Status  Achieved    Target Date  10/26/18        PT Long Term Goals - 10/12/18 1249      PT LONG TERM GOAL #1   Title  Be independent with a long-term home exercise program for self-management of symptoms.     Baseline  iniital HEP provided at IE (10/11/2018);     Time  6    Period  Weeks    Status  New    Target Date  11/23/18      PT LONG TERM GOAL #2   Title  Patient will demonstrate improved ability to perform functional tasks as exhibited by at least 10 point improvement in FOTO  score    Baseline  FOTO = 48    Time  6    Period  Weeks    Status  New    Target Date  11/23/18      PT LONG TERM GOAL #3   Title  Patient will demonstrate B LE strength 5/5 to demonstrate  functional strength for independent gait, increased standing tolerance, lifting, carrying and increased ADL ability.    Baseline  see objective exam (10/11/2018);     Time  6    Period  Weeks    Status  New    Target Date  11/23/18      PT LONG TERM GOAL #4   Title  Complete community, work and/or recreational activities without limitation due to current condition.     Baseline  limitations in mobility and fitness routine (10/11/2018);    Time  8    Period  Weeks    Status  New    Target Date  11/23/18            Plan - 11/01/18 1403    Clinical Impression Statement  Patient continues to make steady progress overall towards goals at this point. She is showing improvements in balance, activity tolerance and functional strength with reports of decreased pain. She may be approaching readiness for discharge in the next two weeks. Patient is a 73 y.o. female referred to outpatient physical therapy with a medical diagnosis of Iliotibial band syndrom, right leg who presents with signs and symptoms consistent with right knee and proximal lower leg pain that appears related to dysfunction at the R knee joint. Patient presents with significant impairments including pain; instability; weakness; decreased activity tolerance; decreased knowledge of appropriate self-management techniques that are limiting ability to complete basic mobility (such as ambulation over uneven ground, changing direction and twisting while weight bearing) regular fitness program; stepping out of the bathtub; and participation in usual community and social engagement and household responsibilitie without difficulty and fear of falling or inducing pain. Patient will benefit from skilled physical therapy intervention to address current body  structure impairments and activity limitations to improve function and work towards goals set in current POC in order to return to prior level of function or maximal functional improvemen    Rehab Potential  Good    PT Frequency  2x / week    PT Duration  6 weeks    PT Treatment/Interventions  ADLs/Self Care Home Management;Cryotherapy;Moist Heat;Stair training;Functional mobility training;Therapeutic activities;Therapeutic exercise;Balance training;Neuromuscular re-education;Patient/family education;Manual techniques;Passive range of motion;Dry needling;Taping;Joint Manipulations;Spinal Manipulations;Other (comment)   joint mobilizations grades I-IV   PT Next Visit Plan  Continue with progressive LE and funcitonal strengthening and dynamic strength training.     PT Home Exercise Plan  Medbridge Access Code: [email or web address]     Consulted and Agree with Plan of Care  Patient       Patient will benefit from skilled therapeutic intervention in order to improve the following deficits and impairments:  Decreased endurance, Decreased mobility, Difficulty walking, Decreased range of motion, Impaired perceived functional ability, Other (comment), Decreased activity tolerance, Decreased strength, Pain, Impaired sensation(decreased knowledge of appropriate self-management techniques)  Visit Diagnosis: Acute pain of right knee  Pain in left hip  Chronic low back pain, unspecified back pain laterality, unspecified whether sciatica present  Difficulty in walking, not elsewhere classified  Muscle weakness (generalized)     Problem List Patient Active Problem List   Diagnosis Date Noted  . Hip pain, chronic, left 09/29/2018  . Bone pain of lower leg 09/29/2018  . Pain of right tibia 09/29/2018  . Hyperlipidemia associated with type 2 diabetes mellitus ([place]) 03/29/2018  . Bilateral pes planus 03/29/2018  . SUI (stress urinary incontinence, female) 11/23/2017  . Vaginal atrophy 11/23/2017  . Obesity  (BMI 30.0-34.9) 06/20/2017  . Carpal tunnel syndrome on  right 06/20/2017  . Endometrioid adenocarcinoma of uterus ([employer]) 01/19/2017  . OSA on CPAP 11/24/2016  . Cyst of ovary 11/16/2016  . Cervical stenosis (uterine cervix) 11/16/2016  . Cyst, vulva 10/11/2015  . CKD (chronic kidney disease) stage 3, GFR 30-59 ml/min ([facility]) 11/07/2014  . GERD (gastroesophageal reflux disease) 12/26/2013  . Irritable bowel syndrome with constipation 12/26/2013  . Encounter for preventive health examination 10/21/2013  . Hyperlipidemia LDL goal <100 07/15/2013  . Chronic venous insufficiency 03/21/2013  . DM (diabetes mellitus), type 2 with renal complications ([place]) 94/49/6759  . Lack of libido 11/05/2012  . Barrett's esophagus 11/05/2012  . Hiatal hernia 11/05/2012  . Essential hypertension 11/05/2012    Nancy Nordmann, PT, DPT 11/01/2018, 2:04 PM  [facility] [address] [place], [address] Phone: [phone]   Fax:  [phone]  Name: Sandra Hendrix MRN: [id number] Date of Birth: [birth year]

## 2018-11-01 NOTE — Telephone Encounter (Signed)
Called patient after she no-showed to appt at 11:15am. She said she had it written down for 11:45am but was able to reschedule for 1:00pm

## 2018-11-05 ENCOUNTER — Encounter: Payer: Self-pay | Admitting: Physical Therapy

## 2018-11-05 ENCOUNTER — Ambulatory Visit: Payer: Medicare Other | Admitting: Physical Therapy

## 2018-11-05 DIAGNOSIS — G8929 Other chronic pain: Secondary | ICD-10-CM

## 2018-11-05 DIAGNOSIS — R262 Difficulty in walking, not elsewhere classified: Secondary | ICD-10-CM

## 2018-11-05 DIAGNOSIS — M545 Low back pain, unspecified: Secondary | ICD-10-CM

## 2018-11-05 DIAGNOSIS — M25561 Pain in right knee: Secondary | ICD-10-CM | POA: Diagnosis not present

## 2018-11-05 DIAGNOSIS — M6281 Muscle weakness (generalized): Secondary | ICD-10-CM

## 2018-11-05 DIAGNOSIS — M25552 Pain in left hip: Secondary | ICD-10-CM

## 2018-11-05 NOTE — Therapy (Signed)
[facility] [address], [place], [address] Phone: [phone]   Fax:  [phone]  Physical Therapy Treatment  Patient Details  Name: Sandra Hendrix MRN: [id number] Date of Birth: 12/29/1945 Referring Provider (PT): Lajean Manes, [place]   Encounter Date: 11/05/2018  PT End of Session - 11/05/18 1312    Visit Number  7    Number of Visits  12    Date for PT Re-Evaluation  11/23/18    Authorization Type  [employer] reporting period from 10/11/2018    Authorization Time Period  Current cert period: 02/10/6159 - 11/23/2018 (latest PN: IE 10/11/2018)    Authorization - Visit Number  7    Authorization - Number of Visits  10    PT Start Time  7371    PT Stop Time  1345    PT Time Calculation (min)  40 min    Activity Tolerance  Patient tolerated treatment well;No increased pain    Behavior During Therapy  WFL for tasks assessed/performed       Past Medical History:  Diagnosis Date  . Allergy   . Anxiety   . Arthritis   . Barrett's esophagus determined by endoscopy   . Cancer [facility])    Uterine Cancer  . Chronic kidney disease   . Depression   . Diabetes mellitus without complication ([place])   . Difficult intubation    "not sure what happened,asleep was told had problem with intubation"  . Diverticulosis   . GERD (gastroesophageal reflux disease)   . History of bronchitis   . History of hiatal hernia   . Hyperlipidemia   . Hypertension   . Sleep apnea    Use C- PAP  . Urinary incontinence   . Varicose veins     Past Surgical History:  Procedure Laterality Date  . ABDOMINAL HYSTERECTOMY  03/01/2017  . CHOLECYSTECTOMY    . COLONOSCOPY WITH PROPOFOL N/A 06/14/2016   Procedure: COLONOSCOPY WITH PROPOFOL;  Surgeon: Lollie Sails, MD;  Location: [facility] ENDOSCOPY;  Service: Endoscopy;  Laterality: N/A;  . ESOPHAGOGASTRODUODENOSCOPY (EGD) WITH PROPOFOL N/A 06/14/2016   Procedure:  ESOPHAGOGASTRODUODENOSCOPY (EGD) WITH PROPOFOL;  Surgeon: Lollie Sails, MD;  Location: [facility] ENDOSCOPY;  Service: Endoscopy;  Laterality: N/A;  . ESOPHAGOGASTRODUODENOSCOPY (EGD) WITH PROPOFOL N/A 06/12/2017   Procedure: ESOPHAGOGASTRODUODENOSCOPY (EGD) WITH PROPOFOL;  Surgeon: Lollie Sails, MD;  Location: [facility] ENDOSCOPY;  Service: Endoscopy;  Laterality: N/A;  . ESOPHAGOGASTRODUODENOSCOPY (EGD) WITH PROPOFOL N/A 08/25/2017   Procedure: ESOPHAGOGASTRODUODENOSCOPY (EGD) WITH PROPOFOL;  Surgeon: Lollie Sails, MD;  Location: [facility] ENDOSCOPY;  Service: Endoscopy;  Laterality: N/A;  . HYSTEROSCOPY W/D&C N/A 01/02/2017   Procedure: DILATATION AND CURETTAGE /HYSTEROSCOPY;  Surgeon: Brayton Mars, MD;  Location: [facility] ORS;  Service: Gynecology;  Laterality: N/A;  . JOINT REPLACEMENT Right 2007   Total Knee Replacement  . JOINT REPLACEMENT Left 2004   Total Knee Replacement  . LAPAROSCOPIC HYSTERECTOMY Bilateral 03/01/2017   Procedure: HYSTERECTOMY TOTAL LAPAROSCOPIC BSO;  Surgeon: Mellody Drown, MD;  Location: [facility] ORS;  Service: Gynecology;  Laterality: Bilateral;  . ROTATOR CUFF REPAIR Right   . SENTINEL NODE BIOPSY N/A 03/01/2017   Procedure: SENTINEL NODE INJECTION AND BIOPSY;  Surgeon: Mellody Drown, MD;  Location: [facility] ORS;  Service: Gynecology;  Laterality: N/A;  . TONSILLECTOMY     as a child  . TUBAL LIGATION    . UPPER GI ENDOSCOPY     x2  There were no vitals filed for this visit.  Subjective Assessment - 11/05/18 1307    Subjective  Patient reports she is feeling well. She states she continues to have some discomfort in the right lower anterior leg that doesn't seem to get better or worse. She had some mild left hip soreness following last treatment session but she reports this comes and goes. She went to her exericse class this morning without difficulty.     Pertinent History  Patient is a 73 y.o. female who presents to outpatient physical therapy with a  referral for medical diagnosis Iliotibial band syndrom, right leg. This patient's chief complaints consist of right knee/leg pain when twisting or weight bearing suddenly on that leg, decreased activity tolerance, leading to the following functional deficits: difficulty with usual activities including descending stairs, exercise classes, walking on uneven ground, dynamic balance and mobility, getting out of the tub.     Limitations  Standing;Walking;House hold activities;Other (comment)   stairs, twisting while standing, changing direction, getting out of tub, any time she must catch herself on right leg   Diagnostic tests  Imaging: radiograph report left hip dated 09/27/2018: "IMPRESSION: There is no acute fracture or dislocation of the left hip. There is mild degenerative joint space loss. Of note is degenerative change of the SI joints and pubic symphysis." radiograph report of right tibia and fibula dated 09/29/2018: "There is no acute or significant chronic abnormality of the prosthetic right knee joint. The native bone of the right tibia fibula reveals no acute abnormality. There are mild degenerative changes about the ankle especially laterally."     Patient Stated Goals  plan to go to [place] this Summer and knows it will be a lot of walking and she wants to be able to do it.          TREATMENT: Denies allergies to latex, long term steroid use. Pt has 2# ankle weights at home.  Therapeutic exercise:to centralize symptoms and improve ROM,strength, muscular endurance, and activity tolerancerequired for successful completion of functional activities. Multimodal cuing for proper form.  -Treadmill up to 2.70mph with SBA for safety.For improved lower extremity ROM, muscular endurance, and activity tolerance; and to induce the analgesic effect of aerobic exercise, stimulate joint nutrition, and prepare body structures and systems for following interventions. x66minutesduring subjective exam. -  standing hip abduction with narrow BUE support. To improve lateral hip strength. Yellow theraband loop 2x15. Cuing for posture and to keep toes forward.  - standing hamstring curls with BUE with narrow grip. to improve hamstring strength. 2x15 each side with yellow theraband looped around ankles. Cuing for posture and technique.  - heel raises on 20 degree slant board with BUE support to stretch and strengthen gastrocs. Cuing for posture. X 20.  - single leg sit <> stand to standard chair with PVC pipe incontralateral UE touching floor for support. SBA- min A at times for safety. Compensation with leg on back of chair or inadequate shift forward at times  To improve quad and glute strength and muscular endurance for asymmetrically loaded activities (such as walking on uneven ground) Cuing for strong hip and knee extension. x10 each side. - Side stepping with red theraband wrapped around legs and held at umbilicus to activate core and hip stabilizers and abductors in functional weightbearing position. Cuing for posture. Stepping a few steps out and in as allowed by band 2x10 feet each side. - ball (2kg) tosson compliant surface (airex)with modified tandem stance each side2x20tosses with CGA  for safety to improve knee proprioception.Progressively moving feet further apart towards true tandem stance.  - step ups to 8" step with no BUE support and cuing to fully extend hips and knees. To improve functional hip and knee strength and balance. CGA - min A to prevent falls. Occasional LOB with ability to self correct but not under good control. 2x10 each side. - Education on HEP including handout and yellow theraband.    HOME EXERCISE PROGRAM Access Code: [email or web address]  URL: [email or web address]  Date: 11/05/2018  Prepared by: Rosita Kea   Exercises  Seated Correct Posture  Prone Press Up - 10-15 reps - 1 second hold - 4x daily  Supine Quad Set on Towel Roll - 20 reps - 5 second hold  - 2 Sets - 2x daily - 7x weekly  Supine Bridge - 10-15 reps - 1 second hold - 3 Sets - 1x daily - 3x weekly  Squat with Chair Touch - 10-15 reps - 1 second hold - 3 Sets - 1x daily - 3x weekly  Standing Hamstring Curl with Resistance - 10-15 reps - 1 second hold - 3 Sets - 1x daily - 3x weekly  Standing Hip Abduction with Resistance at Ankles and Counter Support - 10-15 reps - 1 second hold - 3 Sets - 1x daily - 3x weekly    Patient response to treatment:  Pt tolerated treatment well. Pt was able to complete all exercises with minimal to no lasting increase in pain or discomfort. She demonstrates improved balance, strength, and technique with exercises including those progressed in difficulty from last treatment session. She continues to require assistance for safety. HEP was updated to include resistance with hamstring curl and hip abduction.Pt reported appropriate fatigue but no pain at end of session. Pt requires reminders to fully extend hip and knee when performing step ups.Pt required cuing for proper technique and to facilitate improved neuromuscular control, strength, range of motion, and functional ability.     PT Education - 11/05/18 1311    Education provided  Yes    Education Details  Exercise purpose/form. Self management techniques. Education on diagnosis, prognosis, POC, anatomy and physiology of current condition    Person(s) Educated  Patient    Methods  Explanation;Demonstration;Tactile cues;Verbal cues    Comprehension  Verbalized understanding;Returned demonstration       PT Short Term Goals - 10/17/18 1625      PT SHORT TERM GOAL #1   Title  Be independent with initial home exercise program for self-management of symptoms.    Baseline  iniital HEP provided at IE (10/11/2018);     Time  2    Period  Weeks    Status  Achieved    Target Date  10/26/18        PT Long Term Goals - 10/12/18 1249      PT LONG TERM GOAL #1   Title  Be independent with a long-term  home exercise program for self-management of symptoms.     Baseline  iniital HEP provided at IE (10/11/2018);     Time  6    Period  Weeks    Status  New    Target Date  11/23/18      PT LONG TERM GOAL #2   Title  Patient will demonstrate improved ability to perform functional tasks as exhibited by at least 10 point improvement in FOTO score    Baseline  FOTO = 48    Time  6  Period  Weeks    Status  New    Target Date  11/23/18      PT LONG TERM GOAL #3   Title  Patient will demonstrate B LE strength 5/5 to demonstrate functional strength for independent gait, increased standing tolerance, lifting, carrying and increased ADL ability.    Baseline  see objective exam (10/11/2018);     Time  6    Period  Weeks    Status  New    Target Date  11/23/18      PT LONG TERM GOAL #4   Title  Complete community, work and/or recreational activities without limitation due to current condition.     Baseline  limitations in mobility and fitness routine (10/11/2018);    Time  8    Period  Weeks    Status  New    Target Date  11/23/18            Plan - 11/05/18 1351    Clinical Impression Statement  Patient continues to make steady progress overall towards goals at this point. She continues to demonstrate improvements in balance, activity tolerance and functional strength. Today she reports she continues to have some lower leg pain that is not largely affected by exercise at this point and is not restricting her mobility or participation. She may be approaching readiness for discharge in the next week. Patient is a 73 y.o. female referred to outpatient physical therapy with a medical diagnosis of Iliotibial band syndrom, right leg who presents with signs and symptoms consistent with right knee and proximal lower leg pain that appears related to dysfunction at the R knee joint. Patient presents with significant impairments including pain; instability; weakness; decreased activity tolerance;  decreased knowledge of appropriate self-management techniques that are limiting ability to complete basic mobility (such as ambulation over uneven ground, changing direction and twisting while weight bearing) regular fitness program; stepping out of the bathtub; and participation in usual community and social engagement and household responsibilitie without difficulty and fear of falling or inducing pain. Patient will benefit from skilled physical therapy intervention to address current body structure impairments and activity limitations to improve function and work towards goals set in current POC in order to return to prior level of function or maximal functional improvemen    Rehab Potential  Good    PT Frequency  2x / week    PT Duration  6 weeks    PT Treatment/Interventions  ADLs/Self Care Home Management;Cryotherapy;Moist Heat;Stair training;Functional mobility training;Therapeutic activities;Therapeutic exercise;Balance training;Neuromuscular re-education;Patient/family education;Manual techniques;Passive range of motion;Dry needling;Taping;Joint Manipulations;Spinal Manipulations;Other (comment)   joint mobilizations grades I-IV   PT Next Visit Plan  Continue with progressive LE and funcitonal strengthening and dynamic strength training.     PT Home Exercise Plan  Medbridge Access Code: [email or web address]     Consulted and Agree with Plan of Care  Patient       Patient will benefit from skilled therapeutic intervention in order to improve the following deficits and impairments:  Decreased endurance, Decreased mobility, Difficulty walking, Decreased range of motion, Impaired perceived functional ability, Other (comment), Decreased activity tolerance, Decreased strength, Pain, Impaired sensation(decreased knowledge of appropriate self-management techniques)  Visit Diagnosis: Acute pain of right knee  Pain in left hip  Chronic low back pain, unspecified back pain laterality, unspecified whether  sciatica present  Difficulty in walking, not elsewhere classified  Muscle weakness (generalized)     Problem List Patient Active Problem List   Diagnosis Date Noted  .  Hip pain, chronic, left 09/29/2018  . Bone pain of lower leg 09/29/2018  . Pain of right tibia 09/29/2018  . Hyperlipidemia associated with type 2 diabetes mellitus ([place]) 03/29/2018  . Bilateral pes planus 03/29/2018  . SUI (stress urinary incontinence, female) 11/23/2017  . Vaginal atrophy 11/23/2017  . Obesity (BMI 30.0-34.9) 06/20/2017  . Carpal tunnel syndrome on right 06/20/2017  . Endometrioid adenocarcinoma of uterus ([place]) 01/19/2017  . OSA on CPAP 11/24/2016  . Cyst of ovary 11/16/2016  . Cervical stenosis (uterine cervix) 11/16/2016  . Cyst, vulva 10/11/2015  . CKD (chronic kidney disease) stage 3, GFR 30-59 ml/min ([facility]) 11/07/2014  . GERD (gastroesophageal reflux disease) 12/26/2013  . Irritable bowel syndrome with constipation 12/26/2013  . Encounter for preventive health examination 10/21/2013  . Hyperlipidemia LDL goal <100 07/15/2013  . Chronic venous insufficiency 03/21/2013  . DM (diabetes mellitus), type 2 with renal complications ([place]) 49/82/6415  . Lack of libido 11/05/2012  . Barrett's esophagus 11/05/2012  . Hiatal hernia 11/05/2012  . Essential hypertension 11/05/2012    Nancy Nordmann, PT, DPT 11/05/2018, 1:52 PM  [facility] [address], [place], [address] Phone: [phone]   Fax:  [phone]  Name: SIERRA SPARGO MRN: [id number] Date of Birth: [birth year]

## 2018-11-08 ENCOUNTER — Ambulatory Visit: Payer: Medicare Other | Admitting: Physical Therapy

## 2018-11-08 ENCOUNTER — Encounter: Payer: Self-pay | Admitting: Physical Therapy

## 2018-11-08 DIAGNOSIS — G8929 Other chronic pain: Secondary | ICD-10-CM

## 2018-11-08 DIAGNOSIS — M545 Low back pain, unspecified: Secondary | ICD-10-CM

## 2018-11-08 DIAGNOSIS — M25552 Pain in left hip: Secondary | ICD-10-CM

## 2018-11-08 DIAGNOSIS — M25561 Pain in right knee: Secondary | ICD-10-CM

## 2018-11-08 DIAGNOSIS — R262 Difficulty in walking, not elsewhere classified: Secondary | ICD-10-CM

## 2018-11-08 DIAGNOSIS — M6281 Muscle weakness (generalized): Secondary | ICD-10-CM

## 2018-11-08 IMAGING — CR DG HIP (WITH OR WITHOUT PELVIS) 2-3V*L*
3 series · 3 of 3 positions shown · non-contrast
Comparison: Abdomen radiographs from 02/24/2015

CLINICAL DATA: Left hip pain onset last evening without known
injury. Worse with standing and moving.

EXAM:
DG HIP (WITH OR WITHOUT PELVIS) 2-3V LEFT

[pelvis ap]
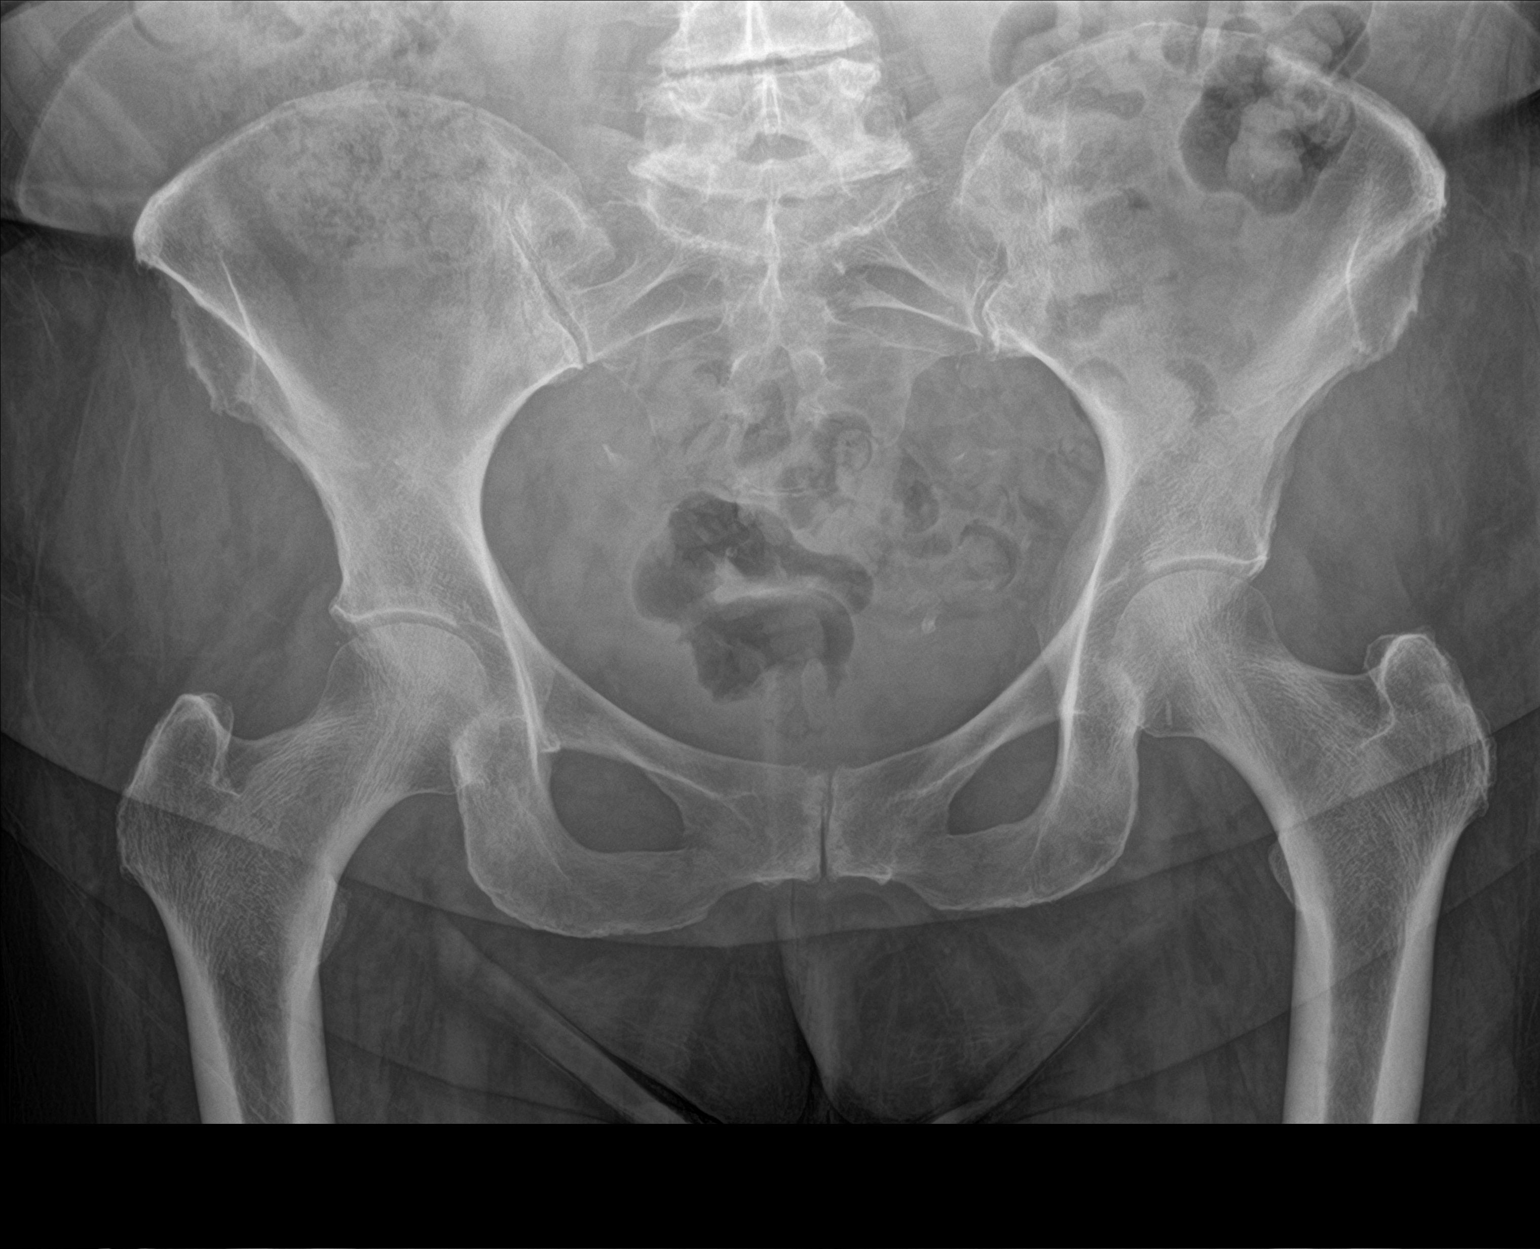

[hip ap]
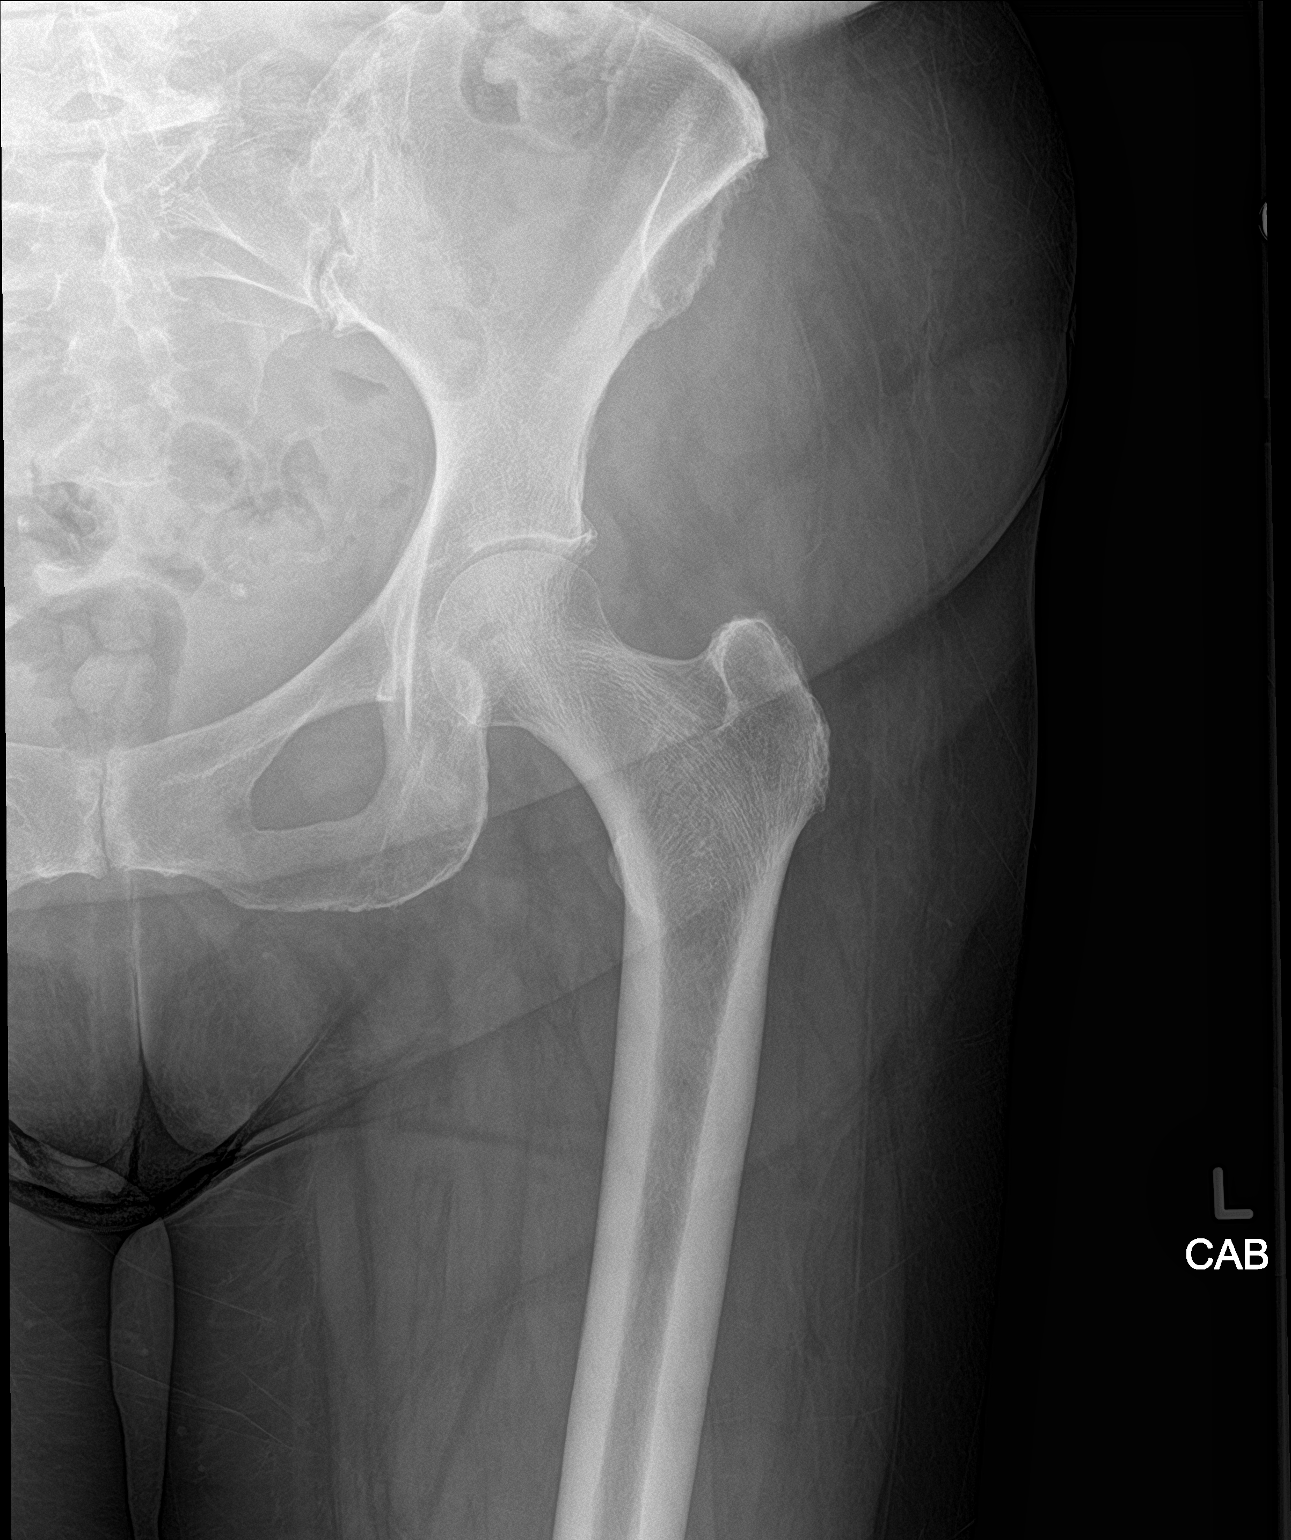

[hip lat]
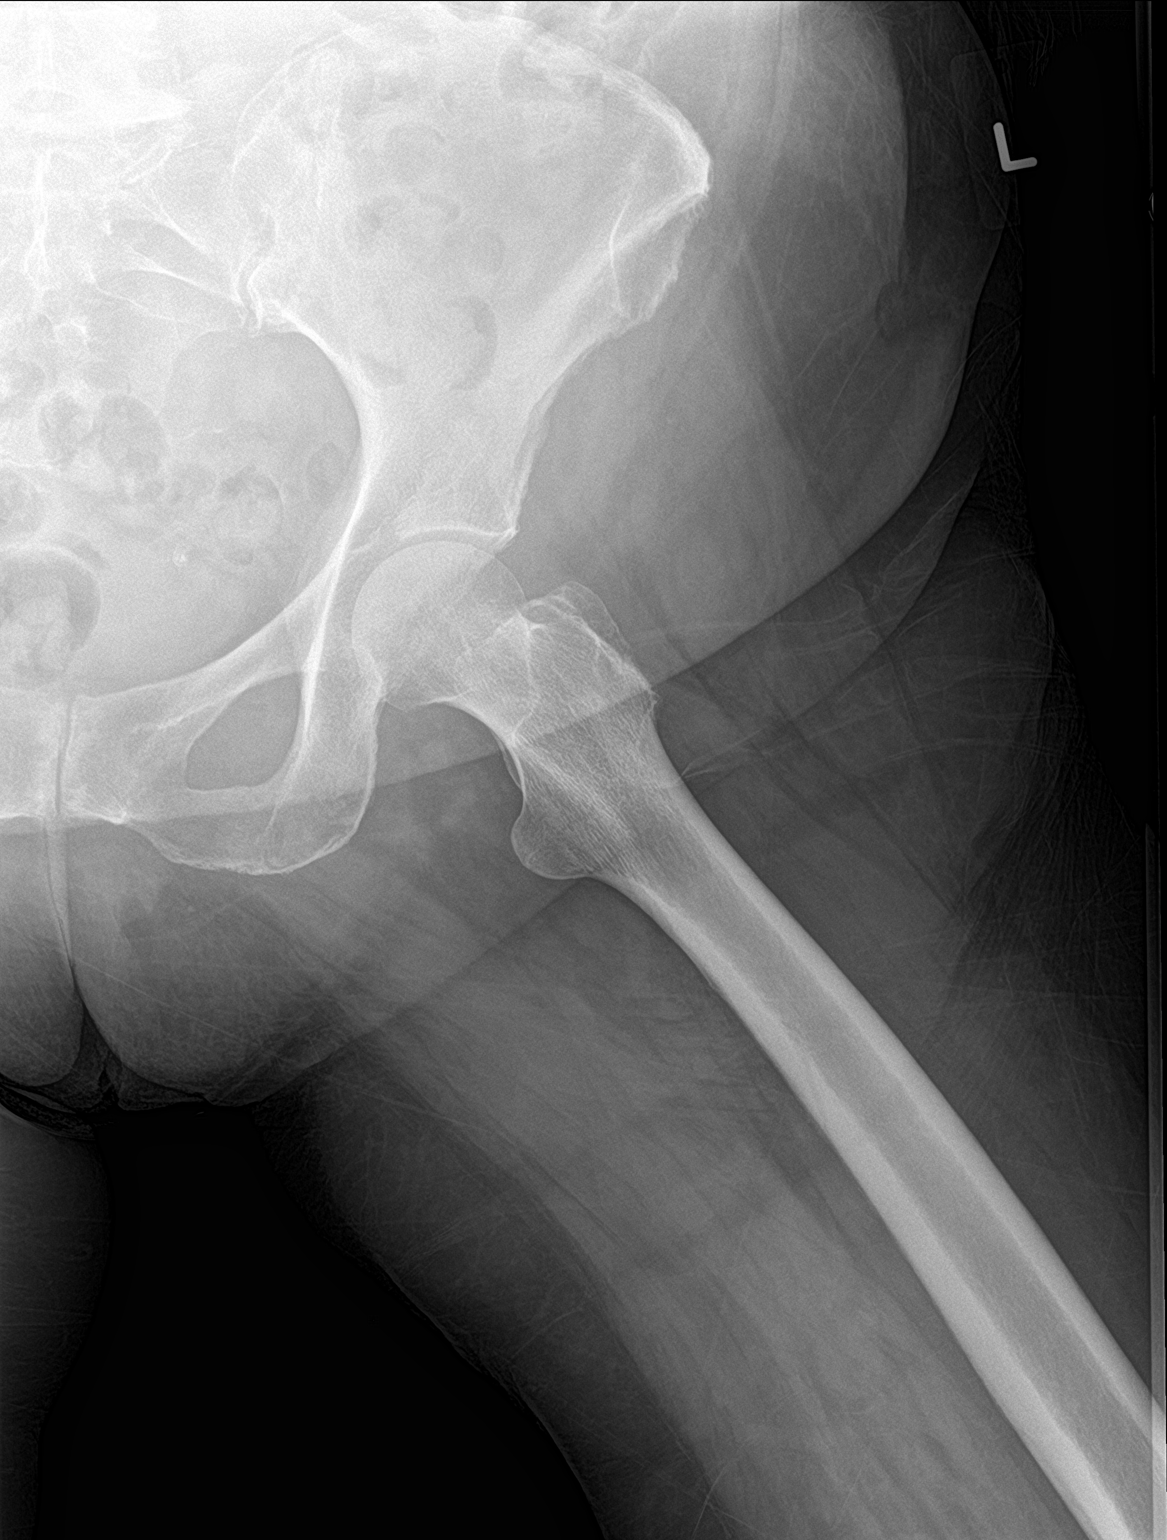

[3 of 3 positions shown; findings below may reference images not displayed]

FINDINGS: There is no evidence of hip fracture or dislocation. There is
degenerative disc disease of the included lower lumbar spine from L4
through S1. Mild degenerative sclerosis and joint space narrowing of
the pubic symphysis with subchondral cystic change. The sacroiliac
joints are maintained without abnormal fusion or erosion. The
arcuate lines of the sacrum appear intact. No bony pelvic fracture
is identified. Slight joint space narrowing of both hips with
subchondral degenerative cystic change of the right acetabulum. No
hip fracture or dislocations. No intra-articular loose bodies. No
frank bone destruction. Vascular calcifications are seen within the
pelvis. Soft tissues are unremarkable.
IMPRESSION: 1. Degenerative changes are noted of the lower included lumbar spine
from L4 caudad.
2. Mild degenerative joint space narrowing of both hips without
acute fracture or dislocations.
3. Osteoarthritis of the pubic symphysis.

## 2018-11-08 NOTE — Therapy (Signed)
[facility] [address], [place], [address] Phone: [phone]   Fax:  [phone]  Physical Therapy Treatment  Patient Details  Name: Sandra Hendrix MRN: [id number] Date of Birth: 1946/04/19 Referring Provider (PT): Lajean Manes, [place]   Encounter Date: 11/08/2018  PT End of Session - 11/08/18 1521    Visit Number  8    Number of Visits  12    Date for PT Re-Evaluation  11/23/18    Authorization Type  [employer] reporting period from 10/11/2018    Authorization Time Period  Current cert period: 11/21/3333 - 11/23/2018 (latest PN: IE 10/11/2018)    Authorization - Visit Number  8    Authorization - Number of Visits  10    PT Start Time  4562    PT Stop Time  1500    PT Time Calculation (min)  38 min    Activity Tolerance  Patient tolerated treatment well;No increased pain    Behavior During Therapy  WFL for tasks assessed/performed       Past Medical History:  Diagnosis Date  . Allergy   . Anxiety   . Arthritis   . Barrett's esophagus determined by endoscopy   . Cancer [facility])    Uterine Cancer  . Chronic kidney disease   . Depression   . Diabetes mellitus without complication ([place])   . Difficult intubation    "not sure what happened,asleep was told had problem with intubation"  . Diverticulosis   . GERD (gastroesophageal reflux disease)   . History of bronchitis   . History of hiatal hernia   . Hyperlipidemia   . Hypertension   . Sleep apnea    Use C- PAP  . Urinary incontinence   . Varicose veins     Past Surgical History:  Procedure Laterality Date  . ABDOMINAL HYSTERECTOMY  03/01/2017  . CHOLECYSTECTOMY    . COLONOSCOPY WITH PROPOFOL N/A 06/14/2016   Procedure: COLONOSCOPY WITH PROPOFOL;  Surgeon: Lollie Sails, MD;  Location: [facility] ENDOSCOPY;  Service: Endoscopy;  Laterality: N/A;  . ESOPHAGOGASTRODUODENOSCOPY (EGD) WITH PROPOFOL N/A 06/14/2016   Procedure:  ESOPHAGOGASTRODUODENOSCOPY (EGD) WITH PROPOFOL;  Surgeon: Lollie Sails, MD;  Location: [facility] ENDOSCOPY;  Service: Endoscopy;  Laterality: N/A;  . ESOPHAGOGASTRODUODENOSCOPY (EGD) WITH PROPOFOL N/A 06/12/2017   Procedure: ESOPHAGOGASTRODUODENOSCOPY (EGD) WITH PROPOFOL;  Surgeon: Lollie Sails, MD;  Location: [facility] ENDOSCOPY;  Service: Endoscopy;  Laterality: N/A;  . ESOPHAGOGASTRODUODENOSCOPY (EGD) WITH PROPOFOL N/A 08/25/2017   Procedure: ESOPHAGOGASTRODUODENOSCOPY (EGD) WITH PROPOFOL;  Surgeon: Lollie Sails, MD;  Location: [facility] ENDOSCOPY;  Service: Endoscopy;  Laterality: N/A;  . HYSTEROSCOPY W/D&C N/A 01/02/2017   Procedure: DILATATION AND CURETTAGE /HYSTEROSCOPY;  Surgeon: Brayton Mars, MD;  Location: [facility] ORS;  Service: Gynecology;  Laterality: N/A;  . JOINT REPLACEMENT Right 2007   Total Knee Replacement  . JOINT REPLACEMENT Left 2004   Total Knee Replacement  . LAPAROSCOPIC HYSTERECTOMY Bilateral 03/01/2017   Procedure: HYSTERECTOMY TOTAL LAPAROSCOPIC BSO;  Surgeon: Mellody Drown, MD;  Location: [facility] ORS;  Service: Gynecology;  Laterality: Bilateral;  . ROTATOR CUFF REPAIR Right   . SENTINEL NODE BIOPSY N/A 03/01/2017   Procedure: SENTINEL NODE INJECTION AND BIOPSY;  Surgeon: Mellody Drown, MD;  Location: [facility] ORS;  Service: Gynecology;  Laterality: N/A;  . TONSILLECTOMY     as a child  . TUBAL LIGATION    . UPPER GI ENDOSCOPY     x2  There were no vitals filed for this visit.  Subjective Assessment - 11/08/18 1519    Subjective  Patient reports she is feeling well and ready to participate in physical therapy. She reports she had minor soreness at let anterior lower leg following last treatment session but was unconcerned. She is continuing her exercises classes.     Pertinent History  Patient is a 73 y.o. female who presents to outpatient physical therapy with a referral for medical diagnosis Iliotibial band syndrom, right leg. This patient's chief complaints  consist of right knee/leg pain when twisting or weight bearing suddenly on that leg, decreased activity tolerance, leading to the following functional deficits: difficulty with usual activities including descending stairs, exercise classes, walking on uneven ground, dynamic balance and mobility, getting out of the tub.     Limitations  Standing;Walking;House hold activities;Other (comment)   stairs, twisting while standing, changing direction, getting out of tub, any time she must catch herself on right leg   Diagnostic tests  Imaging: radiograph report left hip dated 09/27/2018: "IMPRESSION: There is no acute fracture or dislocation of the left hip. There is mild degenerative joint space loss. Of note is degenerative change of the SI joints and pubic symphysis." radiograph report of right tibia and fibula dated 09/29/2018: "There is no acute or significant chronic abnormality of the prosthetic right knee joint. The native bone of the right tibia fibula reveals no acute abnormality. There are mild degenerative changes about the ankle especially laterally."     Patient Stated Goals  plan to go to [place] this Summer and knows it will be a lot of walking and she wants to be able to do it.     Currently in Pain?  No/denies          TREATMENT: Denies allergies to latex, long term steroid use. Pt has 2# ankle weights at home.  Therapeutic exercise:to centralize symptoms and improve ROM,strength, muscular endurance, and activity tolerancerequired for successful completion of functional activities. Multimodal cuing for proper form.  -Treadmill up to 2.22mph with SBA for safety.For improved lower extremity ROM, muscular endurance, and activity tolerance; and to induce the analgesic effect of aerobic exercise, stimulate joint nutrition, and prepare body structures and systems for following interventions. x56minutesduring subjective exam. - single leg sit <> standto standard chairwith PVC pipe  incontralateral UE touching floor for support.SBA- min A at times for safety. Compensation with leg on back of chair or inadequate shift forward at timesTo improve quad and glute strength and muscular endurance for asymmetrically loaded activities (such as walking on uneven ground) Cuing for strong hip and knee extension. x10 each side. - standing hip abduction with narrow BUE support on single PVC pipe. To improve lateral hip strength. X5# ankle weights. 2x10 each side. Cuing for posture and to keep toes forward. SBA for safety.  - standing hamstring curls with BUE with narrow grip. to improve hamstring strength. 2x10 each side with 5# ankle weights.. Cuing for posture and technique. SBA for safety - heel raises on 20 degree slant board with BUE support to stretch and strengthen gastrocs. Cuing for posture. X 20.  -Side stepping withgreentheraband wrapped around legs and held at umbilicus to activate core and hip stabilizers and abductors in functional weightbearing position. Cuing for posture.Stepping a few steps out and in as allowed by band 2x10 feet each side. - ball(2kg)tosson compliant surface (airex)with modified tandem stance each side2x20tosses with CGA for safety to improve knee proprioception.Progressively moving feet further apart towards true  tandem stance.   HOME EXERCISE PROGRAM Access Code: [email or web address]  URL: [email or web address]  Date: 11/05/2018  Prepared by: Rosita Kea   Exercises   Seated Correct Posture   Prone Press Up - 10-15 reps - 1 second hold - 4x daily   Supine Quad Set on Towel Roll - 20 reps - 5 second hold - 2 Sets - 2x daily - 7x weekly   Supine Bridge - 10-15 reps - 1 second hold - 3 Sets - 1x daily - 3x weekly   Squat with Chair Touch - 10-15 reps - 1 second hold - 3 Sets - 1x daily - 3x weekly   Standing Hamstring Curl with Resistance - 10-15 reps - 1 second hold - 3 Sets - 1x daily - 3x weekly   Standing Hip Abduction with  Resistance at Ankles and Counter Support - 10-15 reps - 1 second hold - 3 Sets - 1x daily - 3x weekly    Patient response to treatment:  Pt tolerated treatment well. Pt was able to complete all exercises with minimal to no lasting increase in pain or discomfort. She demonstrated improved ability to complete single leg sit <> stand with less assistance  She continues to require assistance for safety. .Pt reported appropriate fatiguebut no pain at end of session. Pt requires reminders to fully extend hip and knee when performing step ups.Pt required cuing for proper technique and to facilitate improved neuromuscular control, strength, range of motion, and functional ability.      PT Education - 11/08/18 1521    Education provided  Yes    Education Details  Exercise purpose/form. Self management techniques. Education on diagnosis, prognosis, POC, anatomy and physiology of current condition    Person(s) Educated  Patient    Methods  Explanation;Demonstration;Tactile cues;Verbal cues    Comprehension  Verbalized understanding;Returned demonstration       PT Short Term Goals - 10/17/18 1625      PT SHORT TERM GOAL #1   Title  Be independent with initial home exercise program for self-management of symptoms.    Baseline  iniital HEP provided at IE (10/11/2018);     Time  2    Period  Weeks    Status  Achieved    Target Date  10/26/18        PT Long Term Goals - 10/12/18 1249      PT LONG TERM GOAL #1   Title  Be independent with a long-term home exercise program for self-management of symptoms.     Baseline  iniital HEP provided at IE (10/11/2018);     Time  6    Period  Weeks    Status  New    Target Date  11/23/18      PT LONG TERM GOAL #2   Title  Patient will demonstrate improved ability to perform functional tasks as exhibited by at least 10 point improvement in FOTO score    Baseline  FOTO = 48    Time  6    Period  Weeks    Status  New    Target Date  11/23/18       PT LONG TERM GOAL #3   Title  Patient will demonstrate B LE strength 5/5 to demonstrate functional strength for independent gait, increased standing tolerance, lifting, carrying and increased ADL ability.    Baseline  see objective exam (10/11/2018);     Time  6    Period  Weeks    Status  New    Target Date  11/23/18      PT LONG TERM GOAL #4   Title  Complete community, work and/or recreational activities without limitation due to current condition.     Baseline  limitations in mobility and fitness routine (10/11/2018);    Time  8    Period  Weeks    Status  New    Target Date  11/23/18            Plan - 11/08/18 1523    Clinical Impression Statement  Patient continues to make steady progress overall towards goals at this point. She continues to demonstrate improvements in balance, activity tolerance and functional strength. Today she was able to complete all exercises without any pain. She may be approaching readiness for discharge in the next week. Patient is a 73 y.o. female referred to outpatient physical therapy with a medical diagnosis of Iliotibial band syndrom, right leg who presents with signs and symptoms consistent with right knee and proximal lower leg pain that appears related to dysfunction at the R knee joint. Patient presents with significant impairments including pain; instability; weakness; decreased activity tolerance; decreased knowledge of appropriate self-management techniques that are limiting ability to complete basic mobility (such as ambulation over uneven ground, changing direction and twisting while weight bearing) regular fitness program; stepping out of the bathtub; and participation in usual community and social engagement and household responsibilitie without difficulty and fear of falling or inducing pain. Patient will benefit from skilled physical therapy intervention to address current body structure impairments and activity limitations to improve  function and work towards goals set in current POC in order to return to prior level of function or maximal functional improvemen    Rehab Potential  Good    PT Frequency  2x / week    PT Duration  6 weeks    PT Treatment/Interventions  ADLs/Self Care Home Management;Cryotherapy;Moist Heat;Stair training;Functional mobility training;Therapeutic activities;Therapeutic exercise;Balance training;Neuromuscular re-education;Patient/family education;Manual techniques;Passive range of motion;Dry needling;Taping;Joint Manipulations;Spinal Manipulations;Other (comment)   joint mobilizations grades I-IV   PT Next Visit Plan  Continue with progressive LE and funcitonal strengthening and dynamic strength training.     PT Home Exercise Plan  Medbridge Access Code: [email or web address]     Consulted and Agree with Plan of Care  Patient       Patient will benefit from skilled therapeutic intervention in order to improve the following deficits and impairments:  Decreased endurance, Decreased mobility, Difficulty walking, Decreased range of motion, Impaired perceived functional ability, Other (comment), Decreased activity tolerance, Decreased strength, Pain, Impaired sensation(decreased knowledge of appropriate self-management techniques)  Visit Diagnosis: Acute pain of right knee  Pain in left hip  Chronic low back pain, unspecified back pain laterality, unspecified whether sciatica present  Difficulty in walking, not elsewhere classified  Muscle weakness (generalized)     Problem List Patient Active Problem List   Diagnosis Date Noted  . Hip pain, chronic, left 09/29/2018  . Bone pain of lower leg 09/29/2018  . Pain of right tibia 09/29/2018  . Hyperlipidemia associated with type 2 diabetes mellitus ([place]) 03/29/2018  . Bilateral pes planus 03/29/2018  . SUI (stress urinary incontinence, female) 11/23/2017  . Vaginal atrophy 11/23/2017  . Obesity (BMI 30.0-34.9) 06/20/2017  . Carpal tunnel syndrome on  right 06/20/2017  . Endometrioid adenocarcinoma of uterus ([place]) 01/19/2017  . OSA on CPAP 11/24/2016  . Cyst of ovary 11/16/2016  . Cervical stenosis (uterine cervix) 11/16/2016  . Cyst, vulva 10/11/2015  .  CKD (chronic kidney disease) stage 3, GFR 30-59 ml/min ([facility]) 11/07/2014  . GERD (gastroesophageal reflux disease) 12/26/2013  . Irritable bowel syndrome with constipation 12/26/2013  . Encounter for preventive health examination 10/21/2013  . Hyperlipidemia LDL goal <100 07/15/2013  . Chronic venous insufficiency 03/21/2013  . DM (diabetes mellitus), type 2 with renal complications ([place]) 33/08/5086  . Lack of libido 11/05/2012  . Barrett's esophagus 11/05/2012  . Hiatal hernia 11/05/2012  . Essential hypertension 11/05/2012    Nancy Nordmann, PT, DPT 11/08/2018, 3:24 PM  [facility] [address] [place], [address] Phone: [phone]   Fax:  [phone]  Name: Sandra Hendrix MRN: [id number] Date of Birth: Aug 26, 1946

## 2018-11-13 ENCOUNTER — Ambulatory Visit: Payer: Medicare Other | Admitting: Physical Therapy

## 2018-11-13 ENCOUNTER — Encounter: Payer: Self-pay | Admitting: Physical Therapy

## 2018-11-13 DIAGNOSIS — M25552 Pain in left hip: Secondary | ICD-10-CM

## 2018-11-13 DIAGNOSIS — M545 Low back pain, unspecified: Secondary | ICD-10-CM

## 2018-11-13 DIAGNOSIS — R262 Difficulty in walking, not elsewhere classified: Secondary | ICD-10-CM

## 2018-11-13 DIAGNOSIS — G8929 Other chronic pain: Secondary | ICD-10-CM

## 2018-11-13 DIAGNOSIS — M25561 Pain in right knee: Secondary | ICD-10-CM

## 2018-11-13 DIAGNOSIS — M6281 Muscle weakness (generalized): Secondary | ICD-10-CM

## 2018-11-13 NOTE — Therapy (Signed)
[facility] [address], [place], [address] Phone: [phone]   Fax:  [phone]  Physical Therapy Treatment  Patient Details  Name: Sandra Hendrix MRN: [id number] Date of Birth: Jan 01, 1946 Referring Provider (PT): Lajean Manes, [place]   Encounter Date: 11/13/2018  PT End of Session - 11/13/18 1627    Visit Number  9    Number of Visits  12    Date for PT Re-Evaluation  11/23/18    Authorization Type  [employer] reporting period from 10/11/2018    Authorization Time Period  Current cert period: 05/20/36 - 11/23/2018 (latest PN: IE 10/11/2018)    Authorization - Visit Number  9    Authorization - Number of Visits  10    PT Start Time  1620    PT Stop Time  1700    PT Time Calculation (min)  40 min    Activity Tolerance  Patient tolerated treatment well;No increased pain    Behavior During Therapy  WFL for tasks assessed/performed       Past Medical History:  Diagnosis Date  . Allergy   . Anxiety   . Arthritis   . Barrett's esophagus determined by endoscopy   . Cancer [facility])    Uterine Cancer  . Chronic kidney disease   . Depression   . Diabetes mellitus without complication ([place])   . Difficult intubation    "not sure what happened,asleep was told had problem with intubation"  . Diverticulosis   . GERD (gastroesophageal reflux disease)   . History of bronchitis   . History of hiatal hernia   . Hyperlipidemia   . Hypertension   . Sleep apnea    Use C- PAP  . Urinary incontinence   . Varicose veins     Past Surgical History:  Procedure Laterality Date  . ABDOMINAL HYSTERECTOMY  03/01/2017  . CHOLECYSTECTOMY    . COLONOSCOPY WITH PROPOFOL N/A 06/14/2016   Procedure: COLONOSCOPY WITH PROPOFOL;  Surgeon: Lollie Sails, MD;  Location: [facility] ENDOSCOPY;  Service: Endoscopy;  Laterality: N/A;  . ESOPHAGOGASTRODUODENOSCOPY (EGD) WITH PROPOFOL N/A 06/14/2016   Procedure:  ESOPHAGOGASTRODUODENOSCOPY (EGD) WITH PROPOFOL;  Surgeon: Lollie Sails, MD;  Location[facility] ENDOSCOPY;  Service: Endoscopy;  Laterality: N/A;  . ESOPHAGOGASTRODUODENOSCOPY (EGD) WITH PROPOFOL N/A 06/12/2017   Procedure: ESOPHAGOGASTRODUODENOSCOPY (EGD) WITH PROPOFOL;  Surgeon: Lollie Sails, MD;  Location: [facility] ENDOSCOPY;  Service: Endoscopy;  Laterality: N/A;  . ESOPHAGOGASTRODUODENOSCOPY (EGD) WITH PROPOFOL N/A 08/25/2017   Procedure: ESOPHAGOGASTRODUODENOSCOPY (EGD) WITH PROPOFOL;  Surgeon: Lollie Sails, MD;  Location: [facility] ENDOSCOPY;  Service: Endoscopy;  Laterality: N/A;  . HYSTEROSCOPY W/D&C N/A 01/02/2017   Procedure: DILATATION AND CURETTAGE /HYSTEROSCOPY;  Surgeon: Brayton Mars, MD;  Location: [facility] ORS;  Service: Gynecology;  Laterality: N/A;  . JOINT REPLACEMENT Right 2007   Total Knee Replacement  . JOINT REPLACEMENT Left 2004   Total Knee Replacement  . LAPAROSCOPIC HYSTERECTOMY Bilateral 03/01/2017   Procedure: HYSTERECTOMY TOTAL LAPAROSCOPIC BSO;  Surgeon: Mellody Drown, MD;  Location: [facility] ORS;  Service: Gynecology;  Laterality: Bilateral;  . ROTATOR CUFF REPAIR Right   . SENTINEL NODE BIOPSY N/A 03/01/2017   Procedure: SENTINEL NODE INJECTION AND BIOPSY;  Surgeon: Mellody Drown, MD;  Location: [facility] ORS;  Service: Gynecology;  Laterality: N/A;  . TONSILLECTOMY     as a child  . TUBAL LIGATION    . UPPER GI ENDOSCOPY     x2  There were no vitals filed for this visit.  Subjective Assessment - 11/13/18 1625    Subjective  Patient reports she is feeling well today. She reports no pain. She did not go to any workout classes today but did some weights and walking at home. She reports she had no excessive soreness or pain following last treatment session. She no longer experiences the shooting pains that were bothering her when she started PT.     Pertinent History  Patient is a 73 y.o. female who presents to outpatient physical therapy with a referral for  medical diagnosis Iliotibial band syndrom, right leg. This patient's chief complaints consist of right knee/leg pain when twisting or weight bearing suddenly on that leg, decreased activity tolerance, leading to the following functional deficits: difficulty with usual activities including descending stairs, exercise classes, walking on uneven ground, dynamic balance and mobility, getting out of the tub.     Limitations  Standing;Walking;House hold activities;Other (comment)   stairs, twisting while standing, changing direction, getting out of tub, any time she must catch herself on right leg   Diagnostic tests  Imaging: radiograph report left hip dated 09/27/2018: "IMPRESSION: There is no acute fracture or dislocation of the left hip. There is mild degenerative joint space loss. Of note is degenerative change of the SI joints and pubic symphysis." radiograph report of right tibia and fibula dated 09/29/2018: "There is no acute or significant chronic abnormality of the prosthetic right knee joint. The native bone of the right tibia fibula reveals no acute abnormality. There are mild degenerative changes about the ankle especially laterally."     Patient Stated Goals  plan to go to [place] this Summer and knows it will be a lot of walking and she wants to be able to do it.     Currently in Pain?  No/denies       TREATMENT: Denies allergies to latex, long term steroid use. Pt has 2# ankle weights at home.  Therapeutic exercise:to centralize symptoms and improve ROM,strength, muscular endurance, and activity tolerancerequired for successful completion of functional activities. Multimodal cuing for proper form.  -Treadmill up to2.78mph with SBA for safety.For improved lower extremity ROM, muscular endurance, and activity tolerance; and to induce the analgesic effect of aerobic exercise, stimulate joint nutrition, and prepare body structures and systems for following interventions. x66minutesduring  subjective exam. - single leg sit <> standto standard chairwith PVC pipe incontralateral UE touching floor for support.SBA- min A at times for safety. Compensation with leg on back of chair or inadequate shift forward at timesTo improve quad and glute strength and muscular endurance for asymmetrically loaded activities (such as walking on uneven ground) Cuing for strong hip and knee extension. x10 each side. - standing hip abduction with narrow BUE support on single PVC pipe.To improve lateral hip strength. X5# ankle weights. 2x10 each side. Cuing for posture and to keep toes forward.SBA for safety.  - standing hamstring curls with BUEwith narrow grip.to improve hamstring strength. 2x10 each side with 5# ankle weights..Cuing for posture and technique. SBA for safety - heel raises on 20 degree slant board with BUE support to stretch and strengthen gastrocs. Cuing for posture. 2X 20. -Side stepping withgreentheraband wrapped around legs and held at umbilicus to activate core and hip stabilizers and abductors in functional weightbearing position. Cuing for posture.Stepping a few steps out and in as allowed by band 2x75min back and forth along 10 foot stretch.  - ball(2kg)tosson compliant surface (airex)with modified tandem stance each side2x20tosses  with CGA for safety to improve knee proprioception.Progressively moving feet further apart towards true tandem stance.  HOME EXERCISE PROGRAM Access Code: [email or web address]  URL: [email or web address]  Date: 11/05/2018  Prepared by: Rosita Kea   Exercises   Seated Correct Posture   Prone Press Up - 10-15 reps - 1 second hold - 4x daily   Supine Quad Set on Towel Roll - 20 reps - 5 second hold - 2 Sets - 2x daily - 7x weekly   Supine Bridge - 10-15 reps - 1 second hold - 3 Sets - 1x daily - 3x weekly   Squat with Chair Touch - 10-15 reps - 1 second hold - 3 Sets - 1x daily - 3x weekly   Standing Hamstring Curl with  Resistance - 10-15 reps - 1 second hold - 3 Sets - 1x daily - 3x weekly   Standing Hip Abduction with Resistance at Ankles and Counter Support - 10-15 reps - 1 second hold - 3 Sets - 1x daily - 3x weekly   Patient response to treatment:  Pt tolerated treatment well. Pt was able to complete all exercises with no reports of paint. She continues to demonstrate improved ability to complete single leg sit <> stand with less assistance and loss of balance.She continues to require assistance for safety but it is gradually lessening.Pt reported appropriate fatiguebut no pain at end of session. Pt required cuing for proper technique and to facilitate improved neuromuscular control, strength, range of motion, and functional ability.    PT Education - 11/13/18 1626    Education provided  Yes    Education Details  Exercise purpose/form. Self management techniques. Education on diagnosis, prognosis, POC, anatomy and physiology of current condition    Person(s) Educated  Patient    Methods  Explanation;Demonstration;Tactile cues;Verbal cues    Comprehension  Verbalized understanding;Returned demonstration       PT Short Term Goals - 10/17/18 1625      PT SHORT TERM GOAL #1   Title  Be independent with initial home exercise program for self-management of symptoms.    Baseline  iniital HEP provided at IE (10/11/2018);     Time  2    Period  Weeks    Status  Achieved    Target Date  10/26/18        PT Long Term Goals - 10/12/18 1249      PT LONG TERM GOAL #1   Title  Be independent with a long-term home exercise program for self-management of symptoms.     Baseline  iniital HEP provided at IE (10/11/2018);     Time  6    Period  Weeks    Status  New    Target Date  11/23/18      PT LONG TERM GOAL #2   Title  Patient will demonstrate improved ability to perform functional tasks as exhibited by at least 10 point improvement in FOTO score    Baseline  FOTO = 48    Time  6    Period   Weeks    Status  New    Target Date  11/23/18      PT LONG TERM GOAL #3   Title  Patient will demonstrate B LE strength 5/5 to demonstrate functional strength for independent gait, increased standing tolerance, lifting, carrying and increased ADL ability.    Baseline  see objective exam (10/11/2018);     Time  6    Period  Weeks  Status  New    Target Date  11/23/18      PT LONG TERM GOAL #4   Title  Complete community, work and/or recreational activities without limitation due to current condition.     Baseline  limitations in mobility and fitness routine (10/11/2018);    Time  8    Period  Weeks    Status  New    Target Date  11/23/18            Plan - 11/13/18 1833    Clinical Impression Statement  Patient continues to make steady progress overall towards goals at this point. She continues to demonstrate improvements in balance, activity tolerance and functional strength. Today she was able to complete all exercises without any pain and the plan is to discharge next visit if patient has not experienced any significant setbacks. Patient is a 73 y.o. female referred to outpatient physical therapy with a medical diagnosis of Iliotibial band syndrom, right leg who presents with signs and symptoms consistent with right knee and proximal lower leg pain that appears related to dysfunction at the R knee joint. Patient presents with significant impairments including pain; instability; weakness; decreased activity tolerance; decreased knowledge of appropriate self-management techniques that are limiting ability to complete basic mobility (such as ambulation over uneven ground, changing direction and twisting while weight bearing) regular fitness program; stepping out of the bathtub; and participation in usual community and social engagement and household responsibilitie without difficulty and fear of falling or inducing pain. Patient will benefit from skilled physical therapy intervention to  address current body structure impairments and activity limitations to improve function and work towards goals set in current POC in order to return to prior level of function or maximal functional improvemen    Rehab Potential  Good    PT Frequency  2x / week    PT Duration  6 weeks    PT Treatment/Interventions  ADLs/Self Care Home Management;Cryotherapy;Moist Heat;Stair training;Functional mobility training;Therapeutic activities;Therapeutic exercise;Balance training;Neuromuscular re-education;Patient/family education;Manual techniques;Passive range of motion;Dry needling;Taping;Joint Manipulations;Spinal Manipulations;Other (comment)   joint mobilizations grades I-IV   PT Next Visit Plan  Consider discharge next treatment session.     PT Home Exercise Plan  Medbridge Access Code: [email or web address]     Consulted and Agree with Plan of Care  Patient       Patient will benefit from skilled therapeutic intervention in order to improve the following deficits and impairments:  Decreased endurance, Decreased mobility, Difficulty walking, Decreased range of motion, Impaired perceived functional ability, Other (comment), Decreased activity tolerance, Decreased strength, Pain, Impaired sensation(decreased knowledge of appropriate self-management techniques)  Visit Diagnosis: Acute pain of right knee  Pain in left hip  Chronic low back pain, unspecified back pain laterality, unspecified whether sciatica present  Difficulty in walking, not elsewhere classified  Muscle weakness (generalized)     Problem List Patient Active Problem List   Diagnosis Date Noted  . Hip pain, chronic, left 09/29/2018  . Bone pain of lower leg 09/29/2018  . Pain of right tibia 09/29/2018  . Hyperlipidemia associated with type 2 diabetes mellitus ([place]) 03/29/2018  . Bilateral pes planus 03/29/2018  . SUI (stress urinary incontinence, female) 11/23/2017  . Vaginal atrophy 11/23/2017  . Obesity (BMI 30.0-34.9) 06/20/2017   . Carpal tunnel syndrome on right 06/20/2017  . Endometrioid adenocarcinoma of uterus ([place]) 01/19/2017  . OSA on CPAP 11/24/2016  . Cyst of ovary 11/16/2016  . Cervical stenosis (uterine cervix) 11/16/2016  . Cyst, vulva 10/11/2015  .  CKD (chronic kidney disease) stage 3, GFR 30-59 ml/min ([facility]) 11/07/2014  . GERD (gastroesophageal reflux disease) 12/26/2013  . Irritable bowel syndrome with constipation 12/26/2013  . Encounter for preventive health examination 10/21/2013  . Hyperlipidemia LDL goal <100 07/15/2013  . Chronic venous insufficiency 03/21/2013  . DM (diabetes mellitus), type 2 with renal complications ([place]) 58/30/9407  . Lack of libido 11/05/2012  . Barrett's esophagus 11/05/2012  . Hiatal hernia 11/05/2012  . Essential hypertension 11/05/2012    Nancy Nordmann, PT, DPT 11/13/2018, 6:34 PM  [facility] [address] [place], [address] Phone: [phone]   Fax:  [phone]  Name: Sandra Hendrix MRN: [id number] Date of Birth: [birth year]

## 2018-11-19 ENCOUNTER — Telehealth: Payer: Self-pay | Admitting: Physical Therapy

## 2018-11-19 ENCOUNTER — Ambulatory Visit: Payer: Medicare Other | Admitting: Physical Therapy

## 2018-11-19 NOTE — Telephone Encounter (Signed)
Called pt today after she did not show up for her 4pm appt. Pt answered and said she thought her appointment was tomorrow. We re-scheduled her appointment from today to tomorrow at 3:30pm for her convenience and she agreed she could come. Notified front office and requested the reschedule.

## 2018-11-20 ENCOUNTER — Ambulatory Visit: Payer: Medicare Other | Admitting: Physical Therapy

## 2018-11-20 ENCOUNTER — Encounter: Payer: Self-pay | Admitting: Physical Therapy

## 2018-11-20 DIAGNOSIS — M545 Low back pain, unspecified: Secondary | ICD-10-CM

## 2018-11-20 DIAGNOSIS — R262 Difficulty in walking, not elsewhere classified: Secondary | ICD-10-CM

## 2018-11-20 DIAGNOSIS — M6281 Muscle weakness (generalized): Secondary | ICD-10-CM

## 2018-11-20 DIAGNOSIS — G8929 Other chronic pain: Secondary | ICD-10-CM

## 2018-11-20 DIAGNOSIS — M25561 Pain in right knee: Secondary | ICD-10-CM | POA: Diagnosis not present

## 2018-11-20 DIAGNOSIS — M25552 Pain in left hip: Secondary | ICD-10-CM

## 2018-11-20 NOTE — Therapy (Signed)
[facility] [address] [place], [address] Phone: [phone]   Fax:  [phone]  Physical Therapy Discharge Summary / Treatment Reporting period: 10/11/2018 - 11/20/2018  Patient Details  Name: Sandra Hendrix MRN: [id number] Date of Birth: Jun 03, 1946 Referring Provider (PT): Lajean Manes, [place]   Encounter Date: 11/20/2018  PT End of Session - 11/20/18 1551    Visit Number  10    Number of Visits  12    Date for PT Re-Evaluation  11/23/18    Authorization Type  [employer] reporting period from 10/11/2018    Authorization Time Period  Current cert period: 6/38/4665 - 11/23/2018 (latest PN: IE 10/11/2018)    Authorization - Visit Number  10    Authorization - Number of Visits  10    PT Start Time  9935    PT Stop Time  1615    PT Time Calculation (min)  30 min    Activity Tolerance  Patient tolerated treatment well;No increased pain    Behavior During Therapy  WFL for tasks assessed/performed       Past Medical History:  Diagnosis Date  . Allergy   . Anxiety   . Arthritis   . Barrett's esophagus determined by endoscopy   . Cancer [facility])    Uterine Cancer  . Chronic kidney disease   . Depression   . Diabetes mellitus without complication ([place])   . Difficult intubation    "not sure what happened,asleep was told had problem with intubation"  . Diverticulosis   . GERD (gastroesophageal reflux disease)   . History of bronchitis   . History of hiatal hernia   . Hyperlipidemia   . Hypertension   . Sleep apnea    Use C- PAP  . Urinary incontinence   . Varicose veins     Past Surgical History:  Procedure Laterality Date  . ABDOMINAL HYSTERECTOMY  03/01/2017  . CHOLECYSTECTOMY    . COLONOSCOPY WITH PROPOFOL N/A 06/14/2016   Procedure: COLONOSCOPY WITH PROPOFOL;  Surgeon: Lollie Sails, MD;  Location: [facility] ENDOSCOPY;  Service: Endoscopy;  Laterality: N/A;  .  ESOPHAGOGASTRODUODENOSCOPY (EGD) WITH PROPOFOL N/A 06/14/2016   Procedure: ESOPHAGOGASTRODUODENOSCOPY (EGD) WITH PROPOFOL;  Surgeon: Lollie Sails, MD;  Location: [facility] ENDOSCOPY;  Service: Endoscopy;  Laterality: N/A;  . ESOPHAGOGASTRODUODENOSCOPY (EGD) WITH PROPOFOL N/A 06/12/2017   Procedure: ESOPHAGOGASTRODUODENOSCOPY (EGD) WITH PROPOFOL;  Surgeon: Lollie Sails, MD;  Location: [facility] ENDOSCOPY;  Service: Endoscopy;  Laterality: N/A;  . ESOPHAGOGASTRODUODENOSCOPY (EGD) WITH PROPOFOL N/A 08/25/2017   Procedure: ESOPHAGOGASTRODUODENOSCOPY (EGD) WITH PROPOFOL;  Surgeon: Lollie Sails, MD;  Location: [facility] ENDOSCOPY;  Service: Endoscopy;  Laterality: N/A;  . HYSTEROSCOPY W/D&C N/A 01/02/2017   Procedure: DILATATION AND CURETTAGE /HYSTEROSCOPY;  Surgeon: Brayton Mars, MD;  Location: [facility] ORS;  Service: Gynecology;  Laterality: N/A;  . JOINT REPLACEMENT Right 2007   Total Knee Replacement  . JOINT REPLACEMENT Left 2004   Total Knee Replacement  . LAPAROSCOPIC HYSTERECTOMY Bilateral 03/01/2017   Procedure: HYSTERECTOMY TOTAL LAPAROSCOPIC BSO;  Surgeon: Mellody Drown, MD;  Location: [facility] ORS;  Service: Gynecology;  Laterality: Bilateral;  . ROTATOR CUFF REPAIR Right   . SENTINEL NODE BIOPSY N/A 03/01/2017   Procedure: SENTINEL NODE INJECTION AND BIOPSY;  Surgeon: Mellody Drown, MD;  Location: [facility] ORS;  Service: Gynecology;  Laterality: N/A;  . TONSILLECTOMY     as a child  . TUBAL LIGATION    . UPPER GI  ENDOSCOPY     x2    There were no vitals filed for this visit.  Subjective Assessment - 11/20/18 1547    Subjective  Patient reports she is feeling well today. She continues to have some tenderness in the anterior proximal right tibia that occurs occasionally, but she no longer has the shooting pains that she used to. She states she feels much better and is comfortable with today being her last day of formal treatment. she brought in her balance disc to test out.     Pertinent  History  Patient is a 73 y.o. female who presents to outpatient physical therapy with a referral for medical diagnosis Iliotibial band syndrom, right leg. This patient's chief complaints consist of right knee/leg pain when twisting or weight bearing suddenly on that leg, decreased activity tolerance, leading to the following functional deficits: difficulty with usual activities including descending stairs, exercise classes, walking on uneven ground, dynamic balance and mobility, getting out of the tub.     Limitations  Standing;Walking;House hold activities;Other (comment)   stairs, twisting while standing, changing direction, getting out of tub, any time she must catch herself on right leg   Diagnostic tests  Imaging: radiograph report left hip dated 09/27/2018: "IMPRESSION: There is no acute fracture or dislocation of the left hip. There is mild degenerative joint space loss. Of note is degenerative change of the SI joints and pubic symphysis." radiograph report of right tibia and fibula dated 09/29/2018: "There is no acute or significant chronic abnormality of the prosthetic right knee joint. The native bone of the right tibia fibula reveals no acute abnormality. There are mild degenerative changes about the ankle especially laterally."     Patient Stated Goals  plan to go to [place] this Summer and knows it will be a lot of walking and she wants to be able to do it.     Currently in Pain?  Yes    Pain Score  1     Pain Location  Leg    Pain Orientation  Right    Pain Descriptors / Indicators  Sore;Aching    Pain Type  Chronic pain    Aggravating Factors   no longer having pain when she has to "catch herslef"    Pain Relieving Factors  walking    Effect of Pain on Daily Activities  no longer worried about trusting her knee.           [facility] PT Assessment - 11/20/18 0001      Assessment   Medical Diagnosis  Iliotibial band syndrom, right leg    Referring Provider (PT)  Jarvis Morgan, Sueanne Margarita, PA-C     Onset Date/Surgical Date  08/11/18    Prior Therapy  none for this problem      [place] residence    Living Arrangements  Spouse/significant other    [place] to enter    Entrance Stairs-Number of Steps  6    Entrance Stairs-Rails  Right;Left;Can reach both    Additional Comments  Walk in shower and tub-shower combo with handrail      Prior Function   Level of Independence  Independent    Vocation  Retired    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     Leisure  Workout, clean houses, with friends and grandkids, likes to draw.       Cognition   Overall Cognitive Status  Within Functional  Limits for tasks assessed      Observation/Other Assessments   Observations  see note from 11/20/2018    Focus on Therapeutic Outcomes (FOTO)   99      OBJECTIVE: OBSERVATION/INSPECTION: Patient presents with evidence of mature scarring and healing from bilateral TKA. No abnormal heat or redness noted.  STRENGTH:  *Indicates pain Hip         Flexion: R = 5/5, L = 5/5.  Extension: R = 5/5, L = 5/5.  Abduction: R = 5/5, L = 5/5. Knee  Ext: R = 5/5, L = 5/5.  Flex: R = 5/5, L = 5/5. Ankle (seated position)  B = WFL.  FUNCTIONAL MOBILITY:  Bed mobility: sit <> supine and rolling I  Transfers: sit <> stand I  Gait: ambulates community and household distances I  Stairs: ascends and descends step over step with BUE support I   Objective measurements completed on examination: See above findings.  TREATMENT: Denies allergies to latex, long term steroid use. Pt has 2# ankle weights at home.  Therapeutic exercise:to centralize symptoms and improve ROM,strength, muscular endurance, and activity tolerancerequired for successful completion of functional activities. Multimodal cuing for proper form.  -Treadmill up to2.22mh with SBA for safety.For improved lower extremity ROM, muscular endurance,  and activity tolerance; and to induce the analgesic effect of aerobic exercise, stimulate joint nutrition, and prepare body structures and systems for following interventions. [id number] subjective exam. - single leg sit <>standto standard chairwith PVC pipe incontralateral UE touching floor for support.SBA- min A at times for safety. Compensation with leg on back of chair or inadequate shift forward at timesTo improve quad and glute strength and muscular endurance for asymmetrically loaded activities (such as walking on uneven ground) Cuing for strong hip and knee extension. x10 each side. - review of activities that can be safely performed on balance disc.  - Education on HEP including handout   HOME EXERCISE PROGRAM Access Code: [email or web address]  URL: [email or web address]  Date: 11/20/2018  Prepared by: SaRosita Kea Exercises  Seated Correct Posture  Prone Press Up - 10-15 reps - 1 second hold - 4x daily  Supine Quad Set on Towel Roll - 20 reps - 5 second hold - 2 Sets - 2x daily - 7x weekly  Supine Bridge - 10-15 reps - 1 second hold - 3 Sets - 1x daily - 3x weekly  Squat with Chair Touch - 10-15 reps - 1 second hold - 3 Sets - 1x daily - 3x weekly  Single Leg Sit to Stand with Arms Extended - 10-15 reps - 1 second hold - 3 Sets  Standing Hamstring Curl with Resistance - 10-15 reps - 1 second hold - 3 Sets - 1x daily - 3x weekly  Standing Hip Abduction with Resistance at Ankles and Counter Support - 10-15 reps - 1 second hold - 3 Sets - 1x daily - 3x weekly     PT Education - 11/20/18 1551    Education provided  Yes    Education Details  Exercise purpose/form. Self management techniques. Education on diagnosis, prognosis, POC, anatomy and physiology of current condition    Person(s) Educated  Patient    Methods  Explanation;Demonstration;Tactile cues;Verbal cues    Comprehension  Verbalized understanding;Returned demonstration       PT Short Term Goals -  10/17/18 1625      PT SHORT TERM GOAL #1   Title  Be independent with initial home exercise program for self-management of symptoms.  Baseline  iniital HEP provided at IE (10/11/2018);     Time  2    Period  Weeks    Status  Achieved    Target Date  10/26/18        PT Long Term Goals - 11/20/18 1722      PT LONG TERM GOAL #1   Title  Be independent with a long-term home exercise program for self-management of symptoms.     Baseline  iniital HEP provided at IE (10/11/2018);     Time  6    Period  Weeks    Status  Achieved    Target Date  11/23/18      PT LONG TERM GOAL #2   Title  Patient will demonstrate improved ability to perform functional tasks as exhibited by at least 10 point improvement in FOTO score    Baseline  FOTO = 48 (IE); Foto = 99 (11/20/2018);     Time  6    Period  Weeks    Status  Achieved    Target Date  11/23/18      PT LONG TERM GOAL #3   Title  Patient will demonstrate B LE strength 5/5 to demonstrate functional strength for independent gait, increased standing tolerance, lifting, carrying and increased ADL ability.    Baseline  see objective exam (10/11/2018); see objective (11/20/2018);     Time  6    Period  Weeks    Status  Achieved    Target Date  11/23/18      PT LONG TERM GOAL #4   Title  Complete community, work and/or recreational activities without limitation due to current condition.     Baseline  limitations in mobility and fitness routine (10/11/2018); has returned to mobility and fitness routine without limitation (11/20/2018);     Time  8    Period  Weeks    Status  Achieved    Target Date  11/23/18            Plan - 11/20/18 1721    Clinical Impression Statement  Patient has attended 10 physical therapy treatment sessions this episode of care and has met all goals at this time. She steadily progressed towards goals and no longer is hindered in her daily life by her knee pain. She sometimes has some lingering soreness, but no  longer has shooting pain that makes her fearful of stepping onto that leg. Her FOTO score has improved to 99/100 indicating excellent improvement in self-reported function, her MMT strength has improved, as has her pain and functional activity tolerance. She has been provided with an effective HEP to continue improvement independently and reduce change of problem returning. Patient is now being discharged from physical therapy care due to improvement in condition and meeting goals.      Rehab Potential  Good    PT Frequency  2x / week    PT Duration  6 weeks    PT Treatment/Interventions  ADLs/Self Care Home Management;Cryotherapy;Moist Heat;Stair training;Functional mobility training;Therapeutic activities;Therapeutic exercise;Balance training;Neuromuscular re-education;Patient/family education;Manual techniques;Passive range of motion;Dry needling;Taping;Joint Manipulations;Spinal Manipulations;Other (comment)   joint mobilizations grades I-IV   PT Next Visit Plan  Patient is now discharged from skilled physical therapy due to meeting goals.     PT Home Exercise Plan  Medbridge Access Code: [email or web address]     Consulted and Agree with Plan of Care  Patient       Patient will benefit from skilled therapeutic intervention in order to improve the  following deficits and impairments:  Decreased endurance, Decreased mobility, Difficulty walking, Decreased range of motion, Impaired perceived functional ability, Other (comment), Decreased activity tolerance, Decreased strength, Pain, Impaired sensation(decreased knowledge of appropriate self-management techniques)  Visit Diagnosis: Acute pain of right knee  Pain in left hip  Chronic low back pain, unspecified back pain laterality, unspecified whether sciatica present  Difficulty in walking, not elsewhere classified  Muscle weakness (generalized)     Problem List Patient Active Problem List   Diagnosis Date Noted  . Hip pain, chronic, left  09/29/2018  . Bone pain of lower leg 09/29/2018  . Pain of right tibia 09/29/2018  . Hyperlipidemia associated with type 2 diabetes mellitus ([place]) 03/29/2018  . Bilateral pes planus 03/29/2018  . SUI (stress urinary incontinence, female) 11/23/2017  . Vaginal atrophy 11/23/2017  . Obesity (BMI 30.0-34.9) 06/20/2017  . Carpal tunnel syndrome on right 06/20/2017  . Endometrioid adenocarcinoma of uterus ([place]) 01/19/2017  . OSA on CPAP 11/24/2016  . Cyst of ovary 11/16/2016  . Cervical stenosis (uterine cervix) 11/16/2016  . Cyst, vulva 10/11/2015  . CKD (chronic kidney disease) stage 3, GFR 30-59 ml/min (HCC) 11/07/2014  . GERD (gastroesophageal reflux disease) 12/26/2013  . Irritable bowel syndrome with constipation 12/26/2013  . Encounter for preventive health examination 10/21/2013  . Hyperlipidemia LDL goal <100 07/15/2013  . Chronic venous insufficiency 03/21/2013  . DM (diabetes mellitus), type 2 with renal complications ([place]) 93/07/2161  . Lack of libido 11/05/2012  . Barrett's esophagus 11/05/2012  . Hiatal hernia 11/05/2012  . Essential hypertension 11/05/2012    Nancy Nordmann, PT, DPT  11/20/2018, 5:24 PM  [facility] [address], [place], [address] Phone: [phone]   Fax:  [phone]  Name: Sandra Hendrix MRN: [id number] Date of Birth: 29-Jun-1946

## 2018-11-29 ENCOUNTER — Encounter: Payer: Medicare Other | Admitting: Obstetrics and Gynecology

## 2018-11-30 ENCOUNTER — Encounter: Payer: Self-pay | Admitting: Obstetrics and Gynecology

## 2018-11-30 ENCOUNTER — Ambulatory Visit: Payer: Medicare Other | Admitting: Obstetrics and Gynecology

## 2018-11-30 VITALS — BP 122/70 | HR 73 | Ht 60.75 in | Wt 172.3 lb

## 2018-11-30 DIAGNOSIS — Z01419 Encounter for gynecological examination (general) (routine) without abnormal findings: Secondary | ICD-10-CM

## 2018-11-30 DIAGNOSIS — Z8542 Personal history of malignant neoplasm of other parts of uterus: Secondary | ICD-10-CM | POA: Diagnosis not present

## 2018-11-30 DIAGNOSIS — Z1211 Encounter for screening for malignant neoplasm of colon: Secondary | ICD-10-CM

## 2018-11-30 DIAGNOSIS — Z780 Asymptomatic menopausal state: Secondary | ICD-10-CM

## 2018-11-30 DIAGNOSIS — Z1239 Encounter for other screening for malignant neoplasm of breast: Secondary | ICD-10-CM | POA: Diagnosis not present

## 2018-11-30 NOTE — Progress Notes (Signed)
ANNUAL PREVENTATIVE CARE GYNECOLOGY  ENCOUNTER NOTE  Subjective:       Sandra Hendrix is a 73 y.o. G24P2002 female here with PMH of well-differentiated endometrioid adenocarcinoma the uterus, Stage IB s/p hysterectomy BSO with sentinel lymph node sampling in 02/2017, status post pelvic radiation therapy, presents for interval follow-up and for a routine annual gynecologic exam. The patient is not sexually active. The patient is not taking hormone replacement therapy. Patient denies post-menopausal vaginal bleeding. The patient wears seatbelts: yes. The patient participates in regular exercise: yes. Has the patient ever been transfused or tattooed?: no. The patient reports that there is not domestic violence in her life.  Current complaints: 1.  None    Gynecologic History No LMP recorded. Patient has had a hysterectomy. Contraception: post menopausal status and patient has had a hysterectomy. Last Pap: 10/17/2016. Results were: normal Last mammogram: 02/26/2018. Results were: normal Last Colonoscopy: 06/14/2016. Results were: diverticulosis and internal hemorrhoids, but otherwise normal.  Last Dexa Scan: 10/23/18. Results were: normal.  T-score 0.3.  PCP: Dr. Deborra Medina.    Obstetric History OB History  Gravida Para Term Preterm AB Living  2 2 2     2   SAB TAB Ectopic Multiple Live Births          2    # Outcome Date GA Lbr Len/2nd Weight Sex Delivery Anes PTL Lv  2 Term 1972   9 lb (4.082 kg) F Vag-Spont   LIV  1 Term 1970   9 lb (4.082 kg) F Vag-Spont   LIV    Past Medical History:  Diagnosis Date  . Allergy   . Anxiety   . Arthritis   . Barrett's esophagus determined by endoscopy   . Cancer [facility])    Uterine Cancer  . Chronic kidney disease   . Depression   . Diabetes mellitus without complication ([place])   . Difficult intubation    "not sure what happened,asleep was told had problem with intubation"  . Diverticulosis   . GERD (gastroesophageal reflux disease)   .  History of bronchitis   . History of hiatal hernia   . Hyperlipidemia   . Hypertension   . Sleep apnea    Use C- PAP  . Urinary incontinence   . Varicose veins     Family History  Problem Relation Age of Onset  . Stroke Mother   . Hypertension Mother   . Lupus Mother   . Alcohol abuse Father   . Diabetes Father   . Cancer Father        gallbladder ca into liver   . Arthritis Sister   . Diabetes Sister   . Lumbar disc disease Sister   . Hyperlipidemia Daughter   . Hypertension Daughter   . Cancer Maternal Aunt        ovarian ca  . Diabetes Paternal Aunt   . Drug abuse Paternal Aunt   . Cancer Maternal Grandfather        colon ca  . Hypertension Brother     Past Surgical History:  Procedure Laterality Date  . ABDOMINAL HYSTERECTOMY  03/01/2017  . CHOLECYSTECTOMY    . COLONOSCOPY WITH PROPOFOL N/A 06/14/2016   Procedure: COLONOSCOPY WITH PROPOFOL;  Surgeon: Lollie Sails, MD;  Location: [facility] ENDOSCOPY;  Service: Endoscopy;  Laterality: N/A;  . ESOPHAGOGASTRODUODENOSCOPY (EGD) WITH PROPOFOL N/A 06/14/2016   Procedure: ESOPHAGOGASTRODUODENOSCOPY (EGD) WITH PROPOFOL;  Surgeon: Lollie Sails, MD;  Location: [facility] ENDOSCOPY;  Service: Endoscopy;  Laterality:  N/A;  . ESOPHAGOGASTRODUODENOSCOPY (EGD) WITH PROPOFOL N/A 06/12/2017   Procedure: ESOPHAGOGASTRODUODENOSCOPY (EGD) WITH PROPOFOL;  Surgeon: Lollie Sails, MD;  Location: [facility] ENDOSCOPY;  Service: Endoscopy;  Laterality: N/A;  . ESOPHAGOGASTRODUODENOSCOPY (EGD) WITH PROPOFOL N/A 08/25/2017   Procedure: ESOPHAGOGASTRODUODENOSCOPY (EGD) WITH PROPOFOL;  Surgeon: Lollie Sails, MD;  Location: [facility] ENDOSCOPY;  Service: Endoscopy;  Laterality: N/A;  . HYSTEROSCOPY W/D&C N/A 01/02/2017   Procedure: DILATATION AND CURETTAGE /HYSTEROSCOPY;  Surgeon: Brayton Mars, MD;  Location: [facility] ORS;  Service: Gynecology;  Laterality: N/A;  . JOINT REPLACEMENT Right 2007   Total Knee Replacement  . JOINT REPLACEMENT Left  2004   Total Knee Replacement  . LAPAROSCOPIC HYSTERECTOMY Bilateral 03/01/2017   Procedure: HYSTERECTOMY TOTAL LAPAROSCOPIC BSO;  Surgeon: Mellody Drown, MD;  Location: [facility] ORS;  Service: Gynecology;  Laterality: Bilateral;  . ROTATOR CUFF REPAIR Right   . SENTINEL NODE BIOPSY N/A 03/01/2017   Procedure: SENTINEL NODE INJECTION AND BIOPSY;  Surgeon: Mellody Drown, MD;  Location: [facility] ORS;  Service: Gynecology;  Laterality: N/A;  . TONSILLECTOMY     as a child  . TUBAL LIGATION    . UPPER GI ENDOSCOPY     x2    Social History   Socioeconomic History  . Marital status: Married    Spouse name: Not on file  . Number of children: 2  . Years of education: 108  . Highest education level: Some college, no degree  Occupational History  . Not on file  Social Needs  . Financial resource strain: Not on file  . Food insecurity:    Worry: Never true    Inability: Never true  . Transportation needs:    Medical: No    Non-medical: No  Tobacco Use  . Smoking status: Never Smoker  . Smokeless tobacco: Never Used  Substance and Sexual Activity  . Alcohol use: Yes    Comment: rare  . Drug use: No  . Sexual activity: Yes    Comment: not often  Lifestyle  . Physical activity:    Days per week: 3 days    Minutes per session: 60 min  . Stress: Not at all  Relationships  . Social connections:    Talks on phone: More than three times a week    Gets together: More than three times a week    Attends religious service: More than 4 times per year    Active member of club or organization: Yes    Attends meetings of clubs or organizations: More than 4 times per year    Relationship status: Married  . Intimate partner violence:    Fear of current or ex partner: No    Emotionally abused: No    Physically abused: No    Forced sexual activity: No  Other Topics Concern  . Not on file  Social History Narrative  . Not on file    Current Outpatient Medications on File Prior to Visit    Medication Sig Dispense Refill  . ACCU-CHEK AVIVA PLUS test strip TEST TWO TIMES DAILY 200 each 1  . acetaminophen (TYLENOL) 500 MG tablet Take 2 tablets (1,000 mg total) by mouth every 6 (six) hours. (Patient taking differently: Take 1,000 mg every 6 (six) hours as needed by mouth. ) 65 tablet 1  . albuterol (PROVENTIL HFA;VENTOLIN HFA) 108 (90 BASE) MCG/ACT inhaler Inhale 2 puffs into the lungs every 6 (six) hours as needed for wheezing or shortness of breath.     Marland Kitchen aspirin  EC 81 MG tablet Take 81 mg by mouth every evening.     Marland Kitchen atorvastatin (LIPITOR) 40 MG tablet Take 20 mg by mouth daily.    Marland Kitchen azelastine (ASTELIN) 0.1 % nasal spray Place 1 spray into both nostrils daily.    . Biotin 1000 MCG tablet Take 1,000 mcg by mouth 2 (two) times daily.    . Cholecalciferol (D3-1000 PO) Take 1 tablet by mouth daily.     Verneita Griffes Bark POWD Take by mouth.    . COLLAGEN PO Take by mouth 2 (two) times daily. For improved fingernail strength    . cyclobenzaprine (FLEXERIL) 10 MG tablet Take 10 mg by mouth daily as needed for muscle spasms.    Marland Kitchen docusate sodium (COLACE) 100 MG capsule Take 1 capsule (100 mg total) by mouth 2 (two) times daily. 20 capsule 0  . fish oil-omega-3 fatty acids 1000 MG capsule Take 1 g by mouth every evening.     . fluticasone (FLONASE) 50 MCG/ACT nasal spray Place 1 spray into both nostrils daily. 48 g 1  . furosemide (LASIX) 20 MG tablet TAKE 1 TABLET BY MOUTH  EVERY OTHER DAY 45 tablet 1  . glipiZIDE (GLUCOTROL XL) 5 MG 24 hr tablet TAKE 1 TABLET BY MOUTH  DAILY 90 tablet 1  . JANUVIA 100 MG tablet TAKE 1 TABLET BY MOUTH  DAILY 90 tablet 3  . Lancet Devices (ACCU-CHEK SOFTCLIX) lancets Use to test blood sugars 1 -2 times daily 1 each 3  . letrozole (FEMARA) 2.5 MG tablet Take 1 tablet (2.5 mg total) by mouth daily. 90 tablet 2  . LEVEMIR 100 UNIT/ML injection INJECT 20 UNITS SUBCUTANEOUSLY AT BEDTIME *DISCARD AFTER 42 DAYS* 10 mL 2  . loratadine (CLARITIN) 10 MG tablet Take  10 mg by mouth daily.    Marland Kitchen losartan-hydrochlorothiazide (HYZAAR) 50-12.5 MG tablet TAKE 1 TABLET BY MOUTH  DAILY 90 tablet 1  . Magnesium 250 MG TABS Take 1 tablet by mouth daily.    . meclizine (ANTIVERT) 25 MG tablet Take 25 mg by mouth 2 (two) times daily as needed for dizziness.    . metFORMIN (GLUCOPHAGE-XR) 750 MG 24 hr tablet TAKE 2 TABLETS BY MOUTH  DAILY WITH BREAKFAST 180 tablet 1  . Multiple Vitamin (MULTIVITAMIN) tablet Take 1 tablet by mouth daily.    . NON FORMULARY medroxy progesterone one 10mg  pill once daily    . pantoprazole (PROTONIX) 40 MG tablet Take 1 tablet (40 mg total) by mouth 2 (two) times daily. 180 tablet 1  . Polyethyl Glycol-Propyl Glycol (SYSTANE) 0.4-0.3 % SOLN Apply to eye.    Marland Kitchen Potassium 99 MG TABS Take 1 tablet by mouth daily.    . Probiotic Product (PROBIOTIC-10) CAPS Take 1 capsule by mouth daily.     . sertraline (ZOLOFT) 50 MG tablet TAKE 1 TABLET BY MOUTH  DAILY 90 tablet 1  . verapamil (VERELAN PM) 180 MG 24 hr capsule TAKE 1 CAPSULE BY MOUTH  DAILY 90 capsule 1  . vitamin B-12 (CYANOCOBALAMIN) 1000 MCG tablet Take 1,000 mcg by mouth daily.    . vitamin C (ASCORBIC ACID) 500 MG tablet Take 500 mg by mouth daily.     No current facility-administered medications on file prior to visit.     Allergies  Allergen Reactions  . Clindamycin/Lincomycin Rash  . Codeine Rash  . Lincomycin Rash  . Morphine And Related Nausea Only  . Talwin [Pentazocine] Nausea And Vomiting  . Lisinopril Cough  cough  . Naloxone Other (See Comments)    Had issues with sedation and vomiting with EGD  . Oxycodone-Acetaminophen Other (See Comments)    Pt states this medication makes her bowels stop moving      Review of Systems ROS Review of Systems - General ROS: negative for - chills, fatigue, fever, hot flashes, night sweats, weight gain or weight loss Psychological ROS: negative for - anxiety, decreased libido, depression, mood swings, physical abuse or sexual  abuse Ophthalmic ROS: negative for - blurry vision, eye pain or loss of vision ENT ROS: negative for - headaches, hearing change, visual changes or vocal changes Allergy and Immunology ROS: negative for - hives, itchy/watery eyes or seasonal allergies Hematological and Lymphatic ROS: negative for - bleeding problems, bruising, swollen lymph nodes or weight loss Endocrine ROS: negative for - galactorrhea, hair pattern changes, hot flashes, malaise/lethargy, mood swings, palpitations, polydipsia/polyuria, skin changes, temperature intolerance or unexpected weight changes Breast ROS: negative for - new or changing breast lumps or nipple discharge Respiratory ROS: negative for - cough or shortness of breath Cardiovascular ROS: negative for - chest pain, irregular heartbeat, palpitations or shortness of breath Gastrointestinal ROS: no abdominal pain, change in bowel habits, or black or bloody stools Genito-Urinary ROS: no dysuria, trouble voiding, or hematuria Musculoskeletal ROS: negative for - joint pain or joint stiffness Neurological ROS: negative for - bowel and bladder control changes Dermatological ROS: negative for rash and skin lesion changes   Objective:   BP 122/70   Pulse 73   Ht 5' 0.75" (1.543 m)   Wt 172 lb 4.8 oz (78.2 kg)   BMI 32.82 kg/m  CONSTITUTIONAL: Well-developed, well-nourished female in no acute distress.  PSYCHIATRIC: Normal mood and affect. Normal behavior. Normal judgment and thought content. [place]: Alert and oriented to person, place, and time. Normal muscle tone coordination. No cranial nerve deficit noted. HENT:  Normocephalic, atraumatic, External right and left ear normal. Oropharynx is clear and moist EYES: Conjunctivae and EOM are normal. Pupils are equal, round, and reactive to light. No scleral icterus.  NECK: Normal range of motion, supple, no masses.  Normal thyroid.  SKIN: Skin is warm and dry. No rash noted. Not diaphoretic. No erythema. No  pallor. CARDIOVASCULAR: Normal heart rate noted, regular rhythm, no murmur. RESPIRATORY: Clear to auscultation bilaterally. Effort and breath sounds normal, no problems with respiration noted. BREASTS: Symmetric in size. No masses, skin changes, nipple drainage, or lymphadenopathy. ABDOMEN: Soft, normal bowel sounds, no distention noted.  No tenderness, rebound or guarding.  BLADDER: Normal PELVIC:  Bladder no bladder distension noted  Urethra: normal appearing urethra with no masses, tenderness or lesions  Vulva: normal appearing vulva with no masses, tenderness or lesions  Vagina: atrophic, moderate.  No lesions or masses.  First degree cystocele.   Cervix: surgically absent  Uterus: surgically absent, vaginal cuff well healed  Adnexa: surgically absent bilateral  RV: External Exam NormaI, No Rectal Masses and Normal Sphincter tone  MUSCULOSKELETAL: Normal range of motion. No tenderness.  No cyanosis, clubbing, or edema.  2+ distal pulses. LYMPHATIC: No Axillary, Supraclavicular, or Inguinal Adenopathy.   Labs: Lab Results  Component Value Date   WBC 4.2 05/15/2017   HGB 12.0 05/15/2017   HCT 34.5 (L) 05/15/2017   MCV 88.0 05/15/2017   PLT 316 05/15/2017    Lab Results  Component Value Date   CREATININE 1.01 09/27/2018   BUN 24 (H) 09/27/2018   NA 141 09/27/2018   K 3.6 09/27/2018  CL 102 09/27/2018   CO2 30 09/27/2018    Lab Results  Component Value Date   ALT 15 09/27/2018   AST 19 09/27/2018   ALKPHOS 110 09/27/2018   BILITOT 0.5 09/27/2018    Lab Results  Component Value Date   CHOL 194 09/27/2018   HDL 87.80 09/27/2018   LDLCALC 93 09/27/2018   LDLDIRECT 80.0 02/22/2017   TRIG 67.0 09/27/2018   CHOLHDL 2 09/27/2018    Lab Results  Component Value Date   TSH 2.78 09/27/2018    Lab Results  Component Value Date   HGBA1C 6.3 09/27/2018     Assessment:   Encounter for well woman exam with routine gynecological exam History of endometrial  cancer Menopause Breast screening  Plan:  Pap: Not needed. Patient is s/p hysterectomy and beyond age-based screening. Mammogram: Ordered Stool Guaiac Testing:  Ordered. Patient Is also up to date on colonoscopy.  Labs: No labs ordered. Plans to have labs done with PCP in April Routine preventative health maintenance measures emphasized: Exercise/Diet/Weight control, Tobacco Warnings, Alcohol/Substance use risks and Stress Management Up to date on flu vaccine.  Continue q 6 month screening rotating between Oncology and GYN. Continue Letrozole.  Return to [place], MD  [facility]

## 2018-11-30 NOTE — Patient Instructions (Signed)
Health Maintenance for Postmenopausal Women Menopause is a normal process in which your reproductive ability comes to an end. This process happens gradually over a span of months to years, usually between the ages of 62 and 89. Menopause is complete when you have missed 12 consecutive menstrual periods. It is important to talk with your health care provider about some of the most common conditions that affect postmenopausal women, such as heart disease, cancer, and bone loss (osteoporosis). Adopting a healthy lifestyle and getting preventive care can help to promote your health and wellness. Those actions can also lower your chances of developing some of these common conditions. What should I know about menopause? During menopause, you may experience a number of symptoms, such as:  Moderate-to-severe hot flashes.  Night sweats.  Decrease in sex drive.  Mood swings.  Headaches.  Tiredness.  Irritability.  Memory problems.  Insomnia. Choosing to treat or not to treat menopausal changes is an individual decision that you make with your health care provider. What should I know about hormone replacement therapy and supplements? Hormone therapy products are effective for treating symptoms that are associated with menopause, such as hot flashes and night sweats. Hormone replacement carries certain risks, especially as you become older. If you are thinking about using estrogen or estrogen with progestin treatments, discuss the benefits and risks with your health care provider. What should I know about heart disease and stroke? Heart disease, heart attack, and stroke become more likely as you age. This may be due, in part, to the hormonal changes that your body experiences during menopause. These can affect how your body processes dietary fats, triglycerides, and cholesterol. Heart attack and stroke are both medical emergencies. There are many things that you can do to help prevent heart disease  and stroke:  Have your blood pressure checked at least every 1-2 years. High blood pressure causes heart disease and increases the risk of stroke.  If you are 79-72 years old, ask your health care provider if you should take aspirin to prevent a heart attack or a stroke.  Do not use any tobacco products, including cigarettes, chewing tobacco, or electronic cigarettes. If you need help quitting, ask your health care provider.  It is important to eat a healthy diet and maintain a healthy weight. ? Be sure to include plenty of vegetables, fruits, low-fat dairy products, and lean protein. ? Avoid eating foods that are high in solid fats, added sugars, or salt (sodium).  Get regular exercise. This is one of the most important things that you can do for your health. ? Try to exercise for at least 150 minutes each week. The type of exercise that you do should increase your heart rate and make you sweat. This is known as moderate-intensity exercise. ? Try to do strengthening exercises at least twice each week. Do these in addition to the moderate-intensity exercise.  Know your numbers.Ask your health care provider to check your cholesterol and your blood glucose. Continue to have your blood tested as directed by your health care provider.  What should I know about cancer screening? There are several types of cancer. Take the following steps to reduce your risk and to catch any cancer development as early as possible. Breast Cancer  Practice breast self-awareness. ? This means understanding how your breasts normally appear and feel. ? It also means doing regular breast self-exams. Let your health care provider know about any changes, no matter how small.  If you are 40 or  older, have a clinician do a breast exam (clinical breast exam or CBE) every year. Depending on your age, family history, and medical history, it may be recommended that you also have a yearly breast X-ray (mammogram).  If you  have a family history of breast cancer, talk with your health care provider about genetic screening.  If you are at high risk for breast cancer, talk with your health care provider about having an MRI and a mammogram every year.  Breast cancer (BRCA) gene test is recommended for women who have family members with BRCA-related cancers. Results of the assessment will determine the need for genetic counseling and BRCA1 and for BRCA2 testing. BRCA-related cancers include these types: ? Breast. This occurs in males or females. ? Ovarian. ? Tubal. This may also be called fallopian tube cancer. ? Cancer of the abdominal or pelvic lining (peritoneal cancer). ? Prostate. ? Pancreatic. Cervical, Uterine, and Ovarian Cancer Your health care provider may recommend that you be screened regularly for cancer of the pelvic organs. These include your ovaries, uterus, and vagina. This screening involves a pelvic exam, which includes checking for microscopic changes to the surface of your cervix (Pap test).  For women ages 21-65, health care providers may recommend a pelvic exam and a Pap test every three years. For women ages 39-65, they may recommend the Pap test and pelvic exam, combined with testing for human papilloma virus (HPV), every five years. Some types of HPV increase your risk of cervical cancer. Testing for HPV may also be done on women of any age who have unclear Pap test results.  Other health care providers may not recommend any screening for nonpregnant women who are considered low risk for pelvic cancer and have no symptoms. Ask your health care provider if a screening pelvic exam is right for you.  If you have had past treatment for cervical cancer or a condition that could lead to cancer, you need Pap tests and screening for cancer for at least 20 years after your treatment. If Pap tests have been discontinued for you, your risk factors (such as having a new sexual partner) need to be reassessed  to determine if you should start having screenings again. Some women have medical problems that increase the chance of getting cervical cancer. In these cases, your health care provider may recommend that you have screening and Pap tests more often.  If you have a family history of uterine cancer or ovarian cancer, talk with your health care provider about genetic screening.  If you have vaginal bleeding after reaching menopause, tell your health care provider.  There are currently no reliable tests available to screen for ovarian cancer. Lung Cancer Lung cancer screening is recommended for adults 57-50 years old who are at high risk for lung cancer because of a history of smoking. A yearly low-dose CT scan of the lungs is recommended if you:  Currently smoke.  Have a history of at least 30 pack-years of smoking and you currently smoke or have quit within the past 15 years. A pack-year is smoking an average of one pack of cigarettes per day for one year. Yearly screening should:  Continue until it has been 15 years since you quit.  Stop if you develop a health problem that would prevent you from having lung cancer treatment. Colorectal Cancer  This type of cancer can be detected and can often be prevented.  Routine colorectal cancer screening usually begins at age 12 and continues through  age 75.  If you have risk factors for colon cancer, your health care provider may recommend that you be screened at an earlier age.  If you have a family history of colorectal cancer, talk with your health care provider about genetic screening.  Your health care provider may also recommend using home test kits to check for hidden blood in your stool.  A small camera at the end of a tube can be used to examine your colon directly (sigmoidoscopy or colonoscopy). This is done to check for the earliest forms of colorectal cancer.  Direct examination of the colon should be repeated every 5-10 years until  age 75. However, if early forms of precancerous polyps or small growths are found or if you have a family history or genetic risk for colorectal cancer, you may need to be screened more often. Skin Cancer  Check your skin from head to toe regularly.  Monitor any moles. Be sure to tell your health care provider: ? About any new moles or changes in moles, especially if there is a change in a mole's shape or color. ? If you have a mole that is larger than the size of a pencil eraser.  If any of your family members has a history of skin cancer, especially at a young age, talk with your health care provider about genetic screening.  Always use sunscreen. Apply sunscreen liberally and repeatedly throughout the day.  Whenever you are outside, protect yourself by wearing long sleeves, pants, a wide-brimmed hat, and sunglasses. What should I know about osteoporosis? Osteoporosis is a condition in which bone destruction happens more quickly than new bone creation. After menopause, you may be at an increased risk for osteoporosis. To help prevent osteoporosis or the bone fractures that can happen because of osteoporosis, the following is recommended:  If you are 19-50 years old, get at least 1,000 mg of calcium and at least 600 mg of vitamin D per day.  If you are older than age 50 but younger than age 70, get at least 1,200 mg of calcium and at least 600 mg of vitamin D per day.  If you are older than age 70, get at least 1,200 mg of calcium and at least 800 mg of vitamin D per day. Smoking and excessive alcohol intake increase the risk of osteoporosis. Eat foods that are rich in calcium and vitamin D, and do weight-bearing exercises several times each week as directed by your health care provider. What should I know about how menopause affects my mental health? Depression may occur at any age, but it is more common as you become older. Common symptoms of depression include:  Low or sad  mood.  Changes in sleep patterns.  Changes in appetite or eating patterns.  Feeling an overall lack of motivation or enjoyment of activities that you previously enjoyed.  Frequent crying spells. Talk with your health care provider if you think that you are experiencing depression. What should I know about immunizations? It is important that you get and maintain your immunizations. These include:  Tetanus, diphtheria, and pertussis (Tdap) booster vaccine.  Influenza every year before the flu season begins.  Pneumonia vaccine.  Shingles vaccine. Your health care provider may also recommend other immunizations. This information is not intended to replace advice given to you by your health care provider. Make sure you discuss any questions you have with your health care provider. Document Released: 11/04/2005 Document Revised: 04/01/2016 Document Reviewed: 06/16/2015 Elsevier Interactive Patient Education    2019 [place].

## 2018-12-01 ENCOUNTER — Encounter: Payer: Self-pay | Admitting: Obstetrics and Gynecology

## 2018-12-01 NOTE — Addendum Note (Signed)
Addended by: Augusto Gamble on: 12/01/2018 03:37 PM   Modules accepted: Orders

## 2018-12-06 MED ORDER — FLUTICASONE PROPIONATE 50 MCG/ACT NA SUSP: 1.0000 | Freq: Every day | NASAL | 1 refills | Status: DC

## 2018-12-06 MED ORDER — GLIPIZIDE ER 5 MG PO TB24: 5.0000 mg | ORAL_TABLET | Freq: Every day | ORAL | 1 refills | Status: DC

## 2018-12-06 MED ORDER — ACCU-CHEK AVIVA PLUS VI STRP: ORAL_STRIP | 1 refills | Status: DC

## 2018-12-06 MED ORDER — LOSARTAN POTASSIUM-HCTZ 50-12.5 MG PO TABS: 1.0000 | ORAL_TABLET | Freq: Every day | ORAL | 1 refills | Status: DC

## 2018-12-11 ENCOUNTER — Other Ambulatory Visit: Payer: Self-pay | Admitting: Internal Medicine

## 2018-12-14 ENCOUNTER — Ambulatory Visit: Payer: Medicare Other

## 2018-12-22 ENCOUNTER — Other Ambulatory Visit: Payer: Self-pay | Admitting: Internal Medicine

## 2019-01-20 ENCOUNTER — Other Ambulatory Visit: Payer: Self-pay | Admitting: Internal Medicine

## 2019-02-10 ENCOUNTER — Other Ambulatory Visit: Payer: Self-pay | Admitting: Internal Medicine

## 2019-03-02 ENCOUNTER — Other Ambulatory Visit: Payer: Self-pay | Admitting: Internal Medicine

## 2019-03-28 ENCOUNTER — Ambulatory Visit (INDEPENDENT_AMBULATORY_CARE_PROVIDER_SITE_OTHER): Payer: Medicare Other | Admitting: Internal Medicine

## 2019-03-28 ENCOUNTER — Encounter: Payer: Self-pay | Admitting: Internal Medicine

## 2019-03-28 ENCOUNTER — Other Ambulatory Visit: Payer: Self-pay

## 2019-03-28 ENCOUNTER — Telehealth: Payer: Self-pay

## 2019-03-28 DIAGNOSIS — H8109 Meniere's disease, unspecified ear: Secondary | ICD-10-CM | POA: Diagnosis not present

## 2019-03-28 DIAGNOSIS — N183 Chronic kidney disease, stage 3 unspecified: Secondary | ICD-10-CM

## 2019-03-28 DIAGNOSIS — G8929 Other chronic pain: Secondary | ICD-10-CM

## 2019-03-28 DIAGNOSIS — E1122 Type 2 diabetes mellitus with diabetic chronic kidney disease: Secondary | ICD-10-CM

## 2019-03-28 DIAGNOSIS — M25552 Pain in left hip: Secondary | ICD-10-CM | POA: Diagnosis not present

## 2019-03-28 DIAGNOSIS — Z794 Long term (current) use of insulin: Secondary | ICD-10-CM

## 2019-03-28 MED ORDER — MECLIZINE HCL 25 MG PO TABS: 25.0000 mg | ORAL_TABLET | Freq: Three times a day (TID) | ORAL | 3 refills | Status: AC | PRN

## 2019-03-28 MED ORDER — METFORMIN HCL 850 MG PO TABS: 850.0000 mg | ORAL_TABLET | Freq: Two times a day (BID) | ORAL | 0 refills | Status: DC

## 2019-03-28 MED ORDER — PREDNISONE 10 MG PO TABS: ORAL_TABLET | ORAL | 0 refills | Status: DC

## 2019-03-28 NOTE — Progress Notes (Signed)
Virtual Visit via [email or web address] Note  This visit type was conducted due to national recommendations for restrictions regarding the COVID-19 pandemic (e.g. social distancing).  This format is felt to be most appropriate for this patient at this time.  All issues noted in this document were discussed and addressed.  No physical exam was performed (except for noted visual exam findings with Video Visits).   I connected with@ on 03/28/19 at  9:00 AM EDT by a video enabled telemedicine application or telephone and verified that I am speaking with the correct person using two identifiers. Location patient: home Location provider: work or home office Persons participating in the virtual visit: patient, provider  I discussed the limitations, risks, security and privacy concerns of performing an evaluation and management service by telephone and the availability of in person appointments. I also discussed with the patient that there may be a patient responsible charge related to this service. The patieassnt expressed understanding and agreed to proceed.   Reason for visit: acute and chronic conditions   HPI:  1) episode of vertigo occurred last evening, ACCOMPANIED BY nausea  and vomiting , preceded by Eustachian tub, ear pressure for 3 days  Without sinus drainage or sinus pain/congestion. Symptoms are nearly resolved today but she has been out of medication; refill on meclizine needed   2) follow up on Type 2 DM: fasting sugar  149,  79  Yesterday .  Taking 20 units of levemir  At 2 am.  Sleeps until very  late and stays up very late as well.  Chronically. .  finishes breakfast at 1:30 pm.  Milus Banister at 1:30 pm . takes the glipizide at 7 pm    3) Joint pain:  Right knee and right hip improving with Physical therpay done in February.  still uses tylenol prn. Has developed constant pain in the left hip for the last month.   4) CKD:  Secondary to diabetes.  Avoiding NSAIDS.  Sees Nephrology on July  9  ROS: See pertinent positives and negatives per HPI.  Past Medical History:  Diagnosis Date  . Allergy   . Anxiety   . Arthritis   . Barrett's esophagus determined by endoscopy   . Cancer [facility])    Uterine Cancer  . Chronic kidney disease   . Depression   . Diabetes mellitus without complication ([place])   . Difficult intubation    "not sure what happened,asleep was told had problem with intubation"  . Diverticulosis   . GERD (gastroesophageal reflux disease)   . History of bronchitis   . History of hiatal hernia   . Hyperlipidemia   . Hypertension   . Sleep apnea    Use C- PAP  . Urinary incontinence   . Varicose veins     Past Surgical History:  Procedure Laterality Date  . ABDOMINAL HYSTERECTOMY  03/01/2017  . CHOLECYSTECTOMY    . COLONOSCOPY WITH PROPOFOL N/A 06/14/2016   Procedure: COLONOSCOPY WITH PROPOFOL;  Surgeon: Lollie Sails, MD;  Location: [facility] ENDOSCOPY;  Service: Endoscopy;  Laterality: N/A;  . ESOPHAGOGASTRODUODENOSCOPY (EGD) WITH PROPOFOL N/A 06/14/2016   Procedure: ESOPHAGOGASTRODUODENOSCOPY (EGD) WITH PROPOFOL;  Surgeon: Lollie Sails, MD;  Location: [facility] ENDOSCOPY;  Service: Endoscopy;  Laterality: N/A;  . ESOPHAGOGASTRODUODENOSCOPY (EGD) WITH PROPOFOL N/A 06/12/2017   Procedure: ESOPHAGOGASTRODUODENOSCOPY (EGD) WITH PROPOFOL;  Surgeon: Lollie Sails, MD;  Location: [facility] ENDOSCOPY;  Service: Endoscopy;  Laterality: N/A;  . ESOPHAGOGASTRODUODENOSCOPY (EGD) WITH PROPOFOL N/A 08/25/2017   Procedure: ESOPHAGOGASTRODUODENOSCOPY (EGD)  WITH PROPOFOL;  Surgeon: Lollie Sails, MD;  Location: [facility] ENDOSCOPY;  Service: Endoscopy;  Laterality: N/A;  . HYSTEROSCOPY W/D&C N/A 01/02/2017   Procedure: DILATATION AND CURETTAGE /HYSTEROSCOPY;  Surgeon: Brayton Mars, MD;  Location: [facility] ORS;  Service: Gynecology;  Laterality: N/A;  . JOINT REPLACEMENT Right 2007   Total Knee Replacement  . JOINT REPLACEMENT Left 2004   Total Knee Replacement  .  LAPAROSCOPIC HYSTERECTOMY Bilateral 03/01/2017   Procedure: HYSTERECTOMY TOTAL LAPAROSCOPIC BSO;  Surgeon: Mellody Drown, MD;  Location: [facility] ORS;  Service: Gynecology;  Laterality: Bilateral;  . ROTATOR CUFF REPAIR Right   . SENTINEL NODE BIOPSY N/A 03/01/2017   Procedure: SENTINEL NODE INJECTION AND BIOPSY;  Surgeon: Mellody Drown, MD;  Location: [facility] ORS;  Service: Gynecology;  Laterality: N/A;  . TONSILLECTOMY     as a child  . TUBAL LIGATION    . UPPER GI ENDOSCOPY     x2    Family History  Problem Relation Age of Onset  . Stroke Mother   . Hypertension Mother   . Lupus Mother   . Alcohol abuse Father   . Diabetes Father   . Cancer Father        gallbladder ca into liver   . Arthritis Sister   . Diabetes Sister   . Lumbar disc disease Sister   . Hyperlipidemia Daughter   . Hypertension Daughter   . Cancer Maternal Aunt        ovarian ca  . Diabetes Paternal Aunt   . Drug abuse Paternal Aunt   . Cancer Maternal Grandfather        colon ca  . Hypertension Brother     SOCIAL HX:  reports that she has never smoked. She has never used smokeless tobacco. She reports current alcohol use. She reports that she does not use drugs.   Current Outpatient Medications:  .  ACCU-CHEK AVIVA PLUS test strip, TEST TWO TIMES DAILY, Disp: 200 each, Rfl: 1 .  acetaminophen (TYLENOL) 500 MG tablet, Take 2 tablets (1,000 mg total) by mouth every 6 (six) hours. (Patient taking differently: Take 1,000 mg every 6 (six) hours as needed by mouth. ), Disp: 65 tablet, Rfl: 1 .  albuterol (PROVENTIL HFA;VENTOLIN HFA) 108 (90 BASE) MCG/ACT inhaler, Inhale 2 puffs into the lungs every 6 (six) hours as needed for wheezing or shortness of breath. , Disp: , Rfl:  .  aspirin EC 81 MG tablet, Take 81 mg by mouth every evening. , Disp: , Rfl:  .  atorvastatin (LIPITOR) 40 MG tablet, Take 20 mg by mouth daily., Disp: , Rfl:  .  azelastine (ASTELIN) 0.1 % nasal spray, Place 1 spray into both nostrils  daily., Disp: , Rfl:  .  Biotin 1000 MCG tablet, Take 1,000 mcg by mouth 2 (two) times daily., Disp: , Rfl:  .  Cholecalciferol (D3-1000 PO), Take 1 tablet by mouth daily. , Disp: , Rfl:  .  Cinnamon Bark POWD, Take by mouth., Disp: , Rfl:  .  COLLAGEN PO, Take by mouth 2 (two) times daily. For improved fingernail strength, Disp: , Rfl:  .  cyclobenzaprine (FLEXERIL) 10 MG tablet, Take 10 mg by mouth daily as needed for muscle spasms., Disp: , Rfl:  .  docusate sodium (COLACE) 100 MG capsule, Take 1 capsule (100 mg total) by mouth 2 (two) times daily., Disp: 20 capsule, Rfl: 0 .  fish oil-omega-3 fatty acids 1000 MG capsule, Take 1 g by mouth every evening. , Disp: ,  Rfl:  .  fluticasone (FLONASE) 50 MCG/ACT nasal spray, Place 1 spray into both nostrils daily., Disp: 48 g, Rfl: 1 .  furosemide (LASIX) 20 MG tablet, TAKE 1 TABLET BY MOUTH  EVERY OTHER DAY, Disp: 45 tablet, Rfl: 1 .  glipiZIDE (GLUCOTROL XL) 5 MG 24 hr tablet, TAKE 1 TABLET BY MOUTH  DAILY, Disp: 90 tablet, Rfl: 1 .  JANUVIA 100 MG tablet, TAKE 1 TABLET BY MOUTH  DAILY, Disp: 90 tablet, Rfl: 1 .  Lancet Devices (ACCU-CHEK SOFTCLIX) lancets, Use to test blood sugars 1 -2 times daily, Disp: 1 each, Rfl: 3 .  letrozole (FEMARA) 2.5 MG tablet, Take 1 tablet (2.5 mg total) by mouth daily., Disp: 90 tablet, Rfl: 2 .  LEVEMIR 100 UNIT/ML injection, INJECT 20 UNITS SUBCUTANEOUSLY AT BEDTIME *DISCARD AFTER 42 DAYS*, Disp: 10 mL, Rfl: 0 .  loratadine (CLARITIN) 10 MG tablet, Take 10 mg by mouth daily., Disp: , Rfl:  .  losartan-hydrochlorothiazide (HYZAAR) 50-12.5 MG tablet, TAKE 1 TABLET BY MOUTH  DAILY, Disp: 90 tablet, Rfl: 1 .  Magnesium 250 MG TABS, Take 1 tablet by mouth daily., Disp: , Rfl:  .  meclizine (ANTIVERT) 25 MG tablet, Take 1 tablet (25 mg total) by mouth 3 (three) times daily as needed for dizziness., Disp: 30 tablet, Rfl: 3 .  Multiple Vitamin (MULTIVITAMIN) tablet, Take 1 tablet by mouth daily., Disp: , Rfl:  .  NON  FORMULARY, medroxy progesterone one 10mg  pill once daily, Disp: , Rfl:  .  pantoprazole (PROTONIX) 40 MG tablet, Take 1 tablet by mouth twice daily, Disp: 180 tablet, Rfl: 0 .  Polyethyl Glycol-Propyl Glycol (SYSTANE) 0.4-0.3 % SOLN, Apply to eye., Disp: , Rfl:  .  Potassium 99 MG TABS, Take 1 tablet by mouth daily., Disp: , Rfl:  .  Probiotic Product (PROBIOTIC-10) CAPS, Take 1 capsule by mouth daily. , Disp: , Rfl:  .  sertraline (ZOLOFT) 50 MG tablet, TAKE 1 TABLET BY MOUTH  DAILY, Disp: 90 tablet, Rfl: 1 .  verapamil (VERELAN PM) 180 MG 24 hr capsule, TAKE 1 CAPSULE BY MOUTH  DAILY, Disp: 90 capsule, Rfl: 1 .  vitamin B-12 (CYANOCOBALAMIN) 1000 MCG tablet, Take 1,000 mcg by mouth daily., Disp: , Rfl:  .  vitamin C (ASCORBIC ACID) 500 MG tablet, Take 500 mg by mouth daily., Disp: , Rfl:  .  metFORMIN (GLUCOPHAGE) 850 MG tablet, Take 1 tablet (850 mg total) by mouth 2 (two) times daily with a meal., Disp: 90 tablet, Rfl: 0 .  predniSONE (DELTASONE) 10 MG tablet, 6 tablets on Day 1 , then reduce by 1 tablet daily until gone, Disp: 21 tablet, Rfl: 0  EXAM:  VITALS per patient if applicable:  GENERAL: alert, oriented, appears well and in no acute distress  HEENT: atraumatic, conjunttiva clear, no obvious abnormalities on inspection of external nose and ears  NECK: normal movements of the head and neck  LUNGS: on inspection no signs of respiratory distress, breathing rate appears normal, no obvious gross SOB, gasping or wheezing  CV: no obvious cyanosis  MS: moves all visible extremities without noticeable abnormality  PSYCH/NEURO: pleasant and cooperative, no obvious depression or anxiety, speech and thought processing grossly intact  ASSESSMENT AND PLAN:  Discussed the following assessment and plan:   CKD (chronic kidney disease) stage 3, GFR 30-59 ml/min Stable, Monitored semi annually by [facility].  GFR has been stable at 60 ml/min  Workup was notable only for a congenitally small  right kidney  By U/S.  Lab Results  Component Value Date   MICROALBUR <0.7 09/27/2018   Lab Results  Component Value Date   CREATININE 1.01 09/27/2018     DM (diabetes mellitus), type 2 with renal complications (HCC) Her diabetes is Currently well-controlled on current medications (JANUVIA, metformin, Levemir and glipizide).  hemoglobin A1c is at goal of less than 7.0 . patient has no microalbuminuria by last check byt has decreaesd GSR that has been stable. . Patient is tolerating statin therapy for CAD risk reduction and on ACE/ARB for renal protection and hypertension   Lab Results  Component Value Date   HGBA1C 6.3 09/27/2018   Lab Results  Component Value Date   MICROALBUR <0.7 09/27/2018   Lab Results  Component Value Date   CREATININE 1.01 09/27/2018     Chronic hip pain, left Present for at least 6 week,  Now constant and affecting her ability to exercise.  Orthopedics  referral recommended.  May improve with current use of prednisone for vertigo secondary to eustachian tube dysfunction   Vertigo, labyrinthine, unspecified laterality Prednisone taper prescribed and meclizine refilled     I discussed the assessment and treatment plan with the patient. The patient was provided an opportunity to ask questions and all were answered. The patient agreed with the plan and demonstrated an understanding of the instructions.   The patient was advised to call back or seek an in-person evaluation if the symptoms worsen or if the condition fails to improve as anticipated.  I provided 25 minutes of non-face-to-face time during this encounter.   Crecencio Mc, MD

## 2019-03-28 NOTE — Telephone Encounter (Signed)
-----   Message from Crecencio Mc, MD sent at 03/28/2019  9:45 AM EDT ----- Janett Billow will call you to set up a fasting lab appt

## 2019-03-28 NOTE — Patient Instructions (Signed)
  You have  been  advised to stop taking Metformin XR in accordance with the recent recall of the drug due to unacceptable levels of [place] found in multiple lots from multiple pharmaceutical companies .  I have called in the immediate release metformin to use instead , twice daily  Before breakfast and dinner .  Decreased appetite and loose stools are  common side effects of the immediate release drug.    Take your glipizde BEFORE your evening meal   Referral to orthopedics is underway for your left hip   You can take  up to 2000 mg of acetominophen (tylenol) every day safely  In divided doses (500 mg every 6 hours  Or 1000 mg every 12 hours.)  Prednisone taper and meclizine sent to pharmacy for vertigo   Janett Billow will call you to set up a fasting lab appt

## 2019-03-28 NOTE — Telephone Encounter (Signed)
Appt is scheduled and pt is aware of appt date and time.

## 2019-03-30 DIAGNOSIS — H8109 Meniere's disease, unspecified ear: Secondary | ICD-10-CM | POA: Insufficient documentation

## 2019-03-30 NOTE — Assessment & Plan Note (Addendum)
Her diabetes is Currently well-controlled on current medications (JANUVIA, metformin, Levemir and glipizide).  hemoglobin A1c is at goal of less than 7.0 . patient has no microalbuminuria by last check byt has decreaesd GSR that has been stable. . Patient is tolerating statin therapy for CAD risk reduction and on ACE/ARB for renal protection and hypertension   Lab Results  Component Value Date   HGBA1C 6.3 09/27/2018   Lab Results  Component Value Date   MICROALBUR <0.7 09/27/2018   Lab Results  Component Value Date   CREATININE 1.01 09/27/2018

## 2019-03-30 NOTE — Assessment & Plan Note (Signed)
Stable, Monitored semi annually by [facility].  GFR has been stable at 60 ml/min  Workup was notable only for a congenitally small right kidney   By U/S.  Lab Results  Component Value Date   MICROALBUR <0.7 09/27/2018   Lab Results  Component Value Date   CREATININE 1.01 09/27/2018

## 2019-03-30 NOTE — Assessment & Plan Note (Signed)
Prednisone taper prescribed and meclizine refilled

## 2019-03-30 NOTE — Assessment & Plan Note (Signed)
Present for at least 6 week,  Now constant and affecting her ability to exercise.  Orthopedics  referral recommended.  May improve with current use of prednisone for vertigo secondary to eustachian tube dysfunction

## 2019-04-05 ENCOUNTER — Other Ambulatory Visit: Payer: Self-pay

## 2019-04-05 ENCOUNTER — Other Ambulatory Visit (INDEPENDENT_AMBULATORY_CARE_PROVIDER_SITE_OTHER): Payer: Medicare Other

## 2019-04-05 DIAGNOSIS — N183 Chronic kidney disease, stage 3 (moderate): Secondary | ICD-10-CM

## 2019-04-05 DIAGNOSIS — Z794 Long term (current) use of insulin: Secondary | ICD-10-CM | POA: Diagnosis not present

## 2019-04-05 DIAGNOSIS — E1122 Type 2 diabetes mellitus with diabetic chronic kidney disease: Secondary | ICD-10-CM

## 2019-04-05 LAB — COMPREHENSIVE METABOLIC PANEL
ALT: 15 U/L (ref 0–35)
AST: 15 U/L (ref 0–37)
Albumin: 4.5 g/dL (ref 3.5–5.2)
Alkaline Phosphatase: 98 U/L (ref 39–117)
BUN: 32 mg/dL — ABNORMAL HIGH (ref 6–23)
CO2: 31 mEq/L (ref 19–32)
Calcium: 9.5 mg/dL (ref 8.4–10.5)
Chloride: 99 mEq/L (ref 96–112)
Creatinine, Ser: 1.17 mg/dL (ref 0.40–1.20)
GFR: 45.09 mL/min — ABNORMAL LOW (ref >60.00)
Glucose, Bld: 101 mg/dL — ABNORMAL HIGH (ref 70–99)
Potassium: 3.6 mEq/L (ref 3.5–5.1)
Sodium: 139 mEq/L (ref 135–145)
Total Bilirubin: 0.5 mg/dL (ref 0.2–1.2)
Total Protein: 6.9 g/dL (ref 6.0–8.3)

## 2019-04-05 LAB — LIPID PANEL
Cholesterol: 208 mg/dL — ABNORMAL HIGH (ref 0–200)
HDL: 108.2 mg/dL (ref >39.00)
LDL Cholesterol: 80 mg/dL (ref 0–99)
NonHDL: 99.73
Total CHOL/HDL Ratio: 2
Triglycerides: 97 mg/dL (ref 0.0–149.0)
VLDL: 19.4 mg/dL (ref 0.0–40.0)

## 2019-04-05 LAB — HEMOGLOBIN A1C: Hgb A1c MFr Bld: 6.9 % — ABNORMAL HIGH (ref 4.6–6.5)

## 2019-04-13 ENCOUNTER — Other Ambulatory Visit: Payer: Self-pay | Admitting: Nurse Practitioner

## 2019-04-13 ENCOUNTER — Other Ambulatory Visit: Payer: Self-pay | Admitting: Internal Medicine

## 2019-04-15 LAB — HM DIABETES EYE EXAM

## 2019-04-15 NOTE — Telephone Encounter (Signed)
Refill request for Metformin ER.

## 2019-04-16 ENCOUNTER — Other Ambulatory Visit: Payer: Self-pay | Admitting: Internal Medicine

## 2019-04-16 MED ORDER — METFORMIN HCL 500 MG PO TABS: 500.0000 mg | ORAL_TABLET | Freq: Two times a day (BID) | ORAL | 2 refills | Status: DC

## 2019-04-16 NOTE — Progress Notes (Signed)
Metformin dose reduced to 500 mg twice daily.  The xr is no longer recommended due to recall

## 2019-04-22 ENCOUNTER — Other Ambulatory Visit: Payer: Self-pay | Admitting: Internal Medicine

## 2019-04-27 ENCOUNTER — Other Ambulatory Visit: Payer: Self-pay | Admitting: Internal Medicine

## 2019-05-04 ENCOUNTER — Other Ambulatory Visit: Payer: Self-pay | Admitting: Internal Medicine

## 2019-05-04 DIAGNOSIS — G8929 Other chronic pain: Secondary | ICD-10-CM

## 2019-05-04 MED ORDER — TIZANIDINE HCL 4 MG PO TABS: 4.0000 mg | ORAL_TABLET | Freq: Four times a day (QID) | ORAL | 0 refills | Status: DC | PRN

## 2019-05-04 NOTE — Progress Notes (Signed)
Tizanidine has been sent to [facility].  Please do not drive or combine with alcohol.

## 2019-05-04 NOTE — Assessment & Plan Note (Signed)
Tizanidine has been sent to [facility] per request.  Patient reminded to avoid driving  or combine with alcohol.

## 2019-05-23 ENCOUNTER — Ambulatory Visit
Admission: RE | Admit: 2019-05-23 | Discharge: 2019-05-23 | Disposition: A | Discharge status: Home / Self Care | Payer: Medicare Other | Source: Ambulatory Visit | Attending: Radiation Oncology | Admitting: Radiation Oncology

## 2019-05-23 ENCOUNTER — Encounter: Payer: Self-pay | Admitting: Radiation Oncology

## 2019-05-23 ENCOUNTER — Other Ambulatory Visit: Payer: Self-pay

## 2019-05-23 VITALS — BP 132/72 | HR 70 | Temp 98.2°F | Resp 18 | Wt 172.8 lb

## 2019-05-23 DIAGNOSIS — N393 Stress incontinence (female) (male): Secondary | ICD-10-CM | POA: Insufficient documentation

## 2019-05-23 DIAGNOSIS — Z923 Personal history of irradiation: Secondary | ICD-10-CM | POA: Insufficient documentation

## 2019-05-23 DIAGNOSIS — Z90722 Acquired absence of ovaries, bilateral: Secondary | ICD-10-CM | POA: Insufficient documentation

## 2019-05-23 DIAGNOSIS — Z8542 Personal history of malignant neoplasm of other parts of uterus: Secondary | ICD-10-CM | POA: Diagnosis present

## 2019-05-23 DIAGNOSIS — Z90710 Acquired absence of both cervix and uterus: Secondary | ICD-10-CM | POA: Insufficient documentation

## 2019-05-23 DIAGNOSIS — C55 Malignant neoplasm of uterus, part unspecified: Secondary | ICD-10-CM

## 2019-05-23 NOTE — Progress Notes (Signed)
Radiation Oncology Follow up Note  Name: Sandra Hendrix   Date:   05/23/2019 MRN:  [id number] DOB: 03-12-1946    This 73 y.o. female presents to the clinic today for 2-year follow-up status post radiation therapy for stage I well-differentiated endometrial adenocarcinoma status post TAH/BSO with positive cytology.  REFERRING PROVIDER: Crecencio Mc, MD  HPI: Patient is a 73 year old female now out 2 years having completed external beam radiation therapy for stage Ib well differentiated endometrioid adenocarcinoma status post TAH/BSO with positive cytology.  She is seen today in routine follow-up and is doing well specifically denies any vaginal discharge vaginal pain any increased lower urinary tract symptoms or diarrhea..  She is currently on letrozole tolerating that well without side effect.  She does have minor stress incontinence.  COMPLICATIONS OF TREATMENT: none  FOLLOW UP COMPLIANCE: keeps appointments   PHYSICAL EXAM:  BP 132/72   Pulse 70   Temp 98.2 F (36.8 C)   Resp 18   Wt 172 lb 12.8 oz (78.4 kg)   BMI 32.92 kg/m  On speculum examination vaginal vault is clear no vaginal mucosal lesions are identified.  Bimanual examination shows no evidence of parametrial mass no vaginal nodularity.  Well-developed well-nourished patient in NAD. HEENT reveals PERLA, EOMI, discs not visualized.  Oral cavity is clear. No oral mucosal lesions are identified. Neck is clear without evidence of cervical or supraclavicular adenopathy. Lungs are clear to A&P. Cardiac examination is essentially unremarkable with regular rate and rhythm without murmur rub or thrill. Abdomen is benign with no organomegaly or masses noted. Motor sensory and DTR levels are equal and symmetric in the upper and lower extremities. Cranial nerves II through XII are grossly intact. Proprioception is intact. No peripheral adenopathy or edema is identified. No motor or sensory levels are noted. Crude visual fields are  within normal range.  RADIOLOGY RESULTS: No current films for review  PLAN: Present time patient continues to do well with no evidence of disease and a negative exam.  I am pleased with her overall progress.  I have asked to see her back in 1 year for follow-up.  She continues follow-up care with GYN oncology.  Patient is to call with any concerns.  I would like to take this opportunity to thank you for allowing me to participate in the care of your patient.Sandra Filbert, MD

## 2019-06-25 ENCOUNTER — Other Ambulatory Visit: Payer: Self-pay | Admitting: Internal Medicine

## 2019-07-03 ENCOUNTER — Encounter: Payer: Self-pay | Admitting: Internal Medicine

## 2019-07-03 ENCOUNTER — Other Ambulatory Visit (INDEPENDENT_AMBULATORY_CARE_PROVIDER_SITE_OTHER): Payer: Medicare Other

## 2019-07-03 ENCOUNTER — Ambulatory Visit (INDEPENDENT_AMBULATORY_CARE_PROVIDER_SITE_OTHER): Payer: Medicare Other | Admitting: Internal Medicine

## 2019-07-03 ENCOUNTER — Other Ambulatory Visit: Payer: Self-pay

## 2019-07-03 ENCOUNTER — Telehealth: Payer: Self-pay

## 2019-07-03 VITALS — Ht 60.75 in | Wt 172.0 lb

## 2019-07-03 DIAGNOSIS — R072 Precordial pain: Secondary | ICD-10-CM

## 2019-07-03 DIAGNOSIS — R1013 Epigastric pain: Secondary | ICD-10-CM | POA: Insufficient documentation

## 2019-07-03 LAB — COMPREHENSIVE METABOLIC PANEL
ALT: 21 U/L (ref 0–35)
AST: 24 U/L (ref 0–37)
Albumin: 4.7 g/dL (ref 3.5–5.2)
Alkaline Phosphatase: 94 U/L (ref 39–117)
BUN: 23 mg/dL (ref 6–23)
CO2: 25 mEq/L (ref 19–32)
Calcium: 10.2 mg/dL (ref 8.4–10.5)
Chloride: 102 mEq/L (ref 96–112)
Creatinine, Ser: 0.99 mg/dL (ref 0.40–1.20)
GFR: 54.65 mL/min — ABNORMAL LOW (ref >60.00)
Glucose, Bld: 78 mg/dL (ref 70–99)
Potassium: 3.4 mEq/L — ABNORMAL LOW (ref 3.5–5.1)
Sodium: 140 mEq/L (ref 135–145)
Total Bilirubin: 0.4 mg/dL (ref 0.2–1.2)
Total Protein: 7.9 g/dL (ref 6.0–8.3)

## 2019-07-03 LAB — LIPASE: Lipase: 27 U/L (ref 11.0–59.0)

## 2019-07-03 LAB — TROPONIN I (HIGH SENSITIVITY): High Sens Troponin I: 7 ng/L (ref 2–17)

## 2019-07-03 MED ORDER — NITROGLYCERIN 0.4 MG SL SUBL: 0.4000 mg | SUBLINGUAL_TABLET | SUBLINGUAL | 3 refills | Status: DC | PRN

## 2019-07-03 NOTE — Addendum Note (Signed)
Addended by: Leeanne Rio on: 07/03/2019 03:43 PM   Modules accepted: Orders

## 2019-07-03 NOTE — Telephone Encounter (Signed)
error 

## 2019-07-03 NOTE — Assessment & Plan Note (Addendum)
atypical for angina   Bu she has multiple risk factors for occult CAD.  Urgent   Referral to cardiology for workup  SL NTG ordered for next episode.  She is s/p chole but could also have a retained stone.  abd ultrasound, lipase , cardiac enzymes today

## 2019-07-03 NOTE — Progress Notes (Signed)
Virtual Visit via [email or web address]  This visit type was conducted due to national recommendations for restrictions regarding the COVID-19 pandemic (e.g. social distancing).  This format is felt to be most appropriate for this patient at this time.  All issues noted in this document were discussed and addressed.  No physical exam was performed (except for noted visual exam findings with Video Visits).   I connected with@ on 07/03/19 at  9:00 AM EDT by a video enabled telemedicine application and verified that I am speaking with the correct person using two identifiers. Location patient: home Location provider: work or home office Persons participating in the virtual visit: patient, provider  I discussed the limitations, risks, security and privacy concerns of performing an evaluation and management service by telephone and the availability of in person appointments. I also discussed with the patient that there may be a patient responsible charge related to this service. The patient expressed understanding and agreed to proceed.  Reason for visit: epigastric /chest api   HPI:  73 yr old female with Type 2 DM, hypertension, hiatal hernia,  No recent cardiac evaluation presents with recent history of chest pain that occurred at rest and last 6 hours . Occurred one week ago. Did not resolve with Tums,  Was not affected by position.  No nausea,  Change in bowel habits,  Shortness of breath or diaphoresis. States tha tit radiated to back between the shoulder blades.    Called the office and was told she couldn't be seen until today.  Since then the pain has not recurred,  but she has had 2 episodes of pain occurring  at the top of her sternum (at the manubrium) that were not accompanied by dysphagia, jaw pain  Diaphoresis or nausea.  Each episode lasting 5 minutes,  Last one occurred 5 days ago.  Both occurring at rest.     She has a history of sliding hiatal hernia and Barrett's esophagus  Takes Protonix bid.  .       ROS: See pertinent positives and negatives per HPI.  Past Medical History:  Diagnosis Date  . Allergy   . Anxiety   . Arthritis   . Barrett's esophagus determined by endoscopy   . Cancer [facility])    Uterine Cancer  . Chronic kidney disease   . Depression   . Diabetes mellitus without complication ([place])   . Difficult intubation    "not sure what happened,asleep was told had problem with intubation"  . Diverticulosis   . GERD (gastroesophageal reflux disease)   . History of bronchitis   . History of hiatal hernia   . Hyperlipidemia   . Hypertension   . Sleep apnea    Use C- PAP  . Urinary incontinence   . Varicose veins     Past Surgical History:  Procedure Laterality Date  . ABDOMINAL HYSTERECTOMY  03/01/2017  . CHOLECYSTECTOMY    . COLONOSCOPY WITH PROPOFOL N/A 06/14/2016   Procedure: COLONOSCOPY WITH PROPOFOL;  Surgeon: Lollie Sails, MD;  Location: [facility] ENDOSCOPY;  Service: Endoscopy;  Laterality: N/A;  . ESOPHAGOGASTRODUODENOSCOPY (EGD) WITH PROPOFOL N/A 06/14/2016   Procedure: ESOPHAGOGASTRODUODENOSCOPY (EGD) WITH PROPOFOL;  Surgeon: Lollie Sails, MD;  Location: [facility] ENDOSCOPY;  Service: Endoscopy;  Laterality: N/A;  . ESOPHAGOGASTRODUODENOSCOPY (EGD) WITH PROPOFOL N/A 06/12/2017   Procedure: ESOPHAGOGASTRODUODENOSCOPY (EGD) WITH PROPOFOL;  Surgeon: Lollie Sails, MD;  Location: [facility] ENDOSCOPY;  Service: Endoscopy;  Laterality: N/A;  . ESOPHAGOGASTRODUODENOSCOPY (EGD) WITH PROPOFOL N/A 08/25/2017   Procedure:  ESOPHAGOGASTRODUODENOSCOPY (EGD) WITH PROPOFOL;  Surgeon: Lollie Sails, MD;  Location: [facility] ENDOSCOPY;  Service: Endoscopy;  Laterality: N/A;  . HYSTEROSCOPY W/D&C N/A 01/02/2017   Procedure: DILATATION AND CURETTAGE /HYSTEROSCOPY;  Surgeon: Brayton Mars, MD;  Location: [facility] ORS;  Service: Gynecology;  Laterality: N/A;  . JOINT REPLACEMENT Right 2007   Total Knee Replacement  . JOINT REPLACEMENT Left 2004   Total Knee Replacement  .  LAPAROSCOPIC HYSTERECTOMY Bilateral 03/01/2017   Procedure: HYSTERECTOMY TOTAL LAPAROSCOPIC BSO;  Surgeon: Mellody Drown, MD;  Location: [facility] ORS;  Service: Gynecology;  Laterality: Bilateral;  . ROTATOR CUFF REPAIR Right   . SENTINEL NODE BIOPSY N/A 03/01/2017   Procedure: SENTINEL NODE INJECTION AND BIOPSY;  Surgeon: Mellody Drown, MD;  Location: [facility] ORS;  Service: Gynecology;  Laterality: N/A;  . TONSILLECTOMY     as a child  . TUBAL LIGATION    . UPPER GI ENDOSCOPY     x2    Family History  Problem Relation Age of Onset  . Stroke Mother   . Hypertension Mother   . Lupus Mother   . Alcohol abuse Father   . Diabetes Father   . Cancer Father        gallbladder ca into liver   . Arthritis Sister   . Diabetes Sister   . Lumbar disc disease Sister   . Hyperlipidemia Daughter   . Hypertension Daughter   . Cancer Maternal Aunt        ovarian ca  . Diabetes Paternal Aunt   . Drug abuse Paternal Aunt   . Cancer Maternal Grandfather        colon ca  . Hypertension Brother     SOCIAL HX:  reports that she has never smoked. She has never used smokeless tobacco. She reports current alcohol use. She reports that she does not use drugs.   Current Outpatient Medications:  .  ACCU-CHEK AVIVA PLUS test strip, TEST TWO TIMES DAILY, Disp: 200 each, Rfl: 1 .  acetaminophen (TYLENOL) 500 MG tablet, Take 2 tablets (1,000 mg total) by mouth every 6 (six) hours. (Patient taking differently: Take 1,000 mg every 6 (six) hours as needed by mouth. ), Disp: 65 tablet, Rfl: 1 .  albuterol (PROVENTIL HFA;VENTOLIN HFA) 108 (90 BASE) MCG/ACT inhaler, Inhale 2 puffs into the lungs every 6 (six) hours as needed for wheezing or shortness of breath. , Disp: , Rfl:  .  aspirin EC 81 MG tablet, Take 81 mg by mouth every evening. , Disp: , Rfl:  .  atorvastatin (LIPITOR) 40 MG tablet, Take 20 mg by mouth daily., Disp: , Rfl:  .  azelastine (ASTELIN) 0.1 % nasal spray, Place 1 spray into both nostrils  daily., Disp: , Rfl:  .  BESIVANCE 0.6 % SUSP, INSTILL 1 DROP INTO LEFT EYE THREE TIMES DAILY AS DIRECTED, Disp: , Rfl:  .  Biotin 1000 MCG tablet, Take 1,000 mcg by mouth 2 (two) times daily., Disp: , Rfl:  .  Cholecalciferol (D3-1000 PO), Take 1 tablet by mouth daily. , Disp: , Rfl:  .  Cinnamon Bark POWD, Take by mouth., Disp: , Rfl:  .  COLLAGEN PO, Take by mouth 2 (two) times daily. For improved fingernail strength, Disp: , Rfl:  .  cyclobenzaprine (FLEXERIL) 10 MG tablet, Take 10 mg by mouth daily as needed for muscle spasms., Disp: , Rfl:  .  docusate sodium (COLACE) 100 MG capsule, Take 1 capsule (100 mg total) by mouth 2 (two) times daily.,  Disp: 20 capsule, Rfl: 0 .  DUREZOL 0.05 % EMUL, INSTILL 1 DROP INTO LEFT EYE THREE TIMES DAILY AS DIRECTED, Disp: , Rfl:  .  fish oil-omega-3 fatty acids 1000 MG capsule, Take 1 g by mouth every evening. , Disp: , Rfl:  .  fluticasone (FLONASE) 50 MCG/ACT nasal spray, Place 1 spray into both nostrils daily., Disp: 48 g, Rfl: 1 .  furosemide (LASIX) 20 MG tablet, TAKE 1 TABLET BY MOUTH  EVERY OTHER DAY, Disp: 45 tablet, Rfl: 1 .  glipiZIDE (GLUCOTROL XL) 5 MG 24 hr tablet, TAKE 1 TABLET BY MOUTH  DAILY, Disp: 90 tablet, Rfl: 1 .  JANUVIA 100 MG tablet, TAKE 1 TABLET BY MOUTH  DAILY, Disp: 90 tablet, Rfl: 1 .  Lancet Devices (ACCU-CHEK SOFTCLIX) lancets, Use to test blood sugars 1 -2 times daily, Disp: 1 each, Rfl: 3 .  letrozole (FEMARA) 2.5 MG tablet, TAKE ONE TABLET BY MOUTH EVERY DAY, Disp: 90 tablet, Rfl: 2 .  LEVEMIR 100 UNIT/ML injection, INJECT 20 UNITS SUBCUTANEOUSLY AT BEDTIME DISCARD  AFTER  42  DAYS, Disp: 10 mL, Rfl: 0 .  loratadine (CLARITIN) 10 MG tablet, Take 10 mg by mouth daily., Disp: , Rfl:  .  losartan-hydrochlorothiazide (HYZAAR) 50-12.5 MG tablet, TAKE 1 TABLET BY MOUTH  DAILY, Disp: 90 tablet, Rfl: 1 .  Magnesium 250 MG TABS, Take 1 tablet by mouth daily., Disp: , Rfl:  .  meclizine (ANTIVERT) 25 MG tablet, Take 1 tablet (25 mg  total) by mouth 3 (three) times daily as needed for dizziness., Disp: 30 tablet, Rfl: 3 .  metFORMIN (GLUCOPHAGE) 500 MG tablet, Take 1 tablet (500 mg total) by mouth 2 (two) times daily with a meal., Disp: 180 tablet, Rfl: 2 .  Multiple Vitamin (MULTIVITAMIN) tablet, Take 1 tablet by mouth daily., Disp: , Rfl:  .  NON FORMULARY, medroxy progesterone one 10mg  pill once daily, Disp: , Rfl:  .  pantoprazole (PROTONIX) 40 MG tablet, Take 1 tablet by mouth twice daily, Disp: 180 tablet, Rfl: 0 .  Polyethyl Glycol-Propyl Glycol (SYSTANE) 0.4-0.3 % SOLN, Apply to eye., Disp: , Rfl:  .  Potassium 99 MG TABS, Take 1 tablet by mouth daily., Disp: , Rfl:  .  Probiotic Product (PROBIOTIC-10) CAPS, Take 1 capsule by mouth daily. , Disp: , Rfl:  .  PROLENSA 0.07 % SOLN, INSTILL 1 DROP INTO LEFT EYE AT BEDTIME AS DIRECTED, Disp: , Rfl:  .  sertraline (ZOLOFT) 50 MG tablet, TAKE 1 TABLET BY MOUTH  DAILY, Disp: 90 tablet, Rfl: 1 .  verapamil (VERELAN PM) 180 MG 24 hr capsule, TAKE 1 CAPSULE BY MOUTH  DAILY, Disp: 90 capsule, Rfl: 1 .  vitamin B-12 (CYANOCOBALAMIN) 1000 MCG tablet, Take 1,000 mcg by mouth daily., Disp: , Rfl:  .  vitamin C (ASCORBIC ACID) 500 MG tablet, Take 500 mg by mouth daily., Disp: , Rfl:  .  nitroGLYCERIN (NITROSTAT) 0.4 MG SL tablet, Place 1 tablet (0.4 mg total) under the tongue every 5 (five) minutes as needed for chest pain., Disp: 50 tablet, Rfl: 3  EXAM:  VITALS per patient if applicable:  GENERAL: alert, oriented, appears well and in no acute distress  HEENT: atraumatic, conjunttiva clear, no obvious abnormalities on inspection of external nose and ears  NECK: normal movements of the head and neck  LUNGS: on inspection no signs of respiratory distress, breathing rate appears normal, no obvious gross SOB, gasping or wheezing  CV: no obvious cyanosis  MS: moves all  visible extremities without noticeable abnormality  PSYCH/NEURO: pleasant and cooperative, no obvious  depression or anxiety, speech and thought processing grossly intact  ASSESSMENT AND PLAN:  Discussed the following assessment and plan:  Precordial pain - Plan: Lipase, Troponin I, Comprehensive metabolic panel, CK total and CKMB (cardiac)not at [facility], Ambulatory referral to Cardiology  Epigastric pain - Plan: US Abdomen Complete  Precordial pain atypical for angina   Bu she has multiple risk factors for occult CAD.  Urgent   Referral to cardiology for workup  SL NTG ordered for next episode.  She is s/p chole but could also have a retained stone.  abd ultrasound, lipase , cardiac enzymes today     I discussed the assessment and treatment plan with the patient. The patient was provided an opportunity to ask questions and all were answered. The patient agreed with the plan and demonstrated an understanding of the instructions.   The patient was advised to call back or seek an in-person evaluation if the symptoms worsen or if the condition fails to improve as anticipated.  I provided  25 minutes of non-face-to-face time during this encounter reviewing patient's current problems , prior surgeries.  Providing counseling on the above mentioned problems , and coordination  of care .  Crecencio Mc, MD

## 2019-07-04 ENCOUNTER — Ambulatory Visit (INDEPENDENT_AMBULATORY_CARE_PROVIDER_SITE_OTHER): Payer: Medicare Other | Admitting: Cardiovascular Disease

## 2019-07-04 ENCOUNTER — Encounter: Payer: Self-pay | Admitting: Cardiovascular Disease

## 2019-07-04 VITALS — BP 110/60 | HR 67 | Temp 96.8°F | Ht 61.0 in | Wt 179.2 lb

## 2019-07-04 DIAGNOSIS — E785 Hyperlipidemia, unspecified: Secondary | ICD-10-CM | POA: Diagnosis not present

## 2019-07-04 DIAGNOSIS — R072 Precordial pain: Secondary | ICD-10-CM

## 2019-07-04 DIAGNOSIS — I10 Essential (primary) hypertension: Secondary | ICD-10-CM | POA: Diagnosis not present

## 2019-07-04 LAB — CK TOTAL AND CKMB (NOT AT ARMC)
CK, MB: 1.9 ng/mL (ref 0–5.0)
Relative Index: 1.7 (ref 0–4.0)
Total CK: 114 U/L (ref 29–143)

## 2019-07-04 NOTE — Patient Instructions (Signed)
Medication Instructions:  Your physician recommends that you continue on your current medications as directed. Please refer to the Current Medication list given to you today.  If you need a refill on your cardiac medications before your next appointment, please call your pharmacy.   Lab work: None ordered If you have labs (blood work) drawn today and your tests are completely normal, you will receive your results only by: Marland Kitchen MyChart Message (if you have MyChart) OR . A paper copy in the mail If you have any lab test that is abnormal or we need to change your treatment, we will call you to review the results.  Testing/Procedures: Your physician has requested that you have a lexiscan myoview. For further information please visit [email or web address]. Please follow instruction sheet, as given.    Follow-Up: At [facility], you and your health needs are our priority.  As part of our continuing mission to provide you with exceptional heart care, we have created designated Provider Care Teams.  These Care Teams include your primary Cardiologist (physician) and Advanced Practice Providers (APPs -  Physician Assistants and Nurse Practitioners) who all work together to provide you with the care you need, when you need it. You will need a follow up appointment as needed .  You may see  Dr. Fletcher Anon or one of the following Advanced Practice Providers on your designated Care Team:   Murray Hodgkins, NP Christell Faith, PA-C . Marrianne Mood, PA-C  Any Other Special Instructions Will Be Listed Below (If Applicable). [employer]  Your caregiver has ordered a Stress Test with nuclear imaging. The purpose of this test is to evaluate the blood supply to your heart muscle. This procedure is referred to as a "Non-Invasive Stress Test." This is because other than having an IV started in your vein, nothing is inserted or "invades" your body. Cardiac stress tests are done to find areas of poor blood flow to the  heart by determining the extent of coronary artery disease (CAD). Some patients exercise on a treadmill, which naturally increases the blood flow to your heart, while others who are  unable to walk on a treadmill due to physical limitations have a pharmacologic/chemical stress agent called Lexiscan . This medicine will mimic walking on a treadmill by temporarily increasing your coronary blood flow.   Please note: these test may take anywhere between 2-4 hours to complete  PLEASE REPORT TO [place] AT THE FIRST DESK WILL DIRECT YOU WHERE TO GO  Date of Procedure:_____________________________________  Arrival Time for Procedure:______________________________  Instructions regarding medication:   __X__ : Hold diabetes medication morning of procedure  ___X____:  Hold other medications as follows:_HOLD___Furosemide the morning of _____________________________________________________________________________________________________________________________________________________________________________________________________________________________________________________________________________________  PLEASE NOTIFY THE OFFICE AT LEAST 24 HOURS IN ADVANCE IF YOU ARE UNABLE TO KEEP YOUR APPOINTMENT.  [phone] AND  PLEASE NOTIFY NUCLEAR MEDICINE AT [facility] AT LEAST 24 HOURS IN ADVANCE IF YOU ARE UNABLE TO KEEP YOUR APPOINTMENT. [phone]  How to prepare for your Myoview test:  1. Do not eat or drink after midnight 2. No caffeine for 24 hours prior to test 3. No smoking 24 hours prior to test. 4. Your medication may be taken with water.  If your doctor stopped a medication because of this test, do not take that medication. 5. Ladies, please do not wear dresses.  Skirts or pants are appropriate. Please wear a short sleeve shirt. 6. No perfume, cologne or lotion. 7. Wear comfortable walking shoes. No heels!

## 2019-07-04 NOTE — Progress Notes (Signed)
Cardiology Office Note   Date:  07/04/2019   ID:  Sandra Hendrix, DOB 12/20/1945, MRN [id number]  PCP:  Crecencio Mc, MD  Cardiologist:   Kathlyn Sacramento, MD   Chief Complaint  Patient presents with  . other    Ref by Dr. Derrel Nip for chest pain. Former Dr. Verl Blalock patient 10 + years ago. Meds reviewed by the pt. verbally. Pt. c/o chest discomfort/twinges at times.       History of Present Illness: Sandra Hendrix is a 73 y.o. female who was referred by Dr. Derrel Nip for evaluation of chest pain. She has known history of type 2 diabetes, mild chronic kidney disease, previous endometrioid adenocarcinoma, hyperlipidemia, essential hypertension and hiatal hernia. She reports being seen by Dr. Verl Blalock more than 15 years ago and had a stress test at that time that was normal.  She had an episode of chest pain last week.  It was in the epigastric area and felt like heartburn with some tightness in her throat.  The episode lasted for several hours and did not respond to antacids.  She does have known history of GERD.  She has not had any further episodes since then and she feels back to baseline.  She does describe mild exertional dyspnea.  She is not a smoker and has no family history of coronary artery disease.  Labs were done yesterday showed normal troponin level, normal lipase, unremarkable CMP except for hypokalemia at 3.4 and normal CPK.  Past Medical History:  Diagnosis Date  . Allergy   . Anxiety   . Arthritis   . Barrett's esophagus determined by endoscopy   . Cancer [facility])    Uterine Cancer  . Chronic kidney disease   . Depression   . Diabetes mellitus without complication ([place])   . Difficult intubation    "not sure what happened,asleep was told had problem with intubation"  . Diverticulosis   . GERD (gastroesophageal reflux disease)   . History of bronchitis   . History of hiatal hernia   . Hyperlipidemia   . Hypertension   . Sleep apnea    Use C- PAP  . Urinary  incontinence   . Varicose veins     Past Surgical History:  Procedure Laterality Date  . ABDOMINAL HYSTERECTOMY  03/01/2017  . CARDIAC CATHETERIZATION    . CHOLECYSTECTOMY    . COLONOSCOPY WITH PROPOFOL N/A 06/14/2016   Procedure: COLONOSCOPY WITH PROPOFOL;  Surgeon: Lollie Sails, MD;  Location: [facility] ENDOSCOPY;  Service: Endoscopy;  Laterality: N/A;  . ESOPHAGOGASTRODUODENOSCOPY (EGD) WITH PROPOFOL N/A 06/14/2016   Procedure: ESOPHAGOGASTRODUODENOSCOPY (EGD) WITH PROPOFOL;  Surgeon: Lollie Sails, MD;  Location: [facility] ENDOSCOPY;  Service: Endoscopy;  Laterality: N/A;  . ESOPHAGOGASTRODUODENOSCOPY (EGD) WITH PROPOFOL N/A 06/12/2017   Procedure: ESOPHAGOGASTRODUODENOSCOPY (EGD) WITH PROPOFOL;  Surgeon: Lollie Sails, MD;  Location: [facility] ENDOSCOPY;  Service: Endoscopy;  Laterality: N/A;  . ESOPHAGOGASTRODUODENOSCOPY (EGD) WITH PROPOFOL N/A 08/25/2017   Procedure: ESOPHAGOGASTRODUODENOSCOPY (EGD) WITH PROPOFOL;  Surgeon: Lollie Sails, MD;  Location: [facility] ENDOSCOPY;  Service: Endoscopy;  Laterality: N/A;  . HYSTEROSCOPY W/D&C N/A 01/02/2017   Procedure: DILATATION AND CURETTAGE /HYSTEROSCOPY;  Surgeon: Brayton Mars, MD;  Location: [facility] ORS;  Service: Gynecology;  Laterality: N/A;  . JOINT REPLACEMENT Right 2007   Total Knee Replacement  . JOINT REPLACEMENT Left 2004   Total Knee Replacement  . LAPAROSCOPIC HYSTERECTOMY Bilateral 03/01/2017   Procedure: HYSTERECTOMY TOTAL LAPAROSCOPIC BSO;  Surgeon: Mellody Drown, MD;  Location:  [facility] ORS;  Service: Gynecology;  Laterality: Bilateral;  . ROTATOR CUFF REPAIR Right   . SENTINEL NODE BIOPSY N/A 03/01/2017   Procedure: SENTINEL NODE INJECTION AND BIOPSY;  Surgeon: Mellody Drown, MD;  Location: [facility] ORS;  Service: Gynecology;  Laterality: N/A;  . TONSILLECTOMY     as a child  . TUBAL LIGATION    . UPPER GI ENDOSCOPY     x2     Current Outpatient Medications  Medication Sig Dispense Refill  . ACCU-CHEK AVIVA PLUS test  strip TEST TWO TIMES DAILY 200 each 1  . acetaminophen (TYLENOL) 500 MG tablet Take 2 tablets (1,000 mg total) by mouth every 6 (six) hours. (Patient taking differently: Take 1,000 mg every 6 (six) hours as needed by mouth. ) 65 tablet 1  . albuterol (PROVENTIL HFA;VENTOLIN HFA) 108 (90 BASE) MCG/ACT inhaler Inhale 2 puffs into the lungs every 6 (six) hours as needed for wheezing or shortness of breath.     Marland Kitchen aspirin EC 81 MG tablet Take 81 mg by mouth every evening.     Marland Kitchen atorvastatin (LIPITOR) 40 MG tablet Take 20 mg by mouth daily.    Marland Kitchen azelastine (ASTELIN) 0.1 % nasal spray Place 1 spray into both nostrils daily.    Marland Kitchen BESIVANCE 0.6 % SUSP INSTILL 1 DROP INTO LEFT EYE THREE TIMES DAILY AS DIRECTED    . Biotin 1000 MCG tablet Take 1,000 mcg by mouth 2 (two) times daily.    . Cholecalciferol (D3-1000 PO) Take 1 tablet by mouth daily.     Verneita Griffes Bark POWD Take by mouth.    . COLLAGEN PO Take by mouth 2 (two) times daily. For improved fingernail strength    . cyclobenzaprine (FLEXERIL) 10 MG tablet Take 10 mg by mouth daily as needed for muscle spasms.    Marland Kitchen docusate sodium (COLACE) 100 MG capsule Take 1 capsule (100 mg total) by mouth 2 (two) times daily. 20 capsule 0  . DUREZOL 0.05 % EMUL INSTILL 1 DROP INTO LEFT EYE THREE TIMES DAILY AS DIRECTED    . fish oil-omega-3 fatty acids 1000 MG capsule Take 1 g by mouth every evening.     . fluticasone (FLONASE) 50 MCG/ACT nasal spray Place 1 spray into both nostrils daily. 48 g 1  . furosemide (LASIX) 20 MG tablet TAKE 1 TABLET BY MOUTH  EVERY OTHER DAY 45 tablet 1  . glipiZIDE (GLUCOTROL XL) 5 MG 24 hr tablet TAKE 1 TABLET BY MOUTH  DAILY 90 tablet 1  . JANUVIA 100 MG tablet TAKE 1 TABLET BY MOUTH  DAILY 90 tablet 1  . Lancet Devices (ACCU-CHEK SOFTCLIX) lancets Use to test blood sugars 1 -2 times daily 1 each 3  . letrozole (FEMARA) 2.5 MG tablet TAKE ONE TABLET BY MOUTH EVERY DAY 90 tablet 2  . LEVEMIR 100 UNIT/ML injection INJECT 20 UNITS  SUBCUTANEOUSLY AT BEDTIME DISCARD  AFTER  42  DAYS 10 mL 0  . loratadine (CLARITIN) 10 MG tablet Take 10 mg by mouth daily.    Marland Kitchen losartan-hydrochlorothiazide (HYZAAR) 50-12.5 MG tablet TAKE 1 TABLET BY MOUTH  DAILY 90 tablet 1  . Magnesium 250 MG TABS Take 1 tablet by mouth daily.    . meclizine (ANTIVERT) 25 MG tablet Take 1 tablet (25 mg total) by mouth 3 (three) times daily as needed for dizziness. 30 tablet 3  . metFORMIN (GLUCOPHAGE) 500 MG tablet Take 1 tablet (500 mg total) by mouth 2 (two) times daily with a meal.  180 tablet 2  . Multiple Vitamin (MULTIVITAMIN) tablet Take 1 tablet by mouth daily.    . nitroGLYCERIN (NITROSTAT) 0.4 MG SL tablet Place 1 tablet (0.4 mg total) under the tongue every 5 (five) minutes as needed for chest pain. 50 tablet 3  . NON FORMULARY medroxy progesterone one 10mg  pill once daily    . pantoprazole (PROTONIX) 40 MG tablet Take 1 tablet by mouth twice daily 180 tablet 0  . Polyethyl Glycol-Propyl Glycol (SYSTANE) 0.4-0.3 % SOLN Apply to eye.    Marland Kitchen Potassium 99 MG TABS Take 1 tablet by mouth daily.    . Probiotic Product (PROBIOTIC-10) CAPS Take 1 capsule by mouth daily.     Marland Kitchen PROLENSA 0.07 % SOLN INSTILL 1 DROP INTO LEFT EYE AT BEDTIME AS DIRECTED    . sertraline (ZOLOFT) 50 MG tablet TAKE 1 TABLET BY MOUTH  DAILY 90 tablet 1  . verapamil (VERELAN PM) 180 MG 24 hr capsule TAKE 1 CAPSULE BY MOUTH  DAILY 90 capsule 1  . vitamin B-12 (CYANOCOBALAMIN) 1000 MCG tablet Take 1,000 mcg by mouth daily.    . vitamin C (ASCORBIC ACID) 500 MG tablet Take 500 mg by mouth daily.     No current facility-administered medications for this visit.     Allergies:   Clindamycin/lincomycin, Codeine, Lincomycin, Morphine and related, Talwin [pentazocine], Lisinopril, Naloxone, and Oxycodone-acetaminophen    Social History:  The patient  reports that she has never smoked. She has never used smokeless tobacco. She reports current alcohol use. She reports that she does not use  drugs.   Family History:  The patient's family history includes Alcohol abuse in her father; Arthritis in her sister; Cancer in her father, maternal aunt, and maternal grandfather; Diabetes in her father, paternal aunt, and sister; Drug abuse in her paternal aunt; Hyperlipidemia in her daughter; Hypertension in her brother, daughter, and mother; Lumbar disc disease in her sister; Lupus in her mother; Stroke in her mother.    ROS:  Please see the history of present illness.   Otherwise, review of systems are positive for none.   All other systems are reviewed and negative.    PHYSICAL EXAM: VS:  BP 110/60 (BP Location: Right Arm, Patient Position: Sitting, Cuff Size: Normal)   Pulse 67   Temp (!) 96.8 F (36 C)   Ht 5\' 1"  (1.549 m)   Wt 179 lb 4 oz (81.3 kg)   BMI 33.87 kg/m  , BMI Body mass index is 33.87 kg/m. GEN: Well nourished, well developed, in no acute distress  HEENT: normal  Neck: no JVD, carotid bruits, or masses Cardiac: RRR; no murmurs, rubs, or gallops,no edema  Respiratory:  clear to auscultation bilaterally, normal work of breathing GI: soft, nontender, nondistended, + BS MS: no deformity or atrophy  Skin: warm and dry, no rash Neuro:  Strength and sensation are intact Psych: euthymic mood, full affect   EKG:  EKG is ordered today. The ekg ordered today demonstrates normal sinus rhythm with no significant ST or T wave changes.  Low voltage with no evidence of Q waves.   Recent Labs: 09/27/2018: TSH 2.78 07/03/2019: ALT 21; BUN 23; Creatinine, Ser 0.99; Potassium 3.4; Sodium 140    Lipid Panel    Component Value Date/Time   CHOL 208 (H) 04/05/2019 0832   TRIG 97.0 04/05/2019 0832   HDL 108.20 04/05/2019 0832   CHOLHDL 2 04/05/2019 0832   VLDL 19.4 04/05/2019 0832   LDLCALC 80 04/05/2019 [id number]  LDLDIRECT 80.0 02/22/2017 0911      Wt Readings from Last 3 Encounters:  07/04/19 179 lb 4 oz (81.3 kg)  07/03/19 172 lb (78 kg)  05/23/19 172 lb 12.8 oz (78.4  kg)       PAD Screen 07/04/2019  Previous PAD dx? No  Previous surgical procedure? No  Pain with walking? No  Feet/toe relief with dangling? No  Painful, non-healing ulcers? No  Extremities discolored? No      ASSESSMENT AND PLAN:  1.  Atypical chest pain: Her symptoms are suggestive of GI etiology.  Cardiac exam is unremarkable baseline EKG is relatively normal other than low voltage.  She does have multiple risk factors for coronary artery disease and thus I recommend evaluation with a Lexiscan Myoview.  2.  Essential hypertension: Blood pressure is controlled on current medications.  3.  Hyperlipidemia: Currently on atorvastatin 40 mg once daily given that she is diabetic.  Most recent LDL was 80.    Disposition:   FU with me as needed.  Signed,  Kathlyn Sacramento, MD  07/04/2019 4:01 PM    [facility]

## 2019-07-08 ENCOUNTER — Other Ambulatory Visit: Payer: Self-pay | Admitting: Internal Medicine

## 2019-07-18 ENCOUNTER — Other Ambulatory Visit: Payer: Self-pay | Admitting: Internal Medicine

## 2019-07-18 ENCOUNTER — Ambulatory Visit
Admission: RE | Admit: 2019-07-18 | Discharge: 2019-07-18 | Disposition: A | Discharge status: Home / Self Care | Payer: Medicare Other | Source: Ambulatory Visit | Attending: Internal Medicine | Admitting: Internal Medicine

## 2019-07-18 ENCOUNTER — Other Ambulatory Visit: Payer: Self-pay

## 2019-07-18 DIAGNOSIS — Z794 Long term (current) use of insulin: Secondary | ICD-10-CM

## 2019-07-18 DIAGNOSIS — K760 Fatty (change of) liver, not elsewhere classified: Secondary | ICD-10-CM | POA: Insufficient documentation

## 2019-07-18 DIAGNOSIS — R1013 Epigastric pain: Secondary | ICD-10-CM | POA: Insufficient documentation

## 2019-07-18 DIAGNOSIS — E1121 Type 2 diabetes mellitus with diabetic nephropathy: Secondary | ICD-10-CM

## 2019-07-18 DIAGNOSIS — I150 Renovascular hypertension: Secondary | ICD-10-CM

## 2019-07-18 DIAGNOSIS — I701 Atherosclerosis of renal artery: Secondary | ICD-10-CM

## 2019-07-29 ENCOUNTER — Encounter
Admission: RE | Admit: 2019-07-29 | Discharge: 2019-07-29 | Disposition: A | Discharge status: Home / Self Care | Payer: Medicare Other | Source: Ambulatory Visit | Attending: Cardiovascular Disease | Admitting: Cardiovascular Disease

## 2019-07-29 ENCOUNTER — Other Ambulatory Visit: Payer: Self-pay

## 2019-07-29 DIAGNOSIS — R072 Precordial pain: Secondary | ICD-10-CM | POA: Diagnosis present

## 2019-07-29 LAB — NM MYOCAR MULTI W/SPECT W/WALL MOTION / EF
Estimated workload: 1 METS
Exercise duration (min): 0 min
Exercise duration (sec): 0 s
LV dias vol: 79 mL (ref 46–106)
LV sys vol: 25 mL
MPHR: 145 {beats}/min
Peak HR: 84 {beats}/min
Percent HR: 57 %
Rest HR: 58 {beats}/min
SDS: 0
SRS: 0
SSS: 0
TID: 1

## 2019-07-29 MED ORDER — TECHNETIUM TC 99M TETROFOSMIN IV KIT
28.1830 | PACK | Freq: Once | INTRAVENOUS | Status: AC | PRN
  Administered 2019-07-29: 28.183 via INTRAVENOUS

## 2019-07-29 MED ORDER — REGADENOSON 0.4 MG/5ML IV SOLN
0.4000 mg | Freq: Once | INTRAVENOUS | Status: AC
  Administered 2019-07-29: 0.4 mg via INTRAVENOUS

## 2019-07-29 MED ORDER — TECHNETIUM TC 99M TETROFOSMIN IV KIT
10.4300 | PACK | Freq: Once | INTRAVENOUS | Status: AC | PRN
  Administered 2019-07-29: 10.43 via INTRAVENOUS

## 2019-08-07 ENCOUNTER — Telehealth: Payer: Self-pay

## 2019-08-07 ENCOUNTER — Encounter: Payer: Self-pay | Admitting: Internal Medicine

## 2019-08-07 ENCOUNTER — Other Ambulatory Visit: Payer: Self-pay

## 2019-08-07 ENCOUNTER — Ambulatory Visit (INDEPENDENT_AMBULATORY_CARE_PROVIDER_SITE_OTHER): Payer: Medicare Other | Admitting: Internal Medicine

## 2019-08-07 DIAGNOSIS — K760 Fatty (change of) liver, not elsewhere classified: Secondary | ICD-10-CM | POA: Diagnosis not present

## 2019-08-07 DIAGNOSIS — E1169 Type 2 diabetes mellitus with other specified complication: Secondary | ICD-10-CM

## 2019-08-07 DIAGNOSIS — E1121 Type 2 diabetes mellitus with diabetic nephropathy: Secondary | ICD-10-CM

## 2019-08-07 DIAGNOSIS — E785 Hyperlipidemia, unspecified: Secondary | ICD-10-CM

## 2019-08-07 DIAGNOSIS — I10 Essential (primary) hypertension: Secondary | ICD-10-CM

## 2019-08-07 DIAGNOSIS — C55 Malignant neoplasm of uterus, part unspecified: Secondary | ICD-10-CM

## 2019-08-07 DIAGNOSIS — N1831 Chronic kidney disease, stage 3a: Secondary | ICD-10-CM

## 2019-08-07 DIAGNOSIS — Z794 Long term (current) use of insulin: Secondary | ICD-10-CM

## 2019-08-07 NOTE — Telephone Encounter (Signed)
Please schedule patient for [place] to come in and have hep A/B vaccine

## 2019-08-07 NOTE — Telephone Encounter (Signed)
Copied from [place] [id number]. Topic: General - Other >> Aug 07, 2019  1:31 PM Rayann Heman wrote: Reason for CRM: pt called and stated that she would like a call back from the nurse regarding getting hepatitis a&b shots. Pt states that insurance will cover both. Please advise

## 2019-08-07 NOTE — Telephone Encounter (Signed)
Called an spoke w/ patient to give advisement from Dr. Derrel Nip regarding discontinuation of progesterone.  Per Dr. Derrel Nip, pt was told that Dr. Theora Gianotti stopped the progesterone when pt started the letrozole.  Pt aware and voiced understanding and had no questions or concerns.  Per Dr. Lupita Dawn request, pt was scheduled non fasting labs in mid-January.  Pt scheduled for labs on 10/11/19 @ 2:00 pm.

## 2019-08-07 NOTE — Assessment & Plan Note (Signed)
LDL and triglycerides are at goal on current medications. She has no side effects and liver enzymes are normal. No changes today.  Lab Results  Component Value Date   CHOL 208 (H) 04/05/2019   HDL 108.20 04/05/2019   LDLCALC 80 04/05/2019   LDLDIRECT 80.0 02/22/2017   TRIG 97.0 04/05/2019   CHOLHDL 2 04/05/2019   Lab Results  Component Value Date   ALT 21 07/03/2019   AST 24 07/03/2019   ALKPHOS 94 07/03/2019   BILITOT 0.4 07/03/2019

## 2019-08-07 NOTE — Assessment & Plan Note (Addendum)
Currently taking Letrozole .  Porvera was prescribed by [facility] for peritoneal washings per Dr Theora Gianotti and was stopped when Letrozole was started

## 2019-08-07 NOTE — Assessment & Plan Note (Signed)
Renal artery ultrasound was ordered on Oct 22 but not done yet.

## 2019-08-07 NOTE — Progress Notes (Signed)
Virtual Visit via [email or web address]  This visit type was conducted due to national recommendations for restrictions regarding the COVID-19 pandemic (e.g. social distancing).  This format is felt to be most appropriate for this patient at this time.  All issues noted in this document were discussed and addressed.  No physical exam was performed (except for noted visual exam findings with Video Visits).   I connected with@ on 08/07/19 at  9:30 AM EST by a video enabled telemedicine application or telephone and verified that I am speaking with the correct person using two identifiers. Location patient: home Location provider: work or home office Persons participating in the virtual visit: patient, provider  I discussed the limitations, risks, security and privacy concerns of performing an evaluation and management service by telephone and the availability of in person appointments. I also discussed with the patient that there may be a patient responsible charge related to this service. The patient expressed understanding and agreed to proceed.  Reason for visit: follow up on DM  2) follow up on recent abd ultrasound  HPI:  73 yr old female with T2DM, endometrial CA diagnosed in 2018, s/p  TLH/BSO / XRT  On Letrozole recently evaluated with abd ultrasound for evaluation of epigastric pain   Hepatic steatosis:  Suggested by [place].  Discussed diagnosis with patient,  etiologies and natural histroy.  RFs include type 2 DM, obesity (Body mass index is 33.25 kg/m.  No alcohol use.  No prior vaccination for Hep a and b.  Small right kidney:  RAS raised as possible etiology  RA [place] ordered Oct 22  Patient has not been contacted yet  Endometrial CA:  Patient noticed that she has not been taking medroxypogesterone,  Not sure when she stopped.  Still taking letrozole.  Reviewed Dr Gershon Crane last OV note from Dec 2019 no mention of medication.   Type 2 DM:  She  feels generally well,  was exercising several times per week  until the VOID 19 pandemic closed the gym, and has been sedentary for the last 6 monthsand checking blood sugars once daily at variable times.  BS have been under 130 fasting and < 150 post prandially.  Has  an occasional hypoglyemic events caused by fasting. .  Taking her medications as directed. Following a carbohydrate modified diet 6 days per week. Denies numbness, burning and tingling of extremities. Appetite is good.     ROS: See pertinent positives and negatives per HPI.  Past Medical History:  Diagnosis Date  . Allergy   . Anxiety   . Arthritis   . Barrett's esophagus determined by endoscopy   . Cancer [facility])    Uterine Cancer  . Chronic kidney disease   . Depression   . Diabetes mellitus without complication ([place])   . Difficult intubation    "not sure what happened,asleep was told had problem with intubation"  . Diverticulosis   . GERD (gastroesophageal reflux disease)   . History of bronchitis   . History of hiatal hernia   . Hyperlipidemia   . Hypertension   . Sleep apnea    Use C- PAP  . Urinary incontinence   . Varicose veins     Past Surgical History:  Procedure Laterality Date  . ABDOMINAL HYSTERECTOMY  03/01/2017  . CARDIAC CATHETERIZATION    . CHOLECYSTECTOMY    . COLONOSCOPY WITH PROPOFOL N/A 06/14/2016   Procedure: COLONOSCOPY WITH PROPOFOL;  Surgeon: Lollie Sails, MD;  Location: [facility] ENDOSCOPY;  Service: Endoscopy;  Laterality:  N/A;  . ESOPHAGOGASTRODUODENOSCOPY (EGD) WITH PROPOFOL N/A 06/14/2016   Procedure: ESOPHAGOGASTRODUODENOSCOPY (EGD) WITH PROPOFOL;  Surgeon: Lollie Sails, MD;  Location: [facility] ENDOSCOPY;  Service: Endoscopy;  Laterality: N/A;  . ESOPHAGOGASTRODUODENOSCOPY (EGD) WITH PROPOFOL N/A 06/12/2017   Procedure: ESOPHAGOGASTRODUODENOSCOPY (EGD) WITH PROPOFOL;  Surgeon: Lollie Sails, MD;  Location: [facility] ENDOSCOPY;  Service: Endoscopy;  Laterality: N/A;  . ESOPHAGOGASTRODUODENOSCOPY (EGD) WITH PROPOFOL N/A 08/25/2017   Procedure:  ESOPHAGOGASTRODUODENOSCOPY (EGD) WITH PROPOFOL;  Surgeon: Lollie Sails, MD;  Location: [facility] ENDOSCOPY;  Service: Endoscopy;  Laterality: N/A;  . HYSTEROSCOPY W/D&C N/A 01/02/2017   Procedure: DILATATION AND CURETTAGE /HYSTEROSCOPY;  Surgeon: Brayton Mars, MD;  Location: [facility] ORS;  Service: Gynecology;  Laterality: N/A;  . JOINT REPLACEMENT Right 2007   Total Knee Replacement  . JOINT REPLACEMENT Left 2004   Total Knee Replacement  . LAPAROSCOPIC HYSTERECTOMY Bilateral 03/01/2017   Procedure: HYSTERECTOMY TOTAL LAPAROSCOPIC BSO;  Surgeon: Mellody Drown, MD;  Location: [facility] ORS;  Service: Gynecology;  Laterality: Bilateral;  . ROTATOR CUFF REPAIR Right   . SENTINEL NODE BIOPSY N/A 03/01/2017   Procedure: SENTINEL NODE INJECTION AND BIOPSY;  Surgeon: Mellody Drown, MD;  Location: [facility] ORS;  Service: Gynecology;  Laterality: N/A;  . TONSILLECTOMY     as a child  . TUBAL LIGATION    . UPPER GI ENDOSCOPY     x2    Family History  Problem Relation Age of Onset  . Stroke Mother   . Hypertension Mother   . Lupus Mother   . Alcohol abuse Father   . Diabetes Father   . Cancer Father        gallbladder ca into liver   . Arthritis Sister   . Diabetes Sister   . Lumbar disc disease Sister   . Hyperlipidemia Daughter   . Hypertension Daughter   . Cancer Maternal Aunt        ovarian ca  . Diabetes Paternal Aunt   . Drug abuse Paternal Aunt   . Cancer Maternal Grandfather        colon ca  . Hypertension Brother     SOCIAL HX:  reports that she has never smoked. She has never used smokeless tobacco. She reports current alcohol use. She reports that she does not use drugs.   Current Outpatient Medications:  .  ACCU-CHEK AVIVA PLUS test strip, TEST TWO TIMES DAILY, Disp: 200 each, Rfl: 1 .  acetaminophen (TYLENOL) 500 MG tablet, Take 2 tablets (1,000 mg total) by mouth every 6 (six) hours. (Patient taking differently: Take 1,000 mg every 6 (six) hours as needed by mouth. ),  Disp: 65 tablet, Rfl: 1 .  albuterol (PROVENTIL HFA;VENTOLIN HFA) 108 (90 BASE) MCG/ACT inhaler, Inhale 2 puffs into the lungs every 6 (six) hours as needed for wheezing or shortness of breath. , Disp: , Rfl:  .  aspirin EC 81 MG tablet, Take 81 mg by mouth every evening. , Disp: , Rfl:  .  atorvastatin (LIPITOR) 40 MG tablet, Take 20 mg by mouth daily., Disp: , Rfl:  .  azelastine (ASTELIN) 0.1 % nasal spray, Place 1 spray into both nostrils daily., Disp: , Rfl:  .  Biotin 5000 MCG TABS, Take by mouth 2 (two) times daily. , Disp: , Rfl:  .  Cholecalciferol (D3-1000 PO), Take 1 tablet by mouth daily. , Disp: , Rfl:  .  Cinnamon Bark POWD, Take by mouth., Disp: , Rfl:  .  COLLAGEN PO, Take by mouth 2 (two)  times daily. For improved fingernail strength, Disp: , Rfl:  .  cyclobenzaprine (FLEXERIL) 10 MG tablet, Take 10 mg by mouth daily as needed for muscle spasms., Disp: , Rfl:  .  docusate sodium (COLACE) 100 MG capsule, Take 1 capsule (100 mg total) by mouth 2 (two) times daily., Disp: 20 capsule, Rfl: 0 .  fish oil-omega-3 fatty acids 1000 MG capsule, Take 1 g by mouth every evening. , Disp: , Rfl:  .  fluticasone (FLONASE) 50 MCG/ACT nasal spray, Place 1 spray into both nostrils daily., Disp: 48 g, Rfl: 1 .  furosemide (LASIX) 20 MG tablet, TAKE 1 TABLET BY MOUTH  EVERY OTHER DAY, Disp: 45 tablet, Rfl: 1 .  glipiZIDE (GLUCOTROL XL) 5 MG 24 hr tablet, TAKE 1 TABLET BY MOUTH  DAILY, Disp: 90 tablet, Rfl: 1 .  JANUVIA 100 MG tablet, TAKE 1 TABLET BY MOUTH  DAILY, Disp: 90 tablet, Rfl: 1 .  Lancet Devices (ACCU-CHEK SOFTCLIX) lancets, Use to test blood sugars 1 -2 times daily, Disp: 1 each, Rfl: 3 .  letrozole (FEMARA) 2.5 MG tablet, TAKE ONE TABLET BY MOUTH EVERY DAY, Disp: 90 tablet, Rfl: 2 .  LEVEMIR 100 UNIT/ML injection, INJECT 20 UNITS SUBCUTANEOUSLY AT BEDTIME DISCARD  AFTER  42  DAYS, Disp: 10 mL, Rfl: 0 .  loratadine (CLARITIN) 10 MG tablet, Take 10 mg by mouth daily., Disp: , Rfl:  .   losartan-hydrochlorothiazide (HYZAAR) 50-12.5 MG tablet, TAKE 1 TABLET BY MOUTH  DAILY, Disp: 90 tablet, Rfl: 3 .  Magnesium 250 MG TABS, Take 1 tablet by mouth daily., Disp: , Rfl:  .  meclizine (ANTIVERT) 25 MG tablet, Take 1 tablet (25 mg total) by mouth 3 (three) times daily as needed for dizziness., Disp: 30 tablet, Rfl: 3 .  metFORMIN (GLUCOPHAGE) 500 MG tablet, Take 1 tablet (500 mg total) by mouth 2 (two) times daily with a meal., Disp: 180 tablet, Rfl: 2 .  Multiple Vitamin (MULTIVITAMIN) tablet, Take 1 tablet by mouth daily., Disp: , Rfl:  .  nitroGLYCERIN (NITROSTAT) 0.4 MG SL tablet, Place 1 tablet (0.4 mg total) under the tongue every 5 (five) minutes as needed for chest pain., Disp: 50 tablet, Rfl: 3 .  NON FORMULARY, medroxy progesterone one 10mg  pill once daily, Disp: , Rfl:  .  pantoprazole (PROTONIX) 40 MG tablet, Take 1 tablet by mouth twice daily, Disp: 180 tablet, Rfl: 0 .  Potassium 99 MG TABS, Take 1 tablet by mouth daily., Disp: , Rfl:  .  Probiotic Product (PROBIOTIC-10) CAPS, Take 1 capsule by mouth daily. , Disp: , Rfl:  .  sertraline (ZOLOFT) 50 MG tablet, TAKE 1 TABLET BY MOUTH  DAILY, Disp: 90 tablet, Rfl: 1 .  verapamil (VERELAN PM) 180 MG 24 hr capsule, TAKE 1 CAPSULE BY MOUTH  DAILY, Disp: 90 capsule, Rfl: 1 .  vitamin B-12 (CYANOCOBALAMIN) 1000 MCG tablet, Take 1,000 mcg by mouth daily., Disp: , Rfl:  .  vitamin C (ASCORBIC ACID) 500 MG tablet, Take 500 mg by mouth daily., Disp: , Rfl:  .  Zinc 50 MG TABS, Take by mouth daily., Disp: , Rfl:  .  BESIVANCE 0.6 % SUSP, INSTILL 1 DROP INTO LEFT EYE THREE TIMES DAILY AS DIRECTED, Disp: , Rfl:  .  DUREZOL 0.05 % EMUL, INSTILL 1 DROP INTO LEFT EYE THREE TIMES DAILY AS DIRECTED, Disp: , Rfl:  .  Polyethyl Glycol-Propyl Glycol (SYSTANE) 0.4-0.3 % SOLN, Apply to eye., Disp: , Rfl:  .  PROLENSA 0.07 %  SOLN, INSTILL 1 DROP INTO LEFT EYE AT BEDTIME AS DIRECTED, Disp: , Rfl:   EXAM:  VITALS per patient if  applicable:  GENERAL: alert, oriented, appears well and in no acute distress  HEENT: atraumatic, conjunttiva clear, no obvious abnormalities on inspection of external nose and ears  NECK: normal movements of the head and neck  LUNGS: on inspection no signs of respiratory distress, breathing rate appears normal, no obvious gross SOB, gasping or wheezing  CV: no obvious cyanosis  MS: moves all visible extremities without noticeable abnormality  PSYCH/NEURO: pleasant and cooperative, no obvious depression or anxiety, speech and thought processing grossly intact  ASSESSMENT AND PLAN:  Discussed the following assessment and plan:  Hepatic steatosis  Type 2 diabetes mellitus with diabetic nephropathy, with long-term current use of insulin (HCC)  Stage 3a chronic kidney disease  Essential hypertension  Hyperlipidemia associated with type 2 diabetes mellitus ([place])  Endometrioid adenocarcinoma of uterus ([place])  Hepatic steatosis Diagnosis discussed with patient and GI referral offered but deferred.  She is already taking metformin, vitamin D and Lipitor.  Recommending Hep a and B vaccines be done.  LFTs have been normal for the last 2 years.     DM (diabetes mellitus), type 2 with renal complications (HCC) Her diabetes is Currently well-controlled on current medications (JANUVIA, metformin, Levemir and glipizide).  hemoglobin A1c is at goal of less than 7.0 . patient has no microalbuminuria by last check byt has decreaesd GSR that has been stable. . Patient is tolerating statin therapy for CAD risk reduction and on ACE/ARB for renal protection and hypertension   Lab Results  Component Value Date   HGBA1C 6.9 (H) 04/05/2019   Lab Results  Component Value Date   MICROALBUR <0.7 09/27/2018   Lab Results  Component Value Date   CREATININE 0.99 07/03/2019     CKD (chronic kidney disease) stage 3, GFR 30-59 ml/min Renal artery ultrasound was ordered on Oct 22 but not done yet.    Essential hypertension Well controlled on current regimen. Renal function stable, no changes today.  Lab Results  Component Value Date   CREATININE 0.99 07/03/2019   Lab Results  Component Value Date   NA 140 07/03/2019   K 3.4 (L) 07/03/2019   CL 102 07/03/2019   CO2 25 07/03/2019     Hyperlipidemia associated with type 2 diabetes mellitus (HCC) LDL and triglycerides are at goal on current medications. She has no side effects and liver enzymes are normal. No changes today.  Lab Results  Component Value Date   CHOL 208 (H) 04/05/2019   HDL 108.20 04/05/2019   LDLCALC 80 04/05/2019   LDLDIRECT 80.0 02/22/2017   TRIG 97.0 04/05/2019   CHOLHDL 2 04/05/2019   Lab Results  Component Value Date   ALT 21 07/03/2019   AST 24 07/03/2019   ALKPHOS 94 07/03/2019   BILITOT 0.4 07/03/2019      Endometrioid adenocarcinoma of uterus (HCC) Currently taking Letrozole .  Porvera was prescribed by [facility] for peritoneal washings per Dr Theora Gianotti and was stopped when Letrozole was started    I discussed the assessment and treatment plan with the patient. The patient was provided an opportunity to ask questions and all were answered. The patient agreed with the plan and demonstrated an understanding of the instructions.   The patient was advised to call back or seek an in-person evaluation if the symptoms worsen or if the condition fails to improve as anticipated.  I provided  25 minutes of non-face-to-face time during this encounter reviewing patient's current problems and post surgeries.  Providing counseling on the above mentioned problems , and coordination  of care .   Crecencio Mc, MD

## 2019-08-07 NOTE — Telephone Encounter (Signed)
I was contacted by Dr. Derrel Nip that Ms. Mistry has continued on her medroxyprogesterone. She was started on letrozole by Dr. Fransisca Connors for positive cytology. The last time I saw her we did not have medroxyprogesterone on her medication list. She does not need to be on both drugs. Mariea Clonts, RN will reach out to the patient.  Vianca Bracher Gaetana Michaelis, MD

## 2019-08-07 NOTE — Assessment & Plan Note (Signed)
Well controlled on current regimen. Renal function stable, no changes today.  Lab Results  Component Value Date   CREATININE 0.99 07/03/2019   Lab Results  Component Value Date   NA 140 07/03/2019   K 3.4 (L) 07/03/2019   CL 102 07/03/2019   CO2 25 07/03/2019

## 2019-08-07 NOTE — Assessment & Plan Note (Addendum)
Diagnosis discussed with patient and GI referral offered but deferred.  She is already taking metformin, vitamin D and Lipitor.  Recommending Hep a and B vaccines be done.  LFTs have been normal for the last 2 years.

## 2019-08-07 NOTE — Assessment & Plan Note (Signed)
Her diabetes is Currently well-controlled on current medications (JANUVIA, metformin, Levemir and glipizide).  hemoglobin A1c is at goal of less than 7.0 . patient has no microalbuminuria by last check byt has decreaesd GSR that has been stable. . Patient is tolerating statin therapy for CAD risk reduction and on ACE/ARB for renal protection and hypertension   Lab Results  Component Value Date   HGBA1C 6.9 (H) 04/05/2019   Lab Results  Component Value Date   MICROALBUR <0.7 09/27/2018   Lab Results  Component Value Date   CREATININE 0.99 07/03/2019

## 2019-08-07 NOTE — Progress Notes (Signed)
Blood sugar was 126 on yesterday.  Pt is not sure if she should be taking medroxy progesterone. Please advise.

## 2019-08-08 ENCOUNTER — Telehealth: Payer: Self-pay

## 2019-08-08 NOTE — Telephone Encounter (Signed)
Voicemail left with Sandra Hendrix to return call regarding her medroxypogesterone. She is correct that she should not be on this medication. It was stopped following her 02/2017 surgery.

## 2019-08-12 ENCOUNTER — Ambulatory Visit: Payer: Medicare Other

## 2019-08-13 ENCOUNTER — Other Ambulatory Visit: Payer: Self-pay | Admitting: Internal Medicine

## 2019-08-16 DIAGNOSIS — IMO0002 Reserved for concepts with insufficient information to code with codable children: Secondary | ICD-10-CM | POA: Insufficient documentation

## 2019-08-16 DIAGNOSIS — E1149 Type 2 diabetes mellitus with other diabetic neurological complication: Secondary | ICD-10-CM | POA: Insufficient documentation

## 2019-08-20 ENCOUNTER — Other Ambulatory Visit: Payer: Self-pay

## 2019-08-20 ENCOUNTER — Ambulatory Visit (INDEPENDENT_AMBULATORY_CARE_PROVIDER_SITE_OTHER): Payer: Medicare Other

## 2019-08-20 DIAGNOSIS — Z23 Encounter for immunization: Secondary | ICD-10-CM | POA: Diagnosis not present

## 2019-08-20 MED ORDER — METFORMIN HCL 500 MG PO TABS: 500.0000 mg | ORAL_TABLET | Freq: Two times a day (BID) | ORAL | 2 refills | Status: DC

## 2019-08-20 NOTE — Progress Notes (Signed)
Patient came in today for first dose of hep a/b vaccine. Patient received in left deltoid & patient tolerated well.

## 2019-09-04 ENCOUNTER — Ambulatory Visit: Payer: Medicare Other

## 2019-09-10 ENCOUNTER — Other Ambulatory Visit: Payer: Self-pay

## 2019-09-10 NOTE — Progress Notes (Signed)
Patient pre screened for office appointment, no questions or concerns today. Patient reminded of upcoming appointment time and date. 

## 2019-09-11 ENCOUNTER — Telehealth: Payer: Self-pay | Admitting: Internal Medicine

## 2019-09-11 ENCOUNTER — Other Ambulatory Visit: Payer: Self-pay

## 2019-09-11 ENCOUNTER — Inpatient Hospital Stay: Payer: Medicare Other | Attending: Obstetrics and Gynecology | Admitting: Obstetrics and Gynecology

## 2019-09-11 VITALS — BP 130/78 | HR 76 | Temp 98.1°F | Resp 18 | Wt 178.0 lb

## 2019-09-11 DIAGNOSIS — N183 Chronic kidney disease, stage 3 unspecified: Secondary | ICD-10-CM | POA: Insufficient documentation

## 2019-09-11 DIAGNOSIS — Z90722 Acquired absence of ovaries, bilateral: Secondary | ICD-10-CM | POA: Insufficient documentation

## 2019-09-11 DIAGNOSIS — Z90710 Acquired absence of both cervix and uterus: Secondary | ICD-10-CM | POA: Insufficient documentation

## 2019-09-11 DIAGNOSIS — R32 Unspecified urinary incontinence: Secondary | ICD-10-CM | POA: Diagnosis not present

## 2019-09-11 DIAGNOSIS — C55 Malignant neoplasm of uterus, part unspecified: Secondary | ICD-10-CM | POA: Diagnosis not present

## 2019-09-11 DIAGNOSIS — Z79899 Other long term (current) drug therapy: Secondary | ICD-10-CM | POA: Diagnosis not present

## 2019-09-11 DIAGNOSIS — I129 Hypertensive chronic kidney disease with stage 1 through stage 4 chronic kidney disease, or unspecified chronic kidney disease: Secondary | ICD-10-CM | POA: Diagnosis not present

## 2019-09-11 DIAGNOSIS — E1122 Type 2 diabetes mellitus with diabetic chronic kidney disease: Secondary | ICD-10-CM | POA: Diagnosis not present

## 2019-09-11 DIAGNOSIS — E785 Hyperlipidemia, unspecified: Secondary | ICD-10-CM | POA: Diagnosis not present

## 2019-09-11 DIAGNOSIS — Z923 Personal history of irradiation: Secondary | ICD-10-CM | POA: Diagnosis not present

## 2019-09-11 DIAGNOSIS — C541 Malignant neoplasm of endometrium: Secondary | ICD-10-CM | POA: Diagnosis not present

## 2019-09-11 DIAGNOSIS — Z79811 Long term (current) use of aromatase inhibitors: Secondary | ICD-10-CM | POA: Diagnosis not present

## 2019-09-11 NOTE — Telephone Encounter (Signed)
Please ask patient why she hasn't filled her atorvastatin .  Recent studies have shown that for patients with  diabetes  Taking a  Statin reduces their risk of heart attack regardless of cholesterol levels

## 2019-09-11 NOTE — Progress Notes (Signed)
Gynecologic Oncology Interval Visit   Referring Provider: Brayton Mars, MD/Dr. Marcelline Mates  Chief Concern: Stage IB WELL-DIFFERENTIATED ENDOMETRIOID ADENOCARCINOMA of the endometrium with positive cytology  Subjective:  Sandra Hendrix is a 73 y.o. female who is seen in consultation from Dr. Enzo Bi for stage IB grade 1 endometrial cancer with positive cytology , s/p TLH, BSO, SLN mapping and biopsies on 03/01/17, XRT, who returns to clinic today for routine follow up and pelvic exam.   She continued anti-estrogen therapy with letrozole. She was seen by Dr. Marcelline Mates 11/30/2018 with negative exam. She saw Dr. Baruch Gouty 05/23/2019 with negative exam.    She has not yet had mammogram in 2020 - we requested she follow up with Dr. Derrel Nip.   Today, she reports feeling well. She continues letrozole. She continues to have increasing urinary incontinence, but not other complaints.  Gynecologic Oncology History She presented with postmenopausal bleeding. Her evaluation included Pap on 10/17/2016 with endometrial cells present otherwise NILM; and on 11/16/2016 endometrial biopsy that revealed atypical glandular epithelium.   Her evaluation also included pelvic US 10/17/2016 that showed uterine mass.  She underwent hysteroscopy/D&C on 01/02/2017 that revealed grade 1 endometrioid endometrial cancer.  Definitive cancer surgery was scheduled 5/9, but was delayed a month due to death of her bother on 02/23/2023.     Underwent TLH, BSO, SLN mapping and biopsieson 03/01/17  DIAGNOSIS:  A. SENTINEL LYMPH NODE 1, LEFT EXTERNAL ILIAC; EXCISION:  - NO TUMOR SEEN IN ONE LYMPH NODE (0/1).   B. SENTINEL LYMPH NODE 2, LEFT EXTERNAL ILIAC; EXCISION:  - NO TUMOR SEEN IN ONE LYMPH NODE (0/1).   C. SENTINEL LYMPH NODE 3, LEFT EXTERNAL ILIAC; EXCISION:  - NO TUMOR SEEN IN ONE LYMPH NODE (0/1).   D. SENTINEL LYMPH NODE, RIGHT EXTERNAL ILIAC; EXCISION:  - NO TUMOR SEEN IN ONE LYMPH NODE (0/1).   E. UTERUS WITH CERVIX,  BILATERAL FALLOPIAN TUBES AND OVARIES;  LAPAROSCOPIC TOTAL HYSTERECTOMY AND BILATERAL SALPINGO-OOPHORECTOMY:  - ENDOMETRIOID CARCINOMA WITH SQUAMOUS DIFFERENTIATION, FIGO GRADE I.  - INVASION EXTENDS INTO THE OUTER HALF OF THE MYOMETRIUM.  - AGE-RELATED CHANGE, BILATERAL OVARIES.  - PARATUBAL CYSTS, BILATERAL FALLOPIAN TUBES.  - SEE SUMMARY BELOW.   Surgical Pathology Cancer Case Summary  ENDOMETRIUM:  Procedure: Laparoscopic total hysterectomy, bilateral  salpingo-oophorectomy and sentinel lymph node excision  Histologic Type: Endometrioid carcinoma with squamous differentiation  Histologic Grade: FIGO grade 1  Myometrial Invasion: Present; Depth of invasion: 26.75 mm; Myometrial  thickness (millimeters): 27 mm; Percentage of myometrial invasion: 99%  Uterine Serosa Involvement: Not identified  Cervical Stromal Involvement: Not identified  Other Tissue/Organ Involvement: Not identified  Peritoneal/ Ascitic Fluid: Positive for malignancy  Margins: Not applicable  Lymphovascular Invasion: Not identified  Regional Lymph Nodes:  # of pelvicnodes with macrometastasis: 0  # of pelvic nodes with micrometastasis: 0  Total # of pelvic nodes examined: 4  # of pelvic sentinel nodes examined: 4  Laterality of pelvic nodes examined: left and right  Pathologic Stage Classification (pTNM, AJCC 8th Edition): pT1b pN0 (sn)  / FIGO IB   She received adjuvant whole pelvic radiation w/ Dr. Baruch Gouty over 5 weeks completing 05/23/17. She did have a post-radiation UTI which was treated and resolved with antibiotics.   She initiated on Letrozole on 05/15/17 for positive washings.    11/16/17 Transvaginal u/s with Dr. Enzo Bi on was negative for cysts or masses.   12/20/2017 in [place] clinic and had a negative exam. She started adjuvant letrozole therapy for  positive cytology around March 2019. DEXA scan 02/09/2015 normal bone density.  10/23/2018 Dexa scan performed reported as normal with  t-score of 0.3. Plan to repeat in 2022.  She alternates visits between Dr. Marcelline Mates and Dr. Baruch Gouty and has been NED.   Problem List: Patient Active Problem List   Diagnosis Date Noted  . Hepatic steatosis 07/18/2019  . Precordial pain 07/03/2019  . Epigastric pain 07/03/2019  . Vertigo, labyrinthine, unspecified laterality 03/30/2019  . Chronic hip pain, left 09/29/2018  . Bone pain of lower leg 09/29/2018  . Pain of right tibia 09/29/2018  . Hyperlipidemia associated with type 2 diabetes mellitus ([place]) 03/29/2018  . Bilateral pes planus 03/29/2018  . SUI (stress urinary incontinence, female) 11/23/2017  . Vaginal atrophy 11/23/2017  . Obesity (BMI 30.0-34.9) 06/20/2017  . Carpal tunnel syndrome on right 06/20/2017  . Endometrioid adenocarcinoma of uterus ([place]) 01/19/2017  . OSA on CPAP 11/24/2016  . Cyst of ovary 11/16/2016  . Cervical stenosis (uterine cervix) 11/16/2016  . Cyst, vulva 10/11/2015  . CKD (chronic kidney disease) stage 3, GFR 30-59 ml/min 11/07/2014  . GERD (gastroesophageal reflux disease) 12/26/2013  . Irritable bowel syndrome with constipation 12/26/2013  . Encounter for preventive health examination 10/21/2013  . Hyperlipidemia LDL goal <100 07/15/2013  . Chronic venous insufficiency 03/21/2013  . DM (diabetes mellitus), type 2 with renal complications ([place]) [id number]  . Lack of libido 11/05/2012  . Barrett's esophagus 11/05/2012  . Hiatal hernia 11/05/2012  . Essential hypertension 11/05/2012   Past Medical History: Past Medical History:  Diagnosis Date  . Allergy   . Anxiety   . Arthritis   . Barrett's esophagus determined by endoscopy   . Cancer [facility])    Uterine Cancer  . Chronic kidney disease   . Depression   . Diabetes mellitus without complication ([place])   . Difficult intubation    "not sure what happened,asleep was told had problem with intubation"  . Diverticulosis   . GERD (gastroesophageal reflux disease)   . History of bronchitis    . History of hiatal hernia   . Hyperlipidemia   . Hypertension   . Sleep apnea    Use C- PAP  . Urinary incontinence   . Varicose veins    Past Surgical History: Past Surgical History:  Procedure Laterality Date  . ABDOMINAL HYSTERECTOMY  03/01/2017  . CARDIAC CATHETERIZATION    . CHOLECYSTECTOMY    . COLONOSCOPY WITH PROPOFOL N/A 06/14/2016   Procedure: COLONOSCOPY WITH PROPOFOL;  Surgeon: Lollie Sails, MD;  Location: [facility] ENDOSCOPY;  Service: Endoscopy;  Laterality: N/A;  . ESOPHAGOGASTRODUODENOSCOPY (EGD) WITH PROPOFOL N/A 06/14/2016   Procedure: ESOPHAGOGASTRODUODENOSCOPY (EGD) WITH PROPOFOL;  Surgeon: Lollie Sails, MD;  Location: [facility] ENDOSCOPY;  Service: Endoscopy;  Laterality: N/A;  . ESOPHAGOGASTRODUODENOSCOPY (EGD) WITH PROPOFOL N/A 06/12/2017   Procedure: ESOPHAGOGASTRODUODENOSCOPY (EGD) WITH PROPOFOL;  Surgeon: Lollie Sails, MD;  Location: [facility] ENDOSCOPY;  Service: Endoscopy;  Laterality: N/A;  . ESOPHAGOGASTRODUODENOSCOPY (EGD) WITH PROPOFOL N/A 08/25/2017   Procedure: ESOPHAGOGASTRODUODENOSCOPY (EGD) WITH PROPOFOL;  Surgeon: Lollie Sails, MD;  Location: [facility] ENDOSCOPY;  Service: Endoscopy;  Laterality: N/A;  . HYSTEROSCOPY W/D&C N/A 01/02/2017   Procedure: DILATATION AND CURETTAGE /HYSTEROSCOPY;  Surgeon: Brayton Mars, MD;  Location: [facility] ORS;  Service: Gynecology;  Laterality: N/A;  . JOINT REPLACEMENT Right 2007   Total Knee Replacement  . JOINT REPLACEMENT Left 2004   Total Knee Replacement  . LAPAROSCOPIC HYSTERECTOMY Bilateral 03/01/2017   Procedure: HYSTERECTOMY TOTAL LAPAROSCOPIC BSO;  Surgeon: Mellody Drown, MD;  Location: [facility] ORS;  Service: Gynecology;  Laterality: Bilateral;  . ROTATOR CUFF REPAIR Right   . SENTINEL NODE BIOPSY N/A 03/01/2017   Procedure: SENTINEL NODE INJECTION AND BIOPSY;  Surgeon: Mellody Drown, MD;  Location: [facility] ORS;  Service: Gynecology;  Laterality: N/A;  . TONSILLECTOMY     as a child  . TUBAL LIGATION     . UPPER GI ENDOSCOPY     x2   Past Gynecologic History:  Patient is postmenopausal. Menarche: 8 History of Abnormal pap: yes, endometrial cells w/o NILM Last pap: 2018 as per interval history  Last Pap:10/17/2016 negative for intraepithelial lesions or malignancy. Benign reparative/reactive changes. ENDOMETRIAL CELLS PRESENT Sexually active: no;  She did not have mammogram in 2018. 2019 Mammogram has been ordered.   OB History:  OB History  Gravida Para Term Preterm AB Living  2 2 2     2   SAB TAB Ectopic Multiple Live Births          2    # Outcome Date GA Lbr Len/2nd Weight Sex Delivery Anes PTL Lv  2 Term 1972   9 lb (4.082 kg) F Vag-Spont   LIV  1 Term 1970   9 lb (4.082 kg) F Vag-Spont   LIV    Family History: Family History  Problem Relation Age of Onset  . Stroke Mother   . Hypertension Mother   . Lupus Mother   . Alcohol abuse Father   . Diabetes Father   . Cancer Father        gallbladder ca into liver   . Arthritis Sister   . Diabetes Sister   . Lumbar disc disease Sister   . Hyperlipidemia Daughter   . Hypertension Daughter   . Cancer Maternal Aunt        ovarian ca  . Diabetes Paternal Aunt   . Drug abuse Paternal Aunt   . Cancer Maternal Grandfather        colon ca  . Hypertension Brother     Social History: Social History   Socioeconomic History  . Marital status: Married    Spouse name: Not on file  . Number of children: 2  . Years of education: 87  . Highest education level: Some college, no degree  Occupational History  . Not on file  Tobacco Use  . Smoking status: Never Smoker  . Smokeless tobacco: Never Used  Substance and Sexual Activity  . Alcohol use: Yes    Comment: rare  . Drug use: No  . Sexual activity: Yes    Comment: not often  Other Topics Concern  . Not on file  Social History Narrative  . Not on file   Social Determinants of Health   Financial Resource Strain:   . Difficulty of Paying Living Expenses: Not on  file  Food Insecurity:   . Worried About Charity fundraiser in the Last Year: Not on file  . Ran Out of Food in the Last Year: Not on file  Transportation Needs:   . Lack of Transportation (Medical): Not on file  . Lack of Transportation (Non-Medical): Not on file  Physical Activity:   . Days of Exercise per Week: Not on file  . Minutes of Exercise per Session: Not on file  Stress:   . Feeling of Stress : Not on file  Social Connections:   . Frequency of Communication with Friends and Family: Not on file  . Frequency of Social  Gatherings with Friends and Family: Not on file  . Attends Religious Services: Not on file  . Active Member of Clubs or Organizations: Not on file  . Attends Archivist Meetings: Not on file  . Marital Status: Not on file  Intimate Partner Violence:   . Fear of Current or Ex-Partner: Not on file  . Emotionally Abused: Not on file  . Physically Abused: Not on file  . Sexually Abused: Not on file    Allergies: Allergies  Allergen Reactions  . Clindamycin/Lincomycin Rash  . Codeine Rash  . Lincomycin Rash  . Morphine And Related Nausea Only  . Talwin [Pentazocine] Nausea And Vomiting  . Lisinopril Cough    cough  . Naloxone Other (See Comments)    Had issues with sedation and vomiting with EGD  . Oxycodone-Acetaminophen Other (See Comments)    Pt states this medication makes her bowels stop moving    Current Medications: Current Outpatient Medications  Medication Sig Dispense Refill  . ACCU-CHEK AVIVA PLUS test strip TEST TWO TIMES DAILY 200 each 1  . acetaminophen (TYLENOL) 500 MG tablet Take 2 tablets (1,000 mg total) by mouth every 6 (six) hours. (Patient taking differently: Take 1,000 mg every 6 (six) hours as needed by mouth. ) 65 tablet 1  . albuterol (PROVENTIL HFA;VENTOLIN HFA) 108 (90 BASE) MCG/ACT inhaler Inhale 2 puffs into the lungs every 6 (six) hours as needed for wheezing or shortness of breath.     Marland Kitchen aspirin EC 81 MG  tablet Take 81 mg by mouth every evening.     Marland Kitchen atorvastatin (LIPITOR) 40 MG tablet TAKE 1 TABLET BY MOUTH  DAILY AT 6 PM. 90 tablet 3  . azelastine (ASTELIN) 0.1 % nasal spray Place 1 spray into both nostrils daily.    Marland Kitchen BESIVANCE 0.6 % SUSP INSTILL 1 DROP INTO LEFT EYE THREE TIMES DAILY AS DIRECTED    . Biotin 5000 MCG TABS Take by mouth 2 (two) times daily.     . Cholecalciferol (D3-1000 PO) Take 1 tablet by mouth daily.     Verneita Griffes Bark POWD Take by mouth.    . COLLAGEN PO Take by mouth 2 (two) times daily. For improved fingernail strength    . cyclobenzaprine (FLEXERIL) 10 MG tablet Take 10 mg by mouth daily as needed for muscle spasms.    Marland Kitchen docusate sodium (COLACE) 100 MG capsule Take 1 capsule (100 mg total) by mouth 2 (two) times daily. 20 capsule 0  . DUREZOL 0.05 % EMUL INSTILL 1 DROP INTO LEFT EYE THREE TIMES DAILY AS DIRECTED    . fish oil-omega-3 fatty acids 1000 MG capsule Take 1 g by mouth every evening.     . fluticasone (FLONASE) 50 MCG/ACT nasal spray Place 1 spray into both nostrils daily. 48 g 1  . furosemide (LASIX) 20 MG tablet TAKE 1 TABLET BY MOUTH  EVERY OTHER DAY 45 tablet 3  . glipiZIDE (GLUCOTROL XL) 5 MG 24 hr tablet TAKE 1 TABLET BY MOUTH  DAILY 90 tablet 3  . JANUVIA 100 MG tablet TAKE 1 TABLET BY MOUTH  DAILY 90 tablet 3  . Lancet Devices (ACCU-CHEK SOFTCLIX) lancets Use to test blood sugars 1 -2 times daily 1 each 3  . letrozole (FEMARA) 2.5 MG tablet TAKE ONE TABLET BY MOUTH EVERY DAY 90 tablet 2  . LEVEMIR 100 UNIT/ML injection INJECT 20 UNITS SUBCUTANEOUSLY AT BEDTIME (DISCARD AFTER 42 DAYS) 10 mL 0  . loratadine (CLARITIN) 10 MG tablet  Take 10 mg by mouth daily.    Marland Kitchen losartan-hydrochlorothiazide (HYZAAR) 50-12.5 MG tablet TAKE 1 TABLET BY MOUTH  DAILY 90 tablet 3  . Magnesium 250 MG TABS Take 1 tablet by mouth daily.    . meclizine (ANTIVERT) 25 MG tablet Take 1 tablet (25 mg total) by mouth 3 (three) times daily as needed for dizziness. 30 tablet 3  .  metFORMIN (GLUCOPHAGE) 500 MG tablet Take 1 tablet (500 mg total) by mouth 2 (two) times daily with a meal. 180 tablet 2  . Multiple Vitamin (MULTIVITAMIN) tablet Take 1 tablet by mouth daily.    . nitroGLYCERIN (NITROSTAT) 0.4 MG SL tablet Place 1 tablet (0.4 mg total) under the tongue every 5 (five) minutes as needed for chest pain. 50 tablet 3  . NON FORMULARY medroxy progesterone one 10mg  pill once daily    . pantoprazole (PROTONIX) 40 MG tablet Take 1 tablet by mouth twice daily 180 tablet 0  . Polyethyl Glycol-Propyl Glycol (SYSTANE) 0.4-0.3 % SOLN Apply to eye.    Marland Kitchen Potassium 99 MG TABS Take 1 tablet by mouth daily.    . Probiotic Product (PROBIOTIC-10) CAPS Take 1 capsule by mouth daily.     Marland Kitchen PROLENSA 0.07 % SOLN INSTILL 1 DROP INTO LEFT EYE AT BEDTIME AS DIRECTED    . sertraline (ZOLOFT) 50 MG tablet TAKE 1 TABLET BY MOUTH  DAILY 90 tablet 3  . verapamil (VERELAN PM) 180 MG 24 hr capsule TAKE 1 CAPSULE BY MOUTH  DAILY 90 capsule 3  . vitamin B-12 (CYANOCOBALAMIN) 1000 MCG tablet Take 1,000 mcg by mouth daily.    . vitamin C (ASCORBIC ACID) 500 MG tablet Take 500 mg by mouth daily.    . Zinc 50 MG TABS Take by mouth daily.     No current facility-administered medications for this visit.    Review of Systems General:  no complaints Skin: no complaints Eyes: no complaints HEENT: no complaints Breasts: no complaints Pulmonary: no complaints Cardiac: no complaints Gastrointestinal: no complaints Genitourinary/Sexual: as per HPI Ob/Gyn: no complaints Musculoskeletal: no complaints Hematology: no complaints Neurologic/Psych: no complaints   Objective:  Physical Examination:  There were no vitals taken for this visit.  ECOG Performance Status: 0 - Asymptomatic  GENERAL: Patient is a well appearing female in no acute distress HEENT:  Sclera clear. Anicteric NODES:  Negative axillary, supraclavicular, inguinal lymph node survery LUNGS:  Clear to auscultation bilaterally.    HEART:  Regular rate and rhythm.  ABDOMEN:  Soft, nontender.  No hernias, incisions well healed. No masses or ascites EXTREMITIES:  No peripheral edema. Atraumatic. No cyanosis SKIN:  Clear with no obvious rashes or skin changes.  NEURO:  Nonfocal. Well oriented.  Appropriate affect.  Pelvic: exam chaperoned by RN;  Vulva: normal appearing vulva w/o masses, tenderness or lesions. Vagina: normal vagina with well healed cuff. Anterior cystocele. Bimanual: no evidences of masses. RV: not indicated   Assessment:  Sandra Hendrix is a 72 y.o. female w/ hx of stage IB, grade 1 endometrioid cancer (positive cytology) w/ hx of of disease invading full thickness of myometrium and positive pelvic peritoneal cytology, bilaterial sentinel nodes, cervix, and advexa were negative. She is s/p whole pelvic radiation and now on Letrozole for positive cytology.No evidence of disease on exam.   Stress urinary incontinence has worsened.   Left hip pain, possible secondary to arthritis.  Medical co-morbidities complicating care include: OSA on CPAP, HTN DM, CKD, and obesity with BMI 33.4. She has  started weight watchers and is actively trying to lose weight.   Plan:   Problem List Items Addressed This Visit      Genitourinary   Endometrioid adenocarcinoma of uterus ([place]) - Primary     Discontinue anti-estrogen therapy (letrozole) and return to clinic in 12 months for complete exam & follow-up. No need to repeat DEXA since she is coming off letrozole. We discussed that we do not know the correct time line for letrozole therapy and two years is a recommendation that has been used in the past for Megace therapy. Letrozole can have negative bone effects and other side effects, although she seems to be tolerating therapy well.   Dr. Marcelline Mates in 4 months and then Dr. Baruch Gouty in 8 months; RTC in 08/2020. She will follow up with Dr. Marcelline Mates April 2021; Dr. Baruch Gouty as scheduled in 05/2020.   Urinary incontinence-  worsening symptoms. Refer to Dr. Matilde Sprang for evaluation and we provided information about Kegel exercises today.  A total of 30  minutes were spent with the patient/family today; >50% was spent in education, counseling and coordination of care for h/o endometrial cancer.  Follow up with Dr. Derrel Nip to schedule her mammogram and for her other medical issues.   Verlon Au, NP helped scribe the note for me.   I personally had a face to face interaction and evaluated the patient. I performed all the portions of the physical exam. I agree with the above documentation, assessment and plan which was fully formulated by me.  Counseling was completed by me.    Larenda Reedy Gaetana Michaelis, MD    CC: Dr. Marcelline Mates  PCP: Crecencio Mc, MD [address] [place], [place] [id number] [phone]

## 2019-09-11 NOTE — Patient Instructions (Addendum)
You can move your appointment with Dr. Marcelline Mates to April 2021 Keep appointment with Dr. Baruch Gouty in Sept 2021. Gyn Onc will see you back in one year. 08/2020

## 2019-09-11 NOTE — Progress Notes (Signed)
Pt having difficulty "controlling bladder". Pt in for follow up, reports RN called yesterday for pre assessment denies any changes.

## 2019-09-16 ENCOUNTER — Other Ambulatory Visit: Payer: Self-pay | Admitting: Internal Medicine

## 2019-09-17 ENCOUNTER — Ambulatory Visit (INDEPENDENT_AMBULATORY_CARE_PROVIDER_SITE_OTHER): Payer: Medicare Other

## 2019-09-17 ENCOUNTER — Other Ambulatory Visit: Payer: Self-pay

## 2019-09-17 VITALS — Temp 96.4°F

## 2019-09-17 DIAGNOSIS — Z23 Encounter for immunization: Secondary | ICD-10-CM

## 2019-09-17 NOTE — Telephone Encounter (Signed)
LMTCB

## 2019-09-18 MED ORDER — ATORVASTATIN CALCIUM 40 MG PO TABS: 20.0000 mg | ORAL_TABLET | Freq: Every day | ORAL | 3 refills | Status: DC

## 2019-09-18 NOTE — Telephone Encounter (Signed)
Spoke with pt and stated that she is taking the atorvastatin daily. She stated that she has the 40 mg tablet but was told to reduce to 20 mg so she has been taking 1/2 tablet and the rx is lasting a lot longer than it should.

## 2019-09-18 NOTE — Assessment & Plan Note (Signed)
LDL and triglycerides are at goal on current  Dose of atorvastatin,  20 mg daily she has no side effects and liver enzymes are normal. No changes today.  Lab Results  Component Value Date   CHOL 208 (H) 04/05/2019   HDL 108.20 04/05/2019   LDLCALC 80 04/05/2019   LDLDIRECT 80.0 02/22/2017   TRIG 97.0 04/05/2019   CHOLHDL 2 04/05/2019   Lab Results  Component Value Date   ALT 21 07/03/2019   AST 24 07/03/2019   ALKPHOS 94 07/03/2019   BILITOT 0.4 07/03/2019

## 2019-09-18 NOTE — Telephone Encounter (Signed)
My bad.  Chart updated and refill sent

## 2019-09-18 NOTE — Telephone Encounter (Signed)
Pt called returning your call 

## 2019-09-30 ENCOUNTER — Encounter: Payer: Self-pay | Admitting: Urology

## 2019-09-30 ENCOUNTER — Other Ambulatory Visit: Payer: Self-pay

## 2019-09-30 ENCOUNTER — Ambulatory Visit: Payer: Medicare PPO | Admitting: Urology

## 2019-09-30 VITALS — BP 149/77 | HR 82 | Ht 61.0 in | Wt 178.0 lb

## 2019-09-30 DIAGNOSIS — N3946 Mixed incontinence: Secondary | ICD-10-CM

## 2019-09-30 LAB — URINALYSIS, COMPLETE
Bilirubin, UA: NEGATIVE
Glucose, UA: NEGATIVE
Ketones, UA: NEGATIVE
Leukocytes,UA: NEGATIVE
Nitrite, UA: NEGATIVE
Protein,UA: NEGATIVE
RBC, UA: NEGATIVE
Specific Gravity, UA: 1.025 (ref 1.005–1.030)
Urobilinogen, Ur: 0.2 mg/dL (ref 0.2–1.0)
pH, UA: 5.5 (ref 5.0–7.5)

## 2019-09-30 LAB — MICROSCOPIC EXAMINATION: RBC, Urine: NONE SEEN /hpf (ref 0–2)

## 2019-09-30 NOTE — Patient Instructions (Signed)
Urodynamic Testing What is urodynamic testing?  Urodynamic testing is a set of tests and X-rays. These tests help to find out how well your bladder and the part of your body that drains pee from the bladder (urethra) are working. Why do I need this testing? You may need these tests if you:  Are leaking pee (urine).  Have trouble starting or stopping peeing (urination).  Pee often or it hurts to pee.  Have urinary tract infections often.  Are not able to empty your bladder.  Have strong urges to pee.  Have a weak flow of pee. How do I prepare for the tests?  Ask your doctor about: ? Changing or stopping your normal medicines. This is important if you take diabetes medicines or blood thinners. ? Whether you should arrive for the test having to pee.  Tell your doctor about: ? Any allergies you have. ? All medicines you are taking, including vitamins, herbs, eye drops, creams, and over-the-counter medicines. ? Whether you are pregnant or may be pregnant. What are the risks? In general, these tests are safe. But some of the tests have risks. These may include:  Discomfort.  Feeling a need to pee often.  Bleeding.  Infection.  Allergic reactions to medicines or dyes. How are the tests done? The tests may be done in one visit or may be done over a few visits. You may be given an antibiotic medicine to help keep you from getting an infection. The types of tests done may include: Uroflowmetry This test measures how much you pee and how long it takes. You will pee into a type of toilet or device that sends measurements to a computer. Postvoid residual measurement This test measures how much pee is left in your bladder after you pee. It may be done by:  Using sound waves and a computer to create pictures of your bladder (ultrasound).  Putting a thin, flexible tube (catheter) into your bladder to take out the pee that is left so it can be measured. Cystometric testing This  test measures how much pressure there is in your bladder before you pee and as you pee.  You may be given a medicine to numb the area (local anesthetic).  The area around the opening of your urethra will be cleaned.  A thin, flexible tube will be used to empty your bladder.  A flexible tube that can measure pressure will then be placed. Your bladder will be filled with germ-free water.  Pressure will be measured: ? As your bladder fills. ? When you feel the need to pee. ? As your bladder is emptied.  In some cases, your bladder may be filled with a dye that shows up on X-rays (contrast material) so that X-ray pictures can be taken during the test. Electromyogram This test measures the activity of the nerves and muscles in your bladder and in the tube that empties your bladder. Sticky patches (electrodes) will be placed on your body to measure electrical activity. What happens after the testing?  You should be able to go home right away.  You can do your usual activities.  You may be told to drink a glass of water every 30 minutes. Do this for the first 2 hours you are home.  Taking a warm bath or using a warm, wet towel may relieve any discomfort. Let your doctor know if you have:  Pain.  Blood in your pee.  Chills.  Fever. What do my test results mean? Talk   with your doctor about what your results mean. These test results can help your doctor find out how well your bladder and the tube that empties your bladder are working. Your results and your symptoms can help your doctor find what might be causing your problems. Questions to ask your doctor Ask your doctor, or the department that is doing the test:  When will my results be ready?  How will I get my results?  What are my treatment options?  What other tests do I need?  What are my next steps? Summary  Urodynamic testing is a set of tests and X-rays.  These tests help to find out how well your bladder and the  part of your body that drains pee from the bladder are working.  Your results and your symptoms can help your doctor find what might be causing your problems.  Talk with your doctor about what your results mean. This information is not intended to replace advice given to you by your health care provider. Make sure you discuss any questions you have with your health care provider. Document Revised: 01/01/2019 Document Reviewed: 07/17/2017 Elsevier Patient Education  2020 [employer] Inc.  

## 2019-09-30 NOTE — Progress Notes (Signed)
09/30/2019 11:32 AM   Sandra Hendrix 1946/01/11 [id number]  Referring provider: Crecencio Mc, MD [place],  [place] [address]  Chief Complaint  Patient presents with  . Bladder Prolapse    HPI: I was consulted to assess the patient's urinary incontinence worsening over many months.  She leaks with coughing sneezing and with bending and lifting of its heavy.  She can leak with walking  She has urge incontinence especially if she holds it long enough.  This is her most bothersome symptom in the morning.  No bedwetting.  2 pads a day moderately wet to soaked  Voids every 2-3 hours with no nocturia.  Insulin-dependent diabetic and has had a hysterectomy  No kidney stones or infections or bladder surgery.  No previous treatment  Modifying factors: There are no other modifying factors  Associated signs and symptoms: There are no other associated signs and symptoms Aggravating and relieving factors: There are no other aggravating or relieving factors Severity: Moderate Duration: Persistent     PMH: Past Medical History:  Diagnosis Date  . Allergy   . Anxiety   . Arthritis   . Barrett's esophagus determined by endoscopy   . Cancer [facility])    Uterine Cancer  . Chronic kidney disease   . Depression   . Diabetes mellitus without complication ([place])   . Difficult intubation    "not sure what happened,asleep was told had problem with intubation"  . Diverticulosis   . GERD (gastroesophageal reflux disease)   . History of bronchitis   . History of hiatal hernia   . Hyperlipidemia   . Hypertension   . Sleep apnea    Use C- PAP  . Urinary incontinence   . Varicose veins     Surgical History: Past Surgical History:  Procedure Laterality Date  . ABDOMINAL HYSTERECTOMY  03/01/2017  . CARDIAC CATHETERIZATION    . CHOLECYSTECTOMY    . COLONOSCOPY WITH PROPOFOL N/A 06/14/2016   Procedure: COLONOSCOPY WITH PROPOFOL;  Surgeon: Lollie Sails,  MD;  Location: [facility] ENDOSCOPY;  Service: Endoscopy;  Laterality: N/A;  . ESOPHAGOGASTRODUODENOSCOPY (EGD) WITH PROPOFOL N/A 06/14/2016   Procedure: ESOPHAGOGASTRODUODENOSCOPY (EGD) WITH PROPOFOL;  Surgeon: Lollie Sails, MD;  Location: [facility] ENDOSCOPY;  Service: Endoscopy;  Laterality: N/A;  . ESOPHAGOGASTRODUODENOSCOPY (EGD) WITH PROPOFOL N/A 06/12/2017   Procedure: ESOPHAGOGASTRODUODENOSCOPY (EGD) WITH PROPOFOL;  Surgeon: Lollie Sails, MD;  Location: [facility] ENDOSCOPY;  Service: Endoscopy;  Laterality: N/A;  . ESOPHAGOGASTRODUODENOSCOPY (EGD) WITH PROPOFOL N/A 08/25/2017   Procedure: ESOPHAGOGASTRODUODENOSCOPY (EGD) WITH PROPOFOL;  Surgeon: Lollie Sails, MD;  Location: [facility] ENDOSCOPY;  Service: Endoscopy;  Laterality: N/A;  . HYSTEROSCOPY WITH D & C N/A 01/02/2017   Procedure: DILATATION AND CURETTAGE /HYSTEROSCOPY;  Surgeon: Brayton Mars, MD;  Location: [facility] ORS;  Service: Gynecology;  Laterality: N/A;  . JOINT REPLACEMENT Right 2007   Total Knee Replacement  . JOINT REPLACEMENT Left 2004   Total Knee Replacement  . LAPAROSCOPIC HYSTERECTOMY Bilateral 03/01/2017   Procedure: HYSTERECTOMY TOTAL LAPAROSCOPIC BSO;  Surgeon: Mellody Drown, MD;  Location: [facility] ORS;  Service: Gynecology;  Laterality: Bilateral;  . ROTATOR CUFF REPAIR Right   . SENTINEL NODE BIOPSY N/A 03/01/2017   Procedure: SENTINEL NODE INJECTION AND BIOPSY;  Surgeon: Mellody Drown, MD;  Location: [facility] ORS;  Service: Gynecology;  Laterality: N/A;  . TONSILLECTOMY     as a child  . TUBAL LIGATION    . UPPER GI ENDOSCOPY     x2  Home Medications:  Allergies as of 09/30/2019      Reactions   Clindamycin/lincomycin Rash   Codeine Rash   Lincomycin Rash   Morphine And Related Nausea Only   Talwin [pentazocine] Nausea And Vomiting   Lisinopril Cough   cough   Naloxone Other (See Comments)   Had issues with sedation and vomiting with EGD   Oxycodone-acetaminophen Other (See Comments)   Pt states this  medication makes her bowels stop moving      Medication List       Accurate as of September 30, 2019 11:32 AM. If you have any questions, ask your nurse or doctor.        STOP taking these medications   Calcium Carbonate-Vitamin D 600-400 MG-UNIT tablet Stopped by: Reece Packer, MD   D3-1000 PO Stopped by: Reece Packer, MD     TAKE these medications   Accu-Chek Aviva Plus test strip Generic drug: glucose blood TEST TWO TIMES DAILY   accu-chek softclix lancets Use to test blood sugars 1 -2 times daily   acetaminophen 500 MG tablet Commonly known as: TYLENOL Take 2 tablets (1,000 mg total) by mouth every 6 (six) hours. What changed:   when to take this  reasons to take this   albuterol 108 (90 Base) MCG/ACT inhaler Commonly known as: VENTOLIN HFA Inhale 2 puffs into the lungs every 6 (six) hours as needed for wheezing or shortness of breath.   amLODipine 5 MG tablet Commonly known as: NORVASC Take by mouth.   aspirin EC 81 MG tablet Take 81 mg by mouth every evening.   atorvastatin 40 MG tablet Commonly known as: LIPITOR Take 0.5 tablets (20 mg total) by mouth daily at 6 PM.   azelastine 0.1 % nasal spray Commonly known as: ASTELIN Place 1 spray into both nostrils daily.   Besivance 0.6 % Susp Generic drug: Besifloxacin HCl INSTILL 1 DROP INTO LEFT EYE THREE TIMES DAILY AS DIRECTED   Biotin 5000 MCG Tabs Take by mouth 2 (two) times daily.   Cetirizine HCl 10 MG Caps Take by mouth.   Cinnamon Bark Powd Take by mouth.   COLLAGEN PO Take by mouth 2 (two) times daily. For improved fingernail strength   cyclobenzaprine 10 MG tablet Commonly known as: FLEXERIL Take 10 mg by mouth daily as needed for muscle spasms.   docusate sodium 100 MG capsule Commonly known as: Colace Take 1 capsule (100 mg total) by mouth 2 (two) times daily.   Durezol 0.05 % Emul Generic drug: Difluprednate INSTILL 1 DROP INTO LEFT EYE THREE TIMES DAILY AS  DIRECTED   fish oil-omega-3 fatty acids 1000 MG capsule Take 1 g by mouth every evening.   fluticasone 50 MCG/ACT nasal spray Commonly known as: FLONASE Place 1 spray into both nostrils daily.   furosemide 20 MG tablet Commonly known as: LASIX TAKE 1 TABLET BY MOUTH  EVERY OTHER DAY   glipiZIDE 5 MG 24 hr tablet Commonly known as: GLUCOTROL XL TAKE 1 TABLET BY MOUTH  DAILY   Januvia 100 MG tablet Generic drug: sitaGLIPtin TAKE 1 TABLET BY MOUTH  DAILY   Levemir 100 UNIT/ML injection Generic drug: insulin detemir Levemir U-100 Insulin 100 unit/mL subcutaneous solution   Levemir 100 UNIT/ML injection Generic drug: insulin detemir INJECT 20 UNITS SUBCUTANEOUSLY AT BEDTIME (DISCARD AFTER 42 DAYS)   loratadine 10 MG tablet Commonly known as: CLARITIN Take 10 mg by mouth daily.   losartan-hydrochlorothiazide 50-12.5 MG tablet Commonly known as: HYZAAR TAKE  1 TABLET BY MOUTH  DAILY   Magnesium 250 MG Tabs Take 1 tablet by mouth daily.   meclizine 25 MG tablet Commonly known as: ANTIVERT Take 1 tablet (25 mg total) by mouth 3 (three) times daily as needed for dizziness.   metFORMIN 500 MG tablet Commonly known as: GLUCOPHAGE Take 1 tablet (500 mg total) by mouth 2 (two) times daily with a meal.   multivitamin tablet Take 1 tablet by mouth daily.   nitroGLYCERIN 0.4 MG SL tablet Commonly known as: NITROSTAT Place 1 tablet (0.4 mg total) under the tongue every 5 (five) minutes as needed for chest pain.   NON FORMULARY medroxy progesterone one 10mg  pill once daily   pantoprazole 40 MG tablet Commonly known as: PROTONIX Take 1 tablet by mouth twice daily   Potassium 99 MG Tabs Take 1 tablet by mouth daily.   Probiotic-10 Caps Take 1 capsule by mouth daily.   Prolensa 0.07 % Soln Generic drug: Bromfenac Sodium INSTILL 1 DROP INTO LEFT EYE AT BEDTIME AS DIRECTED   sertraline 50 MG tablet Commonly known as: ZOLOFT TAKE 1 TABLET BY MOUTH  DAILY   Systane  0.4-0.3 % Soln Generic drug: Polyethyl Glycol-Propyl Glycol Apply to eye.   tiZANidine 4 MG tablet Commonly known as: ZANAFLEX tizanidine 4 mg tablet   verapamil 180 MG 24 hr capsule Commonly known as: VERELAN PM TAKE 1 CAPSULE BY MOUTH  DAILY   vitamin B-12 1000 MCG tablet Commonly known as: CYANOCOBALAMIN Take 1,000 mcg by mouth daily.   vitamin C 500 MG tablet Commonly known as: ASCORBIC ACID Take 500 mg by mouth daily.   Ascorbic Acid 500 MG Chew Chew by mouth.   Zinc 50 MG Tabs Take by mouth daily.       Allergies:  Allergies  Allergen Reactions  . Clindamycin/Lincomycin Rash  . Codeine Rash  . Lincomycin Rash  . Morphine And Related Nausea Only  . Talwin [Pentazocine] Nausea And Vomiting  . Lisinopril Cough    cough  . Naloxone Other (See Comments)    Had issues with sedation and vomiting with EGD  . Oxycodone-Acetaminophen Other (See Comments)    Pt states this medication makes her bowels stop moving    Family History: Family History  Problem Relation Age of Onset  . Stroke Mother   . Hypertension Mother   . Lupus Mother   . Alcohol abuse Father   . Diabetes Father   . Cancer Father        gallbladder ca into liver   . Arthritis Sister   . Diabetes Sister   . Lumbar disc disease Sister   . Hyperlipidemia Daughter   . Hypertension Daughter   . Cancer Maternal Aunt        ovarian ca  . Diabetes Paternal Aunt   . Drug abuse Paternal Aunt   . Cancer Maternal Grandfather        colon ca  . Hypertension Brother     Social History:  reports that she has never smoked. She has never used smokeless tobacco. She reports current alcohol use. She reports that she does not use drugs.  ROS: UROLOGY Frequent Urination?: Yes Hard to postpone urination?: Yes Burning/pain with urination?: No Get up at night to urinate?: No Leakage of urine?: No Urine stream starts and stops?: No Trouble starting stream?: No Do you have to strain to urinate?:  No Blood in urine?: No Urinary tract infection?: No Sexually transmitted disease?: No Injury to kidneys or  bladder?: No Painful intercourse?: No Weak stream?: No Currently pregnant?: No Vaginal bleeding?: No Last menstrual period?: N  Gastrointestinal Nausea?: No Vomiting?: No Indigestion/heartburn?: No Diarrhea?: No Constipation?: No  Constitutional Fever: No Night sweats?: No Weight loss?: No Fatigue?: No  Skin Skin rash/lesions?: No Itching?: No  Eyes Blurred vision?: No Double vision?: No  Ears/Nose/Throat Sore throat?: No Sinus problems?: No  Hematologic/Lymphatic Swollen glands?: No Easy bruising?: No  Cardiovascular Leg swelling?: No Chest pain?: No  Respiratory Cough?: No Shortness of breath?: No  Endocrine Excessive thirst?: No  Musculoskeletal Back pain?: No Joint pain?: No  Neurological Headaches?: No Dizziness?: No  Psychologic Depression?: No Anxiety?: No  Physical Exam: BP (!) 149/77   Pulse 82   Ht 5\' 1"  (1.549 m)   Wt 178 lb (80.7 kg)   BMI 33.63 kg/m   Constitutional:  Alert and oriented, No acute distress. HEENT: [place] AT, moist mucus membranes.  Trachea midline, no masses. Cardiovascular: No clubbing, cyanosis, or edema. Respiratory: Normal respiratory effort, no increased work of breathing. GI: Abdomen is soft, nontender, nondistended, no abdominal masses GU: No CVA tenderness.  Grade 2 hypermobility the bladder neck and negative cough test.  Moderate atrophy.  Little to no prolapse Skin: No rashes, bruises or suspicious lesions. Lymph: No cervical or inguinal adenopathy. Neurologic: Grossly intact, no focal deficits, moving all 4 extremities. Psychiatric: Normal mood and affect.  Laboratory Data: Lab Results  Component Value Date   WBC 4.2 05/15/2017   HGB 12.0 05/15/2017   HCT 34.5 (L) 05/15/2017   MCV 88.0 05/15/2017   PLT 316 05/15/2017    Lab Results  Component Value Date   CREATININE 0.99 07/03/2019     No results found for: PSA  No results found for: TESTOSTERONE  Lab Results  Component Value Date   HGBA1C 6.9 (H) 04/05/2019    Urinalysis    Component Value Date/Time   COLORURINE YELLOW 10/05/2016 1003   APPEARANCEUR CLEAR 10/05/2016 1003   LABSPEC 1.010 10/05/2016 1003   PHURINE 6.0 10/05/2016 1003   GLUCOSEU NEGATIVE 10/05/2016 1003   HGBUR NEGATIVE 10/05/2016 1003   BILIRUBINUR negative 05/26/2017 1122   KETONESUR NEGATIVE 10/05/2016 1003   PROTEINUR negative 05/26/2017 1122   PROTEINUR 30 (A) 02/20/2015 2137   UROBILINOGEN 0.2 05/26/2017 1122   UROBILINOGEN 0.2 10/05/2016 1003   NITRITE negative 05/26/2017 1122   NITRITE NEGATIVE 10/05/2016 1003   LEUKOCYTESUR Trace (A) 05/26/2017 1122    Pertinent Imaging:   Assessment & Plan: Patient has mixed incontinence.  She has mild frequency.  Urine normal.  Medical record reviewed.  Urodynamics recommended and ordered.     There are no diagnoses linked to this encounter.  No follow-ups on file.  Reece Packer, MD  [facility] [address] [place], [place] [address] [phone]

## 2019-10-07 ENCOUNTER — Other Ambulatory Visit: Payer: Self-pay | Admitting: Internal Medicine

## 2019-10-09 ENCOUNTER — Other Ambulatory Visit: Payer: Self-pay

## 2019-10-10 ENCOUNTER — Telehealth: Payer: Self-pay | Admitting: Internal Medicine

## 2019-10-10 NOTE — Telephone Encounter (Signed)
Spoke with pt and she is not coming in tomorrow for a hepatitis vaccine she is coming in for labs. Pt is not due for 3rd twinrix vaccine until May 2021. Pt gave a verbal understanding.

## 2019-10-10 NOTE — Telephone Encounter (Signed)
Pt needs info on her last hepatitis shot for her insurance. Please advise.

## 2019-10-11 ENCOUNTER — Other Ambulatory Visit: Payer: Self-pay

## 2019-10-11 ENCOUNTER — Other Ambulatory Visit (INDEPENDENT_AMBULATORY_CARE_PROVIDER_SITE_OTHER): Payer: Medicare PPO

## 2019-10-11 DIAGNOSIS — E1121 Type 2 diabetes mellitus with diabetic nephropathy: Secondary | ICD-10-CM

## 2019-10-11 DIAGNOSIS — Z794 Long term (current) use of insulin: Secondary | ICD-10-CM

## 2019-10-11 LAB — COMPREHENSIVE METABOLIC PANEL
ALT: 16 U/L (ref 0–35)
AST: 20 U/L (ref 0–37)
Albumin: 4.4 g/dL (ref 3.5–5.2)
Alkaline Phosphatase: 99 U/L (ref 39–117)
BUN: 27 mg/dL — ABNORMAL HIGH (ref 6–23)
CO2: 28 mEq/L (ref 19–32)
Calcium: 9.6 mg/dL (ref 8.4–10.5)
Chloride: 102 mEq/L (ref 96–112)
Creatinine, Ser: 1.24 mg/dL — ABNORMAL HIGH (ref 0.40–1.20)
GFR: 42.11 mL/min — ABNORMAL LOW (ref >60.00)
Glucose, Bld: 164 mg/dL — ABNORMAL HIGH (ref 70–99)
Potassium: 3.3 mEq/L — ABNORMAL LOW (ref 3.5–5.1)
Sodium: 139 mEq/L (ref 135–145)
Total Bilirubin: 0.5 mg/dL (ref 0.2–1.2)
Total Protein: 7.4 g/dL (ref 6.0–8.3)

## 2019-10-11 LAB — MICROALBUMIN / CREATININE URINE RATIO
Creatinine,U: 145.3 mg/dL
Microalb Creat Ratio: 0.9 mg/g (ref 0.0–30.0)
Microalb, Ur: 1.3 mg/dL (ref 0.0–1.9)

## 2019-10-11 LAB — HEMOGLOBIN A1C: Hgb A1c MFr Bld: 7.3 % — ABNORMAL HIGH (ref 4.6–6.5)

## 2019-10-13 ENCOUNTER — Other Ambulatory Visit: Payer: Self-pay | Admitting: Internal Medicine

## 2019-10-13 MED ORDER — POTASSIUM CHLORIDE CRYS ER 20 MEQ PO TBCR: 20.0000 meq | EXTENDED_RELEASE_TABLET | ORAL | 1 refills | Status: DC

## 2019-10-16 ENCOUNTER — Ambulatory Visit (INDEPENDENT_AMBULATORY_CARE_PROVIDER_SITE_OTHER): Payer: Medicare PPO | Admitting: Internal Medicine

## 2019-10-16 ENCOUNTER — Encounter: Payer: Self-pay | Admitting: Internal Medicine

## 2019-10-16 ENCOUNTER — Telehealth: Payer: Self-pay | Admitting: Internal Medicine

## 2019-10-16 ENCOUNTER — Other Ambulatory Visit: Payer: Self-pay

## 2019-10-16 VITALS — Ht 61.0 in | Wt 177.0 lb

## 2019-10-16 DIAGNOSIS — K2270 Barrett's esophagus without dysplasia: Secondary | ICD-10-CM

## 2019-10-16 DIAGNOSIS — E876 Hypokalemia: Secondary | ICD-10-CM | POA: Diagnosis not present

## 2019-10-16 DIAGNOSIS — Z794 Long term (current) use of insulin: Secondary | ICD-10-CM

## 2019-10-16 DIAGNOSIS — N1831 Chronic kidney disease, stage 3a: Secondary | ICD-10-CM

## 2019-10-16 DIAGNOSIS — I872 Venous insufficiency (chronic) (peripheral): Secondary | ICD-10-CM

## 2019-10-16 DIAGNOSIS — E1121 Type 2 diabetes mellitus with diabetic nephropathy: Secondary | ICD-10-CM | POA: Diagnosis not present

## 2019-10-16 DIAGNOSIS — I10 Essential (primary) hypertension: Secondary | ICD-10-CM

## 2019-10-16 NOTE — Patient Instructions (Signed)
Suspend furosemide  Take potassium one tablet daily for 3 days  Return on Monday for non fasting labs ((office will set you up)  RESUME EXERCISE  REPEAT A1C AND OV IN 3 MONTSH

## 2019-10-16 NOTE — Telephone Encounter (Signed)
Lm for pt to call back and schedule non fasting labs on 10/21/19

## 2019-10-16 NOTE — Progress Notes (Addendum)
Virtual Visit converted to telephone   This visit type was conducted due to national recommendations for restrictions regarding the COVID-19 pandemic (e.g. social distancing).  This format is felt to be most appropriate for this patient at this time.  All issues noted in this document were discussed and addressed.  No physical exam was performed (except for noted visual exam findings with Video Visits).   I attempted to connect with@ on 10/17/19 at  8:30 AM EST by a video enabled telemedicine application  and verified that I am speaking with the correct person using two identifiers. Location patient: home Location provider: work or home office Persons participating in the virtual visit: patient, provider  I discussed the limitations, risks, security and privacy concerns of performing an evaluation and management service by telephone and the availability of in person appointments. I also discussed with the patient that there may be a patient responsible charge related to this service. The patient expressed understanding and agreed to proceed.  Interactive audio and video telecommunications were attempted between this provider and patient, however failed, due to patient having technical difficulties .  We continued and completed visit with audio only. Reason for visit: follow up   HPI:  74 yr old female with T2Dm, hypertension, GERD with Barrett's Esophagus,  OSA on CPAP presents forfollow up.  T2DM:    She  feels generally well,  But is not  exercising regularly or trying to lose weight and has gained ten lbs over the last 6 months.  She is Checking  blood sugars less than once daily at variable times, usually only if she feels she may be having a hypoglycemic event. .  BS have been under 130 fasting and < 160 post prandially.  Denies any recent hypoglyemic events.  Taking   medications as directed. Following a carbohydrate modified diet 6 days per week. Denies numbness, burning and tingling of  extremities. Appetite is good.   Reviewed recent labs,  She was noted to have LOW POTASSIUM, and decreased .   She takes a diuretic daily in her anti hypertensive and has been using furosemide eery other day for management of LE edema.  She has no history of CHF, cirrhosis  or nephropathy .  She has not replaced her OTC potassium supplement with the KCL 20 MeQ that was called in several days ago.   ROS: See pertinent positives and negatives per HPI.  Past Medical History:  Diagnosis Date  . Allergy   . Anxiety   . Arthritis   . Barrett's esophagus determined by endoscopy   . Cancer [facility])    Uterine Cancer  . Chronic kidney disease   . Depression   . Diabetes mellitus without complication ([place])   . Difficult intubation    "not sure what happened,asleep was told had problem with intubation"  . Diverticulosis   . GERD (gastroesophageal reflux disease)   . History of bronchitis   . History of hiatal hernia   . Hyperlipidemia   . Hypertension   . Sleep apnea    Use C- PAP  . Urinary incontinence   . Varicose veins     Past Surgical History:  Procedure Laterality Date  . ABDOMINAL HYSTERECTOMY  03/01/2017  . CARDIAC CATHETERIZATION    . CHOLECYSTECTOMY    . COLONOSCOPY WITH PROPOFOL N/A 06/14/2016   Procedure: COLONOSCOPY WITH PROPOFOL;  Surgeon: Lollie Sails, MD;  Location: [facility] ENDOSCOPY;  Service: Endoscopy;  Laterality: N/A;  . ESOPHAGOGASTRODUODENOSCOPY (EGD) WITH PROPOFOL N/A  06/14/2016   Procedure: ESOPHAGOGASTRODUODENOSCOPY (EGD) WITH PROPOFOL;  Surgeon: Lollie Sails, MD;  Location: [facility] ENDOSCOPY;  Service: Endoscopy;  Laterality: N/A;  . ESOPHAGOGASTRODUODENOSCOPY (EGD) WITH PROPOFOL N/A 06/12/2017   Procedure: ESOPHAGOGASTRODUODENOSCOPY (EGD) WITH PROPOFOL;  Surgeon: Lollie Sails, MD;  Location: [facility] ENDOSCOPY;  Service: Endoscopy;  Laterality: N/A;  . ESOPHAGOGASTRODUODENOSCOPY (EGD) WITH PROPOFOL N/A 08/25/2017   Procedure: ESOPHAGOGASTRODUODENOSCOPY  (EGD) WITH PROPOFOL;  Surgeon: Lollie Sails, MD;  Location: [facility] ENDOSCOPY;  Service: Endoscopy;  Laterality: N/A;  . HYSTEROSCOPY WITH D & C N/A 01/02/2017   Procedure: DILATATION AND CURETTAGE /HYSTEROSCOPY;  Surgeon: Brayton Mars, MD;  Location: [facility] ORS;  Service: Gynecology;  Laterality: N/A;  . JOINT REPLACEMENT Right 2007   Total Knee Replacement  . JOINT REPLACEMENT Left 2004   Total Knee Replacement  . LAPAROSCOPIC HYSTERECTOMY Bilateral 03/01/2017   Procedure: HYSTERECTOMY TOTAL LAPAROSCOPIC BSO;  Surgeon: Mellody Drown, MD;  Location: [facility] ORS;  Service: Gynecology;  Laterality: Bilateral;  . ROTATOR CUFF REPAIR Right   . SENTINEL NODE BIOPSY N/A 03/01/2017   Procedure: SENTINEL NODE INJECTION AND BIOPSY;  Surgeon: Mellody Drown, MD;  Location: [facility] ORS;  Service: Gynecology;  Laterality: N/A;  . TONSILLECTOMY     as a child  . TUBAL LIGATION    . UPPER GI ENDOSCOPY     x2    Family History  Problem Relation Age of Onset  . Stroke Mother   . Hypertension Mother   . Lupus Mother   . Alcohol abuse Father   . Diabetes Father   . Cancer Father        gallbladder ca into liver   . Arthritis Sister   . Diabetes Sister   . Lumbar disc disease Sister   . Hyperlipidemia Daughter   . Hypertension Daughter   . Cancer Maternal Aunt        ovarian ca  . Diabetes Paternal Aunt   . Drug abuse Paternal Aunt   . Cancer Maternal Grandfather        colon ca  . Hypertension Brother     SOCIAL HX:  reports that she has never smoked. She has never used smokeless tobacco. She reports current alcohol use. She reports that she does not use drugs.   Current Outpatient Medications:  .  ACCU-CHEK AVIVA PLUS test strip, TEST TWO TIMES DAILY, Disp: 200 each, Rfl: 1 .  acetaminophen (TYLENOL) 500 MG tablet, Take 2 tablets (1,000 mg total) by mouth every 6 (six) hours. (Patient taking differently: Take 1,000 mg every 6 (six) hours as needed by mouth. ), Disp: 65 tablet, Rfl:  1 .  albuterol (PROVENTIL HFA;VENTOLIN HFA) 108 (90 BASE) MCG/ACT inhaler, Inhale 2 puffs into the lungs every 6 (six) hours as needed for wheezing or shortness of breath. , Disp: , Rfl:  .  amLODipine (NORVASC) 5 MG tablet, Take by mouth., Disp: , Rfl:  .  Ascorbic Acid 500 MG CHEW, Chew by mouth., Disp: , Rfl:  .  aspirin EC 81 MG tablet, Take 81 mg by mouth every evening. , Disp: , Rfl:  .  atorvastatin (LIPITOR) 40 MG tablet, Take 0.5 tablets (20 mg total) by mouth daily at 6 PM., Disp: 90 tablet, Rfl: 3 .  azelastine (ASTELIN) 0.1 % nasal spray, Place 1 spray into both nostrils daily., Disp: , Rfl:  .  BESIVANCE 0.6 % SUSP, INSTILL 1 DROP INTO LEFT EYE THREE TIMES DAILY AS DIRECTED, Disp: , Rfl:  .  Biotin 5000  MCG TABS, Take by mouth 2 (two) times daily. , Disp: , Rfl:  .  Cetirizine HCl 10 MG CAPS, Take by mouth., Disp: , Rfl:  .  Cinnamon Bark POWD, Take by mouth., Disp: , Rfl:  .  COLLAGEN PO, Take by mouth 2 (two) times daily. For improved fingernail strength, Disp: , Rfl:  .  cyclobenzaprine (FLEXERIL) 10 MG tablet, Take 10 mg by mouth daily as needed for muscle spasms., Disp: , Rfl:  .  docusate sodium (COLACE) 100 MG capsule, Take 1 capsule (100 mg total) by mouth 2 (two) times daily., Disp: 20 capsule, Rfl: 0 .  DUREZOL 0.05 % EMUL, INSTILL 1 DROP INTO LEFT EYE THREE TIMES DAILY AS DIRECTED, Disp: , Rfl:  .  fish oil-omega-3 fatty acids 1000 MG capsule, Take 1 g by mouth every evening. , Disp: , Rfl:  .  fluticasone (FLONASE) 50 MCG/ACT nasal spray, Place 1 spray into both nostrils daily., Disp: 48 g, Rfl: 1 .  furosemide (LASIX) 20 MG tablet, TAKE 1 TABLET BY MOUTH  EVERY OTHER DAY, Disp: 45 tablet, Rfl: 3 .  glipiZIDE (GLUCOTROL XL) 5 MG 24 hr tablet, TAKE 1 TABLET BY MOUTH  DAILY, Disp: 90 tablet, Rfl: 3 .  insulin detemir (LEVEMIR) 100 UNIT/ML injection, Levemir U-100 Insulin 100 unit/mL subcutaneous solution, Disp: , Rfl:  .  JANUVIA 100 MG tablet, TAKE 1 TABLET BY MOUTH   DAILY, Disp: 90 tablet, Rfl: 3 .  Lancet Devices (ACCU-CHEK SOFTCLIX) lancets, Use to test blood sugars 1 -2 times daily, Disp: 1 each, Rfl: 3 .  LEVEMIR 100 UNIT/ML injection, INJECT 20 UNITS SUBCUTANEOUSLY AT BEDTIME (DISCARD AFTER 42 DAYS), Disp: 10 mL, Rfl: 0 .  loratadine (CLARITIN) 10 MG tablet, Take 10 mg by mouth daily., Disp: , Rfl:  .  losartan-hydrochlorothiazide (HYZAAR) 50-12.5 MG tablet, TAKE 1 TABLET BY MOUTH  DAILY, Disp: 90 tablet, Rfl: 3 .  Magnesium 250 MG TABS, Take 1 tablet by mouth daily., Disp: , Rfl:  .  meclizine (ANTIVERT) 25 MG tablet, Take 1 tablet (25 mg total) by mouth 3 (three) times daily as needed for dizziness., Disp: 30 tablet, Rfl: 3 .  metFORMIN (GLUCOPHAGE) 500 MG tablet, Take 1 tablet (500 mg total) by mouth 2 (two) times daily with a meal., Disp: 180 tablet, Rfl: 2 .  Multiple Vitamin (MULTIVITAMIN) tablet, Take 1 tablet by mouth daily., Disp: , Rfl:  .  nitroGLYCERIN (NITROSTAT) 0.4 MG SL tablet, Place 1 tablet (0.4 mg total) under the tongue every 5 (five) minutes as needed for chest pain., Disp: 50 tablet, Rfl: 3 .  NON FORMULARY, medroxy progesterone one 10mg  pill once daily, Disp: , Rfl:  .  pantoprazole (PROTONIX) 40 MG tablet, Take 1 tablet by mouth twice daily, Disp: 180 tablet, Rfl: 3 .  Polyethyl Glycol-Propyl Glycol (SYSTANE) 0.4-0.3 % SOLN, Apply to eye., Disp: , Rfl:  .  Potassium 99 MG TABS, Take 1 tablet by mouth daily., Disp: , Rfl:  .  potassium chloride SA (KLOR-CON) 20 MEQ tablet, Take 1 tablet (20 mEq total) by mouth every other day., Disp: 45 tablet, Rfl: 1 .  Probiotic Product (PROBIOTIC-10) CAPS, Take 1 capsule by mouth daily. , Disp: , Rfl:  .  PROLENSA 0.07 % SOLN, INSTILL 1 DROP INTO LEFT EYE AT BEDTIME AS DIRECTED, Disp: , Rfl:  .  sertraline (ZOLOFT) 50 MG tablet, TAKE 1 TABLET BY MOUTH  DAILY, Disp: 90 tablet, Rfl: 3 .  tiZANidine (ZANAFLEX) 4 MG tablet, tizanidine  4 mg tablet, Disp: , Rfl:  .  verapamil (VERELAN PM) 180 MG 24 hr  capsule, TAKE 1 CAPSULE BY MOUTH  DAILY, Disp: 90 capsule, Rfl: 3 .  vitamin B-12 (CYANOCOBALAMIN) 1000 MCG tablet, Take 1,000 mcg by mouth daily., Disp: , Rfl:  .  vitamin C (ASCORBIC ACID) 500 MG tablet, Take 500 mg by mouth daily., Disp: , Rfl:  .  Zinc 50 MG TABS, Take by mouth daily., Disp: , Rfl:   EXAM:   General impression: alert, cooperative and articulate.  No signs of being in distress  Lungs: speech is fluent sentence length suggests that patient is not short of breath and not punctuated by cough, sneezing or sniffing. Marland Kitchen   Psych: affect normal.  speech is articulate and non pressured . Thought processing intact.   ASSESSMENT AND PLAN:  Discussed the following assessment and plan:  Hypokalemia - Plan: Basic metabolic panel  Barrett's esophagus without dysplasia  Chronic venous insufficiency  Essential hypertension  Type 2 diabetes mellitus with diabetic nephropathy, with long-term current use of insulin (HCC)  Stage 3a chronic kidney disease  Barrett's esophagus Repeat EGD Nov 2018 was negative for dysplasia and malignant cell.s  Continue daily PPI  Chronic venous insufficiency She has been using lasix to manage edema.  Bur her GFT has declined.  Recommended use of compression knee highs and exercise instead of diuretic  Essential hypertension Home readings are at goal of 140/80 or less on amlodipine, losartan/hct, and verapamil , but the combination may be aggravating her edema .  Will consider cangig verapamil to metoprolol   DM (diabetes mellitus), type 2 with renal complications (HCC) Slight loss of control  on current medications (JANUVIA, metformin, Levemir and glipizide).  hemoglobin A1c is no longer  at goal of less than 7.0 . patient has no microalbuminuria by last check bt has decreased GFR that has been stable. . Patient is tolerating statin therapy for CAD risk reduction and on ACE/ARB for renal protection and hypertension . Given her weight gain, advised  to start waking for 30 minutes daily   Lab Results  Component Value Date   HGBA1C 7.3 (H) 10/11/2019   Lab Results  Component Value Date   MICROALBUR 1.3 10/11/2019   Lab Results  Component Value Date   CREATININE 1.24 (H) 10/11/2019     CKD (chronic kidney disease) stage 3, GFR 30-59 ml/min With recent drop in GFR attributed to double diuretics.  Stopping furosemide,  Repeat BMET on Monday . May need to change HTN regimen to reduce edema causing agents (amlodipine and verapamil)     I discussed the assessment and treatment plan with the patient. The patient was provided an opportunity to ask questions and all were answered. The patient agreed with the plan and demonstrated an understanding of the instructions.   The patient was advised to call back or seek an in-person evaluation if the symptoms worsen or if the condition fails to improve as anticipated.   I provided  25 minutes of non-face-to-face time during this encounter reviewing patient's current problems and past procedures/imaging studies, providing counseling on the above mentioned problems , and coordination  of care .   Crecencio Mc, MD

## 2019-10-17 NOTE — Assessment & Plan Note (Signed)
She has been using lasix to manage edema.  Bur her GFT has declined.  Recommended use of compression knee highs and exercise instead of diuretic

## 2019-10-17 NOTE — Assessment & Plan Note (Signed)
Home readings are at goal of 140/80 or less on amlodipine, losartan/hct, and verapamil , but the combination may be aggravating her edema .  Will consider cangig verapamil to metoprolol

## 2019-10-17 NOTE — Assessment & Plan Note (Signed)
With recent drop in GFR attributed to double diuretics.  Stopping furosemide,  Repeat BMET on Monday . May need to change HTN regimen to reduce edema causing agents (amlodipine and verapamil)

## 2019-10-17 NOTE — Assessment & Plan Note (Addendum)
Slight loss of control  on current medications (JANUVIA, metformin, Levemir and glipizide).  hemoglobin A1c is no longer  at goal of less than 7.0 . patient has no microalbuminuria by last check bt has decreased GFR that has been stable. . Patient is tolerating statin therapy for CAD risk reduction and on ACE/ARB for renal protection and hypertension . Given her weight gain, advised to start waking for 30 minutes daily   Lab Results  Component Value Date   HGBA1C 7.3 (H) 10/11/2019   Lab Results  Component Value Date   MICROALBUR 1.3 10/11/2019   Lab Results  Component Value Date   CREATININE 1.24 (H) 10/11/2019

## 2019-10-17 NOTE — Assessment & Plan Note (Signed)
Repeat EGD Nov 2018 was negative for dysplasia and malignant cell.s  Continue daily PPI

## 2019-10-21 ENCOUNTER — Other Ambulatory Visit: Payer: Self-pay

## 2019-10-21 ENCOUNTER — Other Ambulatory Visit (INDEPENDENT_AMBULATORY_CARE_PROVIDER_SITE_OTHER): Payer: Medicare PPO

## 2019-10-21 ENCOUNTER — Telehealth: Payer: Self-pay | Admitting: Internal Medicine

## 2019-10-21 DIAGNOSIS — E876 Hypokalemia: Secondary | ICD-10-CM | POA: Diagnosis not present

## 2019-10-21 NOTE — Telephone Encounter (Signed)
Patient dropped off BP readings. Readings are up front in color folder. 

## 2019-10-22 ENCOUNTER — Other Ambulatory Visit: Payer: Self-pay | Admitting: Urology

## 2019-10-22 DIAGNOSIS — R350 Frequency of micturition: Secondary | ICD-10-CM | POA: Diagnosis not present

## 2019-10-22 DIAGNOSIS — N3946 Mixed incontinence: Secondary | ICD-10-CM | POA: Diagnosis not present

## 2019-10-22 DIAGNOSIS — R3915 Urgency of urination: Secondary | ICD-10-CM | POA: Diagnosis not present

## 2019-10-22 LAB — BASIC METABOLIC PANEL
BUN: 22 mg/dL (ref 6–23)
CO2: 29 mEq/L (ref 19–32)
Calcium: 9.6 mg/dL (ref 8.4–10.5)
Chloride: 102 mEq/L (ref 96–112)
Creatinine, Ser: 0.98 mg/dL (ref 0.40–1.20)
GFR: 55.25 mL/min — ABNORMAL LOW (ref >60.00)
Glucose, Bld: 126 mg/dL — ABNORMAL HIGH (ref 70–99)
Potassium: 4.2 mEq/L (ref 3.5–5.1)
Sodium: 138 mEq/L (ref 135–145)

## 2019-10-22 NOTE — Telephone Encounter (Signed)
Blood pressures reviewed - range 120-130s/70-80s - ok.  Continue as she is doing.  Continue to spot check.  Hold readings for Dr Derrel Nip to review as well for FYI.

## 2019-10-22 NOTE — Telephone Encounter (Signed)
I have placed these in your results folder for review.

## 2019-10-23 NOTE — Telephone Encounter (Signed)
Spoke with pt and informed her that her blood pressure readings are in range and that she should continue her current medications and spot checking her blood pressure. I have placed the list of readings in yellow results folder.

## 2019-10-24 ENCOUNTER — Telehealth: Payer: Self-pay | Admitting: Internal Medicine

## 2019-10-24 MED ORDER — ACCU-CHEK AVIVA PLUS VI STRP: ORAL_STRIP | 0 refills | Status: DC

## 2019-10-24 NOTE — Telephone Encounter (Signed)
Test strips have been refilled and pt is aware.

## 2019-10-24 NOTE — Telephone Encounter (Signed)
Pt said all of her test strips are saying error in her machine. She said she needs a prescription called in to [facility] on [address] for Accu-Chek Aviva Plus test strips. She said that mail order would take to long so she asking if we could send them to [facility] because she needs them today.

## 2019-10-28 ENCOUNTER — Ambulatory Visit: Payer: Medicare Other | Admitting: Urology

## 2019-10-28 MED ORDER — BLOOD GLUCOSE METER KIT: PACK | 0 refills | Status: AC

## 2019-10-29 ENCOUNTER — Encounter: Payer: Self-pay | Admitting: *Deleted

## 2019-11-04 ENCOUNTER — Encounter: Payer: Self-pay | Admitting: Urology

## 2019-11-04 ENCOUNTER — Other Ambulatory Visit: Payer: Self-pay

## 2019-11-04 ENCOUNTER — Ambulatory Visit: Payer: Medicare PPO | Admitting: Urology

## 2019-11-04 VITALS — BP 143/76 | HR 72 | Ht 61.0 in | Wt 177.0 lb

## 2019-11-04 DIAGNOSIS — N3946 Mixed incontinence: Secondary | ICD-10-CM | POA: Diagnosis not present

## 2019-11-04 MED ORDER — MIRABEGRON ER 50 MG PO TB24: 50.0000 mg | ORAL_TABLET | Freq: Every day | ORAL | 11 refills | Status: DC

## 2019-11-04 NOTE — Progress Notes (Signed)
11/04/2019 8:49 AM   Sandra Hendrix Jun 21, 1946 [id number]  Referring provider: Crecencio Mc, MD Sandra Hendrix,  [place] [address]  Chief Complaint  Patient presents with  . Follow-up    HPI: I was consulted to assess the patient's urinary incontinence worsening over many months. She leaks with coughing sneezing and with bending and lifting of its heavy.  She can leak with walking  She has urge incontinence especially if she holds it long enough.  This is her most bothersome symptom in the morning.  No bedwetting.  2 pads a day moderately wet to soaked  Voids every 2-3 hours with no nocturia.   Grade 2 hypermobility the bladder neck and negative cough test.  Moderate atrophy.  Little to no prolapse  Patient has mixed incontinence.  She has mild frequency.    Today Frequency stable.  Incontinence stable. During urodynamics she emptied well after uroflow.  Maximum bladder capacity 504 mL.  Low pressure bladder overactivity felt as urgency but did not leak.  She said she likely would have been leaking as she was standing.  No stress incontinence with Valsalva pressure of 124 cm of water.  During voluntary voiding she voided 465 mL with a maximum of 12 mils per second.  Maximum voiding pressure 12 cm water.  Residual 30 mL.  EMG activity increased intermittently throughout voiding bladder neck descended 2 to 3 cm.  She had a depressed uroflow pattern.  She also voided with a interrupted pattern.  The details of the urodynamics are signed dictated   PMH: Past Medical History:  Diagnosis Date  . Allergy   . Anxiety   . Arthritis   . Barrett's esophagus determined by endoscopy   . Cancer [facility])    Uterine Cancer  . Chronic kidney disease   . Depression   . Diabetes mellitus without complication ([place])   . Difficult intubation    "not sure what happened,asleep was told had problem with intubation"  . Diverticulosis   . GERD (gastroesophageal reflux  disease)   . History of bronchitis   . History of hiatal hernia   . Hyperlipidemia   . Hypertension   . Sleep apnea    Use C- PAP  . Urinary incontinence   . Varicose veins     Surgical History: Past Surgical History:  Procedure Laterality Date  . ABDOMINAL HYSTERECTOMY  03/01/2017  . CARDIAC CATHETERIZATION    . CHOLECYSTECTOMY    . COLONOSCOPY WITH PROPOFOL N/A 06/14/2016   Procedure: COLONOSCOPY WITH PROPOFOL;  Surgeon: Sandra Sails, MD;  Location: [facility] ENDOSCOPY;  Service: Endoscopy;  Laterality: N/A;  . ESOPHAGOGASTRODUODENOSCOPY (EGD) WITH PROPOFOL N/A 06/14/2016   Procedure: ESOPHAGOGASTRODUODENOSCOPY (EGD) WITH PROPOFOL;  Surgeon: Sandra Sails, MD;  Location[facility] ENDOSCOPY;  Service: Endoscopy;  Laterality: N/A;  . ESOPHAGOGASTRODUODENOSCOPY (EGD) WITH PROPOFOL N/A 06/12/2017   Procedure: ESOPHAGOGASTRODUODENOSCOPY (EGD) WITH PROPOFOL;  Surgeon: Sandra Sails, MD;  Location: [facility] ENDOSCOPY;  Service: Endoscopy;  Laterality: N/A;  . ESOPHAGOGASTRODUODENOSCOPY (EGD) WITH PROPOFOL N/A 08/25/2017   Procedure: ESOPHAGOGASTRODUODENOSCOPY (EGD) WITH PROPOFOL;  Surgeon: Sandra Sails, MD;  Location: [facility] ENDOSCOPY;  Service: Endoscopy;  Laterality: N/A;  . HYSTEROSCOPY WITH D & C N/A 01/02/2017   Procedure: DILATATION AND CURETTAGE /HYSTEROSCOPY;  Surgeon: Sandra Mars, MD;  Location: [facility] ORS;  Service: Gynecology;  Laterality: N/A;  . JOINT REPLACEMENT Right 2007   Total Knee Replacement  . JOINT REPLACEMENT Left 2004   Total Knee Replacement  .  LAPAROSCOPIC HYSTERECTOMY Bilateral 03/01/2017   Procedure: HYSTERECTOMY TOTAL LAPAROSCOPIC BSO;  Surgeon: Sandra Drown, MD;  Location: [facility] ORS;  Service: Gynecology;  Laterality: Bilateral;  . ROTATOR CUFF REPAIR Right   . SENTINEL NODE BIOPSY N/A 03/01/2017   Procedure: SENTINEL NODE INJECTION AND BIOPSY;  Surgeon: Sandra Drown, MD;  Location: [facility] ORS;  Service: Gynecology;  Laterality: N/A;  .  TONSILLECTOMY     as a child  . TUBAL LIGATION    . UPPER GI ENDOSCOPY     x2    Home Medications:  Allergies as of 11/04/2019      Reactions   Clindamycin/lincomycin Rash   Codeine Rash   Lincomycin Rash   Morphine And Related Nausea Only   Talwin [pentazocine] Nausea And Vomiting   Lisinopril Cough   cough   Naloxone Other (See Comments)   Had issues with sedation and vomiting with EGD   Oxycodone-acetaminophen Other (See Comments)   Pt states this medication makes her bowels stop moving      Medication List       Accurate as of November 04, 2019  8:49 AM. If you have any questions, ask your nurse or doctor.        STOP taking these medications   Prolensa 0.07 % Soln Generic drug: Bromfenac Sodium Stopped by: Sandra Snare Riyansh Gerstner, MD   Systane 0.4-0.3 % Soln Generic drug: Polyethyl Glycol-Propyl Glycol Stopped by: Sandra Packer, MD     TAKE these medications   Accu-Chek Aviva Plus test strip Generic drug: glucose blood TEST TWO TIMES DAILY   accu-chek softclix lancets Use to test blood sugars 1 -2 times daily   acetaminophen 500 MG tablet Commonly known as: TYLENOL Take 2 tablets (1,000 mg total) by mouth every 6 (six) hours. What changed:   when to take this  reasons to take this   albuterol 108 (90 Base) MCG/ACT inhaler Commonly known as: VENTOLIN HFA Inhale 2 puffs into the lungs every 6 (six) hours as needed for wheezing or shortness of breath.   amLODipine 5 MG tablet Commonly known as: NORVASC Take by mouth.   aspirin EC 81 MG tablet Take 81 mg by mouth every evening.   atorvastatin 40 MG tablet Commonly known as: LIPITOR Take 0.5 tablets (20 mg total) by mouth daily at 6 PM.   azelastine 0.1 % nasal spray Commonly known as: ASTELIN Place 1 spray into both nostrils daily.   Besivance 0.6 % Susp Generic drug: Besifloxacin HCl INSTILL 1 DROP INTO LEFT EYE THREE TIMES DAILY AS DIRECTED   Biotin 5000 MCG Tabs Take by mouth 2 (two)  times daily.   blood glucose meter kit and supplies Use to check blood sugar twice daily. ICD 10: E11.29   Cetirizine HCl 10 MG Caps Take by mouth.   Cinnamon Bark Powd Take by mouth.   COLLAGEN PO Take by mouth 2 (two) times daily. For improved fingernail strength   cyclobenzaprine 10 MG tablet Commonly known as: FLEXERIL Take 10 mg by mouth daily as needed for muscle spasms.   docusate sodium 100 MG capsule Commonly known as: Colace Take 1 capsule (100 mg total) by mouth 2 (two) times daily.   Durezol 0.05 % Emul Generic drug: Difluprednate INSTILL 1 DROP INTO LEFT EYE THREE TIMES DAILY AS DIRECTED   fish oil-omega-3 fatty acids 1000 MG capsule Take 1 g by mouth every evening.   fluticasone 50 MCG/ACT nasal spray Commonly known as: FLONASE Place 1 spray into both nostrils  daily.   furosemide 20 MG tablet Commonly known as: LASIX TAKE 1 TABLET BY MOUTH  EVERY OTHER DAY   glipiZIDE 5 MG 24 hr tablet Commonly known as: GLUCOTROL XL TAKE 1 TABLET BY MOUTH  DAILY   Januvia 100 MG tablet Generic drug: sitaGLIPtin TAKE 1 TABLET BY MOUTH  DAILY   Levemir 100 UNIT/ML injection Generic drug: insulin detemir Levemir U-100 Insulin 100 unit/mL subcutaneous solution   Levemir 100 UNIT/ML injection Generic drug: insulin detemir INJECT 20 UNITS SUBCUTANEOUSLY AT BEDTIME (DISCARD AFTER 42 DAYS)   loratadine 10 MG tablet Commonly known as: CLARITIN Take 10 mg by mouth daily.   losartan-hydrochlorothiazide 50-12.5 MG tablet Commonly known as: HYZAAR TAKE 1 TABLET BY MOUTH  DAILY   Magnesium 250 MG Tabs Take 1 tablet by mouth daily.   meclizine 25 MG tablet Commonly known as: ANTIVERT Take 1 tablet (25 mg total) by mouth 3 (three) times daily as needed for dizziness.   metFORMIN 500 MG tablet Commonly known as: GLUCOPHAGE Take 1 tablet (500 mg total) by mouth 2 (two) times daily with a meal.   multivitamin tablet Take 1 tablet by mouth daily.   nitroGLYCERIN  0.4 MG SL tablet Commonly known as: NITROSTAT Place 1 tablet (0.4 mg total) under the tongue every 5 (five) minutes as needed for chest pain.   NON FORMULARY medroxy progesterone one 21m pill once daily   pantoprazole 40 MG tablet Commonly known as: PROTONIX Take 1 tablet by mouth twice daily   Potassium 99 MG Tabs Take 1 tablet by mouth daily.   potassium chloride SA 20 MEQ tablet Commonly known as: KLOR-CON Take 1 tablet (20 mEq total) by mouth every other day.   Probiotic-10 Caps Take 1 capsule by mouth daily.   sertraline 50 MG tablet Commonly known as: ZOLOFT TAKE 1 TABLET BY MOUTH  DAILY   tiZANidine 4 MG tablet Commonly known as: ZANAFLEX tizanidine 4 mg tablet   verapamil 180 MG 24 hr capsule Commonly known as: VERELAN PM TAKE 1 CAPSULE BY MOUTH  DAILY   vitamin B-12 1000 MCG tablet Commonly known as: CYANOCOBALAMIN Take 1,000 mcg by mouth daily.   vitamin C 500 MG tablet Commonly known as: ASCORBIC ACID Take 500 mg by mouth daily.   Ascorbic Acid 500 MG Chew Chew by mouth.   Zinc 50 MG Tabs Take by mouth daily.       Allergies:  Allergies  Allergen Reactions  . Clindamycin/Lincomycin Rash  . Codeine Rash  . Lincomycin Rash  . Morphine And Related Nausea Only  . Talwin [Pentazocine] Nausea And Vomiting  . Lisinopril Cough    cough  . Naloxone Other (See Comments)    Had issues with sedation and vomiting with EGD  . Oxycodone-Acetaminophen Other (See Comments)    Pt states this medication makes her bowels stop moving    Family History: Family History  Problem Relation Age of Onset  . Stroke Mother   . Hypertension Mother   . Lupus Mother   . Alcohol abuse Father   . Diabetes Father   . Cancer Father        gallbladder ca into liver   . Arthritis Sister   . Diabetes Sister   . Lumbar disc disease Sister   . Hyperlipidemia Daughter   . Hypertension Daughter   . Cancer Maternal Aunt        ovarian ca  . Diabetes Paternal Aunt    . Drug abuse Paternal Aunt   .  Cancer Maternal Grandfather        colon ca  . Hypertension Brother     Social History:  reports that she has never smoked. She has never used smokeless tobacco. She reports current alcohol use. She reports that she does not use drugs.  ROS: UROLOGY Frequent Urination?: Yes Hard to postpone urination?: No Burning/pain with urination?: No Get up at night to urinate?: No Leakage of urine?: No Urine stream starts and stops?: No Trouble starting stream?: No Do you have to strain to urinate?: No Blood in urine?: No Urinary tract infection?: No Sexually transmitted disease?: No Injury to kidneys or bladder?: No Painful intercourse?: No Weak stream?: No Currently pregnant?: No Vaginal bleeding?: No Last menstrual period?: n  Gastrointestinal Nausea?: No Vomiting?: No Indigestion/heartburn?: No Diarrhea?: No Constipation?: No  Constitutional Fever: No Night sweats?: No Weight loss?: No Fatigue?: No  Skin Skin rash/lesions?: No Itching?: No  Eyes Blurred vision?: No Double vision?: No  Ears/Nose/Throat Sore throat?: No Sinus problems?: No  Hematologic/Lymphatic Swollen glands?: No Easy bruising?: No  Cardiovascular Leg swelling?: No Chest pain?: No  Respiratory Cough?: No Shortness of breath?: No  Endocrine Excessive thirst?: No  Musculoskeletal Back pain?: No Joint pain?: No  Neurological Headaches?: No Dizziness?: No  Psychologic Depression?: No Anxiety?: No  Physical Exam: BP (!) 143/76   Pulse 72   Ht '5\' 1"'  (1.549 m)   Wt 177 lb (80.3 kg)   BMI 33.44 kg/m   Constitutional:  Alert and oriented, No acute distress.   Laboratory Data: Lab Results  Component Value Date   WBC 4.2 05/15/2017   HGB 12.0 05/15/2017   HCT 34.5 (L) 05/15/2017   MCV 88.0 05/15/2017   PLT 316 05/15/2017    Lab Results  Component Value Date   CREATININE 0.98 10/21/2019    No results found for: PSA  No results  found for: TESTOSTERONE  Lab Results  Component Value Date   HGBA1C 7.3 (H) 10/11/2019    Urinalysis    Component Value Date/Time   COLORURINE YELLOW 10/05/2016 1003   APPEARANCEUR Clear 09/30/2019 1257   LABSPEC 1.010 10/05/2016 1003   PHURINE 6.0 10/05/2016 1003   GLUCOSEU Negative 09/30/2019 1257   GLUCOSEU NEGATIVE 10/05/2016 1003   HGBUR NEGATIVE 10/05/2016 1003   BILIRUBINUR Negative 09/30/2019 1257   KETONESUR NEGATIVE 10/05/2016 1003   PROTEINUR Negative 09/30/2019 1257   PROTEINUR 30 (A) 02/20/2015 2137   UROBILINOGEN 0.2 05/26/2017 1122   UROBILINOGEN 0.2 10/05/2016 1003   NITRITE Negative 09/30/2019 1257   NITRITE NEGATIVE 10/05/2016 1003   LEUKOCYTESUR Negative 09/30/2019 1257    Pertinent Imaging:   Assessment & Plan: Patient is mixed incontinence.  She has very mild stress incontinence.  I will treat her with medical behavioral therapy.  If she does not reach her treatment goal I will have to reestablish her goals regarding stress incontinence.  A sling or urethral injectable are reasonable as it is a third line OAB therapy  Reassess of Myrbetriq 50 mg samples and a prescription in 6 weeks  There are no diagnoses linked to this encounter.  No follow-ups on file.  Sandra Packer, MD  [facility] [address] [place], [place] [address] [phone]

## 2019-11-04 NOTE — Addendum Note (Signed)
Addended by: Verlene Mayer A on: 11/04/2019 08:55 AM   Modules accepted: Orders

## 2019-11-21 ENCOUNTER — Ambulatory Visit (INDEPENDENT_AMBULATORY_CARE_PROVIDER_SITE_OTHER): Payer: Medicare PPO

## 2019-11-21 ENCOUNTER — Other Ambulatory Visit: Payer: Self-pay

## 2019-11-21 VITALS — Ht 61.0 in | Wt 177.0 lb

## 2019-11-21 DIAGNOSIS — Z0000 Encounter for general adult medical examination without abnormal findings: Secondary | ICD-10-CM | POA: Diagnosis not present

## 2019-11-21 NOTE — Patient Instructions (Addendum)
  Ms. Ertz , Thank you for taking time to come for your Medicare Wellness Visit. I appreciate your ongoing commitment to your health goals. Please review the following plan we discussed and let me know if I can assist you in the future.   These are the goals we discussed: Goals    . Increase physical activity     Stay active and use the exercise bike 3 days a week, 30 minutes. Stay hydrated.       This is a list of the screening recommended for you and due dates:  Health Maintenance  Topic Date Due  . Mammogram  02/27/2019  . Complete foot exam   09/28/2019  . Hemoglobin A1C  04/09/2020  . Eye exam for diabetics  04/14/2020  . Colon Cancer Screening  06/14/2021  . Tetanus Vaccine  01/18/2024  . Flu Shot  Completed  . DEXA scan (bone density measurement)  Completed  .  Hepatitis C: One time screening is recommended by Center for Disease Control  (CDC) for  adults born from [birth year] through 1967.   Completed  . Pneumonia vaccines  Completed

## 2019-11-21 NOTE — Progress Notes (Addendum)
Subjective:   Sandra Hendrix is a 74 y.o. female who presents for Medicare Annual (Subsequent) preventive examination.  Review of Systems:  No ROS.  Medicare Wellness Virtual Visit.  Visual/audio telehealth visit, UTA vital signs.   Ht/Wt provided.  See social history for additional risk factors.  Cardiac Risk Factors include: advanced age (>45mn, >>69women);diabetes mellitus;hypertension     Objective:     Vitals: Ht '5\' 1"'$  (1.549 m)   Wt 177 lb (80.3 kg)   BMI 33.44 kg/m   Body mass index is 33.44 kg/m.  Advanced Directives 11/21/2019 09/10/2019 05/23/2019 10/11/2018 09/05/2018 09/05/2018 05/17/2018  Does Patient Have a Medical Advance Directive? Yes Yes No Yes Yes Yes No  Type of Advance Directive [place] -  Does patient want to make changes to medical advance directive? No - Patient declined No - Patient declined - Yes (MAU/Ambulatory/Procedural Areas - Information given) No - Patient declined No - Patient declined -  Copy of [place]? - No - copy requested - - No - copy requested No - copy requested -  Would patient like information on creating a medical advance directive? - - No - Patient declined - - No - Patient declined No - Patient declined    Tobacco Social History   Tobacco Use  Smoking Status Never Smoker  Smokeless Tobacco Never Used     Counseling given: Not Answered   Clinical Intake:  Pre-visit preparation completed: Yes        Diabetes: Yes  How often do you need to have someone help you when you read instructions, pamphlets, or other written materials from your doctor or pharmacy?: 1 - Never  Interpreter Needed?: No     Past Medical History:  Diagnosis Date   Allergy    Anxiety    Arthritis    Barrett's esophagus determined by endoscopy    [place]    Uterine  Cancer   Chronic kidney disease    Depression    Diabetes mellitus without complication ([place]    Difficult intubation    "not sure what happened,asleep was told had problem with intubation"   Diverticulosis    GERD (gastroesophageal reflux disease)    History of bronchitis    History of hiatal hernia    Hyperlipidemia    Hypertension    Sleep apnea    Use C- PAP   Urinary incontinence    Varicose veins    Past Surgical History:  Procedure Laterality Date   ABDOMINAL HYSTERECTOMY  03/01/2017   CARDIAC CATHETERIZATION     CHOLECYSTECTOMY     COLONOSCOPY WITH PROPOFOL N/A 06/14/2016   Procedure: COLONOSCOPY WITH PROPOFOL;  Surgeon: MLollie Sails MD;  [facility];  Service: Endoscopy;  Laterality: N/A;   ESOPHAGOGASTRODUODENOSCOPY (EGD) WITH PROPOFOL N/A 06/14/2016   Procedure: ESOPHAGOGASTRODUODENOSCOPY (EGD) WITH PROPOFOL;  Surgeon: MLollie Sails MD;  [facility];  Service: Endoscopy;  Laterality: N/A;   ESOPHAGOGASTRODUODENOSCOPY (EGD) WITH PROPOFOL N/A 06/12/2017   Procedure: ESOPHAGOGASTRODUODENOSCOPY (EGD) WITH PROPOFOL;  Surgeon: SLollie Sails MD;  [facility];  Service: Endoscopy;  Laterality: N/A;   ESOPHAGOGASTRODUODENOSCOPY (EGD) WITH PROPOFOL N/A 08/25/2017   Procedure: ESOPHAGOGASTRODUODENOSCOPY (EGD) WITH PROPOFOL;  Surgeon: SLollie Sails MD;  [facility];  Service: Endoscopy;  Laterality: N/A;   HYSTEROSCOPY WITH D & C N/A 01/02/2017   Procedure:  DILATATION AND CURETTAGE /HYSTEROSCOPY;  Surgeon: Brayton Mars, MD;  Location: [facility];  Service: Gynecology;  Laterality: N/A;   JOINT REPLACEMENT Right 2007   Total Knee Replacement   JOINT REPLACEMENT Left 2004   Total Knee Replacement   LAPAROSCOPIC HYSTERECTOMY Bilateral 03/01/2017   Procedure: HYSTERECTOMY TOTAL LAPAROSCOPIC BSO;  Surgeon: Mellody Drown, MD;  Location: [facility];  Service: Gynecology;  Laterality: Bilateral;   ROTATOR CUFF  REPAIR Right    SENTINEL NODE BIOPSY N/A 03/01/2017   Procedure: SENTINEL NODE INJECTION AND BIOPSY;  Surgeon: Mellody Drown, MD;  Location: [facility];  Service: Gynecology;  Laterality: N/A;   TONSILLECTOMY     as a child   TUBAL LIGATION     UPPER GI ENDOSCOPY     x2   Family History  Problem Relation Age of Onset   Stroke Mother    Hypertension Mother    Lupus Mother    Alcohol abuse Father    Diabetes Father    Cancer Father        gallbladder ca into liver    Arthritis Sister    Diabetes Sister    Lumbar disc disease Sister    Hyperlipidemia Daughter    Hypertension Daughter    Cancer Maternal Aunt        ovarian ca   Diabetes Paternal Aunt    Drug abuse Paternal Aunt    Cancer Maternal Grandfather        colon ca   Hypertension Brother    Alcohol abuse Brother    Stroke Brother    Social History   Socioeconomic History   Marital status: Married    Spouse name: Not on file   Number of children: 2   Years of education: 14   Highest education level: Some college, no degree  Occupational History   Not on file  Tobacco Use   Smoking status: Never Smoker   Smokeless tobacco: Never Used  Substance and Sexual Activity   Alcohol use: Yes    Comment: rare   Drug use: No   Sexual activity: Yes    Comment: not often  Other Topics Concern   Not on file  Social History Narrative   Not on file   Social Determinants of Health   Financial Resource Strain:    Difficulty of Paying Living Expenses: Not on file  Food Insecurity:    Worried About Charity fundraiser in the Last Year: Not on file   [employer] of Food in the Last Year: Not on file  Transportation Needs:    Lack of Transportation (Medical): Not on file   Lack of Transportation (Non-Medical): Not on file  Physical Activity:    Days of Exercise per Week: Not on file   Minutes of Exercise per Session: Not on file  Stress:    Feeling of Stress : Not on file  Social Connections:    Frequency of  Communication with Friends and Family: Not on file   Frequency of Social Gatherings with Friends and Family: Not on file   Attends Religious Services: Not on file   Active Member of Clubs or Organizations: Not on file   Attends Club or Organization Meetings: Not on file   Marital Status: Not on file    Outpatient Encounter Medications as of 11/21/2019  Medication Sig   ACCU-CHEK AVIVA PLUS test strip TEST TWO TIMES DAILY   acetaminophen (TYLENOL) 500 MG tablet Take 2 tablets (1,000 mg total) by mouth  every 6 (six) hours. (Patient taking differently: Take 1,000 mg every 6 (six) hours as needed by mouth. )   albuterol (PROVENTIL HFA;VENTOLIN HFA) 108 (90 BASE) MCG/ACT inhaler Inhale 2 puffs into the lungs every 6 (six) hours as needed for wheezing or shortness of breath.    amLODipine (NORVASC) 5 MG tablet Take by mouth.   Ascorbic Acid 500 MG CHEW Chew by mouth.   aspirin EC 81 MG tablet Take 81 mg by mouth every evening.    atorvastatin (LIPITOR) 40 MG tablet Take 0.5 tablets (20 mg total) by mouth daily at 6 PM.   azelastine (ASTELIN) 0.1 % nasal spray Place 1 spray into both nostrils daily.   BESIVANCE 0.6 % SUSP INSTILL 1 DROP INTO LEFT EYE THREE TIMES DAILY AS DIRECTED   Biotin 5000 MCG TABS Take by mouth 2 (two) times daily.    blood glucose meter kit and supplies Use to check blood sugar twice daily. ICD 10: E11.29   Cetirizine HCl 10 MG CAPS Take by mouth.   Cinnamon Bark POWD Take by mouth.   COLLAGEN PO Take by mouth 2 (two) times daily. For improved fingernail strength   cyclobenzaprine (FLEXERIL) 10 MG tablet Take 10 mg by mouth daily as needed for muscle spasms.   docusate sodium (COLACE) 100 MG capsule Take 1 capsule (100 mg total) by mouth 2 (two) times daily.   DUREZOL 0.05 % EMUL INSTILL 1 DROP INTO LEFT EYE THREE TIMES DAILY AS DIRECTED   fish oil-omega-3 fatty acids 1000 MG capsule Take 1 g by mouth every evening.    fluticasone (FLONASE) 50 MCG/ACT nasal spray Place 1  spray into both nostrils daily.   furosemide (LASIX) 20 MG tablet TAKE 1 TABLET BY MOUTH  EVERY OTHER DAY   glipiZIDE (GLUCOTROL XL) 5 MG 24 hr tablet TAKE 1 TABLET BY MOUTH  DAILY   insulin detemir (LEVEMIR) 100 UNIT/ML injection Levemir U-100 Insulin 100 unit/mL subcutaneous solution   JANUVIA 100 MG tablet TAKE 1 TABLET BY MOUTH  DAILY   Lancet Devices (ACCU-CHEK SOFTCLIX) lancets Use to test blood sugars 1 -2 times daily   LEVEMIR 100 UNIT/ML injection INJECT 20 UNITS SUBCUTANEOUSLY AT BEDTIME (DISCARD AFTER 42 DAYS)   loratadine (CLARITIN) 10 MG tablet Take 10 mg by mouth daily.   losartan-hydrochlorothiazide (HYZAAR) 50-12.5 MG tablet TAKE 1 TABLET BY MOUTH  DAILY   Magnesium 250 MG TABS Take 1 tablet by mouth daily.   meclizine (ANTIVERT) 25 MG tablet Take 1 tablet (25 mg total) by mouth 3 (three) times daily as needed for dizziness.   metFORMIN (GLUCOPHAGE) 500 MG tablet Take 1 tablet (500 mg total) by mouth 2 (two) times daily with a meal.   mirabegron ER (MYRBETRIQ) 50 MG TB24 tablet Take 1 tablet (50 mg total) by mouth daily.   Multiple Vitamin (MULTIVITAMIN) tablet Take 1 tablet by mouth daily.   nitroGLYCERIN (NITROSTAT) 0.4 MG SL tablet Place 1 tablet (0.4 mg total) under the tongue every 5 (five) minutes as needed for chest pain.   NON FORMULARY medroxy progesterone one '10mg'$  pill once daily   pantoprazole (PROTONIX) 40 MG tablet Take 1 tablet by mouth twice daily   Potassium 99 MG TABS Take 1 tablet by mouth daily.   potassium chloride SA (KLOR-CON) 20 MEQ tablet Take 1 tablet (20 mEq total) by mouth every other day.   Probiotic Product (PROBIOTIC-10) CAPS Take 1 capsule by mouth daily.    sertraline (ZOLOFT) 50 MG tablet TAKE 1  TABLET BY MOUTH  DAILY   tiZANidine (ZANAFLEX) 4 MG tablet tizanidine 4 mg tablet   verapamil (VERELAN PM) 180 MG 24 hr capsule TAKE 1 CAPSULE BY MOUTH  DAILY   vitamin B-12 (CYANOCOBALAMIN) 1000 MCG tablet Take 1,000 mcg by mouth daily.   vitamin C  (ASCORBIC ACID) 500 MG tablet Take 500 mg by mouth daily.   Zinc 50 MG TABS Take by mouth daily.   No facility-administered encounter medications on file as of 11/21/2019.    Activities of Daily Living In your present state of health, do you have any difficulty performing the following activities: 11/21/2019  Hearing? Y  Comment Hearing aids  Vision? N  Difficulty concentrating or making decisions? N  Walking or climbing stairs? N  Dressing or bathing? N  Doing errands, shopping? N  Preparing Food and eating ? N  Using the Toilet? N  In the past six months, have you accidently leaked urine? N  Do you have problems with loss of bowel control? N  Managing your Medications? N  Managing your Finances? N  Housekeeping or managing your Housekeeping? N  Some recent data might be hidden    Patient Care Team: Crecencio Mc, MD as PCP - General (Internal Medicine) Clent Jacks, RN as Registered Nurse    Assessment:   This is a routine wellness examination for Sandra Hendrix.  Nurse connected with patient 11/21/19 at 12:30 PM EST by a telephone enabled telemedicine application and verified that I am speaking with the correct person using two identifiers. Patient stated full name and DOB. Patient gave permission to continue with virtual visit. Patient's location was at home and Nurse's location was at [place] office.   Patient is alert and oriented x3. Patient denies difficulty focusing or concentrating. Patient likes to read, attends Bible studies and crochets for brain stimulation.   Health Maintenance Due: -Foot Exam- denies any  -Hgb A1c- 10/11/19 (7.3) See completed HM at the end of note.   Eye: Visual acuity not assessed. Virtual visit. Followed by their ophthalmologist. Retinopathy- none reported.  Dental: Visits every 6 months.    Hearing: Hearing aids- yes  Safety:  Patient feels safe at home- yes Patient does have smoke detectors at home- yes Patient does wear  sunscreen or protective clothing when in direct sunlight - yes Patient does wear seat belt when in a moving vehicle - yes Patient drives- yes Adequate lighting in walkways free from debris- yes Grab bars and handrails used as appropriate- yes Ambulates with an assistive device- no Cell phone on person when ambulating outside of the home- yes  Social: Alcohol intake - yes      Smoking history- never   Smokers in home? none Illicit drug use? none  Medication: Taking as directed and without issues.  Pill box in use -yes  Self managed - yes   Covid-19: Precautions and sickness symptoms discussed. Wears mask, social distancing, hand hygiene as appropriate.   Activities of Daily Living Patient denies needing assistance with: household chores, feeding themselves, getting from bed to chair, getting to the toilet, bathing/showering, dressing, managing money, or preparing meals.   Discussed the importance of a healthy diet, water intake and the benefits of aerobic exercise.   Physical activity- no routine. Plans to start exercising at the gym after covid vaccines are completed.   Diet:  Low carb Water: good intake Caffeine: 1 cup in the morning  Other Providers Patient Care Team: Crecencio Mc, MD as PCP -  General (Internal Medicine) Clent Jacks, RN as Registered Nurse  Exercise Activities and Dietary recommendations Current Exercise Habits: The patient does not participate in regular exercise at present  Goals      Increase physical activity     Stay active and use the exercise bike 3 days a week, 30 minutes. Stay hydrated.        Fall Risk Fall Risk  11/21/2019 07/03/2019 09/27/2018 12/12/2017 06/28/2017  Falls in the past year? 0 0 1 No No  Number falls in past yr: - - 1 - -  Injury with Fall? - - 0 - -  Comment - - - - -  Risk for fall due to : - - - - -  Follow up Falls evaluation completed Falls evaluation completed - - -  Comment - - - - -   Timed Get Up  and Go performed: no, virtual visit  Depression Screen PHQ 2/9 Scores 11/21/2019 10/16/2019 09/27/2018 12/12/2017  PHQ - 2 Score 0 0 0 0  PHQ- 9 Score 0 0 - -     Cognitive Function MMSE - Mini Mental State Exam 12/12/2017 11/28/2016 10/26/2015  Orientation to time '5 5 5  '$ Orientation to Place '5 5 5  '$ Registration '3 3 3  '$ Attention/ Calculation '5 5 5  '$ Recall '3 3 3  '$ Language- name 2 objects '2 2 2  '$ Language- repeat '1 1 1  '$ Language- follow 3 step command '3 3 3  '$ Language- read & follow direction '1 1 1  '$ Write a sentence '1 1 1  '$ Copy design '1 1 1  '$ Total score '30 30 30     '$ 6CIT Screen 11/21/2019  What Year? 0 points  What month? 0 points  What time? 0 points  Count back from 20 0 points  Months in reverse 0 points  Repeat phrase 0 points  Total Score 0    Immunization History  Administered Date(s) Administered   Fluad Quad(high Dose 65+) 06/08/2019   Hep A / Hep B 08/20/2019, 08/20/2019, 09/17/2019   Influenza Split 05/21/2013, 06/06/2014, 05/28/2017   Influenza, High Dose Seasonal PF 05/29/2018   Influenza,inj,Quad PF,6+ Mos 06/05/2015, 06/09/2017   Influenza-Unspecified 06/19/2012, 06/14/2016, 04/26/2018   PFIZER SARS-COV-2 Vaccination 10/28/2019   Pneumococcal Conjugate-13 10/21/2013   Pneumococcal Polysaccharide-23 07/15/2004, 05/08/2010   Td 11/22/2003   Tdap 01/17/2014   Zoster 01/17/2014   Screening Tests Health Maintenance  Topic Date Due   MAMMOGRAM  02/27/2019   FOOT EXAM  09/28/2019   HEMOGLOBIN A1C  04/09/2020   OPHTHALMOLOGY EXAM  04/14/2020   COLONOSCOPY  06/14/2021   TETANUS/TDAP  01/18/2024   INFLUENZA VACCINE  Completed   DEXA SCAN  Completed   Hepatitis C Screening  Completed   PNA vac Low Risk Adult  Completed      Plan:   Keep all routine maintenance appointments.   Next scheduled lab 01/14/20 @ 9:15  Follow up 01/22/20 @ 9:00  Medicare Attestation I have personally reviewed: The patient's medical and social history Their use of alcohol, tobacco  or illicit drugs Their current medications and supplements The patient's functional ability including ADLs,fall risks, home safety risks, cognitive, and hearing and visual impairment Diet and physical activities Evidence for depression   I have reviewed and discussed with patient certain preventive protocols, quality metrics, and best practice recommendations.      OBrien-Blaney, Kian Ottaviano L, LPN  [id number]    I have reviewed the above information and agree with above.   Deborra Medina,  MD   

## 2019-11-26 ENCOUNTER — Other Ambulatory Visit: Payer: Self-pay | Admitting: Internal Medicine

## 2019-12-03 ENCOUNTER — Encounter: Payer: Medicare Other | Admitting: Obstetrics and Gynecology

## 2019-12-16 ENCOUNTER — Ambulatory Visit: Payer: Medicare PPO | Admitting: Urology

## 2019-12-16 ENCOUNTER — Encounter: Payer: Self-pay | Admitting: Urology

## 2019-12-16 ENCOUNTER — Other Ambulatory Visit: Payer: Self-pay

## 2019-12-16 VITALS — BP 135/83 | HR 77 | Ht 61.0 in | Wt 177.0 lb

## 2019-12-16 DIAGNOSIS — N3946 Mixed incontinence: Secondary | ICD-10-CM | POA: Diagnosis not present

## 2019-12-16 MED ORDER — OXYBUTYNIN CHLORIDE ER 10 MG PO TB24: 10.0000 mg | ORAL_TABLET | Freq: Every day | ORAL | 11 refills | Status: DC

## 2019-12-16 MED ORDER — SOLIFENACIN SUCCINATE 5 MG PO TABS: 5.0000 mg | ORAL_TABLET | Freq: Every day | ORAL | 11 refills | Status: DC

## 2019-12-16 NOTE — Progress Notes (Signed)
12/16/2019 8:57 AM   Sandra Hendrix 1946-06-11 [id number]  Referring provider: Crecencio Mc, MD [name],  [name] [id number]  Chief Complaint  Patient presents with  . Follow-up    HPI: I was consulted to assess the patient's urinary incontinence worsening over many months. She leaks with coughing sneezing and with bending and lifting of its heavy. She can leak with walking  She has urge incontinence especially if she holds it long enough.This is her most bothersome symptom in the morning. No bedwetting. 2 pads a day moderately wet to soaked  Voids every 2-3 hours with no nocturia.   Grade 2 hypermobility the bladder neck and negative cough test. Moderate atrophy. Little to no prolapse  Patient has mixed incontinence. She has mild frequency.  During urodynamics she emptied well after uroflow.  Maximum bladder capacity 504 mL.  Low pressure bladder overactivity felt as urgency but did not leak.  She said she likely would have been leaking as she was standing.  No stress incontinence with Valsalva pressure of 124 cm of water.  During voluntary voiding she voided 465 mL with a maximum of 12 mils per second.  Maximum voiding pressure 12 cm water.  Residual 30 mL.  EMG activity increased intermittently throughout voiding bladder neck descended 2 to 3 cm.  She had a depressed uroflow pattern.  She also voided with a interrupted pattern.    Patient is mixed incontinence.  She has very mild stress incontinence.  I will treat her with medical behavioral therapy.  If she does not reach her treatment goal I will have to reestablish her goals regarding stress incontinence.  A sling or urethral injectable are reasonable as it is a third line OAB therapy  Reassess of Myrbetriq 50 mg samples and a prescription in 6 weeks  Today To long and leaks.  The Myrbetriq helps some.  Frequency stable.  Clinically not infected  PMH: Past Medical History:   Diagnosis Date  . Allergy   . Anxiety   . Arthritis   . Barrett's esophagus determined by endoscopy   . Cancer [facility])    Uterine Cancer  . Chronic kidney disease   . Depression   . Diabetes mellitus without complication ([place])   . Difficult intubation    "not sure what happened,asleep was told had problem with intubation"  . Diverticulosis   . GERD (gastroesophageal reflux disease)   . History of bronchitis   . History of hiatal hernia   . Hyperlipidemia   . Hypertension   . Sleep apnea    Use C- PAP  . Urinary incontinence   . Varicose veins     Surgical History: Past Surgical History:  Procedure Laterality Date  . ABDOMINAL HYSTERECTOMY  03/01/2017  . CARDIAC CATHETERIZATION    . CHOLECYSTECTOMY    . COLONOSCOPY WITH PROPOFOL N/A 06/14/2016   Procedure: COLONOSCOPY WITH PROPOFOL;  Surgeon: Sandra Sails, MD;  Location[facility] ENDOSCOPY;  Service: Endoscopy;  Laterality: N/A;  . ESOPHAGOGASTRODUODENOSCOPY (EGD) WITH PROPOFOL N/A 06/14/2016   Procedure: ESOPHAGOGASTRODUODENOSCOPY (EGD) WITH PROPOFOL;  Surgeon: Sandra Sails, MD;  Location: [facility] ENDOSCOPY;  Service: Endoscopy;  Laterality: N/A;  . ESOPHAGOGASTRODUODENOSCOPY (EGD) WITH PROPOFOL N/A 06/12/2017   Procedure: ESOPHAGOGASTRODUODENOSCOPY (EGD) WITH PROPOFOL;  Surgeon: Sandra Sails, MD;  Location: [facility] ENDOSCOPY;  Service: Endoscopy;  Laterality: N/A;  . ESOPHAGOGASTRODUODENOSCOPY (EGD) WITH PROPOFOL N/A 08/25/2017   Procedure: ESOPHAGOGASTRODUODENOSCOPY (EGD) WITH PROPOFOL;  Surgeon: Sandra Sails, MD;  Location: [facility] ENDOSCOPY;  Service: Endoscopy;  Laterality: N/A;  . HYSTEROSCOPY WITH D & C N/A 01/02/2017   Procedure: DILATATION AND CURETTAGE /HYSTEROSCOPY;  Surgeon: Sandra Mars, MD;  Location: [facility] ORS;  Service: Gynecology;  Laterality: N/A;  . JOINT REPLACEMENT Right 2007   Total Knee Replacement  . JOINT REPLACEMENT Left 2004   Total Knee Replacement  . LAPAROSCOPIC HYSTERECTOMY  Bilateral 03/01/2017   Procedure: HYSTERECTOMY TOTAL LAPAROSCOPIC BSO;  Surgeon: Sandra Drown, MD;  Location: [facility] ORS;  Service: Gynecology;  Laterality: Bilateral;  . ROTATOR CUFF REPAIR Right   . SENTINEL NODE BIOPSY N/A 03/01/2017   Procedure: SENTINEL NODE INJECTION AND BIOPSY;  Surgeon: Sandra Drown, MD;  Location: [facility] ORS;  Service: Gynecology;  Laterality: N/A;  . TONSILLECTOMY     as a child  . TUBAL LIGATION    . UPPER GI ENDOSCOPY     x2    Home Medications:  Allergies as of 12/16/2019      Reactions   Clindamycin/lincomycin Rash   Codeine Rash   Lincomycin Rash   Morphine And Related Nausea Only   Talwin [pentazocine] Nausea And Vomiting   Lisinopril Cough   cough   Naloxone Other (See Comments)   Had issues with sedation and vomiting with EGD   Oxycodone-acetaminophen Other (See Comments)   Pt states this medication makes her bowels stop moving      Medication List       Accurate as of December 16, 2019  8:57 AM. If you have any questions, ask your nurse or doctor.        Accu-Chek Aviva Plus test strip Generic drug: glucose blood TEST TWO TIMES DAILY   accu-chek softclix lancets Use to test blood sugars 1 -2 times daily   acetaminophen 500 MG tablet Commonly known as: TYLENOL Take 2 tablets (1,000 mg total) by mouth every 6 (six) hours. What changed:   when to take this  reasons to take this   albuterol 108 (90 Base) MCG/ACT inhaler Commonly known as: VENTOLIN HFA Inhale 2 puffs into the lungs every 6 (six) hours as needed for wheezing or shortness of breath.   amLODipine 5 MG tablet Commonly known as: NORVASC Take by mouth.   aspirin EC 81 MG tablet Take 81 mg by mouth every evening.   atorvastatin 40 MG tablet Commonly known as: LIPITOR Take 0.5 tablets (20 mg total) by mouth daily at 6 PM.   azelastine 0.1 % nasal spray Commonly known as: ASTELIN Place 1 spray into both nostrils daily.   Besivance 0.6 % Susp Generic drug:  Besifloxacin HCl INSTILL 1 DROP INTO LEFT EYE THREE TIMES DAILY AS DIRECTED   Biotin 5000 MCG Tabs Take by mouth 2 (two) times daily.   blood glucose meter kit and supplies Use to check blood sugar twice daily. ICD 10: E11.29   Cetirizine HCl 10 MG Caps Take by mouth.   Cinnamon Bark Powd Take by mouth.   COLLAGEN PO Take by mouth 2 (two) times daily. For improved fingernail strength   cyclobenzaprine 10 MG tablet Commonly known as: FLEXERIL Take 10 mg by mouth daily as needed for muscle spasms.   docusate sodium 100 MG capsule Commonly known as: Colace Take 1 capsule (100 mg total) by mouth 2 (two) times daily.   Durezol 0.05 % Emul Generic drug: Difluprednate INSTILL 1 DROP INTO LEFT EYE THREE TIMES DAILY AS DIRECTED   fish oil-omega-3 fatty acids 1000 MG capsule Take 1 g by mouth  every evening.   fluticasone 50 MCG/ACT nasal spray Commonly known as: FLONASE Place 1 spray into both nostrils daily.   furosemide 20 MG tablet Commonly known as: LASIX TAKE 1 TABLET BY MOUTH  EVERY OTHER DAY   glipiZIDE 5 MG 24 hr tablet Commonly known as: GLUCOTROL XL TAKE 1 TABLET BY MOUTH  DAILY   Januvia 100 MG tablet Generic drug: sitaGLIPtin TAKE 1 TABLET BY MOUTH  DAILY   Levemir 100 UNIT/ML injection Generic drug: insulin detemir Levemir U-100 Insulin 100 unit/mL subcutaneous solution   Levemir 100 UNIT/ML injection Generic drug: insulin detemir INJECT 20 UNITS SUBCUTANEOUSLY AT BEDTIME   loratadine 10 MG tablet Commonly known as: CLARITIN Take 10 mg by mouth daily.   losartan-hydrochlorothiazide 50-12.5 MG tablet Commonly known as: HYZAAR TAKE 1 TABLET BY MOUTH  DAILY   Magnesium 250 MG Tabs Take 1 tablet by mouth daily.   meclizine 25 MG tablet Commonly known as: ANTIVERT Take 1 tablet (25 mg total) by mouth 3 (three) times daily as needed for dizziness.   metFORMIN 500 MG tablet Commonly known as: GLUCOPHAGE Take 1 tablet (500 mg total) by mouth 2  (two) times daily with a meal.   mirabegron ER 50 MG Tb24 tablet Commonly known as: MYRBETRIQ Take 1 tablet (50 mg total) by mouth daily.   multivitamin tablet Take 1 tablet by mouth daily.   nitroGLYCERIN 0.4 MG SL tablet Commonly known as: NITROSTAT Place 1 tablet (0.4 mg total) under the tongue every 5 (five) minutes as needed for chest pain.   NON FORMULARY medroxy progesterone one 54m pill once daily   pantoprazole 40 MG tablet Commonly known as: PROTONIX Take 1 tablet by mouth twice daily   Potassium 99 MG Tabs Take 1 tablet by mouth daily.   potassium chloride SA 20 MEQ tablet Commonly known as: KLOR-CON Take 1 tablet (20 mEq total) by mouth every other day.   Probiotic-10 Caps Take 1 capsule by mouth daily.   sertraline 50 MG tablet Commonly known as: ZOLOFT TAKE 1 TABLET BY MOUTH  DAILY   tiZANidine 4 MG tablet Commonly known as: ZANAFLEX tizanidine 4 mg tablet   verapamil 180 MG 24 hr capsule Commonly known as: VERELAN PM TAKE 1 CAPSULE BY MOUTH  DAILY   vitamin B-12 1000 MCG tablet Commonly known as: CYANOCOBALAMIN Take 1,000 mcg by mouth daily.   vitamin C 500 MG tablet Commonly known as: ASCORBIC ACID Take 500 mg by mouth daily.   Ascorbic Acid 500 MG Chew Chew by mouth.   Zinc 50 MG Tabs Take by mouth daily.       Allergies:  Allergies  Allergen Reactions  . Clindamycin/Lincomycin Rash  . Codeine Rash  . Lincomycin Rash  . Morphine And Related Nausea Only  . Talwin [Pentazocine] Nausea And Vomiting  . Lisinopril Cough    cough  . Naloxone Other (See Comments)    Had issues with sedation and vomiting with EGD  . Oxycodone-Acetaminophen Other (See Comments)    Pt states this medication makes her bowels stop moving    Family History: Family History  Problem Relation Age of Onset  . Stroke Mother   . Hypertension Mother   . Lupus Mother   . Alcohol abuse Father   . Diabetes Father   . Cancer Father        gallbladder ca  into liver   . Arthritis Sister   . Diabetes Sister   . Lumbar disc disease Sister   .  Hyperlipidemia Daughter   . Hypertension Daughter   . Cancer Maternal Aunt        ovarian ca  . Diabetes Paternal Aunt   . Drug abuse Paternal Aunt   . Cancer Maternal Grandfather        colon ca  . Hypertension Brother   . Alcohol abuse Brother   . Stroke Brother     Social History:  reports that she has never smoked. She has never used smokeless tobacco. She reports current alcohol use. She reports that she does not use drugs.  ROS:                                        Physical Exam: BP 135/83   Pulse 77   Ht _0  (1.549 m)   Wt 80.3 kg   BMI 33.44 kg/m   Constitutional:  Alert and oriented, No acute distress.   Laboratory Data: Lab Results  Component Value Date   WBC 4.2 05/15/2017   HGB 12.0 05/15/2017   HCT 34.5 (L) 05/15/2017   MCV 88.0 05/15/2017   PLT 316 05/15/2017    Lab Results  Component Value Date   CREATININE 0.98 10/21/2019    No results found for: PSA  No results found for: TESTOSTERONE  Lab Results  Component Value Date   HGBA1C 7.3 (H) 10/11/2019    Urinalysis    Component Value Date/Time   COLORURINE YELLOW 10/05/2016 1003   APPEARANCEUR Clear 09/30/2019 1257   LABSPEC 1.010 10/05/2016 1003   PHURINE 6.0 10/05/2016 1003   GLUCOSEU Negative 09/30/2019 1257   GLUCOSEU NEGATIVE 10/05/2016 1003   HGBUR NEGATIVE 10/05/2016 1003   BILIRUBINUR Negative 09/30/2019 1257   KETONESUR NEGATIVE 10/05/2016 1003   PROTEINUR Negative 09/30/2019 1257   PROTEINUR 30 (A) 02/20/2015 2137   UROBILINOGEN 0.2 05/26/2017 1122   UROBILINOGEN 0.2 10/05/2016 1003   NITRITE Negative 09/30/2019 1257   NITRITE NEGATIVE 10/05/2016 1003   LEUKOCYTESUR Negative 09/30/2019 1257    Pertinent Imaging:   Assessment & Plan: Oxybutynin ER 10 mg and Vesicare 5 mg prescriptions given.  Reevaluate 8 or 9 weeks  There are no diagnoses linked to  this encounter.  No follow-ups on file.  Reece Packer, MD  [place] [address] [place], [place] [address] [phone]

## 2019-12-18 ENCOUNTER — Other Ambulatory Visit: Payer: Self-pay

## 2019-12-18 MED ORDER — GLIPIZIDE ER 5 MG PO TB24: 5.0000 mg | ORAL_TABLET | Freq: Every day | ORAL | 0 refills | Status: DC

## 2019-12-18 MED ORDER — VERAPAMIL HCL ER 180 MG PO CP24: 180.0000 mg | ORAL_CAPSULE | Freq: Every day | ORAL | 0 refills | Status: DC

## 2019-12-18 MED ORDER — FUROSEMIDE 20 MG PO TABS: 20.0000 mg | ORAL_TABLET | ORAL | 0 refills | Status: DC

## 2019-12-18 MED ORDER — SERTRALINE HCL 50 MG PO TABS: 50.0000 mg | ORAL_TABLET | Freq: Every day | ORAL | 0 refills | Status: DC

## 2019-12-20 DIAGNOSIS — N1831 Chronic kidney disease, stage 3a: Secondary | ICD-10-CM | POA: Diagnosis not present

## 2019-12-20 DIAGNOSIS — I739 Peripheral vascular disease, unspecified: Secondary | ICD-10-CM | POA: Diagnosis not present

## 2019-12-20 DIAGNOSIS — E785 Hyperlipidemia, unspecified: Secondary | ICD-10-CM | POA: Diagnosis not present

## 2019-12-20 DIAGNOSIS — I10 Essential (primary) hypertension: Secondary | ICD-10-CM | POA: Diagnosis not present

## 2019-12-20 DIAGNOSIS — R609 Edema, unspecified: Secondary | ICD-10-CM | POA: Diagnosis not present

## 2019-12-20 DIAGNOSIS — Z794 Long term (current) use of insulin: Secondary | ICD-10-CM | POA: Diagnosis not present

## 2019-12-20 DIAGNOSIS — F419 Anxiety disorder, unspecified: Secondary | ICD-10-CM | POA: Diagnosis not present

## 2019-12-20 DIAGNOSIS — R42 Dizziness and giddiness: Secondary | ICD-10-CM | POA: Diagnosis not present

## 2019-12-20 DIAGNOSIS — Z8249 Family history of ischemic heart disease and other diseases of the circulatory system: Secondary | ICD-10-CM | POA: Diagnosis not present

## 2019-12-20 DIAGNOSIS — Z79899 Other long term (current) drug therapy: Secondary | ICD-10-CM | POA: Diagnosis not present

## 2019-12-20 DIAGNOSIS — M810 Age-related osteoporosis without current pathological fracture: Secondary | ICD-10-CM | POA: Diagnosis not present

## 2019-12-20 DIAGNOSIS — R600 Localized edema: Secondary | ICD-10-CM | POA: Diagnosis not present

## 2019-12-20 DIAGNOSIS — E1140 Type 2 diabetes mellitus with diabetic neuropathy, unspecified: Secondary | ICD-10-CM | POA: Diagnosis not present

## 2019-12-20 DIAGNOSIS — R32 Unspecified urinary incontinence: Secondary | ICD-10-CM | POA: Diagnosis not present

## 2019-12-20 DIAGNOSIS — G4730 Sleep apnea, unspecified: Secondary | ICD-10-CM | POA: Diagnosis not present

## 2019-12-20 DIAGNOSIS — E669 Obesity, unspecified: Secondary | ICD-10-CM | POA: Diagnosis not present

## 2019-12-20 DIAGNOSIS — M62838 Other muscle spasm: Secondary | ICD-10-CM | POA: Diagnosis not present

## 2019-12-20 DIAGNOSIS — E1122 Type 2 diabetes mellitus with diabetic chronic kidney disease: Secondary | ICD-10-CM | POA: Diagnosis not present

## 2019-12-20 DIAGNOSIS — Z833 Family history of diabetes mellitus: Secondary | ICD-10-CM | POA: Diagnosis not present

## 2019-12-20 DIAGNOSIS — M1990 Unspecified osteoarthritis, unspecified site: Secondary | ICD-10-CM | POA: Diagnosis not present

## 2019-12-20 DIAGNOSIS — Z7982 Long term (current) use of aspirin: Secondary | ICD-10-CM | POA: Diagnosis not present

## 2019-12-20 DIAGNOSIS — I25119 Atherosclerotic heart disease of native coronary artery with unspecified angina pectoris: Secondary | ICD-10-CM | POA: Diagnosis not present

## 2019-12-20 DIAGNOSIS — Z881 Allergy status to other antibiotic agents status: Secondary | ICD-10-CM | POA: Diagnosis not present

## 2019-12-20 DIAGNOSIS — Z7722 Contact with and (suspected) exposure to environmental tobacco smoke (acute) (chronic): Secondary | ICD-10-CM | POA: Diagnosis not present

## 2019-12-20 DIAGNOSIS — Z8542 Personal history of malignant neoplasm of other parts of uterus: Secondary | ICD-10-CM | POA: Diagnosis not present

## 2019-12-20 DIAGNOSIS — Z818 Family history of other mental and behavioral disorders: Secondary | ICD-10-CM | POA: Diagnosis not present

## 2019-12-20 DIAGNOSIS — E876 Hypokalemia: Secondary | ICD-10-CM | POA: Diagnosis not present

## 2019-12-23 DIAGNOSIS — Z03818 Encounter for observation for suspected exposure to other biological agents ruled out: Secondary | ICD-10-CM | POA: Diagnosis not present

## 2019-12-23 DIAGNOSIS — J40 Bronchitis, not specified as acute or chronic: Secondary | ICD-10-CM | POA: Diagnosis not present

## 2020-01-01 ENCOUNTER — Ambulatory Visit (INDEPENDENT_AMBULATORY_CARE_PROVIDER_SITE_OTHER): Payer: Medicare PPO | Admitting: Obstetrics and Gynecology

## 2020-01-01 ENCOUNTER — Encounter: Payer: Self-pay | Admitting: Obstetrics and Gynecology

## 2020-01-01 ENCOUNTER — Other Ambulatory Visit: Payer: Self-pay

## 2020-01-01 VITALS — BP 119/70 | HR 75 | Ht 61.0 in | Wt 180.0 lb

## 2020-01-01 DIAGNOSIS — G5722 Lesion of femoral nerve, left lower limb: Secondary | ICD-10-CM

## 2020-01-01 DIAGNOSIS — Z780 Asymptomatic menopausal state: Secondary | ICD-10-CM

## 2020-01-01 DIAGNOSIS — Z01419 Encounter for gynecological examination (general) (routine) without abnormal findings: Secondary | ICD-10-CM | POA: Diagnosis not present

## 2020-01-01 DIAGNOSIS — N3941 Urge incontinence: Secondary | ICD-10-CM

## 2020-01-01 DIAGNOSIS — E669 Obesity, unspecified: Secondary | ICD-10-CM | POA: Diagnosis not present

## 2020-01-01 DIAGNOSIS — C55 Malignant neoplasm of uterus, part unspecified: Secondary | ICD-10-CM

## 2020-01-01 NOTE — Patient Instructions (Signed)
Health Maintenance for Postmenopausal Women Menopause is a normal process in which your ability to get pregnant comes to an end. This process happens slowly over many months or years, usually between the ages of 48 and 55. Menopause is complete when you have missed your menstrual periods for 12 months. It is important to talk with your health care provider about some of the most common conditions that affect women after menopause (postmenopausal women). These include heart disease, cancer, and bone loss (osteoporosis). Adopting a healthy lifestyle and getting preventive care can help to promote your health and wellness. The actions you take can also lower your chances of developing some of these common conditions. What should I know about menopause? During menopause, you may get a number of symptoms, such as:  Hot flashes. These can be moderate or severe.  Night sweats.  Decrease in sex drive.  Mood swings.  Headaches.  Tiredness.  Irritability.  Memory problems.  Insomnia. Choosing to treat or not to treat these symptoms is a decision that you make with your health care provider. Do I need hormone replacement therapy?  Hormone replacement therapy is effective in treating symptoms that are caused by menopause, such as hot flashes and night sweats.  Hormone replacement carries certain risks, especially as you become older. If you are thinking about using estrogen or estrogen with progestin, discuss the benefits and risks with your health care provider. What is my risk for heart disease and stroke? The risk of heart disease, heart attack, and stroke increases as you age. One of the causes may be a change in the body's hormones during menopause. This can affect how your body uses dietary fats, triglycerides, and cholesterol. Heart attack and stroke are medical emergencies. There are many things that you can do to help prevent heart disease and stroke. Watch your blood pressure  High  blood pressure causes heart disease and increases the risk of stroke. This is more likely to develop in people who have high blood pressure readings, are of African descent, or are overweight.  Have your blood pressure checked: ? Every 3-5 years if you are 18-39 years of age. ? Every year if you are 40 years old or older. Eat a healthy diet   Eat a diet that includes plenty of vegetables, fruits, low-fat dairy products, and lean protein.  Do not eat a lot of foods that are high in solid fats, added sugars, or sodium. Get regular exercise Get regular exercise. This is one of the most important things you can do for your health. Most adults should:  Try to exercise for at least 150 minutes each week. The exercise should increase your heart rate and make you sweat (moderate-intensity exercise).  Try to do strengthening exercises at least twice each week. Do these in addition to the moderate-intensity exercise.  Spend less time sitting. Even light physical activity can be beneficial. Other tips  Work with your health care provider to achieve or maintain a healthy weight.  Do not use any products that contain nicotine or tobacco, such as cigarettes, e-cigarettes, and chewing tobacco. If you need help quitting, ask your health care provider.  Know your numbers. Ask your health care provider to check your cholesterol and your blood sugar (glucose). Continue to have your blood tested as directed by your health care provider. Do I need screening for cancer? Depending on your health history and family history, you may need to have cancer screening at different stages of your life. This   may include screening for:  Breast cancer.  Cervical cancer.  Lung cancer.  Colorectal cancer. What is my risk for osteoporosis? After menopause, you may be at increased risk for osteoporosis. Osteoporosis is a condition in which bone destruction happens more quickly than new bone creation. To help prevent  osteoporosis or the bone fractures that can happen because of osteoporosis, you may take the following actions:  If you are 67-36 years old, get at least 1,000 mg of calcium and at least 600 mg of vitamin D per day.  If you are older than age 38 but younger than age 80, get at least 1,200 mg of calcium and at least 600 mg of vitamin D per day.  If you are older than age 22, get at least 1,200 mg of calcium and at least 800 mg of vitamin D per day. Smoking and drinking excessive alcohol increase the risk of osteoporosis. Eat foods that are rich in calcium and vitamin D, and do weight-bearing exercises several times each week as directed by your health care provider. How does menopause affect my mental health? Depression may occur at any age, but it is more common as you become older. Common symptoms of depression include:  Low or sad mood.  Changes in sleep patterns.  Changes in appetite or eating patterns.  Feeling an overall lack of motivation or enjoyment of activities that you previously enjoyed.  Frequent crying spells. Talk with your health care provider if you think that you are experiencing depression. General instructions See your health care provider for regular wellness exams and vaccines. This may include:  Scheduling regular health, dental, and eye exams.  Getting and maintaining your vaccines. These include: ? Influenza vaccine. Get this vaccine each year before the flu season begins. ? Pneumonia vaccine. ? Shingles vaccine. ? Tetanus, diphtheria, and pertussis (Tdap) booster vaccine. Your health care provider may also recommend other immunizations. Tell your health care provider if you have ever been abused or do not feel safe at home. Summary  Menopause is a normal process in which your ability to get pregnant comes to an end.  This condition causes hot flashes, night sweats, decreased interest in sex, mood swings, headaches, or lack of sleep.  Treatment for this  condition may include hormone replacement therapy.  Take actions to keep yourself healthy, including exercising regularly, eating a healthy diet, watching your weight, and checking your blood pressure and blood sugar levels.  Get screened for cancer and depression. Make sure that you are up to date with all your vaccines. This information is not intended to replace advice given to you by your health care provider. Make sure you discuss any questions you have with your health care provider. Document Revised: 09/05/2018 Document Reviewed: 09/05/2018 Elsevier Patient Education  2020 [place].   Neuropathic Pain Neuropathic pain is pain caused by damage to the nerves that are responsible for certain sensations in your body (sensory nerves). The pain can be caused by:  Damage to the sensory nerves that send signals to your spinal cord and brain (peripheral nervous system).  Damage to the sensory nerves in your brain or spinal cord (central nervous system). Neuropathic pain can make you more sensitive to pain. Even a minor sensation can feel very painful. This is usually a long-term condition that can be difficult to treat. The type of pain differs from person to person. It may:  Start suddenly (acute), or it may develop slowly and last for a long time (chronic).  Come and go as damaged nerves heal, or it may stay at the same level for years.  Cause emotional distress, loss of sleep, and a lower quality of life. What are the causes? The most common cause of this condition is diabetes. Many other diseases and conditions can also cause neuropathic pain. Causes of neuropathic pain can be classified as:  Toxic. This is caused by medicines and chemicals. The most common cause of toxic neuropathic pain is damage from cancer treatments (chemotherapy).  Metabolic. This can be caused by: ? Diabetes. This is the most common disease that damages the nerves. ? Lack of vitamin B from long-term  alcohol abuse.  Traumatic. Any injury that cuts, crushes, or stretches a nerve can cause damage and pain. A common example is feeling pain after losing an arm or leg (phantom limb pain).  Compression-related. If a sensory nerve gets trapped or compressed for a long period of time, the blood supply to the nerve can be cut off.  Vascular. Many blood vessel diseases can cause neuropathic pain by decreasing blood supply and oxygen to nerves.  Autoimmune. This type of pain results from diseases in which the body's defense system (immune system) mistakenly attacks sensory nerves. Examples of autoimmune diseases that can cause neuropathic pain include lupus and multiple sclerosis.  Infectious. Many types of viral infections can damage sensory nerves and cause pain. Shingles infection is a common cause of this type of pain.  Inherited. Neuropathic pain can be a symptom of many diseases that are passed down through families (genetic). What increases the risk? You are more likely to develop this condition if:  You have diabetes.  You smoke.  You drink too much alcohol.  You are taking certain medicines, including medicines that kill cancer cells (chemotherapy) or that treat immune system disorders. What are the signs or symptoms? The main symptom is pain. Neuropathic pain is often described as:  Burning.  Shock-like.  Stinging.  Hot or cold.  Itching. How is this diagnosed? No single test can diagnose neuropathic pain. It is diagnosed based on:  Physical exam and your symptoms. Your health care provider will ask you about your pain. You may be asked to use a pain scale to describe how bad your pain is.  Tests. These may be done to see if you have a high sensitivity to pain and to help find the cause and location of any sensory nerve damage. They include: ? Nerve conduction studies to test how well nerve signals travel through your sensory nerves (electrodiagnostic  testing). ? Stimulating your sensory nerves through electrodes on your skin and measuring the response in your spinal cord and brain (somatosensory evoked potential).  Imaging studies, such as: ? X-rays. ? CT scan. ? MRI. How is this treated? Treatment for neuropathic pain may change over time. You may need to try different treatment options or a combination of treatments. Some options include:  Treating the underlying cause of the neuropathy, such as diabetes, kidney disease, or vitamin deficiencies.  Stopping medicines that can cause neuropathy, such as chemotherapy.  Medicine to relieve pain. Medicines may include: ? Prescription or over-the-counter pain medicine. ? Anti-seizure medicine. ? Antidepressant medicines. ? Pain-relieving patches that are applied to painful areas of skin. ? A medicine to numb the area (local anesthetic), which can be injected as a nerve block.  Transcutaneous nerve stimulation. This uses electrical currents to block painful nerve signals. The treatment is painless.  Alternative treatments, such as: ? Acupuncture. ? Meditation. ?  Massage. ? Physical therapy. ? Pain management programs. ? Counseling. Follow these instructions at home: Medicines   Take over-the-counter and prescription medicines only as told by your health care provider.  Do not drive or use heavy machinery while taking prescription pain medicine.  If you are taking prescription pain medicine, take actions to prevent or treat constipation. Your health care provider may recommend that you: ? Drink enough fluid to keep your urine pale yellow. ? Eat foods that are high in fiber, such as fresh fruits and vegetables, whole grains, and beans. ? Limit foods that are high in fat and processed sugars, such as fried or sweet foods. ? Take an over-the-counter or prescription medicine for constipation. Lifestyle   Have a good support system at home.  Consider joining a chronic pain  support group.  Do not use any products that contain nicotine or tobacco, such as cigarettes and e-cigarettes. If you need help quitting, ask your health care provider.  Do not drink alcohol. General instructions  Learn as much as you can about your condition.  Work closely with all your health care providers to find the treatment plan that works best for you.  Ask your health care provider what activities are safe for you.  Keep all follow-up visits as told by your health care provider. This is important. Contact a health care provider if:  Your pain treatments are not working.  You are having side effects from your medicines.  You are struggling with tiredness (fatigue), mood changes, depression, or anxiety. Summary  Neuropathic pain is pain caused by damage to the nerves that are responsible for certain sensations in your body (sensory nerves).  Neuropathic pain may come and go as damaged nerves heal, or it may stay at the same level for years.  Neuropathic pain is usually a long-term condition that can be difficult to treat. Consider joining a chronic pain support group. This information is not intended to replace advice given to you by your health care provider. Make sure you discuss any questions you have with your health care provider. Document Revised: 01/03/2019 Document Reviewed: 09/29/2017 Elsevier Patient Education  [place].

## 2020-01-01 NOTE — Progress Notes (Signed)
ANNUAL PREVENTATIVE CARE GYNECOLOGY  ENCOUNTER NOTE  Subjective:       Sandra Hendrix is a 74 y.o. G34P2002 female here with PMH of well-differentiated endometrioid adenocarcinoma the uterus, Stage IB s/p hysterectomy BSO with sentinel lymph node sampling in 02/2017, status post pelvic radiation therapy, presents for a routine annual gynecologic exam and routine surveillance.  Last seen by Oncologist 6 months ago. The patient is not sexually active. She is not taking hormone replacement therapy. Patient denies post-menopausal vaginal bleeding. The patient wears seatbelts: yes. The patient participates in regular exercise: yes (3 times per week). Has the patient ever been transfused or tattooed?: no.    Current complaints: 1.  Recently had bronchitis, is completing a course of antibiotics.  2. Urinary incontinence has improved with medications.  3. Notes worsening left leg pain since her surgery. Feels burning, aching.    Gynecologic History No LMP recorded. Patient has had a hysterectomy. Contraception: post menopausal status and patient has had a hysterectomy. Last Pap: 10/17/2016. Results were: normal Last mammogram: 03/08/2019 at [place]. Results were: normal Last Colonoscopy: 06/14/2016. Results were: diverticulosis and internal hemorrhoids, but otherwise normal.  Last Dexa Scan:  10/23/2018. Results were: normal.  T-score 0.3.  PCP: Dr. Deborra Medina.    Obstetric History OB History  Gravida Para Term Preterm AB Living  '2 2 2     2  ' SAB TAB Ectopic Multiple Live Births          2    # Outcome Date GA Lbr Len/2nd Weight Sex Delivery Anes PTL Lv  2 Term 1972   9 lb (4.082 kg) F Vag-Spont   LIV  1 Term 1970   9 lb (4.082 kg) F Vag-Spont   LIV    Past Medical History:  Diagnosis Date  . Allergy   . Anxiety   . Arthritis   . Barrett's esophagus determined by endoscopy   . Cancer [facility])    Uterine Cancer  . Chronic kidney disease   . Depression   . Diabetes mellitus  without complication ([place])   . Difficult intubation    "not sure what happened,asleep was told had problem with intubation"  . Diverticulosis   . Fatty liver   . GERD (gastroesophageal reflux disease)   . History of bronchitis   . History of hiatal hernia   . Hyperlipidemia   . Hypertension   . Sleep apnea    Use C- PAP  . Urinary incontinence   . Varicose veins     Family History  Problem Relation Age of Onset  . Stroke Mother   . Hypertension Mother   . Lupus Mother   . Alcohol abuse Father   . Diabetes Father   . Cancer Father        gallbladder ca into liver   . Arthritis Sister   . Diabetes Sister   . Lumbar disc disease Sister   . Hyperlipidemia Daughter   . Hypertension Daughter   . Cancer Maternal Aunt        ovarian ca  . Diabetes Paternal Aunt   . Drug abuse Paternal Aunt   . Cancer Maternal Grandfather        colon ca  . Hypertension Brother   . Alcohol abuse Brother   . Stroke Brother     Past Surgical History:  Procedure Laterality Date  . CARDIAC CATHETERIZATION    . CHOLECYSTECTOMY    . COLONOSCOPY WITH PROPOFOL N/A 06/14/2016   Procedure:  COLONOSCOPY WITH PROPOFOL;  Surgeon: Lollie Sails, MD;  Location[facility] END[facility]COPY;  Service: Endoscopy;  Laterality: N/A;  . ESOPHAGOGASTRODUODENOSCOPY (EGD) WITH PROPOFOL N/A 06/14/2016   Procedure: ESOPHAGOGASTRODUODENOSCOPY (EGD) WITH PROPOFOL;  Surgeon: Lollie Sails, MD;  Location: [facility] ENDOSCOPY;  Service: Endoscopy;  Laterality: N/A;  . ESOPHAGOGASTRODUODENOSCOPY (EGD) WITH PROPOFOL N/A 06/12/2017   Procedure: ESOPHAGOGASTRODUODENOSCOPY (EGD) WITH PROPOFOL;  Surgeon: Lollie Sails, MD;  Location: [facility] ENDOSCOPY;  Service: Endoscopy;  Laterality: N/A;  . ESOPHAGOGASTRODUODENOSCOPY (EGD) WITH PROPOFOL N/A 08/25/2017   Procedure: ESOPHAGOGASTRODUODENOSCOPY (EGD) WITH PROPOFOL;  Surgeon: Lollie Sails, MD;  Location: [facility] ENDOSCOPY;  Service: Endoscopy;  Laterality: N/A;  . HYSTEROSCOPY WITH D &  C N/A 01/02/2017   Procedure: DILATATION AND CURETTAGE /HYSTEROSCOPY;  Surgeon: Brayton Mars, MD;  Location: [facility] ORS;  Service: Gynecology;  Laterality: N/A;  . JOINT REPLACEMENT Right 2007   Total Knee Replacement  . JOINT REPLACEMENT Left 2004   Total Knee Replacement  . LAPAROSCOPIC HYSTERECTOMY Bilateral 03/01/2017   Procedure: HYSTERECTOMY TOTAL LAPAROSCOPIC BSO;  Surgeon: Mellody Drown, MD;  Location: [facility] ORS;  Service: Gynecology;  Laterality: Bilateral;  . ROTATOR CUFF REPAIR Right   . SENTINEL NODE BIOPSY N/A 03/01/2017   Procedure: SENTINEL NODE INJECTION AND BIOPSY;  Surgeon: Mellody Drown, MD;  Location: [facility] ORS;  Service: Gynecology;  Laterality: N/A;  . TONSILLECTOMY     as a child  . TUBAL LIGATION    . UPPER GI ENDOSCOPY     x2    Social History   Socioeconomic History  . Marital status: Married    Spouse name: Not on file  . Number of children: 2  . Years of education: 43  . Highest education level: Some college, no degree  Occupational History  . Not on file  Tobacco Use  . Smoking status: Never Smoker  . Smokeless tobacco: Never Used  Substance and Sexual Activity  . Alcohol use: Not Currently    Comment: rare  . Drug use: No  . Sexual activity: Yes    Comment: not often  Other Topics Concern  . Not on file  Social History Narrative  . Not on file   Social Determinants of Health   Financial Resource Strain:   . Difficulty of Paying Living Expenses:   Food Insecurity:   . Worried About Charity fundraiser in the Last Year:   . Arboriculturist in the Last Year:   Transportation Needs:   . Film/video editor (Medical):   Marland Kitchen Lack of Transportation (Non-Medical):   Physical Activity:   . Days of Exercise per Week:   . Minutes of Exercise per Session:   Stress:   . Feeling of Stress :   Social Connections:   . Frequency of Communication with Friends and Family:   . Frequency of Social Gatherings with Friends and Family:   .  Attends Religious Services:   . Active Member of Clubs or Organizations:   . Attends Archivist Meetings:   Marland Kitchen Marital Status:   Intimate Partner Violence:   . Fear of Current or Ex-Partner:   . Emotionally Abused:   Marland Kitchen Physically Abused:   . Sexually Abused:     Current Outpatient Medications on File Prior to Visit  Medication Sig Dispense Refill  . ACCU-CHEK AVIVA PLUS test strip TEST TWO TIMES DAILY 200 each 0  . acetaminophen (TYLENOL) 500 MG tablet Take 2 tablets (1,000 mg total) by mouth every 6 (six)  hours. (Patient taking differently: Take 1,000 mg every 6 (six) hours as needed by mouth. ) 65 tablet 1  . albuterol (PROVENTIL HFA;VENTOLIN HFA) 108 (90 BASE) MCG/ACT inhaler Inhale 2 puffs into the lungs every 6 (six) hours as needed for wheezing or shortness of breath.     . Ascorbic Acid 500 MG CHEW Chew by mouth.    Marland Kitchen aspirin EC 81 MG tablet Take 81 mg by mouth every evening.     Marland Kitchen atorvastatin (LIPITOR) 40 MG tablet Take 0.5 tablets (20 mg total) by mouth daily at 6 PM. 90 tablet 3  . azelastine (ASTELIN) 0.1 % nasal spray Place 1 spray into both nostrils daily.    . Biotin 5000 MCG TABS Take by mouth 2 (two) times daily.     . blood glucose meter kit and supplies Use to check blood sugar twice daily. ICD 10: E11.29 1 each 0  . Cinnamon Bark POWD Take by mouth.    . COLLAGEN PO Take by mouth 2 (two) times daily. For improved fingernail strength    . cyclobenzaprine (FLEXERIL) 10 MG tablet Take 10 mg by mouth daily as needed for muscle spasms.    Marland Kitchen docusate sodium (COLACE) 100 MG capsule Take 1 capsule (100 mg total) by mouth 2 (two) times daily. 20 capsule 0  . DUREZOL 0.05 % EMUL INSTILL 1 DROP INTO LEFT EYE THREE TIMES DAILY AS DIRECTED    . fish oil-omega-3 fatty acids 1000 MG capsule Take 1 g by mouth every evening.     . fluticasone (FLONASE) 50 MCG/ACT nasal spray Place 1 spray into both nostrils daily. 48 g 1  . furosemide (LASIX) 20 MG tablet Take 1 tablet (20 mg  total) by mouth every other day. 45 tablet 0  . glipiZIDE (GLUCOTROL XL) 5 MG 24 hr tablet Take 1 tablet (5 mg total) by mouth daily. 90 tablet 0  . insulin detemir (LEVEMIR) 100 UNIT/ML injection Levemir U-100 Insulin 100 unit/mL subcutaneous solution    . JANUVIA 100 MG tablet TAKE 1 TABLET BY MOUTH  DAILY 90 tablet 3  . Lancet Devices (ACCU-CHEK SOFTCLIX) lancets Use to test blood sugars 1 -2 times daily 1 each 3  . LEVEMIR 100 UNIT/ML injection INJECT 20 UNITS SUBCUTANEOUSLY AT BEDTIME 10 mL 0  . loratadine (CLARITIN) 10 MG tablet Take 10 mg by mouth daily.    Marland Kitchen losartan-hydrochlorothiazide (HYZAAR) 50-12.5 MG tablet TAKE 1 TABLET BY MOUTH  DAILY 90 tablet 3  . Magnesium 250 MG TABS Take 1 tablet by mouth daily.    . meclizine (ANTIVERT) 25 MG tablet Take 1 tablet (25 mg total) by mouth 3 (three) times daily as needed for dizziness. 30 tablet 3  . metFORMIN (GLUCOPHAGE) 500 MG tablet Take 1 tablet (500 mg total) by mouth 2 (two) times daily with a meal. 180 tablet 2  . mirabegron ER (MYRBETRIQ) 50 MG TB24 tablet Take 1 tablet (50 mg total) by mouth daily. 30 tablet 11  . Multiple Vitamin (MULTIVITAMIN) tablet Take 1 tablet by mouth daily.    . nitroGLYCERIN (NITROSTAT) 0.4 MG SL tablet Place 1 tablet (0.4 mg total) under the tongue every 5 (five) minutes as needed for chest pain. 50 tablet 3  . NON FORMULARY medroxy progesterone one 74m pill once daily    . oxybutynin (DITROPAN-XL) 10 MG 24 hr tablet Take 1 tablet (10 mg total) by mouth daily. 30 tablet 11  . pantoprazole (PROTONIX) 40 MG tablet Take 1 tablet by mouth  twice daily 180 tablet 3  . Potassium 99 MG TABS Take 1 tablet by mouth daily.    . potassium chloride SA (KLOR-CON) 20 MEQ tablet Take 1 tablet (20 mEq total) by mouth every other day. 45 tablet 1  . Probiotic Product (PROBIOTIC-10) CAPS Take 1 capsule by mouth daily.     . sertraline (ZOLOFT) 50 MG tablet Take 1 tablet (50 mg total) by mouth daily. 90 tablet 0  . solifenacin  (VESICARE) 5 MG tablet Take 1 tablet (5 mg total) by mouth daily. 30 tablet 11  . tiZANidine (ZANAFLEX) 4 MG tablet tizanidine 4 mg tablet    . verapamil (VERELAN PM) 180 MG 24 hr capsule Take 1 capsule (180 mg total) by mouth daily. 90 capsule 0  . vitamin B-12 (CYANOCOBALAMIN) 1000 MCG tablet Take 1,000 mcg by mouth daily.    . vitamin C (ASCORBIC ACID) 500 MG tablet Take 500 mg by mouth daily.    . Zinc 50 MG TABS Take by mouth daily.    Marland Kitchen amLODipine (NORVASC) 5 MG tablet Take by mouth.    . BESIVANCE 0.6 % SUSP INSTILL 1 DROP INTO LEFT EYE THREE TIMES DAILY AS DIRECTED    . Cetirizine HCl 10 MG CAPS Take by mouth.     No current facility-administered medications on file prior to visit.    Allergies  Allergen Reactions  . Clindamycin/Lincomycin Rash  . Codeine Rash  . Lincomycin Rash  . Morphine And Related Nausea Only  . Talwin [Pentazocine] Nausea And Vomiting  . Lisinopril Cough    cough  . Naloxone Other (See Comments)    Had issues with sedation and vomiting with EGD  . Oxycodone-Acetaminophen Other (See Comments)    Pt states this medication makes her bowels stop moving      Review of Systems ROS Review of Systems - General ROS: negative for - chills, fatigue, fever, hot flashes, night sweats, weight gain or weight loss Psychological ROS: negative for - anxiety, decreased libido, depression, mood swings, physical abuse or sexual abuse Ophthalmic ROS: negative for - blurry vision, eye pain or loss of vision ENT ROS: negative for - headaches, hearing change, visual changes or vocal changes Allergy and Immunology ROS: negative for - hives, itchy/watery eyes or seasonal allergies Hematological and Lymphatic ROS: negative for - bleeding problems, bruising, swollen lymph nodes or weight loss Endocrine ROS: negative for - galactorrhea, hair pattern changes, hot flashes, malaise/lethargy, mood swings, palpitations, polydipsia/polyuria, skin changes, temperature intolerance or  unexpected weight changes Breast ROS: negative for - new or changing breast lumps or nipple discharge Respiratory ROS: negative for - cough or shortness of breath Cardiovascular ROS: negative for - chest pain, irregular heartbeat, palpitations or shortness of breath Gastrointestinal ROS: no abdominal pain, change in bowel habits, or black or bloody stools Genito-Urinary ROS: no dysuria, trouble voiding, or hematuria Musculoskeletal ROS: negative for - joint pain or joint stiffness Neurological ROS: negative for - bowel and bladder control changes Dermatological ROS: negative for rash and skin lesion changes   Objective:   BP 119/70 (BP Location: Left Arm)   Pulse 75   Ht '5\' 1"'  (1.549 m)   Wt 180 lb (81.6 kg)   BMI 34.01 kg/m  CONSTITUTIONAL: Well-developed, well-nourished female in no acute distress.  PSYCHIATRIC: Normal mood and affect. Normal behavior. Normal judgment and thought content. Fidelis: Alert and oriented to person, place, and time. Normal muscle tone coordination. No cranial nerve deficit noted. HENT:  Normocephalic, atraumatic, External right  and left ear normal. Oropharynx is clear and moist EYES: Conjunctivae and EOM are normal. Pupils are equal, round, and reactive to light. No scleral icterus.  NECK: Normal range of motion, supple, no masses.  Normal thyroid.  SKIN: Skin is warm and dry. No rash noted. Not diaphoretic. No erythema. No pallor. CARDIOVASCULAR: Normal heart rate noted, regular rhythm, no murmur. RESPIRATORY: Clear to auscultation bilaterally. Effort and breath sounds normal, no problems with respiration noted. BREASTS: Symmetric in size. No masses, skin changes, nipple drainage, or lymphadenopathy. ABDOMEN: Soft, normal bowel sounds, no distention noted.  No tenderness, rebound or guarding.  BLADDER: Normal PELVIC:  Bladder no bladder distension noted  Urethra: normal appearing urethra with no masses, tenderness or lesions  Vulva: normal appearing  vulva with no masses, tenderness or lesions  Vagina: atrophic, moderate.  No lesions or masses.  First degree cystocele.   Cervix: surgically absent  Uterus: surgically absent, vaginal cuff well healed  Adnexa: surgically absent bilateral  RV: External Exam NormaI, No Rectal Masses and Normal Sphincter tone  MUSCULOSKELETAL: Normal range of motion. No tenderness.  No cyanosis, clubbing, or edema.  2+ distal pulses. LYMPHATIC: No Axillary, Supraclavicular, or Inguinal Adenopathy.   Labs: Lab Results  Component Value Date   WBC 4.2 05/15/2017   HGB 12.0 05/15/2017   HCT 34.5 (L) 05/15/2017   MCV 88.0 05/15/2017   PLT 316 05/15/2017    Lab Results  Component Value Date   CREATININE 0.98 10/21/2019   BUN 22 10/21/2019   NA 138 10/21/2019   K 4.2 10/21/2019   CL 102 10/21/2019   CO2 29 10/21/2019    Lab Results  Component Value Date   ALT 16 10/11/2019   AST 20 10/11/2019   ALKPHOS 99 10/11/2019   BILITOT 0.5 10/11/2019    Lab Results  Component Value Date   CHOL 208 (H) 04/05/2019   HDL 108.20 04/05/2019   LDLCALC 80 04/05/2019   LDLDIRECT 80.0 02/22/2017   TRIG 97.0 04/05/2019   CHOLHDL 2 04/05/2019    Lab Results  Component Value Date   TSH 2.78 09/27/2018    Lab Results  Component Value Date   HGBA1C 7.3 (H) 10/11/2019     Assessment:    1. Endometrioid adenocarcinoma of uterus ([place])   2. Encounter for gynecological examination without abnormal finding   3. 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 Menopause   5. Obesity (BMI 30.0-34.9)   6. Urge incontinence of urine     Plan:  Continue q 6 month screening rotating between Oncology and GYN.  Incontinence managed with medications. Patient remains NED. Pap: Not needed. Patient is s/p hysterectomy and beyond age-based screening. Mammogram: Ordered Stool Guaiac Testing:  Ordered. Patient Is also up to date on colonoscopy.  Labs: No labs ordered. Plans to have labs done  with [facility] Routine preventative health maintenance measures emphasized: Exercise/Diet/Weight control, Tobacco Warnings, Alcohol/Substance use risks and Stress Management  Vitamin D and calcium supplementation or prevention of osteoporosis.  Discussed neuropathy symptoms with patient. Has a h/o of neuropathy in her feet, however nueropathy in leg developed after surgery and has been slowly worsening. Discussed options of medications vs referral to physical therapy. Patient desires referral. Will order.  Return to [place], MD  [facility]

## 2020-01-01 NOTE — Progress Notes (Signed)
Pt present for annual exam. Pt stated that she was doing well no problems.  

## 2020-01-02 ENCOUNTER — Other Ambulatory Visit: Payer: Self-pay

## 2020-01-02 MED ORDER — GLIPIZIDE ER 5 MG PO TB24: 5.0000 mg | ORAL_TABLET | Freq: Every day | ORAL | 3 refills | Status: DC

## 2020-01-02 MED ORDER — FUROSEMIDE 20 MG PO TABS: 20.0000 mg | ORAL_TABLET | ORAL | 3 refills | Status: DC

## 2020-01-02 MED ORDER — VERAPAMIL HCL ER 180 MG PO CP24: 180.0000 mg | ORAL_CAPSULE | Freq: Every day | ORAL | 3 refills | Status: DC

## 2020-01-02 MED ORDER — SERTRALINE HCL 50 MG PO TABS: 50.0000 mg | ORAL_TABLET | Freq: Every day | ORAL | 3 refills | Status: DC

## 2020-01-03 DIAGNOSIS — G4733 Obstructive sleep apnea (adult) (pediatric): Secondary | ICD-10-CM | POA: Diagnosis not present

## 2020-01-13 ENCOUNTER — Telehealth: Payer: Self-pay | Admitting: *Deleted

## 2020-01-13 ENCOUNTER — Other Ambulatory Visit: Payer: Self-pay | Admitting: Internal Medicine

## 2020-01-13 DIAGNOSIS — Z794 Long term (current) use of insulin: Secondary | ICD-10-CM

## 2020-01-13 DIAGNOSIS — N1831 Chronic kidney disease, stage 3a: Secondary | ICD-10-CM

## 2020-01-13 DIAGNOSIS — E785 Hyperlipidemia, unspecified: Secondary | ICD-10-CM

## 2020-01-13 DIAGNOSIS — E1121 Type 2 diabetes mellitus with diabetic nephropathy: Secondary | ICD-10-CM

## 2020-01-13 NOTE — Telephone Encounter (Signed)
Please place future orders for lab appt.  

## 2020-01-14 ENCOUNTER — Other Ambulatory Visit: Payer: Self-pay

## 2020-01-14 ENCOUNTER — Other Ambulatory Visit (INDEPENDENT_AMBULATORY_CARE_PROVIDER_SITE_OTHER): Payer: Medicare PPO

## 2020-01-14 DIAGNOSIS — E785 Hyperlipidemia, unspecified: Secondary | ICD-10-CM | POA: Diagnosis not present

## 2020-01-14 DIAGNOSIS — Z794 Long term (current) use of insulin: Secondary | ICD-10-CM

## 2020-01-14 DIAGNOSIS — E1121 Type 2 diabetes mellitus with diabetic nephropathy: Secondary | ICD-10-CM | POA: Diagnosis not present

## 2020-01-14 LAB — LIPID PANEL
Cholesterol: 171 mg/dL (ref 0–200)
HDL: 90.5 mg/dL (ref >39.00)
LDL Cholesterol: 63 mg/dL (ref 0–99)
NonHDL: 80.25
Total CHOL/HDL Ratio: 2
Triglycerides: 88 mg/dL (ref 0.0–149.0)
VLDL: 17.6 mg/dL (ref 0.0–40.0)

## 2020-01-14 LAB — COMPREHENSIVE METABOLIC PANEL
ALT: 18 U/L (ref 0–35)
AST: 22 U/L (ref 0–37)
Albumin: 4.3 g/dL (ref 3.5–5.2)
Alkaline Phosphatase: 93 U/L (ref 39–117)
BUN: 22 mg/dL (ref 6–23)
CO2: 27 mEq/L (ref 19–32)
Calcium: 9.5 mg/dL (ref 8.4–10.5)
Chloride: 100 mEq/L (ref 96–112)
Creatinine, Ser: 1.1 mg/dL (ref 0.40–1.20)
GFR: 48.32 mL/min — ABNORMAL LOW (ref >60.00)
Glucose, Bld: 112 mg/dL — ABNORMAL HIGH (ref 70–99)
Potassium: 3.6 mEq/L (ref 3.5–5.1)
Sodium: 136 mEq/L (ref 135–145)
Total Bilirubin: 0.4 mg/dL (ref 0.2–1.2)
Total Protein: 7 g/dL (ref 6.0–8.3)

## 2020-01-14 LAB — HEMOGLOBIN A1C: Hgb A1c MFr Bld: 7.1 % — ABNORMAL HIGH (ref 4.6–6.5)

## 2020-01-14 LAB — MICROALBUMIN / CREATININE URINE RATIO
Creatinine,U: 88.3 mg/dL
Microalb Creat Ratio: 0.8 mg/g (ref 0.0–30.0)
Microalb, Ur: <0.7 mg/dL (ref 0.0–1.9)

## 2020-01-16 ENCOUNTER — Other Ambulatory Visit: Payer: Self-pay | Admitting: Internal Medicine

## 2020-01-16 ENCOUNTER — Telehealth: Payer: Self-pay

## 2020-01-16 DIAGNOSIS — J40 Bronchitis, not specified as acute or chronic: Secondary | ICD-10-CM | POA: Diagnosis not present

## 2020-01-16 DIAGNOSIS — Z03818 Encounter for observation for suspected exposure to other biological agents ruled out: Secondary | ICD-10-CM | POA: Diagnosis not present

## 2020-01-16 DIAGNOSIS — J9801 Acute bronchospasm: Secondary | ICD-10-CM | POA: Diagnosis not present

## 2020-01-16 DIAGNOSIS — R05 Cough: Secondary | ICD-10-CM | POA: Diagnosis not present

## 2020-01-16 DIAGNOSIS — Z20822 Contact with and (suspected) exposure to covid-19: Secondary | ICD-10-CM | POA: Diagnosis not present

## 2020-01-16 NOTE — Telephone Encounter (Signed)
error 

## 2020-01-17 ENCOUNTER — Ambulatory Visit: Payer: Medicare PPO | Admitting: Internal Medicine

## 2020-01-22 ENCOUNTER — Encounter: Payer: Self-pay | Admitting: Internal Medicine

## 2020-01-22 ENCOUNTER — Telehealth (INDEPENDENT_AMBULATORY_CARE_PROVIDER_SITE_OTHER): Payer: Medicare PPO | Admitting: Internal Medicine

## 2020-01-22 VITALS — BP 135/80 | HR 68 | Ht 61.0 in | Wt 174.0 lb

## 2020-01-22 DIAGNOSIS — E1121 Type 2 diabetes mellitus with diabetic nephropathy: Secondary | ICD-10-CM | POA: Diagnosis not present

## 2020-01-22 DIAGNOSIS — E785 Hyperlipidemia, unspecified: Secondary | ICD-10-CM | POA: Diagnosis not present

## 2020-01-22 DIAGNOSIS — Z794 Long term (current) use of insulin: Secondary | ICD-10-CM | POA: Diagnosis not present

## 2020-01-22 DIAGNOSIS — N1831 Chronic kidney disease, stage 3a: Secondary | ICD-10-CM

## 2020-01-22 DIAGNOSIS — K760 Fatty (change of) liver, not elsewhere classified: Secondary | ICD-10-CM | POA: Diagnosis not present

## 2020-01-22 DIAGNOSIS — J40 Bronchitis, not specified as acute or chronic: Secondary | ICD-10-CM | POA: Diagnosis not present

## 2020-01-22 NOTE — Assessment & Plan Note (Signed)
Presumed by ultrasound changes and serologies negative for autoimmune causes of hepatitis.  Current liver enzymes are normal and all modifiable risk factors including obesity, diabetes and hyperlipidemia have been addressed .  Lab Results  Component Value Date   ALT 18 01/14/2020   AST 22 01/14/2020   ALKPHOS 93 01/14/2020   BILITOT 0.4 01/14/2020

## 2020-01-22 NOTE — Assessment & Plan Note (Signed)
LDL and triglycerides are at goal on current  Dose of atorvastatin,  20 mg daily she has no side effects and liver enzymes are normal. No changes today.  Lab Results  Component Value Date   CHOL 171 01/14/2020   HDL 90.50 01/14/2020   LDLCALC 63 01/14/2020   LDLDIRECT 80.0 02/22/2017   TRIG 88.0 01/14/2020   CHOLHDL 2 01/14/2020   Lab Results  Component Value Date   ALT 18 01/14/2020   AST 22 01/14/2020   ALKPHOS 93 01/14/2020   BILITOT 0.4 01/14/2020

## 2020-01-22 NOTE — Patient Instructions (Signed)
  The new goals for optimal blood pressure management are 120/70 to 130/80    Please check your blood pressure a few times at home and send me the readings so I can determine if you need a change in medication   Follow up 3 months

## 2020-01-22 NOTE — Assessment & Plan Note (Addendum)
Improved control.  No changes to regimen . cotntinue 2.5 mg glipizide XR,  Januvia 100 mg daily , Levemir 20 units daily and metformin 500 mg bid.   Lab Results  Component Value Date   HGBA1C 7.1 (H) 01/14/2020   Lab Results  Component Value Date   LABMICR See below: 09/30/2019   MICROALBUR <0.7 01/14/2020   MICROALBUR 1.3 10/11/2019

## 2020-01-22 NOTE — Progress Notes (Signed)
Virtual Visit via [place]  This visit type was conducted due to national recommendations for restrictions regarding the COVID-19 pandemic (e.g. social distancing).  This format is felt to be most appropriate for this patient at this time.  All issues noted in this document were discussed and addressed.  No physical exam was performed (except for noted visual exam findings with Video Visits).   I connected with@ on 01/22/20 at  9:00 AM EDT by a video enabled telemedicine application and verified that I am speaking with the correct person using two identifiers. Location patient: home Location provider: work or home office Persons participating in the virtual visit: patient, provider  I discussed the limitations, risks, security and privacy concerns of performing an evaluation and management service by telephone and the availability of in person appointments. I also discussed with the patient that there may be a patient responsible charge related to this service. The patient expressed understanding and agreed to proceed.  Reason for visit: follow up  HPI:  74 yr old female with  controlled type 2 DM with CKD and  hypertension , hyperlipidemia here for 3 month follow up as well as follow up on persistent cough attributed to sinusitis/bronchitis  Hx:  Treated 3/29 by walk in clinic with doxy, prednisone, albuterol for bronchitis.  Continued to have purulent sputum per patient.   Seen again on April 22 and given azithromycin.  Chest x ray done was negative for infiltrates.  Had a r hiatal hernia.  taking a probiotic .  Symptoms improving.    Had an episode of chest pain yesterday that lasted several seconds and occurred at rest while watching TV.  Had not been coughing .  [facility]  No chest pain with exercise.  Has resumed exercise with good tolerance.     T2DM:  She  feels generally well,  But is not  exercising regularly or trying to lose weight. Checking blood  sugars less than once daily at variable times,  usually only if she feels she may be having a hypoglycemic event. .  BS have been under 130 fasting and < 150 post prandially.  Denies any recent hypoglyemic events.  Taking   medications as directed. Following a carbohydrate modified diet 6 days per week. Denies numbness, burning and tingling of extremities. Appetite is good.   HTN:   Home BP 135/80,  142/74, usually 108/69   ROS: See pertinent positives and negatives per HPI.  Past Medical History:  Diagnosis Date  . Allergy   . Anxiety   . Arthritis   . Barrett's esophagus determined by endoscopy   . Cancer [facility]    Uterine Cancer  . Chronic kidney disease   . Depression   . Diabetes mellitus without complication ([place])   . Difficult intubation    "not sure what happened,asleep was told had problem with intubation"  . Diverticulosis   . Fatty liver   . GERD (gastroesophageal reflux disease)   . History of bronchitis   . History of hiatal hernia   . Hyperlipidemia   . Hypertension   . Sleep apnea    Use C- PAP  . Urinary incontinence   . Varicose veins     Past Surgical History:  Procedure Laterality Date  . CARDIAC CATHETERIZATION    . CHOLECYSTECTOMY    . COLONOSCOPY WITH PROPOFOL N/A 06/14/2016   Procedure: COLONOSCOPY WITH PROPOFOL;  Surgeon: Lollie Sails, MD;  Location: [facility] ENDOSCOPY;  Service:  Endoscopy;  Laterality: N/A;  . ESOPHAGOGASTRODUODENOSCOPY (EGD) WITH PROPOFOL N/A 06/14/2016   Procedure: ESOPHAGOGASTRODUODENOSCOPY (EGD) WITH PROPOFOL;  Surgeon: Lollie Sails, MD;  Location: [facility] ENDOSCOPY;  Service: Endoscopy;  Laterality: N/A;  . ESOPHAGOGASTRODUODENOSCOPY (EGD) WITH PROPOFOL N/A 06/12/2017   Procedure: ESOPHAGOGASTRODUODENOSCOPY (EGD) WITH PROPOFOL;  Surgeon: Lollie Sails, MD;  Location: [facility] ENDOSCOPY;  Service: Endoscopy;  Laterality: N/A;  . ESOPHAGOGASTRODUODENOSCOPY (EGD) WITH PROPOFOL N/A 08/25/2017   Procedure:  ESOPHAGOGASTRODUODENOSCOPY (EGD) WITH PROPOFOL;  Surgeon: Lollie Sails, MD;  Location: [facility] ENDOSCOPY;  Service: Endoscopy;  Laterality: N/A;  . HYSTEROSCOPY WITH D & C N/A 01/02/2017   Procedure: DILATATION AND CURETTAGE /HYSTEROSCOPY;  Surgeon: Brayton Mars, MD;  Location: [facility] ORS;  Service: Gynecology;  Laterality: N/A;  . JOINT REPLACEMENT Right 2007   Total Knee Replacement  . JOINT REPLACEMENT Left 2004   Total Knee Replacement  . LAPAROSCOPIC HYSTERECTOMY Bilateral 03/01/2017   Procedure: HYSTERECTOMY TOTAL LAPAROSCOPIC BSO;  Surgeon: Mellody Drown, MD;  Location: [facility] ORS;  Service: Gynecology;  Laterality: Bilateral;  . ROTATOR CUFF REPAIR Right   . SENTINEL NODE BIOPSY N/A 03/01/2017   Procedure: SENTINEL NODE INJECTION AND BIOPSY;  Surgeon: Mellody Drown, MD;  Location: [facility] ORS;  Service: Gynecology;  Laterality: N/A;  . TONSILLECTOMY     as a child  . TUBAL LIGATION    . UPPER GI ENDOSCOPY     x2    Family History  Problem Relation Age of Onset  . Stroke Mother   . Hypertension Mother   . Lupus Mother   . Alcohol abuse Father   . Diabetes Father   . Cancer Father        gallbladder ca into liver   . Arthritis Sister   . Diabetes Sister   . Lumbar disc disease Sister   . Hyperlipidemia Daughter   . Hypertension Daughter   . Cancer Maternal Aunt        ovarian ca  . Diabetes Paternal Aunt   . Drug abuse Paternal Aunt   . Cancer Maternal Grandfather        colon ca  . Hypertension Brother   . Alcohol abuse Brother   . Stroke Brother     SOCIAL HX:  reports that she has never smoked. She has never used smokeless tobacco. She reports previous alcohol use. She reports that she does not use drugs.   Current Outpatient Medications:  .  ACCU-CHEK AVIVA PLUS test strip, TEST TWO TIMES DAILY, Disp: 200 each, Rfl: 0 .  acetaminophen (TYLENOL) 500 MG tablet, Take 2 tablets (1,000 mg total) by mouth every 6 (six) hours. (Patient taking differently: Take  1,000 mg every 6 (six) hours as needed by mouth. ), Disp: 65 tablet, Rfl: 1 .  albuterol (PROVENTIL HFA;VENTOLIN HFA) 108 (90 BASE) MCG/ACT inhaler, Inhale 2 puffs into the lungs every 6 (six) hours as needed for wheezing or shortness of breath. , Disp: , Rfl:  .  amLODipine (NORVASC) 5 MG tablet, Take by mouth., Disp: , Rfl:  .  Ascorbic Acid 500 MG CHEW, Chew by mouth., Disp: , Rfl:  .  aspirin EC 81 MG tablet, Take 81 mg by mouth every evening. , Disp: , Rfl:  .  atorvastatin (LIPITOR) 40 MG tablet, Take 0.5 tablets (20 mg total) by mouth daily at 6 PM., Disp: 90 tablet, Rfl: 3 .  azelastine (ASTELIN) 0.1 % nasal spray, Place 1 spray into both nostrils daily., Disp: , Rfl:  .  benzonatate (  TESSALON) 200 MG capsule, , Disp: , Rfl:  .  Biotin 5000 MCG TABS, Take by mouth 2 (two) times daily. , Disp: , Rfl:  .  blood glucose meter kit and supplies, Use to check blood sugar twice daily. ICD 10: E11.29, Disp: 1 each, Rfl: 0 .  Cetirizine HCl 10 MG CAPS, Take by mouth., Disp: , Rfl:  .  Cinnamon Bark POWD, Take by mouth., Disp: , Rfl:  .  COLLAGEN PO, Take by mouth 2 (two) times daily. For improved fingernail strength, Disp: , Rfl:  .  docusate sodium (COLACE) 100 MG capsule, Take 1 capsule (100 mg total) by mouth 2 (two) times daily., Disp: 20 capsule, Rfl: 0 .  fish oil-omega-3 fatty acids 1000 MG capsule, Take 1 g by mouth every evening. , Disp: , Rfl:  .  fluticasone (FLONASE) 50 MCG/ACT nasal spray, Place 1 spray into both nostrils daily., Disp: 48 g, Rfl: 1 .  furosemide (LASIX) 20 MG tablet, Take 1 tablet (20 mg total) by mouth every other day., Disp: 45 tablet, Rfl: 3 .  glipiZIDE (GLUCOTROL XL) 5 MG 24 hr tablet, Take 1 tablet (5 mg total) by mouth daily., Disp: 90 tablet, Rfl: 3 .  JANUVIA 100 MG tablet, TAKE 1 TABLET BY MOUTH  DAILY, Disp: 90 tablet, Rfl: 3 .  Lancet Devices (ACCU-CHEK SOFTCLIX) lancets, Use to test blood sugars 1 -2 times daily, Disp: 1 each, Rfl: 3 .  LEVEMIR 100  UNIT/ML injection, INJECT 20 UNITS SUBCUTANEOUSLY AT BEDTIME, Disp: 10 mL, Rfl: 0 .  loratadine (CLARITIN) 10 MG tablet, Take 10 mg by mouth daily., Disp: , Rfl:  .  losartan-hydrochlorothiazide (HYZAAR) 50-12.5 MG tablet, TAKE 1 TABLET BY MOUTH  DAILY, Disp: 90 tablet, Rfl: 3 .  Magnesium 250 MG TABS, Take 1 tablet by mouth daily., Disp: , Rfl:  .  meclizine (ANTIVERT) 25 MG tablet, Take 1 tablet (25 mg total) by mouth 3 (three) times daily as needed for dizziness., Disp: 30 tablet, Rfl: 3 .  metFORMIN (GLUCOPHAGE) 500 MG tablet, Take 1 tablet (500 mg total) by mouth 2 (two) times daily with a meal., Disp: 180 tablet, Rfl: 2 .  Multiple Vitamin (MULTIVITAMIN) tablet, Take 1 tablet by mouth daily., Disp: , Rfl:  .  nitroGLYCERIN (NITROSTAT) 0.4 MG SL tablet, Place 1 tablet (0.4 mg total) under the tongue every 5 (five) minutes as needed for chest pain., Disp: 50 tablet, Rfl: 3 .  oxybutynin (DITROPAN-XL) 10 MG 24 hr tablet, Take 1 tablet (10 mg total) by mouth daily., Disp: 30 tablet, Rfl: 11 .  pantoprazole (PROTONIX) 40 MG tablet, Take 1 tablet by mouth twice daily, Disp: 180 tablet, Rfl: 3 .  potassium chloride SA (KLOR-CON) 20 MEQ tablet, Take 1 tablet (20 mEq total) by mouth every other day., Disp: 45 tablet, Rfl: 1 .  Probiotic Product (PROBIOTIC-10) CAPS, Take 1 capsule by mouth daily. , Disp: , Rfl:  .  sertraline (ZOLOFT) 50 MG tablet, Take 1 tablet (50 mg total) by mouth daily., Disp: 90 tablet, Rfl: 3 .  tiZANidine (ZANAFLEX) 4 MG tablet, tizanidine 4 mg tablet, Disp: , Rfl:  .  verapamil (VERELAN PM) 180 MG 24 hr capsule, Take 1 capsule (180 mg total) by mouth daily., Disp: 90 capsule, Rfl: 3 .  vitamin B-12 (CYANOCOBALAMIN) 1000 MCG tablet, Take 1,000 mcg by mouth daily., Disp: , Rfl:  .  Zinc 50 MG TABS, Take by mouth daily., Disp: , Rfl:  .  NON FORMULARY, medroxy  progesterone one 19m pill once daily, Disp: , Rfl:   EXAM:  VITALS per patient if applicable:  GENERAL: alert,  oriented, appears well and in no acute distress  HEENT: atraumatic, conjunttiva clear, no obvious abnormalities on inspection of external nose and ears  NECK: normal movements of the head and neck  LUNGS: on inspection no signs of respiratory distress, breathing rate appears normal, no obvious gross SOB, gasping or wheezing  CV: no obvious cyanosis  MS: moves all visible extremities without noticeable abnormality  PSYCH/NEURO: pleasant and cooperative, no obvious depression or anxiety, speech and thought processing grossly intact  ASSESSMENT AND PLAN:  Discussed the following assessment and plan:  Stage 3a chronic kidney disease - Plan: Comprehensive metabolic panel  Type 2 diabetes mellitus with diabetic nephropathy, with long-term current use of insulin (HCC) - Plan: Hemoglobin A1c  Hepatic steatosis  Hyperlipidemia LDL goal <100  Bronchitis  CKD (chronic kidney disease) stage 3, GFR 30-59 ml/min Stable GFR.  No changes   DM (diabetes mellitus), type 2 with renal complications (HCC) Improved control.  No changes to regimen . cotntinue 2.5 mg glipizide XR,  Januvia 100 mg daily , Levemir 20 units daily and metformin 500 mg bid.   Lab Results  Component Value Date   HGBA1C 7.1 (H) 01/14/2020   Lab Results  Component Value Date   LABMICR See below: 09/30/2019   MICROALBUR <0.7 01/14/2020   MICROALBUR 1.3 10/11/2019       Hepatic steatosis Presumed by ultrasound changes and serologies negative for autoimmune causes of hepatitis.  Current liver enzymes are normal and all modifiable risk factors including obesity, diabetes and hyperlipidemia have been addressed .  Lab Results  Component Value Date   ALT 18 01/14/2020   AST 22 01/14/2020   ALKPHOS 93 01/14/2020   BILITOT 0.4 01/14/2020      Hyperlipidemia LDL goal <100 LDL and triglycerides are at goal on current  Dose of atorvastatin,  20 mg daily she has no side effects and liver enzymes are normal. No  changes today.  Lab Results  Component Value Date   CHOL 171 01/14/2020   HDL 90.50 01/14/2020   LDLCALC 63 01/14/2020   LDLDIRECT 80.0 02/22/2017   TRIG 88.0 01/14/2020   CHOLHDL 2 01/14/2020   Lab Results  Component Value Date   ALT 18 01/14/2020   AST 22 01/14/2020   ALKPHOS 93 01/14/2020   BILITOT 0.4 01/14/2020      Bronchitis Resolved after 2 rounds of antibiotics and prednisone.  Chest x ray normal.   covid  test was negative x 2    I discussed the assessment and treatment plan with the patient. The patient was provided an opportunity to ask questions and all were answered. The patient agreed with the plan and demonstrated an understanding of the instructions.   The patient was advised to call back or seek an in-person evaluation if the symptoms worsen or if the condition fails to improve as anticipated.  I provided  30 minutes of non-face-to-face time during this encounter reviewing patient's current problems and past surgeries, labs and imaging studies, providing counseling on the above mentioned problems , and coordination  of care . TCrecencio Mc MD

## 2020-01-22 NOTE — Assessment & Plan Note (Signed)
Stable GFR.  No changes

## 2020-01-22 NOTE — Assessment & Plan Note (Signed)
Resolved after 2 rounds of antibiotics and prednisone.  Chest x ray normal.   covid  test was negative x 2

## 2020-01-28 ENCOUNTER — Ambulatory Visit: Payer: Medicare PPO | Attending: Obstetrics and Gynecology | Admitting: Physical Therapy

## 2020-01-28 ENCOUNTER — Other Ambulatory Visit: Payer: Self-pay

## 2020-01-28 ENCOUNTER — Encounter: Payer: Self-pay | Admitting: Physical Therapy

## 2020-01-28 DIAGNOSIS — M79652 Pain in left thigh: Secondary | ICD-10-CM | POA: Diagnosis not present

## 2020-01-28 DIAGNOSIS — M6281 Muscle weakness (generalized): Secondary | ICD-10-CM | POA: Diagnosis not present

## 2020-01-28 DIAGNOSIS — R209 Unspecified disturbances of skin sensation: Secondary | ICD-10-CM | POA: Diagnosis not present

## 2020-01-28 NOTE — Therapy (Signed)
[facility] [address] [place], [address] Phone: [phone]   Fax:  [phone]  Physical Therapy Evaluation  Patient Details  Name: Sandra Hendrix MRN: [id number] Date of Birth: Mar 19, 1946 Referring Provider (PT): Rubie Maid, MD   Encounter Date: 01/28/2020  PT End of Session - 01/28/20 1038    Visit Number  1    Number of Visits  24   as needed   Date for PT Re-Evaluation  04/21/20    Authorization Type  [employer] reporting period from 01/28/2020    Authorization Time Period  precert required    Authorization - Visit Number  1    Authorization - Number of Visits  10    Progress Note Due on Visit  10    PT Start Time  0915   pt late/went to wrong clinic first   PT Stop Time  1015    PT Time Calculation (min)  60 min    Activity Tolerance  Patient tolerated treatment well    Behavior During Therapy  [facility] for tasks assessed/performed       Past Medical History:  Diagnosis Date  . Allergy   . Anxiety   . Arthritis   . Barrett's esophagus determined by endoscopy   . Cancer [facility])    Uterine Cancer  . Chronic kidney disease   . Depression   . Diabetes mellitus without complication ([place])   . Difficult intubation    "not sure what happened,asleep was told had problem with intubation"  . Diverticulosis   . Fatty liver   . GERD (gastroesophageal reflux disease)   . History of bronchitis   . History of hiatal hernia   . Hyperlipidemia   . Hypertension   . Sleep apnea    Use C- PAP  . Urinary incontinence   . Varicose veins     Past Surgical History:  Procedure Laterality Date  . CARDIAC CATHETERIZATION    . CHOLECYSTECTOMY    . COLONOSCOPY WITH PROPOFOL N/A 06/14/2016   Procedure: COLONOSCOPY WITH PROPOFOL;  Surgeon: Lollie Sails, MD;  Location: [facility] ENDOSCOPY;  Service: Endoscopy;  Laterality: N/A;  . ESOPHAGOGASTRODUODENOSCOPY (EGD) WITH PROPOFOL N/A 06/14/2016   Procedure:  ESOPHAGOGASTRODUODENOSCOPY (EGD) WITH PROPOFOL;  Surgeon: Lollie Sails, MD;  Location: [facility] ENDOSCOPY;  Service: Endoscopy;  Laterality: N/A;  . ESOPHAGOGASTRODUODENOSCOPY (EGD) WITH PROPOFOL N/A 06/12/2017   Procedure: ESOPHAGOGASTRODUODENOSCOPY (EGD) WITH PROPOFOL;  Surgeon: Lollie Sails, MD;  Location: [facility] ENDOSCOPY;  Service: Endoscopy;  Laterality: N/A;  . ESOPHAGOGASTRODUODENOSCOPY (EGD) WITH PROPOFOL N/A 08/25/2017   Procedure: ESOPHAGOGASTRODUODENOSCOPY (EGD) WITH PROPOFOL;  Surgeon: Lollie Sails, MD;  Location: [facility] ENDOSCOPY;  Service: Endoscopy;  Laterality: N/A;  . HYSTEROSCOPY WITH D & C N/A 01/02/2017   Procedure: DILATATION AND CURETTAGE /HYSTEROSCOPY;  Surgeon: Brayton Mars, MD;  Location: [facility] ORS;  Service: Gynecology;  Laterality: N/A;  . JOINT REPLACEMENT Right 2007   Total Knee Replacement  . JOINT REPLACEMENT Left 2004   Total Knee Replacement  . LAPAROSCOPIC HYSTERECTOMY Bilateral 03/01/2017   Procedure: HYSTERECTOMY TOTAL LAPAROSCOPIC BSO;  Surgeon: Mellody Drown, MD;  Location: [facility] ORS;  Service: Gynecology;  Laterality: Bilateral;  . ROTATOR CUFF REPAIR Right   . SENTINEL NODE BIOPSY N/A 03/01/2017   Procedure: SENTINEL NODE INJECTION AND BIOPSY;  Surgeon: Mellody Drown, MD;  Location: [facility] ORS;  Service: Gynecology;  Laterality: N/A;  . TONSILLECTOMY     as a child  .  TUBAL LIGATION    . UPPER GI ENDOSCOPY     x2    There were no vitals filed for this visit.   Subjective Assessment - 01/28/20 0912    Subjective  Patient reports she started having burning in her R anterior thigh (not distal to knee or into groin) after her hysterectomy for uterine cancer in 2018. She states this has gradually gotten worse and is now causing pain in the same region. It now feels like a deep "bone pain." Pain is not present when she gets up in the morning or after she has not done as much. Only feels it once in a while during the day. Feels the most when  she sits down and puts legs up. Feels like numbness and tingling. Does not wake her up at night. She goes to the gym 3x a week or more (doing walking exercise with other seniors). Has not noticed that it hurts more or less on those days. Is pretty busy most of the time.    Pertinent History  Patient is a 74 y.o. 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 This patient's chief complaints consist of gradually worsening intermittent anterior L thigh pain that started with her hysterectomy surgery in 03/01/2017 leading to the following functional deficits: causes difficulty completing usual daily activities and pain seems proportional to the amount of activity she does each day. Usual activities include ADLs, IADLs, exercise at least 3x per week, spending time with family, keeping up her home, and engaging in hobbies such as hiking and crochet. Relevant past medical history and comorbidities include diabetes with peripheral neuropathy, sleep apnea, history of uterine cancer (followed by radiologist, OBGYN, and oncologist), diverticulosis, bilateral TKA, right rotator cuff repair, Barret's Esophagus, history of chronic lumbar and left hip pain (no spinal surgery), asthma and bronchitis, hx of neck pain.  Patient denies hx of stroke, seizures, lung problem (aside from above), major cardiac events, unexplained weight loss, changes in bowel or bladder problems associated with onset of chief complaint, new onset stumbling or dropping things except for right hand R hand is stiff (denies paresthesia).    Limitations  Sitting;House hold activities;Other (comment);Walking   causes difficulty completing usual daily activities and pain seems proportional to the amount of activity she does each day. Usual activities include ADLs, IADLs, exercise at least 3x per week, spending time w/family, housework, hobbies such as hiking.    Diagnostic tests  HIp x-ray 09/2018: "IMPRESSION:There is no acute fracture or dislocation of the left hip. There ismild degenerative joint space loss. Of note is degenerative changeof the SI joints and pubic symphysis."    Patient Stated Goals  "that it won't go any further"    Currently in Pain?  Yes   worst: 8/10; best: 0/10   Pain Score  1     Pain Location  Leg   anterior L thigh   Pain Orientation  Left;Upper;Anterior    Pain Descriptors / Indicators  Burning;Aching   paresthesia   Pain Type  Chronic pain;Neuropathic pain    Pain Radiating Towards  does not radiate below knee or into groin    Pain Onset  More than a month ago    Pain Frequency  Intermittent    Aggravating Factors   worse in the evening, gets worse the more activity she does, sititng in the evening,    Pain Relieving Factors  rubbing the region,  not doing as much, when distracted    Effect of Pain on Daily Activities  bothers her in the evening when trying to relax, Aches after activity so it requires her to "work through it" or suffer the consequences of doing her usual activity. causes difficulty completing usual daily activities and pain seems proportional to the amount of activity she does each day. Usual activities include ADLs, IADLs, exercise at least 3x per week, spending time with family, keeping up her home, and engaging in hobbies such as hiking and crochet.         [facility] PT Assessment - 01/28/20 0001      Assessment   Medical Diagnosis  Anterior femoral cutaneous neuropathy of left lower extremity    Referring Provider (PT)  Rubie Maid, MD    Onset Date/Surgical Date  03/01/17    Next MD Visit  yearly    Prior Therapy  none for this problem prior to current episode of care      Precautions   Precautions  None      Restrictions   Weight Bearing Restrictions  No      Balance Screen   Has the patient fallen in the past 6 months  No    Has the patient had a decrease in activity level because of  a fear of falling?   Yes   going to gym to prevent falls   Is the patient reluctant to leave their home because of a fear of falling?   No      Home Environment   Living Environment  --   no concerns about getting around home     Prior Function   Level of Independence  Independent    Vocation  Retired    Biomedical scientist  school systom in [place]  exercise, crochet, drawing,  spending time with family      Cognition   Overall Cognitive Status  Within Functional Limits for tasks assessed      Observation/Other Assessments   Observations  see note from 01/28/2020 for latest objective data    Focus on Therapeutic Outcomes (FOTO)   99        OBJECTIVE  OBSERVATION/INSPECTION . Compression hose bilaterally, B genuvalgum and everted feet.  . Bed mobility: supine <> sit and rolling I  . Transfers: sit <> stand I . Gait: grossly WFL for household and short community ambulation. More detailed gait analysis deferred to later date as needed.  . Stairs   NEUROLOGICAL  Upper Motor Neuron Screen Hoffman's, and Clonus (ankle) negative bilaterally Dermatomes . L2-S2 appears diminished to light touch on L compared to right except at L4. Myotomes . L2-S2 appears intact Deep Tendon Reflexes R/L  . 3+/3+ Quadriceps reflex (L4) . 2+/2+ Achilles reflex (S1) Neurodynamic Tests   . SLR positive bilaterally . Sideying prone knee bend (femoral nerve) positive bilaterally, L > R   SPINE MOTION Lumbar AROM with overpressure *Indicates pain - WNL all directions with no reproduction of pain with OP  Thoracic AROM with overpressure  - thoracic extension: painful in lower thoracic spine with and without OP, no reproduction of pain in thigh. Significant movement loss.   PERIPHERAL JOINT MOTION (in degrees) LE AROM and PROM BLE WFL for basic mobility  MUSCLE PERFORMANCE (MMT):  *Indicates pain 01/28/2020 Date Date  Joint/Motion R/L R/L R/L  Hip     Flexion 4/4 / /    Extension (knee ext) 4/4 / /  Abduction 4+/4+ / /  Adduction 4-/4- / /  Knee     Extension 5/5 / /  Flexion 5/5 / /  Ankle/Foot     Dorsiflexion  5/5 / /  Great toe extension 5/5 / /  Eversion 5/5 / /    SPECIAL TESTS: Sustained Positioning: prone on elbows x 1 min, no effect Straight leg raise (SLR): B = positive for posterior leg pain Femoral nerve tension: B = positive L > R  Hip scour: B = negative FABER: B = positive for lateral hip pain  ACCESSORY MOTION:  - CPA at upper lumbar spine painful locally - CPA over thoracic and lumbar spine hypomobile  EDUCATION/COGNITION: Patient is alert and oriented X 4.  Objective measurements completed on examination: See above findings.    TREATMENT:   Therapeutic exercise: to centralize symptoms and improve ROM, strength, muscular endurance, and activity tolerance required for successful completion of functional activities.  - supine L hip flexor stretch off edge of table (unable to get effective stretch) - standing L hip flexor stretch, 3x30 seconds at edge of table - standing L hip flexion with knee flexed, x 10 with 2secH - Education on diagnosis, prognosis, POC, anatomy and physiology of current condition.  - Education on HEP including handout   Manual therapy: to reduce pain and tissue tension, improve range of motion, neuromodulation, in order to promote improved ability to complete functional activities. Prone CPA grade III, x 10 to tender segments at upper lumbar spine, ERP that decreased as continued mobilizations, sore after.    Access Code: [email or web address] URL: [email or web address] Date: 01/28/2020 Prepared by: Rosita Kea  Exercises Standing Hip Flexion - 1-2 x daily - 2-3 sets - 10 reps - 2 seconds hold   Patient response to treatment:  Pt tolerated treatment well. Pt was able to complete all exercises with mild soreness in low back by end of session. Pt required multimodal cuing for proper technique and  to facilitate improved neuromuscular control, strength, range of motion, and functional ability resulting in improved performance and form.    PT Education - 01/28/20 1037    Education Details  Exercise purpose/form. Self management techniques. Education on diagnosis, prognosis, POC, anatomy and physiology of current condition Education on HEP including handout    Person(s) Educated  Patient    Methods  Explanation;Demonstration;Tactile cues;Verbal cues;Handout    Comprehension  Verbalized understanding;Returned demonstration;Verbal cues required;Tactile cues required;Need further instruction       PT Short Term Goals - 01/28/20 1057      PT SHORT TERM GOAL #1   Title  Be independent with initial home exercise program for self-management of symptoms.    Baseline  iniital HEP provided at IE (01/28/2020);    Time  2    Period  Weeks    Status  New    Target Date  02/11/20        PT Long Term Goals - 01/28/20 1103      PT LONG TERM GOAL #1   Title  Be independent with a long-term home exercise program for self-management of symptoms.     Baseline  iniital HEP provided at IE (01/28/2020);    Time  12    Period  Weeks    Status  New    Target Date  --   TARGET DATE FOR ALL LONG TERM GOALS: 04/21/2020     PT LONG TERM GOAL #2   Title  Reduce pain with functional activities to equal or  less than 1/10 to allow patient to complete usual activities including ADLs, IADLs, and social engagement with less difficulty.    Baseline  up to 8/10 (01/28/2020);    Time  12    Period  Weeks    Status  New      PT LONG TERM GOAL #3   Title  Patient will demonstrate B LE strength 5/5 to demonstrate functional strength for independent gait, increased standing tolerance, lifting, carrying and increased ADL ability.    Baseline  see objective exam (01/28/2020);    Time  12    Period  Weeks    Status  New      PT LONG TERM GOAL #4   Title  Complete community, work and/or recreational activities without  limitation due to current condition.     Baseline  limiting ability to complete completing usual daily activities and pain seems proportional to the amount of activity she does each day, includes ADLs, IADLs, exercise at least 3x per week, spending time with family, keeping up her home, relaxing, and engaging in hobbies such as hiking and crochet without difficulty (01/28/2020);    Time  12    Period  Weeks    Status  New             Plan - 01/28/20 1045    Clinical Impression Statement  Patient is a 74 y.o. female referred to outpatient physical therapy with a medical diagnosis of anterior femoral cutaneous neuropathy of left lower extremit who presents with signs and symptoms consistent with chronic intermittent anterior thigh pain that appears neurogenic in nature. Patient positive for femoral nerve tension test (sidelying prone knee bend test) and no effect on symptoms with lumbar screen suggests peripheral entrapment/site of irritation vs lumbar but will continue to monitor. Stiffness and pain is present in upper lumbar spine where femoral nerve is supplied. Patient presents with significant pain, activity tolerance, joint stiffness, neural tension, diminished sensation, muscle performance (power/endurance/strength) paresthesia  impairments that are limiting ability to complete completing usual daily activities and pain seems proportional to the amount of activity she does each day, includes ADLs, IADLs, exercise at least 3x per week, spending time with family, keeping up her home, relaxing, and engaging in hobbies such as hiking and crochet without difficulty. Patient will benefit from skilled physical therapy intervention to address current body structure impairments and activity limitations to improve function and work towards goals set in current POC in order to return to prior level of function or maximal functional improvement.    Personal Factors and Comorbidities  Age;Comorbidity  3+;Sex;Time since onset of injury/illness/exacerbation;Past/Current Experience    Comorbidities  Relevant past medical history and comorbidities include diabetes with peripheral neuropathy, sleep apnea, history of uterine cancer (followed by radiologist, OBGYN, and oncologist), diverticulosis, bilateral TKA, right rotator cuff repair, Barret's Esophagus, history of chronic lumbar and left hip pain (no spinal surgery), asthma and bronchitis, hx of neck pain.    Examination-Activity Limitations  Squat;Stairs;Other   causes difficulty completing usual daily activities and pain seems proportional to the amount of activity she does each day, includes ADLs, IADLs, exercise at least 3x per week, spending time with family, keeping up her home,   Examination-Participation Restrictions  Community Activity;Yard Work;Cleaning;Shop;Laundry;Interpersonal Relationship;Other   hiking, crochet   Stability/Clinical Decision Making  Evolving/Moderate complexity    Clinical Decision Making  Moderate    Rehab Potential  Fair    PT Frequency  2x / week    PT  Duration  12 weeks   up to 12 weeks as needed   PT Treatment/Interventions  ADLs/Self Care Home Management;Biofeedback;Cryotherapy;Moist Heat;Therapeutic activities;Therapeutic exercise;Neuromuscular re-education;Patient/family education;Manual techniques;Passive range of motion;Dry needling;Spinal Manipulations;Joint Manipulations    PT Next Visit Plan  assess response to HEP and update as appropriate. Strengthening and stretching of L thigh, neural mobilization, consider explorying repeated movements    PT Home Exercise Plan  Medbridge Access Code: [email or web address], [email or web address] hip flexor stretch    Recommended Other Services  continue cancer surveilance with medical team    Consulted and Agree with Plan of Care  Patient       Patient will benefit from skilled therapeutic intervention in order to improve the following deficits and impairments:  Pain, Decreased activity  tolerance, Decreased range of motion, Impaired perceived functional ability, Obesity, Increased edema, Decreased strength, Hypomobility, Increased muscle spasms, Decreased endurance  Visit Diagnosis: Pain in left thigh  Unspecified disturbances of skin sensation  Muscle weakness (generalized)     Problem List Patient Active Problem List   Diagnosis Date Noted  . Bronchitis 01/22/2020  . Hepatic steatosis 07/18/2019  . Precordial pain 07/03/2019  . Epigastric pain 07/03/2019  . Vertigo, labyrinthine, unspecified laterality 03/30/2019  . Chronic hip pain, left 09/29/2018  . Pain of right tibia 09/29/2018  . Hyperlipidemia associated with type 2 diabetes mellitus ([place]) 03/29/2018  . Bilateral pes planus 03/29/2018  . SUI (stress urinary incontinence, female) 11/23/2017  . Vaginal atrophy 11/23/2017  . Obesity (BMI 30.0-34.9) 06/20/2017  . Carpal tunnel syndrome on right 06/20/2017  . Endometrioid adenocarcinoma of uterus ([place]) 01/19/2017  . OSA on CPAP 11/24/2016  . Cyst of ovary 11/16/2016  . Cervical stenosis (uterine cervix) 11/16/2016  . CKD (chronic kidney disease) stage 3, GFR 30-59 ml/min 11/07/2014  . GERD (gastroesophageal reflux disease) 12/26/2013  . Irritable bowel syndrome with constipation 12/26/2013  . Encounter for preventive health examination 10/21/2013  . Hyperlipidemia LDL goal <100 07/15/2013  . Chronic venous insufficiency 03/21/2013  . DM (diabetes mellitus), type 2 with renal complications ([place]) [id number]  . Lack of libido 11/05/2012  . Barrett's esophagus 11/05/2012  . Hiatal hernia 11/05/2012  . Essential hypertension 11/05/2012    Everlean Alstrom. Graylon Good, PT, DPT 01/28/20, 11:11 AM  [facility] [address] [place], [address] Phone: [phone]   Fax:  [phone]  Name: Sandra Hendrix MRN: [id number] Date of Birth: [birth year]

## 2020-01-30 ENCOUNTER — Ambulatory Visit: Payer: Medicare PPO | Admitting: Physical Therapy

## 2020-01-30 ENCOUNTER — Encounter: Payer: Self-pay | Admitting: Physical Therapy

## 2020-01-30 ENCOUNTER — Other Ambulatory Visit: Payer: Self-pay

## 2020-01-30 DIAGNOSIS — M6281 Muscle weakness (generalized): Secondary | ICD-10-CM

## 2020-01-30 DIAGNOSIS — R209 Unspecified disturbances of skin sensation: Secondary | ICD-10-CM

## 2020-01-30 DIAGNOSIS — M79652 Pain in left thigh: Secondary | ICD-10-CM

## 2020-01-30 NOTE — Therapy (Signed)
[facility] [facility] [address], [place], [address] Phone: [phone]   Fax:  [phone]  Physical Therapy Treatment  Patient Details  Name: Sandra Hendrix MRN: [id number] Date of Birth: December 03, 1945 Referring Provider (PT): Rubie Maid, MD   Encounter Date: 01/30/2020  PT End of Session - 01/30/20 1701    Visit Number  2    Number of Visits  24   as needed   Date for PT Re-Evaluation  04/21/20    Authorization Type  [employer] reporting period from 01/28/2020    Authorization Time [place] 24 visits 01/28/20 - 04/24/20 [id number]    Authorization - Visit Number  2    Authorization - Number of Visits  24    Progress Note Due on Visit  10    PT Start Time  1300    PT Stop Time  1340    PT Time Calculation (min)  40 min    Activity Tolerance  Patient tolerated treatment well    Behavior During Therapy  [facility] for tasks assessed/performed       Past Medical History:  Diagnosis Date  . Allergy   . Anxiety   . Arthritis   . Barrett's esophagus determined by endoscopy   . Cancer [facility])    Uterine Cancer  . Chronic kidney disease   . Depression   . Diabetes mellitus without complication ([place])   . Difficult intubation    "not sure what happened,asleep was told had problem with intubation"  . Diverticulosis   . Fatty liver   . GERD (gastroesophageal reflux disease)   . History of bronchitis   . History of hiatal hernia   . Hyperlipidemia   . Hypertension   . Sleep apnea    Use C- PAP  . Urinary incontinence   . Varicose veins     Past Surgical History:  Procedure Laterality Date  . CARDIAC CATHETERIZATION    . CHOLECYSTECTOMY    . COLONOSCOPY WITH PROPOFOL N/A 06/14/2016   Procedure: COLONOSCOPY WITH PROPOFOL;  Surgeon: Lollie Sails, MD;  Location: [facility] ENDOSCOPY;  Service: Endoscopy;  Laterality: N/A;  . ESOPHAGOGASTRODUODENOSCOPY (EGD) WITH PROPOFOL N/A 06/14/2016   Procedure:  ESOPHAGOGASTRODUODENOSCOPY (EGD) WITH PROPOFOL;  Surgeon: Lollie Sails, MD;  Location: [facility] ENDOSCOPY;  Service: Endoscopy;  Laterality: N/A;  . ESOPHAGOGASTRODUODENOSCOPY (EGD) WITH PROPOFOL N/A 06/12/2017   Procedure: ESOPHAGOGASTRODUODENOSCOPY (EGD) WITH PROPOFOL;  Surgeon: Lollie Sails, MD;  Location: [facility] ENDOSCOPY;  Service: Endoscopy;  Laterality: N/A;  . ESOPHAGOGASTRODUODENOSCOPY (EGD) WITH PROPOFOL N/A 08/25/2017   Procedure: ESOPHAGOGASTRODUODENOSCOPY (EGD) WITH PROPOFOL;  Surgeon: Lollie Sails, MD;  Location: [facility] ENDOSCOPY;  Service: Endoscopy;  Laterality: N/A;  . HYSTEROSCOPY WITH D & C N/A 01/02/2017   Procedure: DILATATION AND CURETTAGE /HYSTEROSCOPY;  Surgeon: Brayton Mars, MD;  Location: [facility] ORS;  Service: Gynecology;  Laterality: N/A;  . JOINT REPLACEMENT Right 2007   Total Knee Replacement  . JOINT REPLACEMENT Left 2004   Total Knee Replacement  . LAPAROSCOPIC HYSTERECTOMY Bilateral 03/01/2017   Procedure: HYSTERECTOMY TOTAL LAPAROSCOPIC BSO;  Surgeon: Mellody Drown, MD;  Location: [facility] ORS;  Service: Gynecology;  Laterality: Bilateral;  . ROTATOR CUFF REPAIR Right   . SENTINEL NODE BIOPSY N/A 03/01/2017   Procedure: SENTINEL NODE INJECTION AND BIOPSY;  Surgeon: Mellody Drown, MD;  Location: [facility] ORS;  Service: Gynecology;  Laterality: N/A;  . TONSILLECTOMY     as a child  . TUBAL  LIGATION    . UPPER GI ENDOSCOPY     x2    There were no vitals filed for this visit.  Subjective Assessment - 01/30/20 1307    Subjective  Patient reports she is feeling well today. She had some pain last night after exercising and being active yesterday. She noticed no difference in her symptoms following last treatment session except some back soreness. Patient states she couldn't find her exercise handout and did not do the HEP because she could not remember it.    Pertinent History  Patient is a 74 y.o. 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 This patient's chief complaints consist of gradually worsening intermittent anterior L thigh pain that started with her hysterectomy surgery in 03/01/2017 leading to the following functional deficits: causes difficulty completing usual daily activities and pain seems proportional to the amount of activity she does each day. Usual activities include ADLs, IADLs, exercise at least 3x per week, spending time with family, keeping up her home, and engaging in hobbies such as hiking and crochet. Relevant past medical history and comorbidities include diabetes with peripheral neuropathy, sleep apnea, history of uterine cancer (followed by radiologist, OBGYN, and oncologist), diverticulosis, bilateral TKA, right rotator cuff repair, Barret's Esophagus, history of chronic lumbar and left hip pain (no spinal surgery), asthma and bronchitis, hx of neck pain.  Patient denies hx of stroke, seizures, lung problem (aside from above), major cardiac events, unexplained weight loss, changes in bowel or bladder problems associated with onset of chief complaint, new onset stumbling or dropping things except for right hand R hand is stiff (denies paresthesia).    Limitations  Sitting;House hold activities;Other (comment);Walking   causes difficulty completing usual daily activities and pain seems proportional to the amount of activity she does each day. Usual activities include ADLs, IADLs, exercise at least 3x per week, spending time w/family, housework, hobbies such as hiking.   Diagnostic tests  HIp x-ray 09/2018: "IMPRESSION:There is no acute fracture or dislocation of the left hip. There ismild degenerative joint space loss. Of note is degenerative changeof the SI joints and pubic symphysis."    Patient Stated Goals  "that it won't go any further"    Currently in Pain?  No/denies    Pain Onset  More than a month ago       TREATMENT:    Therapeutic exercise:to centralize symptoms and improve ROM, strength, muscular endurance, and activity tolerance required for successful completion of functional activities.  - standing L hip flexor stretch, 20 x 3 second hold seconds at edge of table - standing L hip flexion with knee flexed, x 10 with no weight, x10 with 2# AW, 2x10 with 4# AW.  - prone L femoral nerve mobilization x 15 (heel to glute) - Prone hip extension with knee flexed to preferentially bias gluteus maximus. 2x10 Cuing to keep knee flexed.  - seated MDT thoracic extension x10 - Education on HEP including handout   Manual therapy: to reduce pain and tissue tension, improve range of motion, neuromodulation, in order to promote improved ability to complete functional activities. - Prone hip flexor stretch using belt. R and L 2x30 seconds each - supine STM to L hip flexors and quads to decrease tension.   HOME EXERCISE PROGRAM Access Code: [email or web address] URL: [email or web address] Date: 01/28/2020 Prepared by: Rosita Kea  Exercises Standing Hip Flexion - 1-2 x daily - 2-3 sets - 10  reps - 2 seconds hold      PT Education - 01/30/20 1703    Education Details  Exercise purpose/form. Self management techniques. HEP    Person(s) Educated  Patient    Methods  Explanation;Demonstration;Tactile cues;Verbal cues;Handout    Comprehension  Verbalized understanding;Returned demonstration;Verbal cues required;Tactile cues required;Need further instruction       PT Short Term Goals - 01/28/20 1057      PT SHORT TERM GOAL #1   Title  Be independent with initial home exercise program for self-management of symptoms.    Baseline  iniital HEP provided at IE (01/28/2020);    Time  2    Period  Weeks    Status  New    Target Date  02/11/20        PT Long Term Goals - 01/28/20 1103      PT LONG TERM GOAL #1   Title  Be independent with a long-term home exercise program for self-management of symptoms.      Baseline  iniital HEP provided at IE (01/28/2020);    Time  12    Period  Weeks    Status  New    Target Date  --   TARGET DATE FOR ALL LONG TERM GOALS: 04/21/2020     PT LONG TERM GOAL #2   Title  Reduce pain with functional activities to equal or less than 1/10 to allow patient to complete usual activities including ADLs, IADLs, and social engagement with less difficulty.    Baseline  up to 8/10 (01/28/2020);    Time  12    Period  Weeks    Status  New      PT LONG TERM GOAL #3   Title  Patient will demonstrate B LE strength 5/5 to demonstrate functional strength for independent gait, increased standing tolerance, lifting, carrying and increased ADL ability.    Baseline  see objective exam (01/28/2020);    Time  12    Period  Weeks    Status  New      PT LONG TERM GOAL #4   Title  Complete community, work and/or recreational activities without limitation due to current condition.     Baseline  limiting ability to complete completing usual daily activities and pain seems proportional to the amount of activity she does each day, includes ADLs, IADLs, exercise at least 3x per week, spending time with family, keeping up her home, relaxing, and engaging in hobbies such as hiking and crochet without difficulty (01/28/2020);    Time  12    Period  Weeks    Status  New            Plan - 01/30/20 1705    Clinical Impression Statement  Patient tolerated treatment well overall and did not report any symptoms throughout session. Focused on stretching and strengthening hip flexors as well as glute activation to improve contribution to hip efficacy. Patient had more difficulty activating glutes on left compared to right. Reviewed HEP and provided new handout. Patient would benefit from continued management of limiting condition by skilled physical therapist to address remaining impairments and functional limitations to work towards stated goals and return to PLOF or maximal functional  independence.    Personal Factors and Comorbidities  Age;Comorbidity 3+;Sex;Time since onset of injury/illness/exacerbation;Past/Current Experience    Comorbidities  Relevant past medical history and comorbidities include diabetes with peripheral neuropathy, sleep apnea, history of uterine cancer (followed by radiologist, OBGYN, and oncologist), diverticulosis, bilateral TKA,  right rotator cuff repair, Barret's Esophagus, history of chronic lumbar and left hip pain (no spinal surgery), asthma and bronchitis, hx of neck pain.    Examination-Activity Limitations  Squat;Stairs;Other   causes difficulty completing usual daily activities and pain seems proportional to the amount of activity she does each day, includes ADLs, IADLs, exercise at least 3x per week, spending time with family, keeping up her home,   Examination-Participation Restrictions  Community Activity;Yard Work;Cleaning;Shop;Laundry;Interpersonal Relationship;Other   hiking, crochet   Stability/Clinical Decision Making  Evolving/Moderate complexity    Rehab Potential  Fair    PT Frequency  2x / week    PT Duration  12 weeks   up to 12 weeks as needed   PT Treatment/Interventions  ADLs/Self Care Home Management;Biofeedback;Cryotherapy;Moist Heat;Therapeutic activities;Therapeutic exercise;Neuromuscular re-education;Patient/family education;Manual techniques;Passive range of motion;Dry needling;Spinal Manipulations;Joint Manipulations    PT Next Visit Plan  assess response to HEP and update as appropriate. Strengthening and stretching of L thigh, neural mobilization, consider explorying repeated movements    PT Home Exercise Plan  Medbridge Access Code: [email or web address], [email or web address] hip flexor stretch    Consulted and Agree with Plan of Care  Patient       Patient will benefit from skilled therapeutic intervention in order to improve the following deficits and impairments:  Pain, Decreased activity tolerance, Decreased range of motion, Impaired  perceived functional ability, Obesity, Increased edema, Decreased strength, Hypomobility, Increased muscle spasms, Decreased endurance  Visit Diagnosis: Pain in left thigh  Unspecified disturbances of skin sensation  Muscle weakness (generalized)     Problem List Patient Active Problem List   Diagnosis Date Noted  . Bronchitis 01/22/2020  . Hepatic steatosis 07/18/2019  . Precordial pain 07/03/2019  . Epigastric pain 07/03/2019  . Vertigo, labyrinthine, unspecified laterality 03/30/2019  . Chronic hip pain, left 09/29/2018  . Pain of right tibia 09/29/2018  . Hyperlipidemia associated with type 2 diabetes mellitus ([place]) 03/29/2018  . Bilateral pes planus 03/29/2018  . SUI (stress urinary incontinence, female) 11/23/2017  . Vaginal atrophy 11/23/2017  . Obesity (BMI 30.0-34.9) 06/20/2017  . Carpal tunnel syndrome on right 06/20/2017  . Endometrioid adenocarcinoma of uterus ([place]) 01/19/2017  . OSA on CPAP 11/24/2016  . Cyst of ovary 11/16/2016  . Cervical stenosis (uterine cervix) 11/16/2016  . CKD (chronic kidney disease) stage 3, GFR 30-59 ml/min 11/07/2014  . GERD (gastroesophageal reflux disease) 12/26/2013  . Irritable bowel syndrome with constipation 12/26/2013  . Encounter for preventive health examination 10/21/2013  . Hyperlipidemia LDL goal <100 07/15/2013  . Chronic venous insufficiency 03/21/2013  . DM (diabetes mellitus), type 2 with renal complications ([place]) [id number]  . Lack of libido 11/05/2012  . Barrett's esophagus 11/05/2012  . Hiatal hernia 11/05/2012  . Essential hypertension 11/05/2012    Everlean Alstrom. Graylon Good, PT, DPT 01/30/20, 5:06 PM  [facility] [address], [place], [address] Phone: [phone]   Fax:  [phone]  Name: Sandra Hendrix MRN: [id number] Date of Birth: [birth year]

## 2020-02-04 ENCOUNTER — Ambulatory Visit: Payer: Medicare PPO | Admitting: Physical Therapy

## 2020-02-06 ENCOUNTER — Encounter: Payer: Medicare PPO | Admitting: Physical Therapy

## 2020-02-10 ENCOUNTER — Ambulatory Visit: Payer: Self-pay | Admitting: Urology

## 2020-02-11 ENCOUNTER — Encounter: Payer: Medicare PPO | Admitting: Physical Therapy

## 2020-02-13 ENCOUNTER — Encounter: Payer: Medicare PPO | Admitting: Physical Therapy

## 2020-02-14 DIAGNOSIS — M79641 Pain in right hand: Secondary | ICD-10-CM | POA: Diagnosis not present

## 2020-02-14 DIAGNOSIS — M25532 Pain in left wrist: Secondary | ICD-10-CM | POA: Diagnosis not present

## 2020-02-17 ENCOUNTER — Ambulatory Visit: Payer: Self-pay | Admitting: Urology

## 2020-02-18 ENCOUNTER — Ambulatory Visit: Payer: Medicare PPO | Admitting: Physical Therapy

## 2020-02-18 ENCOUNTER — Other Ambulatory Visit: Payer: Self-pay

## 2020-02-18 ENCOUNTER — Encounter: Payer: Self-pay | Admitting: Physical Therapy

## 2020-02-18 DIAGNOSIS — M79652 Pain in left thigh: Secondary | ICD-10-CM | POA: Diagnosis not present

## 2020-02-18 DIAGNOSIS — M6281 Muscle weakness (generalized): Secondary | ICD-10-CM | POA: Diagnosis not present

## 2020-02-18 DIAGNOSIS — R209 Unspecified disturbances of skin sensation: Secondary | ICD-10-CM | POA: Diagnosis not present

## 2020-02-18 NOTE — Therapy (Signed)
[facility] [address] [place], [address] Phone: [phone]   Fax:  [phone]  Physical Therapy Treatment  Patient Details  Name: Sandra Hendrix MRN: [id number] Date of Birth: 12/23/1945 Referring Provider (PT): Rubie Maid, MD   Encounter Date: 02/18/2020  PT End of Session - 02/18/20 1351    Visit Number  3    Number of Visits  24   as needed   Date for PT Re-Evaluation  04/21/20    Authorization Type  [employer] reporting period from 01/28/2020    Authorization Time [place] 24 visits 01/28/20 - 04/24/20 [id number]    Authorization - Visit Number  3    Authorization - Number of Visits  24    Progress Note Due on Visit  10    PT Start Time  1123    PT Stop Time  1205    PT Time Calculation (min)  42 min    Equipment Utilized During Treatment  Gait belt    Activity Tolerance  Patient tolerated treatment well    Behavior During Therapy  WFL for tasks assessed/performed       Past Medical History:  Diagnosis Date  . Allergy   . Anxiety   . Arthritis   . Barrett's esophagus determined by endoscopy   . Cancer [facility])    Uterine Cancer  . Chronic kidney disease   . Depression   . Diabetes mellitus without complication ([place])   . Difficult intubation    "not sure what happened,asleep was told had problem with intubation"  . Diverticulosis   . Fatty liver   . GERD (gastroesophageal reflux disease)   . History of bronchitis   . History of hiatal hernia   . Hyperlipidemia   . Hypertension   . Sleep apnea    Use C- PAP  . Urinary incontinence   . Varicose veins     Past Surgical History:  Procedure Laterality Date  . CARDIAC CATHETERIZATION    . CHOLECYSTECTOMY    . COLONOSCOPY WITH PROPOFOL N/A 06/14/2016   Procedure: COLONOSCOPY WITH PROPOFOL;  Surgeon: Lollie Sails, MD;  Location: [facility] ENDOSCOPY;  Service: Endoscopy;  Laterality: N/A;  .  ESOPHAGOGASTRODUODENOSCOPY (EGD) WITH PROPOFOL N/A 06/14/2016   Procedure: ESOPHAGOGASTRODUODENOSCOPY (EGD) WITH PROPOFOL;  Surgeon: Lollie Sails, MD;  Location: [facility] ENDOSCOPY;  Service: Endoscopy;  Laterality: N/A;  . ESOPHAGOGASTRODUODENOSCOPY (EGD) WITH PROPOFOL N/A 06/12/2017   Procedure: ESOPHAGOGASTRODUODENOSCOPY (EGD) WITH PROPOFOL;  Surgeon: Lollie Sails, MD;  Location: [facility] ENDOSCOPY;  Service: Endoscopy;  Laterality: N/A;  . ESOPHAGOGASTRODUODENOSCOPY (EGD) WITH PROPOFOL N/A 08/25/2017   Procedure: ESOPHAGOGASTRODUODENOSCOPY (EGD) WITH PROPOFOL;  Surgeon: Lollie Sails, MD;  Location: [facility] ENDOSCOPY;  Service: Endoscopy;  Laterality: N/A;  . HYSTEROSCOPY WITH D & C N/A 01/02/2017   Procedure: DILATATION AND CURETTAGE /HYSTEROSCOPY;  Surgeon: Brayton Mars, MD;  Location: [facility] ORS;  Service: Gynecology;  Laterality: N/A;  . JOINT REPLACEMENT Right 2007   Total Knee Replacement  . JOINT REPLACEMENT Left 2004   Total Knee Replacement  . LAPAROSCOPIC HYSTERECTOMY Bilateral 03/01/2017   Procedure: HYSTERECTOMY TOTAL LAPAROSCOPIC BSO;  Surgeon: Mellody Drown, MD;  Location: [facility] ORS;  Service: Gynecology;  Laterality: Bilateral;  . ROTATOR CUFF REPAIR Right   . SENTINEL NODE BIOPSY N/A 03/01/2017   Procedure: SENTINEL NODE INJECTION AND BIOPSY;  Surgeon: Mellody Drown, MD;  Location: [facility] ORS;  Service: Gynecology;  Laterality: N/A;  . TONSILLECTOMY  as a child  . TUBAL LIGATION    . UPPER GI ENDOSCOPY     x2    There were no vitals filed for this visit.  Subjective Assessment - 02/18/20 1126    Subjective  Patient reports they did a lot of hiking and walking on her recent vacation. Her leg feels better after increasing her walking. she fell twice and injured her wrists. She wishes she took some walking sticks. She did not break anything but her R nuckles were so swollen that she could not use her middle finger. It is getting better. She saw a doctor who gave  her braces to wear. Her hands are getitng better gradually. She cannot lift anything heavy right now which is what bothers her the most. She walked 4-5 miles every day. It was very strenuous and her stamina was better than her husband. She doesn't feel as steady as she was and she is not sure if this is related to fear of falling or if she is actually more unsteady. Both falls happened when she was stepping over large rocks on an incline (going down the hill). She doens't think the rock tripped her but that she was likely unsteady when she was trying to step over that high. She fell straight forward onto her face for one fall and the other she rolled more. She hasn't been exercising because he cannot use her hands as much. She is hoping to go back tomorrow to exercise class and just not use her hands. Also has follow up with doctor tomorrow about hands. She cannot remember how long it has been since her leg hurt. It stopped hurting before she left for vacation and did not hurt during or after vacation. The only thing that is bothering is some cramping in her left calf (standing a lot and walking in house catching up on wash). Ice and rest improves it.    Pertinent History  Patient is a 74 y.o. 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 This patient's chief complaints consist of gradually worsening intermittent anterior L thigh pain that started with her hysterectomy surgery in 03/01/2017 leading to the following functional deficits: causes difficulty completing usual daily activities and pain seems proportional to the amount of activity she does each day. Usual activities include ADLs, IADLs, exercise at least 3x per week, spending time with family, keeping up her home, and engaging in hobbies such as hiking and crochet. Relevant past medical history and comorbidities include diabetes with peripheral  neuropathy, sleep apnea, history of uterine cancer (followed by radiologist, OBGYN, and oncologist), diverticulosis, bilateral TKA, right rotator cuff repair, Barret's Esophagus, history of chronic lumbar and left hip pain (no spinal surgery), asthma and bronchitis, hx of neck pain.  Patient denies hx of stroke, seizures, lung problem (aside from above), major cardiac events, unexplained weight loss, changes in bowel or bladder problems associated with onset of chief complaint, new onset stumbling or dropping things except for right hand R hand is stiff (denies paresthesia).    Limitations  Sitting;House hold activities;Other (comment);Walking   causes difficulty completing usual daily activities and pain seems proportional to the amount of activity she does each day. Usual activities include ADLs, IADLs, exercise at least 3x per week, spending time w/family, housework, hobbies such as hiking.   Diagnostic tests  HIp x-ray 09/2018: "IMPRESSION:There is no acute fracture or dislocation of the left hip. There ismild  degenerative joint space loss. Of note is degenerative changeof the SI joints and pubic symphysis."    Patient Stated Goals  "that it won't go any further"    Currently in Pain?  Yes    Pain Location  --   Hands and back a little bit   Pain Onset  More than a month ago         [facility] PT Assessment - 02/18/20 0001      Assessment   Medical Diagnosis  Anterior femoral cutaneous neuropathy of left lower extremity    Referring Provider (PT)  Rubie Maid, MD    Onset Date/Surgical Date  03/01/17    Next MD Visit  yearly    Prior Therapy  none for this problem prior to current episode of care      Precautions   Precautions  None      Restrictions   Weight Bearing Restrictions  No      Balance Screen   Has the patient fallen in the past 6 months  Yes    How many times?  2    Has the patient had a decrease in activity level because of a fear of falling?   Yes    Is the patient  reluctant to leave their home because of a fear of falling?   No      Home Environment   Living Environment  --   no concerns about getting around home     Prior Function   Level of Independence  Independent    Vocation  Retired    Biomedical scientist  school systom in [place]  exercise, crochet, drawing,  spending time with family      Cognition   Overall Cognitive Status  Within Functional Limits for tasks assessed      Observation/Other Assessments   Observations  --    Focus on Therapeutic Outcomes (FOTO)   99      Functional Gait  Assessment   Gait assessed   Yes    Gait Level Surface  Walks 20 ft in less than 7 sec but greater than 5.5 sec, uses assistive device, slower speed, mild gait deviations, or deviates 6-10 in outside of the 12 in walkway width.    Change in Gait Speed  Able to smoothly change walking speed without loss of balance or gait deviation. Deviate no more than 6 in outside of the 12 in walkway width.    Gait with Horizontal Head Turns  Performs head turns smoothly with slight change in gait velocity (eg, minor disruption to smooth gait path), deviates 6-10 in outside 12 in walkway width, or uses an assistive device.    Gait with Vertical Head Turns  Performs task with slight change in gait velocity (eg, minor disruption to smooth gait path), deviates 6 - 10 in outside 12 in walkway width or uses assistive device    Gait and Pivot Turn  Pivot turns safely within 3 sec and stops quickly with no loss of balance.    Step Over Obstacle  Is able to step over 2 stacked shoe boxes taped together (9 in total height) without changing gait speed. No evidence of imbalance.    Gait with Narrow Base of Support  Ambulates less than 4 steps heel to toe or cannot perform without assistance.    Gait with Eyes Closed  Walks 20 ft, slow speed, abnormal gait pattern, evidence for imbalance, deviates 10-15 in outside 12  in walkway width. Requires more than 9 sec to  ambulate 20 ft.    Ambulating Backwards  Walks 20 ft, uses assistive device, slower speed, mild gait deviations, deviates 6-10 in outside 12 in walkway width.    Steps  Alternating feet, no rail.    Total Score  21    FGA comment:  19-24 = medium risk fall        OBJECTIVE:  Static Balance: R SLS = 3 seconds (several tries) L SLS = 2 secons (several tries)   TREATMENT:  Neuromuscular Re-education: to improve, balance, postural strength, muscle activation patterns, and stabilization strength required for functional activities: - Functional Gait Assessment (see above) with more than one pass for some of the activities to better understand pt's regions of deficit.  - single leg stance assessment (see above) - tandem stance eyes open x 30 seconds each side with touch down  UE support as needed.  - standing alternating double toe taps 3x10 with CGA - min A for safety.  Standing on [employer], narrow stance:  - horizontal head turns 2x10 - vertical head nods, 2x10 - eyes closed 2x30 seconds  HOME EXERCISE PROGRAM Access Code: [email or web address] URL: [email or web address] Date: 01/28/2020 Prepared by: Rosita Kea  Exercises Standing Hip Flexion - 1-2 x daily - 2-3 sets - 10 reps - 2 seconds hold      PT Education - 02/18/20 1351    Education Details  Exercise purpose/form. Self management techniques. HEP    Person(s) Educated  Patient    Methods  Explanation;Demonstration;Tactile cues;Verbal cues    Comprehension  Verbalized understanding;Returned demonstration;Verbal cues required;Tactile cues required;Need further instruction       PT Short Term Goals - 02/18/20 1402      PT SHORT TERM GOAL #1   Title  Be independent with initial home exercise program for self-management of symptoms.    Baseline  iniital HEP provided at IE (01/28/2020);    Time  2    Period  Weeks    Status  On-going    Target Date  02/11/20        PT Long Term Goals - 01/28/20 1103      PT  LONG TERM GOAL #1   Title  Be independent with a long-term home exercise program for self-management of symptoms.     Baseline  iniital HEP provided at IE (01/28/2020);    Time  12    Period  Weeks    Status  New    Target Date  --   TARGET DATE FOR ALL LONG TERM GOALS: 04/21/2020     PT LONG TERM GOAL #2   Title  Reduce pain with functional activities to equal or less than 1/10 to allow patient to complete usual activities including ADLs, IADLs, and social engagement with less difficulty.    Baseline  up to 8/10 (01/28/2020);    Time  12    Period  Weeks    Status  New      PT LONG TERM GOAL #3   Title  Patient will demonstrate B LE strength 5/5 to demonstrate functional strength for independent gait, increased standing tolerance, lifting, carrying and increased ADL ability.    Baseline  see objective exam (01/28/2020);    Time  12    Period  Weeks    Status  New      PT LONG TERM GOAL #4   Title  Complete community, work and/or recreational activities without limitation due to current  condition.     Baseline  limiting ability to complete completing usual daily activities and pain seems proportional to the amount of activity she does each day, includes ADLs, IADLs, exercise at least 3x per week, spending time with family, keeping up her home, relaxing, and engaging in hobbies such as hiking and crochet without difficulty (01/28/2020);    Time  12    Period  Weeks    Status  New            Plan - 02/18/20 1402    Clinical Impression Statement  Patient tolerated treatment well overall with no increase in pain during session. Patient demonstrated moderate risk for falls based on score of 21/30 on Functional Gait Assessment. Had the most difficulty with tandem walking and walking backwards but also showed deficits with head movement while walking, that pt acknowledged she had noticed in her daily life. Also is unable to stand on one leg for more than 2-3 seconds at a time after multiple  tries that can make her more unsteady when attempting to balance in challenging circumstances such as stepping over large objects or times when a delay in putting down foot occurs. Initiated interventions to improve balance and reduce fall risk. Required up to min A at times to prevent falls during most challenging exercises/assessments. Patient would benefit from continued management of limiting condition by skilled physical therapist to address remaining impairments and functional limitations to work towards stated goals and return to PLOF or maximal functional independence.    Personal Factors and Comorbidities  Age;Comorbidity 3+;Sex;Time since onset of injury/illness/exacerbation;Past/Current Experience    Comorbidities  Relevant past medical history and comorbidities include diabetes with peripheral neuropathy, sleep apnea, history of uterine cancer (followed by radiologist, OBGYN, and oncologist), diverticulosis, bilateral TKA, right rotator cuff repair, Barret's Esophagus, history of chronic lumbar and left hip pain (no spinal surgery), asthma and bronchitis, hx of neck pain.    Examination-Activity Limitations  Squat;Stairs;Other   causes difficulty completing usual daily activities and pain seems proportional to the amount of activity she does each day, includes ADLs, IADLs, exercise at least 3x per week, spending time with family, keeping up her home,   Examination-Participation Restrictions  Community Activity;Yard Work;Cleaning;Shop;Laundry;Interpersonal Relationship;Other   hiking, crochet   Stability/Clinical Decision Making  Evolving/Moderate complexity    Rehab Potential  Fair    PT Frequency  2x / week    PT Duration  12 weeks   up to 12 weeks as needed   PT Treatment/Interventions  ADLs/Self Care Home Management;Biofeedback;Cryotherapy;Moist Heat;Therapeutic activities;Therapeutic exercise;Neuromuscular re-education;Patient/family education;Manual techniques;Passive range of motion;Dry  needling;Spinal Manipulations;Joint Manipulations    PT Next Visit Plan  assess response to HEP and update as appropriate. Strengthening and stretching of L thigh, neural mobilization, consider explorying repeated movements    PT Home Exercise Plan  Medbridge Access Code: [email or web address], [email or web address] hip flexor stretch    Consulted and Agree with Plan of Care  Patient       Patient will benefit from skilled therapeutic intervention in order to improve the following deficits and impairments:  Pain, Decreased activity tolerance, Decreased range of motion, Impaired perceived functional ability, Obesity, Increased edema, Decreased strength, Hypomobility, Increased muscle spasms, Decreased endurance  Visit Diagnosis: Pain in left thigh  Unspecified disturbances of skin sensation  Muscle weakness (generalized)     Problem List Patient Active Problem List   Diagnosis Date Noted  . Bronchitis 01/22/2020  . Hepatic steatosis 07/18/2019  . Precordial pain 07/03/2019  . Epigastric  pain 07/03/2019  . Vertigo, labyrinthine, unspecified laterality 03/30/2019  . Chronic hip pain, left 09/29/2018  . Pain of right tibia 09/29/2018  . Hyperlipidemia associated with type 2 diabetes mellitus ([place]) 03/29/2018  . Bilateral pes planus 03/29/2018  . SUI (stress urinary incontinence, female) 11/23/2017  . Vaginal atrophy 11/23/2017  . Obesity (BMI 30.0-34.9) 06/20/2017  . Carpal tunnel syndrome on right 06/20/2017  . Endometrioid adenocarcinoma of uterus ([place]) 01/19/2017  . OSA on CPAP 11/24/2016  . Cyst of ovary 11/16/2016  . Cervical stenosis (uterine cervix) 11/16/2016  . CKD (chronic kidney disease) stage 3, GFR 30-59 ml/min 11/07/2014  . GERD (gastroesophageal reflux disease) 12/26/2013  . Irritable bowel syndrome with constipation 12/26/2013  . Encounter for preventive health examination 10/21/2013  . Hyperlipidemia LDL goal <100 07/15/2013  . Chronic venous insufficiency 03/21/2013  . DM (diabetes  mellitus), type 2 with renal complications ([place]) [id number]  . Lack of libido 11/05/2012  . Barrett's esophagus 11/05/2012  . Hiatal hernia 11/05/2012  . Essential hypertension 11/05/2012   Everlean Alstrom. Graylon Good, PT, DPT 02/18/20, 2:04 PM  [facility] [address], [place], [address] Phone: [phone]   Fax:  [phone]  Name: ZAHAVA BREZINSKI MRN: [id number] Date of Birth: [birth year]

## 2020-02-19 DIAGNOSIS — M79641 Pain in right hand: Secondary | ICD-10-CM | POA: Diagnosis not present

## 2020-02-19 DIAGNOSIS — M25532 Pain in left wrist: Secondary | ICD-10-CM | POA: Diagnosis not present

## 2020-02-20 ENCOUNTER — Other Ambulatory Visit: Payer: Self-pay

## 2020-02-20 ENCOUNTER — Encounter: Payer: Self-pay | Admitting: Physical Therapy

## 2020-02-20 ENCOUNTER — Ambulatory Visit: Payer: Medicare PPO | Admitting: Physical Therapy

## 2020-02-20 DIAGNOSIS — M6281 Muscle weakness (generalized): Secondary | ICD-10-CM

## 2020-02-20 DIAGNOSIS — R209 Unspecified disturbances of skin sensation: Secondary | ICD-10-CM

## 2020-02-20 DIAGNOSIS — M79652 Pain in left thigh: Secondary | ICD-10-CM | POA: Diagnosis not present

## 2020-02-20 NOTE — Therapy (Signed)
[facility] [address] [place], [address] Phone: [phone]   Fax:  [phone]  Physical Therapy Treatment  Patient Details  Name: Sandra Hendrix MRN: [id number] Date of Birth: Feb 14, 1946 Referring Provider (PT): Rubie Maid, MD   Encounter Date: 02/20/2020  PT End of Session - 02/20/20 1700    Visit Number  4    Number of Visits  24   as needed   Date for PT Re-Evaluation  04/21/20    Authorization Type  [employer] Medicare reporting period from 01/28/2020    Authorization Time [place] 24 visits 01/28/20 - 04/24/20 [id number]    Authorization - Visit Number  4    Authorization - Number of Visits  24    Progress Note Due on Visit  10    PT Start Time  1655    PT Stop Time  1735    PT Time Calculation (min)  40 min    Equipment Utilized During Treatment  Gait belt    Activity Tolerance  Patient tolerated treatment well    Behavior During Therapy  WFL for tasks assessed/performed       Past Medical History:  Diagnosis Date  . Allergy   . Anxiety   . Arthritis   . Barrett's esophagus determined by endoscopy   . Cancer [facility])    Uterine Cancer  . Chronic kidney disease   . Depression   . Diabetes mellitus without complication ([place])   . Difficult intubation    "not sure what happened,asleep was told had problem with intubation"  . Diverticulosis   . Fatty liver   . GERD (gastroesophageal reflux disease)   . History of bronchitis   . History of hiatal hernia   . Hyperlipidemia   . Hypertension   . Sleep apnea    Use C- PAP  . Urinary incontinence   . Varicose veins     Past Surgical History:  Procedure Laterality Date  . CARDIAC CATHETERIZATION    . CHOLECYSTECTOMY    . COLONOSCOPY WITH PROPOFOL N/A 06/14/2016   Procedure: COLONOSCOPY WITH PROPOFOL;  Surgeon: Lollie Sails, MD;  Location: [facility] ENDOSCOPY;  Service: Endoscopy;  Laterality: N/A;  .  ESOPHAGOGASTRODUODENOSCOPY (EGD) WITH PROPOFOL N/A 06/14/2016   Procedure: ESOPHAGOGASTRODUODENOSCOPY (EGD) WITH PROPOFOL;  Surgeon: Lollie Sails, MD;  Location: [facility] ENDOSCOPY;  Service: Endoscopy;  Laterality: N/A;  . ESOPHAGOGASTRODUODENOSCOPY (EGD) WITH PROPOFOL N/A 06/12/2017   Procedure: ESOPHAGOGASTRODUODENOSCOPY (EGD) WITH PROPOFOL;  Surgeon: Lollie Sails, MD;  Location: [facility] ENDOSCOPY;  Service: Endoscopy;  Laterality: N/A;  . ESOPHAGOGASTRODUODENOSCOPY (EGD) WITH PROPOFOL N/A 08/25/2017   Procedure: ESOPHAGOGASTRODUODENOSCOPY (EGD) WITH PROPOFOL;  Surgeon: Lollie Sails, MD;  Location: [facility] ENDOSCOPY;  Service: Endoscopy;  Laterality: N/A;  . HYSTEROSCOPY WITH D & C N/A 01/02/2017   Procedure: DILATATION AND CURETTAGE /HYSTEROSCOPY;  Surgeon: Brayton Mars, MD;  Location: [facility] ORS;  Service: Gynecology;  Laterality: N/A;  . JOINT REPLACEMENT Right 2007   Total Knee Replacement  . JOINT REPLACEMENT Left 2004   Total Knee Replacement  . LAPAROSCOPIC HYSTERECTOMY Bilateral 03/01/2017   Procedure: HYSTERECTOMY TOTAL LAPAROSCOPIC BSO;  Surgeon: Mellody Drown, MD;  Location: [facility] ORS;  Service: Gynecology;  Laterality: Bilateral;  . ROTATOR CUFF REPAIR Right   . SENTINEL NODE BIOPSY N/A 03/01/2017   Procedure: SENTINEL NODE INJECTION AND BIOPSY;  Surgeon: Mellody Drown, MD;  Location: [facility] ORS;  Service: Gynecology;  Laterality: N/A;  . TONSILLECTOMY  as a child  . TUBAL LIGATION    . UPPER GI ENDOSCOPY     x2    There were no vitals filed for this visit.  Subjective Assessment - 02/20/20 1656    Subjective  Patient report her back is sore today so she took a muscle relaxer and some tylenol. She thinks she overdid it yesterday doing a walking exercise video and cleaned her porch. She also had cramps in her right toes and left calf this morning so she put some foam that she uses on her muscles and mustard. That helped but they started up again. She thinks she may  not have drank enough water yesterday. The workout was about 1 hour. She is feeling a bit stressed tonight due to "every day things" and having back pain.    Pertinent History  Patient is a 74 y.o. 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 This patient's chief complaints consist of gradually worsening intermittent anterior L thigh pain that started with her hysterectomy surgery in 03/01/2017 leading to the following functional deficits: causes difficulty completing usual daily activities and pain seems proportional to the amount of activity she does each day. Usual activities include ADLs, IADLs, exercise at least 3x per week, spending time with family, keeping up her home, and engaging in hobbies such as hiking and crochet. Relevant past medical history and comorbidities include diabetes with peripheral neuropathy, sleep apnea, history of uterine cancer (followed by radiologist, OBGYN, and oncologist), diverticulosis, bilateral TKA, right rotator cuff repair, Barret's Esophagus, history of chronic lumbar and left hip pain (no spinal surgery), asthma and bronchitis, hx of neck pain.  Patient denies hx of stroke, seizures, lung problem (aside from above), major cardiac events, unexplained weight loss, changes in bowel or bladder problems associated with onset of chief complaint, new onset stumbling or dropping things except for right hand R hand is stiff (denies paresthesia).    Limitations  Sitting;House hold activities;Other (comment);Walking   causes difficulty completing usual daily activities and pain seems proportional to the amount of activity she does each day. Usual activities include ADLs, IADLs, exercise at least 3x per week, spending time w/family, housework, hobbies such as hiking.   Diagnostic tests  HIp x-ray 09/2018: "IMPRESSION:There is no acute fracture or dislocation of the left hip. There  ismild degenerative joint space loss. Of note is degenerative changeof the SI joints and pubic symphysis."    Patient Stated Goals  "that it won't go any further"    Currently in Pain?  Yes    Pain Score  5     Pain Location  Back    Pain Orientation  Left;Lower    Pain Onset  More than a month ago       OBJECTIVE:    TREATMENT:  Neuromuscular Re-education: to improve, balance, postural strength, muscle activation patterns, and stabilization strength required for functional activities:  - standing alternating double toe taps 3x10 with CGA for safety.   Standing on Airex, narrow stance unless otherwise noted:  - horizontal head turns 2x20 - vertical head nods, 2x20 - eyes closed 2x60 seconds - semi-tandem stance, 2x60 seconds each foot front.   Hurdles, 6 inch high, CGA for safety:  - step to gait leading with each foot x 6 each - step over step 2x6 - tap step, step to pattern x 6 leading with each foot - tep step, alternating step to pattern,  2x6 - side stepping, 2x6 each direction  Therapeutic exercise: to centralize symptoms and improve ROM, strength, muscular endurance, and activity tolerance required for successful completion of functional activities.  - bent over hip extension/abduction diagonal 1x10 each side with 4 count hold.  - ambulation 3x100 feet as an active break during airex exercises when feet became tired.     HOME EXERCISE PROGRAM Access Code: [email or web address] URL: [email or web address] Date: 01/28/2020 Prepared by: Rosita Kea  Exercises Standing Hip Flexion - 1-2 x daily - 2-3 sets - 10 reps - 2 seconds hold   PT Education - 02/20/20 1659    Education Details  Exercise purpose/form. Self management techniques.    Person(s) Educated  Patient    Methods  Explanation;Demonstration;Tactile cues;Verbal cues    Comprehension  Verbalized understanding;Returned demonstration;Verbal cues required;Tactile cues required;Need further instruction        PT Short Term Goals - 02/18/20 1402      PT SHORT TERM GOAL #1   Title  Be independent with initial home exercise program for self-management of symptoms.    Baseline  iniital HEP provided at IE (01/28/2020);    Time  2    Period  Weeks    Status  On-going    Target Date  02/11/20        PT Long Term Goals - 01/28/20 1103      PT LONG TERM GOAL #1   Title  Be independent with a long-term home exercise program for self-management of symptoms.     Baseline  iniital HEP provided at IE (01/28/2020);    Time  12    Period  Weeks    Status  New    Target Date  --   TARGET DATE FOR ALL LONG TERM GOALS: 04/21/2020     PT LONG TERM GOAL #2   Title  Reduce pain with functional activities to equal or less than 1/10 to allow patient to complete usual activities including ADLs, IADLs, and social engagement with less difficulty.    Baseline  up to 8/10 (01/28/2020);    Time  12    Period  Weeks    Status  New      PT LONG TERM GOAL #3   Title  Patient will demonstrate B LE strength 5/5 to demonstrate functional strength for independent gait, increased standing tolerance, lifting, carrying and increased ADL ability.    Baseline  see objective exam (01/28/2020);    Time  12    Period  Weeks    Status  New      PT LONG TERM GOAL #4   Title  Complete community, work and/or recreational activities without limitation due to current condition.     Baseline  limiting ability to complete completing usual daily activities and pain seems proportional to the amount of activity she does each day, includes ADLs, IADLs, exercise at least 3x per week, spending time with family, keeping up her home, relaxing, and engaging in hobbies such as hiking and crochet without difficulty (01/28/2020);    Time  12    Period  Weeks    Status  New            Plan - 02/20/20 1746    Clinical Impression Statement  Patient tolerated treatment well and reported decrease in back pain by end of session. Was unable to  complete head turns on airex in tandem stance but able to increase balance time and complexity of exercises. Continues to have deficits in  balance that are especially apparent in single leg stance, with moving head, narrow base of support, and eyes closed. Initiated hip extension/abduction exercises to improve hip strength to support SLS and balance. Does demonstrate compensatory contralateral lean when lifting leg. Patient would benefit from continued management of limiting condition by skilled physical therapist to address remaining impairments and functional limitations to work towards stated goals and return to PLOF or maximal functional independence.    Personal Factors and Comorbidities  Age;Comorbidity 3+;Sex;Time since onset of injury/illness/exacerbation;Past/Current Experience    Comorbidities  Relevant past medical history and comorbidities include diabetes with peripheral neuropathy, sleep apnea, history of uterine cancer (followed by radiologist, OBGYN, and oncologist), diverticulosis, bilateral TKA, right rotator cuff repair, Barret's Esophagus, history of chronic lumbar and left hip pain (no spinal surgery), asthma and bronchitis, hx of neck pain.    Examination-Activity Limitations  Squat;Stairs;Other   causes difficulty completing usual daily activities and pain seems proportional to the amount of activity she does each day, includes ADLs, IADLs, exercise at least 3x per week, spending time with family, keeping up her home,   Examination-Participation Restrictions  Community Activity;Yard Work;Cleaning;Shop;Laundry;Interpersonal Relationship;Other   hiking, crochet   Stability/Clinical Decision Making  Evolving/Moderate complexity    Rehab Potential  Fair    PT Frequency  2x / week    PT Duration  12 weeks   up to 12 weeks as needed   PT Treatment/Interventions  ADLs/Self Care Home Management;Biofeedback;Cryotherapy;Moist Heat;Therapeutic activities;Therapeutic exercise;Neuromuscular  re-education;Patient/family education;Manual techniques;Passive range of motion;Dry needling;Spinal Manipulations;Joint Manipulations    PT Next Visit Plan  assess response to HEP and update as appropriate. Strengthening and stretching of L thigh, neural mobilization, consider explorying repeated movements    PT Home Exercise Plan  Medbridge Access Code: [email or web address], [email or web address] hip flexor stretch    Consulted and Agree with Plan of Care  Patient       Patient will benefit from skilled therapeutic intervention in order to improve the following deficits and impairments:  Pain, Decreased activity tolerance, Decreased range of motion, Impaired perceived functional ability, Obesity, Increased edema, Decreased strength, Hypomobility, Increased muscle spasms, Decreased endurance  Visit Diagnosis: Pain in left thigh  Unspecified disturbances of skin sensation  Muscle weakness (generalized)     Problem List Patient Active Problem List   Diagnosis Date Noted  . Bronchitis 01/22/2020  . Hepatic steatosis 07/18/2019  . Precordial pain 07/03/2019  . Epigastric pain 07/03/2019  . Vertigo, labyrinthine, unspecified laterality 03/30/2019  . Chronic hip pain, left 09/29/2018  . Pain of right tibia 09/29/2018  . Hyperlipidemia associated with type 2 diabetes mellitus ([place]) 03/29/2018  . Bilateral pes planus 03/29/2018  . SUI (stress urinary incontinence, female) 11/23/2017  . Vaginal atrophy 11/23/2017  . Obesity (BMI 30.0-34.9) 06/20/2017  . Carpal tunnel syndrome on right 06/20/2017  . Endometrioid adenocarcinoma of uterus ([place]) 01/19/2017  . OSA on CPAP 11/24/2016  . Cyst of ovary 11/16/2016  . Cervical stenosis (uterine cervix) 11/16/2016  . CKD (chronic kidney disease) stage 3, GFR 30-59 ml/min 11/07/2014  . GERD (gastroesophageal reflux disease) 12/26/2013  . Irritable bowel syndrome with constipation 12/26/2013  . Encounter for preventive health examination 10/21/2013  . Hyperlipidemia LDL  goal <100 07/15/2013  . Chronic venous insufficiency 03/21/2013  . DM (diabetes mellitus), type 2 with renal complications ([place]) [id number]  . Lack of libido 11/05/2012  . Barrett's esophagus 11/05/2012  . Hiatal hernia 11/05/2012  . Essential hypertension 11/05/2012    Everlean Alstrom. Graylon Good, PT, DPT 02/20/20, 5:48  PM  [facility] [address] [place], [address] Phone: [phone]   Fax:  [phone]  Name: TAMKIA GALLICK MRN: [id number] Date of Birth: [birth year]

## 2020-02-24 ENCOUNTER — Encounter: Payer: Self-pay | Admitting: Internal Medicine

## 2020-02-24 DIAGNOSIS — S63502S Unspecified sprain of left wrist, sequela: Secondary | ICD-10-CM | POA: Insufficient documentation

## 2020-02-24 DIAGNOSIS — M79641 Pain in right hand: Secondary | ICD-10-CM | POA: Insufficient documentation

## 2020-02-25 ENCOUNTER — Other Ambulatory Visit: Payer: Self-pay

## 2020-02-25 ENCOUNTER — Ambulatory Visit: Payer: Medicare PPO | Attending: Obstetrics and Gynecology | Admitting: Physical Therapy

## 2020-02-25 ENCOUNTER — Ambulatory Visit: Payer: Medicare PPO | Admitting: Obstetrics and Gynecology

## 2020-02-25 ENCOUNTER — Encounter: Payer: Self-pay | Admitting: Physical Therapy

## 2020-02-25 ENCOUNTER — Encounter: Payer: Self-pay | Admitting: Obstetrics and Gynecology

## 2020-02-25 VITALS — BP 112/68 | HR 82 | Ht 61.0 in | Wt 180.1 lb

## 2020-02-25 DIAGNOSIS — M6281 Muscle weakness (generalized): Secondary | ICD-10-CM | POA: Diagnosis not present

## 2020-02-25 DIAGNOSIS — N907 Vulvar cyst: Secondary | ICD-10-CM | POA: Diagnosis not present

## 2020-02-25 DIAGNOSIS — M79652 Pain in left thigh: Secondary | ICD-10-CM | POA: Diagnosis not present

## 2020-02-25 DIAGNOSIS — R209 Unspecified disturbances of skin sensation: Secondary | ICD-10-CM | POA: Diagnosis not present

## 2020-02-25 MED ORDER — ATORVASTATIN CALCIUM 40 MG PO TABS: 20.0000 mg | ORAL_TABLET | Freq: Every day | ORAL | 3 refills | Status: DC

## 2020-02-25 NOTE — Patient Instructions (Signed)
Epidermal Cyst  An epidermal cyst is a small, painless lump under your skin. The cyst contains a grayish-white, bad-smelling substance (keratin). Do not try to pop or open an epidermal cyst yourself. What are the causes?  A blocked hair follicle.  A hair that curls and re-enters the skin instead of growing straight out of the skin.  A blocked pore.  Irritated skin.  An injury to the skin.  Certain conditions that are passed along from parent to child (inherited).  Human papillomavirus (HPV).  Long-term sun damage to the skin. What increases the risk?  Having acne.  Being overweight.  Being 30-40 years old. What are the signs or symptoms? These cysts are usually harmless, but they can get infected. Symptoms of infection may include:  Redness.  Inflammation.  Tenderness.  Warmth.  Fever.  A grayish-white, bad-smelling substance drains from the cyst.  Pus drains from the cyst. How is this treated? In many cases, epidermal cysts go away on their own without treatment. If a cyst becomes infected, treatment may include:  Opening and draining the cyst, done by a doctor. After draining, you may need minor surgery to remove the rest of the cyst.  Antibiotic medicine.  Shots of medicines (steroids) that help to reduce inflammation.  Surgery to remove the cyst. Surgery may be done if the cyst: ? Becomes large. ? Bothers you. ? Has a chance of turning into cancer.  Do not try to open a cyst yourself. Follow these instructions at home:  Take over-the-counter and prescription medicines only as told by your doctor.  If you were prescribed an antibiotic medicine, take it it as told by your doctor. Do not stop using the antibiotic even if you start to feel better.  Keep the area around your cyst clean and dry.  Wear loose, dry clothing.  Avoid touching your cyst.  Check your cyst every day for signs of infection. Check for: ? Redness, swelling, or pain. ? Fluid  or blood. ? Warmth. ? Pus or a bad smell.  Keep all follow-up visits as told by your doctor. This is important. How is this prevented?  Wear clean, dry, clothing.  Avoid wearing tight clothing.  Keep your skin clean and dry. Take showers or baths every day. Contact a doctor if:  Your cyst has symptoms of infection.  Your condition does not improve or gets worse.  You have a cyst that looks different from other cysts you have had.  You have a fever. Get help right away if:  Redness spreads from the cyst into the area close by. Summary  An epidermal cyst is a sac made of skin tissue.  If a cyst becomes infected, treatment may include surgery to open and drain the cyst, or to remove it.  Take over-the-counter and prescription medicines only as told by your doctor.  Contact a doctor if your condition is not improving or is getting worse.  Keep all follow-up visits as told by your doctor. This is important. This information is not intended to replace advice given to you by your health care provider. Make sure you discuss any questions you have with your health care provider. Document Revised: 01/03/2019 Document Reviewed: 06/21/2018 Elsevier Patient Education  2020 Elsevier Inc.  

## 2020-02-25 NOTE — Progress Notes (Signed)
    GYNECOLOGY PROGRESS NOTE  Subjective:    Patient ID: Taven Woodis Krise, female    DOB: 05-23-1946, 74 y.o.   MRN: [id number]  HPI  Patient is a 74 y.o. G10P2002 female who presents for complaints of bump on right labia, has been present for ~ 1 month.  Notes that another has appeared on the left labia, has been there for almost 1 week.  Bumps are not painful, denies any drainage, redness. Has not tried to treat the areas.    The following portions of the patient's history were reviewed and updated as appropriate: allergies, current medications, past family history, past medical history, past social history, past surgical history and problem list.  Review of Systems Pertinent items noted in HPI and remainder of comprehensive ROS otherwise negative.   Objective:   Blood pressure 112/68, pulse 82, height 5\' 1"  (1.549 m), weight 180 lb 1.6 oz (81.7 kg). General appearance: alert and no distress Pelvic: external genitalia visibly normal.  Small 2 mm nodule palpable on left labia majora beneath the skin. Small 3 mm nodule palpable beneath the skin of right labia majora. Neither nodule tender. Internal exam not performed.    Assessment:   Inclusion cyst  Plan:   Given reassurance of benign findings. Discussed home measures, advised that cyst would like resolve with time on their own.  Return to clinic for any scheduled appointments or for any gynecologic concerns as needed.    Rubie Maid, MD [facility]

## 2020-02-25 NOTE — Progress Notes (Signed)
Pt is present due to lump on labia area. Pt stated that she noticed the lump on both labia x 1 month. Pt denies any pain from the lumps.

## 2020-02-25 NOTE — Therapy (Signed)
[facility] [address], [place], [address] Phone: [phone]   Fax:  [phone]  Physical Therapy Treatment  Patient Details  Name: Sandra Hendrix MRN: [id number] Date of Birth: 12-01-1945 Referring Provider (PT): Rubie Maid, MD   Encounter Date: 02/25/2020  PT End of Session - 02/25/20 1835    Visit Number  5    Number of Visits  24   as needed   Date for PT Re-Evaluation  04/21/20    Authorization Type  [employer] reporting period from 01/28/2020    Authorization Time [place] 24 visits 01/28/20 - 04/24/20 [id number]    Authorization - Visit Number  5    Authorization - Number of Visits  24    Progress Note Due on Visit  10    PT Start Time  1738    PT Stop Time  1816    PT Time Calculation (min)  38 min    Equipment Utilized During Treatment  Gait belt    Activity Tolerance  Patient tolerated treatment well;No increased pain    Behavior During Therapy  WFL for tasks assessed/performed       Past Medical History:  Diagnosis Date  . Allergy   . Anxiety   . Arthritis   . Barrett's esophagus determined by endoscopy   . Cancer [facility])    Uterine Cancer  . Chronic kidney disease   . Depression   . Diabetes mellitus without complication ([place])   . Difficult intubation    "not sure what happened,asleep was told had problem with intubation"  . Diverticulosis   . Fatty liver   . GERD (gastroesophageal reflux disease)   . History of bronchitis   . History of hiatal hernia   . Hyperlipidemia   . Hypertension   . Sleep apnea    Use C- PAP  . Urinary incontinence   . Varicose veins     Past Surgical History:  Procedure Laterality Date  . CARDIAC CATHETERIZATION    . CHOLECYSTECTOMY    . COLONOSCOPY WITH PROPOFOL N/A 06/14/2016   Procedure: COLONOSCOPY WITH PROPOFOL;  Surgeon: Lollie Sails, MD;  Location: [facility] ENDOSCOPY;  Service: Endoscopy;  Laterality: N/A;  .  ESOPHAGOGASTRODUODENOSCOPY (EGD) WITH PROPOFOL N/A 06/14/2016   Procedure: ESOPHAGOGASTRODUODENOSCOPY (EGD) WITH PROPOFOL;  Surgeon: Lollie Sails, MD;  Location: [facility] ENDOSCOPY;  Service: Endoscopy;  Laterality: N/A;  . ESOPHAGOGASTRODUODENOSCOPY (EGD) WITH PROPOFOL N/A 06/12/2017   Procedure: ESOPHAGOGASTRODUODENOSCOPY (EGD) WITH PROPOFOL;  Surgeon: Lollie Sails, MD;  Location: [facility] ENDOSCOPY;  Service: Endoscopy;  Laterality: N/A;  . ESOPHAGOGASTRODUODENOSCOPY (EGD) WITH PROPOFOL N/A 08/25/2017   Procedure: ESOPHAGOGASTRODUODENOSCOPY (EGD) WITH PROPOFOL;  Surgeon: Lollie Sails, MD;  Location: [facility] ENDOSCOPY;  Service: Endoscopy;  Laterality: N/A;  . HYSTEROSCOPY WITH D & C N/A 01/02/2017   Procedure: DILATATION AND CURETTAGE /HYSTEROSCOPY;  Surgeon: Brayton Mars, MD;  Location: [facility] ORS;  Service: Gynecology;  Laterality: N/A;  . JOINT REPLACEMENT Right 2007   Total Knee Replacement  . JOINT REPLACEMENT Left 2004   Total Knee Replacement  . LAPAROSCOPIC HYSTERECTOMY Bilateral 03/01/2017   Procedure: HYSTERECTOMY TOTAL LAPAROSCOPIC BSO;  Surgeon: Mellody Drown, MD;  Location: [facility] ORS;  Service: Gynecology;  Laterality: Bilateral;  . ROTATOR CUFF REPAIR Right   . SENTINEL NODE BIOPSY N/A 03/01/2017   Procedure: SENTINEL NODE INJECTION AND BIOPSY;  Surgeon: Mellody Drown, MD;  Location: [facility] ORS;  Service: Gynecology;  Laterality: N/A;  .  TONSILLECTOMY     as a child  . TUBAL LIGATION    . UPPER GI ENDOSCOPY     x2    There were no vitals filed for this visit.  Subjective Assessment - 02/25/20 1744    Subjective  Pateint reports her left thigh pain returned for a little while when she was sitting one day after a busy day. She has been doing hip flexion exercises but not extension because her knee is still healing from the fall she had during vacation. She has been doing some balance exercises at home as well.    Pertinent History  Patient is a 74 y.o. 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 This patient's chief complaints consist of gradually worsening intermittent anterior L thigh pain that started with her hysterectomy surgery in 03/01/2017 leading to the following functional deficits: causes difficulty completing usual daily activities and pain seems proportional to the amount of activity she does each day. Usual activities include ADLs, IADLs, exercise at least 3x per week, spending time with family, keeping up her home, and engaging in hobbies such as hiking and crochet. Relevant past medical history and comorbidities include diabetes with peripheral neuropathy, sleep apnea, history of uterine cancer (followed by radiologist, OBGYN, and oncologist), diverticulosis, bilateral TKA, right rotator cuff repair, Barret's Esophagus, history of chronic lumbar and left hip pain (no spinal surgery), asthma and bronchitis, hx of neck pain.  Patient denies hx of stroke, seizures, lung problem (aside from above), major cardiac events, unexplained weight loss, changes in bowel or bladder problems associated with onset of chief complaint, new onset stumbling or dropping things except for right hand R hand is stiff (denies paresthesia).    Limitations  Sitting;House hold activities;Other (comment);Walking   causes difficulty completing usual daily activities and pain seems proportional to the amount of activity she does each day. Usual activities include ADLs, IADLs, exercise at least 3x per week, spending time w/family, housework, hobbies such as hiking.   Diagnostic tests  HIp x-ray 09/2018: "IMPRESSION:There is no acute fracture or dislocation of the left hip. There ismild degenerative joint space loss. Of note is degenerative changeof the SI joints and pubic symphysis."    Patient Stated Goals  "that it won't go any further"    Currently in Pain?  No/denies    Pain  Onset  More than a month ago       OBJECTIVE:   TREATMENT:  Neuromuscular Re-education:to improve, balance, postural strength, muscle activation patterns, and stabilization strength required for functional activities:  Standing on Airex, narrow stance unless otherwise noted:  - horizontal head turns 2x20 - vertical head nods, 2x20 - eyes closed 2x60 seconds - semi-tandem stance, 2x60 seconds each foot front.   - standing alternating toe double taps on cones x 10 each side with CGA to min A to prevent falls.  - tandem walking on aeromat x 5 feet x 10 with CGA - min A for safety.   Therapeutic exercise:to centralize symptoms and improve ROM, strength, muscular endurance, and activity tolerance required for successful completion of functional activities.  - bent over hip extension/abduction diagonal 2x10 each side with 4 count hold. Trial of same exercise with yellow theraband loop around ankles x 4 each side. Added to HEP.  - ambulation 2x100 feet as an active break during airex exercises when feet became tired.     HOME EXERCISE PROGRAM Access Code:  [email or web address] URL: [email or web address] Date: 01/28/2020 Prepared by: Rosita Kea  Exercises Standing Hip Flexion - 1-2 x daily - 2-3 sets - 10 reps - 2 seconds hold Diagonal Hip Extension with Resistance - 3-4 x weekly - 2-3 sets - 10 reps - 4 seconds hold  PT Education - 02/25/20 1832    Education Details  Exercise purpose/form. Self management techniques    Person(s) Educated  Patient    Methods  Explanation;Tactile cues;Demonstration;Verbal cues;Handout    Comprehension  Verbalized understanding;Returned demonstration;Verbal cues required;Tactile cues required;Need further instruction       PT Short Term Goals - 02/18/20 1402      PT SHORT TERM GOAL #1   Title  Be independent with initial home exercise program for self-management of symptoms.    Baseline  iniital HEP provided at IE (01/28/2020);    Time   2    Period  Weeks    Status  On-going    Target Date  02/11/20        PT Long Term Goals - 01/28/20 1103      PT LONG TERM GOAL #1   Title  Be independent with a long-term home exercise program for self-management of symptoms.     Baseline  iniital HEP provided at IE (01/28/2020);    Time  12    Period  Weeks    Status  New    Target Date  --   TARGET DATE FOR ALL LONG TERM GOALS: 04/21/2020     PT LONG TERM GOAL #2   Title  Reduce pain with functional activities to equal or less than 1/10 to allow patient to complete usual activities including ADLs, IADLs, and social engagement with less difficulty.    Baseline  up to 8/10 (01/28/2020);    Time  12    Period  Weeks    Status  New      PT LONG TERM GOAL #3   Title  Patient will demonstrate B LE strength 5/5 to demonstrate functional strength for independent gait, increased standing tolerance, lifting, carrying and increased ADL ability.    Baseline  see objective exam (01/28/2020);    Time  12    Period  Weeks    Status  New      PT LONG TERM GOAL #4   Title  Complete community, work and/or recreational activities without limitation due to current condition.     Baseline  limiting ability to complete completing usual daily activities and pain seems proportional to the amount of activity she does each day, includes ADLs, IADLs, exercise at least 3x per week, spending time with family, keeping up her home, relaxing, and engaging in hobbies such as hiking and crochet without difficulty (01/28/2020);    Time  12    Period  Weeks    Status  New            Plan - 02/25/20 1837    Clinical Impression Statement  Patient tolerated treatment well overall and was able to progress hip exercises. Continues to demonstrate deficits in balance while performing balance exercises but had less need for support. Patient would benefit from continued management of limiting condition by skilled physical therapist to address remaining impairments and  functional limitations to work towards stated goals and return to PLOF or maximal functional independence.    Personal Factors and Comorbidities  Age;Comorbidity 3+;Sex;Time since onset of injury/illness/exacerbation;Past/Current Experience    Comorbidities  Relevant past medical history and comorbidities include diabetes with  peripheral neuropathy, sleep apnea, history of uterine cancer (followed by radiologist, OBGYN, and oncologist), diverticulosis, bilateral TKA, right rotator cuff repair, Barret's Esophagus, history of chronic lumbar and left hip pain (no spinal surgery), asthma and bronchitis, hx of neck pain.    Examination-Activity Limitations  Squat;Stairs;Other   causes difficulty completing usual daily activities and pain seems proportional to the amount of activity she does each day, includes ADLs, IADLs, exercise at least 3x per week, spending time with family, keeping up her home,   Examination-Participation Restrictions  Community Activity;Yard Work;Cleaning;Shop;Laundry;Interpersonal Relationship;Other   hiking, crochet   Stability/Clinical Decision Making  Evolving/Moderate complexity    Rehab Potential  Fair    PT Frequency  2x / week    PT Duration  12 weeks   up to 12 weeks as needed   PT Treatment/Interventions  ADLs/Self Care Home Management;Biofeedback;Cryotherapy;Moist Heat;Therapeutic activities;Therapeutic exercise;Neuromuscular re-education;Patient/family education;Manual techniques;Passive range of motion;Dry needling;Spinal Manipulations;Joint Manipulations    PT Next Visit Plan  assess response to HEP and update as appropriate. Strengthening and stretching of L thigh, neural mobilization, consider explorying repeated movements    PT Home Exercise Plan  Medbridge Access Code: [email or web address], [email or web address] hip flexor stretch    Consulted and Agree with Plan of Care  Patient       Patient will benefit from skilled therapeutic intervention in order to improve the following deficits  and impairments:  Pain, Decreased activity tolerance, Decreased range of motion, Impaired perceived functional ability, Obesity, Increased edema, Decreased strength, Hypomobility, Increased muscle spasms, Decreased endurance  Visit Diagnosis: Pain in left thigh  Unspecified disturbances of skin sensation  Muscle weakness (generalized)     Problem List Patient Active Problem List   Diagnosis Date Noted  . Left wrist sprain, sequela 02/24/2020  . Right hand pain 02/24/2020  . Bronchitis 01/22/2020  . Hepatic steatosis 07/18/2019  . Precordial pain 07/03/2019  . Epigastric pain 07/03/2019  . Vertigo, labyrinthine, unspecified laterality 03/30/2019  . Chronic hip pain, left 09/29/2018  . Pain of right tibia 09/29/2018  . Hyperlipidemia associated with type 2 diabetes mellitus ([place]) 03/29/2018  . Bilateral pes planus 03/29/2018  . SUI (stress urinary incontinence, female) 11/23/2017  . Vaginal atrophy 11/23/2017  . Obesity (BMI 30.0-34.9) 06/20/2017  . Carpal tunnel syndrome on right 06/20/2017  . Endometrioid adenocarcinoma of uterus ([place]) 01/19/2017  . OSA on CPAP 11/24/2016  . Cyst of ovary 11/16/2016  . Cervical stenosis (uterine cervix) 11/16/2016  . CKD (chronic kidney disease) stage 3, GFR 30-59 ml/min 11/07/2014  . GERD (gastroesophageal reflux disease) 12/26/2013  . Irritable bowel syndrome with constipation 12/26/2013  . Encounter for preventive health examination 10/21/2013  . Hyperlipidemia LDL goal <100 07/15/2013  . Chronic venous insufficiency 03/21/2013  . DM (diabetes mellitus), type 2 with renal complications ([place]) [id number]  . Lack of libido 11/05/2012  . Barrett's esophagus 11/05/2012  . Hiatal hernia 11/05/2012  . Essential hypertension 11/05/2012    Everlean Alstrom. Graylon Good, PT, DPT 02/25/20, 6:55 PM  [facility] [address] [place], [address] Phone: [phone]   Fax:   [phone]  Name: Sandra Hendrix MRN: [id number] Date of Birth: [birth year]

## 2020-02-27 ENCOUNTER — Ambulatory Visit: Payer: Medicare PPO | Admitting: Physical Therapy

## 2020-03-02 ENCOUNTER — Other Ambulatory Visit: Payer: Self-pay

## 2020-03-02 ENCOUNTER — Ambulatory Visit: Payer: Medicare PPO | Admitting: Urology

## 2020-03-02 ENCOUNTER — Encounter: Payer: Self-pay | Admitting: Urology

## 2020-03-02 VITALS — BP 147/76 | HR 76

## 2020-03-02 DIAGNOSIS — N3946 Mixed incontinence: Secondary | ICD-10-CM

## 2020-03-02 MED ORDER — TROSPIUM CHLORIDE 20 MG PO TABS: 20.0000 mg | ORAL_TABLET | Freq: Two times a day (BID) | ORAL | 11 refills | Status: DC

## 2020-03-02 NOTE — Progress Notes (Signed)
03/02/2020 1:46 PM   Sandra Hendrix April 15, 1946 [id number]  Referring provider: Crecencio Mc, MD [facility],  [place] [address]  Chief Complaint  Patient presents with  . Follow-up    HPI: I was consulted to assess the patient's urinary incontinence worsening over many months. She leaks with coughing sneezing and with bending and lifting of its heavy. She can leak with walking  She has urge incontinence especially if she holds it long enough.This is her most bothersome symptom in the morning. No bedwetting. 2 pads a day moderately wet to soaked  Voids every 2-3 hours with no nocturia.   Grade 2 hypermobility the bladder neck and negative cough test.   During urodynamics she emptied well after uroflow. Maximum bladder capacity 504 mL. Low pressure bladder overactivity felt as urgency but did not leak. She said she likely would have been leaking as she was standing. No stress incontinence with Valsalva pressure of 124 cm of water. During voluntary voiding she voided 465 mL with a maximum of 12 mils per second. Maximum voiding pressure 12 cm water. Residual 30 mL. EMG activity increased intermittently throughout voiding bladder neck descended 2 to 3 cm. She had a depressed uroflow pattern. She also voided with a interrupted pattern.   Patient is mixed incontinence. She has very mild stress incontinence. I will treat her with medical behavioral therapy. If she does not reach her treatment goal I will have to reestablish her goals regarding stress incontinence. A sling or urethral injectable are reasonable as it is a third line OAB therapy  Reassess of Myrbetriq 50 mg samples and a prescription in 6 weeks  Today Patient was a partial responder to Myrbetriq so was given oxybutynin and Vesicare Frequency stable Incontinence 80% better.  She said her flow was okay but at the end she got to lean forward and strain to try to empty and  would double void a modest amount.  Was a bit bothersome.  She failed Vesicare.  Clinically not infected   PMH: Past Medical History:  Diagnosis Date  . Allergy   . Anxiety   . Arthritis   . Barrett's esophagus determined by endoscopy   . Cancer [facility])    Uterine Cancer  . Chronic kidney disease   . Depression   . Diabetes mellitus without complication ([place])   . Difficult intubation    "not sure what happened,asleep was told had problem with intubation"  . Diverticulosis   . Fatty liver   . GERD (gastroesophageal reflux disease)   . History of bronchitis   . History of hiatal hernia   . Hyperlipidemia   . Hypertension   . Sleep apnea    Use C- PAP  . Urinary incontinence   . Varicose veins     Surgical History: Past Surgical History:  Procedure Laterality Date  . CARDIAC CATHETERIZATION    . CHOLECYSTECTOMY    . COLONOSCOPY WITH PROPOFOL N/A 06/14/2016   Procedure: COLONOSCOPY WITH PROPOFOL;  Surgeon: Lollie Sails, MD;  Location: [facility] ENDOSCOPY;  Service: Endoscopy;  Laterality: N/A;  . ESOPHAGOGASTRODUODENOSCOPY (EGD) WITH PROPOFOL N/A 06/14/2016   Procedure: ESOPHAGOGASTRODUODENOSCOPY (EGD) WITH PROPOFOL;  Surgeon: Lollie Sails, MD;  Location: [facility] ENDOSCOPY;  Service: Endoscopy;  Laterality: N/A;  . ESOPHAGOGASTRODUODENOSCOPY (EGD) WITH PROPOFOL N/A 06/12/2017   Procedure: ESOPHAGOGASTRODUODENOSCOPY (EGD) WITH PROPOFOL;  Surgeon: Lollie Sails, MD;  Location: [facility] ENDOSCOPY;  Service: Endoscopy;  Laterality: N/A;  . ESOPHAGOGASTRODUODENOSCOPY (EGD) WITH PROPOFOL N/A  08/25/2017   Procedure: ESOPHAGOGASTRODUODENOSCOPY (EGD) WITH PROPOFOL;  Surgeon: Lollie Sails, MD;  Location: [facility] ENDOSCOPY;  Service: Endoscopy;  Laterality: N/A;  . HYSTEROSCOPY WITH D & C N/A 01/02/2017   Procedure: DILATATION AND CURETTAGE /HYSTEROSCOPY;  Surgeon: Brayton Mars, MD;  Location: [facility] ORS;  Service: Gynecology;  Laterality: N/A;  . JOINT REPLACEMENT Right 2007    Total Knee Replacement  . JOINT REPLACEMENT Left 2004   Total Knee Replacement  . LAPAROSCOPIC HYSTERECTOMY Bilateral 03/01/2017   Procedure: HYSTERECTOMY TOTAL LAPAROSCOPIC BSO;  Surgeon: Mellody Drown, MD;  Location: [facility] ORS;  Service: Gynecology;  Laterality: Bilateral;  . ROTATOR CUFF REPAIR Right   . SENTINEL NODE BIOPSY N/A 03/01/2017   Procedure: SENTINEL NODE INJECTION AND BIOPSY;  Surgeon: Mellody Drown, MD;  Location: [facility] ORS;  Service: Gynecology;  Laterality: N/A;  . TONSILLECTOMY     as a child  . TUBAL LIGATION    . UPPER GI ENDOSCOPY     x2    Home Medications:  Allergies as of 03/02/2020      Reactions   Clindamycin/lincomycin Rash   Codeine Rash   Lincomycin Rash   Morphine And Related Nausea Only   Talwin [pentazocine] Nausea And Vomiting   Lisinopril Cough   cough   Naloxone Other (See Comments)   Had issues with sedation and vomiting with EGD   Oxycodone-acetaminophen Other (See Comments)   Pt states this medication makes her bowels stop moving      Medication List       Accurate as of March 02, 2020  1:46 PM. If you have any questions, ask your nurse or doctor.        Accu-Chek Aviva Plus test strip Generic drug: glucose blood TEST TWO TIMES DAILY   accu-chek softclix lancets Use to test blood sugars 1 -2 times daily   acetaminophen 500 MG tablet Commonly known as: TYLENOL Take 2 tablets (1,000 mg total) by mouth every 6 (six) hours. What changed:   when to take this  reasons to take this   albuterol 108 (90 Base) MCG/ACT inhaler Commonly known as: VENTOLIN HFA Inhale 2 puffs into the lungs every 6 (six) hours as needed for wheezing or shortness of breath.   amLODipine 5 MG tablet Commonly known as: NORVASC Take by mouth.   Ascorbic Acid 500 MG Chew Chew by mouth.   aspirin EC 81 MG tablet Take 81 mg by mouth every evening.   atorvastatin 40 MG tablet Commonly known as: LIPITOR Take 0.5 tablets (20 mg total) by mouth daily at  6 PM.   azelastine 0.1 % nasal spray Commonly known as: ASTELIN Place 1 spray into both nostrils daily.   benzonatate 200 MG capsule Commonly known as: TESSALON   Biotin 5000 MCG Tabs Take by mouth 2 (two) times daily.   blood glucose meter kit and supplies Use to check blood sugar twice daily. ICD 10: E11.29   Cetirizine HCl 10 MG Caps Take by mouth.   Cinnamon Bark Powd Take by mouth.   COLLAGEN PO Take by mouth 2 (two) times daily. For improved fingernail strength   docusate sodium 100 MG capsule Commonly known as: Colace Take 1 capsule (100 mg total) by mouth 2 (two) times daily.   fish oil-omega-3 fatty acids 1000 MG capsule Take 1 g by mouth every evening.   fluticasone 50 MCG/ACT nasal spray Commonly known as: FLONASE Place 1 spray into both nostrils daily.   furosemide 20 MG tablet Commonly  known as: LASIX Take 1 tablet (20 mg total) by mouth every other day.   glipiZIDE 5 MG 24 hr tablet Commonly known as: GLUCOTROL XL Take 1 tablet (5 mg total) by mouth daily.   Januvia 100 MG tablet Generic drug: sitaGLIPtin TAKE 1 TABLET BY MOUTH  DAILY   Levemir 100 UNIT/ML injection Generic drug: insulin detemir INJECT 20 UNITS SUBCUTANEOUSLY AT BEDTIME   loratadine 10 MG tablet Commonly known as: CLARITIN Take 10 mg by mouth daily.   losartan-hydrochlorothiazide 50-12.5 MG tablet Commonly known as: HYZAAR TAKE 1 TABLET BY MOUTH  DAILY   Magnesium 250 MG Tabs Take 1 tablet by mouth daily.   meclizine 25 MG tablet Commonly known as: ANTIVERT Take 1 tablet (25 mg total) by mouth 3 (three) times daily as needed for dizziness.   metFORMIN 500 MG tablet Commonly known as: GLUCOPHAGE Take 1 tablet (500 mg total) by mouth 2 (two) times daily with a meal.   multivitamin tablet Take 1 tablet by mouth daily.   nitroGLYCERIN 0.4 MG SL tablet Commonly known as: NITROSTAT Place 1 tablet (0.4 mg total) under the tongue every 5 (five) minutes as needed for  chest pain.   NON FORMULARY medroxy progesterone one 37m pill once daily   oxybutynin 10 MG 24 hr tablet Commonly known as: DITROPAN-XL Take 1 tablet (10 mg total) by mouth daily.   pantoprazole 40 MG tablet Commonly known as: PROTONIX Take 1 tablet by mouth twice daily   potassium chloride SA 20 MEQ tablet Commonly known as: KLOR-CON Take 1 tablet (20 mEq total) by mouth every other day.   Probiotic-10 Caps Take 1 capsule by mouth daily.   sertraline 50 MG tablet Commonly known as: ZOLOFT Take 1 tablet (50 mg total) by mouth daily.   tiZANidine 4 MG tablet Commonly known as: ZANAFLEX tizanidine 4 mg tablet   verapamil 180 MG 24 hr capsule Commonly known as: VERELAN PM Take 1 capsule (180 mg total) by mouth daily.   vitamin B-12 1000 MCG tablet Commonly known as: CYANOCOBALAMIN Take 1,000 mcg by mouth daily.   Zinc 50 MG Tabs Take by mouth daily.       Allergies:  Allergies  Allergen Reactions  . Clindamycin/Lincomycin Rash  . Codeine Rash  . Lincomycin Rash  . Morphine And Related Nausea Only  . Talwin [Pentazocine] Nausea And Vomiting  . Lisinopril Cough    cough  . Naloxone Other (See Comments)    Had issues with sedation and vomiting with EGD  . Oxycodone-Acetaminophen Other (See Comments)    Pt states this medication makes her bowels stop moving    Family History: Family History  Problem Relation Age of Onset  . Stroke Mother   . Hypertension Mother   . Lupus Mother   . Alcohol abuse Father   . Diabetes Father   . Cancer Father        gallbladder ca into liver   . Arthritis Sister   . Diabetes Sister   . Lumbar disc disease Sister   . Hyperlipidemia Daughter   . Hypertension Daughter   . Cancer Maternal Aunt        ovarian ca  . Diabetes Paternal Aunt   . Drug abuse Paternal Aunt   . Cancer Maternal Grandfather        colon ca  . Hypertension Brother   . Alcohol abuse Brother   . Stroke Brother     Social History:  reports that  she has never  smoked. She has never used smokeless tobacco. She reports previous alcohol use. She reports that she does not use drugs.  ROS:                                        Physical Exam: There were no vitals taken for this visit.   Laboratory Data: Lab Results  Component Value Date   WBC 4.2 05/15/2017   HGB 12.0 05/15/2017   HCT 34.5 (L) 05/15/2017   MCV 88.0 05/15/2017   PLT 316 05/15/2017    Lab Results  Component Value Date   CREATININE 1.10 01/14/2020    No results found for: PSA  No results found for: TESTOSTERONE  Lab Results  Component Value Date   HGBA1C 7.1 (H) 01/14/2020    Urinalysis    Component Value Date/Time   COLORURINE YELLOW 10/05/2016 1003   APPEARANCEUR Clear 09/30/2019 1257   LABSPEC 1.010 10/05/2016 1003   PHURINE 6.0 10/05/2016 1003   GLUCOSEU Negative 09/30/2019 1257   GLUCOSEU NEGATIVE 10/05/2016 1003   HGBUR NEGATIVE 10/05/2016 1003   BILIRUBINUR Negative 09/30/2019 1257   KETONESUR NEGATIVE 10/05/2016 1003   PROTEINUR Negative 09/30/2019 1257   PROTEINUR 30 (A) 02/20/2015 2137   UROBILINOGEN 0.2 05/26/2017 1122   UROBILINOGEN 0.2 10/05/2016 1003   NITRITE Negative 09/30/2019 1257   NITRITE NEGATIVE 10/05/2016 1003   LEUKOCYTESUR Negative 09/30/2019 1257    Pertinent Imaging:   Assessment & Plan: Just a by a family member or friend she want to try trospium 60 mg and I will see her back in  6 weeks.  We can always go back to oxybutynin or discuss other treatments.  This suggested perhaps a third line therapy may be reasonable and not a stress incontinence treatment  There are no diagnoses linked to this encounter.  No follow-ups on file.  Reece Packer, MD  [facility] [address], [place], [place] [address] [phone]

## 2020-03-03 ENCOUNTER — Ambulatory Visit: Payer: Medicare PPO | Admitting: Physical Therapy

## 2020-03-03 ENCOUNTER — Other Ambulatory Visit: Payer: Self-pay | Admitting: Internal Medicine

## 2020-03-03 ENCOUNTER — Encounter: Payer: Self-pay | Admitting: Physical Therapy

## 2020-03-03 DIAGNOSIS — M6281 Muscle weakness (generalized): Secondary | ICD-10-CM

## 2020-03-03 DIAGNOSIS — M79652 Pain in left thigh: Secondary | ICD-10-CM

## 2020-03-03 DIAGNOSIS — R209 Unspecified disturbances of skin sensation: Secondary | ICD-10-CM | POA: Diagnosis not present

## 2020-03-03 NOTE — Therapy (Signed)
[facility] [address] [place], [address] Phone: [phone]   Fax:  [phone]  Physical Therapy Treatment  Patient Details  Name: Sandra Hendrix MRN: [id number] Date of Birth: 1946/01/15 Referring Provider (PT): Rubie Maid, MD   Encounter Date: 03/03/2020  PT End of Session - 03/03/20 1023    Visit Number  6    Number of Visits  24   as needed   Date for PT Re-Evaluation  04/21/20    Authorization Type  [employer] reporting period from 01/28/2020    Authorization Time [place] 24 visits 01/28/20 - 04/24/20 [id number]    Authorization - Visit Number  6    Authorization - Number of Visits  24    Progress Note Due on Visit  10    PT Start Time  0900    PT Stop Time  0950    PT Time Calculation (min)  50 min    Equipment Utilized During Treatment  Gait belt    Activity Tolerance  Patient tolerated treatment well;No increased pain    Behavior During Therapy  WFL for tasks assessed/performed       Past Medical History:  Diagnosis Date  . Allergy   . Anxiety   . Arthritis   . Barrett's esophagus determined by endoscopy   . Cancer [facility])    Uterine Cancer  . Chronic kidney disease   . Depression   . Diabetes mellitus without complication ([place])   . Difficult intubation    "not sure what happened,asleep was told had problem with intubation"  . Diverticulosis   . Fatty liver   . GERD (gastroesophageal reflux disease)   . History of bronchitis   . History of hiatal hernia   . Hyperlipidemia   . Hypertension   . Sleep apnea    Use C- PAP  . Urinary incontinence   . Varicose veins     Past Surgical History:  Procedure Laterality Date  . CARDIAC CATHETERIZATION    . CHOLECYSTECTOMY    . COLONOSCOPY WITH PROPOFOL N/A 06/14/2016   Procedure: COLONOSCOPY WITH PROPOFOL;  Surgeon: Lollie Sails, MD;  Location: [facility] ENDOSCOPY;  Service: Endoscopy;  Laterality: N/A;  .  ESOPHAGOGASTRODUODENOSCOPY (EGD) WITH PROPOFOL N/A 06/14/2016   Procedure: ESOPHAGOGASTRODUODENOSCOPY (EGD) WITH PROPOFOL;  Surgeon: Lollie Sails, MD;  Location: [facility] ENDOSCOPY;  Service: Endoscopy;  Laterality: N/A;  . ESOPHAGOGASTRODUODENOSCOPY (EGD) WITH PROPOFOL N/A 06/12/2017   Procedure: ESOPHAGOGASTRODUODENOSCOPY (EGD) WITH PROPOFOL;  Surgeon: Lollie Sails, MD;  Location: [facility] ENDOSCOPY;  Service: Endoscopy;  Laterality: N/A;  . ESOPHAGOGASTRODUODENOSCOPY (EGD) WITH PROPOFOL N/A 08/25/2017   Procedure: ESOPHAGOGASTRODUODENOSCOPY (EGD) WITH PROPOFOL;  Surgeon: Lollie Sails, MD;  Location: [facility] ENDOSCOPY;  Service: Endoscopy;  Laterality: N/A;  . HYSTEROSCOPY WITH D & C N/A 01/02/2017   Procedure: DILATATION AND CURETTAGE /HYSTEROSCOPY;  Surgeon: Brayton Mars, MD;  Location: [facility] ORS;  Service: Gynecology;  Laterality: N/A;  . JOINT REPLACEMENT Right 2007   Total Knee Replacement  . JOINT REPLACEMENT Left 2004   Total Knee Replacement  . LAPAROSCOPIC HYSTERECTOMY Bilateral 03/01/2017   Procedure: HYSTERECTOMY TOTAL LAPAROSCOPIC BSO;  Surgeon: Mellody Drown, MD;  Location: [facility] ORS;  Service: Gynecology;  Laterality: Bilateral;  . ROTATOR CUFF REPAIR Right   . SENTINEL NODE BIOPSY N/A 03/01/2017   Procedure: SENTINEL NODE INJECTION AND BIOPSY;  Surgeon: Mellody Drown, MD;  Location: [facility] ORS;  Service: Gynecology;  Laterality: N/A;  .  TONSILLECTOMY     as a child  . TUBAL LIGATION    . UPPER GI ENDOSCOPY     x2    There were no vitals filed for this visit.  Subjective Assessment - 03/03/20 0906    Subjective  Patient reports no falls since last session, no pain upon arrival, and no further pain in her thigh since last time. She has been doing her HEP but has missed her usual exercise classes yesterday and last Friday for various life event reasons. She is taking a new medication for overactive bladder.    Pertinent History  Patient is a 74 y.o. 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 This patient's chief complaints consist of gradually worsening intermittent anterior L thigh pain that started with her hysterectomy surgery in 03/01/2017 leading to the following functional deficits: causes difficulty completing usual daily activities and pain seems proportional to the amount of activity she does each day. Usual activities include ADLs, IADLs, exercise at least 3x per week, spending time with family, keeping up her home, and engaging in hobbies such as hiking and crochet. Relevant past medical history and comorbidities include diabetes with peripheral neuropathy, sleep apnea, history of uterine cancer (followed by radiologist, OBGYN, and oncologist), diverticulosis, bilateral TKA, right rotator cuff repair, Barret's Esophagus, history of chronic lumbar and left hip pain (no spinal surgery), asthma and bronchitis, hx of neck pain.  Patient denies hx of stroke, seizures, lung problem (aside from above), major cardiac events, unexplained weight loss, changes in bowel or bladder problems associated with onset of chief complaint, new onset stumbling or dropping things except for right hand R hand is stiff (denies paresthesia).    Limitations  Sitting;House hold activities;Other (comment);Walking   causes difficulty completing usual daily activities and pain seems proportional to the amount of activity she does each day. Usual activities include ADLs, IADLs, exercise at least 3x per week, spending time w/family, housework, hobbies such as hiking.   Diagnostic tests  HIp x-ray 09/2018: "IMPRESSION:There is no acute fracture or dislocation of the left hip. There ismild degenerative joint space loss. Of note is degenerative changeof the SI joints and pubic symphysis."    Patient Stated Goals  "that it won't go any further"    Currently in Pain?  No/denies    Pain  Onset  More than a month ago        OBJECTIVE:   TREATMENT:  Neuromuscular Re-education:to improve, balance, postural strength, muscle activation patterns, and stabilization strength required for functional activities:  Standing on Airex, narrow stance unless otherwise noted: - horizontal head turns 2x20 - vertical head nods, 2x20 - eyes closed 2x60seconds - semi-tandem stance, 2x60 seconds each foot front.  - standing alternating toe double taps on cones 2x 10 each side with CGA to min A to prevent falls.  - tandem walking on aeromat x 5 feet x 10 with CGA  for safety.   Therapeutic exercise:to centralize symptoms and improve ROM, strength, muscular endurance, and activity tolerance required for successful completion of functional activities. - runner's step up to 6 inch step at base of stairs with touch down UE support and attempt at balance at top of step. 2x10 each side. CGA to SBA for safety.  - ambulation 1x100 feet as an active break during airex exercises when feet became tired. - front mini-lung and push back on bosu dome (round side up), with touch  down unilateral UE support and CGA - min A as needed. x20 each side.  - standing arch lifts on airex pad x 20 - seated ankle inversion against yellow theraband looped around forefoot and hooked around cane to teach her how to perform at home. Added to HEP.  - seated arch lifts on firm surface x 10 - seated ankle inversion into small ball.  - Education on HEP including handout    HOME EXERCISE PROGRAM Access Code: [email or web address] URL: [email or web address] Date: 01/28/2020 Prepared by: Rosita Kea  Exercises Standing Hip Flexion - 1-2 x daily - 2-3 sets - 10 reps - 2 seconds hold Diagonal Hip Extension with Resistance - 3-4 x weekly - 2-3 sets - 10 reps - 4 seconds hold    PT Education - 03/03/20 1023    Education Details  Exercise purpose/form. Self management techniques    Person(s) Educated   Patient    Methods  Explanation;Demonstration;Tactile cues;Verbal cues    Comprehension  Verbalized understanding;Returned demonstration;Verbal cues required;Tactile cues required;Need further instruction       PT Short Term Goals - 02/18/20 1402      PT SHORT TERM GOAL #1   Title  Be independent with initial home exercise program for self-management of symptoms.    Baseline  iniital HEP provided at IE (01/28/2020);    Time  2    Period  Weeks    Status  On-going    Target Date  02/11/20        PT Long Term Goals - 01/28/20 1103      PT LONG TERM GOAL #1   Title  Be independent with a long-term home exercise program for self-management of symptoms.     Baseline  iniital HEP provided at IE (01/28/2020);    Time  12    Period  Weeks    Status  New    Target Date  --   TARGET DATE FOR ALL LONG TERM GOALS: 04/21/2020     PT LONG TERM GOAL #2   Title  Reduce pain with functional activities to equal or less than 1/10 to allow patient to complete usual activities including ADLs, IADLs, and social engagement with less difficulty.    Baseline  up to 8/10 (01/28/2020);    Time  12    Period  Weeks    Status  New      PT LONG TERM GOAL #3   Title  Patient will demonstrate B LE strength 5/5 to demonstrate functional strength for independent gait, increased standing tolerance, lifting, carrying and increased ADL ability.    Baseline  see objective exam (01/28/2020);    Time  12    Period  Weeks    Status  New      PT LONG TERM GOAL #4   Title  Complete community, work and/or recreational activities without limitation due to current condition.     Baseline  limiting ability to complete completing usual daily activities and pain seems proportional to the amount of activity she does each day, includes ADLs, IADLs, exercise at least 3x per week, spending time with family, keeping up her home, relaxing, and engaging in hobbies such as hiking and crochet without difficulty (01/28/2020);    Time  12     Period  Weeks    Status  New            Plan - 03/03/20 1028    Clinical Impression Statement  Patient tolerated treatment well and continues to  make progress towards goals. Continued to address balance and strength deficits to decrease fall risk with high level activities. Patient would benefit from continued management of limiting condition by skilled physical therapist to address remaining impairments and functional limitations to work towards stated goals and return to PLOF or maximal functional independence.    Personal Factors and Comorbidities  Age;Comorbidity 3+;Sex;Time since onset of injury/illness/exacerbation;Past/Current Experience    Comorbidities  Relevant past medical history and comorbidities include diabetes with peripheral neuropathy, sleep apnea, history of uterine cancer (followed by radiologist, OBGYN, and oncologist), diverticulosis, bilateral TKA, right rotator cuff repair, Barret's Esophagus, history of chronic lumbar and left hip pain (no spinal surgery), asthma and bronchitis, hx of neck pain.    Examination-Activity Limitations  Squat;Stairs;Other   causes difficulty completing usual daily activities and pain seems proportional to the amount of activity she does each day, includes ADLs, IADLs, exercise at least 3x per week, spending time with family, keeping up her home,   Examination-Participation Restrictions  Community Activity;Yard Work;Cleaning;Shop;Laundry;Interpersonal Relationship;Other   hiking, crochet   Stability/Clinical Decision Making  Evolving/Moderate complexity    Rehab Potential  Fair    PT Frequency  2x / week    PT Duration  12 weeks   up to 12 weeks as needed   PT Treatment/Interventions  ADLs/Self Care Home Management;Biofeedback;Cryotherapy;Moist Heat;Therapeutic activities;Therapeutic exercise;Neuromuscular re-education;Patient/family education;Manual techniques;Passive range of motion;Dry needling;Spinal Manipulations;Joint Manipulations     PT Next Visit Plan  assess response to HEP and update as appropriate. Strengthening and stretching of L thigh, neural mobilization, consider explorying repeated movements    PT Home Exercise Plan  Medbridge Access Code: [email or web address], [email or web address] hip flexor stretch    Consulted and Agree with Plan of Care  Patient       Patient will benefit from skilled therapeutic intervention in order to improve the following deficits and impairments:  Pain, Decreased activity tolerance, Decreased range of motion, Impaired perceived functional ability, Obesity, Increased edema, Decreased strength, Hypomobility, Increased muscle spasms, Decreased endurance  Visit Diagnosis: Pain in left thigh  Unspecified disturbances of skin sensation  Muscle weakness (generalized)     Problem List Patient Active Problem List   Diagnosis Date Noted  . Left wrist sprain, sequela 02/24/2020  . Right hand pain 02/24/2020  . Bronchitis 01/22/2020  . Hepatic steatosis 07/18/2019  . Precordial pain 07/03/2019  . Epigastric pain 07/03/2019  . Vertigo, labyrinthine, unspecified laterality 03/30/2019  . Chronic hip pain, left 09/29/2018  . Pain of right tibia 09/29/2018  . Hyperlipidemia associated with type 2 diabetes mellitus ([place]) 03/29/2018  . Bilateral pes planus 03/29/2018  . SUI (stress urinary incontinence, female) 11/23/2017  . Vaginal atrophy 11/23/2017  . Obesity (BMI 30.0-34.9) 06/20/2017  . Carpal tunnel syndrome on right 06/20/2017  . Endometrioid adenocarcinoma of uterus ([place]) 01/19/2017  . OSA on CPAP 11/24/2016  . Cyst of ovary 11/16/2016  . Cervical stenosis (uterine cervix) 11/16/2016  . CKD (chronic kidney disease) stage 3, GFR 30-59 ml/min 11/07/2014  . GERD (gastroesophageal reflux disease) 12/26/2013  . Irritable bowel syndrome with constipation 12/26/2013  . Encounter for preventive health examination 10/21/2013  . Hyperlipidemia LDL goal <100 07/15/2013  . Chronic venous insufficiency  03/21/2013  . DM (diabetes mellitus), type 2 with renal complications ([place]) 31/49/7026  . Lack of libido 11/05/2012  . Barrett's esophagus 11/05/2012  . Hiatal hernia 11/05/2012  . Essential hypertension 11/05/2012    Everlean Alstrom. Graylon Good, PT, DPT 03/03/20, 10:29 AM  [facility] [address] [place], [address] Phone: [phone]   Fax:  [phone]  Name: MARLEN KOMAN MRN: [id number] Date of Birth: 05/15/1946

## 2020-03-05 ENCOUNTER — Ambulatory Visit: Payer: Medicare PPO | Admitting: Physical Therapy

## 2020-03-05 ENCOUNTER — Other Ambulatory Visit: Payer: Self-pay

## 2020-03-05 ENCOUNTER — Encounter: Payer: Self-pay | Admitting: Physical Therapy

## 2020-03-05 DIAGNOSIS — R209 Unspecified disturbances of skin sensation: Secondary | ICD-10-CM | POA: Diagnosis not present

## 2020-03-05 DIAGNOSIS — M79652 Pain in left thigh: Secondary | ICD-10-CM | POA: Diagnosis not present

## 2020-03-05 DIAGNOSIS — M6281 Muscle weakness (generalized): Secondary | ICD-10-CM

## 2020-03-05 NOTE — Therapy (Signed)
[facility] [address] [place], [address] Phone: [phone]   Fax:  [phone]  Physical Therapy Treatment  Patient Details  Name: Sandra Hendrix MRN: [id number] Date of Birth: [birth year] Referring Provider (PT): Rubie Maid, MD   Encounter Date: 03/05/2020   PT End of Session - 03/05/20 0932    Visit Number 7    Number of Visits 24   as needed   Date for PT Re-Evaluation 04/21/20    Authorization Type [employer] reporting period from 01/28/2020    Authorization Time [place] 24 visits 01/28/20 - 04/24/20 [id number]    Authorization - Visit Number 7    Authorization - Number of Visits 24    Progress Note Due on Visit 10    PT Start Time 0905    PT Stop Time 0943    PT Time Calculation (min) 38 min    Equipment Utilized During Treatment Gait belt    Activity Tolerance Patient tolerated treatment well;No increased pain    Behavior During Therapy WFL for tasks assessed/performed           Past Medical History:  Diagnosis Date  . Allergy   . Anxiety   . Arthritis   . Barrett's esophagus determined by endoscopy   . Cancer [facility])    Uterine Cancer  . Chronic kidney disease   . Depression   . Diabetes mellitus without complication ([place])   . Difficult intubation    "not sure what happened,asleep was told had problem with intubation"  . Diverticulosis   . Fatty liver   . GERD (gastroesophageal reflux disease)   . History of bronchitis   . History of hiatal hernia   . Hyperlipidemia   . Hypertension   . Sleep apnea    Use C- PAP  . Urinary incontinence   . Varicose veins     Past Surgical History:  Procedure Laterality Date  . CARDIAC CATHETERIZATION    . CHOLECYSTECTOMY    . COLONOSCOPY WITH PROPOFOL N/A 06/14/2016   Procedure: COLONOSCOPY WITH PROPOFOL;  Surgeon: Lollie Sails, MD;  Location: [facility] ENDOSCOPY;  Service: Endoscopy;  Laterality: N/A;  .  ESOPHAGOGASTRODUODENOSCOPY (EGD) WITH PROPOFOL N/A 06/14/2016   Procedure: ESOPHAGOGASTRODUODENOSCOPY (EGD) WITH PROPOFOL;  Surgeon: Lollie Sails, MD;  Location: [facility] ENDOSCOPY;  Service: Endoscopy;  Laterality: N/A;  . ESOPHAGOGASTRODUODENOSCOPY (EGD) WITH PROPOFOL N/A 06/12/2017   Procedure: ESOPHAGOGASTRODUODENOSCOPY (EGD) WITH PROPOFOL;  Surgeon: Lollie Sails, MD;  Location: [facility] ENDOSCOPY;  Service: Endoscopy;  Laterality: N/A;  . ESOPHAGOGASTRODUODENOSCOPY (EGD) WITH PROPOFOL N/A 08/25/2017   Procedure: ESOPHAGOGASTRODUODENOSCOPY (EGD) WITH PROPOFOL;  Surgeon: Lollie Sails, MD;  Location: [facility] ENDOSCOPY;  Service: Endoscopy;  Laterality: N/A;  . HYSTEROSCOPY WITH D & C N/A 01/02/2017   Procedure: DILATATION AND CURETTAGE /HYSTEROSCOPY;  Surgeon: Brayton Mars, MD;  Location: [facility] ORS;  Service: Gynecology;  Laterality: N/A;  . JOINT REPLACEMENT Right 2007   Total Knee Replacement  . JOINT REPLACEMENT Left 2004   Total Knee Replacement  . LAPAROSCOPIC HYSTERECTOMY Bilateral 03/01/2017   Procedure: HYSTERECTOMY TOTAL LAPAROSCOPIC BSO;  Surgeon: Mellody Drown, MD;  Location: [facility] ORS;  Service: Gynecology;  Laterality: Bilateral;  . ROTATOR CUFF REPAIR Right   . SENTINEL NODE BIOPSY N/A 03/01/2017   Procedure: SENTINEL NODE INJECTION AND BIOPSY;  Surgeon: Mellody Drown, MD;  Location: [facility] ORS;  Service: Gynecology;  Laterality: N/A;  . TONSILLECTOMY     as a child  .  TUBAL LIGATION    . UPPER GI ENDOSCOPY     x2    There were no vitals filed for this visit.   Subjective Assessment - 03/05/20 0917    Subjective Patient reports no pain since last session or upon arrival. Was working outside a lot yesterday. HEP going well.    Pertinent History Patient is a 74 y.o. 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 This patient's chief complaints consist of gradually  worsening intermittent anterior L thigh pain that started with her hysterectomy surgery in 03/01/2017 leading to the following functional deficits: causes difficulty completing usual daily activities and pain seems proportional to the amount of activity she does each day. Usual activities include ADLs, IADLs, exercise at least 3x per week, spending time with family, keeping up her home, and engaging in hobbies such as hiking and crochet. Relevant past medical history and comorbidities include diabetes with peripheral neuropathy, sleep apnea, history of uterine cancer (followed by radiologist, OBGYN, and oncologist), diverticulosis, bilateral TKA, right rotator cuff repair, Barret's Esophagus, history of chronic lumbar and left hip pain (no spinal surgery), asthma and bronchitis, hx of neck pain.  Patient denies hx of stroke, seizures, lung problem (aside from above), major cardiac events, unexplained weight loss, changes in bowel or bladder problems associated with onset of chief complaint, new onset stumbling or dropping things except for right hand R hand is stiff (denies paresthesia).    Limitations Sitting;House hold activities;Other (comment);Walking   causes difficulty completing usual daily activities and pain seems proportional to the amount of activity she does each day. Usual activities include ADLs, IADLs, exercise at least 3x per week, spending time w/family, housework, hobbies such as hiking.   Diagnostic tests HIp x-ray 09/2018: "IMPRESSION:There is no acute fracture or dislocation of the left hip. There ismild degenerative joint space loss. Of note is degenerative changeof the SI joints and pubic symphysis."    Patient Stated Goals "that it won't go any further"    Currently in Pain? No/denies    Pain Onset More than a month ago             OBJECTIVE:   TREATMENT:   Therapeutic exercise:to centralize symptoms and improve ROM, strength, muscular endurance, and activity tolerance  required for successful completion of functional activities. - seated arch lifts on firm surface x 10 - seated ankle inversion against yellow theraband looped around forefoot and hooked around 2nd chair leg. Left side only for 5 reps, difficult to get activation. Discontinued.  - seated figure 4 position ankle inversion against single yellow theraband around forefoot on R side and AROM no theraband on L side. 2x20 each side. Much improved form and activation with cuing, able to set up independently, and greatly improved activation of L ankle following in other positions.  Added to HEP.  - runner's step up to 6 inch step at base of stairs with touch down UE support and attempt at balance at top of step. 3x10 each side. Supervision for safety. Updated in HEP.   - ambulation1x100 feet as an active break during airex exercises when feet became tired.  - Education on HEP including handout   Neuromuscular Re-education:to improve, balance, postural strength, muscle activation patterns, and stabilization strength required for functional activities:  Standing on Airex, narrow stance unless otherwise noted: - horizontal head turns 2x20 - vertical head nods, 2x20 - eyes closed 2x60seconds - tandem stance,  2x60 seconds each foot front.  HOME EXERCISE PROGRAM Access Code: [email or web address] URL: [email or web address] Date: 03/05/2020 Prepared by: Rosita Kea  Exercises Runner's Climb - 1 x daily - 2-3 sets - 10-15 reps - 2-3 seconds hold Diagonal Hip Extension with Resistance - 3-4 x weekly - 2-3 sets - 10 reps - 4 seconds hold Seated Figure 4 Ankle Inversion with Resistance - 1 x daily - 2-3 sets - 20 reps - 1 second hold    PT Education - 03/05/20 0932    Education Details Exercise purpose/form. Self management techniques    Person(s) Educated Patient    Methods Explanation;Demonstration;Tactile cues;Verbal cues;Handout    Comprehension Verbalized understanding;Returned  demonstration;Verbal cues required;Tactile cues required;Need further instruction            PT Short Term Goals - 02/18/20 1402      PT SHORT TERM GOAL #1   Title Be independent with initial home exercise program for self-management of symptoms.    Baseline iniital HEP provided at IE (01/28/2020);    Time 2    Period Weeks    Status On-going    Target Date 02/11/20             PT Long Term Goals - 01/28/20 1103      PT LONG TERM GOAL #1   Title Be independent with a long-term home exercise program for self-management of symptoms.     Baseline iniital HEP provided at IE (01/28/2020);    Time 12    Period Weeks    Status New    Target Date --   TARGET DATE FOR ALL LONG TERM GOALS: 04/21/2020     PT LONG TERM GOAL #2   Title Reduce pain with functional activities to equal or less than 1/10 to allow patient to complete usual activities including ADLs, IADLs, and social engagement with less difficulty.    Baseline up to 8/10 (01/28/2020);    Time 12    Period Weeks    Status New      PT LONG TERM GOAL #3   Title Patient will demonstrate B LE strength 5/5 to demonstrate functional strength for independent gait, increased standing tolerance, lifting, carrying and increased ADL ability.    Baseline see objective exam (01/28/2020);    Time 12    Period Weeks    Status New      PT LONG TERM GOAL #4   Title Complete community, work and/or recreational activities without limitation due to current condition.     Baseline limiting ability to complete completing usual daily activities and pain seems proportional to the amount of activity she does each day, includes ADLs, IADLs, exercise at least 3x per week, spending time with family, keeping up her home, relaxing, and engaging in hobbies such as hiking and crochet without difficulty (01/28/2020);    Time 12    Period Weeks    Status New                 Plan - 03/05/20 0940    Clinical Impression Statement Patient tolerated  treatment well and demonstrated greatly improved left posterior tibialis activation following figure 4 ankle inversion AROM exercise. Continued to work on strengthening and balance for decreased fall risk. Patient would benefit from continued management of limiting condition by skilled physical therapist to address remaining impairments and functional limitations to work towards stated goals and return to PLOF or maximal functional independence.    Personal Factors and Comorbidities Age;Comorbidity 3+;Sex;Time since onset  of injury/illness/exacerbation;Past/Current Experience    Comorbidities Relevant past medical history and comorbidities include diabetes with peripheral neuropathy, sleep apnea, history of uterine cancer (followed by radiologist, OBGYN, and oncologist), diverticulosis, bilateral TKA, right rotator cuff repair, Barret's Esophagus, history of chronic lumbar and left hip pain (no spinal surgery), asthma and bronchitis, hx of neck pain.    Examination-Activity Limitations Squat;Stairs;Other   causes difficulty completing usual daily activities and pain seems proportional to the amount of activity she does each day, includes ADLs, IADLs, exercise at least 3x per week, spending time with family, keeping up her home,   Examination-Participation Restrictions Community Activity;Yard Work;Cleaning;Shop;Laundry;Interpersonal Relationship;Other   hiking, crochet   Stability/Clinical Decision Making Evolving/Moderate complexity    Rehab Potential Fair    PT Frequency 2x / week    PT Duration 12 weeks   up to 12 weeks as needed   PT Treatment/Interventions ADLs/Self Care Home Management;Biofeedback;Cryotherapy;Moist Heat;Therapeutic activities;Therapeutic exercise;Neuromuscular re-education;Patient/family education;Manual techniques;Passive range of motion;Dry needling;Spinal Manipulations;Joint Manipulations    PT Next Visit Plan continued balance and LE strengthening as tolerated    PT Home Exercise  Plan Medbridge Access Code: [email or web address], [email or web address] hip flexor stretch    Consulted and Agree with Plan of Care Patient           Patient will benefit from skilled therapeutic intervention in order to improve the following deficits and impairments:  Pain, Decreased activity tolerance, Decreased range of motion, Impaired perceived functional ability, Obesity, Increased edema, Decreased strength, Hypomobility, Increased muscle spasms, Decreased endurance  Visit Diagnosis: Pain in left thigh  Unspecified disturbances of skin sensation  Muscle weakness (generalized)     Problem List Patient Active Problem List   Diagnosis Date Noted  . Left wrist sprain, sequela 02/24/2020  . Right hand pain 02/24/2020  . Bronchitis 01/22/2020  . Hepatic steatosis 07/18/2019  . Precordial pain 07/03/2019  . Epigastric pain 07/03/2019  . Vertigo, labyrinthine, unspecified laterality 03/30/2019  . Chronic hip pain, left 09/29/2018  . Pain of right tibia 09/29/2018  . Hyperlipidemia associated with type 2 diabetes mellitus ([place]) 03/29/2018  . Bilateral pes planus 03/29/2018  . SUI (stress urinary incontinence, female) 11/23/2017  . Vaginal atrophy 11/23/2017  . Obesity (BMI 30.0-34.9) 06/20/2017  . Carpal tunnel syndrome on right 06/20/2017  . Endometrioid adenocarcinoma of uterus ([place]) 01/19/2017  . OSA on CPAP 11/24/2016  . Cyst of ovary 11/16/2016  . Cervical stenosis (uterine cervix) 11/16/2016  . CKD (chronic kidney disease) stage 3, GFR 30-59 ml/min 11/07/2014  . GERD (gastroesophageal reflux disease) 12/26/2013  . Irritable bowel syndrome with constipation 12/26/2013  . Encounter for preventive health examination 10/21/2013  . Hyperlipidemia LDL goal <100 07/15/2013  . Chronic venous insufficiency 03/21/2013  . DM (diabetes mellitus), type 2 with renal complications ([place]) 81/09/7508  . Lack of libido 11/05/2012  . Barrett's esophagus 11/05/2012  . Hiatal hernia 11/05/2012  . Essential  hypertension 11/05/2012    Everlean Alstrom. Graylon Good, PT, DPT 03/05/20, 9:41 AM  [facility] [address], [place], [address] Phone: [phone]   Fax:  [phone]  Name: Sandra Hendrix MRN: [id number] Date of Birth: [birth year]

## 2020-03-10 ENCOUNTER — Other Ambulatory Visit: Payer: Self-pay

## 2020-03-10 ENCOUNTER — Encounter: Payer: Self-pay | Admitting: Physical Therapy

## 2020-03-10 ENCOUNTER — Ambulatory Visit: Payer: Medicare PPO | Admitting: Physical Therapy

## 2020-03-10 DIAGNOSIS — M79652 Pain in left thigh: Secondary | ICD-10-CM

## 2020-03-10 DIAGNOSIS — M6281 Muscle weakness (generalized): Secondary | ICD-10-CM | POA: Diagnosis not present

## 2020-03-10 DIAGNOSIS — R209 Unspecified disturbances of skin sensation: Secondary | ICD-10-CM

## 2020-03-10 NOTE — Therapy (Signed)
[facility] [address] [place], [address] Phone: [phone]   Fax:  [phone]  Physical Therapy Treatment  Patient Details  Name: Sandra Hendrix MRN: [id number] Date of Birth: 11-26-1945 Referring Provider (PT): Rubie Maid, MD   Encounter Date: 03/10/2020   PT End of Session - 03/10/20 0918    Visit Number 8    Number of Visits 24   as needed   Date for PT Re-Evaluation 04/21/20    Authorization Type [employer] reporting period from 01/28/2020    Authorization Time [place] 24 visits 01/28/20 - 04/24/20 [id number]    Authorization - Visit Number 8    Authorization - Number of Visits 24    Progress Note Due on Visit 10    PT Start Time 0901    PT Stop Time 0940    PT Time Calculation (min) 39 min    Equipment Utilized During Treatment Gait belt    Activity Tolerance Patient tolerated treatment well;No increased pain    Behavior During Therapy WFL for tasks assessed/performed           Past Medical History:  Diagnosis Date  . Allergy   . Anxiety   . Arthritis   . Barrett's esophagus determined by endoscopy   . Cancer [facility])    Uterine Cancer  . Chronic kidney disease   . Depression   . Diabetes mellitus without complication ([place])   . Difficult intubation    "not sure what happened,asleep was told had problem with intubation"  . Diverticulosis   . Fatty liver   . GERD (gastroesophageal reflux disease)   . History of bronchitis   . History of hiatal hernia   . Hyperlipidemia   . Hypertension   . Sleep apnea    Use C- PAP  . Urinary incontinence   . Varicose veins     Past Surgical History:  Procedure Laterality Date  . CARDIAC CATHETERIZATION    . CHOLECYSTECTOMY    . COLONOSCOPY WITH PROPOFOL N/A 06/14/2016   Procedure: COLONOSCOPY WITH PROPOFOL;  Surgeon: Lollie Sails, MD;  Location: [facility] ENDOSCOPY;  Service: Endoscopy;  Laterality: N/A;  .  ESOPHAGOGASTRODUODENOSCOPY (EGD) WITH PROPOFOL N/A 06/14/2016   Procedure: ESOPHAGOGASTRODUODENOSCOPY (EGD) WITH PROPOFOL;  Surgeon: Lollie Sails, MD;  Location: [facility] ENDOSCOPY;  Service: Endoscopy;  Laterality: N/A;  . ESOPHAGOGASTRODUODENOSCOPY (EGD) WITH PROPOFOL N/A 06/12/2017   Procedure: ESOPHAGOGASTRODUODENOSCOPY (EGD) WITH PROPOFOL;  Surgeon: Lollie Sails, MD;  Location: [facility] ENDOSCOPY;  Service: Endoscopy;  Laterality: N/A;  . ESOPHAGOGASTRODUODENOSCOPY (EGD) WITH PROPOFOL N/A 08/25/2017   Procedure: ESOPHAGOGASTRODUODENOSCOPY (EGD) WITH PROPOFOL;  Surgeon: Lollie Sails, MD;  Location: [facility] ENDOSCOPY;  Service: Endoscopy;  Laterality: N/A;  . HYSTEROSCOPY WITH D & C N/A 01/02/2017   Procedure: DILATATION AND CURETTAGE /HYSTEROSCOPY;  Surgeon: Brayton Mars, MD;  Location: [facility] ORS;  Service: Gynecology;  Laterality: N/A;  . JOINT REPLACEMENT Right 2007   Total Knee Replacement  . JOINT REPLACEMENT Left 2004   Total Knee Replacement  . LAPAROSCOPIC HYSTERECTOMY Bilateral 03/01/2017   Procedure: HYSTERECTOMY TOTAL LAPAROSCOPIC BSO;  Surgeon: Mellody Drown, MD;  Location: [facility] ORS;  Service: Gynecology;  Laterality: Bilateral;  . ROTATOR CUFF REPAIR Right   . SENTINEL NODE BIOPSY N/A 03/01/2017   Procedure: SENTINEL NODE INJECTION AND BIOPSY;  Surgeon: Mellody Drown, MD;  Location: [facility] ORS;  Service: Gynecology;  Laterality: N/A;  . TONSILLECTOMY     as a child  .  TUBAL LIGATION    . UPPER GI ENDOSCOPY     x2    There were no vitals filed for this visit.   Subjective Assessment - 03/10/20 0904    Subjective Patient reports she is feeling well today and has had one tinge of thigh pain since last session. She was not sore following last session. HEP is going well and she is able to use the yellow theraband for both feet now. No falls since last session.    Pertinent History Patient is a 74 y.o. 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 This patient's chief complaints consist of gradually worsening intermittent anterior L thigh pain that started with her hysterectomy surgery in 03/01/2017 leading to the following functional deficits: causes difficulty completing usual daily activities and pain seems proportional to the amount of activity she does each day. Usual activities include ADLs, IADLs, exercise at least 3x per week, spending time with family, keeping up her home, and engaging in hobbies such as hiking and crochet. Relevant past medical history and comorbidities include diabetes with peripheral neuropathy, sleep apnea, history of uterine cancer (followed by radiologist, OBGYN, and oncologist), diverticulosis, bilateral TKA, right rotator cuff repair, Barret's Esophagus, history of chronic lumbar and left hip pain (no spinal surgery), asthma and bronchitis, hx of neck pain.  Patient denies hx of stroke, seizures, lung problem (aside from above), major cardiac events, unexplained weight loss, changes in bowel or bladder problems associated with onset of chief complaint, new onset stumbling or dropping things except for right hand R hand is stiff (denies paresthesia).    Limitations Sitting;House hold activities;Other (comment);Walking   causes difficulty completing usual daily activities and pain seems proportional to the amount of activity she does each day. Usual activities include ADLs, IADLs, exercise at least 3x per week, spending time w/family, housework, hobbies such as hiking.   Diagnostic tests HIp x-ray 09/2018: "IMPRESSION:There is no acute fracture or dislocation of the left hip. There ismild degenerative joint space loss. Of note is degenerative changeof the SI joints and pubic symphysis."    Patient Stated Goals "that it won't go any further"    Currently in Pain? No/denies    Pain Onset More than a month ago             OBJECTIVE:    TREATMENT:   Therapeutic exercise:to centralize symptoms and improve ROM, strength, muscular endurance, and activity tolerance required for successful completion of functional activities.  - seated figure 4 position ankle inversion against single yellow theraband around forefoot on each side 2x25 total each side. Able to set up independently. Required cuing for improved form on left side especially.  - Toe Yoga: great toe extension with small toes flexion pressure into floor, repeated 2 sec holds; small toes extension with great toe flexion pressure into floor, repeated 2 sec holds. Ball of foot and heel maintains contact with floor. To improve intrinsic foot muscle activation and strength in order to better support arch and intrinsic foot structures. 1x20 reps each direction, B feet working at the same time.  - Toe splays, repeated 2 sec holds. Ball of foot and heel maintains contact with floor. To improve intrinsic foot muscle activation and strength in order to better support arch and intrinsic foot structures. x20 both feet together.  - runner's step up to 6 inch step at base of stairs with touch down UE support and attempt  at balance at top of step. 3x10 each side. Supervision for safety.Cuing to improve hold time and ROM.   -Education on HEP including handout   Neuromuscular Re-education:to improve, balance, postural strength, muscle activation patterns, and stabilization strength required for functional activities:  Standing on firm surface with treadmill bars in front for safety - horizontal head turns, tandem stance 1x20 each foot front - vertical head nods,  tandem stance 1x20 each foot front - eyes closed, tandem stance, 1x60seconds each foot front   HOME EXERCISE PROGRAM Access Code: [email or web address] URL: [email or web address] Date: 03/10/2020 Prepared by: Rosita Kea  Exercises Runner's Climb - 3 x weekly - 3 sets - 15 reps - 3 seconds hold Diagonal  Hip Extension with Resistance - 3 x weekly - 3 sets - 10 reps - 4 seconds hold Tandem Stance with Head Rotation - 3 x weekly - 2 sets - 20 reps Tandem Stance with Head Nods - 3 x weekly - 2 sets - 20 reps Tandem Stance with Eyes Closed - 3 x weekly - 2 reps - 1 minute hold Seated Figure 4 Ankle Inversion with Resistance - 3 x weekly - 2 sets - 20 reps - 1 second hold Seated Lesser Toes Extension - 3 x weekly - 1 sets - 20 reps - 2 seconds hold Seated Great Toe Extension - 3 x weekly - 1 sets - 20 reps - 2 seconds hold Toe Spreading - 3 x weekly - 1 sets - 20 reps - 2 seconds hold     PT Education - 03/10/20 0918    Education Details Exercise purpose/form. Self management techniques. HEP    Person(s) Educated Patient    Methods Explanation;Demonstration;Tactile cues;Verbal cues;Handout    Comprehension Verbalized understanding;Returned demonstration;Verbal cues required;Tactile cues required;Need further instruction            PT Short Term Goals - 02/18/20 1402      PT SHORT TERM GOAL #1   Title Be independent with initial home exercise program for self-management of symptoms.    Baseline iniital HEP provided at IE (01/28/2020);    Time 2    Period Weeks    Status On-going    Target Date 02/11/20             PT Long Term Goals - 01/28/20 1103      PT LONG TERM GOAL #1   Title Be independent with a long-term home exercise program for self-management of symptoms.     Baseline iniital HEP provided at IE (01/28/2020);    Time 12    Period Weeks    Status New    Target Date --   TARGET DATE FOR ALL LONG TERM GOALS: 04/21/2020     PT LONG TERM GOAL #2   Title Reduce pain with functional activities to equal or less than 1/10 to allow patient to complete usual activities including ADLs, IADLs, and social engagement with less difficulty.    Baseline up to 8/10 (01/28/2020);    Time 12    Period Weeks    Status New      PT LONG TERM GOAL #3   Title Patient will demonstrate B LE  strength 5/5 to demonstrate functional strength for independent gait, increased standing tolerance, lifting, carrying and increased ADL ability.    Baseline see objective exam (01/28/2020);    Time 12    Period Weeks    Status New      PT LONG TERM GOAL #4   Title Complete community,  work and/or recreational activities without limitation due to current condition.     Baseline limiting ability to complete completing usual daily activities and pain seems proportional to the amount of activity she does each day, includes ADLs, IADLs, exercise at least 3x per week, spending time with family, keeping up her home, relaxing, and engaging in hobbies such as hiking and crochet without difficulty (01/28/2020);    Time 12    Period Weeks    Status New                 Plan - 03/10/20 1620    Clinical Impression Statement Patient tolerated treatment well this session with no pain. Session focused on HEP update for long term use and refining form on current exercises. Patient is showing good progress with decreased pain and no falls for the last two weeks. Patient would benefit from continued management of limiting condition by skilled physical therapist to address remaining impairments and functional limitations to work towards stated goals and return to PLOF or maximal functional independence.    Personal Factors and Comorbidities Age;Comorbidity 3+;Sex;Time since onset of injury/illness/exacerbation;Past/Current Experience    Comorbidities Relevant past medical history and comorbidities include diabetes with peripheral neuropathy, sleep apnea, history of uterine cancer (followed by radiologist, OBGYN, and oncologist), diverticulosis, bilateral TKA, right rotator cuff repair, Barret's Esophagus, history of chronic lumbar and left hip pain (no spinal surgery), asthma and bronchitis, hx of neck pain.    Examination-Activity Limitations Squat;Stairs;Other   causes difficulty completing usual daily activities  and pain seems proportional to the amount of activity she does each day, includes ADLs, IADLs, exercise at least 3x per week, spending time with family, keeping up her home,   Examination-Participation Restrictions Community Activity;Yard Work;Cleaning;Shop;Laundry;Interpersonal Relationship;Other   hiking, crochet   Stability/Clinical Decision Making Evolving/Moderate complexity    Rehab Potential Fair    PT Frequency 2x / week    PT Duration 12 weeks   up to 12 weeks as needed   PT Treatment/Interventions ADLs/Self Care Home Management;Biofeedback;Cryotherapy;Moist Heat;Therapeutic activities;Therapeutic exercise;Neuromuscular re-education;Patient/family education;Manual techniques;Passive range of motion;Dry needling;Spinal Manipulations;Joint Manipulations    PT Next Visit Plan continued balance and LE strengthening as tolerated    PT Home Exercise Plan Medbridge Access Code: [email or web address], [email or web address] hip flexor stretch    Consulted and Agree with Plan of Care Patient           Patient will benefit from skilled therapeutic intervention in order to improve the following deficits and impairments:  Pain, Decreased activity tolerance, Decreased range of motion, Impaired perceived functional ability, Obesity, Increased edema, Decreased strength, Hypomobility, Increased muscle spasms, Decreased endurance  Visit Diagnosis: Pain in left thigh  Unspecified disturbances of skin sensation  Muscle weakness (generalized)     Problem List Patient Active Problem List   Diagnosis Date Noted  . Left wrist sprain, sequela 02/24/2020  . Right hand pain 02/24/2020  . Bronchitis 01/22/2020  . Hepatic steatosis 07/18/2019  . Precordial pain 07/03/2019  . Epigastric pain 07/03/2019  . Vertigo, labyrinthine, unspecified laterality 03/30/2019  . Chronic hip pain, left 09/29/2018  . Pain of right tibia 09/29/2018  . Hyperlipidemia associated with type 2 diabetes mellitus ([place]) 03/29/2018  . Bilateral pes  planus 03/29/2018  . SUI (stress urinary incontinence, female) 11/23/2017  . Vaginal atrophy 11/23/2017  . Obesity (BMI 30.0-34.9) 06/20/2017  . Carpal tunnel syndrome on right 06/20/2017  . Endometrioid adenocarcinoma of uterus ([place]) 01/19/2017  . OSA on CPAP 11/24/2016  . Cyst of ovary  11/16/2016  . Cervical stenosis (uterine cervix) 11/16/2016  . CKD (chronic kidney disease) stage 3, GFR 30-59 ml/min 11/07/2014  . GERD (gastroesophageal reflux disease) 12/26/2013  . Irritable bowel syndrome with constipation 12/26/2013  . Encounter for preventive health examination 10/21/2013  . Hyperlipidemia LDL goal <100 07/15/2013  . Chronic venous insufficiency 03/21/2013  . DM (diabetes mellitus), type 2 with renal complications ([place]) 94/70/9628  . Lack of libido 11/05/2012  . Barrett's esophagus 11/05/2012  . Hiatal hernia 11/05/2012  . Essential hypertension 11/05/2012    Everlean Alstrom. Graylon Good, PT, DPT 03/10/20, 4:21 PM  [facility] [address] [place], [address] Phone: [phone]   Fax:  [phone]  Name: KEYUNDRA FANT MRN: [id number] Date of Birth: May 21, 1946

## 2020-03-12 ENCOUNTER — Other Ambulatory Visit: Payer: Self-pay

## 2020-03-12 ENCOUNTER — Encounter: Payer: Self-pay | Admitting: Physical Therapy

## 2020-03-12 ENCOUNTER — Ambulatory Visit: Payer: Medicare PPO | Admitting: Physical Therapy

## 2020-03-12 DIAGNOSIS — M6281 Muscle weakness (generalized): Secondary | ICD-10-CM

## 2020-03-12 DIAGNOSIS — R209 Unspecified disturbances of skin sensation: Secondary | ICD-10-CM | POA: Diagnosis not present

## 2020-03-12 DIAGNOSIS — M79652 Pain in left thigh: Secondary | ICD-10-CM | POA: Diagnosis not present

## 2020-03-12 NOTE — Therapy (Signed)
[facility] [address] [place], [address] Phone: [phone]   Fax:  [phone]  Physical Therapy Treatment / Discharge Summary Reporting Period: 01/28/2020 - 03/12/2020  Patient Details  Name: Sandra Hendrix MRN: [id number] Date of Birth: 21-Oct-1945 Referring Provider (PT): Rubie Maid, MD   Encounter Date: 03/12/2020   PT End of Session - 03/12/20 0919    Visit Number 9    Number of Visits 24   as needed   Date for PT Re-Evaluation 04/21/20    Authorization Type [employer] reporting period from 01/28/2020    Authorization Time [place] 24 visits 01/28/20 - 04/24/20 [id number]    Authorization - Visit Number 9    Authorization - Number of Visits 24    Progress Note Due on Visit 10    PT Start Time 0905    PT Stop Time 0945    PT Time Calculation (min) 40 min    Equipment Utilized During Treatment Gait belt    Activity Tolerance Patient tolerated treatment well;No increased pain    Behavior During Therapy WFL for tasks assessed/performed           Past Medical History:  Diagnosis Date  . Allergy   . Anxiety   . Arthritis   . Barrett's esophagus determined by endoscopy   . Cancer [facility])    Uterine Cancer  . Chronic kidney disease   . Depression   . Diabetes mellitus without complication ([place])   . Difficult intubation    "not sure what happened,asleep was told had problem with intubation"  . Diverticulosis   . Fatty liver   . GERD (gastroesophageal reflux disease)   . History of bronchitis   . History of hiatal hernia   . Hyperlipidemia   . Hypertension   . Sleep apnea    Use C- PAP  . Urinary incontinence   . Varicose veins     Past Surgical History:  Procedure Laterality Date  . CARDIAC CATHETERIZATION    . CHOLECYSTECTOMY    . COLONOSCOPY WITH PROPOFOL N/A 06/14/2016   Procedure: COLONOSCOPY WITH PROPOFOL;  Surgeon: Lollie Sails, MD;  Location: [facility]  ENDOSCOPY;  Service: Endoscopy;  Laterality: N/A;  . ESOPHAGOGASTRODUODENOSCOPY (EGD) WITH PROPOFOL N/A 06/14/2016   Procedure: ESOPHAGOGASTRODUODENOSCOPY (EGD) WITH PROPOFOL;  Surgeon: Lollie Sails, MD;  Location: [facility] ENDOSCOPY;  Service: Endoscopy;  Laterality: N/A;  . ESOPHAGOGASTRODUODENOSCOPY (EGD) WITH PROPOFOL N/A 06/12/2017   Procedure: ESOPHAGOGASTRODUODENOSCOPY (EGD) WITH PROPOFOL;  Surgeon: Lollie Sails, MD;  Location: [facility] ENDOSCOPY;  Service: Endoscopy;  Laterality: N/A;  . ESOPHAGOGASTRODUODENOSCOPY (EGD) WITH PROPOFOL N/A 08/25/2017   Procedure: ESOPHAGOGASTRODUODENOSCOPY (EGD) WITH PROPOFOL;  Surgeon: Lollie Sails, MD;  Location: [facility] ENDOSCOPY;  Service: Endoscopy;  Laterality: N/A;  . HYSTEROSCOPY WITH D & C N/A 01/02/2017   Procedure: DILATATION AND CURETTAGE /HYSTEROSCOPY;  Surgeon: Brayton Mars, MD;  Location: [facility] ORS;  Service: Gynecology;  Laterality: N/A;  . JOINT REPLACEMENT Right 2007   Total Knee Replacement  . JOINT REPLACEMENT Left 2004   Total Knee Replacement  . LAPAROSCOPIC HYSTERECTOMY Bilateral 03/01/2017   Procedure: HYSTERECTOMY TOTAL LAPAROSCOPIC BSO;  Surgeon: Mellody Drown, MD;  Location: [facility] ORS;  Service: Gynecology;  Laterality: Bilateral;  . ROTATOR CUFF REPAIR Right   . SENTINEL NODE BIOPSY N/A 03/01/2017   Procedure: SENTINEL NODE INJECTION AND BIOPSY;  Surgeon: Mellody Drown, MD;  Location: [facility] ORS;  Service: Gynecology;  Laterality: N/A;  .  TONSILLECTOMY     as a child  . TUBAL LIGATION    . UPPER GI ENDOSCOPY     x2    There were no vitals filed for this visit.   Subjective Assessment - 03/12/20 0918    Subjective Patient reports she is feeling well today and her HEP has been going well. Occasionally has a bit of shooting pain in the thigh but feels it is much better since she has been back to exercise. She has not had any falls since last session. She would like to review the band exercise for the foot and the  step excercise. At end of session patient reports she feels ready to discharge. Reports pain has been no higher than 1/10 in the last two weeks and she doens't feel she has any remaining funcitonal limitations as long as she uses hiking poles when hiking.    Pertinent History Patient is a 74 y.o. 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 This patient's chief complaints consist of gradually worsening intermittent anterior L thigh pain that started with her hysterectomy surgery in 03/01/2017 leading to the following functional deficits: causes difficulty completing usual daily activities and pain seems proportional to the amount of activity she does each day. Usual activities include ADLs, IADLs, exercise at least 3x per week, spending time with family, keeping up her home, and engaging in hobbies such as hiking and crochet. Relevant past medical history and comorbidities include diabetes with peripheral neuropathy, sleep apnea, history of uterine cancer (followed by radiologist, OBGYN, and oncologist), diverticulosis, bilateral TKA, right rotator cuff repair, Barret's Esophagus, history of chronic lumbar and left hip pain (no spinal surgery), asthma and bronchitis, hx of neck pain.  Patient denies hx of stroke, seizures, lung problem (aside from above), major cardiac events, unexplained weight loss, changes in bowel or bladder problems associated with onset of chief complaint, new onset stumbling or dropping things except for right hand R hand is stiff (denies paresthesia).    Limitations Sitting;House hold activities;Other (comment);Walking   causes difficulty completing usual daily activities and pain seems proportional to the amount of activity she does each day. Usual activities include ADLs, IADLs, exercise at least 3x per week, spending time w/family, housework, hobbies such as hiking.   Diagnostic  tests HIp x-ray 09/2018: "IMPRESSION:There is no acute fracture or dislocation of the left hip. There ismild degenerative joint space loss. Of note is degenerative changeof the SI joints and pubic symphysis."    Patient Stated Goals "that it won't go any further"    Currently in Pain? No/denies    Pain Onset More than a month ago            OBJECTIVE:  FOTO = 99  TREATMENT:  Therapeutic exercise:to centralize symptoms and improve ROM, strength, muscular endurance, and activity tolerance required for successful completion of functional activities.  - seated figure 4 position ankle inversion against single yellow theraband around forefoot on each side 1x20 total each side. Able to set up independently. Required cuing for improved form on left side especially.  - runner's step up to 6 inch step at base of stairs with touch down UE support and attempt at balance at top of step.3x20 each side.Supervisionfor safety.Cuing to improve hold time and ROM. Used metronome settling on 36 bpm with 3 beats hold and one beat to step down. Added beneficial cognitive challenge   -Education on HEP  Neuromuscular Re-education:to improve, balance, postural strength, muscle activation patterns, and stabilization strength required for functional activities:  Standing on firm surface with treadmill bars in front for safety - horizontal head turns, tandem stance 1x20 each foot front - vertical head nods,  tandem stance 1x20 each foot front - eyes closed, tandem stance, 1x60seconds each foot front  Standing on airex complient surface with treadmill bars in front for safety - horizontal head turns, tandem stance 1x20 each foot front - vertical head nods,  tandem stance 1x20 each foot front  HOME EXERCISE PROGRAM Access Code: [email or web address] URL: [email or web address] Date: 03/10/2020 Prepared by: Rosita Kea  Exercises Runner's Climb - 3 x weekly - 3 sets - 15 reps - 3  seconds hold Diagonal Hip Extension with Resistance - 3 x weekly - 3 sets - 10 reps - 4 seconds hold Tandem Stance with Head Rotation - 3 x weekly - 2 sets - 20 reps Tandem Stance with Head Nods - 3 x weekly - 2 sets - 20 reps Tandem Stance with Eyes Closed - 3 x weekly - 2 reps - 1 minute hold Seated Figure 4 Ankle Inversion with Resistance - 3 x weekly - 2 sets - 20 reps - 1 second hold Seated Lesser Toes Extension - 3 x weekly - 1 sets - 20 reps - 2 seconds hold Seated Great Toe Extension - 3 x weekly - 1 sets - 20 reps - 2 seconds hold Toe Spreading - 3 x weekly - 1 sets - 20 reps - 2 seconds hold     PT Education - 03/12/20 0919    Education Details Exercise purpose/form. Self management techniques    Person(s) Educated Patient    Methods Explanation;Demonstration;Tactile cues;Verbal cues    Comprehension Verbalized understanding;Returned demonstration;Verbal cues required;Tactile cues required;Need further instruction            PT Short Term Goals - 03/12/20 0933      PT SHORT TERM GOAL #1   Title Be independent with initial home exercise program for self-management of symptoms.    Baseline iniital HEP provided at IE (01/28/2020);    Time 2    Period Weeks    Status Achieved    Target Date 02/11/20             PT Long Term Goals - 03/12/20 1043      PT LONG TERM GOAL #1   Title Be independent with a long-term home exercise program for self-management of symptoms.     Baseline iniital HEP provided at IE (01/28/2020);    Time 12    Period Weeks    Status Achieved    Target Date --   TARGET DATE FOR ALL LONG TERM GOALS: 04/21/2020     PT LONG TERM GOAL #2   Title Reduce pain with functional activities to equal or less than 1/10 to allow patient to complete usual activities including ADLs, IADLs, and social engagement with less difficulty.    Baseline up to 8/10 (01/28/2020);    Time 12    Period Weeks    Status Achieved      PT LONG TERM GOAL #3   Title Patient  will demonstrate B LE strength 5/5 to demonstrate functional strength for independent gait, increased standing tolerance, lifting, carrying and increased ADL ability.    Baseline see objective exam (01/28/2020); did not re-measure as patient expressed confidence of discharge ability at visit 9 at the end of session (03/12/2020);    Time 12  Period Weeks    Status Unable to assess      PT LONG TERM GOAL #4   Title Complete community, work and/or recreational activities without limitation due to current condition.     Baseline limiting ability to complete completing usual daily activities and pain seems proportional to the amount of activity she does each day, includes ADLs, IADLs, exercise at least 3x per week, spending time with family, keeping up her home, relaxing, and engaging in hobbies such as hiking and crochet without difficulty (01/28/2020);    Time 12    Period Weeks    Status Achieved                 Plan - 03/12/20 1051    Clinical Impression Statement Patient tolerated treatment well overall and is showing good participation and ability to perform HEP independently with good form. Patient has attended 9 physical therapy treatment sessions this episode of care and has made excellent progress towards her goals. Patient reports good understanding of fall reduction strategies while hiking (only envirmonment where she had a fall in the last  6 months). Patient is now discharged from physical therapy due to meeting goals and becoming independent with long term HEP.    Personal Factors and Comorbidities Age;Comorbidity 3+;Sex;Time since onset of injury/illness/exacerbation;Past/Current Experience    Comorbidities Relevant past medical history and comorbidities include diabetes with peripheral neuropathy, sleep apnea, history of uterine cancer (followed by radiologist, OBGYN, and oncologist), diverticulosis, bilateral TKA, right rotator cuff repair, Barret's Esophagus, history of chronic  lumbar and left hip pain (no spinal surgery), asthma and bronchitis, hx of neck pain.    Examination-Activity Limitations Squat;Stairs;Other   causes difficulty completing usual daily activities and pain seems proportional to the amount of activity she does each day, includes ADLs, IADLs, exercise at least 3x per week, spending time with family, keeping up her home,   Examination-Participation Restrictions Community Activity;Yard Work;Cleaning;Shop;Laundry;Interpersonal Relationship;Other   hiking, crochet   Stability/Clinical Decision Making Evolving/Moderate complexity    Rehab Potential Fair    PT Frequency 2x / week    PT Duration 12 weeks   up to 12 weeks as needed   PT Treatment/Interventions ADLs/Self Care Home Management;Biofeedback;Cryotherapy;Moist Heat;Therapeutic activities;Therapeutic exercise;Neuromuscular re-education;Patient/family education;Manual techniques;Passive range of motion;Dry needling;Spinal Manipulations;Joint Manipulations    PT Next Visit Plan Patient is discharged from PT due to meeting goals.    PT Home Exercise Plan Medbridge Access Code: [email or web address], [email or web address] hip flexor stretch    Consulted and Agree with Plan of Care Patient           Patient will benefit from skilled therapeutic intervention in order to improve the following deficits and impairments:  Pain, Decreased activity tolerance, Decreased range of motion, Impaired perceived functional ability, Obesity, Increased edema, Decreased strength, Hypomobility, Increased muscle spasms, Decreased endurance  Visit Diagnosis: Pain in left thigh  Unspecified disturbances of skin sensation  Muscle weakness (generalized)     Problem List Patient Active Problem List   Diagnosis Date Noted  . Left wrist sprain, sequela 02/24/2020  . Right hand pain 02/24/2020  . Bronchitis 01/22/2020  . Hepatic steatosis 07/18/2019  . Precordial pain 07/03/2019  . Epigastric pain 07/03/2019  . Vertigo, labyrinthine,  unspecified laterality 03/30/2019  . Chronic hip pain, left 09/29/2018  . Pain of right tibia 09/29/2018  . Hyperlipidemia associated with type 2 diabetes mellitus ([place]) 03/29/2018  . Bilateral pes planus 03/29/2018  . SUI (stress urinary incontinence, female) 11/23/2017  . Vaginal atrophy 11/23/2017  .  Obesity (BMI 30.0-34.9) 06/20/2017  . Carpal tunnel syndrome on right 06/20/2017  . Endometrioid adenocarcinoma of uterus ([place]) 01/19/2017  . OSA on CPAP 11/24/2016  . Cyst of ovary 11/16/2016  . Cervical stenosis (uterine cervix) 11/16/2016  . CKD (chronic kidney disease) stage 3, GFR 30-59 ml/min 11/07/2014  . GERD (gastroesophageal reflux disease) 12/26/2013  . Irritable bowel syndrome with constipation 12/26/2013  . Encounter for preventive health examination 10/21/2013  . Hyperlipidemia LDL goal <100 07/15/2013  . Chronic venous insufficiency 03/21/2013  . DM (diabetes mellitus), type 2 with renal complications ([place]) 61/95/0932  . Lack of libido 11/05/2012  . Barrett's esophagus 11/05/2012  . Hiatal hernia 11/05/2012  . Essential hypertension 11/05/2012    Everlean Alstrom. Graylon Good, PT, DPT 03/12/20, 10:53 AM  [facility] [address] [place], [address] Phone: [phone]   Fax:  [phone]  Name: Sandra Hendrix MRN: [id number] Date of Birth: 03-02-1946

## 2020-03-17 ENCOUNTER — Ambulatory Visit: Payer: Medicare PPO | Admitting: Physical Therapy

## 2020-03-17 ENCOUNTER — Encounter: Payer: Medicare PPO | Admitting: Physical Therapy

## 2020-03-19 ENCOUNTER — Ambulatory Visit: Payer: Medicare PPO | Admitting: Physical Therapy

## 2020-03-24 ENCOUNTER — Encounter: Payer: Medicare PPO | Admitting: Physical Therapy

## 2020-04-13 ENCOUNTER — Ambulatory Visit: Payer: Medicare PPO | Admitting: Urology

## 2020-04-16 ENCOUNTER — Other Ambulatory Visit: Payer: Self-pay | Admitting: Internal Medicine

## 2020-04-20 ENCOUNTER — Encounter: Payer: Self-pay | Admitting: Urology

## 2020-04-20 ENCOUNTER — Ambulatory Visit (INDEPENDENT_AMBULATORY_CARE_PROVIDER_SITE_OTHER): Payer: Medicare PPO | Admitting: Urology

## 2020-04-20 ENCOUNTER — Other Ambulatory Visit: Payer: Self-pay

## 2020-04-20 VITALS — BP 151/82 | HR 76 | Ht 61.0 in | Wt 179.8 lb

## 2020-04-20 DIAGNOSIS — N3946 Mixed incontinence: Secondary | ICD-10-CM | POA: Diagnosis not present

## 2020-04-20 MED ORDER — OXYBUTYNIN CHLORIDE ER 10 MG PO TB24: 10.0000 mg | ORAL_TABLET | Freq: Every day | ORAL | 3 refills | Status: DC

## 2020-04-20 MED ORDER — METFORMIN HCL 500 MG PO TABS: 500.0000 mg | ORAL_TABLET | Freq: Two times a day (BID) | ORAL | 2 refills | Status: DC

## 2020-04-20 NOTE — Progress Notes (Signed)
04/20/2020 3:26 PM   Sandra Hendrix [birth year] [id number]  Referring provider: Crecencio Mc, MD [address] [place],  [place] [address]  Chief Complaint  Patient presents with  . Follow-up    HPI: I was consulted to assess the patient's urinary incontinence worsening over many months. She leaks with coughing sneezing and with bending and lifting if its heavy. She can leak with walking  She has urge incontinence especially if she holds it long enough.This is her most bothersome symptom in the morning. No bedwetting. 2 pads a day moderately wet to soaked   Grade 2 hypermobility the bladder neck and negative cough test.   During urodynamics she emptied well after uroflow. Maximum bladder capacity 504 mL. Low pressure bladder overactivity felt as urgency but did not leak. She said she likely would have been leaking as she was standing. No stress incontinence with Valsalva pressure of 124 cm of water. During voluntary voiding she voided 465 mL with a maximum of 12 mils per second. Maximum voiding pressure 12 cm water. Residual 30 mL. EMG activity increased intermittently throughout voiding bladder neck descended 2 to 3 cm. She had a depressed uroflow pattern. She also voided with a interrupted pattern.    Patient is mixed incontinence. She has very mild stress incontinence. I will treat her with medical behavioral therapy. If she does not reach her treatment goal I will have to reestablish her goals regarding stress incontinence. A sling or urethral injectable are reasonable as it is a third line OAB therapy  Patient was a partial responder to Myrbetriq so was given oxybutynin and Vesicare  Incontinence 80% better on oxy.  She said her flow was okay but at the end she got to lean forward and strain to try to empty and would double void a modest amount.  Was a bit bothersome.  She failed Vesicare.    Just a by a family member or friend she want  to try trospium 60 mg and I will see her back in  6 weeks.  We can always go back to oxybutynin or discuss other treatments.  This suggested perhaps a third line therapy may be reasonable and not a stress incontinence treatment  Today Frequency stable Trospium minimal benefit.  Clinically not infected.  Urge incontinence persisted and she went back on oxybutynin.  She is very pleased on it       PMH: Past Medical History:  Diagnosis Date  . Allergy   . Anxiety   . Arthritis   . Barrett's esophagus determined by endoscopy   . Cancer [facility])    Uterine Cancer  . Chronic kidney disease   . Depression   . Diabetes mellitus without complication ([place])   . Difficult intubation    "not sure what happened,asleep was told had problem with intubation"  . Diverticulosis   . Fatty liver   . GERD (gastroesophageal reflux disease)   . History of bronchitis   . History of hiatal hernia   . Hyperlipidemia   . Hypertension   . Sleep apnea    Use C- PAP  . Urinary incontinence   . Varicose veins     Surgical History: Past Surgical History:  Procedure Laterality Date  . CARDIAC CATHETERIZATION    . CHOLECYSTECTOMY    . COLONOSCOPY WITH PROPOFOL N/A 06/14/2016   Procedure: COLONOSCOPY WITH PROPOFOL;  Surgeon: Lollie Sails, MD;  Location: [facility] ENDOSCOPY;  Service: Endoscopy;  Laterality: N/A;  . ESOPHAGOGASTRODUODENOSCOPY (EGD)  WITH PROPOFOL N/A 06/14/2016   Procedure: ESOPHAGOGASTRODUODENOSCOPY (EGD) WITH PROPOFOL;  Surgeon: Lollie Sails, MD;  Location: [facility] ENDOSCOPY;  Service: Endoscopy;  Laterality: N/A;  . ESOPHAGOGASTRODUODENOSCOPY (EGD) WITH PROPOFOL N/A 06/12/2017   Procedure: ESOPHAGOGASTRODUODENOSCOPY (EGD) WITH PROPOFOL;  Surgeon: Lollie Sails, MD;  Location: [facility] ENDOSCOPY;  Service: Endoscopy;  Laterality: N/A;  . ESOPHAGOGASTRODUODENOSCOPY (EGD) WITH PROPOFOL N/A 08/25/2017   Procedure: ESOPHAGOGASTRODUODENOSCOPY (EGD) WITH PROPOFOL;  Surgeon: Lollie Sails,  MD;  Location: [facility] ENDOSCOPY;  Service: Endoscopy;  Laterality: N/A;  . HYSTEROSCOPY WITH D & C N/A 01/02/2017   Procedure: DILATATION AND CURETTAGE /HYSTEROSCOPY;  Surgeon: Brayton Mars, MD;  Location: [facility] ORS;  Service: Gynecology;  Laterality: N/A;  . JOINT REPLACEMENT Right 2007   Total Knee Replacement  . JOINT REPLACEMENT Left 2004   Total Knee Replacement  . LAPAROSCOPIC HYSTERECTOMY Bilateral 03/01/2017   Procedure: HYSTERECTOMY TOTAL LAPAROSCOPIC BSO;  Surgeon: Mellody Drown, MD;  Location: [facility] ORS;  Service: Gynecology;  Laterality: Bilateral;  . ROTATOR CUFF REPAIR Right   . SENTINEL NODE BIOPSY N/A 03/01/2017   Procedure: SENTINEL NODE INJECTION AND BIOPSY;  Surgeon: Mellody Drown, MD;  Location: [facility] ORS;  Service: Gynecology;  Laterality: N/A;  . TONSILLECTOMY     as a child  . TUBAL LIGATION    . UPPER GI ENDOSCOPY     x2    Home Medications:  Allergies as of 04/20/2020      Reactions   Clindamycin/lincomycin Rash   Codeine Rash   Lincomycin Rash   Morphine And Related Nausea Only   Talwin [pentazocine] Nausea And Vomiting   Lisinopril Cough   cough   Naloxone Other (See Comments)   Had issues with sedation and vomiting with EGD   Oxycodone-acetaminophen Other (See Comments)   Pt states this medication makes her bowels stop moving      Medication List       Accurate as of April 20, 2020  3:26 PM. If you have any questions, ask your nurse or doctor.        Accu-Chek Aviva Plus test strip Generic drug: glucose blood TEST TWO TIMES DAILY   accu-chek softclix lancets Use to test blood sugars 1 -2 times daily   acetaminophen 500 MG tablet Commonly known as: TYLENOL Take 2 tablets (1,000 mg total) by mouth every 6 (six) hours. What changed:   when to take this  reasons to take this   albuterol 108 (90 Base) MCG/ACT inhaler Commonly known as: VENTOLIN HFA Inhale 2 puffs into the lungs every 6 (six) hours as needed for wheezing or shortness  of breath.   amLODipine 5 MG tablet Commonly known as: NORVASC Take by mouth.   Ascorbic Acid 500 MG Chew Chew by mouth.   aspirin EC 81 MG tablet Take 81 mg by mouth every evening.   atorvastatin 40 MG tablet Commonly known as: LIPITOR Take 0.5 tablets (20 mg total) by mouth daily at 6 PM.   azelastine 0.1 % nasal spray Commonly known as: ASTELIN Place 1 spray into both nostrils daily.   benzonatate 200 MG capsule Commonly known as: TESSALON   Biotin 5000 MCG Tabs Take by mouth 2 (two) times daily.   blood glucose meter kit and supplies Use to check blood sugar twice daily. ICD 10: E11.29   Cetirizine HCl 10 MG Caps Take by mouth.   Cinnamon Bark Powd Take by mouth.   COLLAGEN PO Take by mouth 2 (two) times daily. For improved fingernail  strength   docusate sodium 100 MG capsule Commonly known as: Colace Take 1 capsule (100 mg total) by mouth 2 (two) times daily.   fish oil-omega-3 fatty acids 1000 MG capsule Take 1 g by mouth every evening.   fluticasone 50 MCG/ACT nasal spray Commonly known as: FLONASE Place 1 spray into both nostrils daily.   furosemide 20 MG tablet Commonly known as: LASIX Take 1 tablet (20 mg total) by mouth every other day.   glipiZIDE 5 MG 24 hr tablet Commonly known as: GLUCOTROL XL Take 1 tablet (5 mg total) by mouth daily.   Januvia 100 MG tablet Generic drug: sitaGLIPtin TAKE 1 TABLET BY MOUTH  DAILY   Levemir 100 UNIT/ML injection Generic drug: insulin detemir INJECT 20 UNITS SUBCUTANEOUSLY AT BEDTIME   loratadine 10 MG tablet Commonly known as: CLARITIN Take 10 mg by mouth daily.   losartan-hydrochlorothiazide 50-12.5 MG tablet Commonly known as: HYZAAR TAKE 1 TABLET BY MOUTH  DAILY   Magnesium 250 MG Tabs Take 1 tablet by mouth daily.   meclizine 25 MG tablet Commonly known as: ANTIVERT Take 1 tablet (25 mg total) by mouth 3 (three) times daily as needed for dizziness.   metFORMIN 500 MG tablet Commonly  known as: GLUCOPHAGE Take 1 tablet (500 mg total) by mouth 2 (two) times daily with a meal.   multivitamin tablet Take 1 tablet by mouth daily.   nitroGLYCERIN 0.4 MG SL tablet Commonly known as: NITROSTAT Place 1 tablet (0.4 mg total) under the tongue every 5 (five) minutes as needed for chest pain.   NON FORMULARY medroxy progesterone one 66m pill once daily   oxybutynin 10 MG 24 hr tablet Commonly known as: DITROPAN-XL Take 1 tablet (10 mg total) by mouth daily.   pantoprazole 40 MG tablet Commonly known as: PROTONIX Take 1 tablet by mouth twice daily   potassium chloride SA 20 MEQ tablet Commonly known as: KLOR-CON TAKE 1 TABLET BY MOUTH EVERY OTHER DAY   Probiotic-10 Caps Take 1 capsule by mouth daily.   sertraline 50 MG tablet Commonly known as: ZOLOFT Take 1 tablet (50 mg total) by mouth daily.   tiZANidine 4 MG tablet Commonly known as: ZANAFLEX tizanidine 4 mg tablet   trospium 20 MG tablet Commonly known as: SANCTURA Take 1 tablet (20 mg total) by mouth 2 (two) times daily.   verapamil 180 MG 24 hr capsule Commonly known as: VERELAN PM Take 1 capsule (180 mg total) by mouth daily.   vitamin B-12 1000 MCG tablet Commonly known as: CYANOCOBALAMIN Take 1,000 mcg by mouth daily.   Zinc 50 MG Tabs Take by mouth daily.       Allergies:  Allergies  Allergen Reactions  . Clindamycin/Lincomycin Rash  . Codeine Rash  . Lincomycin Rash  . Morphine And Related Nausea Only  . Talwin [Pentazocine] Nausea And Vomiting  . Lisinopril Cough    cough  . Naloxone Other (See Comments)    Had issues with sedation and vomiting with EGD  . Oxycodone-Acetaminophen Other (See Comments)    Pt states this medication makes her bowels stop moving    Family History: Family History  Problem Relation Age of Onset  . Stroke Mother   . Hypertension Mother   . Lupus Mother   . Alcohol abuse Father   . Diabetes Father   . Cancer Father        gallbladder ca into  liver   . Arthritis Sister   . Diabetes Sister   .  Lumbar disc disease Sister   . Hyperlipidemia Daughter   . Hypertension Daughter   . Cancer Maternal Aunt        ovarian ca  . Diabetes Paternal Aunt   . Drug abuse Paternal Aunt   . Cancer Maternal Grandfather        colon ca  . Hypertension Brother   . Alcohol abuse Brother   . Stroke Brother     Social History:  reports that she has never smoked. She has never used smokeless tobacco. She reports previous alcohol use. She reports that she does not use drugs.  ROS:                                        Physical Exam:   Laboratory Data: Lab Results  Component Value Date   WBC 4.2 05/15/2017   HGB 12.0 05/15/2017   HCT 34.5 (L) 05/15/2017   MCV 88.0 05/15/2017   PLT 316 05/15/2017    Lab Results  Component Value Date   CREATININE 1.10 01/14/2020    No results found for: PSA  No results found for: TESTOSTERONE  Lab Results  Component Value Date   HGBA1C 7.1 (H) 01/14/2020    Urinalysis    Component Value Date/Time   COLORURINE YELLOW 10/05/2016 1003   APPEARANCEUR Clear 09/30/2019 1257   LABSPEC 1.010 10/05/2016 1003   PHURINE 6.0 10/05/2016 1003   GLUCOSEU Negative 09/30/2019 1257   GLUCOSEU NEGATIVE 10/05/2016 1003   HGBUR NEGATIVE 10/05/2016 1003   BILIRUBINUR Negative 09/30/2019 1257   KETONESUR NEGATIVE 10/05/2016 1003   PROTEINUR Negative 09/30/2019 1257   PROTEINUR 30 (A) 02/20/2015 2137   UROBILINOGEN 0.2 05/26/2017 1122   UROBILINOGEN 0.2 10/05/2016 1003   NITRITE Negative 09/30/2019 1257   NITRITE NEGATIVE 10/05/2016 1003   LEUKOCYTESUR Negative 09/30/2019 1257    Pertinent Imaging:   Assessment & Plan: Oxybutynin 90 mg x 3 sent to pharmacy and see in 1 year  There are no diagnoses linked to this encounter.  No follow-ups on file.  Reece Packer, MD  [facility] [address], [place], [place] [address] [phone]

## 2020-04-20 NOTE — Addendum Note (Signed)
Addended by: Alvera Novel on: 04/20/2020 03:43 PM   Modules accepted: Orders

## 2020-04-20 NOTE — Addendum Note (Signed)
Addended by: Alvera Novel on: 04/20/2020 03:39 PM   Modules accepted: Orders

## 2020-04-20 NOTE — Addendum Note (Signed)
Addended by: Adair Laundry on: 04/20/2020 12:49 PM   Modules accepted: Orders

## 2020-04-24 DIAGNOSIS — E1140 Type 2 diabetes mellitus with diabetic neuropathy, unspecified: Secondary | ICD-10-CM | POA: Diagnosis not present

## 2020-04-24 DIAGNOSIS — I10 Essential (primary) hypertension: Secondary | ICD-10-CM | POA: Diagnosis not present

## 2020-04-24 DIAGNOSIS — N183 Chronic kidney disease, stage 3 unspecified: Secondary | ICD-10-CM | POA: Diagnosis not present

## 2020-04-24 DIAGNOSIS — R600 Localized edema: Secondary | ICD-10-CM | POA: Diagnosis not present

## 2020-04-24 DIAGNOSIS — N1831 Chronic kidney disease, stage 3a: Secondary | ICD-10-CM | POA: Diagnosis not present

## 2020-04-24 DIAGNOSIS — R809 Proteinuria, unspecified: Secondary | ICD-10-CM | POA: Diagnosis not present

## 2020-04-24 DIAGNOSIS — E1165 Type 2 diabetes mellitus with hyperglycemia: Secondary | ICD-10-CM | POA: Diagnosis not present

## 2020-04-24 DIAGNOSIS — E1122 Type 2 diabetes mellitus with diabetic chronic kidney disease: Secondary | ICD-10-CM | POA: Diagnosis not present

## 2020-04-27 ENCOUNTER — Other Ambulatory Visit: Payer: Self-pay | Admitting: Internal Medicine

## 2020-04-30 DIAGNOSIS — J069 Acute upper respiratory infection, unspecified: Secondary | ICD-10-CM | POA: Diagnosis not present

## 2020-04-30 DIAGNOSIS — Z20822 Contact with and (suspected) exposure to covid-19: Secondary | ICD-10-CM | POA: Diagnosis not present

## 2020-05-15 ENCOUNTER — Other Ambulatory Visit (INDEPENDENT_AMBULATORY_CARE_PROVIDER_SITE_OTHER): Payer: Medicare PPO

## 2020-05-15 ENCOUNTER — Other Ambulatory Visit: Payer: Self-pay

## 2020-05-15 DIAGNOSIS — N1831 Chronic kidney disease, stage 3a: Secondary | ICD-10-CM | POA: Diagnosis not present

## 2020-05-15 DIAGNOSIS — Z794 Long term (current) use of insulin: Secondary | ICD-10-CM | POA: Diagnosis not present

## 2020-05-15 DIAGNOSIS — E1121 Type 2 diabetes mellitus with diabetic nephropathy: Secondary | ICD-10-CM

## 2020-05-15 LAB — COMPREHENSIVE METABOLIC PANEL
ALT: 18 U/L (ref 0–35)
AST: 20 U/L (ref 0–37)
Albumin: 4.4 g/dL (ref 3.5–5.2)
Alkaline Phosphatase: 108 U/L (ref 39–117)
BUN: 20 mg/dL (ref 6–23)
CO2: 28 mEq/L (ref 19–32)
Calcium: 9.8 mg/dL (ref 8.4–10.5)
Chloride: 104 mEq/L (ref 96–112)
Creatinine, Ser: 0.98 mg/dL (ref 0.40–1.20)
GFR: 55.16 mL/min — ABNORMAL LOW (ref >60.00)
Glucose, Bld: 105 mg/dL — ABNORMAL HIGH (ref 70–99)
Potassium: 3.7 mEq/L (ref 3.5–5.1)
Sodium: 141 mEq/L (ref 135–145)
Total Bilirubin: 0.5 mg/dL (ref 0.2–1.2)
Total Protein: 6.9 g/dL (ref 6.0–8.3)

## 2020-05-15 LAB — HEMOGLOBIN A1C: Hgb A1c MFr Bld: 7.3 % — ABNORMAL HIGH (ref 4.6–6.5)

## 2020-05-19 ENCOUNTER — Other Ambulatory Visit: Payer: Self-pay

## 2020-05-19 ENCOUNTER — Ambulatory Visit: Payer: Medicare PPO | Admitting: Internal Medicine

## 2020-05-19 ENCOUNTER — Encounter: Payer: Self-pay | Admitting: Internal Medicine

## 2020-05-19 DIAGNOSIS — I10 Essential (primary) hypertension: Secondary | ICD-10-CM | POA: Diagnosis not present

## 2020-05-19 DIAGNOSIS — K760 Fatty (change of) liver, not elsewhere classified: Secondary | ICD-10-CM

## 2020-05-19 DIAGNOSIS — Z794 Long term (current) use of insulin: Secondary | ICD-10-CM

## 2020-05-19 DIAGNOSIS — E1121 Type 2 diabetes mellitus with diabetic nephropathy: Secondary | ICD-10-CM

## 2020-05-19 DIAGNOSIS — N1831 Chronic kidney disease, stage 3a: Secondary | ICD-10-CM

## 2020-05-19 NOTE — Assessment & Plan Note (Signed)
Presumed by ultrasound changes and serologies negative for autoimmune causes of hepatitis.  Current liver enzymes are normal and all modifiable risk factors including obesity, diabetes and hyperlipidemia have been addressed .  Lab Results  Component Value Date   ALT 18 05/15/2020   AST 20 05/15/2020   ALKPHOS 108 05/15/2020   BILITOT 0.5 05/15/2020

## 2020-05-19 NOTE — Assessment & Plan Note (Signed)
Well controlled on current regimen. Renal function stable, no changes today. 

## 2020-05-19 NOTE — Progress Notes (Signed)
Subjective:  Patient ID: Sandra Hendrix, female    DOB: [birth year]  Age: 74 y.o. MRN: [id number]  CC: Diagnoses of Type 2 diabetes mellitus with diabetic nephropathy, with long-term current use of insulin ([place]), Stage 3a chronic kidney disease, Hepatic steatosis, and Essential hypertension were pertinent to this visit.  [name] presents for follow up on type 2 DM  This visit occurred during the SARS-CoV-2 public health emergency.  Safety protocols were in place, including screening questions prior to the visit, additional usage of staff PPE, and extensive cleaning of exam room while observing appropriate contact time as indicated for disinfecting solutions.    Patient has received both doses of the available COVID 19 vaccine without complications.  Patient continues to mask when outside of the home except when walking in yard or at safe distances from others .  Patient denies any change in mood or development of unhealthy behaviors resuting from the pandemic's restriction of activities and socialization.     3 month follow up on Type 2 DM, recent diagnosis,  Hypertension, hyperlipidemia and obesity.  working out 3 times per week at [employer]. And enjoying it.  Blood sugars have been a bit labiel with a few lows,  Complicated by her  Late night schedule (stays up until 3 watching TV) . No numbness or tingling.  Tolerating medications.   Hypertension: patient checks blood pressure twice weekly at home.  Readings have been for the most part < 140/80 at rest . Patient is following a reduce salt diet most days and is taking medications as prescribed    Outpatient Medications Prior to Visit  Medication Sig Dispense Refill  . ACCU-CHEK AVIVA PLUS test strip TEST TWO TIMES DAILY 200 each 0  . acetaminophen (TYLENOL) 500 MG tablet Take 2 tablets (1,000 mg total) by mouth every 6 (six) hours. (Patient taking differently: Take 1,000 mg every 6 (six) hours as needed by mouth. ) 65 tablet  1  . albuterol (PROVENTIL HFA;VENTOLIN HFA) 108 (90 BASE) MCG/ACT inhaler Inhale 2 puffs into the lungs every 6 (six) hours as needed for wheezing or shortness of breath.     . Ascorbic Acid 500 MG CHEW Chew by mouth.    Marland Kitchen aspirin EC 81 MG tablet Take 81 mg by mouth every evening.     Marland Kitchen atorvastatin (LIPITOR) 40 MG tablet Take 0.5 tablets (20 mg total) by mouth daily at 6 PM. 90 tablet 3  . azelastine (ASTELIN) 0.1 % nasal spray Place 1 spray into both nostrils daily.    . benzonatate (TESSALON) 200 MG capsule     . Biotin 5000 MCG TABS Take by mouth 2 (two) times daily.     . blood glucose meter kit and supplies Use to check blood sugar twice daily. ICD 10: E11.29 1 each 0  . Cetirizine HCl 10 MG CAPS Take by mouth.    Verneita Griffes Bark POWD Take by mouth.    . COLLAGEN PO Take by mouth 2 (two) times daily. For improved fingernail strength    . docusate sodium (COLACE) 100 MG capsule Take 1 capsule (100 mg total) by mouth 2 (two) times daily. 20 capsule 0  . fish oil-omega-3 fatty acids 1000 MG capsule Take 1 g by mouth every evening.     . fluticasone (FLONASE) 50 MCG/ACT nasal spray Place 1 spray into both nostrils daily. 48 g 1  . furosemide (LASIX) 20 MG tablet Take 1 tablet (20 mg total) by mouth  every other day. 45 tablet 3  . JANUVIA 100 MG tablet TAKE 1 TABLET BY MOUTH  DAILY 90 tablet 3  . Lancet Devices (ACCU-CHEK SOFTCLIX) lancets Use to test blood sugars 1 -2 times daily 1 each 3  . LEVEMIR 100 UNIT/ML injection INJECT 20 UNITS SUBCUTANEOUSLY AT BEDTIME 10 mL 0  . loratadine (CLARITIN) 10 MG tablet Take 10 mg by mouth daily.    Marland Kitchen losartan-hydrochlorothiazide (HYZAAR) 50-12.5 MG tablet TAKE 1 TABLET BY MOUTH  DAILY 90 tablet 3  . Magnesium 250 MG TABS Take 1 tablet by mouth daily.    . meclizine (ANTIVERT) 25 MG tablet Take 1 tablet (25 mg total) by mouth 3 (three) times daily as needed for dizziness. 30 tablet 3  . metFORMIN (GLUCOPHAGE) 500 MG tablet Take 1 tablet (500 mg total) by  mouth 2 (two) times daily with a meal. 180 tablet 2  . Multiple Vitamin (MULTIVITAMIN) tablet Take 1 tablet by mouth daily.    . nitroGLYCERIN (NITROSTAT) 0.4 MG SL tablet Place 1 tablet (0.4 mg total) under the tongue every 5 (five) minutes as needed for chest pain. 50 tablet 3  . NON FORMULARY medroxy progesterone one 64m pill once daily    . oxybutynin (DITROPAN-XL) 10 MG 24 hr tablet Take 1 tablet (10 mg total) by mouth daily. 90 tablet 3  . pantoprazole (PROTONIX) 40 MG tablet Take 1 tablet by mouth twice daily 180 tablet 3  . potassium chloride SA (KLOR-CON) 20 MEQ tablet TAKE 1 TABLET BY MOUTH EVERY OTHER DAY 45 tablet 0  . Probiotic Product (PROBIOTIC-10) CAPS Take 1 capsule by mouth daily.     . sertraline (ZOLOFT) 50 MG tablet Take 1 tablet (50 mg total) by mouth daily. 90 tablet 3  . tiZANidine (ZANAFLEX) 4 MG tablet tizanidine 4 mg tablet    . trospium (SANCTURA) 20 MG tablet Take 1 tablet (20 mg total) by mouth 2 (two) times daily. 30 tablet 11  . verapamil (VERELAN PM) 180 MG 24 hr capsule Take 1 capsule (180 mg total) by mouth daily. 90 capsule 3  . vitamin B-12 (CYANOCOBALAMIN) 1000 MCG tablet Take 1,000 mcg by mouth daily.    . Zinc 50 MG TABS Take by mouth daily.    .Marland KitchenglipiZIDE (GLUCOTROL XL) 5 MG 24 hr tablet Take 1 tablet (5 mg total) by mouth daily. 90 tablet 3  . mirabegron ER (MYRBETRIQ) 50 MG TB24 tablet Myrbetriq 50 mg tablet,extended release  TAKE 1 TABLET BY MOUTH ONCE DAILY (Patient not taking: Reported on 05/19/2020)    . predniSONE (DELTASONE) 10 MG tablet prednisone 10 mg tablet  TAKE 1 TABLET BY MOUTH ONCE DAILY FOR 5 DAYS (Patient not taking: Reported on 04/20/2020)    . solifenacin (VESICARE) 5 MG tablet solifenacin 5 mg tablet  TAKE 1 TABLET BY MOUTH ONCE DAILY (Patient not taking: Reported on 05/19/2020)     No facility-administered medications prior to visit.    Review of Systems;  Patient denies headache, fevers, malaise, unintentional weight loss, skin  rash, eye pain, sinus congestion and sinus pain, sore throat, dysphagia,  hemoptysis , cough, dyspnea, wheezing, chest pain, palpitations, orthopnea, edema, abdominal pain, nausea, melena, diarrhea, constipation, flank pain, dysuria, hematuria, urinary  Frequency, nocturia, numbness, tingling, seizures,  Focal weakness, Loss of consciousness,  Tremor, insomnia, depression, anxiety, and suicidal ideation.      Objective:  BP 130/74 (BP Location: Left Arm, Patient Position: Sitting, Cuff Size: Normal)   Pulse 75   Temp  98 F (36.7 C) (Oral)   Resp 16   Ht '5\' 1"'  (1.549 m)   Wt 176 lb 6.4 oz (80 kg)   SpO2 96%   BMI 33.33 kg/m   BP Readings from Last 3 Encounters:  05/19/20 130/74  04/20/20 (!) 151/82  03/02/20 (!) 147/76    Wt Readings from Last 3 Encounters:  05/19/20 176 lb 6.4 oz (80 kg)  04/20/20 179 lb 12.8 oz (81.6 kg)  02/25/20 180 lb 1.6 oz (81.7 kg)    General appearance: alert, cooperative and appears stated age Ears: normal TM's and external ear canals both ears Throat: lips, mucosa, and tongue normal; teeth and gums normal Neck: no adenopathy, no carotid bruit, supple, symmetrical, trachea midline and thyroid not enlarged, symmetric, no tenderness/mass/nodules Back: symmetric, no curvature. ROM normal. No CVA tenderness. Lungs: clear to auscultation bilaterally Heart: regular rate and rhythm, S1, S2 normal, no murmur, click, rub or gallop Abdomen: soft, non-tender; bowel sounds normal; no masses,  no organomegaly Pulses: 2+ and symmetric Skin: Skin color, texture, turgor normal. No rashes or lesions Lymph nodes: Cervical, supraclavicular, and axillary nodes normal.  Lab Results  Component Value Date   HGBA1C 7.3 (H) 05/15/2020   HGBA1C 7.1 (H) 01/14/2020   HGBA1C 7.3 (H) 10/11/2019    Lab Results  Component Value Date   CREATININE 0.98 05/15/2020   CREATININE 1.10 01/14/2020   CREATININE 0.98 10/21/2019    Lab Results  Component Value Date   WBC 4.2  05/15/2017   HGB 12.0 05/15/2017   HCT 34.5 (L) 05/15/2017   PLT 316 05/15/2017   GLUCOSE 105 (H) 05/15/2020   CHOL 171 01/14/2020   TRIG 88.0 01/14/2020   HDL 90.50 01/14/2020   LDLDIRECT 80.0 02/22/2017   LDLCALC 63 01/14/2020   ALT 18 05/15/2020   AST 20 05/15/2020   NA 141 05/15/2020   K 3.7 05/15/2020   CL 104 05/15/2020   CREATININE 0.98 05/15/2020   BUN 20 05/15/2020   CO2 28 05/15/2020   TSH 2.78 09/27/2018   INR 1.1 01/23/2009   HGBA1C 7.3 (H) 05/15/2020   MICROALBUR <0.7 01/14/2020    NM Myocar Multi W/Spect W/Wall Motion / EF  Result Date: 07/29/2019  There was no ST segment deviation noted during stress.  No T wave inversion was noted during stress.  The study is normal.  This is a low risk study.  The left ventricular ejection fraction is normal (55-65%).     Assessment & Plan:   Problem List Items Addressed This Visit      Unprioritized   CKD (chronic kidney disease) stage 3, GFR 30-59 ml/min    Stable GFR.  No changes   Lab Results  Component Value Date   CREATININE 0.98 05/15/2020         DM (diabetes mellitus), type 2 with renal complications ([facility]) (Chronic)    Slight loss of control complicated by nocturnal habits causing hypoglycemic events.  Advised to suspend glipizide,  Continue Levemir , metformn and Januvia.  Check sugars fasting and 2 hr post dinner .continue statin as well. Reminded to get eye exam done.  Foot exam done today   Lab Results  Component Value Date   HGBA1C 7.3 (H) 05/15/2020   Lab Results  Component Value Date   LABMICR See below: 09/30/2019   MICROALBUR <0.7 01/14/2020   MICROALBUR 1.3 10/11/2019           Essential hypertension    Well controlled on current regimen.  Renal function stable, no changes today.      Hepatic steatosis    Presumed by ultrasound changes and serologies negative for autoimmune causes of hepatitis.  Current liver enzymes are normal and all modifiable risk factors including obesity,  diabetes and hyperlipidemia have been addressed .  Lab Results  Component Value Date   ALT 18 05/15/2020   AST 20 05/15/2020   ALKPHOS 108 05/15/2020   BILITOT 0.5 05/15/2020             I have discontinued Saphira P. Nemetz's glipiZIDE, mirabegron ER, solifenacin, and predniSONE. I am also having her maintain her multivitamin, fish oil-omega-3 fatty acids, albuterol, aspirin EC, Probiotic-10, loratadine, azelastine, Biotin, acetaminophen, docusate sodium, accu-chek softclix, vitamin B-12, Cinnamon Bark, Magnesium, NON FORMULARY, COLLAGEN PO, fluticasone, meclizine, nitroGLYCERIN, losartan-hydrochlorothiazide, Zinc, Januvia, tiZANidine, Cetirizine HCl, Ascorbic Acid, pantoprazole, Accu-Chek Aviva Plus, blood glucose meter kit and supplies, verapamil, sertraline, furosemide, benzonatate, atorvastatin, trospium, potassium chloride SA, metFORMIN, oxybutynin, and Levemir.  No orders of the defined types were placed in this encounter.   Medications Discontinued During This Encounter  Medication Reason  . mirabegron ER (MYRBETRIQ) 50 MG TB24 tablet   . predniSONE (DELTASONE) 10 MG tablet   . solifenacin (VESICARE) 5 MG tablet   . glipiZIDE (GLUCOTROL XL) 5 MG 24 hr tablet     Follow-up: Return in about 3 months (around 08/19/2020) for follow up diabetes.   Crecencio Mc, MD

## 2020-05-19 NOTE — Assessment & Plan Note (Addendum)
Slight loss of control complicated by nocturnal habits causing hypoglycemic events.  Advised to suspend glipizide,  Continue Levemir , metformn and Januvia.  Check sugars fasting and 2 hr post dinner .continue statin as well. Reminded to get eye exam done.  Foot exam done today   Lab Results  Component Value Date   HGBA1C 7.3 (H) 05/15/2020   Lab Results  Component Value Date   LABMICR See below: 09/30/2019   MICROALBUR <0.7 01/14/2020   MICROALBUR 1.3 10/11/2019

## 2020-05-19 NOTE — Patient Instructions (Addendum)
  Your diabetes is a little less controlled this time.  Suspend the glipizide XL for now and continue taking   levemir 20 units and jardiance   Send me readings in two weeks  Of fastings and 2 hour post prandials (dinner 2 hrs after)

## 2020-05-19 NOTE — Assessment & Plan Note (Signed)
Stable GFR.  No changes   Lab Results  Component Value Date   CREATININE 0.98 05/15/2020

## 2020-06-02 ENCOUNTER — Ambulatory Visit
Admission: EM | Admit: 2020-06-02 | Discharge: 2020-06-02 | Disposition: A | Discharge status: Home / Self Care | Payer: Medicare PPO | Attending: Emergency Medicine | Admitting: Emergency Medicine

## 2020-06-02 DIAGNOSIS — R05 Cough: Secondary | ICD-10-CM | POA: Diagnosis not present

## 2020-06-02 DIAGNOSIS — R059 Cough, unspecified: Secondary | ICD-10-CM

## 2020-06-02 MED ORDER — BENZONATATE 100 MG PO CAPS
100.0000 mg | ORAL_CAPSULE | Freq: Three times a day (TID) | ORAL | 0 refills | Status: DC | PRN
Start: 2020-06-02 — End: 2023-07-17

## 2020-06-02 NOTE — ED Triage Notes (Signed)
Patient reports she got her Moderna booster shot about two weeks ago. Reports the next day she felt really sick, but symptoms improved quickly. States she is here d/t a lingering cough.

## 2020-06-02 NOTE — ED Provider Notes (Signed)
[name]    CSN: [id number] Arrival date & time: 06/02/20  1540      History   Chief Complaint Chief Complaint  Patient presents with  . Cough    [name] is a 74 y.o. female.  Patient present with cough which is occasionally productive of green sputum x 2 weeks.  Her cough started after receiving the 3rd dose of the [place] vaccine. She denies fever, shortness of breath, vomiting, diarrhea, or other symptoms.  Treatment attempted at home with albuterol inhaler. Her medical history includes diabetes, hypertension, CKD, uterine cancer, GERD, OSA, Barrett's esophagus.  The history is provided by the patient.    Past Medical History:  Diagnosis Date  . Allergy   . Anxiety   . Arthritis   . Barrett's esophagus determined by endoscopy   . Cancer [facility])    Uterine Cancer  . Chronic kidney disease   . Depression   . Diabetes mellitus without complication ([place])   . Difficult intubation    "not sure what happened,asleep was told had problem with intubation"  . Diverticulosis   . Fatty liver   . GERD (gastroesophageal reflux disease)   . History of bronchitis   . History of hiatal hernia   . Hyperlipidemia   . Hypertension   . Sleep apnea    Use C- PAP  . Urinary incontinence   . Varicose veins     Patient Active Problem List   Diagnosis Date Noted  . Left wrist sprain, sequela 02/24/2020  . Right hand pain 02/24/2020  . Bronchitis 01/22/2020  . Hepatic steatosis 07/18/2019  . Precordial pain 07/03/2019  . Epigastric pain 07/03/2019  . Vertigo, labyrinthine, unspecified laterality 03/30/2019  . Chronic hip pain, left 09/29/2018  . Pain of right tibia 09/29/2018  . Hyperlipidemia associated with type 2 diabetes mellitus ([place]) 03/29/2018  . Bilateral pes planus 03/29/2018  . SUI (stress urinary incontinence, female) 11/23/2017  . Vaginal atrophy 11/23/2017  . Obesity (BMI 30.0-34.9) 06/20/2017  . Carpal tunnel syndrome on right  06/20/2017  . Endometrioid adenocarcinoma of uterus ([place]) 01/19/2017  . OSA on CPAP 11/24/2016  . Cyst of ovary 11/16/2016  . Cervical stenosis (uterine cervix) 11/16/2016  . CKD (chronic kidney disease) stage 3, GFR 30-59 ml/min 11/07/2014  . GERD (gastroesophageal reflux disease) 12/26/2013  . Irritable bowel syndrome with constipation 12/26/2013  . Encounter for preventive health examination 10/21/2013  . Hyperlipidemia LDL goal <100 07/15/2013  . Chronic venous insufficiency 03/21/2013  . DM (diabetes mellitus), type 2 with renal complications ([place]) 51/10/5850  . Lack of libido 11/05/2012  . Barrett's esophagus 11/05/2012  . Hiatal hernia 11/05/2012  . Essential hypertension 11/05/2012    Past Surgical History:  Procedure Laterality Date  . CARDIAC CATHETERIZATION    . CHOLECYSTECTOMY    . COLONOSCOPY WITH PROPOFOL N/A 06/14/2016   Procedure: COLONOSCOPY WITH PROPOFOL;  Surgeon: Lollie Sails, MD;  Location: [facility] ENDOSCOPY;  Service: Endoscopy;  Laterality: N/A;  . ESOPHAGOGASTRODUODENOSCOPY (EGD) WITH PROPOFOL N/A 06/14/2016   Procedure: ESOPHAGOGASTRODUODENOSCOPY (EGD) WITH PROPOFOL;  Surgeon: Lollie Sails, MD;  Location: [facility] ENDOSCOPY;  Service: Endoscopy;  Laterality: N/A;  . ESOPHAGOGASTRODUODENOSCOPY (EGD) WITH PROPOFOL N/A 06/12/2017   Procedure: ESOPHAGOGASTRODUODENOSCOPY (EGD) WITH PROPOFOL;  Surgeon: Lollie Sails, MD;  Location: [facility] ENDOSCOPY;  Service: Endoscopy;  Laterality: N/A;  . ESOPHAGOGASTRODUODENOSCOPY (EGD) WITH PROPOFOL N/A 08/25/2017   Procedure: ESOPHAGOGASTRODUODENOSCOPY (EGD) WITH PROPOFOL;  Surgeon: Lollie Sails, MD;  Location: [facility] ENDOSCOPY;  Service: Endoscopy;  Laterality: N/A;  . HYSTEROSCOPY WITH D & C N/A 01/02/2017   Procedure: DILATATION AND CURETTAGE /HYSTEROSCOPY;  Surgeon: Brayton Mars, MD;  Location: [facility] ORS;  Service: Gynecology;  Laterality: N/A;  . JOINT REPLACEMENT Right 2007   Total Knee Replacement  . JOINT  REPLACEMENT Left 2004   Total Knee Replacement  . LAPAROSCOPIC HYSTERECTOMY Bilateral 03/01/2017   Procedure: HYSTERECTOMY TOTAL LAPAROSCOPIC BSO;  Surgeon: Mellody Drown, MD;  Location: [facility] ORS;  Service: Gynecology;  Laterality: Bilateral;  . ROTATOR CUFF REPAIR Right   . SENTINEL NODE BIOPSY N/A 03/01/2017   Procedure: SENTINEL NODE INJECTION AND BIOPSY;  Surgeon: Mellody Drown, MD;  Location: [facility] ORS;  Service: Gynecology;  Laterality: N/A;  . TONSILLECTOMY     as a child  . TUBAL LIGATION    . UPPER GI ENDOSCOPY     x2    OB History    Gravida  2   Para  2   Term  2   Preterm      AB      Living  2     SAB      TAB      Ectopic      Multiple      Live Births  2            Home Medications    Prior to Admission medications   Medication Sig Start Date End Date Taking? Authorizing Provider  ACCU-CHEK AVIVA PLUS test strip TEST TWO TIMES DAILY 10/24/19   Crecencio Mc, MD  acetaminophen (TYLENOL) 500 MG tablet Take 2 tablets (1,000 mg total) by mouth every 6 (six) hours. Patient taking differently: Take 1,000 mg every 6 (six) hours as needed by mouth.  03/01/17   Ward, Honor Loh, MD  albuterol (PROVENTIL HFA;VENTOLIN HFA) 108 (90 BASE) MCG/ACT inhaler Inhale 2 puffs into the lungs every 6 (six) hours as needed for wheezing or shortness of breath.     [provider]  Ascorbic Acid 500 MG CHEW Chew by mouth.    [provider]  aspirin EC 81 MG tablet Take 81 mg by mouth every evening.     [provider]  atorvastatin (LIPITOR) 40 MG tablet Take 0.5 tablets (20 mg total) by mouth daily at 6 PM. 02/25/20   Crecencio Mc, MD  azelastine (ASTELIN) 0.1 % nasal spray Place 1 spray into both nostrils daily.    [provider]  benzonatate (TESSALON) 100 MG capsule Take 1 capsule (100 mg total) by mouth 3 (three) times daily as needed for cough. 06/02/20   Sharion Balloon, NP  Biotin 5000 MCG TABS Take by mouth 2 (two) times daily.      [provider]  blood glucose meter kit and supplies Use to check blood sugar twice daily. ICD 10: E11.29 10/28/19   Crecencio Mc, MD  Cetirizine HCl 10 MG CAPS Take by mouth.    [provider]  Cinnamon Bark POWD Take by mouth.    [provider]  COLLAGEN PO Take by mouth 2 (two) times daily. For improved fingernail strength    [provider]  docusate sodium (COLACE) 100 MG capsule Take 1 capsule (100 mg total) by mouth 2 (two) times daily. 03/01/17   Ward, Honor Loh, MD  fish oil-omega-3 fatty acids 1000 MG capsule Take 1 g by mouth every evening.     [provider]  fluticasone (FLONASE) 50 MCG/ACT nasal spray  Place 1 spray into both nostrils daily. 12/06/18   Crecencio Mc, MD  furosemide (LASIX) 20 MG tablet Take 1 tablet (20 mg total) by mouth every other day. 01/02/20   Crecencio Mc, MD  JANUVIA 100 MG tablet TAKE 1 TABLET BY MOUTH  DAILY 08/13/19   Crecencio Mc, MD  Lancet Devices [facility]) lancets Use to test blood sugars 1 -2 times daily 04/19/17   Crecencio Mc, MD  LEVEMIR 100 UNIT/ML injection INJECT 20 UNITS SUBCUTANEOUSLY AT BEDTIME 04/27/20   Burnard Hawthorne, FNP  loratadine (CLARITIN) 10 MG tablet Take 10 mg by mouth daily.    [provider]  losartan-hydrochlorothiazide (HYZAAR) 50-12.5 MG tablet TAKE 1 TABLET BY MOUTH  DAILY 07/08/19   Crecencio Mc, MD  Magnesium 250 MG TABS Take 1 tablet by mouth daily.    [provider]  meclizine (ANTIVERT) 25 MG tablet Take 1 tablet (25 mg total) by mouth 3 (three) times daily as needed for dizziness. 03/28/19   Crecencio Mc, MD  metFORMIN (GLUCOPHAGE) 500 MG tablet Take 1 tablet (500 mg total) by mouth 2 (two) times daily with a meal. 04/20/20   Crecencio Mc, MD  Multiple Vitamin (MULTIVITAMIN) tablet Take 1 tablet by mouth daily.    [provider]  nitroGLYCERIN (NITROSTAT) 0.4 MG SL tablet Place 1 tablet (0.4 mg total) under the  tongue every 5 (five) minutes as needed for chest pain. 07/03/19   Crecencio Mc, MD  NON FORMULARY medroxy progesterone one 79m pill once daily    [provider]  oxybutynin (DITROPAN-XL) 10 MG 24 hr tablet Take 1 tablet (10 mg total) by mouth daily. 04/20/20   MBjorn Loser MD  pantoprazole (PROTONIX) 40 MG tablet Take 1 tablet by mouth twice daily 09/16/19   TCrecencio Mc MD  potassium chloride SA (KLOR-CON) 20 MEQ tablet TAKE 1 TABLET BY MOUTH EVERY OTHER DAY 04/16/20   TCrecencio Mc MD  Probiotic Product (PROBIOTIC-10) CAPS Take 1 capsule by mouth daily.     [provider]  sertraline (ZOLOFT) 50 MG tablet Take 1 tablet (50 mg total) by mouth daily. 01/02/20   TCrecencio Mc MD  tiZANidine (ZANAFLEX) 4 MG tablet tizanidine 4 mg tablet    [provider]  trospium (SANCTURA) 20 MG tablet Take 1 tablet (20 mg total) by mouth 2 (two) times daily. 03/02/20   MBjorn Loser MD  verapamil (VERELAN PM) 180 MG 24 hr capsule Take 1 capsule (180 mg total) by mouth daily. 01/02/20   TCrecencio Mc MD  vitamin B-12 (CYANOCOBALAMIN) 1000 MCG tablet Take 1,000 mcg by mouth daily.    [provider]  Zinc 50 MG TABS Take by mouth daily.    [provider]    Family History Family History  Problem Relation Age of Onset  . Stroke Mother   . Hypertension Mother   . Lupus Mother   . Alcohol abuse Father   . Diabetes Father   . Cancer Father        gallbladder ca into liver   . Arthritis Sister   . Diabetes Sister   . Lumbar disc disease Sister   . Hyperlipidemia Daughter   . Hypertension Daughter   . Cancer Maternal Aunt        ovarian ca  . Diabetes Paternal Aunt   . Drug abuse Paternal Aunt   . Cancer Maternal Grandfather  colon ca  . Hypertension Brother   . Alcohol abuse Brother   . Stroke Brother     Social History Social History   Tobacco Use  . Smoking status: Never Smoker  . Smokeless tobacco: Never Used  Vaping  Use  . Vaping Use: Never used  Substance Use Topics  . Alcohol use: Not Currently    Comment: rare  . Drug use: No     Allergies   Clindamycin/lincomycin, Codeine, Lincomycin, Morphine and related, Talwin [pentazocine], Lisinopril, Naloxone, and Oxycodone-acetaminophen   Review of Systems Review of Systems  Constitutional: Negative for chills and fever.  HENT: Negative for ear pain and sore throat.   Eyes: Negative for pain and visual disturbance.  Respiratory: Positive for cough. Negative for shortness of breath.   Cardiovascular: Negative for chest pain and palpitations.  Gastrointestinal: Negative for abdominal pain and vomiting.  Genitourinary: Negative for dysuria and hematuria.  Musculoskeletal: Negative for arthralgias and back pain.  Skin: Negative for color change and rash.  Neurological: Negative for seizures and syncope.  All other systems reviewed and are negative.    Physical Exam Triage Vital Signs ED Triage Vitals  Enc Vitals Group     BP 06/02/20 1651 132/85     Pulse Rate 06/02/20 1651 (!) 58     Resp 06/02/20 1651 14     Temp 06/02/20 1651 98.5 F (36.9 C)     Temp src --      SpO2 06/02/20 1651 97 %     Weight --      Height --      Head Circumference --      Peak Flow --      Pain Score 06/02/20 1648 0     Pain Loc --      Pain Edu? --      Excl. in [place]? --    No data found.  Updated Vital Signs BP 132/85   Pulse (!) 58   Temp 98.5 F (36.9 C)   Resp 14   SpO2 97%   Visual Acuity Right Eye Distance:   Left Eye Distance:   Bilateral Distance:    Right Eye Near:   Left Eye Near:    Bilateral Near:     Physical Exam Vitals and nursing note reviewed.  Constitutional:      General: She is not in acute distress.    Appearance: She is well-developed. She is not ill-appearing.  HENT:     Head: Normocephalic and atraumatic.     Right Ear: Tympanic membrane normal.     Left Ear: Tympanic membrane normal.     Nose: Nose normal.      Mouth/Throat:     Mouth: Mucous membranes are moist.     Pharynx: Oropharynx is clear.  Eyes:     Conjunctiva/sclera: Conjunctivae normal.  Cardiovascular:     Rate and Rhythm: Normal rate and regular rhythm.     Heart sounds: No murmur heard.   Pulmonary:     Effort: Pulmonary effort is normal. No respiratory distress.     Breath sounds: Normal breath sounds. No wheezing or rhonchi.  Abdominal:     Palpations: Abdomen is soft.     Tenderness: There is no abdominal tenderness. There is no guarding or rebound.  Musculoskeletal:     Cervical back: Neck supple.  Skin:    General: Skin is warm and dry.     Findings: No rash.  Neurological:     General: No  focal deficit present.     Mental Status: She is alert and oriented to person, place, and time.     Gait: Gait normal.  Psychiatric:        Mood and Affect: Mood normal.        Behavior: Behavior normal.      UC Treatments / Results  Labs (all labs ordered are listed, but only abnormal results are displayed) Labs Reviewed  NOVEL CORONAVIRUS, NAA    EKG   Radiology No results found.  Procedures Procedures (including critical care time)  Medications Ordered in UC Medications - No data to display  Initial Impression / Assessment and Plan / UC Course  I have reviewed the triage vital signs and the nursing notes.  Pertinent labs & imaging results that were available during my care of the patient were reviewed by me and considered in my medical decision making (see chart for details).   Cough.  Treating with Tessalon Perles.  PCR COVID pending.  Instructed patient to self quarantine until the test result is back.  Discussed symptomatic treatment including Tylenol, rest, hydration.  Instructed patient to go to the ED if she has acute worsening symptoms.  Patient agrees to plan of care.    Final Clinical Impressions(s) / UC Diagnoses   Final diagnoses:  Cough     Discharge Instructions     Take the Tessalon  Perles as needed for cough.    Your COVID test is pending.  You should self quarantine until the test result is back.    Take Tylenol as needed for fever or discomfort.  Rest and keep yourself hydrated.    Go to the emergency department if you develop acute worsening symptoms.        ED Prescriptions    Medication Sig Dispense Auth. Provider   benzonatate (TESSALON) 100 MG capsule Take 1 capsule (100 mg total) by mouth 3 (three) times daily as needed for cough. 21 capsule Sharion Balloon, NP     PDMP not reviewed this encounter.   Sharion Balloon, NP 06/02/20 1726

## 2020-06-02 NOTE — Discharge Instructions (Signed)
Take the Tessalon Perles as needed for cough.    Your COVID test is pending.  You should self quarantine until the test result is back.    Take Tylenol as needed for fever or discomfort.  Rest and keep yourself hydrated.    Go to the emergency department if you develop acute worsening symptoms.     

## 2020-06-03 DIAGNOSIS — D239 Other benign neoplasm of skin, unspecified: Secondary | ICD-10-CM | POA: Diagnosis not present

## 2020-06-03 DIAGNOSIS — Z961 Presence of intraocular lens: Secondary | ICD-10-CM | POA: Diagnosis not present

## 2020-06-03 DIAGNOSIS — H04123 Dry eye syndrome of bilateral lacrimal glands: Secondary | ICD-10-CM | POA: Diagnosis not present

## 2020-06-03 DIAGNOSIS — H02831 Dermatochalasis of right upper eyelid: Secondary | ICD-10-CM | POA: Diagnosis not present

## 2020-06-03 DIAGNOSIS — D23121 Other benign neoplasm of skin of left upper eyelid, including canthus: Secondary | ICD-10-CM | POA: Diagnosis not present

## 2020-06-03 DIAGNOSIS — H02834 Dermatochalasis of left upper eyelid: Secondary | ICD-10-CM | POA: Diagnosis not present

## 2020-06-03 DIAGNOSIS — H02403 Unspecified ptosis of bilateral eyelids: Secondary | ICD-10-CM | POA: Diagnosis not present

## 2020-06-04 ENCOUNTER — Ambulatory Visit: Payer: Medicare Other | Admitting: Radiation Oncology

## 2020-06-04 LAB — SARS-COV-2, NAA 2 DAY TAT

## 2020-06-04 LAB — NOVEL CORONAVIRUS, NAA: SARS-CoV-2, NAA: NOT DETECTED

## 2020-06-08 ENCOUNTER — Other Ambulatory Visit: Payer: Self-pay

## 2020-06-08 ENCOUNTER — Ambulatory Visit
Admission: RE | Admit: 2020-06-08 | Discharge: 2020-06-08 | Disposition: A | Discharge status: Home / Self Care | Payer: Medicare PPO | Source: Ambulatory Visit | Attending: Radiation Oncology | Admitting: Radiation Oncology

## 2020-06-08 VITALS — Wt 174.0 lb

## 2020-06-08 DIAGNOSIS — Z90710 Acquired absence of both cervix and uterus: Secondary | ICD-10-CM | POA: Diagnosis not present

## 2020-06-08 DIAGNOSIS — Z90722 Acquired absence of ovaries, bilateral: Secondary | ICD-10-CM | POA: Diagnosis not present

## 2020-06-08 DIAGNOSIS — C55 Malignant neoplasm of uterus, part unspecified: Secondary | ICD-10-CM

## 2020-06-08 DIAGNOSIS — Z8542 Personal history of malignant neoplasm of other parts of uterus: Secondary | ICD-10-CM | POA: Diagnosis not present

## 2020-06-08 DIAGNOSIS — Z923 Personal history of irradiation: Secondary | ICD-10-CM | POA: Diagnosis not present

## 2020-06-08 NOTE — Progress Notes (Signed)
Radiation Oncology Follow up Note  Name: Sandra Hendrix   Date:   06/08/2020 MRN:  [id number] DOB: [birth year]    This 74 y.o. female presents to the clinic today for 3-year follow-up status post radiation therapy for stage I well-differentiated endometrial adenocarcinoma status post TAH/BSO with positive cytology.  REFERRING PROVIDER: Crecencio Mc, MD  HPI: Patient is a 74 year old female now at 3 years having completed external beam radiation therapy to her pelvis for stage Ib well differentiated endometrioid adenocarcinoma status post TAH/BSO with positive cytology.  Seen today in routine follow-up she is doing well she specifically denies any increased lower urinary tract symptoms diarrhea or fatigue..  She has been having follow-up pelvic examinations by both her gynecologist and GYN oncology all finding no evidence of disease.  COMPLICATIONS OF TREATMENT: None  FOLLOW UP COMPLIANCE: Excellent  PHYSICAL EXAM:  Wt 174 lb (78.9 kg)   BMI 32.88 kg/m  Well-developed well-nourished patient in NAD. HEENT reveals PERLA, EOMI, discs not visualized.  Oral cavity is clear. No oral mucosal lesions are identified. Neck is clear without evidence of cervical or supraclavicular adenopathy. Lungs are clear to A&P. Cardiac examination is essentially unremarkable with regular rate and rhythm without murmur rub or thrill. Abdomen is benign with no organomegaly or masses noted. Motor sensory and DTR levels are equal and symmetric in the upper and lower extremities. Cranial nerves II through XII are grossly intact. Proprioception is intact. No peripheral adenopathy or edema is identified. No motor or sensory levels are noted. Crude visual fields are within normal range.  RADIOLOGY RESULTS: No current films to review  PLAN: Present time I went to turn follow-up care over to her gynecologist GYN oncology who will be performing routine examinations.  She is now 3 years out with no evidence of disease.  I  be happy to reevaluate the patient in time should further consultation be indicated.  Patient is fine with my turning over follow-up care to those physicians.  I would like to take this opportunity to thank you for allowing me to participate in the care of your patient.Noreene Filbert, MD

## 2020-06-11 ENCOUNTER — Other Ambulatory Visit: Payer: Self-pay | Admitting: Family

## 2020-06-29 ENCOUNTER — Ambulatory Visit (INDEPENDENT_AMBULATORY_CARE_PROVIDER_SITE_OTHER): Payer: Medicare PPO

## 2020-06-29 ENCOUNTER — Other Ambulatory Visit: Payer: Self-pay

## 2020-06-29 DIAGNOSIS — Z794 Long term (current) use of insulin: Secondary | ICD-10-CM

## 2020-06-29 DIAGNOSIS — I150 Renovascular hypertension: Secondary | ICD-10-CM | POA: Diagnosis not present

## 2020-06-29 DIAGNOSIS — E1121 Type 2 diabetes mellitus with diabetic nephropathy: Secondary | ICD-10-CM

## 2020-06-30 NOTE — Progress Notes (Signed)
Your ultrasound suggests that you have a partial blockage of the blood flow to your right kidney.  This can cause problems in regulating blood pressure and contribute to kidney disease.  I am recommending an evaluation by a vascular surgeon to determine if intervention is needed.  There is only one vascular surgery group in [place],  but [place] [employer] [place] has several more. That are very good   Do you have a preference? If not,  I will ask for whoever can see you first given how long the wait time can be (months)

## 2020-07-01 DIAGNOSIS — I701 Atherosclerosis of renal artery: Secondary | ICD-10-CM | POA: Insufficient documentation

## 2020-07-01 NOTE — Addendum Note (Signed)
Addended by: Crecencio Mc on: 07/01/2020 07:48 PM   Modules accepted: Orders

## 2020-07-01 NOTE — Assessment & Plan Note (Signed)
Suggested by dopplers.  Vascular referral in progress

## 2020-07-07 ENCOUNTER — Other Ambulatory Visit: Payer: Self-pay | Admitting: Internal Medicine

## 2020-07-09 DIAGNOSIS — Z1231 Encounter for screening mammogram for malignant neoplasm of breast: Secondary | ICD-10-CM | POA: Diagnosis not present

## 2020-07-09 LAB — HM MAMMOGRAPHY

## 2020-07-14 ENCOUNTER — Telehealth: Payer: Self-pay | Admitting: Internal Medicine

## 2020-07-14 ENCOUNTER — Other Ambulatory Visit: Payer: Self-pay | Admitting: Internal Medicine

## 2020-07-14 MED ORDER — INSULIN DETEMIR 100 UNIT/ML ~~LOC~~ SOLN
SUBCUTANEOUS | 2 refills | Status: DC
Start: 2020-07-14 — End: 2020-08-31

## 2020-07-14 NOTE — Telephone Encounter (Signed)
Pt is in [place] and forget her levemir 20 units qd vial but has needles and syringes and requests refill. She tried to call [facility] but she was out of refills so requested refills be sent to [facility] today   Sent for pt 07/14/20

## 2020-07-14 NOTE — Telephone Encounter (Signed)
Thank you for doing that.

## 2020-08-04 DIAGNOSIS — E119 Type 2 diabetes mellitus without complications: Secondary | ICD-10-CM | POA: Diagnosis not present

## 2020-08-04 DIAGNOSIS — Z7984 Long term (current) use of oral hypoglycemic drugs: Secondary | ICD-10-CM | POA: Diagnosis not present

## 2020-08-04 DIAGNOSIS — H04123 Dry eye syndrome of bilateral lacrimal glands: Secondary | ICD-10-CM | POA: Diagnosis not present

## 2020-08-04 LAB — HM DIABETES EYE EXAM

## 2020-08-10 ENCOUNTER — Other Ambulatory Visit: Payer: Self-pay

## 2020-08-10 ENCOUNTER — Encounter (INDEPENDENT_AMBULATORY_CARE_PROVIDER_SITE_OTHER): Payer: Self-pay | Admitting: Vascular Surgery

## 2020-08-10 ENCOUNTER — Ambulatory Visit (INDEPENDENT_AMBULATORY_CARE_PROVIDER_SITE_OTHER): Payer: Medicare PPO | Admitting: Vascular Surgery

## 2020-08-10 VITALS — BP 105/71 | HR 64 | Resp 16 | Ht 61.0 in | Wt 169.6 lb

## 2020-08-10 DIAGNOSIS — I872 Venous insufficiency (chronic) (peripheral): Secondary | ICD-10-CM | POA: Diagnosis not present

## 2020-08-10 DIAGNOSIS — I701 Atherosclerosis of renal artery: Secondary | ICD-10-CM

## 2020-08-10 DIAGNOSIS — I10 Essential (primary) hypertension: Secondary | ICD-10-CM

## 2020-08-10 DIAGNOSIS — K219 Gastro-esophageal reflux disease without esophagitis: Secondary | ICD-10-CM | POA: Diagnosis not present

## 2020-08-10 DIAGNOSIS — E785 Hyperlipidemia, unspecified: Secondary | ICD-10-CM | POA: Diagnosis not present

## 2020-08-10 NOTE — Progress Notes (Signed)
MRN : [id number]  Sandra Hendrix is a 74 y.o. (11-07-1945) female who presents with chief complaint of  Chief Complaint  Patient presents with  . New Patient (Initial Visit)    ref [place] renal artery stenosis  .  History of Present Illness:   The patient is seen for evaluation of hypertension which has been under good control on 3 medications. The patient has a long history of hypertension.   The patient does have family history of hypertension.   There is no prior documented abdominal bruit. The patient occasionally has flushing symptoms but denies palpitations. No episodes of syncope.There is no history of headache. There is no history of flash pulmonary edema.  The patient denies a history of renal disease.  The patient denies amaurosis fugax or recent TIA symptoms. There are no recent neurological changes noted. The patient denies claudication symptoms or rest pain symptoms. The patient denies history of DVT, PE or superficial thrombophlebitis. The patient denies recent episodes of angina or shortness of breath.   Duplex ultrasound of the renal arteries demonstrates mild elevation in the velocities of the right renal artery. High resistance is noted in the cortex in the right kidney is 2 cm smaller than the left. These findings are more consistent with intrinsic renal disease rather than renal artery stenosis. Left kidney appears normal without evidence of hemodynamically significant stenosis.   Current Meds  Medication Sig  . ACCU-CHEK AVIVA PLUS test strip TEST TWO TIMES DAILY  . acetaminophen (TYLENOL) 500 MG tablet Take 2 tablets (1,000 mg total) by mouth every 6 (six) hours. (Patient taking differently: Take 1,000 mg every 6 (six) hours as needed by mouth. )  . albuterol (PROVENTIL HFA;VENTOLIN HFA) 108 (90 BASE) MCG/ACT inhaler Inhale 2 puffs into the lungs every 6 (six) hours as needed for wheezing or shortness of breath.   . Ascorbic Acid 500 MG CHEW Chew by mouth.   Marland Kitchen aspirin EC 81 MG tablet Take 81 mg by mouth every evening.   Marland Kitchen atorvastatin (LIPITOR) 40 MG tablet Take 0.5 tablets (20 mg total) by mouth daily at 6 PM.  . azelastine (ASTELIN) 0.1 % nasal spray Place 1 spray into both nostrils daily.  . benzonatate (TESSALON) 100 MG capsule Take 1 capsule (100 mg total) by mouth 3 (three) times daily as needed for cough.  . Biotin 5000 MCG TABS Take by mouth 2 (two) times daily.   . blood glucose meter kit and supplies Use to check blood sugar twice daily. ICD 10: E11.29  . Cetirizine HCl 10 MG CAPS Take by mouth.  Verneita Griffes Bark POWD Take by mouth.  . COLLAGEN PO Take by mouth 2 (two) times daily. For improved fingernail strength  . docusate sodium (COLACE) 100 MG capsule Take 1 capsule (100 mg total) by mouth 2 (two) times daily.  . fish oil-omega-3 fatty acids 1000 MG capsule Take 1 g by mouth every evening.   . fluticasone (FLONASE) 50 MCG/ACT nasal spray Place 1 spray into both nostrils daily.  . furosemide (LASIX) 20 MG tablet Take 1 tablet (20 mg total) by mouth every other day.  . insulin detemir (LEVEMIR) 100 UNIT/ML injection INJECT 20 UNITS SUBCUTANEOUSLY AT BEDTIME  . JANUVIA 100 MG tablet TAKE 1 TABLET BY MOUTH  DAILY  . Lancet Devices (ACCU-CHEK SOFTCLIX) lancets Use to test blood sugars 1 -2 times daily  . loratadine (CLARITIN) 10 MG tablet Take 10 mg by mouth daily.  Marland Kitchen losartan-hydrochlorothiazide (HYZAAR) 50-12.5 MG  tablet TAKE 1 TABLET BY MOUTH  DAILY  . Magnesium 250 MG TABS Take 1 tablet by mouth daily.  . meclizine (ANTIVERT) 25 MG tablet Take 1 tablet (25 mg total) by mouth 3 (three) times daily as needed for dizziness.  . metFORMIN (GLUCOPHAGE) 500 MG tablet Take 1 tablet (500 mg total) by mouth 2 (two) times daily with a meal.  . Misc Natural Products (NEURIVA PO) Take by mouth in the morning and at bedtime.  . Multiple Vitamin (MULTIVITAMIN) tablet Take 1 tablet by mouth daily.  . nitroGLYCERIN (NITROSTAT) 0.4 MG SL tablet Place  1 tablet (0.4 mg total) under the tongue every 5 (five) minutes as needed for chest pain.  . NON FORMULARY medroxy progesterone one 110m pill once daily  . oxybutynin (DITROPAN-XL) 10 MG 24 hr tablet Take 1 tablet (10 mg total) by mouth daily.  . pantoprazole (PROTONIX) 40 MG tablet Take 1 tablet by mouth twice daily  . potassium chloride SA (KLOR-CON) 20 MEQ tablet TAKE 1 TABLET BY MOUTH EVERY OTHER DAY  . Probiotic Product (PROBIOTIC-10) CAPS Take 1 capsule by mouth daily.   . sertraline (ZOLOFT) 50 MG tablet Take 1 tablet (50 mg total) by mouth daily.  .Marland KitchentiZANidine (ZANAFLEX) 4 MG tablet tizanidine 4 mg tablet  . trospium (SANCTURA) 20 MG tablet Take 1 tablet (20 mg total) by mouth 2 (two) times daily.  . verapamil (VERELAN PM) 180 MG 24 hr capsule Take 1 capsule (180 mg total) by mouth daily.  . vitamin B-12 (CYANOCOBALAMIN) 1000 MCG tablet Take 1,000 mcg by mouth daily.  . Zinc 50 MG TABS Take by mouth daily.    Past Medical History:  Diagnosis Date  . Allergy   . Anxiety   . Arthritis   . Barrett's esophagus determined by endoscopy   . Cancer ([facility]    Uterine Cancer  . Chronic kidney disease   . Depression   . [place]   . Difficult intubation    "not sure what happened,asleep was told had problem with intubation"  . Diverticulosis   . Fatty liver   . GERD (gastroesophageal reflux disease)   . History of bronchitis   . History of hiatal hernia   . Hyperlipidemia   . Hypertension   . Sleep apnea    Use C- PAP  . Urinary incontinence   . Varicose veins     Past Surgical History:  Procedure Laterality Date  . CARDIAC CATHETERIZATION    . CHOLECYSTECTOMY    . COLONOSCOPY WITH PROPOFOL N/A 06/14/2016   Procedure: COLONOSCOPY WITH PROPOFOL;  Surgeon: MLollie Sails MD;  [facility]  Service: Endoscopy;  Laterality: N/A;  . ESOPHAGOGASTRODUODENOSCOPY (EGD) WITH PROPOFOL N/A 06/14/2016   Procedure:  ESOPHAGOGASTRODUODENOSCOPY (EGD) WITH PROPOFOL;  Surgeon: MLollie Sails MD;  [facility];  Service: Endoscopy;  Laterality: N/A;  . ESOPHAGOGASTRODUODENOSCOPY (EGD) WITH PROPOFOL N/A 06/12/2017   Procedure: ESOPHAGOGASTRODUODENOSCOPY (EGD) WITH PROPOFOL;  Surgeon: SLollie Sails MD;  [facility]  Service: Endoscopy;  Laterality: N/A;  . ESOPHAGOGASTRODUODENOSCOPY (EGD) WITH PROPOFOL N/A 08/25/2017   Procedure: ESOPHAGOGASTRODUODENOSCOPY (EGD) WITH PROPOFOL;  Surgeon: SLollie Sails MD;  [facility];  Service: Endoscopy;  Laterality: N/A;  . HYSTEROSCOPY WITH D & C N/A 01/02/2017   Procedure: DILATATION AND CURETTAGE /HYSTEROSCOPY;  Surgeon: MBrayton Mars MD;  Location: [facility] ORS;  Service: Gynecology;  Laterality: N/A;  . JOINT REPLACEMENT Right 2007   Total Knee Replacement  .  JOINT REPLACEMENT Left 2004   Total Knee Replacement  . LAPAROSCOPIC HYSTERECTOMY Bilateral 03/01/2017   Procedure: HYSTERECTOMY TOTAL LAPAROSCOPIC BSO;  Surgeon: Mellody Drown, MD;  Location: [facility] ORS;  Service: Gynecology;  Laterality: Bilateral;  . ROTATOR CUFF REPAIR Right   . SENTINEL NODE BIOPSY N/A 03/01/2017   Procedure: SENTINEL NODE INJECTION AND BIOPSY;  Surgeon: Mellody Drown, MD;  Location: [facility] ORS;  Service: Gynecology;  Laterality: N/A;  . TONSILLECTOMY     as a child  . TUBAL LIGATION    . UPPER GI ENDOSCOPY     x2    Social History Social History   Tobacco Use  . Smoking status: Never Smoker  . Smokeless tobacco: Never Used  Vaping Use  . Vaping Use: Never used  Substance Use Topics  . Alcohol use: Not Currently    Comment: rare  . Drug use: No    Family History Family History  Problem Relation Age of Onset  . Stroke Mother   . Hypertension Mother   . Lupus Mother   . Alcohol abuse Father   . Diabetes Father   . Cancer Father        gallbladder ca into liver   . Arthritis Sister   . Diabetes Sister   . Lumbar disc  disease Sister   . Hyperlipidemia Daughter   . Hypertension Daughter   . Cancer Maternal Aunt        ovarian ca  . Diabetes Paternal Aunt   . Drug abuse Paternal Aunt   . Cancer Maternal Grandfather        colon ca  . Hypertension Brother   . Alcohol abuse Brother   . Stroke Brother   No family history of bleeding/clotting disorders, porphyria or autoimmune disease   Allergies  Allergen Reactions  . Clindamycin/Lincomycin Rash  . Codeine Rash  . Lincomycin Rash  . Morphine And Related Nausea Only  . Talwin [Pentazocine] Nausea And Vomiting  . Lisinopril Cough    cough  . Naloxone Other (See Comments)    Had issues with sedation and vomiting with EGD  . Oxycodone-Acetaminophen Other (See Comments)    Pt states this medication makes her bowels stop moving     REVIEW OF SYSTEMS (Negative unless checked)  Constitutional: '[]' Weight loss  '[]' Fever  '[]' Chills Cardiac: '[]' Chest pain   '[]' Chest pressure   '[]' Palpitations   '[]' Shortness of breath when laying flat   '[]' Shortness of breath with exertion. Vascular:  '[]' Pain in legs with walking   '[]' Pain in legs at rest  '[]' History of DVT   '[]' Phlebitis   '[]' Swelling in legs   '[]' Varicose veins   '[]' Non-healing ulcers Pulmonary:   '[]' Uses home oxygen   '[]' Productive cough   '[]' Hemoptysis   '[]' Wheeze  '[]' COPD   '[]' Asthma Neurologic:  '[]' Dizziness   '[]' Seizures   '[]' History of stroke   '[]' History of TIA  '[]' Aphasia   '[]' Vissual changes   '[]' Weakness or numbness in arm   '[]' Weakness or numbness in leg Musculoskeletal:   '[]' Joint swelling   '[]' Joint pain   '[]' Low back pain Hematologic:  '[]' Easy bruising  '[]' Easy bleeding   '[]' Hypercoagulable state   '[]' Anemic Gastrointestinal:  '[]' Diarrhea   '[]' Vomiting  '[x]' Gastroesophageal reflux/heartburn   '[]' Difficulty swallowing. Genitourinary:  '[]' Chronic kidney disease   '[]' Difficult urination  '[]' Frequent urination   '[]' Blood in urine Skin:  '[]' Rashes   '[]' Ulcers  Psychological:  '[]' History of anxiety   '[]'  History of major  depression.  Physical Examination  Vitals:   08/10/20 0913  BP: 105/71  Pulse: 64  Resp: 16  Weight: 169 lb 9.6 oz (76.9 kg)  Height: '5\' 1"'  (1.549 m)   Body mass index is 32.05 kg/m. Gen: WD/WN, NAD Head: Peak/AT, No temporalis wasting.  Ear/Nose/Throat: Hearing grossly intact, nares w/o erythema or drainage, poor dentition Eyes: PER, EOMI, sclera nonicteric.  Neck: Supple, no masses.  No bruit or JVD.  Pulmonary:  Good air movement, clear to auscultation bilaterally, no use of accessory muscles.  Cardiac: RRR, normal S1, S2, no Murmurs. Vascular: No abdominal or flank bruits were auscultated Vessel Right Left  Radial Palpable Palpable  Gastrointestinal: soft, non-distended. No guarding/no peritoneal signs.  Musculoskeletal: M/S 5/5 throughout.  No deformity or atrophy.  Neurologic: CN 2-12 intact. Pain and light touch intact in extremities.  Symmetrical.  Speech is fluent. Motor exam as listed above. Psychiatric: Judgment intact, Mood & affect appropriate for pt's clinical situation. Dermatologic: No rashes or ulcers noted.  No changes consistent with cellulitis.  CBC Lab Results  Component Value Date   WBC 4.2 05/15/2017   HGB 12.0 05/15/2017   HCT 34.5 (L) 05/15/2017   MCV 88.0 05/15/2017   PLT 316 05/15/2017    BMET    Component Value Date/Time   NA 141 05/15/2020 1007   NA 142 11/18/2014 0000   K 3.7 05/15/2020 1007   CL 104 05/15/2020 1007   CO2 28 05/15/2020 1007   GLUCOSE 105 (H) 05/15/2020 1007   BUN 20 05/15/2020 1007   BUN 21 11/29/2016 0000   CREATININE 0.98 05/15/2020 1007   CALCIUM 9.8 05/15/2020 1007   GFRNONAA 52 (L) 02/27/2017 1117   GFRAA 60 (L) 02/27/2017 1117   CrCl cannot be calculated (Patient's most recent lab result is older than the maximum 21 days allowed.).  COAG Lab Results  Component Value Date   INR 1.1 01/23/2009    Radiology No results found.   Assessment/Plan 1. Renal artery stenosis (HCC) Given patient's arterial  disease optimal control of the patient's hypertension is important. BP is acceptable today and at the present time she is on only 3 medications at fairly typical doses.  The patient's vital signs and noninvasive studies support the renal artery stenosis is not significantly.  Duplex ultrasound of the renal arteries demonstrates mild elevation in the velocities of the right renal artery. High resistance is noted in the cortex in the right kidney is 2 cm smaller than the left. These findings are more consistent with intrinsic renal disease rather than renal artery stenosis. Left kidney appears normal without evidence of hemodynamically significant stenosis.  No invasive studies or intervention is indicated at this time.  The patient will continue the current antihypertensive medications, no changes at this time.  The primary medical service will continue aggressive antihypertensive therapy as per the AHA guidelines   - VAS US RENAL ARTERY DUPLEX; Future  2. Essential hypertension Continue antihypertensive medications as already ordered, these medications have been reviewed and there are no changes at this time.   3. Chronic venous insufficiency No surgery or intervention at this point in time.    I have had a long discussion with the patient regarding venous insufficiency and why it  causes symptoms. I have discussed with the patient the chronic skin changes that accompany venous insufficiency and the long term sequela such as infection and ulceration.  Patient will begin wearing graduated compression stockings class 1 (20-30 mmHg) or compression wraps on a daily basis a prescription was given. The patient will put  the stockings on first thing in the morning and removing them in the evening. The patient is instructed specifically not to sleep in the stockings.    In addition, behavioral modification including several periods of elevation of the lower extremities during the day will be continued.  I have demonstrated that proper elevation is a position with the ankles at heart level.  The patient is instructed to begin routine exercise, especially walking on a daily basis  Following the review of the ultrasound the patient will follow up in 6 months to reassess the degree of swelling and the control that graduated compression stockings or compression wraps  is offering.   The patient can be assessed for a Lymph Pump at that time  4. Gastroesophageal reflux disease, unspecified whether esophagitis present Continue PPI as already ordered, this medication has been reviewed and there are no changes at this time.  Avoidence of caffeine and alcohol  Moderate elevation of the head of the bed   5. Hyperlipidemia LDL goal <100 Continue statin as ordered and reviewed, no changes at this time     Hortencia Pilar, MD  08/10/2020 9:18 AM

## 2020-08-11 ENCOUNTER — Encounter (INDEPENDENT_AMBULATORY_CARE_PROVIDER_SITE_OTHER): Payer: Self-pay | Admitting: Vascular Surgery

## 2020-08-12 ENCOUNTER — Other Ambulatory Visit: Payer: Self-pay | Admitting: Gastroenterology

## 2020-08-12 DIAGNOSIS — R1312 Dysphagia, oropharyngeal phase: Secondary | ICD-10-CM | POA: Diagnosis not present

## 2020-08-12 DIAGNOSIS — R198 Other specified symptoms and signs involving the digestive system and abdomen: Secondary | ICD-10-CM | POA: Diagnosis not present

## 2020-08-12 DIAGNOSIS — R490 Dysphonia: Secondary | ICD-10-CM | POA: Diagnosis not present

## 2020-08-18 ENCOUNTER — Ambulatory Visit: Payer: Medicare PPO | Admitting: Internal Medicine

## 2020-08-25 ENCOUNTER — Ambulatory Visit: Payer: Medicare PPO

## 2020-08-26 DIAGNOSIS — G4733 Obstructive sleep apnea (adult) (pediatric): Secondary | ICD-10-CM | POA: Diagnosis not present

## 2020-08-27 DIAGNOSIS — R600 Localized edema: Secondary | ICD-10-CM | POA: Diagnosis not present

## 2020-08-27 DIAGNOSIS — E1122 Type 2 diabetes mellitus with diabetic chronic kidney disease: Secondary | ICD-10-CM | POA: Diagnosis not present

## 2020-08-27 DIAGNOSIS — N1831 Chronic kidney disease, stage 3a: Secondary | ICD-10-CM | POA: Diagnosis not present

## 2020-08-27 DIAGNOSIS — I10 Essential (primary) hypertension: Secondary | ICD-10-CM | POA: Diagnosis not present

## 2020-08-31 ENCOUNTER — Other Ambulatory Visit: Payer: Self-pay | Admitting: Internal Medicine

## 2020-09-02 ENCOUNTER — Other Ambulatory Visit: Payer: Self-pay

## 2020-09-02 ENCOUNTER — Ambulatory Visit
Admission: RE | Admit: 2020-09-02 | Discharge: 2020-09-02 | Disposition: A | Discharge status: Home / Self Care | Payer: Medicare PPO | Source: Ambulatory Visit | Attending: Gastroenterology | Admitting: Gastroenterology

## 2020-09-02 DIAGNOSIS — K219 Gastro-esophageal reflux disease without esophagitis: Secondary | ICD-10-CM | POA: Diagnosis not present

## 2020-09-02 DIAGNOSIS — R1312 Dysphagia, oropharyngeal phase: Secondary | ICD-10-CM | POA: Diagnosis not present

## 2020-09-03 DIAGNOSIS — K219 Gastro-esophageal reflux disease without esophagitis: Secondary | ICD-10-CM | POA: Diagnosis not present

## 2020-09-03 DIAGNOSIS — R42 Dizziness and giddiness: Secondary | ICD-10-CM | POA: Diagnosis not present

## 2020-09-03 DIAGNOSIS — R1314 Dysphagia, pharyngoesophageal phase: Secondary | ICD-10-CM | POA: Diagnosis not present

## 2020-09-03 DIAGNOSIS — R059 Cough, unspecified: Secondary | ICD-10-CM | POA: Diagnosis not present

## 2020-09-03 DIAGNOSIS — R490 Dysphonia: Secondary | ICD-10-CM | POA: Diagnosis not present

## 2020-09-07 ENCOUNTER — Encounter: Payer: Self-pay | Admitting: Internal Medicine

## 2020-09-07 ENCOUNTER — Other Ambulatory Visit: Payer: Self-pay

## 2020-09-07 ENCOUNTER — Ambulatory Visit: Payer: Medicare PPO | Admitting: Internal Medicine

## 2020-09-07 VITALS — BP 110/64 | HR 75 | Temp 98.2°F | Resp 16 | Ht 61.0 in | Wt 171.0 lb

## 2020-09-07 DIAGNOSIS — M25571 Pain in right ankle and joints of right foot: Secondary | ICD-10-CM | POA: Diagnosis not present

## 2020-09-07 DIAGNOSIS — K449 Diaphragmatic hernia without obstruction or gangrene: Secondary | ICD-10-CM

## 2020-09-07 DIAGNOSIS — G8929 Other chronic pain: Secondary | ICD-10-CM | POA: Diagnosis not present

## 2020-09-07 DIAGNOSIS — I701 Atherosclerosis of renal artery: Secondary | ICD-10-CM | POA: Diagnosis not present

## 2020-09-07 DIAGNOSIS — K760 Fatty (change of) liver, not elsewhere classified: Secondary | ICD-10-CM | POA: Diagnosis not present

## 2020-09-07 DIAGNOSIS — E1121 Type 2 diabetes mellitus with diabetic nephropathy: Secondary | ICD-10-CM | POA: Diagnosis not present

## 2020-09-07 DIAGNOSIS — Z794 Long term (current) use of insulin: Secondary | ICD-10-CM | POA: Diagnosis not present

## 2020-09-07 DIAGNOSIS — N1831 Chronic kidney disease, stage 3a: Secondary | ICD-10-CM

## 2020-09-07 NOTE — Patient Instructions (Addendum)
Change your  levemir to morning dosing and reduce your  dose to 15 units   Continue Januvia with meal  For your ankle pain:  You can add up to 2000 mg of acetominophen (tylenol) every day safely  In divided doses (500 mg every 6 hours  Or 1000 mg every 12 hours.)  I will make a Referral to Dr.  Doran Durand at [facility]  in [place] for your righ tankle

## 2020-09-07 NOTE — Progress Notes (Signed)
Subjective:  Patient ID: Sandra Hendrix, female    DOB: 04-Oct-1945  Age: 74 y.o. MRN: [id number]  CC: The primary encounter diagnosis was Type 2 diabetes mellitus with diabetic nephropathy, with long-term current use of insulin ([place]). Diagnoses of Atherosclerosis of renal artery ([place]), Hepatic steatosis, Chronic pain of right ankle, Hiatal hernia, and Stage 3a chronic kidney disease ([place]) were also pertinent to this visit.  [name] presents for follow up on type 2 DM with CKD.  And other issues.  This visit occurred during the SARS-CoV-2 public health emergency.  Safety protocols were in place, including screening questions prior to the visit, additional usage of staff PPE, and extensive cleaning of exam room while observing appropriate contact time as indicated for disinfecting solutions.   Type 2 DM:  She has been omitting Levemir several nights per week  Because she forgets to take it until very late.  She did not bring her BS log with her but recalls that she has had 1-2 readings per week that were < 80 fasting   Hiatal hernia:  Noted during recent GI workup for dysphagia.  Diagnosis discussed in detail today with patient.   Right ankle pain:  She has limited ROM due to pain that has been presents for sevdral months with no recet history of fall or sprain.    Orthopedics  referral requested.  She has seen [facility] in [place] for other issues,  , discussed referral to Dr. Doran Durand   Renal artery atherosclerosis: noted during renal artery doppler for workup of resistant hypertension.  <60% stenosis noted on the riight.  Dicussed with patient.  She is taking a statin and last LDL was  < 70  Hypertension: patient checks blood pressure twice weekly at home.  Readings have been for the most part around  140/80 at rest . Patient is following a reduce salt diet most days and is taking medications as prescribed  Outpatient Medications Prior to Visit  Medication Sig Dispense Refill   . ACCU-CHEK AVIVA PLUS test strip TEST TWO TIMES DAILY 200 each 0  . acetaminophen (TYLENOL) 500 MG tablet Take 2 tablets (1,000 mg total) by mouth every 6 (six) hours. (Patient taking differently: Take 1,000 mg by mouth every 6 (six) hours as needed.) 65 tablet 1  . albuterol (PROVENTIL HFA;VENTOLIN HFA) 108 (90 BASE) MCG/ACT inhaler Inhale 2 puffs into the lungs every 6 (six) hours as needed for wheezing or shortness of breath.     . Ascorbic Acid 500 MG CHEW Chew by mouth.    Marland Kitchen aspirin EC 81 MG tablet Take 81 mg by mouth every evening.     Marland Kitchen atorvastatin (LIPITOR) 40 MG tablet Take 0.5 tablets (20 mg total) by mouth daily at 6 PM. 90 tablet 3  . azelastine (ASTELIN) 0.1 % nasal spray Place 1 spray into both nostrils daily.    . benzonatate (TESSALON) 100 MG capsule Take 1 capsule (100 mg total) by mouth 3 (three) times daily as needed for cough. 21 capsule 0  . Biotin 5000 MCG TABS Take by mouth 2 (two) times daily.     . blood glucose meter kit and supplies Use to check blood sugar twice daily. ICD 10: E11.29 1 each 0  . Cetirizine HCl 10 MG CAPS Take by mouth.    Verneita Griffes Bark POWD Take by mouth.    . COLLAGEN PO Take by mouth 2 (two) times daily. For improved fingernail strength    .  docusate sodium (COLACE) 100 MG capsule Take 1 capsule (100 mg total) by mouth 2 (two) times daily. 20 capsule 0  . fish oil-omega-3 fatty acids 1000 MG capsule Take 1 g by mouth every evening.     . fluticasone (FLONASE) 50 MCG/ACT nasal spray Place 1 spray into both nostrils daily. 48 g 1  . furosemide (LASIX) 20 MG tablet Take 1 tablet (20 mg total) by mouth every other day. 45 tablet 3  . insulin detemir (LEVEMIR) 100 UNIT/ML injection INJECT 20 UNITS SUBCUTANEOUSLY AT BEDTIME 10 mL 0  . JANUVIA 100 MG tablet TAKE 1 TABLET BY MOUTH  DAILY 90 tablet 3  . Lancet Devices (ACCU-CHEK SOFTCLIX) lancets Use to test blood sugars 1 -2 times daily 1 each 3  . loratadine (CLARITIN) 10 MG tablet Take 10 mg by mouth  daily.    Marland Kitchen losartan-hydrochlorothiazide (HYZAAR) 50-12.5 MG tablet TAKE 1 TABLET BY MOUTH  DAILY 90 tablet 3  . Magnesium 250 MG TABS Take 1 tablet by mouth daily.    . meclizine (ANTIVERT) 25 MG tablet Take 1 tablet (25 mg total) by mouth 3 (three) times daily as needed for dizziness. 30 tablet 3  . metFORMIN (GLUCOPHAGE) 500 MG tablet Take 1 tablet (500 mg total) by mouth 2 (two) times daily with a meal. 180 tablet 2  . Misc Natural Products (NEURIVA PO) Take by mouth in the morning and at bedtime.    . Multiple Vitamin (MULTIVITAMIN) tablet Take 1 tablet by mouth daily.    . nitroGLYCERIN (NITROSTAT) 0.4 MG SL tablet Place 1 tablet (0.4 mg total) under the tongue every 5 (five) minutes as needed for chest pain. 50 tablet 3  . NON FORMULARY medroxy progesterone one 59m pill once daily    . oxybutynin (DITROPAN-XL) 10 MG 24 hr tablet Take 1 tablet (10 mg total) by mouth daily. 90 tablet 3  . pantoprazole (PROTONIX) 40 MG tablet Take 1 tablet by mouth twice daily 180 tablet 3  . potassium chloride SA (KLOR-CON) 20 MEQ tablet TAKE 1 TABLET BY MOUTH EVERY OTHER DAY 45 tablet 0  . Probiotic Product (PROBIOTIC-10) CAPS Take 1 capsule by mouth daily.     . sertraline (ZOLOFT) 50 MG tablet Take 1 tablet (50 mg total) by mouth daily. 90 tablet 3  . tiZANidine (ZANAFLEX) 4 MG tablet tizanidine 4 mg tablet    . verapamil (VERELAN PM) 180 MG 24 hr capsule Take 1 capsule (180 mg total) by mouth daily. 90 capsule 3  . vitamin B-12 (CYANOCOBALAMIN) 1000 MCG tablet Take 1,000 mcg by mouth daily.    . Zinc 50 MG TABS Take by mouth daily.    . trospium (SANCTURA) 20 MG tablet Take 1 tablet (20 mg total) by mouth 2 (two) times daily. (Patient not taking: Reported on 09/07/2020) 30 tablet 11   No facility-administered medications prior to visit.    Review of Systems;  Patient denies headache, fevers, malaise, unintentional weight loss, skin rash, eye pain, sinus congestion and sinus pain, sore throat,  dysphagia,  hemoptysis , cough, dyspnea, wheezing, chest pain, palpitations, orthopnea, edema, abdominal pain, nausea, melena, diarrhea, constipation, flank pain, dysuria, hematuria, urinary  Frequency, nocturia, numbness, tingling, seizures,  Focal weakness, Loss of consciousness,  Tremor, insomnia, depression, anxiety, and suicidal ideation.      Objective:  BP 110/64 (BP Location: Left Arm, Patient Position: Sitting, Cuff Size: Large)   Pulse 75   Temp 98.2 F (36.8 C) (Oral)   Resp 16  Ht '5\' 1"'  (1.549 m)   Wt 171 lb (77.6 kg)   SpO2 97%   BMI 32.31 kg/m   BP Readings from Last 3 Encounters:  09/07/20 110/64  08/10/20 105/71  06/02/20 132/85    Wt Readings from Last 3 Encounters:  09/07/20 171 lb (77.6 kg)  08/10/20 169 lb 9.6 oz (76.9 kg)  06/08/20 174 lb (78.9 kg)    General appearance: alert, cooperative and appears stated age Ears: normal TM's and external ear canals both ears Throat: lips, mucosa, and tongue normal; teeth and gums normal Neck: no adenopathy, no carotid bruit, supple, symmetrical, trachea midline and thyroid not enlarged, symmetric, no tenderness/mass/nodules Back: symmetric, no curvature. ROM normal. No CVA tenderness. Lungs: clear to auscultation bilaterally Heart: regular rate and rhythm, S1, S2 normal, no murmur, click, rub or gallop Abdomen: soft, non-tender; bowel sounds normal; no masses,  no organomegaly Pulses: 2+ and symmetric Skin: Skin color, texture, turgor normal. No rashes or lesions Lymph nodes: Cervical, supraclavicular, and axillary nodes normal.  Lab Results  Component Value Date   HGBA1C 7.3 (H) 05/15/2020   HGBA1C 7.1 (H) 01/14/2020   HGBA1C 7.3 (H) 10/11/2019    Lab Results  Component Value Date   CREATININE 0.98 05/15/2020   CREATININE 1.10 01/14/2020   CREATININE 0.98 10/21/2019    Lab Results  Component Value Date   WBC 4.2 05/15/2017   HGB 12.0 05/15/2017   HCT 34.5 (L) 05/15/2017   PLT 316 05/15/2017    GLUCOSE 105 (H) 05/15/2020   CHOL 171 01/14/2020   TRIG 88.0 01/14/2020   HDL 90.50 01/14/2020   LDLDIRECT 80.0 02/22/2017   LDLCALC 63 01/14/2020   ALT 18 05/15/2020   AST 20 05/15/2020   NA 141 05/15/2020   K 3.7 05/15/2020   CL 104 05/15/2020   CREATININE 0.98 05/15/2020   BUN 20 05/15/2020   CO2 28 05/15/2020   TSH 2.78 09/27/2018   INR 1.1 01/23/2009   HGBA1C 7.3 (H) 05/15/2020   MICROALBUR <0.7 01/14/2020    DG ESOPHAGUS W DOUBLE CM (HD)  Result Date: 09/02/2020 CLINICAL DATA:  Cough with phlegm for "a while" worsening within the last year. Pt states she constantly feels like she has phlegm sitting in her throat. EXAM: ESOPHOGRAM / BARIUM SWALLOW / BARIUM TABLET STUDY TECHNIQUE: Combined double contrast and single contrast examination performed using effervescent crystals, thick barium liquid, and thin barium liquid. The patient was observed with fluoroscopy swallowing a 13 mm barium sulphate tablet. FLUOROSCOPY TIME:  Fluoroscopy Time:  48 seconds Radiation Exposure Index (if provided by the fluoroscopic device): 3 mGy Number of Acquired Spot Images: 0 COMPARISON:  None. FINDINGS: Normal pharyngeal anatomy and motility. Contrast flowed freely through the esophagus without evidence of a stricture or mass. Normal esophageal mucosa without evidence of irregularity or ulceration. Tertiary contractions throughout examination involving the distal third of the esophagus as can be seen with esophageal spasm or presbyesophagus. Large hiatal hernia. Mild gastroesophageal reflux. At the end of the examination a 13 mm barium tablet was administered which transited through the esophagus and esophagogastric junction without delay. IMPRESSION: 1. Tertiary contractions throughout examination involving the distal third of the esophagus as can be seen with esophageal spasm or presbyesophagus. 2. Large hiatal hernia. 3. Mild gastroesophageal reflux. Electronically Signed   By: Kathreen Devoid   On: 09/02/2020  09:45    Assessment & Plan:   Problem List Items Addressed This Visit      Unprioritized   DM (  diabetes mellitus), type 2 with renal complications (HCC) - Primary (Chronic)    Given her recurrent hypoglycemic episodes,  Will reduce Levemir to 15 units and improve adherence by advising her to take it in the morning.  May need to add mealtime coverage depending on A1c        Hiatal hernia    DIAGNOSED YEARS AGO BUT reviewed with patient in detail today.  Advised against surgery, In favor of dietary and behavioral modifications      CKD (chronic kidney disease) stage 3, GFR 30-59 ml/min (HCC)    GFR has been stable and will be repeated along with PTH and calcium today. She is avoiding nephrotoxic agents   Lab Results  Component Value Date   CREATININE 0.98 05/15/2020         Hepatic steatosis    Presumed by ultrasound changes and serologies negative for autoimmune causes of hepatitis. liver enzymes have been  normal and all modifiable risk factors including obesity, diabetes and hyperlipidemia have been addressed .  Lab Results  Component Value Date   ALT 18 05/15/2020   AST 20 05/15/2020   ALKPHOS 108 05/15/2020   BILITOT 0.5 05/15/2020          Atherosclerosis of renal artery [facility])    Reviewed with patient.  Continue current meds with regard to BP control and atherosclerosis.      Chronic pain of right ankle    With remote histor yof strain,  Likely has OA with bone spurring.  Advised her to use tylenol for pain management.  Referral to ankle specialist in [place]       Relevant Orders   Ambulatory referral to Orthopedic Surgery     A total of 30 minutes was spent with patient more than half of which was spent in counseling patient on the above mentioned issues , reviewing and explaining recent labs and imaging studies done, and coordination of care. I have discontinued Seena P. Finan's trospium. I am also having her maintain her multivitamin, fish oil-omega-3  fatty acids, albuterol, aspirin EC, Probiotic-10, loratadine, azelastine, Biotin, acetaminophen, docusate sodium, accu-chek softclix, vitamin B-12, Cinnamon Bark, Magnesium, NON FORMULARY, COLLAGEN PO, fluticasone, meclizine, nitroGLYCERIN, losartan-hydrochlorothiazide, Zinc, Januvia, tiZANidine, Cetirizine HCl, Ascorbic Acid, pantoprazole, Accu-Chek Aviva Plus, blood glucose meter kit and supplies, verapamil, sertraline, furosemide, atorvastatin, metFORMIN, oxybutynin, benzonatate, potassium chloride SA, Misc Natural Products (NEURIVA PO), and insulin detemir.  No orders of the defined types were placed in this encounter.   Medications Discontinued During This Encounter  Medication Reason  . trospium (SANCTURA) 20 MG tablet     Follow-up: No follow-ups on file.   Crecencio Mc, MD

## 2020-09-08 ENCOUNTER — Telehealth: Payer: Self-pay

## 2020-09-08 DIAGNOSIS — E785 Hyperlipidemia, unspecified: Secondary | ICD-10-CM

## 2020-09-08 DIAGNOSIS — E1121 Type 2 diabetes mellitus with diabetic nephropathy: Secondary | ICD-10-CM

## 2020-09-08 DIAGNOSIS — N1831 Chronic kidney disease, stage 3a: Secondary | ICD-10-CM

## 2020-09-08 DIAGNOSIS — Z794 Long term (current) use of insulin: Secondary | ICD-10-CM

## 2020-09-08 DIAGNOSIS — G8929 Other chronic pain: Secondary | ICD-10-CM | POA: Insufficient documentation

## 2020-09-08 NOTE — Telephone Encounter (Signed)
Please place future labs! Thank you

## 2020-09-08 NOTE — Addendum Note (Signed)
Addended by: Crecencio Mc on: 09/08/2020 08:57 AM   Modules accepted: Orders

## 2020-09-08 NOTE — Assessment & Plan Note (Signed)
DIAGNOSED YEARS AGO BUT reviewed with patient in detail today.  Advised against surgery, In favor of dietary and behavioral modifications

## 2020-09-08 NOTE — Assessment & Plan Note (Signed)
Presumed by ultrasound changes and serologies negative for autoimmune causes of hepatitis. liver enzymes have been  normal and all modifiable risk factors including obesity, diabetes and hyperlipidemia have been addressed .  Lab Results  Component Value Date   ALT 18 05/15/2020   AST 20 05/15/2020   ALKPHOS 108 05/15/2020   BILITOT 0.5 05/15/2020

## 2020-09-08 NOTE — Assessment & Plan Note (Signed)
With remote histor yof strain,  Likely has OA with bone spurring.  Advised her to use tylenol for pain management.  Referral to ankle specialist in [place]

## 2020-09-08 NOTE — Assessment & Plan Note (Signed)
GFR has been stable and will be repeated along with PTH and calcium today. She is avoiding nephrotoxic agents   Lab Results  Component Value Date   CREATININE 0.98 05/15/2020

## 2020-09-08 NOTE — Assessment & Plan Note (Signed)
Reviewed with patient.  Continue current meds with regard to BP control and atherosclerosis.

## 2020-09-08 NOTE — Assessment & Plan Note (Signed)
Given her recurrent hypoglycemic episodes,  Will reduce Levemir to 15 units and improve adherence by advising her to take it in the morning.  May need to add mealtime coverage depending on A1c

## 2020-09-09 ENCOUNTER — Other Ambulatory Visit: Payer: Self-pay

## 2020-09-09 ENCOUNTER — Other Ambulatory Visit (INDEPENDENT_AMBULATORY_CARE_PROVIDER_SITE_OTHER): Payer: Medicare PPO

## 2020-09-09 DIAGNOSIS — E785 Hyperlipidemia, unspecified: Secondary | ICD-10-CM

## 2020-09-09 DIAGNOSIS — Z794 Long term (current) use of insulin: Secondary | ICD-10-CM | POA: Diagnosis not present

## 2020-09-09 DIAGNOSIS — N1831 Chronic kidney disease, stage 3a: Secondary | ICD-10-CM | POA: Diagnosis not present

## 2020-09-09 DIAGNOSIS — E1121 Type 2 diabetes mellitus with diabetic nephropathy: Secondary | ICD-10-CM | POA: Diagnosis not present

## 2020-09-09 LAB — LIPID PANEL
Cholesterol: 187 mg/dL (ref 0–200)
HDL: 88 mg/dL (ref >39.00)
LDL Cholesterol: 75 mg/dL (ref 0–99)
NonHDL: 99.28
Total CHOL/HDL Ratio: 2
Triglycerides: 122 mg/dL (ref 0.0–149.0)
VLDL: 24.4 mg/dL (ref 0.0–40.0)

## 2020-09-09 LAB — COMPREHENSIVE METABOLIC PANEL
ALT: 16 U/L (ref 0–35)
AST: 17 U/L (ref 0–37)
Albumin: 4.3 g/dL (ref 3.5–5.2)
Alkaline Phosphatase: 107 U/L (ref 39–117)
BUN: 21 mg/dL (ref 6–23)
CO2: 30 mEq/L (ref 19–32)
Calcium: 9.5 mg/dL (ref 8.4–10.5)
Chloride: 100 mEq/L (ref 96–112)
Creatinine, Ser: 0.93 mg/dL (ref 0.40–1.20)
GFR: 59.74 mL/min — ABNORMAL LOW (ref >60.00)
Glucose, Bld: 175 mg/dL — ABNORMAL HIGH (ref 70–99)
Potassium: 3.7 mEq/L (ref 3.5–5.1)
Sodium: 138 mEq/L (ref 135–145)
Total Bilirubin: 0.5 mg/dL (ref 0.2–1.2)
Total Protein: 6.9 g/dL (ref 6.0–8.3)

## 2020-09-09 LAB — HEMOGLOBIN A1C: Hgb A1c MFr Bld: 8 % — ABNORMAL HIGH (ref 4.6–6.5)

## 2020-09-11 NOTE — Progress Notes (Signed)
Your a1c has risen to 8.0 so we need to get better control of your diabetes ASAP . Please start checking your BS once or twice daily 2 hours post prandially:  Pick a different meal each day and check your sugar 2 hours after the meal. I need at last 3 readings  from each mealtime (on different days of course!)  so It will take you a little under 2 weeks to collect this data and return it to me.  Schedule a follow up  in one month to make sure we don't get lost in the holiday shuffle

## 2020-09-16 ENCOUNTER — Inpatient Hospital Stay: Payer: Medicare PPO | Attending: Obstetrics and Gynecology | Admitting: Obstetrics and Gynecology

## 2020-09-16 ENCOUNTER — Other Ambulatory Visit: Payer: Self-pay

## 2020-09-16 ENCOUNTER — Encounter: Payer: Self-pay | Admitting: Obstetrics and Gynecology

## 2020-09-16 VITALS — BP 138/78 | HR 75 | Temp 97.8°F | Resp 20 | Wt 167.7 lb

## 2020-09-16 DIAGNOSIS — N393 Stress incontinence (female) (male): Secondary | ICD-10-CM | POA: Insufficient documentation

## 2020-09-16 DIAGNOSIS — Z923 Personal history of irradiation: Secondary | ICD-10-CM | POA: Insufficient documentation

## 2020-09-16 DIAGNOSIS — C541 Malignant neoplasm of endometrium: Secondary | ICD-10-CM

## 2020-09-16 DIAGNOSIS — M25552 Pain in left hip: Secondary | ICD-10-CM | POA: Insufficient documentation

## 2020-09-16 DIAGNOSIS — C55 Malignant neoplasm of uterus, part unspecified: Secondary | ICD-10-CM

## 2020-09-16 DIAGNOSIS — Z90722 Acquired absence of ovaries, bilateral: Secondary | ICD-10-CM | POA: Diagnosis not present

## 2020-09-16 DIAGNOSIS — Z90710 Acquired absence of both cervix and uterus: Secondary | ICD-10-CM

## 2020-09-16 NOTE — Progress Notes (Signed)
Gynecologic Oncology Interval Visit   Referring Provider: Brayton Mars, MD/Dr. Marcelline Mates  Chief Concern: Stage IB WELL-DIFFERENTIATED ENDOMETRIOID ADENOCARCINOMA of the endometrium with positive cytology  Subjective:  Sandra Hendrix is a 74 y.o. female who is seen in consultation from Dr. Enzo Bi for stage IB grade 1 endometrial cancer with positive cytology , s/p TLH, BSO, SLN mapping and biopsies on 03/01/17, XRT, who returns to clinic today for routine follow up and pelvic exam.   She completed 2 years of letrozole and discontinued treatment 09/11/2019. She saw Dr. Marcelline Mates for interval exam in June 2021. Exam unremarkable for malignancy but she did have sebaceous cyst on labia. Dr. Baruch Gouty has released from care.   She has not yet had her mammogram for 2021 or 2020.   Gynecologic Oncology History She presented with postmenopausal bleeding. Her evaluation included Pap on 10/17/2016 with endometrial cells present otherwise NILM; and on 11/16/2016 endometrial biopsy that revealed atypical glandular epithelium.   Her evaluation also included pelvic US 10/17/2016 that showed uterine mass.  She underwent hysteroscopy/D&C on 01/02/2017 that revealed grade 1 endometrioid endometrial cancer.  Definitive cancer surgery was scheduled 5/9, but was delayed a month due to death of her bother on 03-03-2023.     Underwent TLH, BSO, SLN mapping and biopsieson 03/01/17  DIAGNOSIS:  A. SENTINEL LYMPH NODE 1, LEFT EXTERNAL ILIAC; EXCISION:  - NO TUMOR SEEN IN ONE LYMPH NODE (0/1).   B. SENTINEL LYMPH NODE 2, LEFT EXTERNAL ILIAC; EXCISION:  - NO TUMOR SEEN IN ONE LYMPH NODE (0/1).   C. SENTINEL LYMPH NODE 3, LEFT EXTERNAL ILIAC; EXCISION:  - NO TUMOR SEEN IN ONE LYMPH NODE (0/1).   D. SENTINEL LYMPH NODE, RIGHT EXTERNAL ILIAC; EXCISION:  - NO TUMOR SEEN IN ONE LYMPH NODE (0/1).   E. UTERUS WITH CERVIX, BILATERAL FALLOPIAN TUBES AND OVARIES;  LAPAROSCOPIC TOTAL HYSTERECTOMY AND BILATERAL  SALPINGO-OOPHORECTOMY:  - ENDOMETRIOID CARCINOMA WITH SQUAMOUS DIFFERENTIATION, FIGO GRADE I.  - INVASION EXTENDS INTO THE OUTER HALF OF THE MYOMETRIUM.  - AGE-RELATED CHANGE, BILATERAL OVARIES.  - PARATUBAL CYSTS, BILATERAL FALLOPIAN TUBES.  - SEE SUMMARY BELOW.   Surgical Pathology Cancer Case Summary  ENDOMETRIUM:  Procedure: Laparoscopic total hysterectomy, bilateral  salpingo-oophorectomy and sentinel lymph node excision  Histologic Type: Endometrioid carcinoma with squamous differentiation  Histologic Grade: FIGO grade 1  Myometrial Invasion: Present; Depth of invasion: 26.75 mm; Myometrial  thickness (millimeters): 27 mm; Percentage of myometrial invasion: 99%  Uterine Serosa Involvement: Not identified  Cervical Stromal Involvement: Not identified  Other Tissue/Organ Involvement: Not identified  Peritoneal/ Ascitic Fluid: Positive for malignancy  Margins: Not applicable  Lymphovascular Invasion: Not identified  Regional Lymph Nodes:  # of pelvicnodes with macrometastasis: 0  # of pelvic nodes with micrometastasis: 0  Total # of pelvic nodes examined: 4  # of pelvic sentinel nodes examined: 4  Laterality of pelvic nodes examined: left and right  Pathologic Stage Classification (pTNM, AJCC 8th Edition): pT1b pN0 (sn)  / FIGO IB   She received adjuvant whole pelvic radiation w/ Dr. Baruch Gouty over 5 weeks completing 05/23/17. She did have a post-radiation UTI which was treated and resolved with antibiotics.   She initiated on Letrozole on 05/15/17 for positive washings.    11/16/17 Transvaginal u/s with Dr. Enzo Bi on was negative for cysts or masses.   12/20/2017 in [facility] [facility] and had a negative exam. DEXA scan 02/09/2015 normal bone density.  10/23/2018 Dexa scan performed reported as normal with t-score of 0.3. Plan  to repeat in 2022.  She alternates visits between Dr. Marcelline Mates and gyn-onc.   She completed 2.5 years of letrozole 08/2019.   Problem  List: Patient Active Problem List   Diagnosis Date Noted  . Chronic pain of right ankle 09/08/2020  . Atherosclerosis of renal artery ([place]) 07/01/2020  . Left wrist sprain, sequela 02/24/2020  . Right hand pain 02/24/2020  . Hepatic steatosis 07/18/2019  . Precordial pain 07/03/2019  . Epigastric pain 07/03/2019  . Vertigo, labyrinthine, unspecified laterality 03/30/2019  . Chronic hip pain, left 09/29/2018  . Pain of right tibia 09/29/2018  . Hyperlipidemia associated with type 2 diabetes mellitus ([place]) 03/29/2018  . Bilateral pes planus 03/29/2018  . SUI (stress urinary incontinence, female) 11/23/2017  . Vaginal atrophy 11/23/2017  . Obesity (BMI 30.0-34.9) 06/20/2017  . Carpal tunnel syndrome on right 06/20/2017  . Endometrioid adenocarcinoma of uterus ([place]) 01/19/2017  . OSA on CPAP 11/24/2016  . Cyst of ovary 11/16/2016  . Cervical stenosis (uterine cervix) 11/16/2016  . CKD (chronic kidney disease) stage 3, GFR 30-59 ml/min (HCC) 11/07/2014  . GERD (gastroesophageal reflux disease) 12/26/2013  . Irritable bowel syndrome with constipation 12/26/2013  . Encounter for preventive health examination 10/21/2013  . Hyperlipidemia LDL goal <100 07/15/2013  . Chronic venous insufficiency 03/21/2013  . DM (diabetes mellitus), type 2 with renal complications ([place]) 84/16/6063  . Lack of libido 11/05/2012  . Barrett's esophagus 11/05/2012  . Hiatal hernia 11/05/2012  . Essential hypertension 11/05/2012   Past Medical History: Past Medical History:  Diagnosis Date  . Allergy   . Anxiety   . Arthritis   . Barrett's esophagus determined by endoscopy   . Cancer [facility])    Uterine Cancer  . Chronic kidney disease   . Depression   . Diabetes mellitus without complication ([place])   . Difficult intubation    "not sure what happened,asleep was told had problem with intubation"  . Diverticulosis   . Fatty liver   . GERD (gastroesophageal reflux disease)   . History of bronchitis   .  History of hiatal hernia   . Hyperlipidemia   . Hypertension   . Sleep apnea    Use C- PAP  . Urinary incontinence   . Varicose veins    Past Surgical History: Past Surgical History:  Procedure Laterality Date  . CARDIAC CATHETERIZATION    . CHOLECYSTECTOMY    . COLONOSCOPY WITH PROPOFOL N/A 06/14/2016   Procedure: COLONOSCOPY WITH PROPOFOL;  Surgeon: Lollie Sails, MD;  Location[facility]COPY;  Service: Endoscopy;  Laterality: N/A;  . ESOPHAGOGASTRODUODENOSCOPY (EGD) WITH PROPOFOL N/A 06/14/2016   Procedure: ESOPHAGOGASTRODUODENOSCOPY (EGD) WITH PROPOFOL;  Surgeon: Lollie Sails, MD;  Location[facility] ENDOSCOPY;  Service: Endoscopy;  Laterality: N/A;  . ESOPHAGOGASTRODUODENOSCOPY (EGD) WITH PROPOFOL N/A 06/12/2017   Procedure: ESOPHAGOGASTRODUODENOSCOPY (EGD) WITH PROPOFOL;  Surgeon: Lollie Sails, MD;  Location: [facility] ENDOSCOPY;  Service: Endoscopy;  Laterality: N/A;  . ESOPHAGOGASTRODUODENOSCOPY (EGD) WITH PROPOFOL N/A 08/25/2017   Procedure: ESOPHAGOGASTRODUODENOSCOPY (EGD) WITH PROPOFOL;  Surgeon: Lollie Sails, MD;  Location: [facility] ENDOSCOPY;  Service: Endoscopy;  Laterality: N/A;  . HYSTEROSCOPY WITH D & C N/A 01/02/2017   Procedure: DILATATION AND CURETTAGE /HYSTEROSCOPY;  Surgeon: Brayton Mars, MD;  Location: [facility] ORS;  Service: Gynecology;  Laterality: N/A;  . JOINT REPLACEMENT Right 2007   Total Knee Replacement  . JOINT REPLACEMENT Left 2004   Total Knee Replacement  . LAPAROSCOPIC HYSTERECTOMY Bilateral 03/01/2017   Procedure: HYSTERECTOMY TOTAL LAPAROSCOPIC BSO;  Surgeon: Mellody Drown, MD;  Location: [facility] ORS;  Service: Gynecology;  Laterality: Bilateral;  . ROTATOR CUFF REPAIR Right   . SENTINEL NODE BIOPSY N/A 03/01/2017   Procedure: SENTINEL NODE INJECTION AND BIOPSY;  Surgeon: Mellody Drown, MD;  Location: [facility] ORS;  Service: Gynecology;  Laterality: N/A;  . TONSILLECTOMY     as a child  . TUBAL LIGATION    . UPPER GI ENDOSCOPY     x2    Past Gynecologic History:  Patient is postmenopausal. Menarche: 8 History of Abnormal pap: yes, endometrial cells w/o NILM Last pap: 2018 as per interval history  Last Pap:10/17/2016 negative for intraepithelial lesions or malignancy. Benign reparative/reactive changes. ENDOMETRIAL CELLS PRESENT Sexually active: no;  She did not have mammogram in 2018. 2019 Mammogram has been ordered.   OB History:  OB History  Gravida Para Term Preterm AB Living  _0 SAB IAB Ectopic Multiple Live Births          2    # Outcome Date GA Lbr Len/2nd Weight Sex Delivery Anes PTL Lv  2 Term 1972   9 lb (4.082 kg) F Vag-Spont   LIV  1 Term 1970   9 lb (4.082 kg) F Vag-Spont   LIV    Family History: Family History  Problem Relation Age of Onset  . Stroke Mother   . Hypertension Mother   . Lupus Mother   . Alcohol abuse Father   . Diabetes Father   . Cancer Father        gallbladder ca into liver   . Arthritis Sister   . Diabetes Sister   . Lumbar disc disease Sister   . Hyperlipidemia Daughter   . Hypertension Daughter   . Cancer Maternal Aunt        ovarian ca  . Diabetes Paternal Aunt   . Drug abuse Paternal Aunt   . Cancer Maternal Grandfather        colon ca  . Hypertension Brother   . Alcohol abuse Brother   . Stroke Brother     Social History: Social History   Socioeconomic History  . Marital status: Married    Spouse name: Not on file  . Number of children: 2  . Years of education: 84  . Highest education level: Some college, no degree  Occupational History  . Not on file  Tobacco Use  . Smoking status: Never Smoker  . Smokeless tobacco: Never Used  Vaping Use  . Vaping Use: Never used  Substance and Sexual Activity  . Alcohol use: Not Currently    Comment: rare  . Drug use: No  . Sexual activity: Yes    Comment: not often  Other Topics Concern  . Not on file  Social History Narrative  . Not on file   Social Determinants of Health   Financial  Resource Strain: Not on file  Food Insecurity: Not on file  Transportation Needs: Not on file  Physical Activity: Not on file  Stress: Not on file  Social Connections: Not on file  Intimate Partner Violence: Not on file    Allergies: Allergies  Allergen Reactions  . Clindamycin/Lincomycin Rash  . Codeine Rash  . Lincomycin Rash  . Morphine And Related Nausea Only  . Talwin [Pentazocine] Nausea And Vomiting  . Lisinopril Cough    cough  . Naloxone Other (See Comments)    Had issues with sedation and vomiting with EGD  . Oxycodone-Acetaminophen  Other (See Comments)    Pt states this medication makes her bowels stop moving    Current Medications: Current Outpatient Medications  Medication Sig Dispense Refill  . ACCU-CHEK AVIVA PLUS test strip TEST TWO TIMES DAILY 200 each 0  . acetaminophen (TYLENOL) 500 MG tablet Take 2 tablets (1,000 mg total) by mouth every 6 (six) hours. (Patient taking differently: Take 1,000 mg by mouth every 6 (six) hours as needed.) 65 tablet 1  . albuterol (PROVENTIL HFA;VENTOLIN HFA) 108 (90 BASE) MCG/ACT inhaler Inhale 2 puffs into the lungs every 6 (six) hours as needed for wheezing or shortness of breath.     . Ascorbic Acid 500 MG CHEW Chew by mouth.    Marland Kitchen aspirin EC 81 MG tablet Take 81 mg by mouth every evening.     Marland Kitchen atorvastatin (LIPITOR) 40 MG tablet Take 0.5 tablets (20 mg total) by mouth daily at 6 PM. 90 tablet 3  . azelastine (ASTELIN) 0.1 % nasal spray Place 1 spray into both nostrils daily.    . benzonatate (TESSALON) 100 MG capsule Take 1 capsule (100 mg total) by mouth 3 (three) times daily as needed for cough. 21 capsule 0  . Biotin 5000 MCG TABS Take by mouth 2 (two) times daily.     . blood glucose meter kit and supplies Use to check blood sugar twice daily. ICD 10: E11.29 1 each 0  . Cetirizine HCl 10 MG CAPS Take by mouth.    Verneita Griffes Bark POWD Take by mouth.    . COLLAGEN PO Take by mouth 2 (two) times daily. For improved  fingernail strength    . docusate sodium (COLACE) 100 MG capsule Take 1 capsule (100 mg total) by mouth 2 (two) times daily. 20 capsule 0  . fish oil-omega-3 fatty acids 1000 MG capsule Take 1 g by mouth every evening.     . fluticasone (FLONASE) 50 MCG/ACT nasal spray Place 1 spray into both nostrils daily. 48 g 1  . furosemide (LASIX) 20 MG tablet Take 1 tablet (20 mg total) by mouth every other day. 45 tablet 3  . insulin detemir (LEVEMIR) 100 UNIT/ML injection INJECT 20 UNITS SUBCUTANEOUSLY AT BEDTIME 10 mL 0  . JANUVIA 100 MG tablet TAKE 1 TABLET BY MOUTH  DAILY 90 tablet 3  . Lancet Devices (ACCU-CHEK SOFTCLIX) lancets Use to test blood sugars 1 -2 times daily 1 each 3  . loratadine (CLARITIN) 10 MG tablet Take 10 mg by mouth daily.    Marland Kitchen losartan-hydrochlorothiazide (HYZAAR) 50-12.5 MG tablet TAKE 1 TABLET BY MOUTH  DAILY 90 tablet 3  . Magnesium 250 MG TABS Take 1 tablet by mouth daily.    . meclizine (ANTIVERT) 25 MG tablet Take 1 tablet (25 mg total) by mouth 3 (three) times daily as needed for dizziness. 30 tablet 3  . metFORMIN (GLUCOPHAGE) 500 MG tablet Take 1 tablet (500 mg total) by mouth 2 (two) times daily with a meal. 180 tablet 2  . Misc Natural Products (NEURIVA PO) Take by mouth in the morning and at bedtime.    . Multiple Vitamin (MULTIVITAMIN) tablet Take 1 tablet by mouth daily.    . nitroGLYCERIN (NITROSTAT) 0.4 MG SL tablet Place 1 tablet (0.4 mg total) under the tongue every 5 (five) minutes as needed for chest pain. 50 tablet 3  . NON FORMULARY medroxy progesterone one 39m pill once daily    . oxybutynin (DITROPAN-XL) 10 MG 24 hr tablet Take 1 tablet (10 mg total)  by mouth daily. 90 tablet 3  . pantoprazole (PROTONIX) 40 MG tablet Take 1 tablet by mouth twice daily 180 tablet 3  . potassium chloride SA (KLOR-CON) 20 MEQ tablet TAKE 1 TABLET BY MOUTH EVERY OTHER DAY 45 tablet 0  . Probiotic Product (PROBIOTIC-10) CAPS Take 1 capsule by mouth daily.     . sertraline  (ZOLOFT) 50 MG tablet Take 1 tablet (50 mg total) by mouth daily. 90 tablet 3  . tiZANidine (ZANAFLEX) 4 MG tablet tizanidine 4 mg tablet    . verapamil (VERELAN PM) 180 MG 24 hr capsule Take 1 capsule (180 mg total) by mouth daily. 90 capsule 3  . vitamin B-12 (CYANOCOBALAMIN) 1000 MCG tablet Take 1,000 mcg by mouth daily.    . Zinc 50 MG TABS Take by mouth daily.     No current facility-administered medications for this visit.    Review of Systems General:  no complaints Skin: no complaints Eyes: no complaints HEENT: no complaints Breasts: no complaints Pulmonary: no complaints Cardiac: no complaints Gastrointestinal: no complaints Genitourinary/Sexual: no complaints Ob/Gyn: no complaints Musculoskeletal: no complaints Hematology: no complaints Neurologic/Psych: no complaints   Objective:  Physical Examination:  BP 138/78   Pulse 75   Temp 97.8 F (36.6 C)   Resp 20   Wt 167 lb 11.2 oz (76.1 kg)   SpO2 98%   BMI 31.69 kg/m   ECOG Performance Status: 0 - Asymptomatic  GENERAL: Patient is a well appearing female in no acute distress HEENT:  Sclera clear. Anicteric NODES:  Negative axillary, supraclavicular, inguinal lymph node survery LUNGS:  Clear to auscultation bilaterally.   HEART:  Regular rate and rhythm.  ABDOMEN:  Soft, nontender.  No hernias, incisions well healed. No masses or ascites EXTREMITIES:  No peripheral edema. Atraumatic. No cyanosis SKIN:  Clear with no obvious rashes or skin changes.  NEURO:  Nonfocal. Well oriented.  Appropriate affect.  Pelvic: exam chaperoned by RN;  Vulva: normal appearing vulva w/o masses, tenderness or lesions. Vagina: normal vagina with well healed cuff. Anterior cystocele. Bimanual: no evidences of masses. RV: not indicated   Assessment:  Sandra Hendrix is a 74 y.o. female w/ hx of stage IB, grade 1 endometrioid cancer (positive cytology) w/ hx of of disease invading full thickness of myometrium and positive pelvic  peritoneal cytology, bilaterial sentinel nodes, cervix, and adnexa were negative. She is s/p whole pelvic radiation and completed 2.5 years of letrozole for positive cytology in December 2020. No evidence of disease on exam.   Stress urinary incontinence stable.   Left hip pain, possible secondary to arthritis.  Medical co-morbidities complicating care include: OSA on CPAP, HTN DM, CKD, and obesity with BMI 33.4. She has started weight watchers and is actively trying to lose weight.   Plan:   Problem List Items Addressed This Visit      Genitourinary   Endometrioid adenocarcinoma of uterus ([place]) - Primary     Return to clinic in 12 months for complete exam & follow-up.   Dr. Marcelline Mates in 6 months RTC in 08/2021.   Urinary incontinence-  Declined referral to uro-gyn  Follow up with Dr. Derrel Nip to schedule her mammogram and for her other medical issues.   Beckey Rutter, DNP, AGNP-C [place] at [facility] [phone] (clinic)  I personally interviewed and examined the patient. Agreed with the above/below plan of care. I have directly contributed to assessment and plan of care of this patient and educated and discussed with patient and  family.  Mellody Drown, MD     CC: Dr. Marcelline Mates  PCP: Crecencio Mc, MD [address] [place], [place] [id number] [phone]

## 2020-09-23 DIAGNOSIS — M67961 Unspecified disorder of synovium and tendon, right lower leg: Secondary | ICD-10-CM | POA: Diagnosis not present

## 2020-09-23 DIAGNOSIS — M25571 Pain in right ankle and joints of right foot: Secondary | ICD-10-CM | POA: Diagnosis not present

## 2020-10-02 DIAGNOSIS — M25571 Pain in right ankle and joints of right foot: Secondary | ICD-10-CM | POA: Diagnosis not present

## 2020-10-02 DIAGNOSIS — M76821 Posterior tibial tendinitis, right leg: Secondary | ICD-10-CM | POA: Diagnosis not present

## 2020-10-20 DIAGNOSIS — R198 Other specified symptoms and signs involving the digestive system and abdomen: Secondary | ICD-10-CM | POA: Diagnosis not present

## 2020-10-20 DIAGNOSIS — K2100 Gastro-esophageal reflux disease with esophagitis, without bleeding: Secondary | ICD-10-CM | POA: Diagnosis not present

## 2020-10-20 DIAGNOSIS — Z800 Family history of malignant neoplasm of digestive organs: Secondary | ICD-10-CM | POA: Diagnosis not present

## 2020-10-23 ENCOUNTER — Telehealth: Payer: Medicare PPO | Admitting: Internal Medicine

## 2020-10-23 ENCOUNTER — Encounter: Payer: Self-pay | Admitting: Internal Medicine

## 2020-10-23 ENCOUNTER — Telehealth: Payer: Self-pay | Admitting: Internal Medicine

## 2020-10-23 VITALS — Ht 61.0 in | Wt 167.0 lb

## 2020-10-23 DIAGNOSIS — E785 Hyperlipidemia, unspecified: Secondary | ICD-10-CM | POA: Diagnosis not present

## 2020-10-23 DIAGNOSIS — E1121 Type 2 diabetes mellitus with diabetic nephropathy: Secondary | ICD-10-CM

## 2020-10-23 DIAGNOSIS — K2270 Barrett's esophagus without dysplasia: Secondary | ICD-10-CM

## 2020-10-23 DIAGNOSIS — Z794 Long term (current) use of insulin: Secondary | ICD-10-CM

## 2020-10-23 DIAGNOSIS — R6889 Other general symptoms and signs: Secondary | ICD-10-CM | POA: Insufficient documentation

## 2020-10-23 MED ORDER — INSULIN DETEMIR 100 UNIT/ML ~~LOC~~ SOLN
SUBCUTANEOUS | 0 refills | Status: DC
Start: 2020-10-23 — End: 2020-11-02

## 2020-10-23 NOTE — Assessment & Plan Note (Signed)
She has been forgetting to take her protonix regularly .  Advised her to take it in the morning upon waking

## 2020-10-23 NOTE — Progress Notes (Addendum)
Virtual Visit via [place]  This visit type was conducted due to national recommendations for restrictions regarding the COVID-19 pandemic (e.g. social distancing).  This format is felt to be most appropriate for this patient at this time.  All issues noted in this document were discussed and addressed.  No physical exam was performed (except for noted visual exam findings with Video Visits).   I connected with@ on 10/23/20 at  3:00 PM EST by a video enabled telemedicine application and verified that I am speaking with the correct person using two identifiers. Location patient: home Location provider: work or home office Persons participating in the virtual visit: patient, provider   I discussed the limitations, risks, security and privacy concerns of performing an evaluation and management service by telephone and the availability of in person appointments. I also discussed with the patient that there may be a patient responsible charge related to this service. The patient expressed understanding and agreed to proceed.  Interactive audio and video telecommunications were established between this provider and patient, however failed, due to patient having technical difficulties .    We continued and completed visit with audio only.   Reason for visit: follow up  HPI:  75 yr old female with type 2 DM,  uncontrolled by last A1C of 8.0 in December, returns for follow up. At most recent visit [place] often,  And was not following a strict diet.  Since last month she has veen very strict with her diet,  Taking her basal insulin dose morning and using 15 units .  Marland Kitchen  Her most recent log of BS is reviewed,.  She has been checking BS once daily in the afternoons and evenings,.   80% of them are < 160 . There have been no lows.   She admites that seh is unhappy with giving up so many of her favorite foods and wants to increase the insulin back to 20 units  since she has been more adherent to the mornign regimen.  [facility]   She has been forgetting to take her protonix pre meal as well   ROS: See pertinent positives and negatives per HPI.  Past Medical History:  Diagnosis Date   Allergy    Anxiety    Arthritis    Barrett's esophagus determined by endoscopy    Cancer ([place])    Uterine Cancer   Chronic kidney disease    Depression    Diabetes mellitus without complication ([place])    Difficult intubation    "not sure what happened,asleep was told had problem with intubation"   Diverticulosis    Fatty liver    GERD (gastroesophageal reflux disease)    History of bronchitis    History of hiatal hernia    Hyperlipidemia    Hypertension    Sleep apnea    Use C- PAP   Urinary incontinence    Varicose veins     Past Surgical History:  Procedure Laterality Date   CARDIAC CATHETERIZATION     CHOLECYSTECTOMY     COLONOSCOPY WITH PROPOFOL N/A 06/14/2016   Procedure: COLONOSCOPY WITH PROPOFOL;  Surgeon: Lollie Sails, MD;  Location: [facility] ENDOSCOPY;  Service: Endoscopy;  Laterality: N/A;   ESOPHAGOGASTRODUODENOSCOPY (EGD) WITH PROPOFOL N/A 06/14/2016   Procedure: ESOPHAGOGASTRODUODENOSCOPY (EGD) WITH PROPOFOL;  Surgeon: Lollie Sails, MD;  Location: [facility] ENDOSCOPY;  Service: Endoscopy;  Laterality: N/A;   ESOPHAGOGASTRODUODENOSCOPY (EGD)  WITH PROPOFOL N/A 06/12/2017   Procedure: ESOPHAGOGASTRODUODENOSCOPY (EGD) WITH PROPOFOL;  Surgeon: Lollie Sails, MD;  Location: [facility] ENDOSCOPY;  Service: Endoscopy;  Laterality: N/A;   ESOPHAGOGASTRODUODENOSCOPY (EGD) WITH PROPOFOL N/A 08/25/2017   Procedure: ESOPHAGOGASTRODUODENOSCOPY (EGD) WITH PROPOFOL;  Surgeon: Lollie Sails, MD;  Location: [facility] ENDOSCOPY;  Service: Endoscopy;  Laterality: N/A;   HYSTEROSCOPY WITH D & C N/A 01/02/2017   Procedure: DILATATION AND CURETTAGE /HYSTEROSCOPY;  Surgeon: Brayton Mars, MD;   Location: [facility] ORS;  Service: Gynecology;  Laterality: N/A;   JOINT REPLACEMENT Right 2007   Total Knee Replacement   JOINT REPLACEMENT Left 2004   Total Knee Replacement   LAPAROSCOPIC HYSTERECTOMY Bilateral 03/01/2017   Procedure: HYSTERECTOMY TOTAL LAPAROSCOPIC BSO;  Surgeon: Mellody Drown, MD;  Location: [facility] ORS;  Service: Gynecology;  Laterality: Bilateral;   ROTATOR CUFF REPAIR Right    SENTINEL NODE BIOPSY N/A 03/01/2017   Procedure: SENTINEL NODE INJECTION AND BIOPSY;  Surgeon: Mellody Drown, MD;  Location: [facility] ORS;  Service: Gynecology;  Laterality: N/A;   TONSILLECTOMY     as a child   TUBAL LIGATION     UPPER GI ENDOSCOPY     x2    Family History  Problem Relation Age of Onset   Stroke Mother    Hypertension Mother    Lupus Mother    Alcohol abuse Father    Diabetes Father    Cancer Father        gallbladder ca into liver    Arthritis Sister    Diabetes Sister    Lumbar disc disease Sister    Hyperlipidemia Daughter    Hypertension Daughter    Cancer Maternal Aunt        ovarian ca   Diabetes Paternal Aunt    Drug abuse Paternal Aunt    Cancer Maternal Grandfather        colon ca   Hypertension Brother    Alcohol abuse Brother    Stroke Brother     SOCIAL HX:  reports that she has never smoked. She has never used smokeless tobacco. She reports previous alcohol use. She reports that she does not use drugs.   Current Outpatient Medications:    ACCU-CHEK AVIVA PLUS test strip, TEST TWO TIMES DAILY, Disp: 200 each, Rfl: 0   acetaminophen (TYLENOL) 500 MG tablet, Take 2 tablets (1,000 mg total) by mouth every 6 (six) hours. (Patient taking differently: Take 1,000 mg by mouth every 6 (six) hours as needed.), Disp: 65 tablet, Rfl: 1   albuterol (PROVENTIL HFA;VENTOLIN HFA) 108 (90 BASE) MCG/ACT inhaler, Inhale 2 puffs into the lungs every 6 (six) hours as needed for wheezing or shortness of breath. , Disp: , Rfl:    Ascorbic Acid  500 MG CHEW, Chew by mouth., Disp: , Rfl:    aspirin EC 81 MG tablet, Take 81 mg by mouth every evening. , Disp: , Rfl:    atorvastatin (LIPITOR) 40 MG tablet, Take 0.5 tablets (20 mg total) by mouth daily at 6 PM., Disp: 90 tablet, Rfl: 3   azelastine (ASTELIN) 0.1 % nasal spray, Place 1 spray into both nostrils daily., Disp: , Rfl:    benzonatate (TESSALON) 100 MG capsule, Take 1 capsule (100 mg total) by mouth 3 (three) times daily as needed for cough., Disp: 21 capsule, Rfl: 0   Biotin 5000 MCG TABS, Take by mouth 2 (two) times daily. , Disp: , Rfl:    blood glucose meter kit and supplies, Use to check  blood sugar twice daily. ICD 10: E11.29, Disp: 1 each, Rfl: 0   Cetirizine HCl 10 MG CAPS, Take by mouth., Disp: , Rfl:    Cinnamon Bark POWD, Take by mouth., Disp: , Rfl:    COLLAGEN PO, Take by mouth 2 (two) times daily. For improved fingernail strength, Disp: , Rfl:    docusate sodium (COLACE) 100 MG capsule, Take 1 capsule (100 mg total) by mouth 2 (two) times daily., Disp: 20 capsule, Rfl: 0   fish oil-omega-3 fatty acids 1000 MG capsule, Take 1 g by mouth every evening. , Disp: , Rfl:    fluticasone (FLONASE) 50 MCG/ACT nasal spray, Place 1 spray into both nostrils daily., Disp: 48 g, Rfl: 1   furosemide (LASIX) 20 MG tablet, Take 1 tablet (20 mg total) by mouth every other day., Disp: 45 tablet, Rfl: 3   JANUVIA 100 MG tablet, TAKE 1 TABLET BY MOUTH  DAILY, Disp: 90 tablet, Rfl: 3   Lancet Devices (ACCU-CHEK SOFTCLIX) lancets, Use to test blood sugars 1 -2 times daily, Disp: 1 each, Rfl: 3   loratadine (CLARITIN) 10 MG tablet, Take 10 mg by mouth daily., Disp: , Rfl:    losartan-hydrochlorothiazide (HYZAAR) 50-12.5 MG tablet, TAKE 1 TABLET BY MOUTH  DAILY, Disp: 90 tablet, Rfl: 3   Magnesium 250 MG TABS, Take 1 tablet by mouth daily., Disp: , Rfl:    meclizine (ANTIVERT) 25 MG tablet, Take 1 tablet (25 mg total) by mouth 3 (three) times daily as needed for dizziness.,  Disp: 30 tablet, Rfl: 3   metFORMIN (GLUCOPHAGE) 500 MG tablet, Take 1 tablet (500 mg total) by mouth 2 (two) times daily with a meal., Disp: 180 tablet, Rfl: 2   Misc Natural Products (NEURIVA PO), Take by mouth in the morning and at bedtime., Disp: , Rfl:    Multiple Vitamin (MULTIVITAMIN) tablet, Take 1 tablet by mouth daily., Disp: , Rfl:    nitroGLYCERIN (NITROSTAT) 0.4 MG SL tablet, Place 1 tablet (0.4 mg total) under the tongue every 5 (five) minutes as needed for chest pain., Disp: 50 tablet, Rfl: 3   NON FORMULARY, medroxy progesterone one 47m pill once daily, Disp: , Rfl:    oxybutynin (DITROPAN-XL) 10 MG 24 hr tablet, Take 1 tablet (10 mg total) by mouth daily., Disp: 90 tablet, Rfl: 3   pantoprazole (PROTONIX) 40 MG tablet, Take 1 tablet by mouth twice daily, Disp: 180 tablet, Rfl: 3   potassium chloride SA (KLOR-CON) 20 MEQ tablet, TAKE 1 TABLET BY MOUTH EVERY OTHER DAY, Disp: 45 tablet, Rfl: 0   Probiotic Product (PROBIOTIC-10) CAPS, Take 1 capsule by mouth daily. , Disp: , Rfl:    sertraline (ZOLOFT) 50 MG tablet, Take 1 tablet (50 mg total) by mouth daily., Disp: 90 tablet, Rfl: 3   tiZANidine (ZANAFLEX) 4 MG tablet, tizanidine 4 mg tablet, Disp: , Rfl:    verapamil (VERELAN PM) 180 MG 24 hr capsule, Take 1 capsule (180 mg total) by mouth daily., Disp: 90 capsule, Rfl: 3   vitamin B-12 (CYANOCOBALAMIN) 1000 MCG tablet, Take 1,000 mcg by mouth daily., Disp: , Rfl:    Zinc 50 MG TABS, Take by mouth daily., Disp: , Rfl:    insulin detemir (LEVEMIR) 100 UNIT/ML injection, INJECT 20 UNITS SUBCUTANEOUSLY IN THE MORNING, Disp: 10 mL, Rfl: 0  EXAM:  VITALS per patient if applicable:  GENERAL: alert, oriented, appears well and in no acute distress  HEENT: atraumatic, conjunttiva clear, no obvious abnormalities on inspection of external nose  and ears  NECK: normal movements of the head and neck  LUNGS: on inspection no signs of respiratory distress, breathing rate  appears normal, no obvious gross SOB, gasping or wheezing  CV: no obvious cyanosis  MS: moves all visible extremities without noticeable abnormality  PSYCH/NEURO: pleasant and cooperative, no obvious depression or anxiety, speech and thought processing grossly intact  MMSE:  28/30   (calculated #nickels in $1.25 on the 2nd try,  Recall 2/3 at one minute)   ASSESSMENT AND PLAN:  Discussed the following assessment and plan:  Type 2 diabetes mellitus with diabetic nephropathy, with long-term current use of insulin (HCC) - Plan: Hemoglobin A1c, Comprehensive metabolic panel  Hyperlipidemia LDL goal <100 - Plan: Lipid panel  Forgetfulness - Plan: Vitamin B12, TSH  Barrett's esophagus without dysplasia  DM (diabetes mellitus), type 2 with renal complications (HCC) Resume 20 units of levemir daily in the morning to allow her to have an occasional indulgence.  Encouraged to limit indulgences to the days that she exercises.   Barrett's esophagus She has been forgetting to take her protonix regularly .  Advised her to take it in the morning upon waking   Forgetfulness Seems to be limited to medication adherence.  MMSE was done today and she scored 28/30.  Will check b12 and thyroid next time     I discussed the assessment and treatment plan with the patient. The patient was provided an opportunity to ask questions and all were answered. The patient agreed with the plan and demonstrated an understanding of the instructions.   The patient was advised to call back or seek an in-person evaluation if the symptoms worsen or if the condition fails to improve as anticipated.   Video connection was lost at > 50% of the duration of the visit ,  At which time the remainder of the visit was completed via audio only.  I spent 30 minutes dedicated to the care of this patient on the date of this encounter to include pre-visit review of his medical history,  Face-to-face time with the patient , and post  visit ordering of testing and therapeutics.   Sandra Mc, MD

## 2020-10-23 NOTE — Telephone Encounter (Signed)
Lm on vm to call office and schedule fasting labs around March 16th and a OV in April.

## 2020-10-23 NOTE — Assessment & Plan Note (Signed)
Seems to be limited to medication adherence.  MMSE was done today and she scored 28/30.  Will check b12 and thyroid next time

## 2020-10-23 NOTE — Assessment & Plan Note (Signed)
Resume 20 units of levemir daily in the morning to allow her to have an occasional indulgence.  Encouraged to limit indulgences to the days that she exercises.

## 2020-10-23 NOTE — Patient Instructions (Signed)
Ok to increase Levemir dose to 20 units IN THE MORNING  Remember to EXERCISE on the day you INDULGE! (EVEN IF IT'S JUST A WALK)  REPEAT LABS ON OR AFTER MARCH 16  RETURN TO SEE ME IN April

## 2020-11-02 ENCOUNTER — Other Ambulatory Visit: Payer: Self-pay | Admitting: Internal Medicine

## 2020-11-03 ENCOUNTER — Other Ambulatory Visit: Payer: Self-pay | Admitting: Internal Medicine

## 2020-11-06 ENCOUNTER — Other Ambulatory Visit: Payer: Self-pay | Admitting: Internal Medicine

## 2020-11-09 ENCOUNTER — Other Ambulatory Visit: Payer: Self-pay

## 2020-11-09 ENCOUNTER — Telehealth: Payer: Self-pay

## 2020-11-09 MED ORDER — INSULIN DETEMIR 100 UNIT/ML ~~LOC~~ SOLN: SUBCUTANEOUS | 2 refills | Status: DC

## 2020-11-09 NOTE — Telephone Encounter (Signed)
[facility] pharmacy called and states that insulin detemir (LEVEMIR) 100 UNIT/ML injection was supposed to be sent to them and not [facility]. Please advise

## 2020-11-12 ENCOUNTER — Telehealth: Payer: Self-pay

## 2020-11-12 ENCOUNTER — Other Ambulatory Visit: Payer: Self-pay | Admitting: Internal Medicine

## 2020-11-12 NOTE — Telephone Encounter (Signed)
Pt states that she was going to order her [facility] guide but it rx has expired. Please call pt. She uses [facility] on [address]

## 2020-11-18 ENCOUNTER — Other Ambulatory Visit: Admission: RE | Admit: 2020-11-18 | Payer: Medicare PPO | Source: Ambulatory Visit

## 2020-11-19 NOTE — Telephone Encounter (Signed)
I called patient & she stated that she had already received her test strips.

## 2020-11-23 ENCOUNTER — Ambulatory Visit (INDEPENDENT_AMBULATORY_CARE_PROVIDER_SITE_OTHER): Payer: Medicare PPO

## 2020-11-23 VITALS — Ht 61.0 in | Wt 167.0 lb

## 2020-11-23 DIAGNOSIS — M67961 Unspecified disorder of synovium and tendon, right lower leg: Secondary | ICD-10-CM | POA: Diagnosis not present

## 2020-11-23 DIAGNOSIS — Z0000 Encounter for general adult medical examination without abnormal findings: Secondary | ICD-10-CM

## 2020-11-23 NOTE — Patient Instructions (Addendum)
Ms. Blake , Thank you for taking time to come for your Medicare Wellness Visit. I appreciate your ongoing commitment to your health goals. Please review the following plan we discussed and let me know if I can assist you in the future.   These are the goals we discussed: Goals    . Maintain healthy lifestyle       This is a list of the screening recommended for you and due dates:  Health Maintenance  Topic Date Due  . COVID-19 Vaccine (4 - Booster for [place] series) 11/15/2020  . Hemoglobin A1C  03/10/2021  . Complete foot exam   05/19/2021  . Colon Cancer Screening  06/14/2021  . Mammogram  07/09/2021  . Eye exam for diabetics  08/04/2021  . Tetanus Vaccine  01/18/2024  . Flu Shot  Completed  . DEXA scan (bone density measurement)  Completed  .  Hepatitis C: One time screening is recommended by Center for Disease Control  (CDC) for  adults born from [birth year] through 1967.   Completed  . Pneumonia vaccines  Completed    Immunizations     Immunization History  Administered Date(s) Administered  . Fluad Quad(high Dose 65+) 06/08/2019  . Hep A / Hep B 08/20/2019, 08/20/2019, 09/17/2019  . Influenza Split 05/21/2013, 06/06/2014, 05/28/2017  . Influenza, High Dose Seasonal PF 05/29/2018  . Influenza,inj,Quad PF,6+ Mos 06/05/2015, 06/09/2017  . Influenza-Unspecified 06/19/2012, 06/14/2016, 04/26/2018, 07/10/2020  . Moderna Sars-Covid-2 Vaccination 10/28/2019, 11/25/2019, 05/15/2020  . PFIZER(Purple Top)SARS-COV-2 Vaccination 10/28/2019, 11/25/2019, 05/15/2020  . Pneumococcal Conjugate-13 10/21/2013  . Pneumococcal Polysaccharide-23 07/15/2004, 05/08/2010  . Td 11/22/2003  . Tdap 01/17/2014  . Zoster    Keep all routine maintenance appointments.   Next scheduled lab 12/10/20 @ 8:15  Follow up 01/14/21  Advanced directives: End of life planning; Advance aging; Advanced directives discussed.  Copy of current HCPOA/Living Will requested.    Conditions/risks identified: none  new.   Follow up in one year for your annual wellness visit.   Preventive Care 56 Years and Older, Female Preventive care refers to lifestyle choices and visits with your health care provider that can promote health and wellness. What does preventive care include?  A yearly physical exam. This is also called an annual well check.  Dental exams once or twice a year.  Routine eye exams. Ask your health care provider how often you should have your eyes checked.  Personal lifestyle choices, including:  Daily care of your teeth and gums.  Regular physical activity.  Eating a healthy diet.  Avoiding tobacco and drug use.  Limiting alcohol use.  Practicing safe sex.  Taking low-dose aspirin every day.  Taking vitamin and mineral supplements as recommended by your health care provider. What happens during an annual well check? The services and screenings done by your health care provider during your annual well check will depend on your age, overall health, lifestyle risk factors, and family history of disease. Counseling  Your health care provider may ask you questions about your:  Alcohol use.  Tobacco use.  Drug use.  Emotional well-being.  Home and relationship well-being.  Sexual activity.  Eating habits.  History of falls.  Memory and ability to understand (cognition).  Work and work Statistician.  Reproductive health. Screening  You may have the following tests or measurements:  Height, weight, and BMI.  Blood pressure.  Lipid and cholesterol levels. These may be checked every 5 years, or more frequently if you are over 30 years old.  Skin check.  Lung cancer screening. You may have this screening every year starting at age 17 if you have a 30-pack-year history of smoking and currently smoke or have quit within the past 15 years.  Fecal occult blood test (FOBT) of the stool. You may have this test every year starting at age 50.  Flexible  sigmoidoscopy or colonoscopy. You may have a sigmoidoscopy every 5 years or a colonoscopy every 10 years starting at age 31.  Hepatitis C blood test.  Hepatitis B blood test.  Sexually transmitted disease (STD) testing.  Diabetes screening. This is done by checking your blood sugar (glucose) after you have not eaten for a while (fasting). You may have this done every 1-3 years.  Bone density scan. This is done to screen for osteoporosis. You may have this done starting at age 62.  Mammogram. This may be done every 1-2 years. Talk to your health care provider about how often you should have regular mammograms. Talk with your health care provider about your test results, treatment options, and if necessary, the need for more tests. Vaccines  Your health care provider may recommend certain vaccines, such as:  Influenza vaccine. This is recommended every year.  Tetanus, diphtheria, and acellular pertussis (Tdap, Td) vaccine. You may need a Td booster every 10 years.  Zoster vaccine. You may need this after age 59.  Pneumococcal 13-valent conjugate (PCV13) vaccine. One dose is recommended after age 50.  Pneumococcal polysaccharide (PPSV23) vaccine. One dose is recommended after age 18. Talk to your health care provider about which screenings and vaccines you need and how often you need them. This information is not intended to replace advice given to you by your health care provider. Make sure you discuss any questions you have with your health care provider. Document Released: 10/09/2015 Document Revised: 06/01/2016 Document Reviewed: 07/14/2015 Elsevier Interactive Patient Education  2017 [employer] Prevention in the Home Falls can cause injuries. They can happen to people of all ages. There are many things you can do to make your home safe and to help prevent falls. What can I do on the outside of my home?  Regularly fix the edges of walkways and driveways and fix any  cracks.  Remove anything that might make you trip as you walk through a door, such as a raised step or threshold.  Trim any bushes or trees on the path to your home.  Use bright outdoor lighting.  Clear any walking paths of anything that might make someone trip, such as rocks or tools.  Regularly check to see if handrails are loose or broken. Make sure that both sides of any steps have handrails.  Any raised decks and porches should have guardrails on the edges.  Have any leaves, snow, or ice cleared regularly.  Use sand or salt on walking paths during winter.  Clean up any spills in your garage right away. This includes oil or grease spills. What can I do in the bathroom?  Use night lights.  Install grab bars by the toilet and in the tub and shower. Do not use towel bars as grab bars.  Use non-skid mats or decals in the tub or shower.  If you need to sit down in the shower, use a plastic, non-slip stool.  Keep the floor dry. Clean up any water that spills on the floor as soon as it happens.  Remove soap buildup in the tub or shower regularly.  Attach bath mats securely  with double-sided non-slip rug tape.  Do not have throw rugs and other things on the floor that can make you trip. What can I do in the bedroom?  Use night lights.  Make sure that you have a light by your bed that is easy to reach.  Do not use any sheets or blankets that are too big for your bed. They should not hang down onto the floor.  Have a firm chair that has side arms. You can use this for support while you get dressed.  Do not have throw rugs and other things on the floor that can make you trip. What can I do in the kitchen?  Clean up any spills right away.  Avoid walking on wet floors.  Keep items that you use a lot in easy-to-reach places.  If you need to reach something above you, use a strong step stool that has a grab bar.  Keep electrical cords out of the way.  Do not use floor  polish or wax that makes floors slippery. If you must use wax, use non-skid floor wax.  Do not have throw rugs and other things on the floor that can make you trip. What can I do with my stairs?  Do not leave any items on the stairs.  Make sure that there are handrails on both sides of the stairs and use them. Fix handrails that are broken or loose. Make sure that handrails are as long as the stairways.  Check any carpeting to make sure that it is firmly attached to the stairs. Fix any carpet that is loose or worn.  Avoid having throw rugs at the top or bottom of the stairs. If you do have throw rugs, attach them to the floor with carpet tape.  Make sure that you have a light switch at the top of the stairs and the bottom of the stairs. If you do not have them, ask someone to add them for you. What else can I do to help prevent falls?  Wear shoes that:  Do not have high heels.  Have rubber bottoms.  Are comfortable and fit you well.  Are closed at the toe. Do not wear sandals.  If you use a stepladder:  Make sure that it is fully opened. Do not climb a closed stepladder.  Make sure that both sides of the stepladder are locked into place.  Ask someone to hold it for you, if possible.  Clearly mark and make sure that you can see:  Any grab bars or handrails.  First and last steps.  Where the edge of each step is.  Use tools that help you move around (mobility aids) if they are needed. These include:  Canes.  Walkers.  Scooters.  Crutches.  Turn on the lights when you go into a dark area. Replace any light bulbs as soon as they burn out.  Set up your furniture so you have a clear path. Avoid moving your furniture around.  If any of your floors are uneven, fix them.  If there are any pets around you, be aware of where they are.  Review your medicines with your doctor. Some medicines can make you feel dizzy. This can increase your chance of falling. Ask your  doctor what other things that you can do to help prevent falls. This information is not intended to replace advice given to you by your health care provider. Make sure you discuss any questions you have with your health care  provider. Document Released: 07/09/2009 Document Revised: 02/18/2016 Document Reviewed: 10/17/2014 Elsevier Interactive Patient Education  2017 [employer].

## 2020-11-23 NOTE — Progress Notes (Addendum)
Subjective:   Sandra Hendrix is a 75 y.o. female who presents for Medicare Annual (Subsequent) preventive examination.  Review of Systems    No ROS.  Medicare Wellness Virtual Visit.  Cardiac Risk Factors include: advanced age (>55mn, >>64women);hypertension;diabetes mellitus     Objective:    Today's Vitals   11/23/20 1231  Weight: 167 lb (75.8 kg)  Height: _0  (1.549 m)   Body mass index is 31.55 kg/m.  Advanced Directives 11/23/2020 09/16/2020 01/28/2020 11/21/2019 09/10/2019 05/23/2019 10/11/2018  Does Patient Have a Medical Advance Directive? _1  No Yes  Type of AParamedicof [place] will [place] will - Living will [place] will - Living will  Does patient want to make changes to medical advance directive? No - Patient declined Yes (ED - Information included in AVS) No - Patient declined No - Patient declined No - Patient declined - Yes (MAU/Ambulatory/Procedural Areas - Information given)  Copy of [place] Chart? No - copy requested - - - No - copy requested - -  Would patient like information on creating a medical advance directive? - - - - - No - Patient declined -    Current Medications (verified) Outpatient Encounter Medications as of 11/23/2020  Medication Sig   ACCU-CHEK GUIDE test strip USE 1 STRIP TWICE DAILY   acetaminophen (TYLENOL) 500 MG tablet Take 2 tablets (1,000 mg total) by mouth every 6 (six) hours. (Patient taking differently: Take 1,000 mg by mouth every 6 (six) hours as needed.)   albuterol (PROVENTIL HFA;VENTOLIN HFA) 108 (90 BASE) MCG/ACT inhaler Inhale 2 puffs into the lungs every 6 (six) hours as needed for wheezing or shortness of breath.    Ascorbic Acid 500 MG CHEW Chew by mouth.   aspirin EC 81 MG tablet Take 81 mg by mouth every evening.    atorvastatin (LIPITOR) 40 MG tablet Take 0.5 tablets (20 mg total) by mouth daily  at 6 PM.   azelastine (ASTELIN) 0.1 % nasal spray Place 1 spray into both nostrils daily.   benzonatate (TESSALON) 100 MG capsule Take 1 capsule (100 mg total) by mouth 3 (three) times daily as needed for cough.   Biotin 5000 MCG TABS Take by mouth 2 (two) times daily.    blood glucose meter kit and supplies Use to check blood sugar twice daily. ICD 10: E11.29   Cetirizine HCl 10 MG CAPS Take by mouth.   Cinnamon Bark POWD Take by mouth.   COLLAGEN PO Take by mouth 2 (two) times daily. For improved fingernail strength   docusate sodium (COLACE) 100 MG capsule Take 1 capsule (100 mg total) by mouth 2 (two) times daily.   fish oil-omega-3 fatty acids 1000 MG capsule Take 1 g by mouth every evening.    fluticasone (FLONASE) 50 MCG/ACT nasal spray Place 1 spray into both nostrils daily.   furosemide (LASIX) 20 MG tablet Take 1 tablet (20 mg total) by mouth every other day.   insulin detemir (LEVEMIR) 100 UNIT/ML injection INJECT 20 UNITS SUBCUTANEOUSLY AT BEDTIME   JANUVIA 100 MG tablet TAKE 1 TABLET BY MOUTH  DAILY   Lancet Devices (ACCU-CHEK SOFTCLIX) lancets Use to test blood sugars 1 -2 times daily   loratadine (CLARITIN) 10 MG tablet Take 10 mg by mouth daily.   losartan-hydrochlorothiazide (HYZAAR) 50-12.5 MG tablet TAKE 1 TABLET BY MOUTH  DAILY   Magnesium 250 MG TABS Take 1 tablet  by mouth daily.   meclizine (ANTIVERT) 25 MG tablet Take 1 tablet (25 mg total) by mouth 3 (three) times daily as needed for dizziness.   metFORMIN (GLUCOPHAGE) 500 MG tablet Take 1 tablet (500 mg total) by mouth 2 (two) times daily with a meal.   Misc Natural Products (NEURIVA PO) Take by mouth in the morning and at bedtime.   Multiple Vitamin (MULTIVITAMIN) tablet Take 1 tablet by mouth daily.   nitroGLYCERIN (NITROSTAT) 0.4 MG SL tablet Place 1 tablet (0.4 mg total) under the tongue every 5 (five) minutes as needed for chest pain.   NON FORMULARY medroxy progesterone one 69m pill once daily   oxybutynin  (DITROPAN-XL) 10 MG 24 hr tablet Take 1 tablet (10 mg total) by mouth daily.   pantoprazole (PROTONIX) 40 MG tablet Take 1 tablet by mouth twice daily   potassium chloride SA (KLOR-CON) 20 MEQ tablet TAKE 1 TABLET BY MOUTH EVERY OTHER DAY   Probiotic Product (PROBIOTIC-10) CAPS Take 1 capsule by mouth daily.    sertraline (ZOLOFT) 50 MG tablet Take 1 tablet (50 mg total) by mouth daily.   tiZANidine (ZANAFLEX) 4 MG tablet tizanidine 4 mg tablet   verapamil (VERELAN PM) 180 MG 24 hr capsule Take 1 capsule (180 mg total) by mouth daily.   vitamin B-12 (CYANOCOBALAMIN) 1000 MCG tablet Take 1,000 mcg by mouth daily.   Zinc 50 MG TABS Take by mouth daily.   No facility-administered encounter medications on file as of 11/23/2020.    Allergies (verified) Clindamycin/lincomycin, Codeine, Lincomycin, Morphine and related, Talwin [pentazocine], Lisinopril, Naloxone, and Oxycodone-acetaminophen   History: Past Medical History:  Diagnosis Date   Allergy    Anxiety    Arthritis    Barrett's esophagus determined by endoscopy    Cancer ([place]    Uterine Cancer   Chronic kidney disease    Depression    Diabetes mellitus without complication ([place]    Difficult intubation    "not sure what happened,asleep was told had problem with intubation"   Diverticulosis    Fatty liver    GERD (gastroesophageal reflux disease)    History of bronchitis    History of hiatal hernia    Hyperlipidemia    Hypertension    Sleep apnea    Use C- PAP   Urinary incontinence    Varicose veins    Past Surgical History:  Procedure Laterality Date   CARDIAC CATHETERIZATION     CHOLECYSTECTOMY     COLONOSCOPY WITH PROPOFOL N/A 06/14/2016   Procedure: COLONOSCOPY WITH PROPOFOL;  Surgeon: MLollie Sails MD;  [facility];  Service: Endoscopy;  Laterality: N/A;   ESOPHAGOGASTRODUODENOSCOPY (EGD) WITH PROPOFOL N/A 06/14/2016   Procedure: ESOPHAGOGASTRODUODENOSCOPY (EGD) WITH PROPOFOL;  Surgeon: MLollie Sails MD;  [facility];  Service: Endoscopy;  Laterality: N/A;   ESOPHAGOGASTRODUODENOSCOPY (EGD) WITH PROPOFOL N/A 06/12/2017   Procedure: ESOPHAGOGASTRODUODENOSCOPY (EGD) WITH PROPOFOL;  Surgeon: SLollie Sails MD;  [facility]  Service: Endoscopy;  Laterality: N/A;   ESOPHAGOGASTRODUODENOSCOPY (EGD) WITH PROPOFOL N/A 08/25/2017   Procedure: ESOPHAGOGASTRODUODENOSCOPY (EGD) WITH PROPOFOL;  Surgeon: SLollie Sails MD;  [facility];  Service: Endoscopy;  Laterality: N/A;   HYSTEROSCOPY WITH D & C N/A 01/02/2017   Procedure: DILATATION AND CURETTAGE /HYSTEROSCOPY;  Surgeon: MBrayton Mars MD;  Location: [facility];  Service: Gynecology;  Laterality: N/A;   JOINT REPLACEMENT Right 2007   Total Knee Replacement   JOINT REPLACEMENT Left 2004   Total Knee  Replacement   LAPAROSCOPIC HYSTERECTOMY Bilateral 03/01/2017   Procedure: HYSTERECTOMY TOTAL LAPAROSCOPIC BSO;  Surgeon: Mellody Drown, MD;  Location: [facility];  Service: Gynecology;  Laterality: Bilateral;   ROTATOR CUFF REPAIR Right    SENTINEL NODE BIOPSY N/A 03/01/2017   Procedure: SENTINEL NODE INJECTION AND BIOPSY;  Surgeon: Mellody Drown, MD;  Location: [facility];  Service: Gynecology;  Laterality: N/A;   TONSILLECTOMY     as a child   TUBAL LIGATION     UPPER GI ENDOSCOPY     x2   Family History  Problem Relation Age of Onset   Stroke Mother    Hypertension Mother    Lupus Mother    Alcohol abuse Father    Diabetes Father    Cancer Father        gallbladder ca into liver    Arthritis Sister    Diabetes Sister    Lumbar disc disease Sister    Hyperlipidemia Daughter    Hypertension Daughter    Cancer Maternal Aunt        ovarian ca   Diabetes Paternal Aunt    Drug abuse Paternal Aunt    Cancer Maternal Grandfather        colon ca   Hypertension Brother    Alcohol abuse Brother    Stroke Brother    Social History   Socioeconomic History   Marital status:  Married    Spouse name: Not on file   Number of children: 2   Years of education: 14   Highest education level: Some college, no degree  Occupational History   Not on file  Tobacco Use   Smoking status: Never Smoker   Smokeless tobacco: Never Used  Scientific laboratory technician Use: Never used  Substance and Sexual Activity   Alcohol use: Not Currently    Comment: rare   Drug use: No   Sexual activity: Yes    Comment: not often  Other Topics Concern   Not on file  Social History Narrative   Not on file   Social Determinants of Health   Financial Resource Strain: Not on file  Food Insecurity: No Food Insecurity   Worried About Running Out of Food in the Last Year: Never true   Ran Out of Food in the Last Year: Never true  Transportation Needs: No Transportation Needs   Lack of Transportation (Medical): No   Lack of Transportation (Non-Medical): No  Physical Activity: Sufficiently Active   Days of Exercise per Week: 3 days   Minutes of Exercise per Session: 60 min  Stress: No Stress Concern Present   Feeling of Stress : Not at all  Social Connections: Socially Integrated   Frequency of Communication with Friends and Family: More than three times a week   Frequency of Social Gatherings with Friends and Family: More than three times a week   Attends Religious Services: More than 4 times per year   Active Member of [employer] or Organizations: Yes   Attends Music therapist: More than 4 times per year   Marital Status: Married    Tobacco Counseling Counseling given: Not Answered   Clinical Intake:  Pre-visit preparation completed: Yes        Diabetes: Yes (Followed by PCP)  How often do you need to have someone help you when you read instructions, pamphlets, or other written materials from your doctor or pharmacy?: 1 - Never  Nutrition Risk Assessment: Has the patient had any N/V/D within  the last 2 weeks?  Yes  Does the patient have any non-healing wounds?   Yes  Has the patient had any unintentional weight loss or weight gain?  Yes   Diabetes: If diabetic, was a CBG obtained today?  No  Did the patient bring in their glucometer from home?  No  How often do you monitor your CBG's? Every other day.   Financial Strains and Diabetes Management: Are you having any financial strains with the device, your supplies or your medication? No .  Does the patient want to be seen by Chronic Care Management for management of their diabetes?  No  Would the patient like to be referred to a Nutritionist or for Diabetic Management?  No   Diabetic Exams: Diabetic Eye Exam: Completed 08/04/20    Diabetic Foot Exam: Completed 04/29/20  Interpreter Needed?: No      Activities of Daily Living In your present state of health, do you have any difficulty performing the following activities: 11/23/2020  Hearing? N  Vision? N  Difficulty concentrating or making decisions? N  Walking or climbing stairs? N  Dressing or bathing? N  Doing errands, shopping? N  Preparing Food and eating ? N  Using the Toilet? N  In the past six months, have you accidently leaked urine? Y  Comment Followed by every 6 months, Dr. Holley Raring  Do you have problems with loss of bowel control? N  Managing your Medications? N  Managing your Finances? N  Housekeeping or managing your Housekeeping? N  Some recent data might be hidden    Patient Care Team: Crecencio Mc, MD as PCP - General (Internal Medicine) Clent Jacks, RN as Registered Nurse  Indicate any recent Medical Services you may have received from other than [facility] providers in the past year (date may be approximate).     Assessment:   This is a routine wellness examination for Sandra Hendrix.  I connected with Sandra Hendrix today by telephone and verified that I am speaking with the correct person using two identifiers. Location patient: home Location provider: work Persons participating in the virtual visit: patient, Marine scientist.     I discussed the limitations, risks, security and privacy concerns of performing an evaluation and management service by telephone and the availability of in person appointments. The patient expressed understanding and verbally consented to this telephonic visit.    Interactive audio and video telecommunications were attempted between this provider and patient, however failed, due to patient having technical difficulties OR patient did not have access to video capability.  We continued and completed visit with audio only.  Some vital signs may be absent or patient reported.   Hearing/Vision screen  Hearing Screening   _0  _1  _2  _3  _4  _5  _6  _7  _8   Right ear:           Left ear:           Comments: Followed by [facility]  Visits every 6 months  Hearing aid, bilateral  Vision Screening Comments: Cataract extraction, bilateral  Visual acuity not assessed, virtual visit. They have seen their ophthalmologist in the last 12 months.   Dietary issues and exercise activities discussed: Current Exercise Habits: Home exercise routine, Type of exercise: calisthenics;stretching, Time (Minutes): 60, Frequency (Times/Week): 3, Weekly Exercise (Minutes/Week): 180, Intensity: Moderate  Healthy diet Good water intake   Goals      Maintain healthy lifestyle       Depression Screen [facility] 2/9 Scores 11/23/2020 11/21/2019 10/16/2019 09/27/2018 12/12/2017 06/28/2017  11/28/2016  PHQ - 2 Score 0 0 0 0 0 0 0  PHQ- 9 Score - 0 0 - - - -    Fall Risk Fall Risk  11/23/2020 09/07/2020 05/19/2020 01/22/2020 11/21/2019  Falls in the past year? 0 1 1 0 0  Number falls in past yr: 0 1 1 - -  Injury with Fall? 0 0 0 - -  Comment - - - - -  Risk for fall due to : - - - - -  Follow up _0   Comment - - - - -    FALL RISK PREVENTION PERTAINING TO THE  HOME: Handrails in use when climbing stairs? Yes  Home free of loose throw rugs in walkways, pet beds, electrical cords, etc? Yes  Adequate lighting in your home to reduce risk of falls? Yes    ASSISTIVE DEVICES UTILIZED TO PREVENT FALLS: Life alert? No  Use of a cane, walker or w/c? No  Grab bars in the bathroom? No  Shower chair or bench in shower? No  Elevated toilet seat or a handicapped toilet? Yes   TIMED UP AND GO: Was the test performed? No . Virtual visit.   Cognitive Function: Patient is alert and oriented x3 Denies difficulty focusing, making decisions,, memory loss.  Enjoys reading for brain health.  No difficulty managing medications/fiances.  MMSE/6CIT deferred. Normal by direct communication/observation.  MMSE - Mini Mental State Exam 12/12/2017 11/28/2016 10/26/2015  Orientation to time _1 Orientation to Place _2 Registration _3 Attention/ Calculation _4 Recall _5 Language- name 2 objects _6 Language- repeat _7 Language- follow 3 step command _8 Language- read & follow direction _9 Write a sentence _10 Copy design _11 Total score _12 6CIT Screen 11/21/2019  What Year? 0 points  What month? 0 points  What time? 0 points  Count back from 20 0 points  Months in reverse 0 points  Repeat phrase 0 points  Total Score 0    Immunizations Immunization History  Administered Date(s) Administered   Fluad Quad(high Dose 65+) 06/08/2019   Hep A / Hep B 08/20/2019, 08/20/2019, 09/17/2019   Influenza Split 05/21/2013, 06/06/2014, 05/28/2017   Influenza, High Dose Seasonal PF 05/29/2018   Influenza,inj,Quad PF,6+ Mos 06/05/2015, 06/09/2017   Influenza-Unspecified 06/19/2012, 06/14/2016, 04/26/2018, 07/10/2020   Moderna Sars-Covid-2 Vaccination 10/28/2019, 11/25/2019, 05/15/2020   PFIZER(Purple Top)SARS-COV-2 Vaccination 10/28/2019, 11/25/2019, 05/15/2020   Pneumococcal Conjugate-13 10/21/2013   Pneumococcal  Polysaccharide-23 07/15/2004, 05/08/2010   Td 11/22/2003   Tdap 01/17/2014   Zoster 01/17/2014   Health Maintenance Health Maintenance  Topic Date Due   COVID-19 Vaccine (4 - Booster for Ward series) 11/15/2020   HEMOGLOBIN A1C  03/10/2021   FOOT EXAM  05/19/2021   COLONOSCOPY (Pts 45-71yr Insurance coverage will need to be confirmed)  06/14/2021   MAMMOGRAM  07/09/2021   OPHTHALMOLOGY EXAM  08/04/2021   TETANUS/TDAP  01/18/2024   INFLUENZA VACCINE  Completed   DEXA SCAN  Completed   Hepatitis C Screening  Completed   PNA vac Low Risk Adult  Completed   Colorectal cancer screening: Type of screening: Colonoscopy. Completed 06/14/16. Repeat every 5 years. Scheduled 12/19/19.   Mammogram status: Completed 07/09/20. Repeat every year  Bone Density status: Completed 10/23/18. Results reflect: Bone density results: NORMAL. Repeat every 5 years.  Lung Cancer Screening: (Low Dose CT Chest recommended if Age 81-80 years, 30 pack-year currently smoking OR have quit w/in 15years.) does not qualify.   Vision Screening: Recommended annual ophthalmology exams for early detection of glaucoma and other disorders of the eye. Is the patient up to date with their annual eye exam?  Yes   Dental Screening: Recommended annual dental exams for proper oral hygiene.  Community Resource Referral / Chronic Care Management: CRR required this visit?  No   CCM required this visit?  No      Plan:   Keep all routine maintenance appointments.   Next scheduled lab 12/10/20 @ 8:15  Follow up 01/14/21  I have personally reviewed and noted the following in the patient's chart:   Medical and social history Use of alcohol, tobacco or illicit drugs  Current medications and supplements- no opioids in use Functional ability and status Nutritional status Physical activity Advanced directives List of other physicians Hospitalizations, surgeries, and ER visits in previous 12 months Vitals Screenings to  include cognitive, depression, and falls Referrals and appointments  In addition, I have reviewed and discussed with patient certain preventive protocols, quality metrics, and best practice recommendations. A written personalized care plan for preventive services as well as general preventive health recommendations were provided to patient via mychart.     OBrien-Blaney, Denisa L, LPN   [id number]     I have reviewed the above information and agree with above.   Deborra Medina, MD

## 2020-12-01 DIAGNOSIS — R0981 Nasal congestion: Secondary | ICD-10-CM | POA: Diagnosis not present

## 2020-12-01 DIAGNOSIS — Z20822 Contact with and (suspected) exposure to covid-19: Secondary | ICD-10-CM | POA: Diagnosis not present

## 2020-12-08 ENCOUNTER — Encounter: Payer: Self-pay | Admitting: Emergency Medicine

## 2020-12-08 ENCOUNTER — Ambulatory Visit: Payer: Medicare PPO | Admitting: Internal Medicine

## 2020-12-08 ENCOUNTER — Emergency Department: Payer: Medicare PPO

## 2020-12-08 ENCOUNTER — Other Ambulatory Visit: Payer: Self-pay

## 2020-12-08 ENCOUNTER — Emergency Department
Admission: EM | Admit: 2020-12-08 | Discharge: 2020-12-08 | Disposition: A | Discharge status: Home / Self Care | Payer: Medicare PPO | Attending: Emergency Medicine | Admitting: Emergency Medicine

## 2020-12-08 DIAGNOSIS — N183 Chronic kidney disease, stage 3 unspecified: Secondary | ICD-10-CM | POA: Diagnosis not present

## 2020-12-08 DIAGNOSIS — R079 Chest pain, unspecified: Secondary | ICD-10-CM | POA: Diagnosis not present

## 2020-12-08 DIAGNOSIS — K449 Diaphragmatic hernia without obstruction or gangrene: Secondary | ICD-10-CM | POA: Diagnosis not present

## 2020-12-08 DIAGNOSIS — J9811 Atelectasis: Secondary | ICD-10-CM | POA: Diagnosis not present

## 2020-12-08 DIAGNOSIS — Z7982 Long term (current) use of aspirin: Secondary | ICD-10-CM | POA: Insufficient documentation

## 2020-12-08 DIAGNOSIS — I129 Hypertensive chronic kidney disease with stage 1 through stage 4 chronic kidney disease, or unspecified chronic kidney disease: Secondary | ICD-10-CM | POA: Diagnosis not present

## 2020-12-08 DIAGNOSIS — Z96653 Presence of artificial knee joint, bilateral: Secondary | ICD-10-CM | POA: Insufficient documentation

## 2020-12-08 DIAGNOSIS — Z7984 Long term (current) use of oral hypoglycemic drugs: Secondary | ICD-10-CM | POA: Insufficient documentation

## 2020-12-08 DIAGNOSIS — E1122 Type 2 diabetes mellitus with diabetic chronic kidney disease: Secondary | ICD-10-CM | POA: Diagnosis not present

## 2020-12-08 DIAGNOSIS — Z79899 Other long term (current) drug therapy: Secondary | ICD-10-CM | POA: Diagnosis not present

## 2020-12-08 DIAGNOSIS — Z794 Long term (current) use of insulin: Secondary | ICD-10-CM | POA: Diagnosis not present

## 2020-12-08 DIAGNOSIS — R0789 Other chest pain: Secondary | ICD-10-CM

## 2020-12-08 DIAGNOSIS — Z859 Personal history of malignant neoplasm, unspecified: Secondary | ICD-10-CM | POA: Diagnosis not present

## 2020-12-08 DIAGNOSIS — Z8542 Personal history of malignant neoplasm of other parts of uterus: Secondary | ICD-10-CM | POA: Diagnosis not present

## 2020-12-08 LAB — BASIC METABOLIC PANEL
Anion gap: 10 (ref 5–15)
BUN: 21 mg/dL (ref 8–23)
CO2: 23 mmol/L (ref 22–32)
Calcium: 9.2 mg/dL (ref 8.9–10.3)
Chloride: 103 mmol/L (ref 98–111)
Creatinine, Ser: 0.97 mg/dL (ref 0.44–1.00)
GFR, Estimated: >60 mL/min (ref >60)
Glucose, Bld: 174 mg/dL — ABNORMAL HIGH (ref 70–99)
Potassium: 3.7 mmol/L (ref 3.5–5.1)
Sodium: 136 mmol/L (ref 135–145)

## 2020-12-08 LAB — CBC
HCT: 36.2 % (ref 36.0–46.0)
Hemoglobin: 12 g/dL (ref 12.0–15.0)
MCH: 29.6 pg (ref 26.0–34.0)
MCHC: 33.1 g/dL (ref 30.0–36.0)
MCV: 89.4 fL (ref 80.0–100.0)
Platelets: 270 10*3/uL (ref 150–400)
RBC: 4.05 MIL/uL (ref 3.87–5.11)
RDW: 14.6 % (ref 11.5–15.5)
WBC: 4.8 10*3/uL (ref 4.0–10.5)
nRBC: 0 % (ref 0.0–0.2)

## 2020-12-08 LAB — TROPONIN I (HIGH SENSITIVITY)
Troponin I (High Sensitivity): 3 ng/L (ref <18)
Troponin I (High Sensitivity): 3 ng/L (ref <18)

## 2020-12-08 MED ORDER — SUCRALFATE 1 G PO TABS: 1.0000 g | ORAL_TABLET | Freq: Four times a day (QID) | ORAL | 0 refills | Status: DC

## 2020-12-08 NOTE — ED Triage Notes (Signed)
Pt via POV from home. Pt c/o centralized CP that started 1 hour PTA. Pt states she took 1 Nitro tab with some relief. Denies having any cardiac hx. Denies NVD. Denies SOB. Pt is A&Ox4 and NAD

## 2020-12-08 NOTE — ED Notes (Signed)
Patient reports mid sternal sharp chest pains starting around 1430, patient reports she also became diaphoretic, patient reports taking 1 sublingual nitro which decreased pain, patient reports since at the hospital pain has resolved.

## 2020-12-08 NOTE — ED Provider Notes (Signed)
[facility] Emergency Department Provider Note  ____________________________________________  Time seen: Approximately 7:06 PM  I have reviewed the triage vital signs and the nursing notes.   HISTORY  Chief Complaint Chest Pain    [name] is a 75 y.o. female with a history of Barrett's esophagus, GERD, hypertension, diabetes who comes ED complaining of chest pain that started at 230 today, sharp, nonradiating, no aggravating or alleviating factors, associated with diaphoresis.  No shortness of breath or vomiting.  Not exertional, not pleuritic.  Took 1 sublingual nitroglycerin which had been prescribed to her by her doctor, and the pain resolved prior to arrival in the ED, lasted a total of about 30 minutes.  No pain since then.  Chest discomfort is associated with a feeling of throat pain and altered swallowing sensation for a period of time.  Patient notes that she does strenuous exercise at the [facility] 3 times a week, and does not get any symptoms with this and feels energized after having strenuous workouts.      Past Medical History:  Diagnosis Date  . Allergy   . Anxiety   . Arthritis   . Barrett's esophagus determined by endoscopy   . Cancer [facility])    Uterine Cancer  . Chronic kidney disease   . Depression   . Diabetes mellitus without complication ([place])   . Difficult intubation    "not sure what happened,asleep was told had problem with intubation"  . Diverticulosis   . Fatty liver   . GERD (gastroesophageal reflux disease)   . History of bronchitis   . History of hiatal hernia   . Hyperlipidemia   . Hypertension   . Sleep apnea    Use C- PAP  . Urinary incontinence   . Varicose veins      Patient Active Problem List   Diagnosis Date Noted  . Forgetfulness 10/23/2020  . Chronic pain of right ankle 09/08/2020  . Atherosclerosis of renal artery ([place]) 07/01/2020  . Left wrist sprain, sequela 02/24/2020  . Right hand pain  02/24/2020  . Hepatic steatosis 07/18/2019  . Precordial pain 07/03/2019  . Epigastric pain 07/03/2019  . Vertigo, labyrinthine, unspecified laterality 03/30/2019  . Chronic hip pain, left 09/29/2018  . Pain of right tibia 09/29/2018  . Hyperlipidemia associated with type 2 diabetes mellitus ([place]) 03/29/2018  . Bilateral pes planus 03/29/2018  . SUI (stress urinary incontinence, female) 11/23/2017  . Vaginal atrophy 11/23/2017  . Obesity (BMI 30.0-34.9) 06/20/2017  . Carpal tunnel syndrome on right 06/20/2017  . Endometrioid adenocarcinoma of uterus ([place]) 01/19/2017  . OSA on CPAP 11/24/2016  . Cyst of ovary 11/16/2016  . Cervical stenosis (uterine cervix) 11/16/2016  . CKD (chronic kidney disease) stage 3, GFR 30-59 ml/min ([facility]) 11/07/2014  . GERD (gastroesophageal reflux disease) 12/26/2013  . Irritable bowel syndrome with constipation 12/26/2013  . Encounter for preventive health examination 10/21/2013  . Hyperlipidemia LDL goal <100 07/15/2013  . Chronic venous insufficiency 03/21/2013  . DM (diabetes mellitus), type 2 with renal complications ([place]) 07/62/2633  . Lack of libido 11/05/2012  . Barrett's esophagus 11/05/2012  . Hiatal hernia 11/05/2012  . Essential hypertension 11/05/2012     Past Surgical History:  Procedure Laterality Date  . CARDIAC CATHETERIZATION    . CHOLECYSTECTOMY    . COLONOSCOPY WITH PROPOFOL N/A 06/14/2016   Procedure: COLONOSCOPY WITH PROPOFOL;  Surgeon: Lollie Sails, MD;  Location: [facility] ENDOSCOPY;  Service: Endoscopy;  Laterality: N/A;  . ESOPHAGOGASTRODUODENOSCOPY (EGD)  WITH PROPOFOL N/A 06/14/2016   Procedure: ESOPHAGOGASTRODUODENOSCOPY (EGD) WITH PROPOFOL;  Surgeon: Lollie Sails, MD;  Location: [facility] ENDOSCOPY;  Service: Endoscopy;  Laterality: N/A;  . ESOPHAGOGASTRODUODENOSCOPY (EGD) WITH PROPOFOL N/A 06/12/2017   Procedure: ESOPHAGOGASTRODUODENOSCOPY (EGD) WITH PROPOFOL;  Surgeon: Lollie Sails, MD;  Location: [facility] ENDOSCOPY;   Service: Endoscopy;  Laterality: N/A;  . ESOPHAGOGASTRODUODENOSCOPY (EGD) WITH PROPOFOL N/A 08/25/2017   Procedure: ESOPHAGOGASTRODUODENOSCOPY (EGD) WITH PROPOFOL;  Surgeon: Lollie Sails, MD;  Location: [facility] ENDOSCOPY;  Service: Endoscopy;  Laterality: N/A;  . HYSTEROSCOPY WITH D & C N/A 01/02/2017   Procedure: DILATATION AND CURETTAGE /HYSTEROSCOPY;  Surgeon: Brayton Mars, MD;  Location: [facility] ORS;  Service: Gynecology;  Laterality: N/A;  . JOINT REPLACEMENT Right 2007   Total Knee Replacement  . JOINT REPLACEMENT Left 2004   Total Knee Replacement  . LAPAROSCOPIC HYSTERECTOMY Bilateral 03/01/2017   Procedure: HYSTERECTOMY TOTAL LAPAROSCOPIC BSO;  Surgeon: Mellody Drown, MD;  Location: [facility] ORS;  Service: Gynecology;  Laterality: Bilateral;  . ROTATOR CUFF REPAIR Right   . SENTINEL NODE BIOPSY N/A 03/01/2017   Procedure: SENTINEL NODE INJECTION AND BIOPSY;  Surgeon: Mellody Drown, MD;  Location: [facility] ORS;  Service: Gynecology;  Laterality: N/A;  . TONSILLECTOMY     as a child  . TUBAL LIGATION    . UPPER GI ENDOSCOPY     x2     Prior to Admission medications   Medication Sig Start Date End Date Taking? Authorizing Provider  sucralfate (CARAFATE) 1 g tablet Take 1 tablet (1 g total) by mouth 4 (four) times daily for 5 days. 12/08/20 12/13/20 Yes Carrie Mew, MD  ACCU-CHEK GUIDE test strip USE 1 STRIP TWICE DAILY 11/12/20   Crecencio Mc, MD  acetaminophen (TYLENOL) 500 MG tablet Take 2 tablets (1,000 mg total) by mouth every 6 (six) hours. Patient taking differently: Take 1,000 mg by mouth every 6 (six) hours as needed. 03/01/17   Ward, Honor Loh, MD  albuterol (PROVENTIL HFA;VENTOLIN HFA) 108 (90 BASE) MCG/ACT inhaler Inhale 2 puffs into the lungs every 6 (six) hours as needed for wheezing or shortness of breath.     [provider]  Ascorbic Acid 500 MG CHEW Chew by mouth.    [provider]  aspirin EC 81 MG tablet Take 81 mg by mouth every evening.      [provider]  atorvastatin (LIPITOR) 40 MG tablet Take 0.5 tablets (20 mg total) by mouth daily at 6 PM. 02/25/20   Crecencio Mc, MD  azelastine (ASTELIN) 0.1 % nasal spray Place 1 spray into both nostrils daily.    [provider]  benzonatate (TESSALON) 100 MG capsule Take 1 capsule (100 mg total) by mouth 3 (three) times daily as needed for cough. 06/02/20   Sharion Balloon, NP  Biotin 5000 MCG TABS Take by mouth 2 (two) times daily.     [provider]  blood glucose meter kit and supplies Use to check blood sugar twice daily. ICD 10: E11.29 10/28/19   Crecencio Mc, MD  Cetirizine HCl 10 MG CAPS Take by mouth.    [provider]  Cinnamon Bark POWD Take by mouth.    [provider]  COLLAGEN PO Take by mouth 2 (two) times daily. For improved fingernail strength    [provider]  docusate sodium (COLACE) 100 MG capsule Take 1 capsule (100 mg total) by mouth 2 (two) times daily. 03/01/17   Ward, Honor Loh, MD  fish oil-omega-3 fatty acids 1000 MG capsule Take 1 g by mouth every evening.     [provider]  fluticasone (FLONASE) 50 MCG/ACT nasal spray Place 1 spray into both nostrils daily. 12/06/18   Crecencio Mc, MD  furosemide (LASIX) 20 MG tablet Take 1 tablet (20 mg total) by mouth every other day. 01/02/20   Crecencio Mc, MD  insulin detemir (LEVEMIR) 100 UNIT/ML injection INJECT 20 UNITS SUBCUTANEOUSLY AT BEDTIME 11/09/20   Crecencio Mc, MD  JANUVIA 100 MG tablet TAKE 1 TABLET BY MOUTH  DAILY 11/04/20   Crecencio Mc, MD  Lancet Devices [facility]) lancets Use to test blood sugars 1 -2 times daily 04/19/17   Crecencio Mc, MD  loratadine (CLARITIN) 10 MG tablet Take 10 mg by mouth daily.    [provider]  losartan-hydrochlorothiazide (HYZAAR) 50-12.5 MG tablet TAKE 1 TABLET BY MOUTH  DAILY 11/04/20   Crecencio Mc, MD  Magnesium 250 MG TABS Take 1 tablet by mouth daily.    [provider]   meclizine (ANTIVERT) 25 MG tablet Take 1 tablet (25 mg total) by mouth 3 (three) times daily as needed for dizziness. 03/28/19   Crecencio Mc, MD  metFORMIN (GLUCOPHAGE) 500 MG tablet Take 1 tablet (500 mg total) by mouth 2 (two) times daily with a meal. 04/20/20   Crecencio Mc, MD  Misc Natural Products (NEURIVA PO) Take by mouth in the morning and at bedtime.    [provider]  Multiple Vitamin (MULTIVITAMIN) tablet Take 1 tablet by mouth daily.    [provider]  nitroGLYCERIN (NITROSTAT) 0.4 MG SL tablet Place 1 tablet (0.4 mg total) under the tongue every 5 (five) minutes as needed for chest pain. 07/03/19   Crecencio Mc, MD  NON FORMULARY medroxy progesterone one 77m pill once daily    [provider]  oxybutynin (DITROPAN-XL) 10 MG 24 hr tablet Take 1 tablet (10 mg total) by mouth daily. 04/20/20   MBjorn Loser MD  pantoprazole (PROTONIX) 40 MG tablet Take 1 tablet by mouth twice daily 11/06/20   TCrecencio Mc MD  potassium chloride SA (KLOR-CON) 20 MEQ tablet TAKE 1 TABLET BY MOUTH EVERY OTHER DAY 11/12/20   SLeone Haven MD  Probiotic Product (PROBIOTIC-10) CAPS Take 1 capsule by mouth daily.     [provider]  sertraline (ZOLOFT) 50 MG tablet Take 1 tablet (50 mg total) by mouth daily. 01/02/20   TCrecencio Mc MD  tiZANidine (ZANAFLEX) 4 MG tablet tizanidine 4 mg tablet    [provider]  verapamil (VERELAN PM) 180 MG 24 hr capsule Take 1 capsule (180 mg total) by mouth daily. 01/02/20   TCrecencio Mc MD  vitamin B-12 (CYANOCOBALAMIN) 1000 MCG tablet Take 1,000 mcg by mouth daily.    [provider]  Zinc 50 MG TABS Take by mouth daily.    [provider]     Allergies Clindamycin/lincomycin, Codeine, Lincomycin, Morphine and related, [place] [pentazocine], Lisinopril, Naloxone, and Oxycodone-acetaminophen   Family History  Problem Relation Age of Onset  . Stroke Mother   . Hypertension Mother    . Lupus Mother   . Alcohol abuse Father   . Diabetes Father   . Cancer Father        gallbladder ca into liver   . Arthritis Sister   . Diabetes Sister   . Lumbar disc disease Sister   . Hyperlipidemia Daughter   .  Hypertension Daughter   . Cancer Maternal Aunt        ovarian ca  . Diabetes Paternal Aunt   . Drug abuse Paternal Aunt   . Cancer Maternal Grandfather        colon ca  . Hypertension Brother   . Alcohol abuse Brother   . Stroke Brother     Social History Social History   Tobacco Use  . Smoking status: Never Smoker  . Smokeless tobacco: Never Used  Vaping Use  . Vaping Use: Never used  Substance Use Topics  . Alcohol use: Not Currently    Comment: rare  . Drug use: No    Review of Systems  Constitutional:   No fever or chills.  ENT:   No sore throat. No rhinorrhea. Cardiovascular: Positive chest pain as above syncope. Respiratory:   No dyspnea or cough. Gastrointestinal:   Negative for abdominal pain, vomiting and diarrhea.  Musculoskeletal:   Negative for focal pain or swelling All other systems reviewed and are negative except as documented above in ROS and HPI.  ____________________________________________   PHYSICAL EXAM:  VITAL SIGNS: ED Triage Vitals  Enc Vitals Group     BP 12/08/20 1516 139/71     Pulse Rate 12/08/20 1516 67     Resp 12/08/20 1516 20     Temp 12/08/20 1516 98.2 F (36.8 C)     Temp Source 12/08/20 1516 Oral     SpO2 12/08/20 1516 99 %     Weight 12/08/20 1517 166 lb (75.3 kg)     Height 12/08/20 1517 '5\' 1"'  (1.549 m)     Head Circumference --      Peak Flow --      Pain Score 12/08/20 1517 2     Pain Loc --      Pain Edu? --      Excl. in [place]? --     Vital signs reviewed, nursing assessments reviewed.   Constitutional:   Alert and oriented. Non-toxic appearance. Eyes:   Conjunctivae are normal. EOMI. PERRL. ENT      Head:   Normocephalic and atraumatic.      Nose: Normal.      Mouth/Throat: Mild  oropharyngeal erythema.  Moist mucosa      Neck:   No meningismus. Full ROM. Hematological/Lymphatic/Immunilogical:   No cervical lymphadenopathy. Cardiovascular:   RRR. Symmetric bilateral radial and DP pulses.  No murmurs. Cap refill less than 2 seconds. Respiratory:   Normal respiratory effort without tachypnea/retractions. Breath sounds are clear and equal bilaterally. No wheezes/rales/rhonchi. Gastrointestinal:   Soft and nontender. Non distended. There is no CVA tenderness.  No rebound, rigidity, or guarding.  Musculoskeletal:   Normal range of motion in all extremities. No joint effusions.  No lower extremity tenderness.  No edema. Neurologic:   Normal speech and language.  Motor grossly intact. No acute focal neurologic deficits are appreciated.  Skin:    Skin is warm, dry and intact. No rash noted.  No petechiae, purpura, or bullae.  ____________________________________________    LABS (pertinent positives/negatives) (all labs ordered are listed, but only abnormal results are displayed) Labs Reviewed  BASIC METABOLIC PANEL - Abnormal; Notable for the following components:      Result Value   Glucose, Bld 174 (*)    All other components within normal limits  CBC  TROPONIN I (HIGH SENSITIVITY)  TROPONIN I (HIGH SENSITIVITY)   ____________________________________________   EKG  Interpreted by me Normal sinus rhythm rate of 69,  normal axis and intervals.  Normal QRS ST segments and T waves.  ____________________________________________    [id number]  DG Chest 2 View  Result Date: 12/08/2020 CLINICAL DATA:  Chest pain. EXAM: CHEST - 2 VIEW COMPARISON:  Jan 30, 2017 FINDINGS: Very mild linear atelectasis is seen within the left lung base. There is no evidence of a pleural effusion or pneumothorax. The heart size and mediastinal contours are within normal limits. A moderate sized hiatal hernia is seen. Radiopaque surgical clips are seen within the right upper quadrant. The  visualized skeletal structures are unremarkable. IMPRESSION: 1. Very mild left basilar linear atelectasis. 2. Moderate sized hiatal hernia. Electronically Signed   By: Virgina Norfolk M.D.   On: 12/08/2020 16:04    ____________________________________________   PROCEDURES Procedures  ____________________________________________    CLINICAL IMPRESSION / ASSESSMENT AND PLAN / ED COURSE  Medications ordered in the ED: Medications - No data to display  Pertinent labs & imaging results that were available during my care of the patient were reviewed by me and considered in my medical decision making (see chart for details).  Sandra Hendrix was evaluated in Emergency Department on 12/08/2020 for the symptoms described in the history of present illness. She was evaluated in the context of the global COVID-19 pandemic, which necessitated consideration that the patient might be at risk for infection with the SARS-CoV-2 virus that causes COVID-19. Institutional protocols and algorithms that pertain to the evaluation of patients at risk for COVID-19 are in a state of rapid change based on information released by regulatory bodies including the CDC and federal and state organizations. These policies and algorithms were followed during the patient's care in the ED.   Patient presents with atypical chest pain. Considering the patient's symptoms, medical history, and physical examination today, I have low suspicion for ACS, PE, TAD, pneumothorax, carditis, mediastinitis, pneumonia, CHF, or sepsis.  Vital signs and exam are reassuring.  Due to age, comorbidities including hypertension hyperlipidemia and diabetes, serial troponins were obtained which were also normal, stable for discharge home.      ____________________________________________   FINAL CLINICAL IMPRESSION(S) / ED DIAGNOSES    Final diagnoses:  Atypical chest pain     ED Discharge Orders         Ordered    sucralfate  (CARAFATE) 1 g tablet  4 times daily        12/08/20 1906          Portions of this note were generated with dragon dictation software. Dictation errors may occur despite best attempts at proofreading.   Carrie Mew, MD 12/08/20 Pauline Aus

## 2020-12-10 ENCOUNTER — Other Ambulatory Visit: Payer: Self-pay

## 2020-12-10 ENCOUNTER — Other Ambulatory Visit (INDEPENDENT_AMBULATORY_CARE_PROVIDER_SITE_OTHER): Payer: Medicare PPO

## 2020-12-10 DIAGNOSIS — R6889 Other general symptoms and signs: Secondary | ICD-10-CM

## 2020-12-10 DIAGNOSIS — E1121 Type 2 diabetes mellitus with diabetic nephropathy: Secondary | ICD-10-CM | POA: Diagnosis not present

## 2020-12-10 DIAGNOSIS — E785 Hyperlipidemia, unspecified: Secondary | ICD-10-CM | POA: Diagnosis not present

## 2020-12-10 DIAGNOSIS — Z794 Long term (current) use of insulin: Secondary | ICD-10-CM | POA: Diagnosis not present

## 2020-12-10 LAB — HEMOGLOBIN A1C: Hgb A1c MFr Bld: 7.6 % — ABNORMAL HIGH (ref 4.6–6.5)

## 2020-12-10 LAB — COMPREHENSIVE METABOLIC PANEL
ALT: 14 U/L (ref 0–35)
AST: 16 U/L (ref 0–37)
Albumin: 4 g/dL (ref 3.5–5.2)
Alkaline Phosphatase: 116 U/L (ref 39–117)
BUN: 24 mg/dL — ABNORMAL HIGH (ref 6–23)
CO2: 27 mEq/L (ref 19–32)
Calcium: 9.2 mg/dL (ref 8.4–10.5)
Chloride: 103 mEq/L (ref 96–112)
Creatinine, Ser: 0.9 mg/dL (ref 0.40–1.20)
GFR: 62.03 mL/min (ref >60.00)
Glucose, Bld: 114 mg/dL — ABNORMAL HIGH (ref 70–99)
Potassium: 3.4 mEq/L — ABNORMAL LOW (ref 3.5–5.1)
Sodium: 142 mEq/L (ref 135–145)
Total Bilirubin: 0.5 mg/dL (ref 0.2–1.2)
Total Protein: 7 g/dL (ref 6.0–8.3)

## 2020-12-10 LAB — LIPID PANEL
Cholesterol: 170 mg/dL (ref 0–200)
HDL: 80.1 mg/dL (ref >39.00)
LDL Cholesterol: 76 mg/dL (ref 0–99)
NonHDL: 90.06
Total CHOL/HDL Ratio: 2
Triglycerides: 70 mg/dL (ref 0.0–149.0)
VLDL: 14 mg/dL (ref 0.0–40.0)

## 2020-12-10 LAB — TSH: TSH: 2.95 u[IU]/mL (ref 0.35–4.50)

## 2020-12-10 LAB — VITAMIN B12: Vitamin B-12: 1386 pg/mL — ABNORMAL HIGH (ref 211–911)

## 2020-12-13 ENCOUNTER — Other Ambulatory Visit: Payer: Self-pay | Admitting: Internal Medicine

## 2020-12-13 DIAGNOSIS — E1121 Type 2 diabetes mellitus with diabetic nephropathy: Secondary | ICD-10-CM

## 2020-12-13 NOTE — Assessment & Plan Note (Signed)
Advised to increase lantus by 3 units   Lab Results  Component Value Date   HGBA1C 7.6 (H) 12/10/2020

## 2020-12-14 ENCOUNTER — Ambulatory Visit: Payer: Medicare PPO | Admitting: Internal Medicine

## 2020-12-14 ENCOUNTER — Encounter: Payer: Self-pay | Admitting: Internal Medicine

## 2020-12-14 ENCOUNTER — Other Ambulatory Visit: Payer: Self-pay

## 2020-12-14 VITALS — BP 118/76 | HR 77 | Temp 98.2°F | Resp 15 | Ht 61.0 in | Wt 169.0 lb

## 2020-12-14 DIAGNOSIS — J411 Mucopurulent chronic bronchitis: Secondary | ICD-10-CM

## 2020-12-14 DIAGNOSIS — R0789 Other chest pain: Secondary | ICD-10-CM

## 2020-12-14 NOTE — Assessment & Plan Note (Signed)
Present for over one year,  With recent chest film noting moderate HH and LLL atx  Referring to pulmonolgy

## 2020-12-14 NOTE — Patient Instructions (Signed)
Please continue to take 15 units of Levemir  Please check your blood sugars once daily ar various tims:  1) fasting and 2) 2 hours after a meal (you can vary from day to day which meal you check)  Send me your blood sugars in 2 weeks through Mychart or fax/call the office. If your sugars are still too high we will need to change your medications or add additional medications depending on your blood sugars.     Referral to pulmonology is in process

## 2020-12-14 NOTE — Progress Notes (Signed)
Subjective:  Patient ID: Sandra Hendrix, female    DOB: [birth year]  Age: 75 y.o. MRN: [id number]  CC: The primary encounter diagnosis was Mucopurulent chronic bronchitis ([place]). A diagnosis of Atypical chest pain was also pertinent to this visit.  [name] presents for ER follow up on chest pai attributed to GERD/esophagitis  This visit occurred during the SARS-CoV-2 public health emergency.  Safety protocols were in place, including screening questions prior to the visit, additional usage of staff PPE, and extensive cleaning of exam room while observing appropriate contact time as indicated for disinfecting solutions.   75 yr old female with Type 2 DM, hypertension and hyperlipidemia presents for ER follow up.  She presened to the ER on March 15 with a history of SSCP that started at 2 pm at rest on the day of evaluation  and became progressively worse accompanied by diaphoresis .  She took a  SL NTG tablet,  and by time of ER evaluation her pain had scompletely resolved.  She was evaluted with a chest  Xray,  12 lead EKG and two troponin levels drawn at 3:25 pm and 6 pm on the same day .  She was treated for GERD with esophagitis and sent hone with a prescription for sucralfate.    Her pain has not recurred,  She has changed her medication routine because she thinks it  may have been caused by coadministration of detrol with protonix  She is trying to comply with taking  sucralfate qid but finds it quite challenging.  She has been taking Protonix twice daily for many months;  She has a history of Barrett's esophagus .  She is scheduled for a colonoscopy this Friday,  With no EGD planned.  She has not notified her Gastroenterologist of the ER visit   She recently underwent a barium swallow to evaluate a persistent  productive cough  That has been bothering her for over one year.  Morning phlegm production  Is the worst.     She is exercising 3 times weekly and denies chest pain  and cough during exertion. She .  Has seen ENT and sees allergist  .  Wants to see a pulmonologst.     Outpatient Medications Prior to Visit  Medication Sig Dispense Refill  . ACCU-CHEK GUIDE test strip USE 1 STRIP TWICE DAILY 50 each 0  . acetaminophen (TYLENOL) 500 MG tablet Take 2 tablets (1,000 mg total) by mouth every 6 (six) hours. (Patient taking differently: Take 1,000 mg by mouth every 6 (six) hours as needed.) 65 tablet 1  . albuterol (PROVENTIL HFA;VENTOLIN HFA) 108 (90 BASE) MCG/ACT inhaler Inhale 2 puffs into the lungs every 6 (six) hours as needed for wheezing or shortness of breath.     . Ascorbic Acid 500 MG CHEW Chew by mouth.    Marland Kitchen aspirin EC 81 MG tablet Take 81 mg by mouth every evening.     Marland Kitchen atorvastatin (LIPITOR) 40 MG tablet Take 0.5 tablets (20 mg total) by mouth daily at 6 PM. 90 tablet 3  . azelastine (ASTELIN) 0.1 % nasal spray Place 1 spray into both nostrils daily.    . benzonatate (TESSALON) 100 MG capsule Take 1 capsule (100 mg total) by mouth 3 (three) times daily as needed for cough. 21 capsule 0  . Biotin 5000 MCG TABS Take by mouth 2 (two) times daily.     . blood glucose meter kit and supplies Use to check blood sugar  twice daily. ICD 10: E11.29 1 each 0  . Cetirizine HCl 10 MG CAPS Take by mouth.    Sandra Hendrix Bark POWD Take by mouth.    . COLLAGEN PO Take by mouth 2 (two) times daily. For improved fingernail strength    . docusate sodium (COLACE) 100 MG capsule Take 1 capsule (100 mg total) by mouth 2 (two) times daily. 20 capsule 0  . fish oil-omega-3 fatty acids 1000 MG capsule Take 1 g by mouth every evening.     . fluticasone (FLONASE) 50 MCG/ACT nasal spray Place 1 spray into both nostrils daily. 48 g 1  . furosemide (LASIX) 20 MG tablet Take 1 tablet (20 mg total) by mouth every other day. 45 tablet 3  . insulin detemir (LEVEMIR) 100 UNIT/ML injection INJECT 20 UNITS SUBCUTANEOUSLY AT BEDTIME (Patient taking differently: INJECT 15 UNITS  SUBCUTANEOUSLY AT BEDTIME) 10 mL 2  . JANUVIA 100 MG tablet TAKE 1 TABLET BY MOUTH  DAILY 90 tablet 3  . Lancet Devices (ACCU-CHEK SOFTCLIX) lancets Use to test blood sugars 1 -2 times daily 1 each 3  . loratadine (CLARITIN) 10 MG tablet Take 10 mg by mouth daily.    Marland Kitchen losartan-hydrochlorothiazide (HYZAAR) 50-12.5 MG tablet TAKE 1 TABLET BY MOUTH  DAILY 90 tablet 3  . Magnesium 250 MG TABS Take 1 tablet by mouth daily.    . meclizine (ANTIVERT) 25 MG tablet Take 1 tablet (25 mg total) by mouth 3 (three) times daily as needed for dizziness. 30 tablet 3  . metFORMIN (GLUCOPHAGE) 500 MG tablet Take 1 tablet (500 mg total) by mouth 2 (two) times daily with a meal. 180 tablet 2  . Misc Natural Products (NEURIVA PO) Take by mouth in the morning and at bedtime.    . Multiple Vitamin (MULTIVITAMIN) tablet Take 1 tablet by mouth daily.    . nitroGLYCERIN (NITROSTAT) 0.4 MG SL tablet Place 1 tablet (0.4 mg total) under the tongue every 5 (five) minutes as needed for chest pain. 50 tablet 3  . oxybutynin (DITROPAN-XL) 10 MG 24 hr tablet Take 1 tablet (10 mg total) by mouth daily. 90 tablet 3  . pantoprazole (PROTONIX) 40 MG tablet Take 1 tablet by mouth twice daily 180 tablet 1  . potassium chloride SA (KLOR-CON) 20 MEQ tablet TAKE 1 TABLET BY MOUTH EVERY OTHER DAY 45 tablet 0  . Probiotic Product (PROBIOTIC-10) CAPS Take 1 capsule by mouth daily.     . sertraline (ZOLOFT) 50 MG tablet Take 1 tablet (50 mg total) by mouth daily. 90 tablet 3  . tiZANidine (ZANAFLEX) 4 MG tablet tizanidine 4 mg tablet    . verapamil (VERELAN PM) 180 MG 24 hr capsule Take 1 capsule (180 mg total) by mouth daily. 90 capsule 3  . vitamin B-12 (CYANOCOBALAMIN) 1000 MCG tablet Take 1,000 mcg by mouth daily.    . Zinc 50 MG TABS Take by mouth daily.    . sucralfate (CARAFATE) 1 g tablet Take 1 tablet (1 g total) by mouth 4 (four) times daily for 5 days. 120 tablet 0  . NON FORMULARY medroxy progesterone one 8m pill once daily  (Patient not taking: Reported on 12/14/2020)     No facility-administered medications prior to visit.    Review of Systems;  Patient denies headache, fevers, malaise, unintentional weight loss, skin rash, eye pain, sinus congestion and sinus pain, sore throat, dysphagia,  hemoptysis , cough, dyspnea, wheezing, chest pain, palpitations, orthopnea, edema, abdominal pain, nausea, melena, diarrhea, constipation,  flank pain, dysuria, hematuria, urinary  Frequency, nocturia, numbness, tingling, seizures,  Focal weakness, Loss of consciousness,  Tremor, insomnia, depression, anxiety, and suicidal ideation.      Objective:  BP 118/76 (BP Location: Left Arm, Patient Position: Sitting, Cuff Size: Normal)   Pulse 77   Temp 98.2 F (36.8 C) (Oral)   Resp 15   Ht '5\' 1"'  (1.549 m)   Wt 169 lb (76.7 kg)   SpO2 95%   BMI 31.93 kg/m   BP Readings from Last 3 Encounters:  12/14/20 118/76  12/08/20 117/66  09/16/20 138/78    Wt Readings from Last 3 Encounters:  12/14/20 169 lb (76.7 kg)  12/08/20 166 lb (75.3 kg)  11/23/20 167 lb (75.8 kg)    General appearance: alert, cooperative and appears stated age Ears: normal TM's and external ear canals both ears Throat: lips, mucosa, and tongue normal; teeth and gums normal Neck: no adenopathy, no carotid bruit, supple, symmetrical, trachea midline and thyroid not enlarged, symmetric, no tenderness/mass/nodules Back: symmetric, no curvature. ROM normal. No CVA tenderness. Lungs: clear to auscultation bilaterally Heart: regular rate and rhythm, S1, S2 normal, no murmur, click, rub or gallop Abdomen: soft, non-tender; bowel sounds normal; no masses,  no organomegaly Pulses: 2+ and symmetric Skin: Skin color, texture, turgor normal. No rashes or lesions Lymph nodes: Cervical, supraclavicular, and axillary nodes normal.  Lab Results  Component Value Date   HGBA1C 7.6 (H) 12/10/2020   HGBA1C 8.0 (H) 09/09/2020   HGBA1C 7.3 (H) 05/15/2020    Lab  Results  Component Value Date   CREATININE 0.90 12/10/2020   CREATININE 0.97 12/08/2020   CREATININE 0.93 09/09/2020    Lab Results  Component Value Date   WBC 4.8 12/08/2020   HGB 12.0 12/08/2020   HCT 36.2 12/08/2020   PLT 270 12/08/2020   GLUCOSE 114 (H) 12/10/2020   CHOL 170 12/10/2020   TRIG 70.0 12/10/2020   HDL 80.10 12/10/2020   LDLDIRECT 80.0 02/22/2017   LDLCALC 76 12/10/2020   ALT 14 12/10/2020   AST 16 12/10/2020   NA 142 12/10/2020   K 3.4 (L) 12/10/2020   CL 103 12/10/2020   CREATININE 0.90 12/10/2020   BUN 24 (H) 12/10/2020   CO2 27 12/10/2020   TSH 2.95 12/10/2020   INR 1.1 01/23/2009   HGBA1C 7.6 (H) 12/10/2020   MICROALBUR <0.7 01/14/2020    DG Chest 2 View  Result Date: 12/08/2020 CLINICAL DATA:  Chest pain. EXAM: CHEST - 2 VIEW COMPARISON:  Jan 30, 2017 FINDINGS: Very mild linear atelectasis is seen within the left lung base. There is no evidence of a pleural effusion or pneumothorax. The heart size and mediastinal contours are within normal limits. A moderate sized hiatal hernia is seen. Radiopaque surgical clips are seen within the right upper quadrant. The visualized skeletal structures are unremarkable. IMPRESSION: 1. Very mild left basilar linear atelectasis. 2. Moderate sized hiatal hernia. Electronically Signed   By: Virgina Norfolk M.D.   On: 12/08/2020 16:04    Assessment & Plan:   Problem List Items Addressed This Visit      Unprioritized   Mucopurulent chronic bronchitis ([place]) - Primary    Present for over one year,  With recent chest film noting moderate HH and LLL atx  Referring to pulmonolgy      Relevant Orders   Ambulatory referral to Pulmonology   Atypical chest pain    Attributed to reflux esophagitis by ER physician, des[ite patient taking PPI bid.  troponins were checked but timing was only 1 to 4 hours after onset of pain,  Making me less comfortable with exclusion of CAD given her multiple risk FACTORS. Will contact her  gastroenterologist about converting her upcoming colonoscopy to an EGD/colon given her history of barrett's and recommend  Cardiology referral          I have discontinued Lowana P. Delsignore's NON FORMULARY. I am also having her maintain her multivitamin, fish oil-omega-3 fatty acids, albuterol, aspirin EC, Probiotic-10, loratadine, azelastine, Biotin, acetaminophen, docusate sodium, accu-chek softclix, vitamin B-12, Cinnamon Bark, Magnesium, COLLAGEN PO, fluticasone, meclizine, nitroGLYCERIN, Zinc, tiZANidine, Cetirizine HCl, Ascorbic Acid, blood glucose meter kit and supplies, verapamil, sertraline, furosemide, atorvastatin, metFORMIN, oxybutynin, benzonatate, Misc Natural Products (NEURIVA PO), losartan-hydrochlorothiazide, Januvia, pantoprazole, insulin detemir, Accu-Chek Guide, potassium chloride SA, and sucralfate.  No orders of the defined types were placed in this encounter.   Medications Discontinued During This Encounter  Medication Reason  . NON FORMULARY     Follow-up: Return in about 3 months (around 03/16/2021) for follow up diabetes.   Crecencio Mc, MD

## 2020-12-15 DIAGNOSIS — R0789 Other chest pain: Secondary | ICD-10-CM | POA: Insufficient documentation

## 2020-12-15 NOTE — Telephone Encounter (Signed)
After reviewing the ER notes more fully, I am not as convinced as the ER was about the  Diagnosis they mae,   because the cardiac labs were done too soon after the onset of chest pain .  I wold like her to see a cardiologist.  I have also reached out to Dr Aundria Mems to change the colonoscopy to an EGD/colonoscopy but have not heard back yet.   Do you agree to see a cardiologist, and if so do you have a preference?  Regards,   Deborra Medina, MD

## 2020-12-15 NOTE — Assessment & Plan Note (Signed)
Attributed to reflux esophagitis by ER physician, des[ite patient taking PPI bid.  troponins were checked but timing was only 1 to 4 hours after onset of pain,  Making me less comfortable with exclusion of CAD given her multiple risk FACTORS. Will contact her gastroenterologist about converting her upcoming colonoscopy to an EGD/colon given her history of barrett's and recommend  Cardiology referral

## 2020-12-16 ENCOUNTER — Other Ambulatory Visit
Admission: RE | Admit: 2020-12-16 | Discharge: 2020-12-16 | Disposition: A | Discharge status: Home / Self Care | Payer: Medicare PPO | Source: Ambulatory Visit | Attending: Gastroenterology | Admitting: Gastroenterology

## 2020-12-16 ENCOUNTER — Other Ambulatory Visit: Payer: Self-pay

## 2020-12-16 DIAGNOSIS — Z01812 Encounter for preprocedural laboratory examination: Secondary | ICD-10-CM | POA: Insufficient documentation

## 2020-12-16 DIAGNOSIS — Z20822 Contact with and (suspected) exposure to covid-19: Secondary | ICD-10-CM | POA: Insufficient documentation

## 2020-12-16 LAB — SARS CORONAVIRUS 2 (TAT 6-24 HRS): SARS Coronavirus 2: NEGATIVE

## 2020-12-16 NOTE — Addendum Note (Signed)
Addended by: Crecencio Mc on: 12/16/2020 10:31 PM   Modules accepted: Orders

## 2020-12-17 ENCOUNTER — Ambulatory Visit: Payer: Medicare PPO | Admitting: Internal Medicine

## 2020-12-18 ENCOUNTER — Encounter
Admission: RE | Disposition: A | Discharge status: Home / Self Care | Payer: Self-pay | Source: Home / Self Care | Attending: Gastroenterology

## 2020-12-18 ENCOUNTER — Ambulatory Visit: Payer: Medicare PPO | Admitting: Registered Nurse

## 2020-12-18 ENCOUNTER — Ambulatory Visit
Admission: RE | Admit: 2020-12-18 | Discharge: 2020-12-18 | Disposition: A | Discharge status: Home / Self Care | Payer: Medicare PPO | Attending: Gastroenterology | Admitting: Gastroenterology

## 2020-12-18 ENCOUNTER — Encounter: Payer: Self-pay | Admitting: *Deleted

## 2020-12-18 DIAGNOSIS — Z1211 Encounter for screening for malignant neoplasm of colon: Secondary | ICD-10-CM | POA: Diagnosis not present

## 2020-12-18 DIAGNOSIS — Z881 Allergy status to other antibiotic agents status: Secondary | ICD-10-CM | POA: Insufficient documentation

## 2020-12-18 DIAGNOSIS — K2970 Gastritis, unspecified, without bleeding: Secondary | ICD-10-CM | POA: Diagnosis not present

## 2020-12-18 DIAGNOSIS — Z7982 Long term (current) use of aspirin: Secondary | ICD-10-CM | POA: Insufficient documentation

## 2020-12-18 DIAGNOSIS — Z794 Long term (current) use of insulin: Secondary | ICD-10-CM | POA: Diagnosis not present

## 2020-12-18 DIAGNOSIS — Z7984 Long term (current) use of oral hypoglycemic drugs: Secondary | ICD-10-CM | POA: Diagnosis not present

## 2020-12-18 DIAGNOSIS — Z800 Family history of malignant neoplasm of digestive organs: Secondary | ICD-10-CM | POA: Insufficient documentation

## 2020-12-18 DIAGNOSIS — K3189 Other diseases of stomach and duodenum: Secondary | ICD-10-CM | POA: Diagnosis not present

## 2020-12-18 DIAGNOSIS — Q399 Congenital malformation of esophagus, unspecified: Secondary | ICD-10-CM | POA: Insufficient documentation

## 2020-12-18 DIAGNOSIS — K5730 Diverticulosis of large intestine without perforation or abscess without bleeding: Secondary | ICD-10-CM | POA: Insufficient documentation

## 2020-12-18 DIAGNOSIS — Z8542 Personal history of malignant neoplasm of other parts of uterus: Secondary | ICD-10-CM | POA: Diagnosis not present

## 2020-12-18 DIAGNOSIS — K2950 Unspecified chronic gastritis without bleeding: Secondary | ICD-10-CM | POA: Insufficient documentation

## 2020-12-18 DIAGNOSIS — K640 First degree hemorrhoids: Secondary | ICD-10-CM | POA: Diagnosis not present

## 2020-12-18 DIAGNOSIS — K649 Unspecified hemorrhoids: Secondary | ICD-10-CM | POA: Diagnosis not present

## 2020-12-18 DIAGNOSIS — Z885 Allergy status to narcotic agent status: Secondary | ICD-10-CM | POA: Insufficient documentation

## 2020-12-18 DIAGNOSIS — I129 Hypertensive chronic kidney disease with stage 1 through stage 4 chronic kidney disease, or unspecified chronic kidney disease: Secondary | ICD-10-CM | POA: Diagnosis not present

## 2020-12-18 DIAGNOSIS — Z79899 Other long term (current) drug therapy: Secondary | ICD-10-CM | POA: Insufficient documentation

## 2020-12-18 DIAGNOSIS — Z888 Allergy status to other drugs, medicaments and biological substances status: Secondary | ICD-10-CM | POA: Insufficient documentation

## 2020-12-18 DIAGNOSIS — K449 Diaphragmatic hernia without obstruction or gangrene: Secondary | ICD-10-CM | POA: Insufficient documentation

## 2020-12-18 DIAGNOSIS — G4733 Obstructive sleep apnea (adult) (pediatric): Secondary | ICD-10-CM | POA: Diagnosis not present

## 2020-12-18 DIAGNOSIS — N183 Chronic kidney disease, stage 3 unspecified: Secondary | ICD-10-CM | POA: Diagnosis not present

## 2020-12-18 DIAGNOSIS — E1122 Type 2 diabetes mellitus with diabetic chronic kidney disease: Secondary | ICD-10-CM | POA: Diagnosis not present

## 2020-12-18 DIAGNOSIS — Z8719 Personal history of other diseases of the digestive system: Secondary | ICD-10-CM | POA: Diagnosis not present

## 2020-12-18 DIAGNOSIS — K5790 Diverticulosis of intestine, part unspecified, without perforation or abscess without bleeding: Secondary | ICD-10-CM | POA: Diagnosis not present

## 2020-12-18 DIAGNOSIS — R1013 Epigastric pain: Secondary | ICD-10-CM | POA: Diagnosis not present

## 2020-12-18 HISTORY — DX: Venous insufficiency (chronic) (peripheral): I87.2

## 2020-12-18 HISTORY — PX: COLONOSCOPY WITH PROPOFOL: SHX5780

## 2020-12-18 HISTORY — PX: ESOPHAGOGASTRODUODENOSCOPY: SHX5428

## 2020-12-18 HISTORY — DX: Irritable bowel syndrome with constipation: K58.1

## 2020-12-18 LAB — GLUCOSE, CAPILLARY: Glucose-Capillary: 157 mg/dL — ABNORMAL HIGH (ref 70–99)

## 2020-12-18 SURGERY — COLONOSCOPY WITH PROPOFOL
Anesthesia: General

## 2020-12-18 MED ORDER — EPHEDRINE 5 MG/ML INJ
INTRAVENOUS | Status: AC
  Filled 2020-12-18: qty 10

## 2020-12-18 MED ORDER — PROPOFOL 10 MG/ML IV BOLUS
INTRAVENOUS | Status: DC | PRN
  Administered 2020-12-18: 60 mg via INTRAVENOUS

## 2020-12-18 MED ORDER — EPHEDRINE SULFATE 50 MG/ML IJ SOLN
INTRAMUSCULAR | Status: DC | PRN
  Administered 2020-12-18: 5 mg via INTRAVENOUS
  Administered 2020-12-18: 10 mg via INTRAVENOUS

## 2020-12-18 MED ORDER — PROPOFOL 500 MG/50ML IV EMUL
INTRAVENOUS | Status: DC | PRN
  Administered 2020-12-18: 150 ug/kg/min via INTRAVENOUS

## 2020-12-18 MED ORDER — SODIUM CHLORIDE 0.9 % IV SOLN
INTRAVENOUS | Status: DC
  Administered 2020-12-18: 20 mL/h via INTRAVENOUS

## 2020-12-18 MED ORDER — LIDOCAINE HCL (CARDIAC) PF 100 MG/5ML IV SOSY
PREFILLED_SYRINGE | INTRAVENOUS | Status: DC | PRN
  Administered 2020-12-18: 100 mg via INTRAVENOUS

## 2020-12-18 NOTE — H&P (Signed)
Outpatient short stay form Pre-procedure 12/18/2020 10:55 AM Sandra Miyamoto MD, MPH  Primary Physician: Dr. Derrel Nip  Reason for visit:  Epigastric pain/Screening  History of present illness:   75 y/o lady with personal history of uterine cancer and family history of colon cancer in a grandfather who is here for EGD for epigastric pain and colonoscopy for screening. History of hysterectomy. No blood thinners. No other significant symptoms.    Current Facility-Administered Medications:  .  0.9 %  sodium chloride infusion, , Intravenous, Continuous, Timm Bonenberger, Hilton Cork, MD, Last Rate: 20 mL/hr at 12/18/20 1035, 20 mL/hr at 12/18/20 1035  Medications Prior to Admission  Medication Sig Dispense Refill Last Dose  . ACCU-CHEK GUIDE test strip USE 1 STRIP TWICE DAILY 50 each 0 Past Week at Unknown time  . acetaminophen (TYLENOL) 500 MG tablet Take 2 tablets (1,000 mg total) by mouth every 6 (six) hours. (Patient taking differently: Take 1,000 mg by mouth every 6 (six) hours as needed.) 65 tablet 1 Past Week at Unknown time  . Ascorbic Acid 500 MG CHEW Chew by mouth.   Past Week at Unknown time  . aspirin EC 81 MG tablet Take 81 mg by mouth every evening.    Past Week at Unknown time  . atorvastatin (LIPITOR) 40 MG tablet Take 0.5 tablets (20 mg total) by mouth daily at 6 PM. 90 tablet 3 Past Week at Unknown time  . azelastine (ASTELIN) 0.1 % nasal spray Place 1 spray into both nostrils daily.   Past Week at Unknown time  . benzonatate (TESSALON) 100 MG capsule Take 1 capsule (100 mg total) by mouth 3 (three) times daily as needed for cough. 21 capsule 0 Past Week at Unknown time  . Biotin 5000 MCG TABS Take by mouth 2 (two) times daily.    Past Week at Unknown time  . blood glucose meter kit and supplies Use to check blood sugar twice daily. ICD 10: E11.29 1 each 0 Past Week at Unknown time  . Cetirizine HCl 10 MG CAPS Take by mouth.   Past Week at Unknown time  . Cinnamon Bark POWD Take by mouth.    Past Week at Unknown time  . COLLAGEN PO Take by mouth 2 (two) times daily. For improved fingernail strength   Past Week at Unknown time  . docusate sodium (COLACE) 100 MG capsule Take 1 capsule (100 mg total) by mouth 2 (two) times daily. 20 capsule 0 Past Week at Unknown time  . fish oil-omega-3 fatty acids 1000 MG capsule Take 1 g by mouth every evening.    Past Week at Unknown time  . fluticasone (FLONASE) 50 MCG/ACT nasal spray Place 1 spray into both nostrils daily. 48 g 1 Past Week at Unknown time  . furosemide (LASIX) 20 MG tablet Take 1 tablet (20 mg total) by mouth every other day. 45 tablet 3 Past Week at Unknown time  . insulin detemir (LEVEMIR) 100 UNIT/ML injection INJECT 20 UNITS SUBCUTANEOUSLY AT BEDTIME (Patient taking differently: INJECT 15 UNITS SUBCUTANEOUSLY AT BEDTIME) 10 mL 2 Past Week at Unknown time  . JANUVIA 100 MG tablet TAKE 1 TABLET BY MOUTH  DAILY 90 tablet 3 Past Week at Unknown time  . Lancet Devices (ACCU-CHEK SOFTCLIX) lancets Use to test blood sugars 1 -2 times daily 1 each 3 Past Week at Unknown time  . loratadine (CLARITIN) 10 MG tablet Take 10 mg by mouth daily.   Past Week at Unknown time  . losartan-hydrochlorothiazide (HYZAAR) 50-12.5  MG tablet TAKE 1 TABLET BY MOUTH  DAILY 90 tablet 3 Past Week at Unknown time  . Magnesium 250 MG TABS Take 1 tablet by mouth daily.   Past Week at Unknown time  . meclizine (ANTIVERT) 25 MG tablet Take 1 tablet (25 mg total) by mouth 3 (three) times daily as needed for dizziness. 30 tablet 3 Past Week at Unknown time  . metFORMIN (GLUCOPHAGE) 500 MG tablet Take 1 tablet (500 mg total) by mouth 2 (two) times daily with a meal. 180 tablet 2 Past Week at Unknown time  . Misc Natural Products (NEURIVA PO) Take by mouth in the morning and at bedtime.   Past Week at Unknown time  . Multiple Vitamin (MULTIVITAMIN) tablet Take 1 tablet by mouth daily.   Past Week at Unknown time  . nitroGLYCERIN (NITROSTAT) 0.4 MG SL tablet Place 1  tablet (0.4 mg total) under the tongue every 5 (five) minutes as needed for chest pain. 50 tablet 3 Past Week at Unknown time  . omega-3 acid ethyl esters (LOVAZA) 1 g capsule Take 1 g by mouth daily.   Past Week at Unknown time  . OMEGA-3 FATTY ACIDS-VITAMIN E PO Take by mouth.   Past Week at Unknown time  . oxybutynin (DITROPAN-XL) 10 MG 24 hr tablet Take 1 tablet (10 mg total) by mouth daily. 90 tablet 3 Past Week at Unknown time  . pantoprazole (PROTONIX) 40 MG tablet Take 1 tablet by mouth twice daily 180 tablet 1 Past Week at Unknown time  . potassium chloride SA (KLOR-CON) 20 MEQ tablet TAKE 1 TABLET BY MOUTH EVERY OTHER DAY 45 tablet 0 Past Week at Unknown time  . Probiotic Product (PROBIOTIC-10) CAPS Take 1 capsule by mouth daily.    Past Week at Unknown time  . sertraline (ZOLOFT) 50 MG tablet Take 1 tablet (50 mg total) by mouth daily. 90 tablet 3 Past Week at Unknown time  . tiZANidine (ZANAFLEX) 4 MG tablet tizanidine 4 mg tablet   Past Week at Unknown time  . verapamil (VERELAN PM) 180 MG 24 hr capsule Take 1 capsule (180 mg total) by mouth daily. 90 capsule 3 Past Week at Unknown time  . vitamin B-12 (CYANOCOBALAMIN) 1000 MCG tablet Take 1,000 mcg by mouth daily.   Past Week at Unknown time  . Zinc 50 MG TABS Take by mouth daily.   Past Week at Unknown time  . albuterol (PROVENTIL HFA;VENTOLIN HFA) 108 (90 BASE) MCG/ACT inhaler Inhale 2 puffs into the lungs every 6 (six) hours as needed for wheezing or shortness of breath.      . sucralfate (CARAFATE) 1 g tablet Take 1 tablet (1 g total) by mouth 4 (four) times daily for 5 days. 120 tablet 0      Allergies  Allergen Reactions  . Clindamycin/Lincomycin Rash  . Codeine Rash  . Lincomycin Rash  . Morphine And Related Nausea Only  . Talwin [Pentazocine] Nausea And Vomiting  . Lisinopril Cough    cough  . Naloxone Other (See Comments)    Had issues with sedation and vomiting with EGD  . Oxycodone-Acetaminophen Other (See  Comments)    Pt states this medication makes her bowels stop moving  . Warfarin And Related Nausea Only     Past Medical History:  Diagnosis Date  . Allergy   . Anxiety   . Arthritis   . Barrett's esophagus determined by endoscopy   . Cancer [facility])    Uterine Cancer  . Chronic  kidney disease   . Chronic venous insufficiency   . Depression   . Diabetes mellitus without complication ([place])   . Difficult intubation    "not sure what happened,asleep was told had problem with intubation"  . Diverticulosis   . Fatty liver   . GERD (gastroesophageal reflux disease)   . History of bronchitis   . History of hiatal hernia   . Hyperlipidemia   . Hypertension   . Irritable bowel syndrome with constipation   . Sleep apnea    Use C- PAP  . Urinary incontinence   . Varicose veins     Review of systems:  Otherwise negative.    Physical Exam  Gen: Alert, oriented. Appears stated age.  HEENT: PERRLA. Lungs: No respiratory distress CV: RRR Abd: soft, benign, no masses Ext: No edema    Planned procedures: Proceed with EGD/colonoscopy. The patient understands the nature of the planned procedure, indications, risks, alternatives and potential complications including but not limited to bleeding, infection, perforation, damage to internal organs and possible oversedation/side effects from anesthesia. The patient agrees and gives consent to proceed.  Please refer to procedure notes for findings, recommendations and patient disposition/instructions.     Sandra Miyamoto MD, MPH Gastroenterology 12/18/2020  10:55 AM

## 2020-12-18 NOTE — Op Note (Signed)
[facility] Gastroenterology Patient Name: Sandra Hendrix Procedure Date: 12/18/2020 11:02 AM MRN: [id number] Account #: [id number] Date of Birth: 1946-05-06 Admit Type: Outpatient Age: 75 Room: [facility] ENDO ROOM 3 Gender: Female Note Status: Finalized Procedure:             Colonoscopy Indications:           Colon cancer screening in patient at increased risk:                         Family history of colorectal cancer in multiple 2nd                         degree relatives Providers:             Andrey Farmer MD, MD Referring MD:          Deborra Medina, MD (Referring MD) Medicines:             Monitored Anesthesia Care Complications:         No immediate complications. Procedure:             Pre-Anesthesia Assessment:                        - Prior to the procedure, a History and Physical was                         performed, and patient medications and allergies were                         reviewed. The patient is competent. The risks and                         benefits of the procedure and the sedation options and                         risks were discussed with the patient. All questions                         were answered and informed consent was obtained.                         Patient identification and proposed procedure were                         verified by the physician, the nurse, the anesthetist                         and the technician in the endoscopy suite. Mental                         Status Examination: alert and oriented. Airway                         Examination: normal oropharyngeal airway and neck                         mobility. Respiratory Examination: clear to  auscultation. CV Examination: normal. Prophylactic                         Antibiotics: The patient does not require prophylactic                         antibiotics. Prior Anticoagulants: The patient has                         taken no  previous anticoagulant or antiplatelet                         agents. ASA Grade Assessment: III - A patient with                         severe systemic disease. After reviewing the risks and                         benefits, the patient was deemed in satisfactory                         condition to undergo the procedure. The anesthesia                         plan was to use monitored anesthesia care (MAC).                         Immediately prior to administration of medications,                         the patient was re-assessed for adequacy to receive                         sedatives. The heart rate, respiratory rate, oxygen                         saturations, blood pressure, adequacy of pulmonary                         ventilation, and response to care were monitored                         throughout the procedure. The physical status of the                         patient was re-assessed after the procedure.                        After obtaining informed consent, the colonoscope was                         passed under direct vision. Throughout the procedure,                         the patient's blood pressure, pulse, and oxygen                         saturations were monitored continuously. The  Colonoscope was introduced through the anus and                         advanced to the the cecum, identified by appendiceal                         orifice and ileocecal valve. The colonoscopy was                         performed without difficulty. The patient tolerated                         the procedure well. The quality of the bowel                         preparation was fair. Findings:      The perianal and digital rectal examinations were normal.      Multiple small-mouthed diverticula were found in the sigmoid colon,       descending colon and ascending colon.      Internal hemorrhoids were found during retroflexion. The hemorrhoids       were  Grade I (internal hemorrhoids that do not prolapse).      The exam was otherwise without abnormality on direct and retroflexion       views. Impression:            - Preparation of the colon was fair.                        - Diverticulosis in the sigmoid colon, in the                         descending colon and in the ascending colon.                        - Internal hemorrhoids.                        - The examination was otherwise normal on direct and                         retroflexion views.                        - No specimens collected. Recommendation:        - Discharge patient to home.                        - Resume previous diet.                        - Continue present medications.                        - Repeat colonoscopy in 1 year because the bowel                         preparation was suboptimal.                        - Return to referring physician as previously  scheduled. Procedure Code(s):     --- Professional ---                        F7588, Colorectal cancer screening; colonoscopy on                         individual not meeting criteria for high risk Diagnosis Code(s):     --- Professional ---                        Z80.0, Family history of malignant neoplasm of                         digestive organs                        K64.0, First degree hemorrhoids                        K57.30, Diverticulosis of large intestine without                         perforation or abscess without bleeding CPT copyright 2019 [employer]. All rights reserved. The codes documented in this report are preliminary and upon coder review may  be revised to meet current compliance requirements. Andrey Farmer MD, MD 12/18/2020 11:44:14 AM Number of Addenda: 0 Note Initiated On: 12/18/2020 11:02 AM Scope Withdrawal Time: 0 hours 9 minutes 51 seconds  Total Procedure Duration: 0 hours 20 minutes 20 seconds  Estimated Blood Loss:   Estimated blood loss: none.      [facility]

## 2020-12-18 NOTE — Interval H&P Note (Signed)
History and Physical Interval Note:  12/18/2020 10:58 AM  Sandra Hendrix  has presented today for surgery, with the diagnosis of FAMILY HX.OF COLON CANCER.  The various methods of treatment have been discussed with the patient and family. After consideration of risks, benefits and other options for treatment, the patient has consented to  Procedure(s): COLONOSCOPY WITH PROPOFOL (N/A) ESOPHAGOGASTRODUODENOSCOPY (EGD) (N/A) as a surgical intervention.  The patient's history has been reviewed, patient examined, no change in status, stable for surgery.  I have reviewed the patient's chart and labs.  Questions were answered to the patient's satisfaction.     Lesly Rubenstein  Ok to proceed with EGD/Colonoscopy

## 2020-12-18 NOTE — Transfer of Care (Signed)
Immediate Anesthesia Transfer of Care Note  Patient: Sandra Hendrix  Procedure(s) Performed: COLONOSCOPY WITH PROPOFOL (N/A ) ESOPHAGOGASTRODUODENOSCOPY (EGD) (N/A )  Patient Location: Endoscopy Unit  Anesthesia Type:General  Level of Consciousness: drowsy  Airway & Oxygen Therapy: Patient Spontanous Breathing  Post-op Assessment: Report given to RN and Post -op Vital signs reviewed and stable  Post vital signs: Reviewed and stable  Last Vitals:  Vitals Value Taken Time  BP    Temp    Pulse 51 12/18/20 1141  Resp 18 12/18/20 1141  SpO2 94 % 12/18/20 1141  Vitals shown include unvalidated device data.  Last Pain:  Vitals:   12/18/20 1023  TempSrc: Temporal  PainSc: 0-No pain         Complications: No complications documented.

## 2020-12-18 NOTE — Anesthesia Preprocedure Evaluation (Signed)
Anesthesia Evaluation  Patient identified by MRN, date of birth, ID band Patient awake    Reviewed: Allergy & Precautions, NPO status , Patient's Chart, lab work & pertinent test results  History of Anesthesia Complications (+) DIFFICULT AIRWAY and history of anesthetic complications  Airway Mallampati: III       Dental  (+) Dental Advidsory Given   Pulmonary neg shortness of breath, sleep apnea and Continuous Positive Airway Pressure Ventilation , neg COPD, neg recent URI, Not current smoker,           Cardiovascular Exercise Tolerance: Good hypertension, Pt. on medications + angina (2 weeks ago, thought it was from GERD.  Seeing cardiologist next week just in case) + Peripheral Vascular Disease  (-) Past MI, (-) Cardiac Stents and (-) CHF (-) dysrhythmias (-) Valvular Problems/Murmurs     Neuro/Psych neg Seizures PSYCHIATRIC DISORDERS Anxiety Depression    GI/Hepatic Neg liver ROS, hiatal hernia, GERD  Medicated and Controlled,  Endo/Other  diabetes, Type 2, Oral Hypoglycemic Agents, Insulin Dependent  Renal/GU Renal InsufficiencyRenal disease     Musculoskeletal   Abdominal   Peds  Hematology   Anesthesia Other Findings Past Medical History: No date: Allergy No date: Anxiety No date: Arthritis No date: Barrett's esophagus determined by endoscopy No date: Cancer [facility])     Comment:  Uterine Cancer No date: Chronic kidney disease No date: Chronic venous insufficiency No date: Depression No date: Diabetes mellitus without complication (HCC) No date: Difficult intubation     Comment:  "not sure what happened,asleep was told had problem with              intubation" No date: Diverticulosis No date: Fatty liver No date: GERD (gastroesophageal reflux disease) No date: History of bronchitis No date: History of hiatal hernia No date: Hyperlipidemia No date: Hypertension No date: Irritable bowel syndrome with  constipation No date: Sleep apnea     Comment:  Use C- PAP No date: Urinary incontinence No date: Varicose veins   Reproductive/Obstetrics negative OB ROS                             Anesthesia Physical  Anesthesia Plan  ASA: III  Anesthesia Plan: General   Post-op Pain Management:    Induction: Intravenous  PONV Risk Score and Plan: 3 and TIVA, Propofol infusion and Treatment may vary due to age or medical condition  Airway Management Planned: Nasal Cannula and Natural Airway  Additional Equipment:   Intra-op Plan:   Post-operative Plan:   Informed Consent: I have reviewed the patients History and Physical, chart, labs and discussed the procedure including the risks, benefits and alternatives for the proposed anesthesia with the patient or authorized representative who has indicated his/her understanding and acceptance.       Plan Discussed with:   Anesthesia Plan Comments:         Anesthesia Quick Evaluation

## 2020-12-18 NOTE — Op Note (Signed)
[facility] Gastroenterology Patient Name: Sandra Hendrix Procedure Date: 12/18/2020 11:03 AM MRN: [id number] Account #: [id number] Date of Birth: [birth year] Admit Type: Outpatient Age: 75 Room: [facility] ENDO ROOM 3 Gender: Female Note Status: Finalized Procedure:             Upper GI endoscopy Indications:           Epigastric abdominal pain Providers:             Andrey Farmer MD, MD Referring MD:          Deborra Medina, MD (Referring MD) Medicines:             Monitored Anesthesia Care Complications:         No immediate complications. Estimated blood loss:                         Minimal. Procedure:             Pre-Anesthesia Assessment:                        - Prior to the procedure, a History and Physical was                         performed, and patient medications and allergies were                         reviewed. The patient is competent. The risks and                         benefits of the procedure and the sedation options and                         risks were discussed with the patient. All questions                         were answered and informed consent was obtained.                         Patient identification and proposed procedure were                         verified by the physician, the nurse, the anesthetist                         and the technician in the endoscopy suite. Mental                         Status Examination: alert and oriented. Airway                         Examination: normal oropharyngeal airway and neck                         mobility. Respiratory Examination: clear to                         auscultation. CV Examination: normal. Prophylactic  Antibiotics: The patient does not require prophylactic                         antibiotics. Prior Anticoagulants: The patient has                         taken no previous anticoagulant or antiplatelet                         agents. ASA Grade  Assessment: III - A patient with                         severe systemic disease. After reviewing the risks and                         benefits, the patient was deemed in satisfactory                         condition to undergo the procedure. The anesthesia                         plan was to use monitored anesthesia care (MAC).                         Immediately prior to administration of medications,                         the patient was re-assessed for adequacy to receive                         sedatives. The heart rate, respiratory rate, oxygen                         saturations, blood pressure, adequacy of pulmonary                         ventilation, and response to care were monitored                         throughout the procedure. The physical status of the                         patient was re-assessed after the procedure.                        After obtaining informed consent, the endoscope was                         passed under direct vision. Throughout the procedure,                         the patient's blood pressure, pulse, and oxygen                         saturations were monitored continuously. The Endoscope                         was introduced through the mouth, and advanced to the  second part of duodenum. The upper GI endoscopy was                         accomplished without difficulty. The patient tolerated                         the procedure well. Findings:      A 6 cm hiatal hernia was present.      The examined esophagus was mildly tortuous.      The exam of the esophagus was otherwise normal.      Patchy moderate inflammation characterized by erythema was found in the       gastric antrum. Biopsies were taken with a cold forceps for Helicobacter       pylori testing. Estimated blood loss was minimal.      The examined duodenum was normal. Impression:            - 6 cm hiatal hernia.                        - Tortuous  esophagus.                        - Gastritis. Biopsied.                        - Normal examined duodenum. Recommendation:        - Perform a colonoscopy today. Procedure Code(s):     --- Professional ---                        [id number], Esophagogastroduodenoscopy, flexible,                         transoral; with biopsy, single or multiple Diagnosis Code(s):     --- Professional ---                        K44.9, Diaphragmatic hernia without obstruction or                         gangrene                        Q39.9, Congenital malformation of esophagus,                         unspecified                        K29.70, Gastritis, unspecified, without bleeding                        R10.13, Epigastric pain CPT copyright 2019 American [employer] Association. All rights reserved. The codes documented in this report are preliminary and upon coder review may  be revised to meet current compliance requirements. Andrey Farmer MD, MD 12/18/2020 11:41:54 AM Number of Addenda: 0 Note Initiated On: 12/18/2020 11:03 AM Estimated Blood Loss:  Estimated blood loss was minimal.      [facility]

## 2020-12-18 NOTE — Anesthesia Postprocedure Evaluation (Signed)
Anesthesia Post Note  Patient: Jeremy P Scrivener  Procedure(s) Performed: COLONOSCOPY WITH PROPOFOL (N/A ) ESOPHAGOGASTRODUODENOSCOPY (EGD) (N/A )  Patient location during evaluation: Endoscopy Anesthesia Type: General Level of consciousness: awake and alert Pain management: pain level controlled Vital Signs Assessment: post-procedure vital signs reviewed and stable Respiratory status: spontaneous breathing, nonlabored ventilation, respiratory function stable and patient connected to nasal cannula oxygen Cardiovascular status: blood pressure returned to baseline and stable Postop Assessment: no apparent nausea or vomiting Anesthetic complications: no   No complications documented.   Last Vitals:  Vitals:   12/18/20 1152 12/18/20 1202  BP: 139/61   Pulse: 72   Resp: 18 20  Temp:    SpO2:      Last Pain:  Vitals:   12/18/20 1202  TempSrc:   PainSc: 0-No pain                 Martha Clan

## 2020-12-20 ENCOUNTER — Encounter: Payer: Self-pay | Admitting: Gastroenterology

## 2020-12-21 LAB — SURGICAL PATHOLOGY

## 2020-12-24 DIAGNOSIS — R600 Localized edema: Secondary | ICD-10-CM | POA: Diagnosis not present

## 2020-12-24 DIAGNOSIS — N1831 Chronic kidney disease, stage 3a: Secondary | ICD-10-CM | POA: Diagnosis not present

## 2020-12-24 DIAGNOSIS — I10 Essential (primary) hypertension: Secondary | ICD-10-CM | POA: Diagnosis not present

## 2020-12-24 DIAGNOSIS — E1122 Type 2 diabetes mellitus with diabetic chronic kidney disease: Secondary | ICD-10-CM | POA: Diagnosis not present

## 2020-12-24 DIAGNOSIS — R809 Proteinuria, unspecified: Secondary | ICD-10-CM | POA: Diagnosis not present

## 2020-12-29 DIAGNOSIS — E785 Hyperlipidemia, unspecified: Secondary | ICD-10-CM | POA: Diagnosis not present

## 2020-12-29 DIAGNOSIS — I10 Essential (primary) hypertension: Secondary | ICD-10-CM | POA: Diagnosis not present

## 2020-12-29 DIAGNOSIS — E119 Type 2 diabetes mellitus without complications: Secondary | ICD-10-CM | POA: Diagnosis not present

## 2020-12-29 DIAGNOSIS — R079 Chest pain, unspecified: Secondary | ICD-10-CM | POA: Diagnosis not present

## 2020-12-31 ENCOUNTER — Other Ambulatory Visit: Payer: Self-pay | Admitting: Internal Medicine

## 2020-12-31 ENCOUNTER — Other Ambulatory Visit: Payer: Self-pay

## 2020-12-31 ENCOUNTER — Ambulatory Visit: Payer: Medicare PPO | Admitting: Internal Medicine

## 2020-12-31 ENCOUNTER — Encounter: Payer: Self-pay | Admitting: Internal Medicine

## 2020-12-31 VITALS — BP 120/72 | HR 67 | Temp 98.0°F | Resp 14 | Ht 61.0 in | Wt 166.4 lb

## 2020-12-31 DIAGNOSIS — E1121 Type 2 diabetes mellitus with diabetic nephropathy: Secondary | ICD-10-CM | POA: Diagnosis not present

## 2020-12-31 DIAGNOSIS — R0789 Other chest pain: Secondary | ICD-10-CM

## 2020-12-31 DIAGNOSIS — N1831 Chronic kidney disease, stage 3a: Secondary | ICD-10-CM

## 2020-12-31 DIAGNOSIS — K760 Fatty (change of) liver, not elsewhere classified: Secondary | ICD-10-CM

## 2020-12-31 DIAGNOSIS — Z794 Long term (current) use of insulin: Secondary | ICD-10-CM | POA: Diagnosis not present

## 2020-12-31 DIAGNOSIS — E669 Obesity, unspecified: Secondary | ICD-10-CM | POA: Diagnosis not present

## 2020-12-31 MED ORDER — INSULIN DETEMIR 100 UNIT/ML ~~LOC~~ SOLN: SUBCUTANEOUS | 2 refills | Status: DC

## 2020-12-31 MED ORDER — OZEMPIC (0.25 OR 0.5 MG/DOSE) 2 MG/1.5ML ~~LOC~~ SOPN: 0.2500 mg | PEN_INJECTOR | SUBCUTANEOUS | 5 refills | Status: DC

## 2020-12-31 NOTE — Progress Notes (Signed)
Subjective:  Patient ID: Sandra Hendrix, female    DOB: 1946/08/22  Age: 75 y.o. MRN: [id number]  CC: The primary encounter diagnosis was Type 2 diabetes mellitus with diabetic nephropathy, with long-term current use of insulin (Sandra Hendrix). Diagnoses of Stage 3a chronic kidney disease ([place]), Hepatic steatosis, Obesity (BMI 30.0-34.9), and Atypical chest pain were also pertinent to this visit.  [name] presents for FOLLOW UP ON TYPE 2 DN, CKD AND OTHER ISSUES.   This visit occurred during the SARS-CoV-2 public health emergency.  Safety protocols were in place, including screening questions prior to the visit, additional usage of staff PPE, and extensive cleaning of exam room while observing appropriate contact time as indicated for disinfecting solutions.   Sandra Hendrix is a 75 yr old female with type 2 DM, CKD, and hypertension who presents for follow up,  Since her last visit she has seen nephrology and had an initial evaluation for atytpical chest pain.     Dr Sandra Hendrix has ordered a  stress test which is scheduled for June.   CKD:    Type 2 DM with obesity and CKD:  She is currently taking metformin and Januvia, and Levemir 18 units daily at 10 am.  Diet and lifestyle reviewed. Usually skips breakfast. Exercises first,  Then eats first meal at 12  Outpatient Medications Prior to Visit  Medication Sig Dispense Refill  . acetaminophen (TYLENOL) 500 MG tablet Take 2 tablets (1,000 mg total) by mouth every 6 (six) hours. (Patient taking differently: Take 1,000 mg by mouth every 6 (six) hours as needed.) 65 tablet 1  . albuterol (PROVENTIL HFA;VENTOLIN HFA) 108 (90 BASE) MCG/ACT inhaler Inhale 2 puffs into the lungs every 6 (six) hours as needed for wheezing or shortness of breath.     . Ascorbic Acid 500 MG CHEW Chew by mouth.    Marland Kitchen aspirin EC 81 MG tablet Take 81 mg by mouth every evening.     Marland Kitchen atorvastatin (LIPITOR) 40 MG tablet Take 0.5 tablets (20 mg total) by mouth daily at 6  PM. 90 tablet 3  . azelastine (ASTELIN) 0.1 % nasal spray Place 1 spray into both nostrils daily.    . benzonatate (TESSALON) 100 MG capsule Take 1 capsule (100 mg total) by mouth 3 (three) times daily as needed for cough. 21 capsule 0  . Biotin 5000 MCG TABS Take by mouth 2 (two) times daily.     . blood glucose meter kit and supplies Use to check blood sugar twice daily. ICD 10: E11.29 1 each 0  . Cetirizine HCl 10 MG CAPS Take by mouth.    Verneita Griffes Bark POWD Take by mouth.    . COLLAGEN PO Take by mouth 2 (two) times daily. For improved fingernail strength    . docusate sodium (COLACE) 100 MG capsule Take 1 capsule (100 mg total) by mouth 2 (two) times daily. 20 capsule 0  . fluticasone (FLONASE) 50 MCG/ACT nasal spray Place 1 spray into both nostrils daily. 48 g 1  . furosemide (LASIX) 20 MG tablet Take 1 tablet (20 mg total) by mouth every other day. 45 tablet 3  . JANUVIA 100 MG tablet TAKE 1 TABLET BY MOUTH  DAILY 90 tablet 3  . Lancet Devices (ACCU-CHEK SOFTCLIX) lancets Use to test blood sugars 1 -2 times daily 1 each 3  . loratadine (CLARITIN) 10 MG tablet Take 10 mg by mouth daily.    Marland Kitchen losartan-hydrochlorothiazide (HYZAAR) 50-12.5 MG tablet TAKE 1  TABLET BY MOUTH  DAILY 90 tablet 3  . Magnesium 250 MG TABS Take 1 tablet by mouth daily.    . meclizine (ANTIVERT) 25 MG tablet Take 1 tablet (25 mg total) by mouth 3 (three) times daily as needed for dizziness. 30 tablet 3  . metFORMIN (GLUCOPHAGE) 500 MG tablet Take 1 tablet (500 mg total) by mouth 2 (two) times daily with a meal. 180 tablet 2  . Misc Natural Products (NEURIVA PO) Take by mouth in the morning and at bedtime.    . Multiple Vitamin (MULTIVITAMIN) tablet Take 1 tablet by mouth daily.    . nitroGLYCERIN (NITROSTAT) 0.4 MG SL tablet Place 1 tablet (0.4 mg total) under the tongue every 5 (five) minutes as needed for chest pain. 50 tablet 3  . omega-3 acid ethyl esters (LOVAZA) 1 g capsule Take 1 g by mouth daily.    Marland Kitchen  oxybutynin (DITROPAN-XL) 10 MG 24 hr tablet Take 1 tablet (10 mg total) by mouth daily. 90 tablet 3  . pantoprazole (PROTONIX) 40 MG tablet Take 1 tablet by mouth twice daily 180 tablet 1  . Probiotic Product (PROBIOTIC-10) CAPS Take 1 capsule by mouth daily.     . sertraline (ZOLOFT) 50 MG tablet Take 1 tablet (50 mg total) by mouth daily. 90 tablet 3  . tiZANidine (ZANAFLEX) 4 MG tablet tizanidine 4 mg tablet    . verapamil (VERELAN PM) 180 MG 24 hr capsule Take 1 capsule (180 mg total) by mouth daily. 90 capsule 3  . vitamin B-12 (CYANOCOBALAMIN) 1000 MCG tablet Take 1,000 mcg by mouth daily.    . Zinc 50 MG TABS Take by mouth daily.    Marland Kitchen ACCU-CHEK GUIDE test strip USE 1 STRIP TWICE DAILY 50 each 0  . insulin detemir (LEVEMIR) 100 UNIT/ML injection INJECT 20 UNITS SUBCUTANEOUSLY AT BEDTIME 10 mL 2  . potassium chloride SA (KLOR-CON) 20 MEQ tablet TAKE 1 TABLET BY MOUTH EVERY OTHER DAY 45 tablet 0  . sucralfate (CARAFATE) 1 g tablet Take 1 tablet (1 g total) by mouth 4 (four) times daily for 5 days. 120 tablet 0  . fish oil-omega-3 fatty acids 1000 MG capsule Take 1 g by mouth every evening.  (Patient not taking: Reported on 12/31/2020)    . OMEGA-3 FATTY ACIDS-VITAMIN E PO Take by mouth. (Patient not taking: Reported on 12/31/2020)     No facility-administered medications prior to visit.    Review of Systems;  Patient denies headache, fevers, malaise, unintentional weight loss, skin rash, eye pain, sinus congestion and sinus pain, sore throat, dysphagia,  hemoptysis , cough, dyspnea, wheezing, chest pain, palpitations, orthopnea, edema, abdominal pain, nausea, melena, diarrhea, constipation, flank pain, dysuria, hematuria, urinary  Frequency, nocturia, numbness, tingling, seizures,  Focal weakness, Loss of consciousness,  Tremor, insomnia, depression, anxiety, and suicidal ideation.      Objective:  BP 120/72 (BP Location: Left Arm, Patient Position: Sitting, Cuff Size: Normal)   Pulse 67    Temp 98 F (36.7 C) (Oral)   Resp 14   Ht 5' 1" (1.549 m)   Wt 166 lb 6.4 oz (75.5 kg)   SpO2 95%   BMI 31.44 kg/m   BP Readings from Last 3 Encounters:  12/31/20 120/72  12/18/20 139/61  12/14/20 118/76    Wt Readings from Last 3 Encounters:  12/31/20 166 lb 6.4 oz (75.5 kg)  12/18/20 167 lb (75.8 kg)  12/14/20 169 lb (76.7 kg)    General appearance: alert, cooperative and appears  stated age Ears: normal TM's and external ear canals both ears Throat: lips, mucosa, and tongue normal; teeth and gums normal Neck: no adenopathy, no carotid bruit, supple, symmetrical, trachea midline and thyroid not enlarged, symmetric, no tenderness/mass/nodules Back: symmetric, no curvature. ROM normal. No CVA tenderness. Lungs: clear to auscultation bilaterally Heart: regular rate and rhythm, S1, S2 normal, no murmur, click, rub or gallop Abdomen: soft, non-tender; bowel sounds normal; no masses,  no organomegaly Pulses: 2+ and symmetric Skin: Skin color, texture, turgor normal. No rashes or lesions Lymph nodes: Cervical, supraclavicular, and axillary nodes normal.  Lab Results  Component Value Date   HGBA1C 7.6 (H) 12/10/2020   HGBA1C 8.0 (H) 09/09/2020   HGBA1C 7.3 (H) 05/15/2020    Lab Results  Component Value Date   CREATININE 0.90 12/10/2020   CREATININE 0.97 12/08/2020   CREATININE 0.93 09/09/2020    Lab Results  Component Value Date   WBC 4.8 12/08/2020   HGB 12.0 12/08/2020   HCT 36.2 12/08/2020   PLT 270 12/08/2020   GLUCOSE 114 (H) 12/10/2020   CHOL 170 12/10/2020   TRIG 70.0 12/10/2020   HDL 80.10 12/10/2020   LDLDIRECT 80.0 02/22/2017   LDLCALC 76 12/10/2020   ALT 14 12/10/2020   AST 16 12/10/2020   NA 142 12/10/2020   K 3.4 (L) 12/10/2020   CL 103 12/10/2020   CREATININE 0.90 12/10/2020   BUN 24 (H) 12/10/2020   CO2 27 12/10/2020   TSH 2.95 12/10/2020   INR 1.1 01/23/2009   HGBA1C 7.6 (H) 12/10/2020   MICROALBUR <0.7 01/14/2020    No results  found.  Assessment & Plan:   Problem List Items Addressed This Visit      Unprioritized   Atypical chest pain    She has been referred to Dr Sandra Hendrix,  And a stres test is planned in June .  She has not had any symptoms since starting a PPI       CKD (chronic kidney disease) stage 3, GFR 30-59 ml/min ([facility])    Stable for over 4 years .  Monitored by Nephrology.. She is avoiding nephrotoxic agents   Lab Results  Component Value Date   CREATININE 0.90 12/10/2020   Lab Results  Component Value Date   NA 142 12/10/2020   K 3.4 (L) 12/10/2020   CL 103 12/10/2020   CO2 27 12/10/2020         DM (diabetes mellitus), type 2 with renal complications ([facility]) - Primary (Chronic)   Relevant Medications   Semaglutide,0.25 or 0.5MG/DOS, (OZEMPIC, 0.25 OR 0.5 MG/DOSE,) 2 MG/1.5ML SOPN   insulin detemir (LEVEMIR) 100 UNIT/ML injection   Other Relevant Orders   Hemoglobin A1c   Comprehensive metabolic panel   Lipid panel   Hepatic steatosis    Presumed by ultrasound changes and serologies negative for autoimmune causes of hepatitis. liver enzymes have been  normal and all modifiable risk factors including obesity, diabetes and hyperlipidemia have been addressed . Continue metrformin and atorvastatin.  Weight loss needed;  starting Ozempic   Lab Results  Component Value Date   ALT 14 12/10/2020   AST 16 12/10/2020   ALKPHOS 116 12/10/2020   BILITOT 0.5 12/10/2020          Obesity (BMI 30.0-34.9)    With  Type 2 DM and hepatic steatosis.  Adding ozempic for appetite suppression and weight loss.          I have discontinued Sandra Hendrix's fish oil-omega-3 fatty acids,  potassium chloride SA, and OMEGA-3 FATTY ACIDS-VITAMIN E PO. I have also changed her insulin detemir. Additionally, I am having her start on Ozempic (0.25 or 0.5 MG/DOSE). Lastly, I am having her maintain her multivitamin, albuterol, aspirin EC, Probiotic-10, loratadine, azelastine, Biotin, acetaminophen,  docusate sodium, accu-chek softclix, vitamin B-12, Cinnamon Bark, Magnesium, COLLAGEN PO, fluticasone, meclizine, nitroGLYCERIN, Zinc, tiZANidine, Cetirizine HCl, Ascorbic Acid, blood glucose meter kit and supplies, verapamil, sertraline, furosemide, atorvastatin, metFORMIN, oxybutynin, benzonatate, Misc Natural Products (NEURIVA PO), losartan-hydrochlorothiazide, Januvia, pantoprazole, sucralfate, and omega-3 acid ethyl esters.  Meds ordered this encounter  Medications  . Semaglutide,0.25 or 0.5MG/DOS, (OZEMPIC, 0.25 OR 0.5 MG/DOSE,) 2 MG/1.5ML SOPN    Sig: Inject 0.25 mg into the skin once a week.    Dispense:  1.5 mL    Refill:  5  . insulin detemir (LEVEMIR) 100 UNIT/ML injection    Sig: INJECT 18 UNITS SUBCUTANEOUSLY AT BEDTIME    Dispense:  10 mL    Refill:  2    KEEP ON FILE FOR FUTURE REFILLS    I provided  30 minutes of  face-to-face time during this encounter reviewing patient's current problems and past surgeries, labs and imaging studies, providing counseling on the above mentioned problems , and coordination  of care .   Medications Discontinued During This Encounter  Medication Reason  . insulin detemir (LEVEMIR) 100 UNIT/ML injection   . potassium chloride SA (KLOR-CON) 20 MEQ tablet   . OMEGA-3 FATTY ACIDS-VITAMIN E PO   . fish oil-omega-3 fatty acids 1000 MG capsule     Follow-up: No follow-ups on file.   Crecencio Mc, MD

## 2020-12-31 NOTE — Patient Instructions (Signed)
We are starting you on ozempic   To help you lose weight and manage your diabetes.  The starting dose is 0.25 mg  Once a week  Bring the pen with you to a nurse visit next week so se can be sure you are comfortable using it  Your worst BS readings are early morning and late at night so we should continue targeting them.

## 2021-01-03 NOTE — Assessment & Plan Note (Signed)
With  Type 2 DM and hepatic steatosis.  Adding ozempic for appetite suppression and weight loss.

## 2021-01-03 NOTE — Assessment & Plan Note (Addendum)
Presumed by ultrasound changes and serologies negative for autoimmune causes of hepatitis. liver enzymes have been  normal and all modifiable risk factors including obesity, diabetes and hyperlipidemia have been addressed . Continue metrformin and atorvastatin.  Weight loss needed;  starting Ozempic   Lab Results  Component Value Date   ALT 14 12/10/2020   AST 16 12/10/2020   ALKPHOS 116 12/10/2020   BILITOT 0.5 12/10/2020

## 2021-01-03 NOTE — Assessment & Plan Note (Signed)
Stable for over 4 years .  Monitored by Nephrology.. She is avoiding nephrotoxic agents   Lab Results  Component Value Date   CREATININE 0.90 12/10/2020   Lab Results  Component Value Date   NA 142 12/10/2020   K 3.4 (L) 12/10/2020   CL 103 12/10/2020   CO2 27 12/10/2020

## 2021-01-03 NOTE — Assessment & Plan Note (Signed)
She has been referred to Dr Saralyn Pilar,  And a stres test is planned in June .  She has not had any symptoms since starting a PPI

## 2021-01-07 ENCOUNTER — Encounter: Payer: Medicare PPO | Admitting: Obstetrics and Gynecology

## 2021-01-12 ENCOUNTER — Other Ambulatory Visit: Payer: Self-pay

## 2021-01-12 ENCOUNTER — Ambulatory Visit (INDEPENDENT_AMBULATORY_CARE_PROVIDER_SITE_OTHER): Payer: Medicare PPO

## 2021-01-12 DIAGNOSIS — Z794 Long term (current) use of insulin: Secondary | ICD-10-CM | POA: Diagnosis not present

## 2021-01-12 DIAGNOSIS — E1121 Type 2 diabetes mellitus with diabetic nephropathy: Secondary | ICD-10-CM | POA: Diagnosis not present

## 2021-01-12 NOTE — Progress Notes (Signed)
Patient Education Learner: patient Educated on: How to administer Ozempic.  Readiness: acceptance Method: demonstration Response: demonstrated understanding  Sandra Hendrix,cma

## 2021-01-14 ENCOUNTER — Ambulatory Visit: Payer: Medicare PPO | Admitting: Internal Medicine

## 2021-01-19 DIAGNOSIS — J209 Acute bronchitis, unspecified: Secondary | ICD-10-CM | POA: Diagnosis not present

## 2021-01-19 DIAGNOSIS — J45991 Cough variant asthma: Secondary | ICD-10-CM | POA: Diagnosis not present

## 2021-01-19 DIAGNOSIS — J300 Vasomotor rhinitis: Secondary | ICD-10-CM | POA: Diagnosis not present

## 2021-01-19 DIAGNOSIS — R053 Chronic cough: Secondary | ICD-10-CM | POA: Diagnosis not present

## 2021-01-20 ENCOUNTER — Other Ambulatory Visit: Payer: Self-pay | Admitting: Internal Medicine

## 2021-02-08 ENCOUNTER — Ambulatory Visit (INDEPENDENT_AMBULATORY_CARE_PROVIDER_SITE_OTHER): Payer: Medicare PPO | Admitting: Vascular Surgery

## 2021-02-08 ENCOUNTER — Ambulatory Visit (INDEPENDENT_AMBULATORY_CARE_PROVIDER_SITE_OTHER): Payer: Medicare PPO

## 2021-02-08 ENCOUNTER — Other Ambulatory Visit: Payer: Self-pay

## 2021-02-08 ENCOUNTER — Encounter (INDEPENDENT_AMBULATORY_CARE_PROVIDER_SITE_OTHER): Payer: Self-pay | Admitting: Vascular Surgery

## 2021-02-08 VITALS — BP 97/65 | HR 78 | Ht 61.0 in | Wt 165.0 lb

## 2021-02-08 DIAGNOSIS — I872 Venous insufficiency (chronic) (peripheral): Secondary | ICD-10-CM | POA: Diagnosis not present

## 2021-02-08 DIAGNOSIS — E1121 Type 2 diabetes mellitus with diabetic nephropathy: Secondary | ICD-10-CM | POA: Diagnosis not present

## 2021-02-08 DIAGNOSIS — E785 Hyperlipidemia, unspecified: Secondary | ICD-10-CM | POA: Diagnosis not present

## 2021-02-08 DIAGNOSIS — I701 Atherosclerosis of renal artery: Secondary | ICD-10-CM | POA: Diagnosis not present

## 2021-02-08 DIAGNOSIS — I10 Essential (primary) hypertension: Secondary | ICD-10-CM

## 2021-02-08 DIAGNOSIS — Z794 Long term (current) use of insulin: Secondary | ICD-10-CM

## 2021-02-08 NOTE — Progress Notes (Signed)
MRN : [id number]  Sandra Hendrix is a 75 y.o. ([birth year]) female who presents with chief complaint of  Chief Complaint  Patient presents with  . Follow-up    6 mo  renal    .  History of Present Illness:   The patient has a long history of hypertension.   The patient does have family history of hypertension.   There is no prior documented abdominal bruit. The patient occasionally has flushing symptoms but denies palpitations. No episodes of syncope.There is no history of headache. There is no history of flash pulmonary edema.  The patient denies a history of renal disease.  The patient denies amaurosis fugax or recent TIA symptoms. There are no recent neurological changes noted. The patient denies claudication symptoms or rest pain symptoms. The patient denies history of DVT, PE or superficial thrombophlebitis. The patient denies recent episodes of angina or shortness of breath.   Duplex ultrasound of the renal arteries shows 40-59% right renal artery stenosis and <40% left renal artery stenosis.   Current Meds  Medication Sig  . ACCU-CHEK GUIDE test strip USE 1 STRIP TO CHECK GLUCOSE TWICE DAILY  . acetaminophen (TYLENOL) 500 MG tablet Take 2 tablets (1,000 mg total) by mouth every 6 (six) hours. (Patient taking differently: Take 1,000 mg by mouth every 6 (six) hours as needed.)  . albuterol (PROVENTIL HFA;VENTOLIN HFA) 108 (90 BASE) MCG/ACT inhaler Inhale 2 puffs into the lungs every 6 (six) hours as needed for wheezing or shortness of breath.   . Ascorbic Acid 500 MG CHEW Chew by mouth.  Marland Kitchen aspirin EC 81 MG tablet Take 81 mg by mouth every evening.   Marland Kitchen atorvastatin (LIPITOR) 40 MG tablet Take 0.5 tablets (20 mg total) by mouth daily at 6 PM.  . azelastine (ASTELIN) 0.1 % nasal spray Place 1 spray into both nostrils daily.  . benzonatate (TESSALON) 100 MG capsule Take 1 capsule (100 mg total) by mouth 3 (three) times daily as needed for cough.  . Biotin 5000 MCG  TABS Take by mouth 2 (two) times daily.   . blood glucose meter kit and supplies Use to check blood sugar twice daily. ICD 10: E11.29  . Cetirizine HCl 10 MG CAPS Take by mouth.  Verneita Griffes Bark POWD Take by mouth.  . COLLAGEN PO Take by mouth 2 (two) times daily. For improved fingernail strength  . docusate sodium (COLACE) 100 MG capsule Take 1 capsule (100 mg total) by mouth 2 (two) times daily.  . fluticasone (FLONASE) 50 MCG/ACT nasal spray Place 1 spray into both nostrils daily.  . fluticasone (FLOVENT DISKUS) 50 MCG/BLIST diskus inhaler Inhale 1 puff into the lungs 2 (two) times daily.  . furosemide (LASIX) 20 MG tablet Take 1 tablet (20 mg total) by mouth every other day.  . insulin detemir (LEVEMIR) 100 UNIT/ML injection INJECT 18 UNITS SUBCUTANEOUSLY AT BEDTIME  . JANUVIA 100 MG tablet TAKE 1 TABLET BY MOUTH  DAILY  . Lancet Devices (ACCU-CHEK SOFTCLIX) lancets Use to test blood sugars 1 -2 times daily  . loratadine (CLARITIN) 10 MG tablet Take 10 mg by mouth daily.  Marland Kitchen losartan-hydrochlorothiazide (HYZAAR) 50-12.5 MG tablet TAKE 1 TABLET BY MOUTH  DAILY  . Magnesium 250 MG TABS Take 1 tablet by mouth daily.  . meclizine (ANTIVERT) 25 MG tablet Take 1 tablet (25 mg total) by mouth 3 (three) times daily as needed for dizziness.  . metFORMIN (GLUCOPHAGE) 500 MG tablet Take 1 tablet (500 mg total)  by mouth 2 (two) times daily with a meal.  . Misc Natural Products (NEURIVA PO) Take by mouth in the morning and at bedtime.  . Multiple Vitamin (MULTIVITAMIN) tablet Take 1 tablet by mouth daily.  . nitroGLYCERIN (NITROSTAT) 0.4 MG SL tablet Place 1 tablet (0.4 mg total) under the tongue every 5 (five) minutes as needed for chest pain.  Marland Kitchen omega-3 acid ethyl esters (LOVAZA) 1 g capsule Take 1 g by mouth daily.  Marland Kitchen oxybutynin (DITROPAN-XL) 10 MG 24 hr tablet Take 1 tablet (10 mg total) by mouth daily.  . pantoprazole (PROTONIX) 40 MG tablet Take 1 tablet by mouth twice daily  . Probiotic Product  (PROBIOTIC-10) CAPS Take 1 capsule by mouth daily.   . Semaglutide,0.25 or 0.5MG/DOS, (OZEMPIC, 0.25 OR 0.5 MG/DOSE,) 2 MG/1.5ML SOPN Inject 0.25 mg into the skin once a week.  . sertraline (ZOLOFT) 50 MG tablet Take 1 tablet (50 mg total) by mouth daily.  Marland Kitchen tiZANidine (ZANAFLEX) 4 MG tablet tizanidine 4 mg tablet  . verapamil (VERELAN PM) 180 MG 24 hr capsule Take 1 capsule (180 mg total) by mouth daily.  . vitamin B-12 (CYANOCOBALAMIN) 1000 MCG tablet Take 1,000 mcg by mouth daily.  . Zinc 50 MG TABS Take by mouth daily.    Past Medical History:  Diagnosis Date  . Allergy   . Anxiety   . Arthritis   . Barrett's esophagus determined by endoscopy   . Cancer [facility])    Uterine Cancer  . Chronic kidney disease   . Chronic venous insufficiency   . Depression   . Diabetes mellitus without complication ([place])   . Difficult intubation    "not sure what happened,asleep was told had problem with intubation"  . Diverticulosis   . Fatty liver   . GERD (gastroesophageal reflux disease)   . History of bronchitis   . History of hiatal hernia   . Hyperlipidemia   . Hypertension   . Irritable bowel syndrome with constipation   . Sleep apnea    Use C- PAP  . Urinary incontinence   . Varicose veins     Past Surgical History:  Procedure Laterality Date  . CARDIAC CATHETERIZATION    . CHOLECYSTECTOMY    . COLONOSCOPY WITH PROPOFOL N/A 06/14/2016   Procedure: COLONOSCOPY WITH PROPOFOL;  Surgeon: Lollie Sails, MD;  Location[facility] ENDOSCOPY;  Service: Endoscopy;  Laterality: N/A;  . COLONOSCOPY WITH PROPOFOL N/A 12/18/2020   Procedure: COLONOSCOPY WITH PROPOFOL;  Surgeon: Lesly Rubenstein, MD;  Location: [facility] ENDOSCOPY;  Service: Endoscopy;  Laterality: N/A;  . ESOPHAGOGASTRODUODENOSCOPY N/A 12/18/2020   Procedure: ESOPHAGOGASTRODUODENOSCOPY (EGD);  Surgeon: Lesly Rubenstein, MD;  Location: [facility] ENDOSCOPY;  Service: Endoscopy;  Laterality: N/A;  . ESOPHAGOGASTRODUODENOSCOPY (EGD) WITH  PROPOFOL N/A 06/14/2016   Procedure: ESOPHAGOGASTRODUODENOSCOPY (EGD) WITH PROPOFOL;  Surgeon: Lollie Sails, MD;  Location: [facility] ENDOSCOPY;  Service: Endoscopy;  Laterality: N/A;  . ESOPHAGOGASTRODUODENOSCOPY (EGD) WITH PROPOFOL N/A 06/12/2017   Procedure: ESOPHAGOGASTRODUODENOSCOPY (EGD) WITH PROPOFOL;  Surgeon: Lollie Sails, MD;  Location: [facility] ENDOSCOPY;  Service: Endoscopy;  Laterality: N/A;  . ESOPHAGOGASTRODUODENOSCOPY (EGD) WITH PROPOFOL N/A 08/25/2017   Procedure: ESOPHAGOGASTRODUODENOSCOPY (EGD) WITH PROPOFOL;  Surgeon: Lollie Sails, MD;  Location: [facility] ENDOSCOPY;  Service: Endoscopy;  Laterality: N/A;  . HYSTEROSCOPY WITH D & C N/A 01/02/2017   Procedure: DILATATION AND CURETTAGE /HYSTEROSCOPY;  Surgeon: Brayton Mars, MD;  Location: [facility] ORS;  Service: Gynecology;  Laterality: N/A;  . JOINT REPLACEMENT Right 2007  Total Knee Replacement  . JOINT REPLACEMENT Left 2004   Total Knee Replacement  . LAPAROSCOPIC HYSTERECTOMY Bilateral 03/01/2017   Procedure: HYSTERECTOMY TOTAL LAPAROSCOPIC BSO;  Surgeon: Mellody Drown, MD;  Location: [facility] ORS;  Service: Gynecology;  Laterality: Bilateral;  . ROTATOR CUFF REPAIR Right   . SENTINEL NODE BIOPSY N/A 03/01/2017   Procedure: SENTINEL NODE INJECTION AND BIOPSY;  Surgeon: Mellody Drown, MD;  Location: [facility] ORS;  Service: Gynecology;  Laterality: N/A;  . TONSILLECTOMY     as a child  . TUBAL LIGATION    . UPPER GI ENDOSCOPY     x2    Social History Social History   Tobacco Use  . Smoking status: Never Smoker  . Smokeless tobacco: Never Used  Vaping Use  . Vaping Use: Never used  Substance Use Topics  . Alcohol use: Not Currently    Comment: rare  . Drug use: No    Family History Family History  Problem Relation Age of Onset  . Stroke Mother   . Hypertension Mother   . Lupus Mother   . Alcohol abuse Father   . Diabetes Father   . Cancer Father        gallbladder ca into liver   . Arthritis Sister    . Diabetes Sister   . Lumbar disc disease Sister   . Hyperlipidemia Daughter   . Hypertension Daughter   . Cancer Maternal Aunt        ovarian ca  . Diabetes Paternal Aunt   . Drug abuse Paternal Aunt   . Cancer Maternal Grandfather        colon ca  . Hypertension Brother   . Alcohol abuse Brother   . Stroke Brother     Allergies  Allergen Reactions  . Clindamycin/Lincomycin Rash  . Codeine Rash  . Lincomycin Rash  . Morphine And Related Nausea Only  . Talwin [Pentazocine] Nausea And Vomiting  . Lisinopril Cough    cough  . Naloxone Other (See Comments)    Had issues with sedation and vomiting with EGD  . Oxycodone-Acetaminophen Other (See Comments)    Pt states this medication makes her bowels stop moving  . Warfarin And Related Nausea Only     REVIEW OF SYSTEMS (Negative unless checked)  Constitutional: '[]' Weight loss  '[]' Fever  '[]' Chills Cardiac: '[]' Chest pain   '[]' Chest pressure   '[]' Palpitations   '[]' Shortness of breath when laying flat   '[]' Shortness of breath with exertion. Vascular:  '[]' Pain in legs with walking   '[]' Pain in legs at rest  '[]' History of DVT   '[]' Phlebitis   '[]' Swelling in legs   '[]' Varicose veins   '[]' Non-healing ulcers Pulmonary:   '[]' Uses home oxygen   '[]' Productive cough   '[]' Hemoptysis   '[]' Wheeze  '[]' COPD   '[]' Asthma Neurologic:  '[]' Dizziness   '[]' Seizures   '[]' History of stroke   '[]' History of TIA  '[]' Aphasia   '[]' Vissual changes   '[]' Weakness or numbness in arm   '[]' Weakness or numbness in leg Musculoskeletal:   '[]' Joint swelling   '[]' Joint pain   '[]' Low back pain Hematologic:  '[]' Easy bruising  '[]' Easy bleeding   '[]' Hypercoagulable state   '[]' Anemic Gastrointestinal:  '[]' Diarrhea   '[]' Vomiting  '[]' Gastroesophageal reflux/heartburn   '[]' Difficulty swallowing. Genitourinary:  '[]' Chronic kidney disease   '[]' Difficult urination  '[]' Frequent urination   '[]' Blood in urine Skin:  '[]' Rashes   '[]' Ulcers  Psychological:  '[]' History of anxiety   '[]'  History of major depression.  Physical  Examination  Vitals:   02/08/21  1025  BP: 97/65  Pulse: 78  Weight: 165 lb (74.8 kg)  Height: '5\' 1"'  (1.549 m)   Body mass index is 31.18 kg/m. Gen: WD/WN, NAD Head: [place]/AT, No temporalis wasting.  Ear/Nose/Throat: Hearing grossly intact, nares w/o erythema or drainage Eyes: PER, EOMI, sclera nonicteric.  Neck: Supple, no large masses.   Pulmonary:  Good air movement, no audible wheezing bilaterally, no use of accessory muscles.  Cardiac: RRR, no JVD Vascular: scattered varicosities present bilaterally.  Mild venous stasis changes to the legs bilaterally.  2+ soft pitting edema Vessel Right Left  Radial Palpable Palpable  Gastrointestinal: Non-distended. No guarding/no peritoneal signs.  Musculoskeletal: M/S 5/5 throughout.  No deformity or atrophy.  Neurologic: CN 2-12 intact. Symmetrical.  Speech is fluent. Motor exam as listed above. Psychiatric: Judgment intact, Mood & affect appropriate for pt's clinical situation. Dermatologic: Venous rashes no ulcers noted.  No changes consistent with cellulitis.   CBC Lab Results  Component Value Date   WBC 4.8 12/08/2020   HGB 12.0 12/08/2020   HCT 36.2 12/08/2020   MCV 89.4 12/08/2020   PLT 270 12/08/2020    BMET    Component Value Date/Time   NA 142 12/10/2020 0818   NA 142 11/18/2014 0000   K 3.4 (L) 12/10/2020 0818   CL 103 12/10/2020 0818   CO2 27 12/10/2020 0818   GLUCOSE 114 (H) 12/10/2020 0818   BUN 24 (H) 12/10/2020 0818   BUN 21 11/29/2016 0000   CREATININE 0.90 12/10/2020 0818   CALCIUM 9.2 12/10/2020 0818   GFRNONAA >60 12/08/2020 1525   GFRAA 60 (L) 02/27/2017 1117   CrCl cannot be calculated (Patient's most recent lab result is older than the maximum 21 days allowed.).  COAG Lab Results  Component Value Date   INR 1.1 01/23/2009    Radiology No results found.   Assessment/Plan 1. Atherosclerosis of renal artery ([place]) Given patient's arterial disease optimal control of the patient's hypertension  is important. BP is acceptable today and at the present time she is on only 3 medications at fairly typical doses.  The patient's vital signs and noninvasive studies support the renal artery stenosis is not significantly.  Duplex ultrasound of the renal arteries demonstrates mild elevation in the velocities of the right renal artery. High resistance is noted in the cortex in the right kidney is 2 cm smaller than the left. These findings are more consistent with intrinsic renal disease rather than renal artery stenosis. Left kidney appears normal without evidence of hemodynamically significant stenosis.  No invasive studies or intervention is indicated at this time.  The patient will continue the current antihypertensive medications, no changes at this time.  The primary medical service will continue aggressive antihypertensive therapy as per the AHA guidelines  - VAS US RENAL ARTERY DUPLEX; Future  2. Chronic venous insufficiency No surgery or intervention at this point in time.    I have had a long discussion with the patient regarding venous insufficiency and why it  causes symptoms. I have discussed with the patient the chronic skin changes that accompany venous insufficiency and the long term sequela such as infection and ulceration.  Patient will begin wearing graduated compression stockings class 1 (20-30 mmHg) or compression wraps on a daily basis a prescription was given. The patient will put the stockings on first thing in the morning and removing them in the evening. The patient is instructed specifically not to sleep in the stockings.    In addition, behavioral modification  including several periods of elevation of the lower extremities during the day will be continued. I have demonstrated that proper elevation is a position with the ankles at heart level.  3. Essential hypertension Continue antihypertensive medications as already ordered, these medications have been reviewed and  there are no changes at this time.   4. Type 2 diabetes mellitus with diabetic nephropathy, with long-term current use of insulin ([place]) Continue hypoglycemic medications as already ordered, these medications have been reviewed and there are no changes at this time.  Hgb A1C to be monitored as already arranged by primary service   5. Hyperlipidemia LDL goal <100 Continue statin as ordered and reviewed, no changes at this time     Hortencia Pilar, MD  02/08/2021 12:28 PM

## 2021-02-15 ENCOUNTER — Other Ambulatory Visit: Payer: Self-pay | Admitting: Internal Medicine

## 2021-02-24 ENCOUNTER — Other Ambulatory Visit: Payer: Self-pay | Admitting: Internal Medicine

## 2021-02-24 ENCOUNTER — Other Ambulatory Visit: Payer: Self-pay | Admitting: Family Medicine

## 2021-03-02 ENCOUNTER — Other Ambulatory Visit: Payer: Self-pay

## 2021-03-02 ENCOUNTER — Encounter: Payer: Self-pay | Admitting: Pulmonary Disease

## 2021-03-02 ENCOUNTER — Ambulatory Visit (INDEPENDENT_AMBULATORY_CARE_PROVIDER_SITE_OTHER): Payer: Medicare PPO | Admitting: Pulmonary Disease

## 2021-03-02 VITALS — BP 110/70 | HR 97 | Temp 97.5°F | Ht 61.0 in | Wt 162.8 lb

## 2021-03-02 DIAGNOSIS — K449 Diaphragmatic hernia without obstruction or gangrene: Secondary | ICD-10-CM

## 2021-03-02 DIAGNOSIS — R053 Chronic cough: Secondary | ICD-10-CM

## 2021-03-02 DIAGNOSIS — K219 Gastro-esophageal reflux disease without esophagitis: Secondary | ICD-10-CM

## 2021-03-02 DIAGNOSIS — R059 Cough, unspecified: Secondary | ICD-10-CM

## 2021-03-02 DIAGNOSIS — J683 Other acute and subacute respiratory conditions due to chemicals, gases, fumes and vapors: Secondary | ICD-10-CM | POA: Diagnosis not present

## 2021-03-02 NOTE — Patient Instructions (Signed)
Continue using your Flovent, make sure you rinse your mouth well after you use it.  You may use a little baking soda in the water to rinse with.  I think it would be prudent to get a follow-up appointment with your gastroenterologist.  I suspect you may need to have your hiatal hernia repaired but they can make a full assessment of this.  We are going to get breathing tests to evaluate your issues with potential asthma though, these issues may actually be related to chronic reflux from your hiatal hernia.  We will see you in follow-up in 2 months time call sooner should any new problems arise.

## 2021-03-02 NOTE — Progress Notes (Signed)
Subjective:    Patient ID: Sandra Hendrix, female    DOB: May 28, 1946, 75 y.o.   MRN: [id number] Chief Complaint  Patient presents with   pulmonary consult    Per Dr. Derrel Nip-- CXR 12/08/2020. C/o non prod cough and occ wheezing    [name] is a 75 year old lifelong never smoker who carries a diagnosis of asthma who presents for evaluation of a chronic cough of over 2 years duration.  She is kindly referred by Dr. Deborra Medina.  The patient states that approximately 10 years ago she was diagnosed with asthma symptoms were mostly cough, occasional sputum production that occasionally changes from white to light green/yellow.  It appears that she was evaluated by Dr. Asencion Noble around 2010 however, those notes are not available in [place].  She does note issues with dysphagia and significant gastroesophageal reflux symptoms.  She has a known hiatal hernia.  She was recently started on Flovent and notes that this has helped her with her cough somewhat.  She states that she was told previously that she had asthma but does not recall pulmonary function tests done.  She does not note any orthopnea, paroxysmal nocturnal dyspnea but does note occasional lower extremity edema.  She does not have a military history.  No exotic pets in the home.  No unusual hobbies.  No hot tubs in the home.  She is originally from [place], has resided in [place] and in [place].   Review of Systems A 10 point review of systems was performed and it is as noted above otherwise negative.  Past Medical History:  Diagnosis Date   Allergy    Anxiety    Arthritis    Barrett's esophagus determined by endoscopy    Cancer ([place])    Uterine Cancer   Chronic kidney disease    Chronic venous insufficiency    Depression    Diabetes mellitus without complication ([place])    Difficult intubation    "not sure what happened,asleep was told had problem with intubation"   Diverticulosis    Fatty liver    GERD  (gastroesophageal reflux disease)    History of bronchitis    History of hiatal hernia    Hyperlipidemia    Hypertension    Irritable bowel syndrome with constipation    Sleep apnea    Use C- PAP   Urinary incontinence    Varicose veins    Past Surgical History:  Procedure Laterality Date   CARDIAC CATHETERIZATION     CHOLECYSTECTOMY     COLONOSCOPY WITH PROPOFOL N/A 06/14/2016   Procedure: COLONOSCOPY WITH PROPOFOL;  Surgeon: Lollie Sails, MD;  Location: [facility] ENDOSCOPY;  Service: Endoscopy;  Laterality: N/A;   COLONOSCOPY WITH PROPOFOL N/A 12/18/2020   Procedure: COLONOSCOPY WITH PROPOFOL;  Surgeon: Lesly Rubenstein, MD;  Location: [facility] ENDOSCOPY;  Service: Endoscopy;  Laterality: N/A;   ESOPHAGOGASTRODUODENOSCOPY N/A 12/18/2020   Procedure: ESOPHAGOGASTRODUODENOSCOPY (EGD);  Surgeon: Lesly Rubenstein, MD;  Location[facility] ENDOSCOPY;  Service: Endoscopy;  Laterality: N/A;   ESOPHAGOGASTRODUODENOSCOPY (EGD) WITH PROPOFOL N/A 06/14/2016   Procedure: ESOPHAGOGASTRODUODENOSCOPY (EGD) WITH PROPOFOL;  Surgeon: Lollie Sails, MD;  Location: [facility] ENDOSCOPY;  Service: Endoscopy;  Laterality: N/A;   ESOPHAGOGASTRODUODENOSCOPY (EGD) WITH PROPOFOL N/A 06/12/2017   Procedure: ESOPHAGOGASTRODUODENOSCOPY (EGD) WITH PROPOFOL;  Surgeon: Lollie Sails, MD;  Location: [facility] ENDOSCOPY;  Service: Endoscopy;  Laterality: N/A;   ESOPHAGOGASTRODUODENOSCOPY (EGD) WITH PROPOFOL N/A 08/25/2017   Procedure: ESOPHAGOGASTRODUODENOSCOPY (EGD) WITH PROPOFOL;  Surgeon: Loistine Simas  U, MD;  Location: [facility] ENDOSCOPY;  Service: Endoscopy;  Laterality: N/A;   HYSTEROSCOPY WITH D & C N/A 01/02/2017   Procedure: DILATATION AND CURETTAGE /HYSTEROSCOPY;  Surgeon: Brayton Mars, MD;  Location: [facility] ORS;  Service: Gynecology;  Laterality: N/A;   JOINT REPLACEMENT Right 2007   Total Knee Replacement   JOINT REPLACEMENT Left 2004   Total Knee Replacement   LAPAROSCOPIC HYSTERECTOMY Bilateral 03/01/2017    Procedure: HYSTERECTOMY TOTAL LAPAROSCOPIC BSO;  Surgeon: Mellody Drown, MD;  Location: [facility] ORS;  Service: Gynecology;  Laterality: Bilateral;   ROTATOR CUFF REPAIR Right    SENTINEL NODE BIOPSY N/A 03/01/2017   Procedure: SENTINEL NODE INJECTION AND BIOPSY;  Surgeon: Mellody Drown, MD;  Location: [facility] ORS;  Service: Gynecology;  Laterality: N/A;   TONSILLECTOMY     as a child   TUBAL LIGATION     UPPER GI ENDOSCOPY     x2   Family History  Problem Relation Age of Onset   Stroke Mother    Hypertension Mother    Lupus Mother    Alcohol abuse Father    Diabetes Father    Cancer Father        gallbladder ca into liver    Arthritis Sister    Diabetes Sister    Lumbar disc disease Sister    Hyperlipidemia Daughter    Hypertension Daughter    Cancer Maternal Aunt        ovarian ca   Diabetes Paternal Aunt    Drug abuse Paternal Aunt    Cancer Maternal Grandfather        colon ca   Hypertension Brother    Alcohol abuse Brother    Stroke Brother    Social History   Tobacco Use   Smoking status: Never   Smokeless tobacco: Never  Substance Use Topics   Alcohol use: Not Currently    Comment: rare   Allergies  Allergen Reactions   Clindamycin/Lincomycin Rash   Codeine Rash   Lincomycin Rash   Morphine And Related Nausea Only   Talwin [Pentazocine] Nausea And Vomiting   Lisinopril Cough    cough   Naloxone Other (See Comments)    Had issues with sedation and vomiting with EGD   Oxycodone-Acetaminophen Other (See Comments)    Pt states this medication makes her bowels stop moving   Warfarin And Related Nausea Only   Current Meds  Medication Sig   acetaminophen (TYLENOL) 500 MG tablet Take 2 tablets (1,000 mg total) by mouth every 6 (six) hours. (Patient taking differently: Take 1,000 mg by mouth every 6 (six) hours as needed.)   albuterol (PROVENTIL HFA;VENTOLIN HFA) 108 (90 BASE) MCG/ACT inhaler Inhale 2 puffs into the lungs every 6 (six) hours as needed for  wheezing or shortness of breath.    Ascorbic Acid 500 MG CHEW Chew by mouth.   aspirin EC 81 MG tablet Take 81 mg by mouth every evening.    atorvastatin (LIPITOR) 40 MG tablet TAKE 1/2 TABLET EVERY DAY AT 6 PM   azelastine (ASTELIN) 0.1 % nasal spray Place 1 spray into both nostrils daily.   benzonatate (TESSALON) 100 MG capsule Take 1 capsule (100 mg total) by mouth 3 (three) times daily as needed for cough.   Biotin 5000 MCG TABS Take by mouth 2 (two) times daily.    blood glucose meter kit and supplies Use to check blood sugar twice daily. ICD 10: E11.29   Cetirizine HCl 10 MG CAPS Take by mouth.  Cinnamon Bark POWD Take by mouth.   COLLAGEN PO Take by mouth 2 (two) times daily. For improved fingernail strength   docusate sodium (COLACE) 100 MG capsule Take 1 capsule (100 mg total) by mouth 2 (two) times daily.   fluticasone (FLONASE) 50 MCG/ACT nasal spray Place 1 spray into both nostrils daily.   fluticasone (FLOVENT DISKUS) 50 MCG/BLIST diskus inhaler Inhale 1 puff into the lungs 2 (two) times daily.   furosemide (LASIX) 20 MG tablet TAKE 1 TABLET EVERY OTHER DAY   insulin detemir (LEVEMIR) 100 UNIT/ML injection INJECT 18 UNITS SUBCUTANEOUSLY AT BEDTIME   Lancet Devices (ACCU-CHEK SOFTCLIX) lancets Use to test blood sugars 1 -2 times daily   loratadine (CLARITIN) 10 MG tablet Take 10 mg by mouth daily.   losartan-hydrochlorothiazide (HYZAAR) 50-12.5 MG tablet TAKE 1 TABLET BY MOUTH  DAILY   Magnesium 250 MG TABS Take 1 tablet by mouth daily.   meclizine (ANTIVERT) 25 MG tablet Take 1 tablet (25 mg total) by mouth 3 (three) times daily as needed for dizziness.   metFORMIN (GLUCOPHAGE) 500 MG tablet Take 1 tablet (500 mg total) by mouth 2 (two) times daily with a meal.   Misc Natural Products (NEURIVA PO) Take by mouth in the morning and at bedtime.   Multiple Vitamin (MULTIVITAMIN) tablet Take 1 tablet by mouth daily.   omega-3 acid ethyl esters (LOVAZA) 1 g capsule Take 1 g by mouth  daily.   oxybutynin (DITROPAN-XL) 10 MG 24 hr tablet Take 1 tablet (10 mg total) by mouth daily.   pantoprazole (PROTONIX) 40 MG tablet Take 1 tablet by mouth twice daily   Probiotic Product (PROBIOTIC-10) CAPS Take 1 capsule by mouth daily.    sertraline (ZOLOFT) 50 MG tablet TAKE 1 TABLET EVERY DAY   tiZANidine (ZANAFLEX) 4 MG tablet tizanidine 4 mg tablet   verapamil (VERELAN PM) 180 MG 24 hr capsule TAKE 1 CAPSULE EVERY DAY   vitamin B-12 (CYANOCOBALAMIN) 1000 MCG tablet Take 1,000 mcg by mouth daily.   Zinc 50 MG TABS Take by mouth daily.   [DISCONTINUED] ACCU-CHEK GUIDE test strip USE 1 STRIP TO CHECK GLUCOSE TWICE DAILY   [DISCONTINUED] JANUVIA 100 MG tablet TAKE 1 TABLET BY MOUTH  DAILY   [DISCONTINUED] nitroGLYCERIN (NITROSTAT) 0.4 MG SL tablet Place 1 tablet (0.4 mg total) under the tongue every 5 (five) minutes as needed for chest pain. (Patient not taking: Reported on 03/31/2021)   [DISCONTINUED] Semaglutide,0.25 or 0.5MG /DOS, (OZEMPIC, 0.25 OR 0.5 MG/DOSE,) 2 MG/1.5ML SOPN Inject 0.25 mg into the skin once a week.   Immunization History  Administered Date(s) Administered   Fluad Quad(high Dose 65+) 06/08/2019   Hep A / Hep B 08/20/2019, 08/20/2019, 09/17/2019   Influenza Split 05/21/2013, 06/06/2014, 05/28/2017   Influenza, High Dose Seasonal PF 05/29/2018   Influenza,inj,Quad PF,6+ Mos 06/05/2015, 06/09/2017   Influenza-Unspecified 06/19/2012, 06/14/2016, 04/26/2018, 07/10/2020   Moderna Sars-Covid-2 Vaccination 10/28/2019, 11/25/2019, 05/15/2020   PFIZER(Purple Top)SARS-COV-2 Vaccination 10/28/2019, 11/25/2019, 05/15/2020   Pneumococcal Conjugate-13 10/21/2013   Pneumococcal Polysaccharide-23 07/15/2004, 05/08/2010   Td 11/22/2003   Tdap 01/17/2014   Zoster, Live 01/17/2014       Objective:   Physical Exam BP 110/70 (BP Location: Left Arm, Cuff Size: Normal)   Pulse 97   Temp (!) 97.5 F (36.4 C) (Temporal)   Ht 5\' 1"  (1.549 m)   Wt 162 lb 12.8 oz (73.8 kg)   SpO2  97%   BMI 30.76 kg/m  GENERAL: Developed somewhat overweight woman, no acute  distress.  Fully ambulatory.  No conversational dyspnea. HEAD: Normocephalic, atraumatic.  EYES: Pupils equal, round, reactive to light.  No scleral icterus.  MOUTH: Nose/mouth/throat not examined due to masking requirements for COVID 19. NECK: Supple. No thyromegaly. Trachea midline. No JVD.  No adenopathy. PULMONARY: Good air entry bilaterally.  No adventitious sounds. CARDIOVASCULAR: S1 and S2. Regular rate and rhythm.  No rubs, murmurs or gallops heard. ABDOMEN: Benign. MUSCULOSKELETAL: No joint deformity, no clubbing, no edema.  NEUROLOGIC: No focal deficit, no gait disturbance, speech is fluent. SKIN: Intact,warm,dry. PSYCH: Mood and behavior normal.  Most recent chest x-ray performed 08 December 2020 independently reviewed, mild left basilar atelectasis and hiatal hernia noted, otherwise benign:     Assessment & Plan:     ICD-10-CM   1. Chronic cough  R05.3 Pulmonary Function Test [facility] Only   Likely due to GERD Large hiatal hernia    2. Reactive airways dysfunction syndrome (HCC)  J68.3    Triggered by chronic silent aspiration Chronic silent aspiration due to GERD Will obtain PFTs to clarify Antireflux measures, PPI    3. Hiatal hernia with GERD  K21.9    K44.9    This is likely the trigger for all of her issues Moderate/large hiatal hernia Consider repair      Orders Placed This Encounter  Procedures   Pulmonary Function Test [facility] Only    Standing Status:   Future    Number of Occurrences:   1    Standing Expiration Date:   03/02/2022    Scheduling Instructions:     4 weeks    Order Specific Question:   Full PFT: includes the following: basic spirometry, spirometry pre & post bronchodilator, diffusion capacity (DLCO), lung volumes    Answer:   Full PFT   Patient's issues with chronic cough are likely related to gastroesophageal reflux with chronic silent aspiration.  She has a  moderate/large hiatal hernia and would consider repair of this.  She is currently not interested in repair.  Recommend antireflux measures and PPI management.  Will obtain PFTs as she may have an element of reactive airways disease/asthma.  She was advised to continue using Flovent for now as this has seemed to help her some.  This will help clarify this issue.  We will see her in follow-up in 2 months time she is to contact us prior to that time should any new difficulties arise.  Renold Don, MD Advanced [facility]choscopy [facility]    *This note was dictated using voice recognition software/Dragon.  Despite best efforts to proofread, errors can occur which can change the meaning.  Any change was purely unintentional.

## 2021-03-04 ENCOUNTER — Other Ambulatory Visit: Payer: Self-pay | Admitting: Family Medicine

## 2021-03-04 ENCOUNTER — Encounter: Payer: Medicare PPO | Admitting: Obstetrics and Gynecology

## 2021-03-08 ENCOUNTER — Telehealth: Payer: Self-pay

## 2021-03-08 NOTE — Telephone Encounter (Signed)
Lm for reminder of covid test prior to PFT  03/10/2021 9:[facility]

## 2021-03-09 DIAGNOSIS — R079 Chest pain, unspecified: Secondary | ICD-10-CM | POA: Diagnosis not present

## 2021-03-09 NOTE — Telephone Encounter (Signed)
Patient is aware of dates/times of covid test prior to PFT.

## 2021-03-10 ENCOUNTER — Other Ambulatory Visit: Payer: Self-pay

## 2021-03-10 ENCOUNTER — Other Ambulatory Visit
Admission: RE | Admit: 2021-03-10 | Discharge: 2021-03-10 | Disposition: A | Discharge status: Home / Self Care | Payer: Medicare PPO | Source: Ambulatory Visit | Attending: Pulmonary Disease | Admitting: Pulmonary Disease

## 2021-03-10 DIAGNOSIS — Z20822 Contact with and (suspected) exposure to covid-19: Secondary | ICD-10-CM | POA: Insufficient documentation

## 2021-03-10 DIAGNOSIS — Z01812 Encounter for preprocedural laboratory examination: Secondary | ICD-10-CM | POA: Diagnosis not present

## 2021-03-10 LAB — SARS CORONAVIRUS 2 (TAT 6-24 HRS): SARS Coronavirus 2: NEGATIVE

## 2021-03-11 ENCOUNTER — Encounter: Payer: Medicare PPO | Admitting: Obstetrics and Gynecology

## 2021-03-11 ENCOUNTER — Ambulatory Visit: Payer: Medicare PPO | Attending: Pulmonary Disease

## 2021-03-11 DIAGNOSIS — R059 Cough, unspecified: Secondary | ICD-10-CM | POA: Diagnosis not present

## 2021-03-11 DIAGNOSIS — R0600 Dyspnea, unspecified: Secondary | ICD-10-CM | POA: Diagnosis not present

## 2021-03-11 MED ORDER — ALBUTEROL SULFATE (2.5 MG/3ML) 0.083% IN NEBU
2.5000 mg | INHALATION_SOLUTION | Freq: Once | RESPIRATORY_TRACT | Status: AC
  Administered 2021-03-11: 2.5 mg via RESPIRATORY_TRACT
  Filled 2021-03-11: qty 3

## 2021-03-12 ENCOUNTER — Encounter: Payer: Medicare PPO | Admitting: Obstetrics and Gynecology

## 2021-03-16 DIAGNOSIS — I872 Venous insufficiency (chronic) (peripheral): Secondary | ICD-10-CM | POA: Diagnosis not present

## 2021-03-16 DIAGNOSIS — Z9989 Dependence on other enabling machines and devices: Secondary | ICD-10-CM | POA: Diagnosis not present

## 2021-03-16 DIAGNOSIS — I10 Essential (primary) hypertension: Secondary | ICD-10-CM | POA: Diagnosis not present

## 2021-03-16 DIAGNOSIS — G4733 Obstructive sleep apnea (adult) (pediatric): Secondary | ICD-10-CM | POA: Diagnosis not present

## 2021-03-18 ENCOUNTER — Other Ambulatory Visit: Payer: Self-pay

## 2021-03-18 ENCOUNTER — Ambulatory Visit: Payer: Medicare PPO | Admitting: Internal Medicine

## 2021-03-18 ENCOUNTER — Encounter: Payer: Self-pay | Admitting: Internal Medicine

## 2021-03-18 DIAGNOSIS — N183 Chronic kidney disease, stage 3 unspecified: Secondary | ICD-10-CM | POA: Diagnosis not present

## 2021-03-18 DIAGNOSIS — E1121 Type 2 diabetes mellitus with diabetic nephropathy: Secondary | ICD-10-CM

## 2021-03-18 DIAGNOSIS — N1831 Chronic kidney disease, stage 3a: Secondary | ICD-10-CM | POA: Diagnosis not present

## 2021-03-18 DIAGNOSIS — Z794 Long term (current) use of insulin: Secondary | ICD-10-CM

## 2021-03-18 DIAGNOSIS — I701 Atherosclerosis of renal artery: Secondary | ICD-10-CM | POA: Diagnosis not present

## 2021-03-18 DIAGNOSIS — E1122 Type 2 diabetes mellitus with diabetic chronic kidney disease: Secondary | ICD-10-CM | POA: Diagnosis not present

## 2021-03-18 DIAGNOSIS — I129 Hypertensive chronic kidney disease with stage 1 through stage 4 chronic kidney disease, or unspecified chronic kidney disease: Secondary | ICD-10-CM | POA: Diagnosis not present

## 2021-03-18 LAB — LIPID PANEL
Cholesterol: 186 mg/dL (ref 0–200)
HDL: 87.2 mg/dL (ref >39.00)
LDL Cholesterol: 80 mg/dL (ref 0–99)
NonHDL: 98.36
Total CHOL/HDL Ratio: 2
Triglycerides: 92 mg/dL (ref 0.0–149.0)
VLDL: 18.4 mg/dL (ref 0.0–40.0)

## 2021-03-18 LAB — COMPREHENSIVE METABOLIC PANEL
ALT: 17 U/L (ref 0–35)
AST: 21 U/L (ref 0–37)
Albumin: 4.3 g/dL (ref 3.5–5.2)
Alkaline Phosphatase: 90 U/L (ref 39–117)
BUN: 25 mg/dL — ABNORMAL HIGH (ref 6–23)
CO2: 28 mEq/L (ref 19–32)
Calcium: 9.4 mg/dL (ref 8.4–10.5)
Chloride: 102 mEq/L (ref 96–112)
Creatinine, Ser: 0.95 mg/dL (ref 0.40–1.20)
GFR: 58.02 mL/min — ABNORMAL LOW (ref >60.00)
Glucose, Bld: 71 mg/dL (ref 70–99)
Potassium: 3.7 mEq/L (ref 3.5–5.1)
Sodium: 141 mEq/L (ref 135–145)
Total Bilirubin: 0.5 mg/dL (ref 0.2–1.2)
Total Protein: 6.7 g/dL (ref 6.0–8.3)

## 2021-03-18 LAB — MICROALBUMIN / CREATININE URINE RATIO
Creatinine,U: 100.2 mg/dL
Microalb Creat Ratio: 0.7 mg/g (ref 0.0–30.0)
Microalb, Ur: 0.7 mg/dL (ref 0.0–1.9)

## 2021-03-18 LAB — HEMOGLOBIN A1C: Hgb A1c MFr Bld: 6.9 % — ABNORMAL HIGH (ref 4.6–6.5)

## 2021-03-18 MED ORDER — OZEMPIC (0.25 OR 0.5 MG/DOSE) 2 MG/1.5ML ~~LOC~~ SOPN: 0.5000 mg | PEN_INJECTOR | SUBCUTANEOUS | 5 refills | Status: DC

## 2021-03-18 NOTE — Patient Instructions (Addendum)
Please increase your Ozempic dose to 0.5 mg daily and you can stop the [place]   If your blood sugars drop below 100 fasting or 120 post prandially,  reduce the LEVEMIR DOSE to 15 units  Return in 3 months

## 2021-03-18 NOTE — Progress Notes (Signed)
Subjective:  Patient ID: Sandra Hendrix, female    DOB: [birth year]  Age: 75 y.o. MRN: [id number]  CC: Diagnoses of Type 2 DM with CKD stage 3 and hypertension ([place]), Type 2 diabetes mellitus with diabetic nephropathy, with long-term current use of insulin ([place]), Atherosclerosis of renal artery ([place]), and Stage 3a chronic kidney disease ([place]) were pertinent to this visit.  [name] presents for follow up on type 2 DM with CKD obesity and hypertension   This visit occurred during the SARS-CoV-2 public health emergency.  Safety protocols were in place, including screening questions prior to the visit, additional usage of staff PPE, and extensive cleaning of exam room while observing appropriate contact time as indicated for disinfecting solutions.   DM: She feels generally well, is exercising several times per week and checking blood sugars once daily at variable times.  BS have been under 130 fasting and < 150 post prandially.  Denies any recent hypoglyemic events.   Following a carbohydrate modified diet 64 days per week. Denies numbness, burning and tingling of extremities. Appetite is good.    taking ozempic,  Januvia metformin and Levemir,  now down 8 lbs   Cough and hoarseness:  taking flovent   and protonix,  enlarging hiatal hernia making GERD worse.  Nissen funduplication offered by GI.  Has not met with a surgeon YET,  cough improving  NO LONGER COUGHING AT NIGHT   Lab Results  Component Value Date   HGBA1C 6.9 (H) 03/18/2021    Outpatient Medications Prior to Visit  Medication Sig Dispense Refill   ACCU-CHEK GUIDE test strip USE 1 STRIP TO CHECK GLUCOSE TWICE DAILY 50 each 0   acetaminophen (TYLENOL) 500 MG tablet Take 2 tablets (1,000 mg total) by mouth every 6 (six) hours. (Patient taking differently: Take 1,000 mg by mouth every 6 (six) hours as needed.) 65 tablet 1   albuterol (PROVENTIL HFA;VENTOLIN HFA) 108 (90 BASE) MCG/ACT inhaler Inhale 2 puffs into the lungs  every 6 (six) hours as needed for wheezing or shortness of breath.      Ascorbic Acid 500 MG CHEW Chew by mouth.     aspirin EC 81 MG tablet Take 81 mg by mouth every evening.      atorvastatin (LIPITOR) 40 MG tablet TAKE 1/2 TABLET EVERY DAY AT 6 PM 45 tablet 1   azelastine (ASTELIN) 0.1 % nasal spray Place 1 spray into both nostrils daily.     benzonatate (TESSALON) 100 MG capsule Take 1 capsule (100 mg total) by mouth 3 (three) times daily as needed for cough. 21 capsule 0   Biotin 5000 MCG TABS Take by mouth 2 (two) times daily.      blood glucose meter kit and supplies Use to check blood sugar twice daily. ICD 10: E11.29 1 each 0   Cetirizine HCl 10 MG CAPS Take by mouth.     Cinnamon Bark POWD Take by mouth.     COLLAGEN PO Take by mouth 2 (two) times daily. For improved fingernail strength     docusate sodium (COLACE) 100 MG capsule Take 1 capsule (100 mg total) by mouth 2 (two) times daily. 20 capsule 0   fluticasone (FLONASE) 50 MCG/ACT nasal spray Place 1 spray into both nostrils daily. 48 g 1   fluticasone (FLOVENT DISKUS) 50 MCG/BLIST diskus inhaler Inhale 1 puff into the lungs 2 (two) times daily.     furosemide (LASIX) 20 MG tablet TAKE 1 TABLET EVERY OTHER DAY  45 tablet 3   insulin detemir (LEVEMIR) 100 UNIT/ML injection INJECT 18 UNITS SUBCUTANEOUSLY AT BEDTIME 10 mL 2   Lancet Devices (ACCU-CHEK SOFTCLIX) lancets Use to test blood sugars 1 -2 times daily 1 each 3   loratadine (CLARITIN) 10 MG tablet Take 10 mg by mouth daily.     losartan-hydrochlorothiazide (HYZAAR) 50-12.5 MG tablet TAKE 1 TABLET BY MOUTH  DAILY 90 tablet 3   Magnesium 250 MG TABS Take 1 tablet by mouth daily.     meclizine (ANTIVERT) 25 MG tablet Take 1 tablet (25 mg total) by mouth 3 (three) times daily as needed for dizziness. 30 tablet 3   metFORMIN (GLUCOPHAGE) 500 MG tablet Take 1 tablet (500 mg total) by mouth 2 (two) times daily with a meal. 180 tablet 2   Misc Natural Products (NEURIVA PO) Take by  mouth in the morning and at bedtime.     Multiple Vitamin (MULTIVITAMIN) tablet Take 1 tablet by mouth daily.     nitroGLYCERIN (NITROSTAT) 0.4 MG SL tablet Place 1 tablet (0.4 mg total) under the tongue every 5 (five) minutes as needed for chest pain. 50 tablet 3   omega-3 acid ethyl esters (LOVAZA) 1 g capsule Take 1 g by mouth daily.     oxybutynin (DITROPAN-XL) 10 MG 24 hr tablet Take 1 tablet (10 mg total) by mouth daily. 90 tablet 3   pantoprazole (PROTONIX) 40 MG tablet Take 1 tablet by mouth twice daily 180 tablet 1   Probiotic Product (PROBIOTIC-10) CAPS Take 1 capsule by mouth daily.      sertraline (ZOLOFT) 50 MG tablet TAKE 1 TABLET EVERY DAY 90 tablet 3   tiZANidine (ZANAFLEX) 4 MG tablet tizanidine 4 mg tablet     verapamil (VERELAN PM) 180 MG 24 hr capsule TAKE 1 CAPSULE EVERY DAY 90 capsule 3   vitamin B-12 (CYANOCOBALAMIN) 1000 MCG tablet Take 1,000 mcg by mouth daily.     Zinc 50 MG TABS Take by mouth daily.     JANUVIA 100 MG tablet TAKE 1 TABLET BY MOUTH  DAILY 90 tablet 3   Semaglutide,0.25 or 0.5MG/DOS, (OZEMPIC, 0.25 OR 0.5 MG/DOSE,) 2 MG/1.5ML SOPN Inject 0.25 mg into the skin once a week. 1.5 mL 5   sucralfate (CARAFATE) 1 g tablet Take 1 tablet (1 g total) by mouth 4 (four) times daily for 5 days. 120 tablet 0   No facility-administered medications prior to visit.    Review of Systems;  Patient denies headache, fevers, malaise, unintentional weight loss, skin rash, eye pain, sinus congestion and sinus pain, sore throat, dysphagia,  hemoptysis , cough, dyspnea, wheezing, chest pain, palpitations, orthopnea, edema, abdominal pain, nausea, melena, diarrhea, constipation, flank pain, dysuria, hematuria, urinary  Frequency, nocturia, numbness, tingling, seizures,  Focal weakness, Loss of consciousness,  Tremor, insomnia, depression, anxiety, and suicidal ideation.      Objective:  BP 116/74 (BP Location: Left Arm, Patient Position: Sitting, Cuff Size: Normal)   Pulse  65   Temp (!) 96 F (35.6 C) (Temporal)   Resp 16   Ht '5\' 1"'  (1.549 m)   Wt 160 lb 9.6 oz (72.8 kg)   SpO2 94%   BMI 30.35 kg/m   BP Readings from Last 3 Encounters:  03/18/21 116/74  03/02/21 110/70  02/08/21 97/65    Wt Readings from Last 3 Encounters:  03/18/21 160 lb 9.6 oz (72.8 kg)  03/02/21 162 lb 12.8 oz (73.8 kg)  02/08/21 165 lb (74.8 kg)    General  appearance: alert, cooperative and appears stated age Ears: normal TM's and external ear canals both ears Throat: lips, mucosa, and tongue normal; teeth and gums normal Neck: no adenopathy, no carotid bruit, supple, symmetrical, trachea midline and thyroid not enlarged, symmetric, no tenderness/mass/nodules Back: symmetric, no curvature. ROM normal. No CVA tenderness. Lungs: clear to auscultation bilaterally Heart: regular rate and rhythm, S1, S2 normal, no murmur, click, rub or gallop Abdomen: soft, non-tender; bowel sounds normal; no masses,  no organomegaly Pulses: 2+ and symmetric Skin: Skin color, texture, turgor normal. No rashes or lesions Lymph nodes: Cervical, supraclavicular, and axillary nodes normal.  Lab Results  Component Value Date   HGBA1C 6.9 (H) 03/18/2021   HGBA1C 7.6 (H) 12/10/2020   HGBA1C 8.0 (H) 09/09/2020    Lab Results  Component Value Date   CREATININE 0.95 03/18/2021   CREATININE 0.90 12/10/2020   CREATININE 0.97 12/08/2020    Lab Results  Component Value Date   WBC 4.8 12/08/2020   HGB 12.0 12/08/2020   HCT 36.2 12/08/2020   PLT 270 12/08/2020   GLUCOSE 71 03/18/2021   CHOL 186 03/18/2021   TRIG 92.0 03/18/2021   HDL 87.20 03/18/2021   LDLDIRECT 80.0 02/22/2017   LDLCALC 80 03/18/2021   ALT 17 03/18/2021   AST 21 03/18/2021   NA 141 03/18/2021   K 3.7 03/18/2021   CL 102 03/18/2021   CREATININE 0.95 03/18/2021   BUN 25 (H) 03/18/2021   CO2 28 03/18/2021   TSH 2.95 12/10/2020   INR 1.1 01/23/2009   HGBA1C 6.9 (H) 03/18/2021   MICROALBUR 0.7 03/18/2021     Pulmonary Function Test [facility] Only  Result Date: 03/17/2021 Spirometry Data Is Acceptable and Reproducible No obvious evidence of Obstructive Airways disease or Restrictive Lung disease Consider outpatient follow up with Pulmonary if needed. Clinical Correlation Advised    Assessment & Plan:   Problem List Items Addressed This Visit       Unprioritized   Atherosclerosis of renal artery ([place])    Reviewed with patient.  She is tolerating asa, atorvastatin and BP is at goal        CKD (chronic kidney disease) stage 3, GFR 30-59 ml/min ([facility])    Stable for over 4 years .  Monitored by Nephrology.. She is avoiding nephrotoxic agents   Lab Results  Component Value Date   CREATININE 0.95 03/18/2021   Lab Results  Component Value Date   NA 141 03/18/2021   K 3.7 03/18/2021   CL 102 03/18/2021   CO2 28 03/18/2021         Type 2 DM with CKD stage 3 and hypertension ([place])    Currently managed with ozempic 0.25 , januvia, metformin and Levemir 18 units daily . She lhas lost 8 lbs on Ozempic and is exercising regularly.  Recommending thst she stop Januvia today,  Increase ozempic to 0.5 mg weekly.  Advised to reduce Levemir dose by 3 units for hypglycemia   Lab Results  Component Value Date   HGBA1C 6.9 (H) 03/18/2021   Lab Results  Component Value Date   LABMICR See below: 09/30/2019   MICROALBUR 0.7 03/18/2021   MICROALBUR <0.7 01/14/2020            Relevant Medications   Semaglutide,0.25 or 0.5MG/DOS, (OZEMPIC, 0.25 OR 0.5 MG/DOSE,) 2 MG/1.5ML SOPN   Other Relevant Orders   Microalbumin / creatinine urine ratio (Completed)   Other Visit Diagnoses     Type 2 diabetes mellitus with diabetic nephropathy,  with long-term current use of insulin ([facility])       Relevant Medications   Semaglutide,0.25 or 0.5MG/DOS, (OZEMPIC, 0.25 OR 0.5 MG/DOSE,) 2 MG/1.5ML SOPN       I have discontinued Azariah P. Dondlinger's Januvia and Ozempic (0.25 or 0.5 MG/DOSE). I have also  changed her Ozempic (0.25 or 0.5 MG/DOSE). Additionally, I am having her maintain her multivitamin, albuterol, aspirin EC, Probiotic-10, loratadine, azelastine, Biotin, acetaminophen, docusate sodium, accu-chek softclix, vitamin B-12, Cinnamon Bark, Magnesium, COLLAGEN PO, fluticasone, meclizine, nitroGLYCERIN, Zinc, tiZANidine, Cetirizine HCl, Ascorbic Acid, blood glucose meter kit and supplies, metFORMIN, oxybutynin, benzonatate, Misc Natural Products (NEURIVA PO), losartan-hydrochlorothiazide, pantoprazole, sucralfate, omega-3 acid ethyl esters, insulin detemir, fluticasone, Accu-Chek Guide, verapamil, furosemide, sertraline, and atorvastatin.  Meds ordered this encounter  Medications   DISCONTD: Semaglutide,0.25 or 0.5MG/DOS, (OZEMPIC, 0.25 OR 0.5 MG/DOSE,) 2 MG/1.5ML SOPN    Sig: Inject 0.5 mg into the skin once a week.    Dispense:  1.5 mL    Refill:  5   Semaglutide,0.25 or 0.5MG/DOS, (OZEMPIC, 0.25 OR 0.5 MG/DOSE,) 2 MG/1.5ML SOPN    Sig: Inject 0.5 mg into the skin once a week.    Dispense:  4.5 mL    Refill:  5    I spent 30 mintutes dedicated to the care of this patient on the date of this encounter to include pre-visit review of his medical history,  Face-to-face time with the patient , and post visit ordering of testing and therapeutics.   Medications Discontinued During This Encounter  Medication Reason   Semaglutide,0.25 or 0.5MG/DOS, (OZEMPIC, 0.25 OR 0.5 MG/DOSE,) 2 MG/1.5ML SOPN    Semaglutide,0.25 or 0.5MG/DOS, (OZEMPIC, 0.25 OR 0.5 MG/DOSE,) 2 MG/1.5ML SOPN    JANUVIA 100 MG tablet     Follow-up: Return in about 3 months (around 06/18/2021) for follow up diabetes.   Crecencio Mc, MD

## 2021-03-18 NOTE — Assessment & Plan Note (Addendum)
Currently managed with ozempic 0.25 , januvia, metformin and Levemir 18 units daily . She lhas lost 8 lbs on Ozempic and is exercising regularly.  Recommending thst she stop Januvia today,  Increase ozempic to 0.5 mg weekly.  Advised to reduce Levemir dose by 3 units for hypglycemia   Lab Results  Component Value Date   HGBA1C 6.9 (H) 03/18/2021   Lab Results  Component Value Date   LABMICR See below: 09/30/2019   MICROALBUR 0.7 03/18/2021   MICROALBUR <0.7 01/14/2020

## 2021-03-21 NOTE — Assessment & Plan Note (Addendum)
Reviewed with patient.  She is tolerating asa, atorvastatin and BP is at goal

## 2021-03-21 NOTE — Assessment & Plan Note (Signed)
Stable for over 4 years .  Monitored by Nephrology.. She is avoiding nephrotoxic agents   Lab Results  Component Value Date   CREATININE 0.95 03/18/2021   Lab Results  Component Value Date   NA 141 03/18/2021   K 3.7 03/18/2021   CL 102 03/18/2021   CO2 28 03/18/2021

## 2021-03-30 ENCOUNTER — Telehealth: Payer: Self-pay | Admitting: Internal Medicine

## 2021-03-30 NOTE — Telephone Encounter (Signed)
Sheli Grant (Key: [id number]) Rx #: [id number] Ozempic (0.25 or 0.5 MG/DOSE) 2MG /1.5ML pen-injectors

## 2021-03-31 ENCOUNTER — Ambulatory Visit (INDEPENDENT_AMBULATORY_CARE_PROVIDER_SITE_OTHER): Payer: Medicare PPO | Admitting: Obstetrics and Gynecology

## 2021-03-31 ENCOUNTER — Encounter: Payer: Self-pay | Admitting: Obstetrics and Gynecology

## 2021-03-31 ENCOUNTER — Other Ambulatory Visit: Payer: Self-pay

## 2021-03-31 VITALS — BP 117/73 | HR 68 | Ht 61.0 in | Wt 158.4 lb

## 2021-03-31 DIAGNOSIS — Z01419 Encounter for gynecological examination (general) (routine) without abnormal findings: Secondary | ICD-10-CM | POA: Diagnosis not present

## 2021-03-31 DIAGNOSIS — Z780 Asymptomatic menopausal state: Secondary | ICD-10-CM | POA: Diagnosis not present

## 2021-03-31 DIAGNOSIS — E663 Overweight: Secondary | ICD-10-CM

## 2021-03-31 DIAGNOSIS — C55 Malignant neoplasm of uterus, part unspecified: Secondary | ICD-10-CM

## 2021-03-31 DIAGNOSIS — N183 Chronic kidney disease, stage 3 unspecified: Secondary | ICD-10-CM

## 2021-03-31 DIAGNOSIS — N3941 Urge incontinence: Secondary | ICD-10-CM

## 2021-03-31 NOTE — Progress Notes (Signed)
Pt present for annual exam. Pt stated that she was doing well.

## 2021-03-31 NOTE — Progress Notes (Signed)
ANNUAL PREVENTATIVE CARE GYNECOLOGY  ENCOUNTER NOTE  Subjective:       Sandra Hendrix is a 75 y.o. G2P2002 female here with PMH of well-differentiated endometrioid adenocarcinoma the uterus, Stage IB s/p hysterectomy BSO with sentinel lymph node sampling in 02/2017, status post pelvic radiation therapy, presents for a routine annual gynecologic exam and routine surveillance.  Last seen by Oncologist 6 months ago. The patient is not sexually active. She is not taking hormone replacement therapy. Patient denies post-menopausal vaginal bleeding. The patient wears seatbelts: yes. The patient participates in regular exercise: yes (3 times per week). Has the patient ever been transfused or tattooed?: no.    Current complaints: 1.  Currently dealing with reflux (found out this was the cause of her hoarseness). On a new medication.  2. Also started on Ozempic for better management of her diabetes.  Is proud to note her HgbA1c is down to 6.9. 3. Notes urge incontinence remains well controlled on medication.    Gynecologic History No LMP recorded. Patient has had a hysterectomy. Contraception: post menopausal status and patient has had a hysterectomy. Last Pap: 10/17/2016. Results were: normal Last mammogram: 07/09/2020 at [place]. Results were: normal Last Colonoscopy: 06/14/2016. Results were: diverticulosis and internal hemorrhoids, but otherwise normal.  Last Dexa Scan:  10/23/2018. Results were: normal.  T-score 0.3.  PCP: Dr. Deborra Medina.    Obstetric History OB History  Gravida Para Term Preterm AB Living  '2 2 2     2  ' SAB IAB Ectopic Multiple Live Births          2    # Outcome Date GA Lbr Len/2nd Weight Sex Delivery Anes PTL Lv  2 Term 1972   9 lb (4.082 kg) F Vag-Spont   LIV  1 Term 1970   9 lb (4.082 kg) F Vag-Spont   LIV    Past Medical History:  Diagnosis Date   Allergy    Anxiety    Arthritis    Barrett's esophagus determined by endoscopy    Cancer ([place])     Uterine Cancer   Chronic kidney disease    Chronic venous insufficiency    Depression    Diabetes mellitus without complication ([place])    Difficult intubation    "not sure what happened,asleep was told had problem with intubation"   Diverticulosis    Fatty liver    GERD (gastroesophageal reflux disease)    History of bronchitis    History of hiatal hernia    Hyperlipidemia    Hypertension    Irritable bowel syndrome with constipation    Sleep apnea    Use C- PAP   Urinary incontinence    Varicose veins     Family History  Problem Relation Age of Onset   Stroke Mother    Hypertension Mother    Lupus Mother    Alcohol abuse Father    Diabetes Father    Cancer Father        gallbladder ca into liver    Arthritis Sister    Diabetes Sister    Lumbar disc disease Sister    Hyperlipidemia Daughter    Hypertension Daughter    Cancer Maternal Aunt        ovarian ca   Diabetes Paternal Aunt    Drug abuse Paternal Aunt    Cancer Maternal Grandfather        colon ca   Hypertension Brother    Alcohol abuse Brother    Stroke  Brother     Past Surgical History:  Procedure Laterality Date   CARDIAC CATHETERIZATION     CHOLECYSTECTOMY     COLONOSCOPY WITH PROPOFOL N/A 06/14/2016   Procedure: COLONOSCOPY WITH PROPOFOL;  Surgeon: Lollie Sails, MD;  Location: [facility] ENDOSCOPY;  Service: Endoscopy;  Laterality: N/A;   COLONOSCOPY WITH PROPOFOL N/A 12/18/2020   Procedure: COLONOSCOPY WITH PROPOFOL;  Surgeon: Lesly Rubenstein, MD;  Location: [facility] ENDOSCOPY;  Service: Endoscopy;  Laterality: N/A;   ESOPHAGOGASTRODUODENOSCOPY N/A 12/18/2020   Procedure: ESOPHAGOGASTRODUODENOSCOPY (EGD);  Surgeon: Lesly Rubenstein, MD;  Location: [facility] ENDOSCOPY;  Service: Endoscopy;  Laterality: N/A;   ESOPHAGOGASTRODUODENOSCOPY (EGD) WITH PROPOFOL N/A 06/14/2016   Procedure: ESOPHAGOGASTRODUODENOSCOPY (EGD) WITH PROPOFOL;  Surgeon: Lollie Sails, MD;  Location: [facility] ENDOSCOPY;  Service:  Endoscopy;  Laterality: N/A;   ESOPHAGOGASTRODUODENOSCOPY (EGD) WITH PROPOFOL N/A 06/12/2017   Procedure: ESOPHAGOGASTRODUODENOSCOPY (EGD) WITH PROPOFOL;  Surgeon: Lollie Sails, MD;  Location: [facility] ENDOSCOPY;  Service: Endoscopy;  Laterality: N/A;   ESOPHAGOGASTRODUODENOSCOPY (EGD) WITH PROPOFOL N/A 08/25/2017   Procedure: ESOPHAGOGASTRODUODENOSCOPY (EGD) WITH PROPOFOL;  Surgeon: Lollie Sails, MD;  Location: [facility] ENDOSCOPY;  Service: Endoscopy;  Laterality: N/A;   HYSTEROSCOPY WITH D & C N/A 01/02/2017   Procedure: DILATATION AND CURETTAGE /HYSTEROSCOPY;  Surgeon: Brayton Mars, MD;  Location: [facility] ORS;  Service: Gynecology;  Laterality: N/A;   JOINT REPLACEMENT Right 2007   Total Knee Replacement   JOINT REPLACEMENT Left 2004   Total Knee Replacement   LAPAROSCOPIC HYSTERECTOMY Bilateral 03/01/2017   Procedure: HYSTERECTOMY TOTAL LAPAROSCOPIC BSO;  Surgeon: Mellody Drown, MD;  Location: [facility] ORS;  Service: Gynecology;  Laterality: Bilateral;   ROTATOR CUFF REPAIR Right    SENTINEL NODE BIOPSY N/A 03/01/2017   Procedure: SENTINEL NODE INJECTION AND BIOPSY;  Surgeon: Mellody Drown, MD;  Location: [facility] ORS;  Service: Gynecology;  Laterality: N/A;   TONSILLECTOMY     as a child   TUBAL LIGATION     UPPER GI ENDOSCOPY     x2    Social History   Socioeconomic History   Marital status: Married    Spouse name: Not on file   Number of children: 2   Years of education: 14   Highest education level: Some college, no degree  Occupational History   Not on file  Tobacco Use   Smoking status: Never   Smokeless tobacco: Never  Vaping Use   Vaping Use: Never used  Substance and Sexual Activity   Alcohol use: Not Currently    Comment: rare   Drug use: No   Sexual activity: Yes    Comment: not often  Other Topics Concern   Not on file  Social History Narrative   Not on file   Social Determinants of Health   Financial Resource Strain: Not on file  Food Insecurity:  No Food Insecurity   Worried About Running Out of Food in the Last Year: Never true   [place] in the Last Year: Never true  Transportation Needs: No Transportation Needs   Lack of Transportation (Medical): No   Lack of Transportation (Non-Medical): No  Physical Activity: Sufficiently Active   Days of Exercise per Week: 3 days   Minutes of Exercise per Session: 60 min  Stress: No Stress Concern Present   Feeling of Stress : Not at all  Social Connections: Socially Integrated   Frequency of Communication with Friends and Family: More than three times a week   Frequency of Social Gatherings  with Friends and Family: More than three times a week   Attends Religious Services: More than 4 times per year   Active Member of Clubs or Organizations: Yes   Attends Music therapist: More than 4 times per year   Marital Status: Married  Human resources officer Violence: Not At Risk   Fear of Current or Ex-Partner: No   Emotionally Abused: No   Physically Abused: No   Sexually Abused: No    Current Outpatient Medications on File Prior to Visit  Medication Sig Dispense Refill   ACCU-CHEK GUIDE test strip USE 1 STRIP TO CHECK GLUCOSE TWICE DAILY 50 each 0   acetaminophen (TYLENOL) 500 MG tablet Take 2 tablets (1,000 mg total) by mouth every 6 (six) hours. (Patient taking differently: Take 1,000 mg by mouth every 6 (six) hours as needed.) 65 tablet 1   albuterol (PROVENTIL HFA;VENTOLIN HFA) 108 (90 BASE) MCG/ACT inhaler Inhale 2 puffs into the lungs every 6 (six) hours as needed for wheezing or shortness of breath.      Ascorbic Acid 500 MG CHEW Chew by mouth.     aspirin EC 81 MG tablet Take 81 mg by mouth every evening.      atorvastatin (LIPITOR) 40 MG tablet TAKE 1/2 TABLET EVERY DAY AT 6 PM 45 tablet 1   azelastine (ASTELIN) 0.1 % nasal spray Place 1 spray into both nostrils daily.     benzonatate (TESSALON) 100 MG capsule Take 1 capsule (100 mg total) by mouth 3 (three) times  daily as needed for cough. 21 capsule 0   Biotin 5000 MCG TABS Take by mouth 2 (two) times daily.      blood glucose meter kit and supplies Use to check blood sugar twice daily. ICD 10: E11.29 1 each 0   Cetirizine HCl 10 MG CAPS Take by mouth.     Cinnamon Bark POWD Take by mouth.     COLLAGEN PO Take by mouth 2 (two) times daily. For improved fingernail strength     docusate sodium (COLACE) 100 MG capsule Take 1 capsule (100 mg total) by mouth 2 (two) times daily. 20 capsule 0   fluticasone (FLONASE) 50 MCG/ACT nasal spray Place 1 spray into both nostrils daily. 48 g 1   fluticasone (FLOVENT DISKUS) 50 MCG/BLIST diskus inhaler Inhale 1 puff into the lungs 2 (two) times daily.     furosemide (LASIX) 20 MG tablet TAKE 1 TABLET EVERY OTHER DAY 45 tablet 3   insulin detemir (LEVEMIR) 100 UNIT/ML injection INJECT 18 UNITS SUBCUTANEOUSLY AT BEDTIME 10 mL 2   Lancet Devices (ACCU-CHEK SOFTCLIX) lancets Use to test blood sugars 1 -2 times daily 1 each 3   loratadine (CLARITIN) 10 MG tablet Take 10 mg by mouth daily.     losartan-hydrochlorothiazide (HYZAAR) 50-12.5 MG tablet TAKE 1 TABLET BY MOUTH  DAILY 90 tablet 3   Magnesium 250 MG TABS Take 1 tablet by mouth daily.     meclizine (ANTIVERT) 25 MG tablet Take 1 tablet (25 mg total) by mouth 3 (three) times daily as needed for dizziness. 30 tablet 3   metFORMIN (GLUCOPHAGE) 500 MG tablet Take 1 tablet (500 mg total) by mouth 2 (two) times daily with a meal. 180 tablet 2   Misc Natural Products (NEURIVA PO) Take by mouth in the morning and at bedtime.     Multiple Vitamin (MULTIVITAMIN) tablet Take 1 tablet by mouth daily.     omega-3 acid ethyl esters (LOVAZA) 1 g capsule Take  1 g by mouth daily.     oxybutynin (DITROPAN-XL) 10 MG 24 hr tablet Take 1 tablet (10 mg total) by mouth daily. 90 tablet 3   pantoprazole (PROTONIX) 40 MG tablet Take 1 tablet by mouth twice daily 180 tablet 1   Probiotic Product (PROBIOTIC-10) CAPS Take 1 capsule by mouth  daily.      Semaglutide,0.25 or 0.5MG/DOS, (OZEMPIC, 0.25 OR 0.5 MG/DOSE,) 2 MG/1.5ML SOPN Inject 0.5 mg into the skin once a week. 4.5 mL 5   sertraline (ZOLOFT) 50 MG tablet TAKE 1 TABLET EVERY DAY 90 tablet 3   tiZANidine (ZANAFLEX) 4 MG tablet tizanidine 4 mg tablet     verapamil (VERELAN PM) 180 MG 24 hr capsule TAKE 1 CAPSULE EVERY DAY 90 capsule 3   vitamin B-12 (CYANOCOBALAMIN) 1000 MCG tablet Take 1,000 mcg by mouth daily.     Zinc 50 MG TABS Take by mouth daily.     No current facility-administered medications on file prior to visit.    Allergies  Allergen Reactions   Clindamycin/Lincomycin Rash   Codeine Rash   Lincomycin Rash   Morphine And Related Nausea Only   Talwin [Pentazocine] Nausea And Vomiting   Lisinopril Cough    cough   Naloxone Other (See Comments)    Had issues with sedation and vomiting with EGD   Oxycodone-Acetaminophen Other (See Comments)    Pt states this medication makes her bowels stop moving   Warfarin And Related Nausea Only      Review of Systems ROS Review of Systems - General ROS: negative for - chills, fatigue, fever, hot flashes, night sweats, weight gain or weight loss Psychological ROS: negative for - anxiety, decreased libido, depression, mood swings, physical abuse or sexual abuse Ophthalmic ROS: negative for - blurry vision, eye pain or loss of vision ENT ROS: negative for - headaches, hearing change, visual changes or vocal changes Allergy and Immunology ROS: negative for - hives, itchy/watery eyes or seasonal allergies Hematological and Lymphatic ROS: negative for - bleeding problems, bruising, swollen lymph nodes or weight loss Endocrine ROS: negative for - galactorrhea, hair pattern changes, hot flashes, malaise/lethargy, mood swings, palpitations, polydipsia/polyuria, skin changes, temperature intolerance or unexpected weight changes Breast ROS: negative for - new or changing breast lumps or nipple discharge Respiratory ROS:  negative for - cough or shortness of breath Cardiovascular ROS: negative for - chest pain, irregular heartbeat, palpitations or shortness of breath Gastrointestinal ROS: no abdominal pain, change in bowel habits, or black or bloody stools Genito-Urinary ROS: no dysuria, trouble voiding, or hematuria Musculoskeletal ROS: negative for - joint pain or joint stiffness Neurological ROS: negative for - bowel and bladder control changes Dermatological ROS: negative for rash and skin lesion changes   Objective:   BP 117/73 (BP Location: Left Arm, Patient Position: Sitting)   Pulse 68   Ht '5\' 1"'  (1.549 m)   Wt 158 lb 6 oz (71.8 kg)   BMI 29.92 kg/m  CONSTITUTIONAL: Well-developed, well-nourished female in no acute distress.  PSYCHIATRIC: Normal mood and affect. Normal behavior. Normal judgment and thought content. [place]: Alert and oriented to person, place, and time. Normal muscle tone coordination. No cranial nerve deficit noted. HENT:  Normocephalic, atraumatic, External right and left ear normal. Oropharynx is clear and moist EYES: Conjunctivae and EOM are normal. Pupils are equal, round, and reactive to light. No scleral icterus.  NECK: Normal range of motion, supple, no masses.  Normal thyroid.  SKIN: Skin is warm and dry. No rash  noted. Not diaphoretic. No erythema. No pallor. CARDIOVASCULAR: Normal heart rate noted, regular rhythm, no murmur. RESPIRATORY: Clear to auscultation bilaterally. Effort and breath sounds normal, no problems with respiration noted. BREASTS: Symmetric in size. No masses, skin changes, nipple drainage, or lymphadenopathy. ABDOMEN: Soft, normal bowel sounds, no distention noted.  No tenderness, rebound or guarding.  BLADDER: Normal PELVIC:  Bladder no bladder distension noted  Urethra: normal appearing urethra with no masses, tenderness or lesions  Vulva: normal appearing vulva with no masses, tenderness or lesions  Vagina: atrophic, moderate.  No lesions or  masses.  First degree cystocele.   Cervix: surgically absent  Uterus: surgically absent, vaginal cuff well healed  Adnexa: surgically absent bilateral  RV: External Exam NormaI, No Rectal Masses and Normal Sphincter tone  MUSCULOSKELETAL: Normal range of motion. No tenderness.  No cyanosis, clubbing, or edema.  2+ distal pulses. LYMPHATIC: No Axillary, Supraclavicular, or Inguinal Adenopathy.   Labs: Lab Results  Component Value Date   WBC 4.8 12/08/2020   HGB 12.0 12/08/2020   HCT 36.2 12/08/2020   MCV 89.4 12/08/2020   PLT 270 12/08/2020    Lab Results  Component Value Date   CREATININE 0.95 03/18/2021   BUN 25 (H) 03/18/2021   NA 141 03/18/2021   K 3.7 03/18/2021   CL 102 03/18/2021   CO2 28 03/18/2021    Lab Results  Component Value Date   ALT 17 03/18/2021   AST 21 03/18/2021   ALKPHOS 90 03/18/2021   BILITOT 0.5 03/18/2021    Lab Results  Component Value Date   CHOL 186 03/18/2021   HDL 87.20 03/18/2021   LDLCALC 80 03/18/2021   LDLDIRECT 80.0 02/22/2017   TRIG 92.0 03/18/2021   CHOLHDL 2 03/18/2021    Lab Results  Component Value Date   TSH 2.95 12/10/2020    Lab Results  Component Value Date   HGBA1C 6.9 (H) 03/18/2021     Assessment:    1. Encounter for gynecological examination without abnormal finding   2. Endometrioid adenocarcinoma of uterus ([place])   3. Overweight (BMI 25.0-29.9)   4. Menopause   5. Urge incontinence of urine   6. Type 2 DM with CKD stage 3 and hypertension ([facility])     Plan:  - Continue q 6 month screening rotating between Oncology and GYN.  Patient remains NED. - Pap: Not needed. Patient is s/p hysterectomy and beyond age-based screening. - Mammogram: Ordered - Stool Guaiac Testing:  Patient Is also up to date on colonoscopy.  - Labs:  No labs ordered. Labs done with PCP - Routine preventative health maintenance measures emphasized: Exercise/Diet/Weight control, Tobacco Warnings, Alcohol/Substance use risks and  Stress Management  - Vitamin D and calcium supplementation or prevention of osteoporosis.  - Menopausal, no issues at this time.  - DM and HTN managed by PCP.  - Urge incontinence managed with medication.  - COVID vaccination status: completed 2 shot vaccination series and has received booster.  - Return to [place], MD  [facility]

## 2021-03-31 NOTE — Patient Instructions (Signed)
Breast Self-Awareness Breast self-awareness is knowing how your breasts look and feel. Doing breast self-awareness is important. It allows you to catch a breast problem early while it is still small and can be treated. All women should do breast self-awareness, including women who have had breast implants. Tell your doctorif you notice a change in your breasts. What you need: A mirror. A well-lit room. How to do a breast self-exam A breast self-exam is one way to learn what is normal for your breasts and tocheck for changes. To do a breast self-exam: Look for changes  Take off all the clothes above your waist. Stand in front of a mirror in a room with good lighting. Put your hands on your hips. Push your hands down. Look at your breasts and nipples in the mirror to see if one breast or nipple looks different from the other. Check to see if: The shape of one breast is different. The size of one breast is different. There are wrinkles, dips, and bumps in one breast and not the other. Look at each breast for changes in the skin, such as: Redness. Scaly areas. Look for changes in your nipples, such as: Liquid around the nipples. Bleeding. Dimpling. Redness. A change in where the nipples are.  Feel for changes  Lie on your back on the floor. Feel each breast. To do this, follow these steps: Pick a breast to feel. Put the arm closest to that breast above your head. Use your other arm to feel the nipple area of your breast. Feel the area with the pads of your three middle fingers by making small circles with your fingers. For the first circle, press lightly. For the second circle, press harder. For the third circle, press even harder. Keep making circles with your fingers at the different pressures as you move down your breast. Stop when you feel your ribs. Move your fingers a little toward the center of your body. Start making circles with your fingers again, this time going up until  you reach your collarbone. Keep making up-and-down circles until you reach your armpit. Remember to keep using the three pressures. Feel the other breast in the same way. Sit or stand in the tub or shower. With soapy water on your skin, feel each breast the same way you did in step 2 when you were lying on the floor.  Write down what you find Writing down what you find can help you remember what to tell your doctor. Write down: What is normal for each breast. Any changes you find in each breast, including: The kind of changes you find. Whether you have pain. Size and location of any lumps. When you last had your menstrual period. General tips Check your breasts every month. If you are breastfeeding, the best time to check your breasts is after you feed your baby or after you use a breast pump. If you get menstrual periods, the best time to check your breasts is 5-7 days after your menstrual period is over. With time, you will become comfortable with the self-exam, and you will begin to know if there are changes in your breasts. Contact a doctor if you: See a change in the shape or size of your breasts or nipples. See a change in the skin of your breast or nipples, such as red or scaly skin. Have fluid coming from your nipples that is not normal. Find a lump or thick area that was not there before. Have pain in   your breasts. Have any concerns about your breast health. Summary Breast self-awareness includes looking for changes in your breasts, as well as feeling for changes within your breasts. Breast self-awareness should be done in front of a mirror in a well-lit room. You should check your breasts every month. If you get menstrual periods, the best time to check your breasts is 5-7 days after your menstrual period is over. Let your doctor know of any changes you see in your breasts, including changes in size, changes on the skin, pain or tenderness, or fluid from your nipples that is  not normal. This information is not intended to replace advice given to you by your health care provider. Make sure you discuss any questions you have with your healthcare provider. Document Revised: 05/01/2018 Document Reviewed: 05/01/2018 Elsevier Patient Education  [employer].

## 2021-04-02 ENCOUNTER — Other Ambulatory Visit: Payer: Self-pay | Admitting: Internal Medicine

## 2021-04-06 ENCOUNTER — Telehealth: Payer: Self-pay | Admitting: Internal Medicine

## 2021-04-06 NOTE — Telephone Encounter (Signed)
PT called to inform that after working out yesterday it seems as if she hs a pulled a muscle in her back and wants to know if she can get a relaxer prescribed

## 2021-04-08 NOTE — Telephone Encounter (Signed)
Spoke with pt and she stated that she is feeling better. She stated that she went back to exercising yesterday with no problems.

## 2021-04-09 ENCOUNTER — Other Ambulatory Visit: Payer: Self-pay

## 2021-04-09 MED ORDER — ACCU-CHEK GUIDE VI STRP: ORAL_STRIP | 3 refills | Status: DC

## 2021-04-16 ENCOUNTER — Other Ambulatory Visit: Payer: Self-pay | Admitting: Internal Medicine

## 2021-04-17 DIAGNOSIS — M5442 Lumbago with sciatica, left side: Secondary | ICD-10-CM | POA: Diagnosis not present

## 2021-04-19 ENCOUNTER — Telehealth: Payer: Self-pay

## 2021-04-19 NOTE — Telephone Encounter (Signed)
Call pt  Check pt after being seen at urgent care Offer appt here for follow up if needed

## 2021-04-19 NOTE — Telephone Encounter (Signed)
Pt was seen at [facility] on 04/17/21 for lower back pain.

## 2021-04-19 NOTE — Telephone Encounter (Signed)
Called and spoke to [place]. She states that she was given muscle relaxer and some Prozac and she is feeling better.

## 2021-04-22 DIAGNOSIS — M545 Low back pain, unspecified: Secondary | ICD-10-CM | POA: Diagnosis not present

## 2021-04-26 ENCOUNTER — Other Ambulatory Visit: Payer: Self-pay

## 2021-04-26 ENCOUNTER — Ambulatory Visit: Payer: Medicare PPO | Admitting: Urology

## 2021-04-26 ENCOUNTER — Encounter: Payer: Self-pay | Admitting: Urology

## 2021-04-26 ENCOUNTER — Telehealth: Payer: Self-pay | Admitting: Internal Medicine

## 2021-04-26 VITALS — BP 132/86 | HR 88 | Ht 61.0 in | Wt 158.0 lb

## 2021-04-26 DIAGNOSIS — N3946 Mixed incontinence: Secondary | ICD-10-CM | POA: Diagnosis not present

## 2021-04-26 MED ORDER — OXYBUTYNIN CHLORIDE ER 10 MG PO TB24: 10.0000 mg | ORAL_TABLET | Freq: Every day | ORAL | 3 refills | Status: DC

## 2021-04-26 NOTE — Telephone Encounter (Signed)
Patient went to [facility] and they gave her prednisone, she would like to know how to control sugar while on it. Her levels rise while on it.

## 2021-04-26 NOTE — Progress Notes (Signed)
04/26/2021 10:20 AM   Sandra Hendrix 10-17-1945 [id number]  Referring provider: Crecencio Mc, MD [address] [place],  [place] [address]  Chief Complaint  Patient presents with   Urinary Incontinence    HPI: I was consulted to assess the patient's urinary incontinence worsening over many months. She leaks with coughing sneezing and with bending and lifting if its heavy.  She can leak with walking   She has urge incontinence especially if she holds it long enough.  This is her most bothersome symptom in the morning.  No bedwetting.  2 pads a day moderately wet to soaked     Grade 2 hypermobility the bladder neck and negative cough test.     During urodynamics she emptied well after uroflow.  Maximum bladder capacity 504 mL.  Low pressure bladder overactivity felt as urgency but did not leak.  She said she likely would have been leaking as she was standing.  No stress incontinence with Valsalva pressure of 124 cm of water.  During voluntary voiding she voided 465 mL with a maximum of 12 mils per second.  Maximum voiding pressure 12 cm water.  Residual 30 mL.  EMG activity increased intermittently throughout voiding bladder neck descended 2 to 3 cm.  She had a depressed uroflow pattern.  She also voided with a interrupted pattern.     Patient is mixed incontinence.  She has very mild stress incontinence.  I will treat her with medical behavioral therapy.  If she does not reach her treatment goal I will have to reestablish her goals regarding stress incontinence.  A sling or urethral injectable are reasonable as it is a third line OAB therapy   Patient was a partial responder to Myrbetriq so was given oxybutynin and Vesicare   Incontinence 80% better on oxy.  She said her flow was okay but at the end she got to lean forward and strain to try to empty and would double void a modest amount.  Was a bit bothersome.  She failed Vesicare.     Just a by a family member or  friend she want to try trospium 60 mg and I will see her back in  6 weeks.  We can always go back to oxybutynin or discuss other treatments.  This suggested perhaps a third line therapy may be reasonable and not a stress incontinence treatment   Today Frequency stable Trospium minimal benefit.  Clinically not infected.  Urge incontinence persisted and she went back on oxybutynin.  She is very pleased on it  Today Frequency stable.  Urge incontinence dramatically better.  No infections.  Very pleased on oxybutynin.        PMH: Past Medical History:  Diagnosis Date   Allergy    Anxiety    Arthritis    Barrett's esophagus determined by endoscopy    Cancer ([place])    Uterine Cancer   Chronic kidney disease    Chronic venous insufficiency    Depression    Diabetes mellitus without complication ([place])    Difficult intubation    "not sure what happened,asleep was told had problem with intubation"   Diverticulosis    Fatty liver    GERD (gastroesophageal reflux disease)    History of bronchitis    History of hiatal hernia    Hyperlipidemia    Hypertension    Irritable bowel syndrome with constipation    Sleep apnea    Use C- PAP   Urinary incontinence  Varicose veins     Surgical History: Past Surgical History:  Procedure Laterality Date   CARDIAC CATHETERIZATION     CHOLECYSTECTOMY     COLONOSCOPY WITH PROPOFOL N/A 06/14/2016   Procedure: COLONOSCOPY WITH PROPOFOL;  Surgeon: Lollie Sails, MD;  Location: [facility] ENDOSCOPY;  Service: Endoscopy;  Laterality: N/A;   COLONOSCOPY WITH PROPOFOL N/A 12/18/2020   Procedure: COLONOSCOPY WITH PROPOFOL;  Surgeon: Lesly Rubenstein, MD;  Location: [facility] ENDOSCOPY;  Service: Endoscopy;  Laterality: N/A;   ESOPHAGOGASTRODUODENOSCOPY N/A 12/18/2020   Procedure: ESOPHAGOGASTRODUODENOSCOPY (EGD);  Surgeon: Lesly Rubenstein, MD;  [facility]COPY;  Service: Endoscopy;  Laterality: N/A;   ESOPHAGOGASTRODUODENOSCOPY (EGD) WITH  PROPOFOL N/A 06/14/2016   Procedure: ESOPHAGOGASTRODUODENOSCOPY (EGD) WITH PROPOFOL;  Surgeon: Lollie Sails, MD;  Location[facility] ENDOSCOPY;  Service: Endoscopy;  Laterality: N/A;   ESOPHAGOGASTRODUODENOSCOPY (EGD) WITH PROPOFOL N/A 06/12/2017   Procedure: ESOPHAGOGASTRODUODENOSCOPY (EGD) WITH PROPOFOL;  Surgeon: Lollie Sails, MD;  Location: [facility] ENDOSCOPY;  Service: Endoscopy;  Laterality: N/A;   ESOPHAGOGASTRODUODENOSCOPY (EGD) WITH PROPOFOL N/A 08/25/2017   Procedure: ESOPHAGOGASTRODUODENOSCOPY (EGD) WITH PROPOFOL;  Surgeon: Lollie Sails, MD;  Location: [facility] ENDOSCOPY;  Service: Endoscopy;  Laterality: N/A;   HYSTEROSCOPY WITH D & C N/A 01/02/2017   Procedure: DILATATION AND CURETTAGE /HYSTEROSCOPY;  Surgeon: Brayton Mars, MD;  Location: [facility] ORS;  Service: Gynecology;  Laterality: N/A;   JOINT REPLACEMENT Right 2007   Total Knee Replacement   JOINT REPLACEMENT Left 2004   Total Knee Replacement   LAPAROSCOPIC HYSTERECTOMY Bilateral 03/01/2017   Procedure: HYSTERECTOMY TOTAL LAPAROSCOPIC BSO;  Surgeon: Mellody Drown, MD;  Location: [facility] ORS;  Service: Gynecology;  Laterality: Bilateral;   ROTATOR CUFF REPAIR Right    SENTINEL NODE BIOPSY N/A 03/01/2017   Procedure: SENTINEL NODE INJECTION AND BIOPSY;  Surgeon: Mellody Drown, MD;  Location: [facility] ORS;  Service: Gynecology;  Laterality: N/A;   TONSILLECTOMY     as a child   TUBAL LIGATION     UPPER GI ENDOSCOPY     x2    Home Medications:  Allergies as of 04/26/2021       Reactions   Clindamycin/lincomycin Rash   Codeine Rash   Lincomycin Rash   Morphine And Related Nausea Only   Talwin [pentazocine] Nausea And Vomiting   Lisinopril Cough   cough   Naloxone Other (See Comments)   Had issues with sedation and vomiting with EGD   Oxycodone-acetaminophen Other (See Comments)   Pt states this medication makes her bowels stop moving   Warfarin And Related Nausea Only        Medication List         Accurate as of April 26, 2021 10:20 AM. If you have any questions, ask your nurse or doctor.          Accu-Chek Guide test strip Generic drug: glucose blood USE 1 STRIP TO TEST BLOOD SUGARS  TWICE DAILY   accu-chek softclix lancets Use to test blood sugars 1 -2 times daily   acetaminophen 500 MG tablet Commonly known as: TYLENOL Take 2 tablets (1,000 mg total) by mouth every 6 (six) hours. What changed:  when to take this reasons to take this   albuterol 108 (90 Base) MCG/ACT inhaler Commonly known as: VENTOLIN HFA Inhale 2 puffs into the lungs every 6 (six) hours as needed for wheezing or shortness of breath.   Ascorbic Acid 500 MG Chew Chew by mouth.   aspirin EC 81 MG tablet Take 81 mg by  mouth every evening.   atorvastatin 40 MG tablet Commonly known as: LIPITOR TAKE 1/2 TABLET EVERY DAY AT 6 PM   azelastine 0.1 % nasal spray Commonly known as: ASTELIN Place 1 spray into both nostrils daily.   benzonatate 100 MG capsule Commonly known as: TESSALON Take 1 capsule (100 mg total) by mouth 3 (three) times daily as needed for cough.   Biotin 5000 MCG Tabs Take by mouth 2 (two) times daily.   blood glucose meter kit and supplies Use to check blood sugar twice daily. ICD 10: E11.29   Cetirizine HCl 10 MG Caps Take by mouth.   Cinnamon Bark Powd Take by mouth.   COLLAGEN PO Take by mouth 2 (two) times daily. For improved fingernail strength   docusate sodium 100 MG capsule Commonly known as: Colace Take 1 capsule (100 mg total) by mouth 2 (two) times daily.   fluticasone 50 MCG/ACT nasal spray Commonly known as: FLONASE Place 1 spray into both nostrils daily.   fluticasone 50 MCG/BLIST diskus inhaler Commonly known as: FLOVENT DISKUS Inhale 1 puff into the lungs 2 (two) times daily.   furosemide 20 MG tablet Commonly known as: LASIX TAKE 1 TABLET EVERY OTHER DAY   insulin detemir 100 UNIT/ML injection Commonly known as: Levemir INJECT 18 UNITS  SUBCUTANEOUSLY AT BEDTIME   loratadine 10 MG tablet Commonly known as: CLARITIN Take 10 mg by mouth daily.   losartan-hydrochlorothiazide 50-12.5 MG tablet Commonly known as: HYZAAR TAKE 1 TABLET BY MOUTH  DAILY   Magnesium 250 MG Tabs Take 1 tablet by mouth daily.   meclizine 25 MG tablet Commonly known as: ANTIVERT Take 1 tablet (25 mg total) by mouth 3 (three) times daily as needed for dizziness.   metFORMIN 500 MG tablet Commonly known as: GLUCOPHAGE Take 1 tablet (500 mg total) by mouth 2 (two) times daily with a meal.   multivitamin tablet Take 1 tablet by mouth daily.   NEURIVA PO Take by mouth in the morning and at bedtime.   omega-3 acid ethyl esters 1 g capsule Commonly known as: LOVAZA Take 1 g by mouth daily.   oxybutynin 10 MG 24 hr tablet Commonly known as: DITROPAN-XL Take 1 tablet (10 mg total) by mouth daily.   Ozempic (0.25 or 0.5 MG/DOSE) 2 MG/1.5ML Sopn Generic drug: Semaglutide(0.25 or 0.5MG/DOS) Inject 0.5 mg into the skin once a week.   pantoprazole 40 MG tablet Commonly known as: PROTONIX Take 1 tablet by mouth twice daily   Probiotic-10 Caps Take 1 capsule by mouth daily.   sertraline 50 MG tablet Commonly known as: ZOLOFT TAKE 1 TABLET EVERY DAY   tiZANidine 4 MG tablet Commonly known as: ZANAFLEX tizanidine 4 mg tablet   verapamil 180 MG 24 hr capsule Commonly known as: VERELAN PM TAKE 1 CAPSULE EVERY DAY   vitamin B-12 1000 MCG tablet Commonly known as: CYANOCOBALAMIN Take 1,000 mcg by mouth daily.   Zinc 50 MG Tabs Take by mouth daily.        Allergies:  Allergies  Allergen Reactions   Clindamycin/Lincomycin Rash   Codeine Rash   Lincomycin Rash   Morphine And Related Nausea Only   Talwin [Pentazocine] Nausea And Vomiting   Lisinopril Cough    cough   Naloxone Other (See Comments)    Had issues with sedation and vomiting with EGD   Oxycodone-Acetaminophen Other (See Comments)    Pt states this medication  makes her bowels stop moving   Warfarin And Related Nausea Only  Family History: Family History  Problem Relation Age of Onset   Stroke Mother    Hypertension Mother    Lupus Mother    Alcohol abuse Father    Diabetes Father    Cancer Father        gallbladder ca into liver    Arthritis Sister    Diabetes Sister    Lumbar disc disease Sister    Hyperlipidemia Daughter    Hypertension Daughter    Cancer Maternal Aunt        ovarian ca   Diabetes Paternal Aunt    Drug abuse Paternal Aunt    Cancer Maternal Grandfather        colon ca   Hypertension Brother    Alcohol abuse Brother    Stroke Brother     Social History:  reports that she has never smoked. She has never used smokeless tobacco. She reports previous alcohol use. She reports that she does not use drugs.  ROS:                                        Physical Exam: There were no vitals taken for this visit.  Constitutional:  Alert and oriented, No acute distress. HEENT: [place] AT, moist mucus membranes.  Trachea midline, no masses.   Laboratory Data: Lab Results  Component Value Date   WBC 4.8 12/08/2020   HGB 12.0 12/08/2020   HCT 36.2 12/08/2020   MCV 89.4 12/08/2020   PLT 270 12/08/2020    Lab Results  Component Value Date   CREATININE 0.95 03/18/2021    No results found for: PSA  No results found for: TESTOSTERONE  Lab Results  Component Value Date   HGBA1C 6.9 (H) 03/18/2021    Urinalysis    Component Value Date/Time   COLORURINE YELLOW 10/05/2016 1003   APPEARANCEUR Clear 09/30/2019 1257   LABSPEC 1.010 10/05/2016 1003   PHURINE 6.0 10/05/2016 1003   GLUCOSEU Negative 09/30/2019 1257   GLUCOSEU NEGATIVE 10/05/2016 1003   HGBUR NEGATIVE 10/05/2016 1003   BILIRUBINUR Negative 09/30/2019 1257   KETONESUR NEGATIVE 10/05/2016 1003   PROTEINUR Negative 09/30/2019 1257   PROTEINUR 30 (A) 02/20/2015 2137   UROBILINOGEN 0.2 05/26/2017 1122   UROBILINOGEN 0.2  10/05/2016 1003   NITRITE Negative 09/30/2019 1257   NITRITE NEGATIVE 10/05/2016 1003   LEUKOCYTESUR Negative 09/30/2019 1257    Pertinent Imaging:   Assessment & Plan: 90x3 sent to pharmacy and I will see in 1 year  There are no diagnoses linked to this encounter.  No follow-ups on file.  Reece Packer, MD  [facility] [address] [place], [place] [address] [phone]

## 2021-04-27 NOTE — Telephone Encounter (Signed)
Pt stated that she is on the 1 week taper of prednisone and her sugars have gotten up to 350.

## 2021-04-27 NOTE — Telephone Encounter (Signed)
Spoke with pt and informed her of her medication dosing changes. Pt requested that we send it to her in a mychart message. Message has been sent.

## 2021-04-29 DIAGNOSIS — R600 Localized edema: Secondary | ICD-10-CM | POA: Diagnosis not present

## 2021-04-29 DIAGNOSIS — E1122 Type 2 diabetes mellitus with diabetic chronic kidney disease: Secondary | ICD-10-CM | POA: Diagnosis not present

## 2021-04-29 DIAGNOSIS — N1831 Chronic kidney disease, stage 3a: Secondary | ICD-10-CM | POA: Diagnosis not present

## 2021-04-29 DIAGNOSIS — I10 Essential (primary) hypertension: Secondary | ICD-10-CM | POA: Diagnosis not present

## 2021-05-13 DIAGNOSIS — M5137 Other intervertebral disc degeneration, lumbosacral region: Secondary | ICD-10-CM | POA: Diagnosis not present

## 2021-05-13 DIAGNOSIS — M5136 Other intervertebral disc degeneration, lumbar region: Secondary | ICD-10-CM | POA: Diagnosis not present

## 2021-05-13 DIAGNOSIS — M7918 Myalgia, other site: Secondary | ICD-10-CM | POA: Diagnosis not present

## 2021-05-13 DIAGNOSIS — M9903 Segmental and somatic dysfunction of lumbar region: Secondary | ICD-10-CM | POA: Diagnosis not present

## 2021-05-13 DIAGNOSIS — M9904 Segmental and somatic dysfunction of sacral region: Secondary | ICD-10-CM | POA: Diagnosis not present

## 2021-05-13 DIAGNOSIS — M5442 Lumbago with sciatica, left side: Secondary | ICD-10-CM | POA: Diagnosis not present

## 2021-05-13 DIAGNOSIS — M5451 Vertebrogenic low back pain: Secondary | ICD-10-CM | POA: Diagnosis not present

## 2021-05-18 DIAGNOSIS — M5136 Other intervertebral disc degeneration, lumbar region: Secondary | ICD-10-CM | POA: Diagnosis not present

## 2021-05-18 DIAGNOSIS — M5442 Lumbago with sciatica, left side: Secondary | ICD-10-CM | POA: Diagnosis not present

## 2021-05-18 DIAGNOSIS — M5137 Other intervertebral disc degeneration, lumbosacral region: Secondary | ICD-10-CM | POA: Diagnosis not present

## 2021-05-18 DIAGNOSIS — M9904 Segmental and somatic dysfunction of sacral region: Secondary | ICD-10-CM | POA: Diagnosis not present

## 2021-05-18 DIAGNOSIS — M5451 Vertebrogenic low back pain: Secondary | ICD-10-CM | POA: Diagnosis not present

## 2021-05-18 DIAGNOSIS — M9903 Segmental and somatic dysfunction of lumbar region: Secondary | ICD-10-CM | POA: Diagnosis not present

## 2021-05-18 DIAGNOSIS — M7918 Myalgia, other site: Secondary | ICD-10-CM | POA: Diagnosis not present

## 2021-05-19 DIAGNOSIS — M5442 Lumbago with sciatica, left side: Secondary | ICD-10-CM | POA: Diagnosis not present

## 2021-05-19 DIAGNOSIS — M9903 Segmental and somatic dysfunction of lumbar region: Secondary | ICD-10-CM | POA: Diagnosis not present

## 2021-05-19 DIAGNOSIS — M5451 Vertebrogenic low back pain: Secondary | ICD-10-CM | POA: Diagnosis not present

## 2021-05-19 DIAGNOSIS — M9904 Segmental and somatic dysfunction of sacral region: Secondary | ICD-10-CM | POA: Diagnosis not present

## 2021-05-19 DIAGNOSIS — M5137 Other intervertebral disc degeneration, lumbosacral region: Secondary | ICD-10-CM | POA: Diagnosis not present

## 2021-05-19 DIAGNOSIS — M5136 Other intervertebral disc degeneration, lumbar region: Secondary | ICD-10-CM | POA: Diagnosis not present

## 2021-05-19 DIAGNOSIS — M7918 Myalgia, other site: Secondary | ICD-10-CM | POA: Diagnosis not present

## 2021-05-20 DIAGNOSIS — M5442 Lumbago with sciatica, left side: Secondary | ICD-10-CM | POA: Diagnosis not present

## 2021-05-20 DIAGNOSIS — M9903 Segmental and somatic dysfunction of lumbar region: Secondary | ICD-10-CM | POA: Diagnosis not present

## 2021-05-20 DIAGNOSIS — M9904 Segmental and somatic dysfunction of sacral region: Secondary | ICD-10-CM | POA: Diagnosis not present

## 2021-05-20 DIAGNOSIS — M7918 Myalgia, other site: Secondary | ICD-10-CM | POA: Diagnosis not present

## 2021-05-20 DIAGNOSIS — M5136 Other intervertebral disc degeneration, lumbar region: Secondary | ICD-10-CM | POA: Diagnosis not present

## 2021-05-20 DIAGNOSIS — M5137 Other intervertebral disc degeneration, lumbosacral region: Secondary | ICD-10-CM | POA: Diagnosis not present

## 2021-05-20 DIAGNOSIS — M5451 Vertebrogenic low back pain: Secondary | ICD-10-CM | POA: Diagnosis not present

## 2021-05-24 DIAGNOSIS — M5137 Other intervertebral disc degeneration, lumbosacral region: Secondary | ICD-10-CM | POA: Diagnosis not present

## 2021-05-24 DIAGNOSIS — M5136 Other intervertebral disc degeneration, lumbar region: Secondary | ICD-10-CM | POA: Diagnosis not present

## 2021-05-24 DIAGNOSIS — M9904 Segmental and somatic dysfunction of sacral region: Secondary | ICD-10-CM | POA: Diagnosis not present

## 2021-05-24 DIAGNOSIS — M9903 Segmental and somatic dysfunction of lumbar region: Secondary | ICD-10-CM | POA: Diagnosis not present

## 2021-05-24 DIAGNOSIS — M5442 Lumbago with sciatica, left side: Secondary | ICD-10-CM | POA: Diagnosis not present

## 2021-05-24 DIAGNOSIS — M5451 Vertebrogenic low back pain: Secondary | ICD-10-CM | POA: Diagnosis not present

## 2021-05-25 ENCOUNTER — Ambulatory Visit: Payer: Medicare PPO | Admitting: Pulmonary Disease

## 2021-05-25 ENCOUNTER — Encounter: Payer: Self-pay | Admitting: Pulmonary Disease

## 2021-05-25 ENCOUNTER — Other Ambulatory Visit: Payer: Self-pay

## 2021-05-25 VITALS — BP 110/70 | HR 69 | Temp 98.0°F | Ht 61.0 in | Wt 153.8 lb

## 2021-05-25 DIAGNOSIS — R058 Other specified cough: Secondary | ICD-10-CM

## 2021-05-25 DIAGNOSIS — K219 Gastro-esophageal reflux disease without esophagitis: Secondary | ICD-10-CM

## 2021-05-25 DIAGNOSIS — R059 Cough, unspecified: Secondary | ICD-10-CM

## 2021-05-25 DIAGNOSIS — K449 Diaphragmatic hernia without obstruction or gangrene: Secondary | ICD-10-CM

## 2021-05-25 DIAGNOSIS — J683 Other acute and subacute respiratory conditions due to chemicals, gases, fumes and vapors: Secondary | ICD-10-CM

## 2021-05-25 NOTE — Progress Notes (Signed)
Subjective:    Patient ID: Sandra Hendrix, female    DOB: 06-30-1946, 75 y.o.   MRN: [id number] Chief Complaint  Patient presents with   Follow-up    Cough is much better.    [name] is a 75 year old lifelong never smoker who carries a diagnosis of asthma who presents for follow-up of a chronic cough of over 2 years duration.  She was initially evaluated here on 02 March 2021.  The patient states that approximately 10 years ago she was diagnosed with asthma symptoms were mostly cough, occasional sputum production that occasionally changes from white to light green/yellow.  It appears that she was evaluated by Dr. Shan Levans around 2010.  She does note issues with dysphagia and significant gastroesophageal reflux symptoms.  She has a known hiatal hernia.  She was started on Flovent prior to her initial visit and noted that this had helped her with her cough.  She does not note any orthopnea, paroxysmal nocturnal dyspnea but does note occasional lower extremity edema.   She has a significant hiatal hernia and gastroesophageal reflux she has been placed on proton pump inhibitor and has been advised on antireflux measures this has also help with the cough.  She underwent PFTs on 11 March 2021 that were essentially normal with the exception of some mild small airways component.   Review of Systems A 10 point review of systems was performed and it is as noted above otherwise negative.    Objective:   Physical Exam BP 110/70 (BP Location: Left Arm, Patient Position: Sitting, Cuff Size: Normal)   Pulse 69   Temp 98 F (36.7 C) (Oral)   Ht 5\' 1"  (1.549 m)   Wt 153 lb 12.8 oz (69.8 kg)   SpO2 97%   BMI 29.06 kg/m   SpO2: 97 % O2 Device: None (Room air)  GENERAL: Developed somewhat overweight woman, no acute distress. Fully ambulatory. No conversational dyspnea. HEAD: Normocephalic, atraumatic.  EYES: Pupils equal, round, reactive to light. No scleral icterus.  MOUTH:  Nose/mouth/throat not examined due to masking requirements for COVID 19. NECK: Supple. No thyromegaly. Trachea midline. No JVD. No adenopathy. PULMONARY: Good air entry bilaterally. No adventitious sounds. CARDIOVASCULAR: S1 and S2. Regular rate and rhythm. No rubs, murmurs or gallops heard. ABDOMEN: Benign. MUSCULOSKELETAL: No joint deformity, no clubbing, no edema.  NEUROLOGIC: No focal deficit, no gait disturbance, speech is fluent. SKIN: Intact,warm,dry. PSYCH: Mood and behavior normal.     Assessment & Plan:     ICD-10-CM   1. Cough  R05.9    Markedly improved on Flovent Continue Flovent for now    2. Reactive airways dysfunction syndrome (HCC)  J68.3    Continue Flovent Continue as needed albuterol May do trial off of Flovent once current inhaler completed    3. Hiatal hernia with GERD  K21.9    K44.9    Continue antireflux measures Continue PPI May require repair if symptoms do not respond      Will see the patient in follow-up in 2 to 3 months time she is to contact us prior to that time should any new problems arise.  Gailen Shelter, MD Advanced [facility]choscopy P[facility]    *This note was generated using voice recognition software/Dragon and/or AI transcription program.  Despite best efforts to proofread, errors can occur which can change the meaning. Any transcriptional errors that result from this process are unintentional and may not be fully corrected at the time of  dictation.

## 2021-05-25 NOTE — Patient Instructions (Signed)
Continue using Flovent twice a day for another month.  Then you may decrease to once daily.  Let us know if your cough gets worse when decreasing the medication to once daily.  We will see her in follow-up in 2 to 3 months time call sooner should any new problems arise.  You may see me or the nurse practitioner at that time.

## 2021-05-26 DIAGNOSIS — M9903 Segmental and somatic dysfunction of lumbar region: Secondary | ICD-10-CM | POA: Diagnosis not present

## 2021-05-26 DIAGNOSIS — M9904 Segmental and somatic dysfunction of sacral region: Secondary | ICD-10-CM | POA: Diagnosis not present

## 2021-05-26 DIAGNOSIS — M5442 Lumbago with sciatica, left side: Secondary | ICD-10-CM | POA: Diagnosis not present

## 2021-05-26 DIAGNOSIS — M5137 Other intervertebral disc degeneration, lumbosacral region: Secondary | ICD-10-CM | POA: Diagnosis not present

## 2021-05-26 DIAGNOSIS — M5451 Vertebrogenic low back pain: Secondary | ICD-10-CM | POA: Diagnosis not present

## 2021-05-26 DIAGNOSIS — M7918 Myalgia, other site: Secondary | ICD-10-CM | POA: Diagnosis not present

## 2021-05-27 ENCOUNTER — Encounter: Payer: Medicare PPO | Admitting: Obstetrics and Gynecology

## 2021-05-27 DIAGNOSIS — M9904 Segmental and somatic dysfunction of sacral region: Secondary | ICD-10-CM | POA: Diagnosis not present

## 2021-05-27 DIAGNOSIS — M5451 Vertebrogenic low back pain: Secondary | ICD-10-CM | POA: Diagnosis not present

## 2021-05-27 DIAGNOSIS — M9903 Segmental and somatic dysfunction of lumbar region: Secondary | ICD-10-CM | POA: Diagnosis not present

## 2021-05-27 DIAGNOSIS — M7918 Myalgia, other site: Secondary | ICD-10-CM | POA: Diagnosis not present

## 2021-05-27 DIAGNOSIS — M5442 Lumbago with sciatica, left side: Secondary | ICD-10-CM | POA: Diagnosis not present

## 2021-06-01 DIAGNOSIS — M5136 Other intervertebral disc degeneration, lumbar region: Secondary | ICD-10-CM | POA: Diagnosis not present

## 2021-06-01 DIAGNOSIS — M9903 Segmental and somatic dysfunction of lumbar region: Secondary | ICD-10-CM | POA: Diagnosis not present

## 2021-06-01 DIAGNOSIS — M5137 Other intervertebral disc degeneration, lumbosacral region: Secondary | ICD-10-CM | POA: Diagnosis not present

## 2021-06-01 DIAGNOSIS — K449 Diaphragmatic hernia without obstruction or gangrene: Secondary | ICD-10-CM | POA: Diagnosis not present

## 2021-06-01 DIAGNOSIS — K219 Gastro-esophageal reflux disease without esophagitis: Secondary | ICD-10-CM | POA: Diagnosis not present

## 2021-06-01 DIAGNOSIS — M9904 Segmental and somatic dysfunction of sacral region: Secondary | ICD-10-CM | POA: Diagnosis not present

## 2021-06-01 DIAGNOSIS — M7918 Myalgia, other site: Secondary | ICD-10-CM | POA: Diagnosis not present

## 2021-06-01 DIAGNOSIS — M5442 Lumbago with sciatica, left side: Secondary | ICD-10-CM | POA: Diagnosis not present

## 2021-06-01 DIAGNOSIS — M5451 Vertebrogenic low back pain: Secondary | ICD-10-CM | POA: Diagnosis not present

## 2021-06-02 DIAGNOSIS — M9904 Segmental and somatic dysfunction of sacral region: Secondary | ICD-10-CM | POA: Diagnosis not present

## 2021-06-02 DIAGNOSIS — M5442 Lumbago with sciatica, left side: Secondary | ICD-10-CM | POA: Diagnosis not present

## 2021-06-02 DIAGNOSIS — M9903 Segmental and somatic dysfunction of lumbar region: Secondary | ICD-10-CM | POA: Diagnosis not present

## 2021-06-02 DIAGNOSIS — M5451 Vertebrogenic low back pain: Secondary | ICD-10-CM | POA: Diagnosis not present

## 2021-06-15 DIAGNOSIS — M7918 Myalgia, other site: Secondary | ICD-10-CM | POA: Diagnosis not present

## 2021-06-15 DIAGNOSIS — M5136 Other intervertebral disc degeneration, lumbar region: Secondary | ICD-10-CM | POA: Diagnosis not present

## 2021-06-15 DIAGNOSIS — M5451 Vertebrogenic low back pain: Secondary | ICD-10-CM | POA: Diagnosis not present

## 2021-06-15 DIAGNOSIS — M9904 Segmental and somatic dysfunction of sacral region: Secondary | ICD-10-CM | POA: Diagnosis not present

## 2021-06-15 DIAGNOSIS — M5442 Lumbago with sciatica, left side: Secondary | ICD-10-CM | POA: Diagnosis not present

## 2021-06-15 DIAGNOSIS — M5137 Other intervertebral disc degeneration, lumbosacral region: Secondary | ICD-10-CM | POA: Diagnosis not present

## 2021-06-15 DIAGNOSIS — M9903 Segmental and somatic dysfunction of lumbar region: Secondary | ICD-10-CM | POA: Diagnosis not present

## 2021-06-17 ENCOUNTER — Other Ambulatory Visit: Payer: Self-pay | Admitting: Internal Medicine

## 2021-06-17 DIAGNOSIS — M5451 Vertebrogenic low back pain: Secondary | ICD-10-CM | POA: Diagnosis not present

## 2021-06-17 DIAGNOSIS — M9903 Segmental and somatic dysfunction of lumbar region: Secondary | ICD-10-CM | POA: Diagnosis not present

## 2021-06-17 DIAGNOSIS — M7918 Myalgia, other site: Secondary | ICD-10-CM | POA: Diagnosis not present

## 2021-06-17 DIAGNOSIS — M5136 Other intervertebral disc degeneration, lumbar region: Secondary | ICD-10-CM | POA: Diagnosis not present

## 2021-06-17 DIAGNOSIS — M5442 Lumbago with sciatica, left side: Secondary | ICD-10-CM | POA: Diagnosis not present

## 2021-06-17 DIAGNOSIS — M5137 Other intervertebral disc degeneration, lumbosacral region: Secondary | ICD-10-CM | POA: Diagnosis not present

## 2021-06-17 DIAGNOSIS — M9904 Segmental and somatic dysfunction of sacral region: Secondary | ICD-10-CM | POA: Diagnosis not present

## 2021-06-21 DIAGNOSIS — M5451 Vertebrogenic low back pain: Secondary | ICD-10-CM | POA: Diagnosis not present

## 2021-06-21 DIAGNOSIS — M5136 Other intervertebral disc degeneration, lumbar region: Secondary | ICD-10-CM | POA: Diagnosis not present

## 2021-06-21 DIAGNOSIS — M5137 Other intervertebral disc degeneration, lumbosacral region: Secondary | ICD-10-CM | POA: Diagnosis not present

## 2021-06-21 DIAGNOSIS — M7918 Myalgia, other site: Secondary | ICD-10-CM | POA: Diagnosis not present

## 2021-06-21 DIAGNOSIS — M5442 Lumbago with sciatica, left side: Secondary | ICD-10-CM | POA: Diagnosis not present

## 2021-06-21 DIAGNOSIS — M9904 Segmental and somatic dysfunction of sacral region: Secondary | ICD-10-CM | POA: Diagnosis not present

## 2021-06-21 DIAGNOSIS — M9903 Segmental and somatic dysfunction of lumbar region: Secondary | ICD-10-CM | POA: Diagnosis not present

## 2021-06-23 ENCOUNTER — Ambulatory Visit: Payer: Medicare PPO | Admitting: Internal Medicine

## 2021-07-06 DIAGNOSIS — G4733 Obstructive sleep apnea (adult) (pediatric): Secondary | ICD-10-CM

## 2021-07-08 ENCOUNTER — Telehealth: Payer: Self-pay | Admitting: Internal Medicine

## 2021-07-08 NOTE — Telephone Encounter (Signed)
LMTCB

## 2021-07-08 NOTE — Telephone Encounter (Signed)
Spoke with pt to let her know that she will need to call her insurance company to see they cover another supply company for her to use. She was advised to give [place] a call back when she finds out.

## 2021-07-08 NOTE — Telephone Encounter (Signed)
Patient calling in for refill of her Cpap supplies. Patient currently uses the [facility] but states she is not happy with them.   Patient would like to be referred to another company.   Please advise

## 2021-07-08 NOTE — Telephone Encounter (Signed)
Patient calling back in

## 2021-07-12 ENCOUNTER — Other Ambulatory Visit: Payer: Self-pay | Admitting: Internal Medicine

## 2021-07-12 MED ORDER — FUROSEMIDE 20 MG PO TABS: 20.0000 mg | ORAL_TABLET | ORAL | 3 refills | Status: DC

## 2021-07-19 NOTE — Telephone Encounter (Signed)
Patient is calling in to see why her prescription hasnt been sent in from the message below.She stated the name of her insurance to use for the prescription is [employer] their number is [phone] and the cpap company is [id number].

## 2021-07-20 ENCOUNTER — Encounter: Payer: Self-pay | Admitting: Pulmonary Disease

## 2021-07-20 DIAGNOSIS — G4733 Obstructive sleep apnea (adult) (pediatric): Secondary | ICD-10-CM | POA: Diagnosis not present

## 2021-07-21 ENCOUNTER — Telehealth: Payer: Self-pay | Admitting: Internal Medicine

## 2021-07-21 ENCOUNTER — Other Ambulatory Visit: Payer: Self-pay

## 2021-07-21 ENCOUNTER — Encounter: Payer: Self-pay | Admitting: Internal Medicine

## 2021-07-21 ENCOUNTER — Telehealth (INDEPENDENT_AMBULATORY_CARE_PROVIDER_SITE_OTHER): Payer: Medicare PPO | Admitting: Internal Medicine

## 2021-07-21 ENCOUNTER — Telehealth: Payer: Self-pay | Admitting: Pharmacist

## 2021-07-21 DIAGNOSIS — U071 COVID-19: Secondary | ICD-10-CM

## 2021-07-21 HISTORY — DX: COVID-19: U07.1

## 2021-07-21 MED ORDER — MOLNUPIRAVIR EUA 200MG CAPSULE: 4.0000 | ORAL_CAPSULE | Freq: Two times a day (BID) | ORAL | 0 refills | Status: AC

## 2021-07-21 MED ORDER — PREDNISONE 10 MG PO TABS: ORAL_TABLET | ORAL | 0 refills | Status: DC

## 2021-07-21 NOTE — Telephone Encounter (Signed)
Pt called in stating she is still having Covid symptoms. Pt is having high fevers , headaches, body aches, and cough. Pt would like for Dr. Derrel Nip to prescribe her something so she can feel better.

## 2021-07-21 NOTE — Telephone Encounter (Signed)
Outpatient Pharmacy Oral COVID Treatment Note  I connected with Sandra Hendrix on 07/21/2021/10:46 AM by telephone and verified that I am speaking with the correct person using two identifiers.  I discussed the limitations, risks, security, and privacy concerns of performing an evaluation and management service by telephone and the availability of in person appointments via referral to a physician. The patient expressed understanding and agreed to proceed.  Pharmacy location: [facility]  Diagnosis: COVID-19 infection  Purpose of visit: Discussion of potential use of Paxlovid, a new treatment for mild to moderate COVID-19 viral infection in non-hospitalized patients.  Subjective/Objective: Patient is a 75 y.o. female who is presenting with COVID 19 viral infection.  COVID 19 viral infection. Their symptoms began on 07/19/21 with sore throat and a cough.  The patient has confirmed COVID-19 via a positive test on 07/20/21.  Assessment/Plan:  This patient is a 75 y.o. female that does not meets the criteria for Emergency Use Authorization of Paxlovid due to drug interactions and was advised to call and set up a telemedicine visit for further evaluation with a provider. Patient would qualify for Molnupiravir.    Past Medical History:  Diagnosis Date   Allergy    Anxiety    Arthritis    Barrett's esophagus determined by endoscopy    Cancer ([place])    Uterine Cancer   Chronic kidney disease    Chronic venous insufficiency    Depression    Diabetes mellitus without complication ([place])    Difficult intubation    "not sure what happened,asleep was told had problem with intubation"   Diverticulosis    Fatty liver    GERD (gastroesophageal reflux disease)    History of bronchitis    History of hiatal hernia    Hyperlipidemia    Hypertension    Irritable bowel syndrome with constipation    Sleep apnea    Use C- PAP   Urinary incontinence    Varicose veins      Allergies   Allergen Reactions   Clindamycin/Lincomycin Rash   Codeine Rash   Lincomycin Rash   Morphine And Related Nausea Only   Talwin [Pentazocine] Nausea And Vomiting   Lisinopril Cough    cough   Naloxone Other (See Comments)    Had issues with sedation and vomiting with EGD   Oxycodone-Acetaminophen Other (See Comments)    Pt states this medication makes her bowels stop moving   Warfarin And Related Nausea Only     Current Outpatient Medications:    acetaminophen (TYLENOL) 500 MG tablet, Take 2 tablets (1,000 mg total) by mouth every 6 (six) hours. (Patient taking differently: Take 1,000 mg by mouth every 6 (six) hours as needed.), Disp: 65 tablet, Rfl: 1   albuterol (PROVENTIL HFA;VENTOLIN HFA) 108 (90 BASE) MCG/ACT inhaler, Inhale 2 puffs into the lungs every 6 (six) hours as needed for wheezing or shortness of breath. , Disp: , Rfl:    Ascorbic Acid 500 MG CHEW, Chew by mouth., Disp: , Rfl:    aspirin EC 81 MG tablet, Take 81 mg by mouth every evening. , Disp: , Rfl:    atorvastatin (LIPITOR) 40 MG tablet, TAKE 1/2 TABLET EVERY DAY AT 6 PM, Disp: 45 tablet, Rfl: 1   azelastine (ASTELIN) 0.1 % nasal spray, Place 1 spray into both nostrils daily., Disp: , Rfl:    benzonatate (TESSALON) 100 MG capsule, Take 1 capsule (100 mg total) by mouth 3 (three) times daily as needed for cough., Disp:  21 capsule, Rfl: 0   Biotin 5000 MCG TABS, Take by mouth 2 (two) times daily. , Disp: , Rfl:    blood glucose meter kit and supplies, Use to check blood sugar twice daily. ICD 10: E11.29, Disp: 1 each, Rfl: 0   Cetirizine HCl 10 MG CAPS, Take by mouth., Disp: , Rfl:    Cinnamon Bark POWD, Take by mouth., Disp: , Rfl:    COLLAGEN PO, Take by mouth 2 (two) times daily. For improved fingernail strength, Disp: , Rfl:    docusate sodium (COLACE) 100 MG capsule, Take 1 capsule (100 mg total) by mouth 2 (two) times daily., Disp: 20 capsule, Rfl: 0   fluticasone (FLONASE) 50 MCG/ACT nasal spray, Place 1 spray  into both nostrils daily., Disp: 48 g, Rfl: 1   fluticasone (FLOVENT DISKUS) 50 MCG/BLIST diskus inhaler, Inhale 1 puff into the lungs 2 (two) times daily., Disp: , Rfl:    furosemide (LASIX) 20 MG tablet, Take 1 tablet (20 mg total) by mouth every other day., Disp: 45 tablet, Rfl: 3   glucose blood (ACCU-CHEK GUIDE) test strip, USE 1 STRIP TO TEST BLOOD SUGARS  TWICE DAILY, Disp: 50 each, Rfl: 0   insulin detemir (LEVEMIR) 100 UNIT/ML injection, INJECT 18 UNITS SUBCUTANEOUSLY AT BEDTIME. DISCARD VIAL 42 DAYS AFTER OPENING, Disp: 10 mL, Rfl: 2   Lancet Devices (ACCU-CHEK SOFTCLIX) lancets, Use to test blood sugars 1 -2 times daily, Disp: 1 each, Rfl: 3   loratadine (CLARITIN) 10 MG tablet, Take 10 mg by mouth daily., Disp: , Rfl:    losartan-hydrochlorothiazide (HYZAAR) 50-12.5 MG tablet, TAKE 1 TABLET BY MOUTH  DAILY, Disp: 90 tablet, Rfl: 3   Magnesium 250 MG TABS, Take 1 tablet by mouth daily., Disp: , Rfl:    meclizine (ANTIVERT) 25 MG tablet, Take 1 tablet (25 mg total) by mouth 3 (three) times daily as needed for dizziness., Disp: 30 tablet, Rfl: 3   metFORMIN (GLUCOPHAGE) 500 MG tablet, TAKE 1 TABLET (500 MG TOTAL) BY MOUTH 2 (TWO) TIMES DAILY WITH A MEAL., Disp: 180 tablet, Rfl: 2   Misc Natural Products (NEURIVA PO), Take by mouth in the morning and at bedtime., Disp: , Rfl:    Multiple Vitamin (MULTIVITAMIN) tablet, Take 1 tablet by mouth daily., Disp: , Rfl:    omega-3 acid ethyl esters (LOVAZA) 1 g capsule, Take 1 g by mouth daily., Disp: , Rfl:    oxybutynin (DITROPAN-XL) 10 MG 24 hr tablet, Take 1 tablet (10 mg total) by mouth daily., Disp: 90 tablet, Rfl: 3   pantoprazole (PROTONIX) 40 MG tablet, Take 1 tablet by mouth twice daily, Disp: 180 tablet, Rfl: 1   Probiotic Product (PROBIOTIC-10) CAPS, Take 1 capsule by mouth daily. , Disp: , Rfl:    Semaglutide,0.25 or 0.5MG/DOS, (OZEMPIC, 0.25 OR 0.5 MG/DOSE,) 2 MG/1.5ML SOPN, Inject 0.5 mg into the skin once a week., Disp: 4.5 mL, Rfl:  5   sertraline (ZOLOFT) 50 MG tablet, TAKE 1 TABLET EVERY DAY, Disp: 90 tablet, Rfl: 3   tiZANidine (ZANAFLEX) 4 MG tablet, tizanidine 4 mg tablet, Disp: , Rfl:    verapamil (VERELAN PM) 180 MG 24 hr capsule, TAKE 1 CAPSULE EVERY DAY, Disp: 90 capsule, Rfl: 3   vitamin B-12 (CYANOCOBALAMIN) 1000 MCG tablet, Take 1,000 mcg by mouth daily., Disp: , Rfl:    Zinc 50 MG TABS, Take by mouth daily., Disp: , Rfl:   Lab Monitoring: eGFR 58, renal dosing indicated  Drug Interactions Noted: Flonase Nasal Spray  is contraindicated, and the combination should not be used. Flovent Inhaler is not recommended during Paxlovid treatment due to increased serum concentrations of fluticasone with potential HPA axis suppression. Lipitor is not commended during [employer] treatment.  Plan:  This patient is a 75 y.o. female that does not meets the criteria for Emergency Use Authorization of Paxlovid due to drug interactions and was advised to call and set up a telemedicine visit for further evaluation with a provider. Patient would qualify for Molnupiravir.   Letta Median 07/21/2021, 10:46 AM [facility] Outpatient Pharmacist Phone# [phone]

## 2021-07-21 NOTE — Progress Notes (Signed)
Virtual Visit via CAregility Note   This visit type was conducted due to national recommendations for restrictions regarding the COVID-19 pandemic (e.g. social distancing).  This format is felt to be most appropriate for this patient at this time.  All issues noted in this document were discussed and addressed.  No physical exam was performed (except for noted visual exam findings with Video Visits).   I connected withNAME@ on 07/21/21 at  2:00 PM EDT by a video enabled telemedicine application  and verified that I am speaking with the correct person using two identifiers. Location patient: home Location provider: work or home office Persons participating in the virtual visit: patient, provider  I discussed the limitations, risks, security and privacy concerns of performing an evaluation and management service by telephone and the availability of in person appointments. I also discussed with the patient that there may be a patient responsible charge related to this service. The patient expressed understanding and agreed to proceed.  Reason for visit: ACUTE COVID INFECTION   HPI:  POSITIVE TEST OCT 25 SYMPTOMS STARTED OCT 24  with cough, headache,  sinus congestion and body aches.  Denies dyspnea,  hypoxia.  Has had occasional wheezing after couhing spells.   ROS: See pertinent positives and negatives per HPI.  Past Medical History:  Diagnosis Date   Allergy    Anxiety    Arthritis    Barrett's esophagus determined by endoscopy    Cancer ([place])    Uterine Cancer   Chronic kidney disease    Chronic venous insufficiency    Depression    Diabetes mellitus without complication ([place])    Difficult intubation    "not sure what happened,asleep was told had problem with intubation"   Diverticulosis    Fatty liver    GERD (gastroesophageal reflux disease)    History of bronchitis    History of hiatal hernia    Hyperlipidemia    Hypertension    Irritable bowel syndrome with constipation     Sleep apnea    Use C- PAP   Urinary incontinence    Varicose veins     Past Surgical History:  Procedure Laterality Date   CARDIAC CATHETERIZATION     CHOLECYSTECTOMY     COLONOSCOPY WITH PROPOFOL N/A 06/14/2016   Procedure: COLONOSCOPY WITH PROPOFOL;  Surgeon: Lollie Sails, MD;  Location: [facility] ENDOSCOPY;  Service: Endoscopy;  Laterality: N/A;   COLONOSCOPY WITH PROPOFOL N/A 12/18/2020   Procedure: COLONOSCOPY WITH PROPOFOL;  Surgeon: Lesly Rubenstein, MD;  Location: [facility] ENDOSCOPY;  Service: Endoscopy;  Laterality: N/A;   ESOPHAGOGASTRODUODENOSCOPY N/A 12/18/2020   Procedure: ESOPHAGOGASTRODUODENOSCOPY (EGD);  Surgeon: Lesly Rubenstein, MD;  Location: [facility] ENDOSCOPY;  Service: Endoscopy;  Laterality: N/A;   ESOPHAGOGASTRODUODENOSCOPY (EGD) WITH PROPOFOL N/A 06/14/2016   Procedure: ESOPHAGOGASTRODUODENOSCOPY (EGD) WITH PROPOFOL;  Surgeon: Lollie Sails, MD;  Location[facility] ENDOSCOPY;  Service: Endoscopy;  Laterality: N/A;   ESOPHAGOGASTRODUODENOSCOPY (EGD) WITH PROPOFOL N/A 06/12/2017   Procedure: ESOPHAGOGASTRODUODENOSCOPY (EGD) WITH PROPOFOL;  Surgeon: Lollie Sails, MD;  Location: [facility] ENDOSCOPY;  Service: Endoscopy;  Laterality: N/A;   ESOPHAGOGASTRODUODENOSCOPY (EGD) WITH PROPOFOL N/A 08/25/2017   Procedure: ESOPHAGOGASTRODUODENOSCOPY (EGD) WITH PROPOFOL;  Surgeon: Lollie Sails, MD;  Location: [facility] ENDOSCOPY;  Service: Endoscopy;  Laterality: N/A;   HYSTEROSCOPY WITH D & C N/A 01/02/2017   Procedure: DILATATION AND CURETTAGE /HYSTEROSCOPY;  Surgeon: Brayton Mars, MD;  Location: [facility] ORS;  Service: Gynecology;  Laterality: N/A;   JOINT REPLACEMENT Right 2007  Total Knee Replacement   JOINT REPLACEMENT Left 2004   Total Knee Replacement   LAPAROSCOPIC HYSTERECTOMY Bilateral 03/01/2017   Procedure: HYSTERECTOMY TOTAL LAPAROSCOPIC BSO;  Surgeon: Mellody Drown, MD;  Location: [facility] ORS;  Service: Gynecology;  Laterality: Bilateral;   ROTATOR CUFF REPAIR  Right    SENTINEL NODE BIOPSY N/A 03/01/2017   Procedure: SENTINEL NODE INJECTION AND BIOPSY;  Surgeon: Mellody Drown, MD;  Location: [facility] ORS;  Service: Gynecology;  Laterality: N/A;   TONSILLECTOMY     as a child   TUBAL LIGATION     UPPER GI ENDOSCOPY     x2    Family History  Problem Relation Age of Onset   Stroke Mother    Hypertension Mother    Lupus Mother    Alcohol abuse Father    Diabetes Father    Cancer Father        gallbladder ca into liver    Arthritis Sister    Diabetes Sister    Lumbar disc disease Sister    Hyperlipidemia Daughter    Hypertension Daughter    Cancer Maternal Aunt        ovarian ca   Diabetes Paternal Aunt    Drug abuse Paternal Aunt    Cancer Maternal Grandfather        colon ca   Hypertension Brother    Alcohol abuse Brother    Stroke Brother     SOCIAL HX:  reports that she has never smoked. She has never used smokeless tobacco. She reports that she does not currently use alcohol. She reports that she does not use drugs.    Current Outpatient Medications:    acetaminophen (TYLENOL) 500 MG tablet, Take 2 tablets (1,000 mg total) by mouth every 6 (six) hours. (Patient taking differently: Take 1,000 mg by mouth every 6 (six) hours as needed.), Disp: 65 tablet, Rfl: 1   albuterol (PROVENTIL HFA;VENTOLIN HFA) 108 (90 BASE) MCG/ACT inhaler, Inhale 2 puffs into the lungs every 6 (six) hours as needed for wheezing or shortness of breath. , Disp: , Rfl:    Ascorbic Acid 500 MG CHEW, Chew by mouth., Disp: , Rfl:    atorvastatin (LIPITOR) 40 MG tablet, TAKE 1/2 TABLET EVERY DAY AT 6 PM, Disp: 45 tablet, Rfl: 1   azelastine (ASTELIN) 0.1 % nasal spray, Place 1 spray into both nostrils daily., Disp: , Rfl:    Biotin 5000 MCG TABS, Take by mouth 2 (two) times daily. , Disp: , Rfl:    blood glucose meter kit and supplies, Use to check blood sugar twice daily. ICD 10: E11.29, Disp: 1 each, Rfl: 0   Cetirizine HCl 10 MG CAPS, Take by mouth., Disp: ,  Rfl:    Cinnamon Bark POWD, Take by mouth., Disp: , Rfl:    COLLAGEN PO, Take by mouth 2 (two) times daily. For improved fingernail strength, Disp: , Rfl:    docusate sodium (COLACE) 100 MG capsule, Take 1 capsule (100 mg total) by mouth 2 (two) times daily., Disp: 20 capsule, Rfl: 0   fluticasone (FLONASE) 50 MCG/ACT nasal spray, Place 1 spray into both nostrils daily., Disp: 48 g, Rfl: 1   fluticasone (FLOVENT DISKUS) 50 MCG/BLIST diskus inhaler, Inhale 1 puff into the lungs 2 (two) times daily., Disp: , Rfl:    furosemide (LASIX) 20 MG tablet, Take 1 tablet (20 mg total) by mouth every other day., Disp: 45 tablet, Rfl: 3   glucose blood (ACCU-CHEK GUIDE) test strip, USE 1 STRIP TO  TEST BLOOD SUGARS  TWICE DAILY, Disp: 50 each, Rfl: 0   insulin detemir (LEVEMIR) 100 UNIT/ML injection, INJECT 18 UNITS SUBCUTANEOUSLY AT BEDTIME. DISCARD VIAL 42 DAYS AFTER OPENING, Disp: 10 mL, Rfl: 2   Lancet Devices (ACCU-CHEK SOFTCLIX) lancets, Use to test blood sugars 1 -2 times daily, Disp: 1 each, Rfl: 3   levocetirizine (XYZAL) 5 MG tablet, Take 5 mg by mouth every evening., Disp: , Rfl:    losartan-hydrochlorothiazide (HYZAAR) 50-12.5 MG tablet, TAKE 1 TABLET BY MOUTH  DAILY, Disp: 90 tablet, Rfl: 3   Magnesium 250 MG TABS, Take 1 tablet by mouth daily., Disp: , Rfl:    meclizine (ANTIVERT) 25 MG tablet, Take 1 tablet (25 mg total) by mouth 3 (three) times daily as needed for dizziness., Disp: 30 tablet, Rfl: 3   metFORMIN (GLUCOPHAGE) 500 MG tablet, TAKE 1 TABLET (500 MG TOTAL) BY MOUTH 2 (TWO) TIMES DAILY WITH A MEAL., Disp: 180 tablet, Rfl: 2   molnupiravir EUA (LAGEVRIO) 200 mg CAPS capsule, Take 4 capsules (800 mg total) by mouth 2 (two) times daily for 5 days., Disp: 40 capsule, Rfl: 0   Multiple Vitamin (MULTIVITAMIN) tablet, Take 1 tablet by mouth daily., Disp: , Rfl:    oxybutynin (DITROPAN-XL) 10 MG 24 hr tablet, Take 1 tablet (10 mg total) by mouth daily., Disp: 90 tablet, Rfl: 3   predniSONE  (DELTASONE) 10 MG tablet, 6 tablets on Day 1 , then reduce by 1 tablet daily until gone, Disp: 21 tablet, Rfl: 0   Probiotic Product (PROBIOTIC-10) CAPS, Take 1 capsule by mouth daily. , Disp: , Rfl:    Semaglutide,0.25 or 0.5MG /DOS, (OZEMPIC, 0.25 OR 0.5 MG/DOSE,) 2 MG/1.5ML SOPN, Inject 0.5 mg into the skin once a week., Disp: 4.5 mL, Rfl: 5   sertraline (ZOLOFT) 50 MG tablet, TAKE 1 TABLET EVERY DAY, Disp: 90 tablet, Rfl: 3   tiZANidine (ZANAFLEX) 4 MG tablet, tizanidine 4 mg tablet, Disp: , Rfl:    verapamil (VERELAN PM) 180 MG 24 hr capsule, TAKE 1 CAPSULE EVERY DAY, Disp: 90 capsule, Rfl: 3   vitamin B-12 (CYANOCOBALAMIN) 1000 MCG tablet, Take 1,000 mcg by mouth daily., Disp: , Rfl:    Zinc 50 MG TABS, Take by mouth daily., Disp: , Rfl:    benzonatate (TESSALON) 100 MG capsule, Take 1 capsule (100 mg total) by mouth 3 (three) times daily as needed for cough. (Patient not taking: Reported on 07/21/2021), Disp: 21 capsule, Rfl: 0   pantoprazole (PROTONIX) 40 MG tablet, Take 1 tablet by mouth twice daily, Disp: 180 tablet, Rfl: 0  EXAM:  VITALS per patient if applicable:  GENERAL: alert, oriented, appears well and in no acute distress  HEENT: atraumatic, conjunttiva clear, no obvious abnormalities on inspection of external nose and ears  NECK: normal movements of the head and neck  LUNGS: on inspection no signs of respiratory distress, breathing rate appears normal, no obvious gross SOB, gasping or wheezing  CV: no obvious cyanosis  MS: moves all visible extremities without noticeable abnormality  PSYCH/NEURO: pleasant and cooperative, no obvious depression or anxiety, speech and thought processing grossly intact  ASSESSMENT AND PLAN:  Discussed the following assessment and plan:  COVID-19 virus infection  COVID-19 virus infection molnupiravir sent on oct 26.  Adding prednisone taper, cough suppressants.  Patients should remain in quarantine for a minium of 5 days .  Advised to  Get plenty of rest and push fluidsUse OTC zyrtec for  runny nose, and/or sore throat Sudafed PE or  Afrin nasal spray for congestion (or Zyrtec D Delma Freeze D) Use medications daily for symptom relief Use OTC medications limited to  tylenol as needed fever or pain and advised to all or go to the ED if new or worsening symptoms such as fever, worsening cough, shortness of breath, chest tightness, chest pain, turning blue, changes in mental status.    I discussed the assessment and treatment plan with the patient. The patient was provided an opportunity to ask questions and all were answered. The patient agreed with the plan and demonstrated an understanding of the instructions.   The patient was advised to call back or seek an in-person evaluation if the symptoms worsen or if the condition fails to improve as anticipated.   I spent 22 minutes dedicated to the care of this patient on the date of this encounter to include pre-visit review of her medical history,  Face-to-face time with the patient , and post visit ordering of testing and therapeutics.    Crecencio Mc, MD

## 2021-07-21 NOTE — Telephone Encounter (Signed)
Pt has been scheduled for a virtual today at 2pm. Positive covid.

## 2021-07-21 NOTE — Telephone Encounter (Signed)
Pt is aware that medication has been sent in.  

## 2021-07-21 NOTE — Patient Instructions (Signed)
START THE MOLNUPIRAVIR TONIGHT AND THE PREDNISONE TOMORROW   PICK UP ROBITUSSIN DM FOR THE COUGH (OR ANY GENERIC SYRUP CONTAINING DEXTROMETHORPHAN)

## 2021-07-22 NOTE — Telephone Encounter (Signed)
Spoke with pt to let her know that I spoke with [facility] and they stated that the pt would need to call to request to supplies. Pt stated that she had called them and they should be shipping it out. Pt was advised that if she does not receive in the next couple of days to please give them a call to check on the status. Pt gave a verbal understanding.

## 2021-07-23 ENCOUNTER — Ambulatory Visit: Payer: Medicare PPO | Admitting: Internal Medicine

## 2021-07-23 ENCOUNTER — Telehealth: Payer: Self-pay

## 2021-07-23 ENCOUNTER — Other Ambulatory Visit: Payer: Self-pay

## 2021-07-23 ENCOUNTER — Other Ambulatory Visit: Payer: Self-pay | Admitting: Internal Medicine

## 2021-07-23 NOTE — Addendum Note (Signed)
Addended by: Crecencio Mc on: 07/23/2021 06:03 AM   Modules accepted: Orders

## 2021-07-23 NOTE — Telephone Encounter (Signed)
The DME for CPAP supplies has been ordered .  Pleaes put in folder for signing and send to the facility she referenced in my cart message

## 2021-07-23 NOTE — Telephone Encounter (Signed)
Pt is aware to continue current regimen.

## 2021-07-23 NOTE — Telephone Encounter (Signed)
Spoke with pt and stated that she took 20 units of insulin last night and this morning her blood sugar was 85 and has been normal all day. She stated that reason it went up yesterday was because she had forgotten to take all her medication.

## 2021-07-24 NOTE — Assessment & Plan Note (Signed)
molnupiravir sent on oct 26.  Adding prednisone taper, cough suppressants.  Patients should remain in quarantine for a minium of 5 days .  Advised to Get plenty of rest and push fluidsUse OTC zyrtec for  runny nose, and/or sore throat Sudafed PE or Afrin nasal spray for congestion (or Zyrtec D Delma Freeze D) Use medications daily for symptom relief Use OTC medications limited to  tylenol as needed fever or pain and advised to all or go to the ED if new or worsening symptoms such as fever, worsening cough, shortness of breath, chest tightness, chest pain, turning blue, changes in mental status.

## 2021-08-12 ENCOUNTER — Ambulatory Visit: Payer: Medicare PPO | Admitting: Internal Medicine

## 2021-08-12 ENCOUNTER — Encounter: Payer: Self-pay | Admitting: Internal Medicine

## 2021-08-12 ENCOUNTER — Other Ambulatory Visit: Payer: Self-pay

## 2021-08-12 VITALS — BP 108/58 | HR 71 | Temp 96.6°F | Ht 61.0 in | Wt 150.0 lb

## 2021-08-12 DIAGNOSIS — E1169 Type 2 diabetes mellitus with other specified complication: Secondary | ICD-10-CM | POA: Diagnosis not present

## 2021-08-12 DIAGNOSIS — E1122 Type 2 diabetes mellitus with diabetic chronic kidney disease: Secondary | ICD-10-CM | POA: Diagnosis not present

## 2021-08-12 DIAGNOSIS — N183 Chronic kidney disease, stage 3 unspecified: Secondary | ICD-10-CM | POA: Diagnosis not present

## 2021-08-12 DIAGNOSIS — Z1231 Encounter for screening mammogram for malignant neoplasm of breast: Secondary | ICD-10-CM

## 2021-08-12 DIAGNOSIS — I129 Hypertensive chronic kidney disease with stage 1 through stage 4 chronic kidney disease, or unspecified chronic kidney disease: Secondary | ICD-10-CM

## 2021-08-12 DIAGNOSIS — E785 Hyperlipidemia, unspecified: Secondary | ICD-10-CM | POA: Diagnosis not present

## 2021-08-12 LAB — COMPREHENSIVE METABOLIC PANEL
ALT: 17 U/L (ref 0–35)
AST: 22 U/L (ref 0–37)
Albumin: 4.1 g/dL (ref 3.5–5.2)
Alkaline Phosphatase: 77 U/L (ref 39–117)
BUN: 20 mg/dL (ref 6–23)
CO2: 30 mEq/L (ref 19–32)
Calcium: 9.3 mg/dL (ref 8.4–10.5)
Chloride: 101 mEq/L (ref 96–112)
Creatinine, Ser: 0.89 mg/dL (ref 0.40–1.20)
GFR: 62.57 mL/min (ref >60.00)
Glucose, Bld: 107 mg/dL — ABNORMAL HIGH (ref 70–99)
Potassium: 3.4 mEq/L — ABNORMAL LOW (ref 3.5–5.1)
Sodium: 139 mEq/L (ref 135–145)
Total Bilirubin: 0.5 mg/dL (ref 0.2–1.2)
Total Protein: 6.7 g/dL (ref 6.0–8.3)

## 2021-08-12 LAB — HEMOGLOBIN A1C: Hgb A1c MFr Bld: 6.7 % — ABNORMAL HIGH (ref 4.6–6.5)

## 2021-08-12 MED ORDER — METFORMIN HCL ER 500 MG PO TB24: 500.0000 mg | ORAL_TABLET | Freq: Every day | ORAL | 1 refills | Status: DC

## 2021-08-12 NOTE — Progress Notes (Signed)
Subjective:  Patient ID: Sandra Hendrix, female    DOB: 07-26-1946  Age: 75 y.o. MRN: [id number]  CC: The primary encounter diagnosis was Type 2 DM with CKD stage 3 and hypertension ([place]). Diagnoses of Encounter for screening mammogram for malignant neoplasm of breast and Hyperlipidemia associated with type 2 diabetes mellitus ([place]) were also pertinent to this visit.  [name] presents for  Chief Complaint  Patient presents with   Follow-up    3 month follow up on diabetes    This visit occurred during the SARS-CoV-2 public health emergency.  Safety protocols were in place, including screening questions prior to the visit, additional usage of staff PPE, and extensive cleaning of exam room while observing appropriate contact time as indicated for disinfecting solutions.   HAD COVID INFECTION IN October .  SUGARS WERE 300 TO 400 DURING ILLNESS BECAUSE SHE STOPPED HER MEDICATIONS (FORGOT to take them )  Blood sugars have been "good"  since then but she can only remember one reading of 170   T2DM:  She  feels generally well,  is not  exercising regularly or trying to lose weight. Checking  blood sugars less than once daily at variable times, usually only if she feels she may be having a hypoglycemic event. .  BS have been under 130 fasting and < 150 post prandially.  FORGETTING to take evening metformin often . Denies any recent hypoglyemic events.  Taking   medications as directed. Following a carbohydrate modified diet 6 days per week. Denies numbness, burning and tingling of extremities. Appetite is good.   Taking 15 units of Levemir , 0.5 mg ozempic with a loss of 10 lbs over the last  3 months .  First meal is usually around 12:30 pm, after exercising    Outpatient Medications Prior to Visit  Medication Sig Dispense Refill   acetaminophen (TYLENOL) 500 MG tablet Take 2 tablets (1,000 mg total) by mouth every 6 (six) hours. (Patient taking differently: Take 1,000 mg by mouth  every 6 (six) hours as needed.) 65 tablet 1   albuterol (PROVENTIL HFA;VENTOLIN HFA) 108 (90 BASE) MCG/ACT inhaler Inhale 2 puffs into the lungs every 6 (six) hours as needed for wheezing or shortness of breath.      Ascorbic Acid 500 MG CHEW Chew by mouth.     atorvastatin (LIPITOR) 40 MG tablet TAKE 1/2 TABLET EVERY DAY AT 6 PM 45 tablet 1   azelastine (ASTELIN) 0.1 % nasal spray Place 1 spray into both nostrils daily.     benzonatate (TESSALON) 100 MG capsule Take 1 capsule (100 mg total) by mouth 3 (three) times daily as needed for cough. 21 capsule 0   Biotin 5000 MCG TABS Take by mouth 2 (two) times daily.      blood glucose meter kit and supplies Use to check blood sugar twice daily. ICD 10: E11.29 1 each 0   Cetirizine HCl 10 MG CAPS Take by mouth.     Cinnamon Bark POWD Take by mouth.     COLLAGEN PO Take by mouth 2 (two) times daily. For improved fingernail strength     docusate sodium (COLACE) 100 MG capsule Take 1 capsule (100 mg total) by mouth 2 (two) times daily. 20 capsule 0   fluticasone (FLONASE) 50 MCG/ACT nasal spray Place 1 spray into both nostrils daily. 48 g 1   fluticasone (FLOVENT DISKUS) 50 MCG/BLIST diskus inhaler Inhale 1 puff into the lungs 2 (two) times daily.  furosemide (LASIX) 20 MG tablet Take 1 tablet (20 mg total) by mouth every other day. 45 tablet 3   glucose blood (ACCU-CHEK GUIDE) test strip USE 1 STRIP TO TEST BLOOD SUGARS  TWICE DAILY 50 each 0   insulin detemir (LEVEMIR) 100 UNIT/ML injection INJECT 18 UNITS SUBCUTANEOUSLY AT BEDTIME. DISCARD VIAL 42 DAYS AFTER OPENING (Patient taking differently: INJECT 15 UNITS SUBCUTANEOUSLY AT BEDTIME. DISCARD VIAL 42 DAYS AFTER OPENING) 10 mL 2   Lancet Devices (ACCU-CHEK SOFTCLIX) lancets Use to test blood sugars 1 -2 times daily 1 each 3   levocetirizine (XYZAL) 5 MG tablet Take 5 mg by mouth every evening.     losartan-hydrochlorothiazide (HYZAAR) 50-12.5 MG tablet TAKE 1 TABLET BY MOUTH  DAILY 90 tablet 3    Magnesium 250 MG TABS Take 1 tablet by mouth daily.     meclizine (ANTIVERT) 25 MG tablet Take 1 tablet (25 mg total) by mouth 3 (three) times daily as needed for dizziness. 30 tablet 3   Multiple Vitamin (MULTIVITAMIN) tablet Take 1 tablet by mouth daily.     oxybutynin (DITROPAN-XL) 10 MG 24 hr tablet Take 1 tablet (10 mg total) by mouth daily. 90 tablet 3   pantoprazole (PROTONIX) 40 MG tablet Take 1 tablet by mouth twice daily 180 tablet 0   Probiotic Product (PROBIOTIC-10) CAPS Take 1 capsule by mouth daily.      Semaglutide,0.25 or 0.5MG/DOS, (OZEMPIC, 0.25 OR 0.5 MG/DOSE,) 2 MG/1.5ML SOPN Inject 0.5 mg into the skin once a week. 4.5 mL 5   sertraline (ZOLOFT) 50 MG tablet TAKE 1 TABLET EVERY DAY 90 tablet 3   tiZANidine (ZANAFLEX) 4 MG tablet tizanidine 4 mg tablet     verapamil (VERELAN PM) 180 MG 24 hr capsule TAKE 1 CAPSULE EVERY DAY 90 capsule 3   vitamin B-12 (CYANOCOBALAMIN) 1000 MCG tablet Take 1,000 mcg by mouth daily.     Zinc 50 MG TABS Take by mouth daily.     metFORMIN (GLUCOPHAGE) 500 MG tablet TAKE 1 TABLET (500 MG TOTAL) BY MOUTH 2 (TWO) TIMES DAILY WITH A MEAL. 180 tablet 2   predniSONE (DELTASONE) 10 MG tablet 6 tablets on Day 1 , then reduce by 1 tablet daily until gone (Patient not taking: Reported on 08/12/2021) 21 tablet 0   No facility-administered medications prior to visit.    Review of Systems;  Patient denies headache, fevers, malaise, unintentional weight loss, skin rash, eye pain, sinus congestion and sinus pain, sore throat, dysphagia,  hemoptysis , cough, dyspnea, wheezing, chest pain, palpitations, orthopnea, edema, abdominal pain, nausea, melena, diarrhea, constipation, flank pain, dysuria, hematuria, urinary  Frequency, nocturia, numbness, tingling, seizures,  Focal weakness, Loss of consciousness,  Tremor, insomnia, depression, anxiety, and suicidal ideation.      Objective:  BP (!) 108/58 (BP Location: Left Arm, Patient Position: Sitting, Cuff Size:  Normal)   Pulse 71   Temp (!) 96.6 F (35.9 C) (Temporal)   Ht '5\' 1"'  (1.549 m)   Wt 150 lb (68 kg)   SpO2 96%   BMI 28.34 kg/m   BP Readings from Last 3 Encounters:  08/12/21 (!) 108/58  05/25/21 110/70  04/26/21 132/86    Wt Readings from Last 3 Encounters:  08/12/21 150 lb (68 kg)  07/21/21 150 lb (68 kg)  05/25/21 153 lb 12.8 oz (69.8 kg)    General appearance: alert, cooperative and appears stated age Ears: normal TM's and external ear canals both ears Throat: lips, mucosa, and tongue normal; teeth  and gums normal Neck: no adenopathy, no carotid bruit, supple, symmetrical, trachea midline and thyroid not enlarged, symmetric, no tenderness/mass/nodules Back: symmetric, no curvature. ROM normal. No CVA tenderness. Lungs: clear to auscultation bilaterally Heart: regular rate and rhythm, S1, S2 normal, no murmur, click, rub or gallop Abdomen: soft, non-tender; bowel sounds normal; no masses,  no organomegaly Pulses: 2+ and symmetric Skin: Skin color, texture, turgor normal. No rashes or lesions Lymph nodes: Cervical, supraclavicular, and axillary nodes normal.  Lab Results  Component Value Date   HGBA1C 6.7 (H) 08/12/2021   HGBA1C 6.9 (H) 03/18/2021   HGBA1C 7.6 (H) 12/10/2020    Lab Results  Component Value Date   CREATININE 0.89 08/12/2021   CREATININE 0.95 03/18/2021   CREATININE 0.90 12/10/2020    Lab Results  Component Value Date   WBC 4.8 12/08/2020   HGB 12.0 12/08/2020   HCT 36.2 12/08/2020   PLT 270 12/08/2020   GLUCOSE 107 (H) 08/12/2021   CHOL 186 03/18/2021   TRIG 92.0 03/18/2021   HDL 87.20 03/18/2021   LDLDIRECT 80.0 02/22/2017   LDLCALC 80 03/18/2021   ALT 17 08/12/2021   AST 22 08/12/2021   NA 139 08/12/2021   K 3.4 (L) 08/12/2021   CL 101 08/12/2021   CREATININE 0.89 08/12/2021   BUN 20 08/12/2021   CO2 30 08/12/2021   TSH 2.95 12/10/2020   INR 1.1 01/23/2009   HGBA1C 6.7 (H) 08/12/2021   MICROALBUR 0.7 03/18/2021    Pulmonary  Function Test [facility] Only  Result Date: 03/17/2021 Spirometry Data Is Acceptable and Reproducible No obvious evidence of Obstructive Airways disease or Restrictive Lung disease Consider outpatient follow up with Pulmonary if needed. Clinical Correlation Advised    Assessment & Plan:   Problem List Items Addressed This Visit     Type 2 DM with CKD stage 3 and hypertension ([place]) - Primary    Currently managed with ozempic 0.50 mg,  metformin and Levemir 15 units daily . She lhas lost 10 lbs on Ozempic since her last visit.  and is exercising regularly.  Recommending that she weight 2 time s weekly for the next 2 weeks to determine if the dose of ozempic needs increasing  Lab Results  Component Value Date   HGBA1C 6.7 (H) 08/12/2021   Lab Results  Component Value Date   LABMICR See below: 09/30/2019   MICROALBUR 0.7 03/18/2021   MICROALBUR <0.7 01/14/2020           Relevant Medications   metFORMIN (GLUCOPHAGE XR) 500 MG 24 hr tablet   Other Relevant Orders   Comp Met (CMET) (Completed)   HgB A1c (Completed)   Hyperlipidemia associated with type 2 diabetes mellitus ([place])    She has aortic atherosclerosis.  She is tolerating asa, atorvastatin ; LFTs are normal.   Lab Results  Component Value Date   CHOL 186 03/18/2021   HDL 87.20 03/18/2021   LDLCALC 80 03/18/2021   LDLDIRECT 80.0 02/22/2017   TRIG 92.0 03/18/2021   CHOLHDL 2 03/18/2021   Lab Results  Component Value Date   ALT 17 08/12/2021   AST 22 08/12/2021   ALKPHOS 77 08/12/2021   BILITOT 0.5 08/12/2021         Relevant Medications   metFORMIN (GLUCOPHAGE XR) 500 MG 24 hr tablet   Other Visit Diagnoses     Encounter for screening mammogram for malignant neoplasm of breast       Relevant Orders   MM 3D SCREEN BREAST BILATERAL  I provided  30 minutes  during this encounter reviewing patient's current problems and past surgeries, labs and imaging studies, providing counseling on the above mentioned  problems I na face to face visit  , and coordination  of care .   Meds ordered this encounter  Medications   metFORMIN (GLUCOPHAGE XR) 500 MG 24 hr tablet    Sig: Take 1 tablet (500 mg total) by mouth daily with breakfast.    Dispense:  90 tablet    Refill:  1    Medications Discontinued During This Encounter  Medication Reason   predniSONE (DELTASONE) 10 MG tablet    metFORMIN (GLUCOPHAGE) 500 MG tablet     Follow-up: Return in about 3 months (around 11/12/2021) for follow up diabetes.   Crecencio Mc, MD

## 2021-08-12 NOTE — Assessment & Plan Note (Signed)
Currently managed with ozempic 0.50 mg,  metformin and Levemir 15 units daily . She lhas lost 10 lbs on Ozempic since her last visit.  and is exercising regularly.  Recommending that she weight 2 time s weekly for the next 2 weeks to determine if the dose of ozempic needs increasing  Lab Results  Component Value Date   HGBA1C 6.7 (H) 08/12/2021   Lab Results  Component Value Date   LABMICR See below: 09/30/2019   MICROALBUR 0.7 03/18/2021   MICROALBUR <0.7 01/14/2020

## 2021-08-12 NOTE — Patient Instructions (Addendum)
We are changing metformin to a once daily pil you can take at lunchtime    Please check blood pressure once daily at various times so we can see if we need to lower your medication :  Morning ,  before you take the losartan   1 hour after your losartan (if you are home)    Weigh yourself  twice  a week for the next 2 weeks  WRITE DOWN THE READINGS AND SEND MY A [place]   so we can decide if the ozempic dose needs to be increaesd to 1.0 mg weekly

## 2021-08-12 NOTE — Assessment & Plan Note (Signed)
She has aortic atherosclerosis.  She is tolerating asa, atorvastatin ; LFTs are normal.   Lab Results  Component Value Date   CHOL 186 03/18/2021   HDL 87.20 03/18/2021   LDLCALC 80 03/18/2021   LDLDIRECT 80.0 02/22/2017   TRIG 92.0 03/18/2021   CHOLHDL 2 03/18/2021   Lab Results  Component Value Date   ALT 17 08/12/2021   AST 22 08/12/2021   ALKPHOS 77 08/12/2021   BILITOT 0.5 08/12/2021

## 2021-08-13 ENCOUNTER — Other Ambulatory Visit: Payer: Self-pay | Admitting: Internal Medicine

## 2021-08-13 MED ORDER — POTASSIUM CHLORIDE CRYS ER 20 MEQ PO TBCR: 20.0000 meq | EXTENDED_RELEASE_TABLET | ORAL | 1 refills | Status: DC

## 2021-08-24 DIAGNOSIS — Z1231 Encounter for screening mammogram for malignant neoplasm of breast: Secondary | ICD-10-CM | POA: Diagnosis not present

## 2021-08-24 LAB — HM MAMMOGRAPHY

## 2021-08-26 ENCOUNTER — Telehealth: Payer: Self-pay | Admitting: Internal Medicine

## 2021-08-26 DIAGNOSIS — E119 Type 2 diabetes mellitus without complications: Secondary | ICD-10-CM | POA: Diagnosis not present

## 2021-08-26 DIAGNOSIS — H04123 Dry eye syndrome of bilateral lacrimal glands: Secondary | ICD-10-CM | POA: Diagnosis not present

## 2021-08-26 DIAGNOSIS — H52223 Regular astigmatism, bilateral: Secondary | ICD-10-CM | POA: Diagnosis not present

## 2021-08-26 DIAGNOSIS — H524 Presbyopia: Secondary | ICD-10-CM | POA: Diagnosis not present

## 2021-08-26 DIAGNOSIS — H5203 Hypermetropia, bilateral: Secondary | ICD-10-CM | POA: Diagnosis not present

## 2021-08-26 DIAGNOSIS — Z7984 Long term (current) use of oral hypoglycemic drugs: Secondary | ICD-10-CM | POA: Diagnosis not present

## 2021-08-26 LAB — HM DIABETES EYE EXAM

## 2021-08-30 ENCOUNTER — Telehealth: Payer: Self-pay | Admitting: Internal Medicine

## 2021-08-30 NOTE — Telephone Encounter (Signed)
Patient dropped off sugar and bp readings. Paper is up front in [place]'s color folder.

## 2021-08-30 NOTE — Telephone Encounter (Signed)
Placed in results folder.  

## 2021-08-31 NOTE — Telephone Encounter (Signed)
Blood pressures are also at goal.  Continue current medications  Regards,   Deborra Medina, MD

## 2021-08-31 NOTE — Telephone Encounter (Signed)
Patient returned office phone call. 

## 2021-08-31 NOTE — Telephone Encounter (Signed)
Sugars all look good, she can stop sending me them  Lab Results  Component Value Date   HGBA1C 6.7 (H) 08/12/2021

## 2021-08-31 NOTE — Telephone Encounter (Signed)
LMTCB

## 2021-08-31 NOTE — Telephone Encounter (Signed)
Pt is aware.  

## 2021-09-02 DIAGNOSIS — I10 Essential (primary) hypertension: Secondary | ICD-10-CM | POA: Diagnosis not present

## 2021-09-02 DIAGNOSIS — N1831 Chronic kidney disease, stage 3a: Secondary | ICD-10-CM | POA: Diagnosis not present

## 2021-09-02 DIAGNOSIS — E1122 Type 2 diabetes mellitus with diabetic chronic kidney disease: Secondary | ICD-10-CM | POA: Diagnosis not present

## 2021-09-02 DIAGNOSIS — R600 Localized edema: Secondary | ICD-10-CM | POA: Diagnosis not present

## 2021-09-07 DIAGNOSIS — E785 Hyperlipidemia, unspecified: Secondary | ICD-10-CM | POA: Diagnosis not present

## 2021-09-07 DIAGNOSIS — E1169 Type 2 diabetes mellitus with other specified complication: Secondary | ICD-10-CM | POA: Diagnosis not present

## 2021-09-07 DIAGNOSIS — I872 Venous insufficiency (chronic) (peripheral): Secondary | ICD-10-CM | POA: Diagnosis not present

## 2021-09-07 DIAGNOSIS — I10 Essential (primary) hypertension: Secondary | ICD-10-CM | POA: Diagnosis not present

## 2021-09-10 DIAGNOSIS — H04123 Dry eye syndrome of bilateral lacrimal glands: Secondary | ICD-10-CM | POA: Diagnosis not present

## 2021-09-10 DIAGNOSIS — H5203 Hypermetropia, bilateral: Secondary | ICD-10-CM | POA: Diagnosis not present

## 2021-09-10 DIAGNOSIS — H524 Presbyopia: Secondary | ICD-10-CM | POA: Diagnosis not present

## 2021-09-10 DIAGNOSIS — H52223 Regular astigmatism, bilateral: Secondary | ICD-10-CM | POA: Diagnosis not present

## 2021-09-10 DIAGNOSIS — H10233 Serous conjunctivitis, except viral, bilateral: Secondary | ICD-10-CM | POA: Diagnosis not present

## 2021-09-16 ENCOUNTER — Encounter: Payer: Self-pay | Admitting: Internal Medicine

## 2021-09-16 DIAGNOSIS — N3946 Mixed incontinence: Secondary | ICD-10-CM | POA: Diagnosis not present

## 2021-09-16 DIAGNOSIS — J309 Allergic rhinitis, unspecified: Secondary | ICD-10-CM | POA: Diagnosis not present

## 2021-09-16 DIAGNOSIS — E785 Hyperlipidemia, unspecified: Secondary | ICD-10-CM | POA: Diagnosis not present

## 2021-09-16 DIAGNOSIS — I701 Atherosclerosis of renal artery: Secondary | ICD-10-CM | POA: Diagnosis not present

## 2021-09-16 DIAGNOSIS — E663 Overweight: Secondary | ICD-10-CM | POA: Diagnosis not present

## 2021-09-16 DIAGNOSIS — M810 Age-related osteoporosis without current pathological fracture: Secondary | ICD-10-CM | POA: Diagnosis not present

## 2021-09-16 DIAGNOSIS — F325 Major depressive disorder, single episode, in full remission: Secondary | ICD-10-CM | POA: Diagnosis not present

## 2021-09-16 DIAGNOSIS — J300 Vasomotor rhinitis: Secondary | ICD-10-CM | POA: Diagnosis not present

## 2021-09-16 DIAGNOSIS — J449 Chronic obstructive pulmonary disease, unspecified: Secondary | ICD-10-CM | POA: Diagnosis not present

## 2021-09-16 DIAGNOSIS — K2270 Barrett's esophagus without dysplasia: Secondary | ICD-10-CM | POA: Diagnosis not present

## 2021-09-16 DIAGNOSIS — M1990 Unspecified osteoarthritis, unspecified site: Secondary | ICD-10-CM | POA: Diagnosis not present

## 2021-09-16 DIAGNOSIS — F419 Anxiety disorder, unspecified: Secondary | ICD-10-CM | POA: Diagnosis not present

## 2021-09-16 DIAGNOSIS — Z6827 Body mass index (BMI) 27.0-27.9, adult: Secondary | ICD-10-CM | POA: Diagnosis not present

## 2021-09-16 DIAGNOSIS — E1142 Type 2 diabetes mellitus with diabetic polyneuropathy: Secondary | ICD-10-CM | POA: Diagnosis not present

## 2021-09-16 DIAGNOSIS — K219 Gastro-esophageal reflux disease without esophagitis: Secondary | ICD-10-CM | POA: Diagnosis not present

## 2021-09-16 DIAGNOSIS — Z794 Long term (current) use of insulin: Secondary | ICD-10-CM | POA: Diagnosis not present

## 2021-09-16 DIAGNOSIS — I872 Venous insufficiency (chronic) (peripheral): Secondary | ICD-10-CM | POA: Diagnosis not present

## 2021-09-16 DIAGNOSIS — E1151 Type 2 diabetes mellitus with diabetic peripheral angiopathy without gangrene: Secondary | ICD-10-CM | POA: Diagnosis not present

## 2021-09-29 ENCOUNTER — Inpatient Hospital Stay: Payer: Medicare PPO | Attending: Obstetrics and Gynecology | Admitting: Nurse Practitioner

## 2021-09-29 ENCOUNTER — Other Ambulatory Visit: Payer: Self-pay

## 2021-09-29 VITALS — BP 132/64 | HR 70 | Temp 97.8°F | Resp 20 | Wt 142.6 lb

## 2021-09-29 DIAGNOSIS — G4733 Obstructive sleep apnea (adult) (pediatric): Secondary | ICD-10-CM | POA: Insufficient documentation

## 2021-09-29 DIAGNOSIS — N393 Stress incontinence (female) (male): Secondary | ICD-10-CM | POA: Insufficient documentation

## 2021-09-29 DIAGNOSIS — Z923 Personal history of irradiation: Secondary | ICD-10-CM | POA: Diagnosis not present

## 2021-09-29 DIAGNOSIS — I129 Hypertensive chronic kidney disease with stage 1 through stage 4 chronic kidney disease, or unspecified chronic kidney disease: Secondary | ICD-10-CM | POA: Diagnosis not present

## 2021-09-29 DIAGNOSIS — N189 Chronic kidney disease, unspecified: Secondary | ICD-10-CM | POA: Insufficient documentation

## 2021-09-29 DIAGNOSIS — Z90710 Acquired absence of both cervix and uterus: Secondary | ICD-10-CM | POA: Diagnosis not present

## 2021-09-29 DIAGNOSIS — Z90722 Acquired absence of ovaries, bilateral: Secondary | ICD-10-CM | POA: Insufficient documentation

## 2021-09-29 DIAGNOSIS — C55 Malignant neoplasm of uterus, part unspecified: Secondary | ICD-10-CM | POA: Diagnosis not present

## 2021-09-29 DIAGNOSIS — Z79811 Long term (current) use of aromatase inhibitors: Secondary | ICD-10-CM | POA: Diagnosis not present

## 2021-09-29 DIAGNOSIS — Z8542 Personal history of malignant neoplasm of other parts of uterus: Secondary | ICD-10-CM | POA: Diagnosis not present

## 2021-09-29 DIAGNOSIS — Z08 Encounter for follow-up examination after completed treatment for malignant neoplasm: Secondary | ICD-10-CM | POA: Diagnosis not present

## 2021-09-29 NOTE — Patient Instructions (Signed)
Please schedule follow up exam with Dr. Marcelline Mates in 6 months and we will see you in a year. Glad you are doing so well and if you need anything in the interim, please let me know.

## 2021-09-29 NOTE — Progress Notes (Signed)
Gynecologic Oncology Interval Visit   Referring Provider: Brayton Mars, MD/Dr. Marcelline Mates  Chief Concern: Stage IB WELL-DIFFERENTIATED ENDOMETRIOID ADENOCARCINOMA of the endometrium with positive cytology  Subjective:  Sandra Hendrix is a 76 y.o. female who is seen in consultation from Dr. Enzo Bi for stage IB grade 1 endometrial cancer with positive cytology , s/p TLH, BSO, SLN mapping and biopsies on 03/01/17, XRT, who returns to clinic today for routine follow up and pelvic exam.   She completed 2 years of letrozole and discontinued treatment 09/11/2019. She saw Dr. Marcelline Mates for interval exam in July 2022. Dr. Baruch Gouty has released from care.   She had her mammogram 08/24/21 at [facility] which was negative. Last colonoscopy and endoscopy were March 2022.   She feels well and denies complaints. She is exercising at a gym 3 days a week. No bleeding, discharge, pain, or changes in bowel movements.    Gynecologic Oncology History She presented with postmenopausal bleeding. Her evaluation included Pap on 10/17/2016 with endometrial cells present otherwise NILM; and on 11/16/2016 endometrial biopsy that revealed atypical glandular epithelium.   Her evaluation also included pelvic US 10/17/2016 that showed uterine mass.  She underwent hysteroscopy/D&C on 01/02/2017 that revealed grade 1 endometrioid endometrial cancer.  Definitive cancer surgery was scheduled 5/9, but was delayed a month due to death of her bother on 02/04/23.     Underwent TLH, BSO, SLN mapping and biopsies on 03/01/17  DIAGNOSIS:  A. SENTINEL LYMPH NODE 1, LEFT EXTERNAL ILIAC; EXCISION:  - NO TUMOR SEEN IN ONE LYMPH NODE (0/1).   B. SENTINEL LYMPH NODE 2, LEFT EXTERNAL ILIAC; EXCISION:  - NO TUMOR SEEN IN ONE LYMPH NODE (0/1).   C. SENTINEL LYMPH NODE 3, LEFT EXTERNAL ILIAC; EXCISION:  - NO TUMOR SEEN IN ONE LYMPH NODE (0/1).   D. SENTINEL LYMPH NODE, RIGHT EXTERNAL ILIAC; EXCISION:  - NO TUMOR SEEN IN ONE LYMPH NODE (0/1).    E. UTERUS WITH CERVIX, BILATERAL FALLOPIAN TUBES AND OVARIES;  LAPAROSCOPIC TOTAL HYSTERECTOMY AND BILATERAL SALPINGO-OOPHORECTOMY:  - ENDOMETRIOID CARCINOMA WITH SQUAMOUS DIFFERENTIATION, FIGO GRADE I.  - INVASION EXTENDS INTO THE OUTER HALF OF THE MYOMETRIUM.  - AGE-RELATED CHANGE, BILATERAL OVARIES.  - PARATUBAL CYSTS, BILATERAL FALLOPIAN TUBES.  - SEE SUMMARY BELOW.   Surgical Pathology Cancer Case Summary  ENDOMETRIUM:  Procedure:  Laparoscopic total hysterectomy, bilateral  salpingo-oophorectomy and sentinel lymph node excision  Histologic Type: Endometrioid carcinoma with squamous differentiation  Histologic Grade: FIGO grade 1  Myometrial Invasion: Present; Depth of invasion: 26.75 mm; Myometrial  thickness (millimeters): 27 mm; Percentage of myometrial invasion: 99%  Uterine Serosa Involvement: Not identified  Cervical Stromal Involvement: Not identified  Other Tissue/Organ Involvement: Not identified  Peritoneal/ Ascitic Fluid: Positive for malignancy  Margins: Not applicable  Lymphovascular Invasion: Not identified  Regional Lymph Nodes:  # of pelvic nodes with macrometastasis: 0  # of pelvic nodes with micrometastasis: 0  Total # of pelvic nodes examined: 4   # of pelvic sentinel nodes examined: 4   Laterality of pelvic nodes examined: left and right  Pathologic Stage Classification (pTNM, AJCC 8th Edition): pT1b pN0 (sn)  / FIGO IB   She received adjuvant whole pelvic radiation w/ Dr. Baruch Gouty over 5 weeks completing 05/23/17. She did have a post-radiation UTI which was treated and resolved with antibiotics.   She initiated on Letrozole on 05/15/17 for positive washings.   11/16/17 Transvaginal u/s with Dr. Enzo Bi on was negative for cysts or masses.   12/20/2017 in [place]  [facility] clinic and had a negative exam. DEXA scan 02/09/2015 normal bone density.  10/23/2018 Dexa scan performed reported as normal with t-score of 0.3. Plan to repeat in 2022.  She  alternates visits between Dr. Marcelline Mates and gyn-onc.   She completed 2.5 years of letrozole 08/2019.   Problem List: Patient Active Problem List   Diagnosis Date Noted   COVID-19 virus infection 07/21/2021   Atypical chest pain 12/15/2020   Mucopurulent chronic bronchitis ([place]) 12/14/2020   Forgetfulness 10/23/2020   Chronic pain of right ankle 09/08/2020   Atherosclerosis of renal artery ([place]) 07/01/2020   Left wrist sprain, sequela 02/24/2020   Right hand pain 02/24/2020   Hepatic steatosis 07/18/2019   Precordial pain 07/03/2019   Epigastric pain 07/03/2019   Pain in right knee 10/31/2018   Chronic hip pain, left 09/29/2018   Hyperlipidemia associated with type 2 diabetes mellitus ([place]) 03/29/2018   Bilateral pes planus 03/29/2018   SUI (stress urinary incontinence, female) 11/23/2017   Vaginal atrophy 11/23/2017   Obesity (BMI 30.0-34.9) 06/20/2017   Carpal tunnel syndrome on right 06/20/2017   Endometrioid adenocarcinoma of uterus ([place]) 01/19/2017   OSA on CPAP 11/24/2016   Cervical stenosis (uterine cervix) 11/16/2016   CKD (chronic kidney disease) stage 3, GFR 30-59 ml/min (HCC) 11/07/2014   GERD (gastroesophageal reflux disease) 12/26/2013   Irritable bowel syndrome with constipation 12/26/2013   Encounter for preventive health examination 10/21/2013   Hyperlipidemia LDL goal <100 07/15/2013   Chronic venous insufficiency 03/21/2013   Type 2 DM with CKD stage 3 and hypertension ([place]) 11/05/2012   Lack of libido 11/05/2012   Barrett's esophagus 11/05/2012   Hiatal hernia 11/05/2012   Essential hypertension 11/05/2012    Past Medical History: Past Medical History:  Diagnosis Date   Allergy    Anxiety    Arthritis    Barrett's esophagus determined by endoscopy    Cancer ([place])    Uterine Cancer   Chronic kidney disease    Chronic venous insufficiency    Depression    Diabetes mellitus without complication ([place])    Difficult intubation    "not sure what  happened,asleep was told had problem with intubation"   Diverticulosis    Fatty liver    GERD (gastroesophageal reflux disease)    History of bronchitis    History of hiatal hernia    Hyperlipidemia    Hypertension    Irritable bowel syndrome with constipation    Sleep apnea    Use C- PAP   Urinary incontinence    Varicose veins     Past Surgical History: Past Surgical History:  Procedure Laterality Date   CARDIAC CATHETERIZATION     CHOLECYSTECTOMY     COLONOSCOPY WITH PROPOFOL N/A 06/14/2016   Procedure: COLONOSCOPY WITH PROPOFOL;  Surgeon: Lollie Sails, MD;  Location: [facility] ENDOSCOPY;  Service: Endoscopy;  Laterality: N/A;   COLONOSCOPY WITH PROPOFOL N/A 12/18/2020   Procedure: COLONOSCOPY WITH PROPOFOL;  Surgeon: Lesly Rubenstein, MD;  Location: [facility] ENDOSCOPY;  Service: Endoscopy;  Laterality: N/A;   ESOPHAGOGASTRODUODENOSCOPY N/A 12/18/2020   Procedure: ESOPHAGOGASTRODUODENOSCOPY (EGD);  Surgeon: Lesly Rubenstein, MD;  Location[facility] ENDOSCOPY;  Service: Endoscopy;  Laterality: N/A;   ESOPHAGOGASTRODUODENOSCOPY (EGD) WITH PROPOFOL N/A 06/14/2016   Procedure: ESOPHAGOGASTRODUODENOSCOPY (EGD) WITH PROPOFOL;  Surgeon: Lollie Sails, MD;  Location[facility] ENDOSCOPY;  Service: Endoscopy;  Laterality: N/A;   ESOPHAGOGASTRODUODENOSCOPY (EGD) WITH PROPOFOL N/A 06/12/2017   Procedure: ESOPHAGOGASTRODUODENOSCOPY (EGD) WITH PROPOFOL;  Surgeon: Lollie Sails, MD;  Location: [facility] ENDOSCOPY;  Service: Endoscopy;  Laterality: N/A;   ESOPHAGOGASTRODUODENOSCOPY (EGD) WITH PROPOFOL N/A 08/25/2017   Procedure: ESOPHAGOGASTRODUODENOSCOPY (EGD) WITH PROPOFOL;  Surgeon: Lollie Sails, MD;  Location: [facility] ENDOSCOPY;  Service: Endoscopy;  Laterality: N/A;   HYSTEROSCOPY WITH D & C N/A 01/02/2017   Procedure: DILATATION AND CURETTAGE /HYSTEROSCOPY;  Surgeon: Brayton Mars, MD;  Location: [facility] ORS;  Service: Gynecology;  Laterality: N/A;   JOINT REPLACEMENT Right 2007   Total  Knee Replacement   JOINT REPLACEMENT Left 2004   Total Knee Replacement   LAPAROSCOPIC HYSTERECTOMY Bilateral 03/01/2017   Procedure: HYSTERECTOMY TOTAL LAPAROSCOPIC BSO;  Surgeon: Mellody Drown, MD;  Location: [facility] ORS;  Service: Gynecology;  Laterality: Bilateral;   ROTATOR CUFF REPAIR Right    SENTINEL NODE BIOPSY N/A 03/01/2017   Procedure: SENTINEL NODE INJECTION AND BIOPSY;  Surgeon: Mellody Drown, MD;  Location: [facility] ORS;  Service: Gynecology;  Laterality: N/A;   TONSILLECTOMY     as a child   TUBAL LIGATION     UPPER GI ENDOSCOPY     x2   Past Gynecologic History:  Patient is postmenopausal. Menarche: 8 History of Abnormal pap: yes,  endometrial cells w/o NILM Last pap:  2018 as per interval history   Last Pap:10/17/2016 negative for intraepithelial lesions or malignancy. Benign reparative/reactive changes. ENDOMETRIAL CELLS PRESENT Sexually active: no;  She did not have mammogram in 2018. 2019 Mammogram has been ordered.    OB History:  OB History  Gravida Para Term Preterm AB Living  '2 2 2     2  ' SAB IAB Ectopic Multiple Live Births          2    # Outcome Date GA Lbr Len/2nd Weight Sex Delivery Anes PTL Lv  2 Term 1972   9 lb (4.082 kg) F Vag-Spont   LIV  1 Term 1970   9 lb (4.082 kg) F Vag-Spont   LIV    Family History: Family History  Problem Relation Age of Onset   Stroke Mother    Hypertension Mother    Lupus Mother    Alcohol abuse Father    Diabetes Father    Cancer Father        gallbladder ca into liver    Arthritis Sister    Diabetes Sister    Lumbar disc disease Sister    Hyperlipidemia Daughter    Hypertension Daughter    Cancer Maternal Aunt        ovarian ca   Diabetes Paternal Aunt    Drug abuse Paternal Aunt    Cancer Maternal Grandfather        colon ca   Hypertension Brother    Alcohol abuse Brother    Stroke Brother     Social History: Social History   Socioeconomic History   Marital status: Married    Spouse name: Not  on file   Number of children: 2   Years of education: 14   Highest education level: Some college, no degree  Occupational History   Not on file  Tobacco Use   Smoking status: Never   Smokeless tobacco: Never  Vaping Use   Vaping Use: Never used  Substance and Sexual Activity   Alcohol use: Not Currently    Comment: rare   Drug use: No   Sexual activity: Yes    Comment: not often  Other Topics Concern   Not on file  Social History Narrative   Not on file  Social Determinants of Health   Financial Resource Strain: Not on file  Food Insecurity: No Food Insecurity   Worried About Charity fundraiser in the Last Year: Never true   Ran Out of Food in the Last Year: Never true  Transportation Needs: No Transportation Needs   Lack of Transportation (Medical): No   Lack of Transportation (Non-Medical): No  Physical Activity: Sufficiently Active   Days of Exercise per Week: 3 days   Minutes of Exercise per Session: 60 min  Stress: No Stress Concern Present   Feeling of Stress : Not at all  Social Connections: Socially Integrated   Frequency of Communication with Friends and Family: More than three times a week   Frequency of Social Gatherings with Friends and Family: More than three times a week   Attends Religious Services: More than 4 times per year   Active Member of [employer] or Organizations: Yes   Attends Music therapist: More than 4 times per year   Marital Status: Married  Human resources officer Violence: Not At Risk   Fear of Current or Ex-Partner: No   Emotionally Abused: No   Physically Abused: No   Sexually Abused: No    Allergies: Allergies  Allergen Reactions   Clindamycin/Lincomycin Rash   Codeine Rash   Lincomycin Rash   Morphine And Related Nausea Only   Talwin [Pentazocine] Nausea And Vomiting   Lisinopril Cough    cough   Naloxone Other (See Comments)    Had issues with sedation and vomiting with EGD   Oxycodone-Acetaminophen Other (See  Comments)    Pt states this medication makes her bowels stop moving   Warfarin And Related Nausea Only    Current Medications: Current Outpatient Medications  Medication Sig Dispense Refill   acetaminophen (TYLENOL) 500 MG tablet Take 2 tablets (1,000 mg total) by mouth every 6 (six) hours. (Patient taking differently: Take 1,000 mg by mouth every 6 (six) hours as needed.) 65 tablet 1   albuterol (PROVENTIL HFA;VENTOLIN HFA) 108 (90 BASE) MCG/ACT inhaler Inhale 2 puffs into the lungs every 6 (six) hours as needed for wheezing or shortness of breath.      Ascorbic Acid 500 MG CHEW Chew by mouth.     atorvastatin (LIPITOR) 40 MG tablet TAKE 1/2 TABLET EVERY DAY AT 6 PM 45 tablet 1   azelastine (ASTELIN) 0.1 % nasal spray Place 1 spray into both nostrils daily.     benzonatate (TESSALON) 100 MG capsule Take 1 capsule (100 mg total) by mouth 3 (three) times daily as needed for cough. 21 capsule 0   Biotin 5000 MCG TABS Take by mouth 2 (two) times daily.      blood glucose meter kit and supplies Use to check blood sugar twice daily. ICD 10: E11.29 1 each 0   Cetirizine HCl 10 MG CAPS Take by mouth.     Cinnamon Bark POWD Take by mouth.     COLLAGEN PO Take by mouth 2 (two) times daily. For improved fingernail strength     dapagliflozin propanediol (FARXIGA) 10 MG TABS tablet Take 10 mg by mouth daily. Daily     docusate sodium (COLACE) 100 MG capsule Take 1 capsule (100 mg total) by mouth 2 (two) times daily. 20 capsule 0   fluticasone (FLONASE) 50 MCG/ACT nasal spray Place 1 spray into both nostrils daily. 48 g 1   fluticasone (FLOVENT DISKUS) 50 MCG/BLIST diskus inhaler Inhale 1 puff into the lungs 2 (two) times daily.  furosemide (LASIX) 20 MG tablet Take 1 tablet (20 mg total) by mouth every other day. 45 tablet 3   glucose blood (ACCU-CHEK GUIDE) test strip USE 1 STRIP TO TEST BLOOD SUGARS  TWICE DAILY 50 each 0   insulin detemir (LEVEMIR) 100 UNIT/ML injection INJECT 18 UNITS  SUBCUTANEOUSLY AT BEDTIME. DISCARD VIAL 42 DAYS AFTER OPENING (Patient taking differently: INJECT 15 UNITS SUBCUTANEOUSLY AT BEDTIME. DISCARD VIAL 42 DAYS AFTER OPENING) 10 mL 2   Lancet Devices (ACCU-CHEK SOFTCLIX) lancets Use to test blood sugars 1 -2 times daily 1 each 3   levocetirizine (XYZAL) 5 MG tablet Take 5 mg by mouth every evening.     losartan-hydrochlorothiazide (HYZAAR) 50-12.5 MG tablet TAKE 1 TABLET BY MOUTH  DAILY 90 tablet 3   Magnesium 250 MG TABS Take 1 tablet by mouth daily.     meclizine (ANTIVERT) 25 MG tablet Take 1 tablet (25 mg total) by mouth 3 (three) times daily as needed for dizziness. 30 tablet 3   metFORMIN (GLUCOPHAGE XR) 500 MG 24 hr tablet Take 1 tablet (500 mg total) by mouth daily with breakfast. 90 tablet 1   Multiple Vitamin (MULTIVITAMIN) tablet Take 1 tablet by mouth daily.     oxybutynin (DITROPAN-XL) 10 MG 24 hr tablet Take 1 tablet (10 mg total) by mouth daily. 90 tablet 3   pantoprazole (PROTONIX) 40 MG tablet Take 1 tablet by mouth twice daily 180 tablet 0   potassium chloride SA (KLOR-CON) 20 MEQ tablet Take 1 tablet (20 mEq total) by mouth every other day. 45 tablet 1   Probiotic Product (PROBIOTIC-10) CAPS Take 1 capsule by mouth daily.      Semaglutide,0.25 or 0.5MG/DOS, (OZEMPIC, 0.25 OR 0.5 MG/DOSE,) 2 MG/1.5ML SOPN Inject 0.5 mg into the skin once a week. 4.5 mL 5   sertraline (ZOLOFT) 50 MG tablet TAKE 1 TABLET EVERY DAY 90 tablet 3   tiZANidine (ZANAFLEX) 4 MG tablet tizanidine 4 mg tablet     verapamil (VERELAN PM) 180 MG 24 hr capsule TAKE 1 CAPSULE EVERY DAY 90 capsule 3   vitamin B-12 (CYANOCOBALAMIN) 1000 MCG tablet Take 1,000 mcg by mouth daily.     Zinc 50 MG TABS Take by mouth daily.     No current facility-administered medications for this visit.    Review of Systems General:  no complaints Skin: no complaints Eyes: no complaints HEENT: no complaints Breasts: no complaints Pulmonary: no complaints Cardiac: no  complaints Gastrointestinal: no complaints Genitourinary/Sexual: no complaints Ob/Gyn: no complaints Musculoskeletal: no complaints Hematology: no complaints Neurologic/Psych: no complaints   Objective:  Physical Examination:  BP 132/64    Pulse 70    Temp 97.8 F (36.6 C)    Resp 20    Wt 142 lb 9.6 oz (64.7 kg)    SpO2 100%    BMI 26.94 kg/m   ECOG Performance Status: 0 - Asymptomatic  GENERAL: Patient is a well appearing female in no acute distress HEENT:  PERRL, EOM intact LUNGS:  Clear to auscultation bilaterally.  No wheezes or rhonchi. HEART:  Regular rate and rhythm. No murmur appreciated. ABDOMEN:  Soft, nontender.  Positive, normoactive bowel sounds. Well healed surgical scars MSK:  ambulatory w/o aids EXTREMITIES:  No peripheral edema.   SKIN:  Clear with no obvious rashes or skin changes.  NEURO:  Nonfocal. Well oriented.  Appropriate affect.  Pelvic: exam chaperoned by CMA;  Vulva: normal appearing vulva w/o masses, tenderness or lesions. Vagina: normal vagina with well healed cuff.  Anterior cystocele mild. Bimanual: no evidence of masses, smooth. RV: not indicated   Assessment:  Modesty P Colver is a 76 y.o. female w/ hx of stage IB, grade 1 endometrioid cancer (positive cytology) w/ hx of of disease invading full thickness of myometrium and positive pelvic peritoneal cytology, bilaterial sentinel nodes, cervix, and adnexa were negative. She is s/p whole pelvic radiation and completed 2.5 years of letrozole for positive cytology in December 2020. No evidence of disease on exam.   Stress urinary incontinence stable.   Medical co-morbidities complicating care include: OSA on CPAP, HTN DM, CKD  Plan:   Problem List Items Addressed This Visit   None  I applauded her on her intentional weight loss and exercising regularly.   Given previous use of letrozole I recommend repeating bone density scan as this medication can contribute to bone loss. Previous was in 2020.    Continue surveillance visits every 6 months. Will continue to alternate with Dr. Marcelline Mates and we will se eher back in a year. After 5 years we can release her to annual screening with Dr. Marcelline Mates.   Urinary frequency- improved on medication.  Continue follow up with Dr. Marcelline Mates and Dr. Derrel Nip for other medical problems and routine screenings.   Beckey Rutter, DNP, AGNP-C [place] [facility] [phone] (clinic)  CC: Dr. Marcelline Mates  PCP: Dr. Derrel Nip

## 2021-10-05 DIAGNOSIS — K5909 Other constipation: Secondary | ICD-10-CM | POA: Diagnosis not present

## 2021-10-05 DIAGNOSIS — K219 Gastro-esophageal reflux disease without esophagitis: Secondary | ICD-10-CM | POA: Diagnosis not present

## 2021-10-05 DIAGNOSIS — Z800 Family history of malignant neoplasm of digestive organs: Secondary | ICD-10-CM | POA: Diagnosis not present

## 2021-10-12 ENCOUNTER — Encounter: Payer: Self-pay | Admitting: Internal Medicine

## 2021-10-12 DIAGNOSIS — R053 Chronic cough: Secondary | ICD-10-CM | POA: Diagnosis not present

## 2021-10-12 DIAGNOSIS — J45991 Cough variant asthma: Secondary | ICD-10-CM | POA: Diagnosis not present

## 2021-10-12 DIAGNOSIS — J300 Vasomotor rhinitis: Secondary | ICD-10-CM | POA: Diagnosis not present

## 2021-10-13 ENCOUNTER — Other Ambulatory Visit: Payer: Self-pay | Admitting: Internal Medicine

## 2021-10-22 DIAGNOSIS — H10233 Serous conjunctivitis, except viral, bilateral: Secondary | ICD-10-CM | POA: Diagnosis not present

## 2021-10-22 DIAGNOSIS — H04123 Dry eye syndrome of bilateral lacrimal glands: Secondary | ICD-10-CM | POA: Diagnosis not present

## 2021-10-22 DIAGNOSIS — H524 Presbyopia: Secondary | ICD-10-CM | POA: Diagnosis not present

## 2021-10-22 DIAGNOSIS — H52223 Regular astigmatism, bilateral: Secondary | ICD-10-CM | POA: Diagnosis not present

## 2021-10-22 DIAGNOSIS — H5203 Hypermetropia, bilateral: Secondary | ICD-10-CM | POA: Diagnosis not present

## 2021-11-15 ENCOUNTER — Other Ambulatory Visit: Payer: Medicare PPO

## 2021-11-15 ENCOUNTER — Encounter: Payer: Self-pay | Admitting: Internal Medicine

## 2021-11-15 ENCOUNTER — Ambulatory Visit: Payer: Medicare PPO | Admitting: Internal Medicine

## 2021-11-15 ENCOUNTER — Other Ambulatory Visit: Payer: Self-pay

## 2021-11-15 VITALS — BP 92/60 | HR 70 | Temp 97.8°F | Ht 61.0 in | Wt 138.6 lb

## 2021-11-15 DIAGNOSIS — I129 Hypertensive chronic kidney disease with stage 1 through stage 4 chronic kidney disease, or unspecified chronic kidney disease: Secondary | ICD-10-CM | POA: Diagnosis not present

## 2021-11-15 DIAGNOSIS — N1831 Chronic kidney disease, stage 3a: Secondary | ICD-10-CM | POA: Diagnosis not present

## 2021-11-15 DIAGNOSIS — K760 Fatty (change of) liver, not elsewhere classified: Secondary | ICD-10-CM | POA: Diagnosis not present

## 2021-11-15 DIAGNOSIS — N183 Chronic kidney disease, stage 3 unspecified: Secondary | ICD-10-CM | POA: Diagnosis not present

## 2021-11-15 DIAGNOSIS — E1122 Type 2 diabetes mellitus with diabetic chronic kidney disease: Secondary | ICD-10-CM | POA: Diagnosis not present

## 2021-11-15 DIAGNOSIS — E663 Overweight: Secondary | ICD-10-CM | POA: Diagnosis not present

## 2021-11-15 DIAGNOSIS — I872 Venous insufficiency (chronic) (peripheral): Secondary | ICD-10-CM | POA: Diagnosis not present

## 2021-11-15 DIAGNOSIS — I10 Essential (primary) hypertension: Secondary | ICD-10-CM

## 2021-11-15 DIAGNOSIS — I701 Atherosclerosis of renal artery: Secondary | ICD-10-CM

## 2021-11-15 LAB — MICROALBUMIN / CREATININE URINE RATIO
Creatinine,U: 114.4 mg/dL
Microalb Creat Ratio: 0.9 mg/g (ref 0.0–30.0)
Microalb, Ur: 1 mg/dL (ref 0.0–1.9)

## 2021-11-15 LAB — LIPID PANEL
Cholesterol: 206 mg/dL — ABNORMAL HIGH (ref 0–200)
HDL: 87.7 mg/dL (ref >39.00)
LDL Cholesterol: 101 mg/dL — ABNORMAL HIGH (ref 0–99)
NonHDL: 118.79
Total CHOL/HDL Ratio: 2
Triglycerides: 89 mg/dL (ref 0.0–149.0)
VLDL: 17.8 mg/dL (ref 0.0–40.0)

## 2021-11-15 LAB — COMPREHENSIVE METABOLIC PANEL
ALT: 18 U/L (ref 0–35)
AST: 21 U/L (ref 0–37)
Albumin: 4.3 g/dL (ref 3.5–5.2)
Alkaline Phosphatase: 89 U/L (ref 39–117)
BUN: 22 mg/dL (ref 6–23)
CO2: 33 mEq/L — ABNORMAL HIGH (ref 19–32)
Calcium: 9.4 mg/dL (ref 8.4–10.5)
Chloride: 99 mEq/L (ref 96–112)
Creatinine, Ser: 0.99 mg/dL (ref 0.40–1.20)
GFR: 54.96 mL/min — ABNORMAL LOW (ref >60.00)
Glucose, Bld: 86 mg/dL (ref 70–99)
Potassium: 3.3 mEq/L — ABNORMAL LOW (ref 3.5–5.1)
Sodium: 137 mEq/L (ref 135–145)
Total Bilirubin: 0.5 mg/dL (ref 0.2–1.2)
Total Protein: 6.7 g/dL (ref 6.0–8.3)

## 2021-11-15 LAB — HEMOGLOBIN A1C: Hgb A1c MFr Bld: 6.4 % (ref 4.6–6.5)

## 2021-11-15 MED ORDER — POTASSIUM CHLORIDE CRYS ER 20 MEQ PO TBCR: 20.0000 meq | EXTENDED_RELEASE_TABLET | ORAL | 1 refills | Status: DC

## 2021-11-15 MED ORDER — VERAPAMIL HCL ER 120 MG PO CP24: 120.0000 mg | ORAL_CAPSULE | Freq: Every day | ORAL | 1 refills | Status: DC

## 2021-11-15 MED ORDER — FUROSEMIDE 20 MG PO TABS: 20.0000 mg | ORAL_TABLET | ORAL | 3 refills | Status: DC

## 2021-11-15 MED ORDER — OZEMPIC (0.25 OR 0.5 MG/DOSE) 2 MG/1.5ML ~~LOC~~ SOPN: 0.2500 mg | PEN_INJECTOR | SUBCUTANEOUS | 3 refills | Status: DC

## 2021-11-15 NOTE — Assessment & Plan Note (Signed)
BP is low today and she is symptomatic. Reducing verelan to 120 mg daily .reduce furosemide to 2/weekly  contiue losartan.hct.

## 2021-11-15 NOTE — Assessment & Plan Note (Signed)
She has lost 12 lbs since starting ozempic.  Last a1c in nov was excellent. Reducing ozempic dose to  0.25 mg weekly continue farxiga 5 mg daily levemir 10 units and metformin 500 mg

## 2021-11-15 NOTE — Progress Notes (Signed)
Subjective:  Patient ID: Sandra Hendrix, female    DOB: 08-15-1946  Age: 76 y.o. MRN: [id number]  CC: The primary encounter diagnosis was Type 2 DM with CKD stage 3 and hypertension ([place]). Diagnoses of Chronic venous insufficiency, Essential hypertension, Atherosclerosis of renal artery (HCC), Stage 3a chronic kidney disease ([place]), Hepatic steatosis, and Overweight (BMI 25.0-29.9) were also pertinent to this visit.   This visit occurred during the SARS-CoV-2 public health emergency.  Safety protocols were in place, including screening questions prior to the visit, additional usage of staff PPE, and extensive cleaning of exam room while observing appropriate contact time as indicated for disinfecting solutions.    [name] presents for follow up on type 2 DM , hypertension and hyperlipidemia  Chief Complaint  Patient presents with   Follow-up    3 month follow up on diabetes   1) T2DM, CKD and hypertension   She  feels generally well,  is  exercising regularly  and losing  weight  (too much per her report).  She is  Checking  blood sugars less than once daily at variable times, usually only if she feels she may be having a hypoglycemic event. .  BS have been under 130 fasting and < 150 post prandially.  Denies any recent hypoglyemic events.  Taking   Levemir 10 units daily  Farxiga  5 mg    metformin XR   Ozempic  0.5 mg  and atorvastatin .   Per husband she is eating "all the wrong foods"  but she states that she is following a carbohydrate modified diet 6 days per week. Denies numbness, burning and tingling of extremities. Appetite has been diminished by Ozempic  and she is not happy about losing weight    12 lbs since November   BP hjas been on the low side,  she feels unsteady on feet but has not fallen.  2) Renal artery atherosclerosis Reviewed findings of prior CT scan today..  Patient is tolerating high potency statin therapy           Lab Results  Component Value  Date   LABMICR See below: 09/30/2019   MICROALBUR 0.7 03/18/2021   MICROALBUR <0.7 01/14/2020         Outpatient Medications Prior to Visit  Medication Sig Dispense Refill   acetaminophen (TYLENOL) 500 MG tablet Take 2 tablets (1,000 mg total) by mouth every 6 (six) hours. (Patient taking differently: Take 1,000 mg by mouth every 6 (six) hours as needed.) 65 tablet 1   albuterol (PROVENTIL HFA;VENTOLIN HFA) 108 (90 BASE) MCG/ACT inhaler Inhale 2 puffs into the lungs every 6 (six) hours as needed for wheezing or shortness of breath.      Ascorbic Acid 500 MG CHEW Chew by mouth.     atorvastatin (LIPITOR) 40 MG tablet TAKE 1/2 TABLET EVERY DAY AT 6 PM 45 tablet 1   azelastine (ASTELIN) 0.1 % nasal spray Place 1 spray into both nostrils daily.     benzonatate (TESSALON) 100 MG capsule Take 1 capsule (100 mg total) by mouth 3 (three) times daily as needed for cough. 21 capsule 0   Biotin 5000 MCG TABS Take by mouth 2 (two) times daily.      blood glucose meter kit and supplies Use to check blood sugar twice daily. ICD 10: E11.29 1 each 0   Cetirizine HCl 10 MG CAPS Take by mouth.     Cinnamon Bark POWD Take by mouth.  COLLAGEN PO Take by mouth 2 (two) times daily. For improved fingernail strength     dapagliflozin propanediol (FARXIGA) 10 MG TABS tablet Take 5 mg by mouth daily. Daily     docusate sodium (COLACE) 100 MG capsule Take 1 capsule (100 mg total) by mouth 2 (two) times daily. 20 capsule 0   fluticasone (FLONASE) 50 MCG/ACT nasal spray Place 1 spray into both nostrils daily. 48 g 1   fluticasone (FLOVENT DISKUS) 50 MCG/BLIST diskus inhaler Inhale 1 puff into the lungs 2 (two) times daily.     glucose blood (ACCU-CHEK GUIDE) test strip USE 1 STRIP TO TEST BLOOD SUGARS  TWICE DAILY 50 each 0   insulin detemir (LEVEMIR) 100 UNIT/ML injection INJECT 15 UNITS SUBCUTANEOUSLY AT BEDTIME. DISCARD VIAL 42 DAYS AFTER OPENING 30 mL 1   Lancet Devices (ACCU-CHEK SOFTCLIX) lancets Use to test  blood sugars 1 -2 times daily 1 each 3   losartan-hydrochlorothiazide (HYZAAR) 50-12.5 MG tablet TAKE 1 TABLET BY MOUTH  DAILY 90 tablet 3   Magnesium 250 MG TABS Take 1 tablet by mouth daily.     meclizine (ANTIVERT) 25 MG tablet Take 1 tablet (25 mg total) by mouth 3 (three) times daily as needed for dizziness. 30 tablet 3   metFORMIN (GLUCOPHAGE XR) 500 MG 24 hr tablet Take 1 tablet (500 mg total) by mouth daily with breakfast. 90 tablet 1   Multiple Vitamin (MULTIVITAMIN) tablet Take 1 tablet by mouth daily.     oxybutynin (DITROPAN-XL) 10 MG 24 hr tablet Take 1 tablet (10 mg total) by mouth daily. 90 tablet 3   pantoprazole (PROTONIX) 40 MG tablet Take 1 tablet by mouth twice daily 180 tablet 0   Probiotic Product (PROBIOTIC-10) CAPS Take 1 capsule by mouth daily.      sertraline (ZOLOFT) 50 MG tablet TAKE 1 TABLET EVERY DAY 90 tablet 3   tiZANidine (ZANAFLEX) 4 MG tablet tizanidine 4 mg tablet     vitamin B-12 (CYANOCOBALAMIN) 1000 MCG tablet Take 1,000 mcg by mouth daily.     Zinc 50 MG TABS Take by mouth daily.     furosemide (LASIX) 20 MG tablet Take 1 tablet (20 mg total) by mouth every other day. 45 tablet 3   potassium chloride SA (KLOR-CON) 20 MEQ tablet Take 1 tablet (20 mEq total) by mouth every other day. 45 tablet 1   Semaglutide,0.25 or 0.5MG/DOS, (OZEMPIC, 0.25 OR 0.5 MG/DOSE,) 2 MG/1.5ML SOPN Inject 0.5 mg into the skin once a week. 4.5 mL 5   verapamil (VERELAN PM) 180 MG 24 hr capsule TAKE 1 CAPSULE EVERY DAY 90 capsule 3   levocetirizine (XYZAL) 5 MG tablet Take 5 mg by mouth every evening. (Patient not taking: Reported on 11/15/2021)     No facility-administered medications prior to visit.    Review of Systems;  Patient denies headache, fevers, malaise, unintentional weight loss, skin rash, eye pain, sinus congestion and sinus pain, sore throat, dysphagia,  hemoptysis , cough, dyspnea, wheezing, chest pain, palpitations, orthopnea, edema, abdominal pain, nausea, melena,  diarrhea, constipation, flank pain, dysuria, hematuria, urinary  Frequency, nocturia, numbness, tingling, seizures,  Focal weakness, Loss of consciousness,  Tremor, insomnia, depression, anxiety, and suicidal ideation.      Objective:  BP 92/60 (BP Location: Left Arm, Patient Position: Sitting, Cuff Size: Normal)    Pulse 70    Temp 97.8 F (36.6 C) (Oral)    Ht '5\' 1"'  (1.549 m)    Wt 138 lb 9.6 oz (62.9  kg)    SpO2 98%    BMI 26.19 kg/m   BP Readings from Last 3 Encounters:  11/15/21 92/60  09/29/21 132/64  08/12/21 (!) 108/58    Wt Readings from Last 3 Encounters:  11/15/21 138 lb 9.6 oz (62.9 kg)  09/29/21 142 lb 9.6 oz (64.7 kg)  08/12/21 150 lb (68 kg)    General appearance: alert, cooperative and appears stated age Ears: normal TM's and external ear canals both ears Throat: lips, mucosa, and tongue normal; teeth and gums normal Neck: no adenopathy, no carotid bruit, supple, symmetrical, trachea midline and thyroid not enlarged, symmetric, no tenderness/mass/nodules Back: symmetric, no curvature. ROM normal. No CVA tenderness. Lungs: clear to auscultation bilaterally Heart: regular rate and rhythm, S1, S2 normal, no murmur, click, rub or gallop Abdomen: soft, non-tender; bowel sounds normal; no masses,  no organomegaly Pulses: 2+ and symmetric Skin: Skin color, texture, turgor normal. No rashes or lesions Lymph nodes: Cervical, supraclavicular, and axillary nodes normal.  Lab Results  Component Value Date   HGBA1C 6.7 (H) 08/12/2021   HGBA1C 6.9 (H) 03/18/2021   HGBA1C 7.6 (H) 12/10/2020    Lab Results  Component Value Date   CREATININE 0.89 08/12/2021   CREATININE 0.95 03/18/2021   CREATININE 0.90 12/10/2020    Lab Results  Component Value Date   WBC 4.8 12/08/2020   HGB 12.0 12/08/2020   HCT 36.2 12/08/2020   PLT 270 12/08/2020   GLUCOSE 107 (H) 08/12/2021   CHOL 186 03/18/2021   TRIG 92.0 03/18/2021   HDL 87.20 03/18/2021   LDLDIRECT 80.0 02/22/2017    LDLCALC 80 03/18/2021   ALT 17 08/12/2021   AST 22 08/12/2021   NA 139 08/12/2021   K 3.4 (L) 08/12/2021   CL 101 08/12/2021   CREATININE 0.89 08/12/2021   BUN 20 08/12/2021   CO2 30 08/12/2021   TSH 2.95 12/10/2020   INR 1.1 01/23/2009   HGBA1C 6.7 (H) 08/12/2021   MICROALBUR 0.7 03/18/2021    Pulmonary Function Test [facility] Only  Result Date: 03/17/2021 Spirometry Data Is Acceptable and Reproducible No obvious evidence of Obstructive Airways disease or Restrictive Lung disease Consider outpatient follow up with Pulmonary if needed. Clinical Correlation Advised    Assessment & Plan:   Problem List Items Addressed This Visit     Atherosclerosis of renal artery [facility])    Reviewed with patient.  She is tolerating asa, atorvastatin and BP is at goal       Relevant Medications   furosemide (LASIX) 20 MG tablet   verapamil (VERELAN PM) 120 MG 24 hr capsule   Chronic venous insufficiency    Advised to reduce furosemide dose to 2/weekly      Relevant Medications   furosemide (LASIX) 20 MG tablet   verapamil (VERELAN PM) 120 MG 24 hr capsule   CKD (chronic kidney disease) stage 3, GFR 30-59 ml/min (HCC)    Stable for over 4 years .  Monitored by Nephrology.. She is avoiding nephrotoxic agents and taking an ARB   Lab Results  Component Value Date   CREATININE 0.89 08/12/2021   Lab Results  Component Value Date   NA 139 08/12/2021   K 3.4 (L) 08/12/2021   CL 101 08/12/2021   CO2 30 08/12/2021        Essential hypertension    BP is low today and she is symptomatic. Reducing verelan to 120 mg daily .reduce furosemide to 2/weekly  contiue losartan.hct.  Relevant Medications   furosemide (LASIX) 20 MG tablet   verapamil (VERELAN PM) 120 MG 24 hr capsule   Hepatic steatosis    Presumed by ultrasound changes and serologies negative for autoimmune causes of hepatitis. liver enzymes have been  normal and all modifiable risk factors including obesity, diabetes and  hyperlipidemia have been addressed . Continue metrformin and atorvastatin.  She has achieved significant weight loss with initiation of  Ozempic   Lab Results  Component Value Date   ALT 17 08/12/2021   AST 22 08/12/2021   ALKPHOS 77 08/12/2021   BILITOT 0.5 08/12/2021          Overweight (BMI 25.0-29.9)    I have congratulated her in reduction of   BMI and encouraged her to maintain current weight.       Type 2 DM with CKD stage 3 and hypertension ([place]) - Primary    She has lost 12 lbs since starting ozempic.  Last a1c in nov was excellent. Reducing ozempic dose to  0.25 mg weekly continue farxiga 5 mg daily levemir 10 units and metformin 500 mg       Relevant Medications   furosemide (LASIX) 20 MG tablet   Semaglutide,0.25 or 0.5MG/DOS, (OZEMPIC, 0.25 OR 0.5 MG/DOSE,) 2 MG/1.5ML SOPN   verapamil (VERELAN PM) 120 MG 24 hr capsule   Other Relevant Orders   Comp Met (CMET)   Lipid Profile   HgB A1c   Microalbumin / creatinine urine ratio    I spent 30 minutes dedicated to the care of this patient on the date of this encounter to include pre-visit review of patient's medical history,  most recent imaging studies, Face-to-face time with the patient , and post visit ordering of testing and therapeutics.    Follow-up: No follow-ups on file.   Crecencio Mc, MD

## 2021-11-15 NOTE — Assessment & Plan Note (Signed)
Advised to reduce furosemide dose to 2/weekly

## 2021-11-15 NOTE — Assessment & Plan Note (Signed)
Presumed by ultrasound changes and serologies negative for autoimmune causes of hepatitis. liver enzymes have been  normal and all modifiable risk factors including obesity, diabetes and hyperlipidemia have been addressed . Continue metrformin and atorvastatin.  She has achieved significant weight loss with initiation of  Ozempic   Lab Results  Component Value Date   ALT 17 08/12/2021   AST 22 08/12/2021   ALKPHOS 77 08/12/2021   BILITOT 0.5 08/12/2021

## 2021-11-15 NOTE — Assessment & Plan Note (Signed)
I have congratulated her in reduction of   BMI and encouraged her to maintain current weight.

## 2021-11-15 NOTE — Assessment & Plan Note (Addendum)
Stable for over 4 years .  Monitored by Nephrology.. She is avoiding nephrotoxic agents and taking an ARB   Lab Results  Component Value Date   CREATININE 0.89 08/12/2021   Lab Results  Component Value Date   NA 139 08/12/2021   K 3.4 (L) 08/12/2021   CL 101 08/12/2021   CO2 30 08/12/2021

## 2021-11-15 NOTE — Assessment & Plan Note (Signed)
Reviewed with patient.  She is tolerating asa, atorvastatin and BP is at goal

## 2021-11-15 NOTE — Patient Instructions (Addendum)
You can reduce the dose of Ozempic to 0.25 mg weekly to avoid losing more weight   We can extend the interval to every 10 days if you continue to lose weight   Your BP is too low.    I have reduced the verapamil dose fro m180 mg daily to 120 mg daily   Reduce furosemide dose to "as needed "  not more than every 2 days ( 2 times per week )    Reduce the potassium tablet on only the day you are taking furosemide   You can add a little salt to your diet for the next few days to help bring up your blood pressure

## 2021-11-24 ENCOUNTER — Ambulatory Visit: Payer: Medicare PPO

## 2021-12-09 ENCOUNTER — Ambulatory Visit: Payer: Medicare PPO

## 2021-12-17 ENCOUNTER — Encounter
Admission: RE | Disposition: A | Discharge status: Home / Self Care | Payer: Self-pay | Source: Home / Self Care | Attending: Gastroenterology

## 2021-12-17 ENCOUNTER — Ambulatory Visit: Payer: Medicare PPO | Admitting: Anesthesiology

## 2021-12-17 ENCOUNTER — Encounter: Payer: Self-pay | Admitting: Gastroenterology

## 2021-12-17 ENCOUNTER — Ambulatory Visit
Admission: RE | Admit: 2021-12-17 | Discharge: 2021-12-17 | Disposition: A | Discharge status: Home / Self Care | Payer: Medicare PPO | Attending: Gastroenterology | Admitting: Gastroenterology

## 2021-12-17 ENCOUNTER — Other Ambulatory Visit: Payer: Self-pay

## 2021-12-17 DIAGNOSIS — Z1211 Encounter for screening for malignant neoplasm of colon: Secondary | ICD-10-CM | POA: Diagnosis not present

## 2021-12-17 DIAGNOSIS — Z9049 Acquired absence of other specified parts of digestive tract: Secondary | ICD-10-CM | POA: Diagnosis not present

## 2021-12-17 DIAGNOSIS — K5730 Diverticulosis of large intestine without perforation or abscess without bleeding: Secondary | ICD-10-CM | POA: Insufficient documentation

## 2021-12-17 DIAGNOSIS — Z09 Encounter for follow-up examination after completed treatment for conditions other than malignant neoplasm: Secondary | ICD-10-CM | POA: Insufficient documentation

## 2021-12-17 DIAGNOSIS — I129 Hypertensive chronic kidney disease with stage 1 through stage 4 chronic kidney disease, or unspecified chronic kidney disease: Secondary | ICD-10-CM | POA: Diagnosis not present

## 2021-12-17 DIAGNOSIS — F32A Depression, unspecified: Secondary | ICD-10-CM | POA: Insufficient documentation

## 2021-12-17 DIAGNOSIS — Z794 Long term (current) use of insulin: Secondary | ICD-10-CM | POA: Diagnosis not present

## 2021-12-17 DIAGNOSIS — Z8719 Personal history of other diseases of the digestive system: Secondary | ICD-10-CM | POA: Diagnosis not present

## 2021-12-17 DIAGNOSIS — F419 Anxiety disorder, unspecified: Secondary | ICD-10-CM | POA: Diagnosis not present

## 2021-12-17 DIAGNOSIS — K648 Other hemorrhoids: Secondary | ICD-10-CM | POA: Diagnosis not present

## 2021-12-17 DIAGNOSIS — E1122 Type 2 diabetes mellitus with diabetic chronic kidney disease: Secondary | ICD-10-CM | POA: Diagnosis not present

## 2021-12-17 DIAGNOSIS — Z8542 Personal history of malignant neoplasm of other parts of uterus: Secondary | ICD-10-CM | POA: Diagnosis not present

## 2021-12-17 DIAGNOSIS — E785 Hyperlipidemia, unspecified: Secondary | ICD-10-CM | POA: Insufficient documentation

## 2021-12-17 DIAGNOSIS — K760 Fatty (change of) liver, not elsewhere classified: Secondary | ICD-10-CM | POA: Diagnosis not present

## 2021-12-17 DIAGNOSIS — K219 Gastro-esophageal reflux disease without esophagitis: Secondary | ICD-10-CM | POA: Diagnosis not present

## 2021-12-17 DIAGNOSIS — N183 Chronic kidney disease, stage 3 unspecified: Secondary | ICD-10-CM | POA: Insufficient documentation

## 2021-12-17 DIAGNOSIS — D120 Benign neoplasm of cecum: Secondary | ICD-10-CM | POA: Diagnosis not present

## 2021-12-17 DIAGNOSIS — K635 Polyp of colon: Secondary | ICD-10-CM

## 2021-12-17 DIAGNOSIS — K640 First degree hemorrhoids: Secondary | ICD-10-CM | POA: Diagnosis not present

## 2021-12-17 DIAGNOSIS — Z800 Family history of malignant neoplasm of digestive organs: Secondary | ICD-10-CM | POA: Diagnosis not present

## 2021-12-17 DIAGNOSIS — D126 Benign neoplasm of colon, unspecified: Secondary | ICD-10-CM | POA: Diagnosis not present

## 2021-12-17 DIAGNOSIS — G4733 Obstructive sleep apnea (adult) (pediatric): Secondary | ICD-10-CM | POA: Insufficient documentation

## 2021-12-17 HISTORY — PX: COLONOSCOPY WITH PROPOFOL: SHX5780

## 2021-12-17 LAB — GLUCOSE, CAPILLARY: Glucose-Capillary: 113 mg/dL — ABNORMAL HIGH (ref 70–99)

## 2021-12-17 SURGERY — COLONOSCOPY WITH PROPOFOL
Anesthesia: General

## 2021-12-17 MED ORDER — LIDOCAINE HCL (CARDIAC) PF 100 MG/5ML IV SOSY
PREFILLED_SYRINGE | INTRAVENOUS | Status: DC | PRN
  Administered 2021-12-17: 60 mg via INTRAVENOUS

## 2021-12-17 MED ORDER — PROPOFOL 500 MG/50ML IV EMUL
INTRAVENOUS | Status: AC
  Filled 2021-12-17: qty 100

## 2021-12-17 MED ORDER — PROPOFOL 10 MG/ML IV BOLUS
INTRAVENOUS | Status: DC | PRN
  Administered 2021-12-17: 25 mg via INTRAVENOUS
  Administered 2021-12-17: 50 mg via INTRAVENOUS
  Administered 2021-12-17: 25 mg via INTRAVENOUS

## 2021-12-17 MED ORDER — PROPOFOL 500 MG/50ML IV EMUL
INTRAVENOUS | Status: DC | PRN
  Administered 2021-12-17: 125 ug/kg/min via INTRAVENOUS

## 2021-12-17 MED ORDER — SODIUM CHLORIDE 0.9 % IV SOLN: INTRAVENOUS | Status: DC

## 2021-12-17 NOTE — Interval H&P Note (Signed)
History and Physical Interval Note: Preprocedure H&P from 12/17/21 ? was reviewed and there was no interval change after seeing and examining the patient.  Written consent was obtained from the patient after discussion of risks, benefits, and alternatives. Patient has consented to proceed with Colonoscopy with possible intervention ? ? ?12/17/2021 ?9:21 AM ? ?Sandra Hendrix  has presented today for surgery, with the diagnosis of family hax of clon cancer.  The various methods of treatment have been discussed with the patient and family. After consideration of risks, benefits and other options for treatment, the patient has consented to  Procedure(s) with comments: ?COLONOSCOPY WITH PROPOFOL (N/A) - DM as a surgical intervention.  The patient's history has been reviewed, patient examined, no change in status, stable for surgery.  I have reviewed the patient's chart and labs.  Questions were answered to the patient's satisfaction.   ? ? ?Annamaria Helling ? ? ?

## 2021-12-17 NOTE — Anesthesia Postprocedure Evaluation (Signed)
Anesthesia Post Note ? ?Patient: Sandra Hendrix ? ?Procedure(s) Performed: COLONOSCOPY WITH PROPOFOL ? ?Patient location during evaluation: PACU ?Anesthesia Type: General ?Level of consciousness: awake and alert, oriented and patient cooperative ?Pain management: pain level controlled ?Vital Signs Assessment: post-procedure vital signs reviewed and stable ?Respiratory status: spontaneous breathing, nonlabored ventilation and respiratory function stable ?Cardiovascular status: blood pressure returned to baseline and stable ?Postop Assessment: adequate PO intake ?Anesthetic complications: no ? ? ?No notable events documented. ? ? ?Last Vitals:  ?Vitals:  ? 12/17/21 1013 12/17/21 1023  ?BP: 125/62 118/81  ?Pulse: 60 (!) 58  ?Resp: 14 15  ?Temp:    ?SpO2: 100% 100%  ?  ?Last Pain:  ?Vitals:  ? 12/17/21 1023  ?TempSrc:   ?PainSc: 0-No pain  ? ? ?  ?  ?  ?  ?  ?  ? ?Darrin Nipper ? ? ? ? ?

## 2021-12-17 NOTE — Anesthesia Preprocedure Evaluation (Addendum)
Anesthesia Evaluation  ?Patient identified by MRN, date of birth, ID band ?Patient awake ? ? ? ?Reviewed: ?Allergy & Precautions, NPO status , Patient's Chart, lab work & pertinent test results ? ?History of Anesthesia Complications ?(+) DIFFICULT AIRWAY and history of anesthetic complications ? ?Airway ?Mallampati: I ? ? ?Neck ROM: Full ? ? ? Dental ?no notable dental hx. ? ?  ?Pulmonary ?sleep apnea and Continuous Positive Airway Pressure Ventilation ,  ?  ?Pulmonary exam normal ?breath sounds clear to auscultation ? ? ? ? ? ? Cardiovascular ?hypertension, + Peripheral Vascular Disease  ?Normal cardiovascular exam ?Rhythm:Regular Rate:Normal ? ?Echo 03/09/21:  ?Normal Stress Echocardiogram  ?NORMAL RIGHT VENTRICULAR SYSTOLIC FUNCTION  ?MODERATE VALVULAR REGURGITATION   ?NO VALVULAR STENOSIS NOTED  ? ?Myocardial perfusion 07/29/19:  ?- There was no ST segment deviation noted during stress. ?- No T wave inversion was noted during stress. ?- The study is normal. ?- This is a low risk study. ?- The left ventricular ejection fraction is normal (55-65%). ?  ?Neuro/Psych ?PSYCHIATRIC DISORDERS Anxiety Depression negative neurological ROS ?   ? GI/Hepatic ?hiatal hernia, GERD ([place] esophagus)  ,Fatty liver ?  ?Endo/Other  ?diabetes, Type 2, Insulin Dependent ? Renal/GU ?Renal disease (stage III CKD)  ? ?  ?Musculoskeletal ? ? Abdominal ?  ?Peds ? Hematology ?negative hematology ROS ?(+)   ?Anesthesia Other Findings ?Cardiology note 09/07/21:  ?76 year old female with multiple cardiovascular risk factors including a history of hypertension, hyperlipidemia, type 2 diabetes, OSA on CPAP, CKD, and chronic venous insufficiency, referred by her primary care provider for evaluation of chest pain. The patient was evaluated for chest pain at [facility] ER on 12/08/2020, ruled out for ACS with normal ECG and normal high-sensitivity troponin x2. Her work-up was overall unremarkable. She has since resumed  exercising at the [facility] 3 times a week without recurrent chest pain. Stress echocardiogram revealed normal left ventricular function, normal ECG without evidence of ischemia. ? ?Plan  ?1. Continue current medications ?2. Continue atorvastatin for hyperlipidemia management ?3. Recommend heart healthy diet ?4. Defer further cardiac diagnostics at this time ?5. Continue regular exercise ?6. Return to clinic for follow up in 6 months ? ? Reproductive/Obstetrics ?Uterine CA ? ?  ? ? ? ? ? ? ? ? ? ? ? ? ? ?  ?  ? ? ? ? ? ? ? ?Anesthesia Physical ?Anesthesia Plan ? ?ASA: 3 ? ?Anesthesia Plan: General  ? ?Post-op Pain Management:   ? ?Induction: Intravenous ? ?PONV Risk Score and Plan: 3 and Propofol infusion, TIVA and Treatment may vary due to age or medical condition ? ?Airway Management Planned: Natural Airway ? ?Additional Equipment:  ? ?Intra-op Plan:  ? ?Post-operative Plan:  ? ?Informed Consent: I have reviewed the patients History and Physical, chart, labs and discussed the procedure including the risks, benefits and alternatives for the proposed anesthesia with the patient or authorized representative who has indicated his/her understanding and acceptance.  ? ? ? ? ? ?Plan Discussed with: CRNA ? ?Anesthesia Plan Comments: (LMA/GETA backup discussed.  Patient consented for risks of anesthesia including but not limited to:  ?- adverse reactions to medications ?- damage to eyes, teeth, lips or other oral mucosa ?- nerve damage due to positioning  ?- sore throat or hoarseness ?- damage to heart, brain, nerves, lungs, other parts of body or loss of life ? ?Informed patient about role of CRNA in peri- and intra-operative care.  Patient voiced understanding.)  ? ? ? ? ? ? ?Anesthesia  Quick Evaluation ? ?

## 2021-12-17 NOTE — Op Note (Signed)
[facility] ?Gastroenterology ?Patient Name: Sandra Hendrix ?Procedure Date: 12/17/2021 9:22 AM ?MRN: [id number] ?Account #: [id number] ?Date of Birth: [birth year] ?Admit Type: Outpatient ?Age: 76 ?Room: [facility] ENDO ROOM 3 ?Gender: Female ?Note Status: Finalized ?Instrument Name: Colonoscope [id number] ?Procedure:             Colonoscopy ?Indications:           Family history of colon cancer in multiple  ?                       second-degree relatives ?Providers:             Annamaria Helling DO, DO ?Referring MD:          Deborra Medina, MD (Referring MD) ?Medicines:             Monitored Anesthesia Care ?Complications:         No immediate complications. Estimated blood loss:  ?                       Minimal. ?Procedure:             Pre-Anesthesia Assessment: ?                       - Prior to the procedure, a History and Physical was  ?                       performed, and patient medications and allergies were  ?                       reviewed. The patient is competent. The risks and  ?                       benefits of the procedure and the sedation options and  ?                       risks were discussed with the patient. All questions  ?                       were answered and informed consent was obtained.  ?                       Patient identification and proposed procedure were  ?                       verified by the physician, the nurse, the anesthetist  ?                       and the technician in the endoscopy suite. Mental  ?                       Status Examination: alert and oriented. Airway  ?                       Examination: normal oropharyngeal airway and neck  ?                       mobility. Respiratory Examination: clear to  ?  auscultation. CV Examination: RRR, no murmurs, no S3  ?                       or S4. Prophylactic Antibiotics: The patient does not  ?                       require prophylactic antibiotics. Prior  ?                        Anticoagulants: The patient has taken no previous  ?                       anticoagulant or antiplatelet agents. ASA Grade  ?                       Assessment: III - A patient with severe systemic  ?                       disease. After reviewing the risks and benefits, the  ?                       patient was deemed in satisfactory condition to  ?                       undergo the procedure. The anesthesia plan was to use  ?                       monitored anesthesia care (MAC). Immediately prior to  ?                       administration of medications, the patient was  ?                       re-assessed for adequacy to receive sedatives. The  ?                       heart rate, respiratory rate, oxygen saturations,  ?                       blood pressure, adequacy of pulmonary ventilation, and  ?                       response to care were monitored throughout the  ?                       procedure. The physical status of the patient was  ?                       re-assessed after the procedure. ?                       After obtaining informed consent, the colonoscope was  ?                       passed under direct vision. Throughout the procedure,  ?                       the patient's blood pressure, pulse, and oxygen  ?  saturations were monitored continuously. The  ?                       Colonoscope was introduced through the anus and  ?                       advanced to the the terminal ileum, with  ?                       identification of the appendiceal orifice and IC  ?                       valve. The colonoscopy was performed without  ?                       difficulty. The patient tolerated the procedure well.  ?                       The quality of the bowel preparation was evaluated  ?                       using the BBPS [facility] Bowel Preparation Scale) with  ?                       scores of: Right Colon = 2 (minor amount of residual  ?                       staining, small  fragments of stool and/or opaque  ?                       liquid, but mucosa seen well), Transverse Colon = 3  ?                       (entire mucosa seen well with no residual staining,  ?                       small fragments of stool or opaque liquid) and Left  ?                       Colon = 2 (minor amount of residual staining, small  ?                       fragments of stool and/or opaque liquid, but mucosa  ?                       seen well). The total BBPS score equals 7. The quality  ?                       of the bowel preparation was good. The terminal ileum,  ?                       ileocecal valve, appendiceal orifice, and rectum were  ?                       photographed. ?Findings: ?     The perianal and digital rectal examinations were normal. Pertinent  ?     negatives include normal sphincter  tone. ?     The terminal ileum appeared normal. Estimated blood loss: none. ?     A 4 to 5 mm polyp was found in the cecum. The polyp was sessile. The  ?     polyp was removed with a cold snare. Resection and retrieval were  ?     complete. Estimated blood loss was minimal. ?     A 1 to 2 mm polyp was found in the sigmoid colon. The polyp was sessile.  ?     The polyp was removed with a cold biopsy forceps. Resection and  ?     retrieval were complete. Estimated blood loss was minimal. ?     A few small-mouthed diverticula were found in the left colon. Estimated  ?     blood loss: none. ?     Non-bleeding internal hemorrhoids were found during retroflexion. The  ?     hemorrhoids were Grade I (internal hemorrhoids that do not prolapse).  ?     Estimated blood loss: none. ?     The exam was otherwise without abnormality on direct and retroflexion  ?     views. ?Impression:            - The examined portion of the ileum was normal. ?                       - One 4 to 5 mm polyp in the cecum, removed with a  ?                       cold snare. Resected and retrieved. ?                       - One 1 to 2 mm polyp  in the sigmoid colon, removed  ?                       with a cold biopsy forceps. Resected and retrieved. ?                       - Diverticulosis in the left colon. ?                       - Non-bleeding internal hemorrhoids. ?                       - The examination was otherwise normal on direct and  ?                       retroflexion views. ?Recommendation:        - Discharge patient to home. ?                       - Resume previous diet. ?                       - Continue present medications. ?                       - Await pathology results. ?                       - Repeat colonoscopy for surveillance based on  ?  pathology results. ?                       - Return to referring physician as previously  ?                       scheduled. ?                       - The findings and recommendations were discussed with  ?                       the patient. ?Procedure Code(s):     --- Professional --- ?                       [phone], Colonoscopy, flexible; with removal of  ?                       tumor(s), polyp(s), or other lesion(s) by snare  ?                       technique ?                       45380, 59, Colonoscopy, flexible; with biopsy, single  ?                       or multiple ?Diagnosis Code(s):     --- Professional --- ?                       K64.0, First degree hemorrhoids ?                       K63.5, Polyp of colon ?                       Z80.0, Family history of malignant neoplasm of  ?                       digestive organs ?                       K57.30, Diverticulosis of large intestine without  ?                       perforation or abscess without bleeding ?CPT copyright 2019 American Medical Association. All rights reserved. ?The codes documented in this report are preliminary and upon coder review may  ?be revised to meet current compliance requirements. ?Attending Participation: ?     I personally performed the entire procedure. ?Volney American, DO ?Annamaria Helling DO, DO ?12/17/2021 10:08:03 AM ?This report has been signed electronically. ?Number of Addenda: 0 ?Note Initiated On: 12/17/2021 9:22 AM ?Scope Withdrawal Time: 0 hours 15 minutes 0 seconds  ?Total Procedu

## 2021-12-17 NOTE — H&P (Signed)
? ?Pre-Procedure H&P ?  ?Patient ID: Sandra Hendrix is a 76 y.o. female. ? ?Gastroenterology Provider: Annamaria Helling, DO ? ?Referring Provider: Stephens November, NP ?PCP: Crecencio Mc, MD ? ?Date: 12/17/2021 ? ?HPI ?Sandra Hendrix is a 76 y.o. female who presents today for Colonoscopy for surveillance- fhx of colon cancer. ? ?Underwent surveillance colonoscopy last year, however, poor prep with recommended 1 year repeat. ? ?Deals with chronic constipation, including this past week. ?BM have been w/o blood, hematochezia, or hemorrhoids. She is s/p cholecystectomy ? ?Recent labs- hgb 11.4, mcv 90 plt 299; cr 1.0 ? ?05/2016 colon- diverticulosis, hemorrhoids ? ?Fhx for CRC- Cousin and MGF. Father with GB cancer, maternal aunt stomach cancer ? ?Past Medical History:  ?Diagnosis Date  ? Allergy   ? Anxiety   ? Arthritis   ? Barrett's esophagus determined by endoscopy   ? Cancer [facility])   ? Uterine Cancer  ? Chronic kidney disease   ? Chronic venous insufficiency   ? COVID-19 virus infection 07/21/2021  ? NOTIFIED ON OCT 26 . FEVERS BODY ACHES,  HEADACHES.  SENDING [place]  ? Depression   ? Diabetes mellitus without complication ([place])   ? Difficult intubation   ? "not sure what happened,asleep was told had problem with intubation"  ? Diverticulosis   ? Fatty liver   ? GERD (gastroesophageal reflux disease)   ? History of bronchitis   ? History of hiatal hernia   ? Hyperlipidemia   ? Hypertension   ? Irritable bowel syndrome with constipation   ? Sleep apnea   ? Use C- PAP  ? Urinary incontinence   ? Varicose veins   ? ? ?Past Surgical History:  ?Procedure Laterality Date  ? CARDIAC CATHETERIZATION    ? CHOLECYSTECTOMY    ? COLONOSCOPY WITH PROPOFOL N/A 06/14/2016  ? Procedure: COLONOSCOPY WITH PROPOFOL;  Surgeon: Lollie Sails, MD;  Location: [facility] ENDOSCOPY;  Service: Endoscopy;  Laterality: N/A;  ? COLONOSCOPY WITH PROPOFOL N/A 12/18/2020  ? Procedure: COLONOSCOPY WITH PROPOFOL;  Surgeon:  Lesly Rubenstein, MD;  Location: [facility] ENDOSCOPY;  Service: Endoscopy;  Laterality: N/A;  ? ESOPHAGOGASTRODUODENOSCOPY N/A 12/18/2020  ? Procedure: ESOPHAGOGASTRODUODENOSCOPY (EGD);  Surgeon: Lesly Rubenstein, MD;  Location: [facility] ENDOSCOPY;  Service: Endoscopy;  Laterality: N/A;  ? ESOPHAGOGASTRODUODENOSCOPY (EGD) WITH PROPOFOL N/A 06/14/2016  ? Procedure: ESOPHAGOGASTRODUODENOSCOPY (EGD) WITH PROPOFOL;  Surgeon: Lollie Sails, MD;  Location: [facility] ENDOSCOPY;  Service: Endoscopy;  Laterality: N/A;  ? ESOPHAGOGASTRODUODENOSCOPY (EGD) WITH PROPOFOL N/A 06/12/2017  ? Procedure: ESOPHAGOGASTRODUODENOSCOPY (EGD) WITH PROPOFOL;  Surgeon: Lollie Sails, MD;  Location: [facility] ENDOSCOPY;  Service: Endoscopy;  Laterality: N/A;  ? ESOPHAGOGASTRODUODENOSCOPY (EGD) WITH PROPOFOL N/A 08/25/2017  ? Procedure: ESOPHAGOGASTRODUODENOSCOPY (EGD) WITH PROPOFOL;  Surgeon: Lollie Sails, MD;  Location: [facility] ENDOSCOPY;  Service: Endoscopy;  Laterality: N/A;  ? HYSTEROSCOPY WITH D & C N/A 01/02/2017  ? Procedure: DILATATION AND CURETTAGE /HYSTEROSCOPY;  Surgeon: Brayton Mars, MD;  Location: [facility] ORS;  Service: Gynecology;  Laterality: N/A;  ? JOINT REPLACEMENT Right 2007  ? Total Knee Replacement  ? JOINT REPLACEMENT Left 2004  ? Total Knee Replacement  ? LAPAROSCOPIC HYSTERECTOMY Bilateral 03/01/2017  ? Procedure: HYSTERECTOMY TOTAL LAPAROSCOPIC BSO;  Surgeon: Mellody Drown, MD;  Location: [facility] ORS;  Service: Gynecology;  Laterality: Bilateral;  ? ROTATOR CUFF REPAIR Right   ? SENTINEL NODE BIOPSY N/A 03/01/2017  ? Procedure: SENTINEL NODE INJECTION AND BIOPSY;  Surgeon: Mellody Drown, MD;  Location: [facility] ORS;  Service: Gynecology;  Laterality: N/A;  ? TONSILLECTOMY    ? as a child  ? TUBAL LIGATION    ? UPPER GI ENDOSCOPY    ? x2  ? ? ?Family History ?Fhx for CRC: Cousin and MGF. Father with GB cancer, maternal aunt stomach cancer ?No other h/o GI disease or malignancy ? ?Review of Systems  ?Constitutional:  Negative  for activity change, appetite change, chills, diaphoresis, fatigue, fever and unexpected weight change.  ?HENT:  Negative for trouble swallowing and voice change.   ?Respiratory:  Negative for shortness of breath and wheezing.   ?Cardiovascular:  Negative for chest pain, palpitations and leg swelling.  ?Gastrointestinal:  Positive for constipation. Negative for abdominal distention, abdominal pain, anal bleeding, blood in stool, diarrhea, nausea, rectal pain and vomiting.  ?Musculoskeletal:  Negative for arthralgias and myalgias.  ?Skin:  Negative for color change and pallor.  ?Neurological:  Negative for dizziness, syncope and weakness.  ?Psychiatric/Behavioral:  Negative for confusion.   ?All other systems reviewed and are negative.  ? ?Medications ?No current facility-administered medications on file prior to encounter.  ? ?Current Outpatient Medications on File Prior to Encounter  ?Medication Sig Dispense Refill  ? albuterol (PROVENTIL HFA;VENTOLIN HFA) 108 (90 BASE) MCG/ACT inhaler Inhale 2 puffs into the lungs every 6 (six) hours as needed for wheezing or shortness of breath.     ? Ascorbic Acid 500 MG CHEW Chew by mouth.    ? atorvastatin (LIPITOR) 40 MG tablet TAKE 1/2 TABLET EVERY DAY AT 6 PM 45 tablet 1  ? Biotin 5000 MCG TABS Take by mouth 2 (two) times daily.     ? Cetirizine HCl 10 MG CAPS Take by mouth.    ? Cinnamon Bark POWD Take by mouth.    ? COLLAGEN PO Take by mouth 2 (two) times daily. For improved fingernail strength    ? dapagliflozin propanediol (FARXIGA) 10 MG TABS tablet Take 5 mg by mouth daily. Daily    ? docusate sodium (COLACE) 100 MG capsule Take 1 capsule (100 mg total) by mouth 2 (two) times daily. 20 capsule 0  ? fluticasone (FLONASE) 50 MCG/ACT nasal spray Place 1 spray into both nostrils daily. 48 g 1  ? losartan-hydrochlorothiazide (HYZAAR) 50-12.5 MG tablet TAKE 1 TABLET BY MOUTH  DAILY 90 tablet 3  ? Magnesium 250 MG TABS Take 1 tablet by mouth daily.    ? metFORMIN (GLUCOPHAGE  XR) 500 MG 24 hr tablet Take 1 tablet (500 mg total) by mouth daily with breakfast. 90 tablet 1  ? Multiple Vitamin (MULTIVITAMIN) tablet Take 1 tablet by mouth daily.    ? oxybutynin (DITROPAN-XL) 10 MG 24 hr tablet Take 1 tablet (10 mg total) by mouth daily. 90 tablet 3  ? pantoprazole (PROTONIX) 40 MG tablet Take 1 tablet by mouth twice daily 180 tablet 0  ? Probiotic Product (PROBIOTIC-10) CAPS Take 1 capsule by mouth daily.     ? sertraline (ZOLOFT) 50 MG tablet TAKE 1 TABLET EVERY DAY 90 tablet 3  ? tiZANidine (ZANAFLEX) 4 MG tablet tizanidine 4 mg tablet    ? vitamin B-12 (CYANOCOBALAMIN) 1000 MCG tablet Take 1,000 mcg by mouth daily.    ? Zinc 50 MG TABS Take by mouth daily.    ? acetaminophen (TYLENOL) 500 MG tablet Take 2 tablets (1,000 mg total) by mouth every 6 (six) hours. (Patient taking differently: Take 1,000 mg by mouth every 6 (six) hours as needed.) 65 tablet 1  ? azelastine (ASTELIN) 0.1 %  nasal spray Place 1 spray into both nostrils daily.    ? benzonatate (TESSALON) 100 MG capsule Take 1 capsule (100 mg total) by mouth 3 (three) times daily as needed for cough. 21 capsule 0  ? blood glucose meter kit and supplies Use to check blood sugar twice daily. ICD 10: E11.29 1 each 0  ? fluticasone (FLOVENT DISKUS) 50 MCG/BLIST diskus inhaler Inhale 1 puff into the lungs 2 (two) times daily.    ? glucose blood (ACCU-CHEK GUIDE) test strip USE 1 STRIP TO TEST BLOOD SUGARS  TWICE DAILY 50 each 0  ? Lancet Devices (ACCU-CHEK SOFTCLIX) lancets Use to test blood sugars 1 -2 times daily 1 each 3  ? meclizine (ANTIVERT) 25 MG tablet Take 1 tablet (25 mg total) by mouth 3 (three) times daily as needed for dizziness. 30 tablet 3  ? ? ?Pertinent medications related to GI and procedure were reviewed by me with the patient prior to the procedure ? ? ?Current Facility-Administered Medications:  ?  0.9 %  sodium chloride infusion, , Intravenous, Continuous, Annamaria Helling, DO, Last Rate: 20 mL/hr at 12/17/21  0902, New Bag at 12/17/21 0902 ? sodium chloride 20 mL/hr at 12/17/21 5001  ?  ?  ? ?Allergies  ?Allergen Reactions  ? Clindamycin/Lincomycin Rash  ? Codeine Rash  ? Lincomycin Rash  ? Morphine And Relate

## 2021-12-17 NOTE — Transfer of Care (Signed)
Immediate Anesthesia Transfer of Care Note ? ?Patient: Sandra Hendrix ? ?Procedure(s) Performed: COLONOSCOPY WITH PROPOFOL ? ?Patient Location: PACU ? ?Anesthesia Type:General ? ?Level of Consciousness: drowsy ? ?Airway & Oxygen Therapy: Patient Spontanous Breathing ? ?Post-op Assessment: Report given to RN and Post -op Vital signs reviewed and stable ? ?Post vital signs: Reviewed and stable ? ?Last Vitals:  ?Vitals Value Taken Time  ?BP    ?Temp    ?Pulse    ?Resp    ?SpO2    ? ? ?Last Pain:  ?Vitals:  ? 12/17/21 1003  ?TempSrc:   ?PainSc: 0-No pain  ?   ? ?  ? ?Complications: No notable events documented. ?

## 2021-12-20 ENCOUNTER — Encounter: Payer: Self-pay | Admitting: Gastroenterology

## 2021-12-20 DIAGNOSIS — K635 Polyp of colon: Secondary | ICD-10-CM

## 2021-12-20 LAB — SURGICAL PATHOLOGY

## 2021-12-20 LAB — HM COLONOSCOPY

## 2021-12-23 ENCOUNTER — Ambulatory Visit: Payer: Medicare PPO

## 2021-12-28 ENCOUNTER — Ambulatory Visit (INDEPENDENT_AMBULATORY_CARE_PROVIDER_SITE_OTHER): Payer: Medicare PPO

## 2021-12-28 VITALS — Ht 61.0 in | Wt 139.0 lb

## 2021-12-28 DIAGNOSIS — Z0000 Encounter for general adult medical examination without abnormal findings: Secondary | ICD-10-CM

## 2021-12-28 NOTE — Patient Instructions (Addendum)
?  Ms. Sandra Hendrix , ?Thank you for taking time to come for your Medicare Wellness Visit. I appreciate your ongoing commitment to your health goals. Please review the following plan we discussed and let me know if I can assist you in the future.  ? ?These are the goals we discussed: ? Goals   ? ?  Maintain healthy lifestyle   ?  Weight goal 145lb-150lb ?Stay active with balance exercise ?Increase water intake ? ?  ? ?  ?  ?This is a list of the screening recommended for you and due dates:  ?Health Maintenance  ?Topic Date Due  ? Zoster (Shingles) Vaccine (1 of 2) 03/29/2022*  ? Flu Shot  04/26/2022  ? Hemoglobin A1C  05/15/2022  ? Mammogram  08/24/2022  ? Eye exam for diabetics  08/26/2022  ? Complete foot exam   11/15/2022  ? Tetanus Vaccine  01/18/2024  ? Colon Cancer Screening  12/18/2026  ? Pneumonia Vaccine  Completed  ? DEXA scan (bone density measurement)  Completed  ? COVID-19 Vaccine  Completed  ? Hepatitis C Screening: USPSTF Recommendation to screen - Ages 46-79 yo.  Completed  ? HPV Vaccine  Aged Out  ?*Topic was postponed. The date shown is not the original due date.  ?  ?

## 2021-12-28 NOTE — Progress Notes (Signed)
Subjective:   Sandra Hendrix is a 76 y.o. female who presents for Medicare Annual (Subsequent) preventive examination.  Review of Systems    No ROS.  Medicare Wellness Virtual Visit.  Visual/audio telehealth visit, UTA vital signs.   See social history for additional risk factors.   Cardiac Risk Factors include: advanced age (>72men, >34 women)     Objective:    Today's Vitals   12/28/21 0849  Weight: 139 lb (63 kg)  Height: 5\' 1"  (1.549 m)   Body mass index is 26.26 kg/m.     12/17/2021    8:42 AM 09/29/2021    9:39 AM 12/18/2020   10:22 AM 12/08/2020    3:18 PM 11/23/2020   12:47 PM 09/16/2020   10:53 AM 01/28/2020    9:29 AM  Advanced Directives  Does Patient Have a Medical Advance Directive? Yes No Yes Yes Yes Yes Yes  Type of Estate agent of [place];Living will  Living will Healthcare Power of [place];Living will Healthcare Power of [place];Living will Healthcare Power of [place];Living will   Does patient want to make changes to medical advance directive?     No - Patient declined Yes (ED - Information included in AVS) No - Patient declined  Copy of Healthcare Power of Attorney in Chart? Yes - validated most recent copy scanned in chart (See row information)    No - copy requested    Would patient like information on creating a medical advance directive?  No - Patient declined         Current Medications (verified) Outpatient Encounter Medications as of 12/28/2021  Medication Sig   Nutritional Supplements (NUTRITIONAL SUPPLEMENT PO) Take 1 tablet by mouth. Taking "Useful Brain" supplement daily   acetaminophen (TYLENOL) 500 MG tablet Take 2 tablets (1,000 mg total) by mouth every 6 (six) hours. (Patient taking differently: Take 1,000 mg by mouth every 6 (six) hours as needed.)   albuterol (PROVENTIL HFA;VENTOLIN HFA) 108 (90 BASE) MCG/ACT inhaler Inhale 2 puffs into the lungs every 6 (six) hours as needed for wheezing or shortness of breath.     Ascorbic Acid 500 MG CHEW Chew by mouth.   atorvastatin (LIPITOR) 40 MG tablet TAKE 1/2 TABLET EVERY DAY AT 6 PM   azelastine (ASTELIN) 0.1 % nasal spray Place 1 spray into both nostrils daily.   benzonatate (TESSALON) 100 MG capsule Take 1 capsule (100 mg total) by mouth 3 (three) times daily as needed for cough.   Biotin 5000 MCG TABS Take by mouth 2 (two) times daily.    blood glucose meter kit and supplies Use to check blood sugar twice daily. ICD 10: E11.29   Cetirizine HCl 10 MG CAPS Take by mouth.   Cinnamon Bark POWD Take by mouth.   COLLAGEN PO Take by mouth 2 (two) times daily. For improved fingernail strength   dapagliflozin propanediol (FARXIGA) 10 MG TABS tablet Take 5 mg by mouth daily. Daily   docusate sodium (COLACE) 100 MG capsule Take 1 capsule (100 mg total) by mouth 2 (two) times daily.   fluticasone (FLONASE) 50 MCG/ACT nasal spray Place 1 spray into both nostrils daily.   fluticasone (FLOVENT DISKUS) 50 MCG/BLIST diskus inhaler Inhale 1 puff into the lungs 2 (two) times daily.   furosemide (LASIX) 20 MG tablet Take 1 tablet (20 mg total) by mouth 2 (two) times a week. As needed for fluid retention   glucose blood (ACCU-CHEK GUIDE) test strip USE 1 STRIP TO TEST BLOOD SUGARS  TWICE DAILY   insulin detemir (LEVEMIR) 100 UNIT/ML injection INJECT 15 UNITS SUBCUTANEOUSLY AT BEDTIME. DISCARD VIAL 42 DAYS AFTER OPENING   Lancet Devices (ACCU-CHEK SOFTCLIX) lancets Use to test blood sugars 1 -2 times daily   losartan-hydrochlorothiazide (HYZAAR) 50-12.5 MG tablet TAKE 1 TABLET BY MOUTH  DAILY   Magnesium 250 MG TABS Take 1 tablet by mouth daily.   meclizine (ANTIVERT) 25 MG tablet Take 1 tablet (25 mg total) by mouth 3 (three) times daily as needed for dizziness.   metFORMIN (GLUCOPHAGE XR) 500 MG 24 hr tablet Take 1 tablet (500 mg total) by mouth daily with breakfast.   Multiple Vitamin (MULTIVITAMIN) tablet Take 1 tablet by mouth daily.   oxybutynin (DITROPAN-XL) 10 MG 24 hr  tablet Take 1 tablet (10 mg total) by mouth daily.   pantoprazole (PROTONIX) 40 MG tablet Take 1 tablet by mouth twice daily   potassium chloride SA (KLOR-CON M) 20 MEQ tablet Take 1 tablet (20 mEq total) by mouth 2 (two) times a week.   Probiotic Product (PROBIOTIC-10) CAPS Take 1 capsule by mouth daily.    Semaglutide,0.25 or 0.5MG /DOS, (OZEMPIC, 0.25 OR 0.5 MG/DOSE,) 2 MG/1.5ML SOPN Inject 0.25 mg into the skin once a week.   sertraline (ZOLOFT) 50 MG tablet TAKE 1 TABLET EVERY DAY   tiZANidine (ZANAFLEX) 4 MG tablet tizanidine 4 mg tablet   verapamil (VERELAN PM) 120 MG 24 hr capsule Take 1 capsule (120 mg total) by mouth daily.   vitamin B-12 (CYANOCOBALAMIN) 1000 MCG tablet Take 1,000 mcg by mouth daily.   Zinc 50 MG TABS Take by mouth daily.   No facility-administered encounter medications on file as of 12/28/2021.    Allergies (verified) Clindamycin/lincomycin, Codeine, Lincomycin, Morphine and related, Talwin [pentazocine], Lisinopril, Naloxone, Oxycodone-acetaminophen, and Warfarin and related   History: Past Medical History:  Diagnosis Date   Allergy    Anxiety    Arthritis    Barrett's esophagus determined by endoscopy    Cancer (HCC)    Uterine Cancer   Chronic kidney disease    Chronic venous insufficiency    COVID-19 virus infection 07/21/2021   NOTIFIED ON OCT 26 . FEVERS BODY ACHES,  HEADACHES.  SENDING IN [facility]PIRAVIR   Depression    Diabetes mellitus without complication (HCC)    Difficult intubation    "not sure what happened,asleep was told had problem with intubation"   Diverticulosis    Fatty liver    GERD (gastroesophageal reflux disease)    History of bronchitis    History of hiatal hernia    Hyperlipidemia    Hypertension    Irritable bowel syndrome with constipation    Sleep apnea    Use C- PAP   Urinary incontinence    Varicose veins    Past Surgical History:  Procedure Laterality Date   CARDIAC CATHETERIZATION     CHOLECYSTECTOMY      COLONOSCOPY WITH PROPOFOL N/A 06/14/2016   Procedure: COLONOSCOPY WITH PROPOFOL;  Surgeon: Christena Deem, MD;  Location[facility] ENDOSCOPY;  Service: Endoscopy;  Laterality: N/A;   COLONOSCOPY WITH PROPOFOL N/A 12/18/2020   Procedure: COLONOSCOPY WITH PROPOFOL;  Surgeon: Regis Bill, MD;  Location: [facility] ENDOSCOPY;  Service: Endoscopy;  Laterality: N/A;   COLONOSCOPY WITH PROPOFOL N/A 12/17/2021   Procedure: COLONOSCOPY WITH PROPOFOL;  Surgeon: Jaynie Collins, DO;  Location: [facility] ENDOSCOPY;  Service: Gastroenterology;  Laterality: N/A;  DM   ESOPHAGOGASTRODUODENOSCOPY N/A 12/18/2020   Procedure: ESOPHAGOGASTRODUODENOSCOPY (EGD);  Surgeon: Regis Bill, MD;  Location: [facility] ENDOSCOPY;  Service: Endoscopy;  Laterality: N/A;   ESOPHAGOGASTRODUODENOSCOPY (EGD) WITH PROPOFOL N/A 06/14/2016   Procedure: ESOPHAGOGASTRODUODENOSCOPY (EGD) WITH PROPOFOL;  Surgeon: Christena Deem, MD;  Location: [facility] ENDOSCOPY;  Service: Endoscopy;  Laterality: N/A;   ESOPHAGOGASTRODUODENOSCOPY (EGD) WITH PROPOFOL N/A 06/12/2017   Procedure: ESOPHAGOGASTRODUODENOSCOPY (EGD) WITH PROPOFOL;  Surgeon: Christena Deem, MD;  Location: [facility] ENDOSCOPY;  Service: Endoscopy;  Laterality: N/A;   ESOPHAGOGASTRODUODENOSCOPY (EGD) WITH PROPOFOL N/A 08/25/2017   Procedure: ESOPHAGOGASTRODUODENOSCOPY (EGD) WITH PROPOFOL;  Surgeon: Christena Deem, MD;  Location: [facility] ENDOSCOPY;  Service: Endoscopy;  Laterality: N/A;   HYSTEROSCOPY WITH D & C N/A 01/02/2017   Procedure: DILATATION AND CURETTAGE /HYSTEROSCOPY;  Surgeon: Herold Harms, MD;  Location: [facility] ORS;  Service: Gynecology;  Laterality: N/A;   JOINT REPLACEMENT Right 2007   Total Knee Replacement   JOINT REPLACEMENT Left 2004   Total Knee Replacement   LAPAROSCOPIC HYSTERECTOMY Bilateral 03/01/2017   Procedure: HYSTERECTOMY TOTAL LAPAROSCOPIC BSO;  Surgeon: Leida Lauth, MD;  Location: [facility] ORS;  Service: Gynecology;  Laterality: Bilateral;   ROTATOR  CUFF REPAIR Right    SENTINEL NODE BIOPSY N/A 03/01/2017   Procedure: SENTINEL NODE INJECTION AND BIOPSY;  Surgeon: Leida Lauth, MD;  Location: [facility] ORS;  Service: Gynecology;  Laterality: N/A;   TONSILLECTOMY     as a child   TUBAL LIGATION     UPPER GI ENDOSCOPY     x2   Family History  Problem Relation Age of Onset   Stroke Mother    Hypertension Mother    Lupus Mother    Alcohol abuse Father    Diabetes Father    Cancer Father        gallbladder ca into liver    Arthritis Sister    Diabetes Sister    Lumbar disc disease Sister    Hyperlipidemia Daughter    Hypertension Daughter    Cancer Maternal Aunt        ovarian ca   Diabetes Paternal Aunt    Drug abuse Paternal Aunt    Cancer Maternal Grandfather        colon ca   Hypertension Brother    Alcohol abuse Brother    Stroke Brother    Social History   Socioeconomic History   Marital status: Married    Spouse name: Not on file   Number of children: 2   Years of education: 14   Highest education level: Some college, no degree  Occupational History   Not on file  Tobacco Use   Smoking status: Never   Smokeless tobacco: Never  Vaping Use   Vaping Use: Never used  Substance and Sexual Activity   Alcohol use: Not Currently    Comment: rare   Drug use: No   Sexual activity: Yes    Comment: not often  Other Topics Concern   Not on file  Social History Narrative   Not on file   Social Determinants of Health   Financial Resource Strain: Low Risk    Difficulty of Paying Living Expenses: Not hard at all  Food Insecurity: No Food Insecurity   Worried About Programme researcher, broadcasting/film/video in the Last Year: Never true   Ran Out of Food in the Last Year: Never true  Transportation Needs: No Transportation Needs   Lack of Transportation (Medical): No   Lack of Transportation (Non-Medical): No  Physical Activity: Sufficiently Active   Days of Exercise per Week: 3 days   Minutes  of Exercise per Session: 60 min  Stress:  No Stress Concern Present   Feeling of Stress : Not at all  Social Connections: Socially Integrated   Frequency of Communication with Friends and Family: More than three times a week   Frequency of Social Gatherings with Friends and Family: More than three times a week   Attends Religious Services: More than 4 times per year   Active Member of [employer] or Organizations: Yes   Attends Engineer, structural: More than 4 times per year   Marital Status: Married    Tobacco Counseling Counseling given: Not Answered   Clinical Intake:  Pre-visit preparation completed: Yes        Diabetes: No  How often do you need to have someone help you when you read instructions, pamphlets, or other written materials from your doctor or pharmacy?: 1 - Never    Interpreter Needed?: No      Activities of Daily Living    12/28/2021    9:02 AM  In your present state of health, do you have any difficulty performing the following activities:  Hearing? 1  Comment Hearing aids  Vision? 0  Difficulty concentrating or making decisions? 0  Walking or climbing stairs? 0  Dressing or bathing? 0  Doing errands, shopping? 0  Preparing Food and eating ? N  Using the Toilet? N  In the past six months, have you accidently leaked urine? N  Do you have problems with loss of bowel control? N  Managing your Medications? N  Managing your Finances? N  Housekeeping or managing your Housekeeping? N    Patient Care Team: Sherlene Shams, MD as PCP - General (Internal Medicine) Benita Gutter, RN as Registered Nurse  Indicate any recent Medical Services you may have received from other than [facility] providers in the past year (date may be approximate).     Assessment:   This is a routine wellness examination for Sandra Hendrix.  Virtual Visit via Telephone Note  I connected with  Sandra Hendrix on 12/28/21 at  8:45 AM EDT by telephone and verified that I am speaking with the correct person using two  identifiers.  Persons participating in the virtual visit: patient/Nurse Health Advisor   I discussed the limitations of performing an evaluation and management service by telehealth. The patient expressed understanding and agreed to proceed. We continued and completed visit with audio only.Some vital signs may be absent or patient reported.   Hearing/Vision screen Hearing Screening - Comments:: Hearing aids Vision Screening - Comments:: Followed by [facility]  Wears corrective lenses  They have regular follow up with the ophthalmologist  Dietary issues and exercise activities discussed: Current Exercise Habits: Home exercise routine, Time (Minutes): 60, Frequency (Times/Week): 3, Weekly Exercise (Minutes/Week): 180, Intensity: Mild Healthy diet Good water intake   Goals Addressed             This Visit's Progress    Maintain healthy lifestyle       Weight goal 145lb-150lb Stay active with balance exercise Increase water intake        Depression Screen    12/28/2021    8:59 AM 11/15/2021    9:09 AM 08/12/2021    1:17 PM 07/21/2021    2:10 PM 12/14/2020    2:43 PM 11/23/2020   12:43 PM 11/21/2019   12:37 PM  PHQ 2/9 Scores  PHQ - 2 Score 0 0 0 0 0 0 0  PHQ- 9 Score  0  0    Fall Risk    12/28/2021    9:01 AM 11/15/2021    9:09 AM 08/12/2021    1:17 PM 07/21/2021    2:09 PM 03/18/2021   10:06 AM  Fall Risk   Falls in the past year? 0 0 0 0 0  Risk for fall due to :  No Fall Risks No Fall Risks No Fall Risks   Follow up Falls evaluation completed Falls evaluation completed Falls evaluation completed Falls evaluation completed Falls evaluation completed    FALL RISK PREVENTION PERTAINING TO THE HOME:  Home free of loose throw rugs in walkways, pet beds, electrical cords, etc? Yes  Adequate lighting in your home to reduce risk of falls? Yes   ASSISTIVE DEVICES UTILIZED TO PREVENT FALLS: Life alert? No  Use of a cane, walker or w/c? No   TIMED UP AND  GO: Was the test performed? No .   Cognitive Function: Patient is alert and oriented x3.     12/12/2017   11:07 AM 11/28/2016   10:48 AM 10/26/2015    1:42 PM  MMSE - Mini Mental State Exam  Orientation to time 5 5 5   Orientation to Place 5 5 5   Registration 3 3 3   Attention/ Calculation 5 5 5   Recall 3 3 3   Language- name 2 objects 2 2 2   Language- repeat 1 1 1   Language- follow 3 step command 3 3 3   Language- read & follow direction 1 1 1   Write a sentence 1 1 1   Copy design 1 1 1   Total score 30 30 30         12/28/2021    9:03 AM 11/21/2019   12:55 PM  6CIT Screen  What Year? 0 points 0 points  What month? 0 points 0 points  What time? 0 points 0 points  Count back from 20 0 points 0 points  Months in reverse 0 points 0 points  Repeat phrase 0 points 0 points  Total Score 0 points 0 points    Immunizations Immunization History  Administered Date(s) Administered   Fluad Quad(high Dose 65+) 06/08/2019   Hep A / Hep B 08/20/2019, 08/20/2019, 09/17/2019   Influenza Split 05/21/2013, 06/06/2014, 05/28/2017   Influenza, High Dose Seasonal PF 05/29/2018   Influenza,inj,Quad PF,6+ Mos 06/05/2015, 06/09/2017   Influenza-Unspecified 06/19/2012, 06/14/2016, 04/26/2018, 07/10/2020, 07/13/2021   Moderna Covid-19 Vaccine Bivalent Booster 9yrs & up 07/13/2021   Moderna Sars-Covid-2 Vaccination 10/28/2019, 11/25/2019, 05/15/2020, 01/19/2021   PFIZER(Purple Top)SARS-COV-2 Vaccination 10/28/2019, 11/25/2019, 05/15/2020   Pneumococcal Conjugate-13 10/21/2013   Pneumococcal Polysaccharide-23 07/15/2004, 05/08/2010, 09/25/2015, 01/19/2021, 10/12/2021   Td 11/22/2003   Tdap 01/17/2014   Zoster, Live 01/17/2014   Shingrix Completed?: No.    Education has been provided regarding the importance of this vaccine. Patient has been advised to call insurance company to determine out of pocket expense if they have not yet received this vaccine. Advised may also receive vaccine at local  pharmacy or Health Dept. Verbalized acceptance and understanding.  Screening Tests Health Maintenance  Topic Date Due   Zoster Vaccines- Shingrix (1 of 2) 03/29/2022 (Originally 04/08/1965)   INFLUENZA VACCINE  04/26/2022   HEMOGLOBIN A1C  05/15/2022   MAMMOGRAM  08/24/2022   OPHTHALMOLOGY EXAM  08/26/2022   FOOT EXAM  11/15/2022   TETANUS/TDAP  01/18/2024   COLONOSCOPY (Pts 45-59yrs Insurance coverage will need to be confirmed)  12/18/2026   Pneumonia Vaccine 77+ Years old  Completed   DEXA  SCAN  Completed   COVID-19 Vaccine  Completed   Hepatitis C Screening  Completed   HPV VACCINES  Aged Out   Health Maintenance There are no preventive care reminders to display for this patient.  Lung Cancer Screening: (Low Dose CT Chest recommended if Age 75-80 years, 30 pack-year currently smoking OR have quit w/in 15years.) does not qualify.   Vision Screening: Recommended annual ophthalmology exams for early detection of glaucoma and other disorders of the eye.  Dental Screening: Recommended annual dental exams for proper oral hygiene  Community Resource Referral / Chronic Care Management: CRR required this visit?  No   CCM required this visit?  No      Plan:   Keep all routine maintenance appointments.   I have personally reviewed and noted the following in the patient's chart:   Medical and social history Use of alcohol, tobacco or illicit drugs  Current medications and supplements including opioid prescriptions.  Functional ability and status Nutritional status Physical activity Advanced directives List of other physicians Hospitalizations, surgeries, and ER visits in previous 12 months Vitals Screenings to include cognitive, depression, and falls Referrals and appointments  In addition, I have reviewed and discussed with patient certain preventive protocols, quality metrics, and best practice recommendations. A written personalized care plan for preventive services as  well as general preventive health recommendations were provided to patient.     Ashok Pall, LPN   10/01/1094

## 2022-01-04 DIAGNOSIS — N1831 Chronic kidney disease, stage 3a: Secondary | ICD-10-CM | POA: Diagnosis not present

## 2022-01-04 DIAGNOSIS — I10 Essential (primary) hypertension: Secondary | ICD-10-CM | POA: Diagnosis not present

## 2022-01-04 DIAGNOSIS — R809 Proteinuria, unspecified: Secondary | ICD-10-CM | POA: Diagnosis not present

## 2022-01-04 DIAGNOSIS — E1122 Type 2 diabetes mellitus with diabetic chronic kidney disease: Secondary | ICD-10-CM | POA: Diagnosis not present

## 2022-01-04 DIAGNOSIS — R600 Localized edema: Secondary | ICD-10-CM | POA: Diagnosis not present

## 2022-01-07 ENCOUNTER — Other Ambulatory Visit: Payer: Self-pay | Admitting: Internal Medicine

## 2022-01-07 ENCOUNTER — Telehealth: Payer: Self-pay | Admitting: Internal Medicine

## 2022-01-07 MED ORDER — INSULIN DETEMIR 100 UNIT/ML ~~LOC~~ SOLN: SUBCUTANEOUS | 1 refills | Status: DC

## 2022-01-07 NOTE — Telephone Encounter (Signed)
Medication has been refilled.

## 2022-01-07 NOTE — Telephone Encounter (Signed)
Patient is requesting a refill  on her insulin detemir (LEVEMIR) 100 UNIT/ML injection ?

## 2022-01-22 ENCOUNTER — Other Ambulatory Visit: Payer: Self-pay | Admitting: Internal Medicine

## 2022-02-06 NOTE — Progress Notes (Signed)
MRN : [id number]  Sandra Hendrix is a 76 y.o. (1946-02-15) female who presents with chief complaint of check circulation.  History of Present Illness:   The patient has a long history of hypertension.    The patient does have family history of hypertension.    There is no prior documented abdominal bruit. The patient occasionally has flushing symptoms but denies palpitations. No episodes of syncope.There is no history of headache. There is no history of flash pulmonary edema.   The patient denies a history of renal disease.   The patient denies amaurosis fugax or recent TIA symptoms. There are no recent neurological changes noted. The patient denies claudication symptoms or rest pain symptoms. The patient denies history of DVT, PE or superficial thrombophlebitis. The patient denies recent episodes of angina or shortness of breath.    Duplex ultrasound of the renal arteries shows 40-59% right renal artery stenosis and <40% left renal artery stenosis. No significant change compared to last study.   No outpatient medications have been marked as taking for the 02/07/22 encounter (Appointment) with Delana Meyer, Dolores Lory, MD.    Past Medical History:  Diagnosis Date   Allergy    Anxiety    Arthritis    Barrett's esophagus determined by endoscopy    Cancer [facility])    Uterine Cancer   Chronic kidney disease    Chronic venous insufficiency    COVID-19 virus infection 07/21/2021   NOTIFIED ON OCT 26 . FEVERS BODY ACHES,  HEADACHES.  SENDING IN [facility]   Depression    Diabetes mellitus without complication ([place])    Difficult intubation    "not sure what happened,asleep was told had problem with intubation"   Diverticulosis    Fatty liver    GERD (gastroesophageal reflux disease)    History of bronchitis    History of hiatal hernia    Hyperlipidemia    Hypertension    Irritable bowel syndrome with constipation    Sleep apnea    Use C- PAP   Urinary incontinence     Varicose veins     Past Surgical History:  Procedure Laterality Date   CARDIAC CATHETERIZATION     CHOLECYSTECTOMY     COLONOSCOPY WITH PROPOFOL N/A 06/14/2016   Procedure: COLONOSCOPY WITH PROPOFOL;  Surgeon: Lollie Sails, MD;  Location[facility] ENDOSCOPY;  Service: Endoscopy;  Laterality: N/A;   COLONOSCOPY WITH PROPOFOL N/A 12/18/2020   Procedure: COLONOSCOPY WITH PROPOFOL;  Surgeon: Lesly Rubenstein, MD;  Location: [facility] ENDOSCOPY;  Service: Endoscopy;  Laterality: N/A;   COLONOSCOPY WITH PROPOFOL N/A 12/17/2021   Procedure: COLONOSCOPY WITH PROPOFOL;  Surgeon: Annamaria Helling, DO;  Location: [facility] ENDOSCOPY;  Service: Gastroenterology;  Laterality: N/A;  DM   ESOPHAGOGASTRODUODENOSCOPY N/A 12/18/2020   Procedure: ESOPHAGOGASTRODUODENOSCOPY (EGD);  Surgeon: Lesly Rubenstein, MD;  Location: [facility] ENDOSCOPY;  Service: Endoscopy;  Laterality: N/A;   ESOPHAGOGASTRODUODENOSCOPY (EGD) WITH PROPOFOL N/A 06/14/2016   Procedure: ESOPHAGOGASTRODUODENOSCOPY (EGD) WITH PROPOFOL;  Surgeon: Lollie Sails, MD;  Location: [facility] ENDOSCOPY;  Service: Endoscopy;  Laterality: N/A;   ESOPHAGOGASTRODUODENOSCOPY (EGD) WITH PROPOFOL N/A 06/12/2017   Procedure: ESOPHAGOGASTRODUODENOSCOPY (EGD) WITH PROPOFOL;  Surgeon: Lollie Sails, MD;  Location: [facility] ENDOSCOPY;  Service: Endoscopy;  Laterality: N/A;   ESOPHAGOGASTRODUODENOSCOPY (EGD) WITH PROPOFOL N/A 08/25/2017   Procedure: ESOPHAGOGASTRODUODENOSCOPY (EGD) WITH PROPOFOL;  Surgeon: Lollie Sails, MD;  Location: [facility] ENDOSCOPY;  Service: Endoscopy;  Laterality: N/A;   HYSTEROSCOPY WITH D & C N/A 01/02/2017  Procedure: DILATATION AND CURETTAGE /HYSTEROSCOPY;  Surgeon: Brayton Mars, MD;  Location: [facility] ORS;  Service: Gynecology;  Laterality: N/A;   JOINT REPLACEMENT Right 2007   Total Knee Replacement   JOINT REPLACEMENT Left 2004   Total Knee Replacement   LAPAROSCOPIC HYSTERECTOMY Bilateral 03/01/2017   Procedure: HYSTERECTOMY TOTAL  LAPAROSCOPIC BSO;  Surgeon: Mellody Drown, MD;  Location: [facility] ORS;  Service: Gynecology;  Laterality: Bilateral;   ROTATOR CUFF REPAIR Right    SENTINEL NODE BIOPSY N/A 03/01/2017   Procedure: SENTINEL NODE INJECTION AND BIOPSY;  Surgeon: Mellody Drown, MD;  Location: [facility] ORS;  Service: Gynecology;  Laterality: N/A;   TONSILLECTOMY     as a child   TUBAL LIGATION     UPPER GI ENDOSCOPY     x2    Social History Social History   Tobacco Use   Smoking status: Never   Smokeless tobacco: Never  Vaping Use   Vaping Use: Never used  Substance Use Topics   Alcohol use: Not Currently    Comment: rare   Drug use: No    Family History Family History  Problem Relation Age of Onset   Stroke Mother    Hypertension Mother    Lupus Mother    Alcohol abuse Father    Diabetes Father    Cancer Father        gallbladder ca into liver    Arthritis Sister    Diabetes Sister    Lumbar disc disease Sister    Hyperlipidemia Daughter    Hypertension Daughter    Cancer Maternal Aunt        ovarian ca   Diabetes Paternal Aunt    Drug abuse Paternal Aunt    Cancer Maternal Grandfather        colon ca   Hypertension Brother    Alcohol abuse Brother    Stroke Brother     Allergies  Allergen Reactions   Clindamycin/Lincomycin Rash   Codeine Rash   Lincomycin Rash   Morphine And Related Nausea Only   Talwin [Pentazocine] Nausea And Vomiting   Lisinopril Cough    cough   Naloxone Other (See Comments)    Had issues with sedation and vomiting with EGD   Oxycodone-Acetaminophen Other (See Comments)    Pt states this medication makes her bowels stop moving   Warfarin And Related Nausea Only     REVIEW OF SYSTEMS (Negative unless checked)  Constitutional: '[]'$ Weight loss  '[]'$ Fever  '[]'$ Chills Cardiac: '[]'$ Chest pain   '[]'$ Chest pressure   '[]'$ Palpitations   '[]'$ Shortness of breath when laying flat   '[]'$ Shortness of breath with exertion. Vascular:  '[x]'$ Pain in legs with walking   '[]'$ Pain in  legs at rest  '[]'$ History of DVT   '[]'$ Phlebitis   '[]'$ Swelling in legs   '[]'$ Varicose veins   '[]'$ Non-healing ulcers Pulmonary:   '[]'$ Uses home oxygen   '[]'$ Productive cough   '[]'$ Hemoptysis   '[]'$ Wheeze  '[]'$ COPD   '[x]'$ Asthma Neurologic:  '[]'$ Dizziness   '[]'$ Seizures   '[]'$ History of stroke   '[]'$ History of TIA  '[]'$ Aphasia   '[]'$ Vissual changes   '[]'$ Weakness or numbness in arm   '[]'$ Weakness or numbness in leg Musculoskeletal:   '[]'$ Joint swelling   '[x]'$ Joint pain   '[]'$ Low back pain Hematologic:  '[]'$ Easy bruising  '[]'$ Easy bleeding   '[]'$ Hypercoagulable state   '[]'$ Anemic Gastrointestinal:  '[]'$ Diarrhea   '[]'$ Vomiting  '[x]'$ Gastroesophageal reflux/heartburn   '[]'$ Difficulty swallowing. Genitourinary:  '[]'$ Chronic kidney disease   '[]'$ Difficult urination  '[]'$ Frequent urination   '[]'$ Blood in urine  Skin:  '[]'$ Rashes   '[]'$ Ulcers  Psychological:  '[]'$ History of anxiety   '[]'$  History of major depression.  Physical Examination  There were no vitals filed for this visit. There is no height or weight on file to calculate BMI. Gen: WD/WN, NAD Head: [place]/AT, No temporalis wasting.  Ear/Nose/Throat: Hearing grossly intact, nares w/o erythema or drainage Eyes: PER, EOMI, sclera nonicteric.  Neck: Supple, no masses.  No bruit or JVD.  Pulmonary:  Good air movement, no audible wheezing, no use of accessory muscles.  Cardiac: RRR, normal S1, S2, no Murmurs. Vascular:  No abdominal bruits noted  mild venous stasis changes both ankles Vessel Right Left  Radial Palpable Palpable  Gastrointestinal: soft, non-distended. No guarding/no peritoneal signs.  Musculoskeletal: M/S 5/5 throughout.  No visible deformity.  Neurologic: CN 2-12 intact. Pain and light touch intact in extremities.  Symmetrical.  Speech is fluent. Motor exam as listed above. Psychiatric: Judgment intact, Mood & affect appropriate for pt's clinical situation. Dermatologic: No rashes or ulcers noted.  No changes consistent with cellulitis.   CBC Lab Results  Component Value Date   WBC 4.8 12/08/2020    HGB 12.0 12/08/2020   HCT 36.2 12/08/2020   MCV 89.4 12/08/2020   PLT 270 12/08/2020    BMET    Component Value Date/Time   NA 137 11/15/2021 0948   NA 142 11/18/2014 0000   K 3.3 (L) 11/15/2021 0948   CL 99 11/15/2021 0948   CO2 33 (H) 11/15/2021 0948   GLUCOSE 86 11/15/2021 0948   BUN 22 11/15/2021 0948   BUN 21 11/29/2016 0000   CREATININE 0.99 11/15/2021 0948   CALCIUM 9.4 11/15/2021 0948   GFRNONAA >60 12/08/2020 1525   GFRAA 60 (L) 02/27/2017 1117   CrCl cannot be calculated (Patient's most recent lab result is older than the maximum 21 days allowed.).  COAG Lab Results  Component Value Date   INR 1.1 01/23/2009    Radiology No results found.   Assessment/Plan 1. Renal artery stenosis (HCC) Given patient's arterial disease optimal control of the patient's hypertension is important. BP is acceptable today and at the present time she is on only 3 medications at fairly typical doses.   The patient's vital signs and noninvasive studies support the renal artery stenosis is not significantly.  Duplex ultrasound of the renal arteries demonstrates mild elevation in the velocities of the right renal artery. High resistance is noted in the cortex in the right kidney is 2 cm smaller than the left. These findings are more consistent with intrinsic renal disease rather than renal artery stenosis. Left kidney appears normal without evidence of hemodynamically significant stenosis.   No invasive studies or intervention is indicated at this time.   The patient will continue the current antihypertensive medications, no changes at this time.   The primary medical service will continue aggressive antihypertensive therapy as per the AHA guidelines  - VAS US RENAL ARTERY DUPLEX; Future  2. Chronic venous insufficiency No surgery or intervention at this point in time.     The patient is encouraged to continue wearing graduated compression stockings on a daily basis a  prescription was given. The patient will put the stockings on first thing in the morning and removing them in the evening. The patient is instructed specifically not to sleep in the stockings.     In addition, behavioral modification including several periods of elevation of the lower extremities during the day will be continued. I have demonstrated that proper  elevation is a position with the ankles at heart level.  3. Essential hypertension Continue antihypertensive medications as already ordered, these medications have been reviewed and there are no changes at this time.   4. Type 2 DM with CKD stage 3 and hypertension ([place]) Continue hypoglycemic medications as already ordered, these medications have been reviewed and there are no changes at this time.  Hgb A1C to be monitored as already arranged by primary service   5. Hyperlipidemia LDL goal <100 Continue statin as ordered and reviewed, no changes at this time     Hortencia Pilar, MD  02/06/2022 12:57 PM

## 2022-02-07 ENCOUNTER — Ambulatory Visit (INDEPENDENT_AMBULATORY_CARE_PROVIDER_SITE_OTHER): Payer: Medicare PPO

## 2022-02-07 ENCOUNTER — Other Ambulatory Visit: Payer: Self-pay | Admitting: Internal Medicine

## 2022-02-07 ENCOUNTER — Ambulatory Visit (INDEPENDENT_AMBULATORY_CARE_PROVIDER_SITE_OTHER): Payer: Medicare PPO | Admitting: Vascular Surgery

## 2022-02-07 ENCOUNTER — Encounter (INDEPENDENT_AMBULATORY_CARE_PROVIDER_SITE_OTHER): Payer: Self-pay | Admitting: Vascular Surgery

## 2022-02-07 VITALS — BP 136/66 | HR 67 | Resp 16 | Ht 61.0 in | Wt 141.0 lb

## 2022-02-07 DIAGNOSIS — N183 Chronic kidney disease, stage 3 unspecified: Secondary | ICD-10-CM

## 2022-02-07 DIAGNOSIS — J45991 Cough variant asthma: Secondary | ICD-10-CM

## 2022-02-07 DIAGNOSIS — I701 Atherosclerosis of renal artery: Secondary | ICD-10-CM

## 2022-02-07 DIAGNOSIS — E1122 Type 2 diabetes mellitus with diabetic chronic kidney disease: Secondary | ICD-10-CM

## 2022-02-07 DIAGNOSIS — J310 Chronic rhinitis: Secondary | ICD-10-CM | POA: Insufficient documentation

## 2022-02-07 DIAGNOSIS — I10 Essential (primary) hypertension: Secondary | ICD-10-CM

## 2022-02-07 DIAGNOSIS — I129 Hypertensive chronic kidney disease with stage 1 through stage 4 chronic kidney disease, or unspecified chronic kidney disease: Secondary | ICD-10-CM | POA: Diagnosis not present

## 2022-02-07 DIAGNOSIS — E785 Hyperlipidemia, unspecified: Secondary | ICD-10-CM

## 2022-02-07 DIAGNOSIS — J4520 Mild intermittent asthma, uncomplicated: Secondary | ICD-10-CM | POA: Insufficient documentation

## 2022-02-07 DIAGNOSIS — R053 Chronic cough: Secondary | ICD-10-CM | POA: Insufficient documentation

## 2022-02-07 DIAGNOSIS — I872 Venous insufficiency (chronic) (peripheral): Secondary | ICD-10-CM | POA: Diagnosis not present

## 2022-02-07 DIAGNOSIS — J300 Vasomotor rhinitis: Secondary | ICD-10-CM | POA: Insufficient documentation

## 2022-02-07 HISTORY — DX: Cough variant asthma: J45.991

## 2022-02-13 ENCOUNTER — Encounter (INDEPENDENT_AMBULATORY_CARE_PROVIDER_SITE_OTHER): Payer: Self-pay | Admitting: Vascular Surgery

## 2022-02-14 ENCOUNTER — Encounter: Payer: Self-pay | Admitting: Internal Medicine

## 2022-02-14 ENCOUNTER — Ambulatory Visit: Payer: Medicare PPO | Admitting: Internal Medicine

## 2022-02-14 VITALS — BP 128/70 | HR 68 | Temp 97.9°F | Ht 61.0 in | Wt 140.6 lb

## 2022-02-14 DIAGNOSIS — I10 Essential (primary) hypertension: Secondary | ICD-10-CM

## 2022-02-14 DIAGNOSIS — J411 Mucopurulent chronic bronchitis: Secondary | ICD-10-CM | POA: Diagnosis not present

## 2022-02-14 DIAGNOSIS — E663 Overweight: Secondary | ICD-10-CM | POA: Diagnosis not present

## 2022-02-14 DIAGNOSIS — N183 Chronic kidney disease, stage 3 unspecified: Secondary | ICD-10-CM | POA: Diagnosis not present

## 2022-02-14 DIAGNOSIS — I129 Hypertensive chronic kidney disease with stage 1 through stage 4 chronic kidney disease, or unspecified chronic kidney disease: Secondary | ICD-10-CM | POA: Diagnosis not present

## 2022-02-14 DIAGNOSIS — E1122 Type 2 diabetes mellitus with diabetic chronic kidney disease: Secondary | ICD-10-CM

## 2022-02-14 DIAGNOSIS — E785 Hyperlipidemia, unspecified: Secondary | ICD-10-CM

## 2022-02-14 LAB — LIPID PANEL
Cholesterol: 194 mg/dL (ref 0–200)
HDL: 96.8 mg/dL (ref >39.00)
LDL Cholesterol: 80 mg/dL (ref 0–99)
NonHDL: 96.83
Total CHOL/HDL Ratio: 2
Triglycerides: 86 mg/dL (ref 0.0–149.0)
VLDL: 17.2 mg/dL (ref 0.0–40.0)

## 2022-02-14 LAB — COMPREHENSIVE METABOLIC PANEL
ALT: 19 U/L (ref 0–35)
AST: 21 U/L (ref 0–37)
Albumin: 4.3 g/dL (ref 3.5–5.2)
Alkaline Phosphatase: 98 U/L (ref 39–117)
BUN: 21 mg/dL (ref 6–23)
CO2: 27 mEq/L (ref 19–32)
Calcium: 9.4 mg/dL (ref 8.4–10.5)
Chloride: 105 mEq/L (ref 96–112)
Creatinine, Ser: 0.93 mg/dL (ref 0.40–1.20)
GFR: 59.14 mL/min — ABNORMAL LOW (ref >60.00)
Glucose, Bld: 96 mg/dL (ref 70–99)
Potassium: 3.5 mEq/L (ref 3.5–5.1)
Sodium: 140 mEq/L (ref 135–145)
Total Bilirubin: 0.6 mg/dL (ref 0.2–1.2)
Total Protein: 6.7 g/dL (ref 6.0–8.3)

## 2022-02-14 LAB — HEMOGLOBIN A1C: Hgb A1c MFr Bld: 6.9 % — ABNORMAL HIGH (ref 4.6–6.5)

## 2022-02-14 LAB — LDL CHOLESTEROL, DIRECT: Direct LDL: 66 mg/dL

## 2022-02-14 NOTE — Progress Notes (Signed)
Subjective:  Patient ID: Lenna Sciara, female    DOB: 04/17/1946  Age: 76 y.o. MRN: [id number]  CC: The primary encounter diagnosis was Type 2 DM with CKD stage 3 and hypertension ([place] ). Diagnoses of Essential hypertension, Hyperlipidemia LDL goal <100, Mucopurulent chronic bronchitis ([place]), and Overweight (BMI 25.0-29.9) were also pertinent to this visit.   HPI Yuvonne P Ferencz presents for follow up on type 2 DM with obesity  Chief Complaint  Patient presents with   Follow-up    3 month follow up on Diabetes    1) T2DM:  She  feels generally well,  But is not  exercising regularly .  She has lost 40 lbs , attributable to taking 0.25 mg dose of  ozempic  since last May   Checking  blood sugars less than once daily at variable times, usually only if she feels she may be having a hypoglycemic event. .  BS have been under 130 fasting and < 150 post prandially.  Denies any recent hypoglyemic events.  Taking   medications as directed. Following a carbohydrate modified diet 6 days per week. Denies numbness, burning and tingling of extremities. Appetite is good.   Since starting FARXIGA from her nephrologist , she has seen an improvement in BS and had some lows so she has reduced dose to 5 mg and reduced ozempic from 0.5 to 0.25 mg   Outpatient Medications Prior to Visit  Medication Sig Dispense Refill   acetaminophen (TYLENOL) 500 MG tablet Take 2 tablets (1,000 mg total) by mouth every 6 (six) hours. (Patient taking differently: Take 1,000 mg by mouth every 6 (six) hours as needed.) 65 tablet 1   albuterol (PROVENTIL HFA;VENTOLIN HFA) 108 (90 BASE) MCG/ACT inhaler Inhale 2 puffs into the lungs every 6 (six) hours as needed for wheezing or shortness of breath.      Ascorbic Acid 500 MG CHEW Chew by mouth.     atorvastatin (LIPITOR) 40 MG tablet TAKE 1/2 TABLET EVERY DAY AT 6 PM 45 tablet 1   azelastine (ASTELIN) 0.1 % nasal spray Place 1 spray into both nostrils daily.     benzonatate  (TESSALON) 100 MG capsule Take 1 capsule (100 mg total) by mouth 3 (three) times daily as needed for cough. 21 capsule 0   Biotin 5000 MCG TABS Take by mouth 2 (two) times daily.      blood glucose meter kit and supplies Use to check blood sugar twice daily. ICD 10: E11.29 1 each 0   Cetirizine HCl 10 MG CAPS Take by mouth.     Cinnamon Bark POWD Take by mouth.     COLLAGEN PO Take by mouth 2 (two) times daily. For improved fingernail strength     dapagliflozin propanediol (FARXIGA) 10 MG TABS tablet Take 5 mg by mouth daily. Daily     docusate sodium (COLACE) 100 MG capsule Take 1 capsule (100 mg total) by mouth 2 (two) times daily. 20 capsule 0   fluticasone (FLONASE) 50 MCG/ACT nasal spray Place 1 spray into both nostrils daily. 48 g 1   fluticasone (FLOVENT DISKUS) 50 MCG/BLIST diskus inhaler Inhale 1 puff into the lungs 2 (two) times daily.     furosemide (LASIX) 20 MG tablet Take 1 tablet (20 mg total) by mouth 2 (two) times a week. As needed for fluid retention 45 tablet 3   glucose blood (ACCU-CHEK GUIDE) test strip USE 1 STRIP TO TEST BLOOD SUGARS  TWICE DAILY 50 each 0  insulin detemir (LEVEMIR) 100 UNIT/ML injection INJECT 18 UNITS SUBCUTANEOUSLY AT BEDTIME. DISCARD EACH VIAL 42 DAYS AFTER OPENING 30 mL 1   Lancet Devices (ACCU-CHEK SOFTCLIX) lancets Use to test blood sugars 1 -2 times daily 1 each 3   losartan-hydrochlorothiazide (HYZAAR) 50-12.5 MG tablet TAKE 1 TABLET EVERY DAY 90 tablet 3   Magnesium 250 MG TABS Take 1 tablet by mouth daily.     meclizine (ANTIVERT) 25 MG tablet Take 1 tablet (25 mg total) by mouth 3 (three) times daily as needed for dizziness. 30 tablet 3   metFORMIN (GLUCOPHAGE-XR) 500 MG 24 hr tablet TAKE 1 TABLET EVERY DAY WITH BREAKFAST 90 tablet 1   Multiple Vitamin (MULTIVITAMIN) tablet Take 1 tablet by mouth daily.     Nutritional Supplements (NUTRITIONAL SUPPLEMENT PO) Take 1 tablet by mouth. Taking "Useful Brain" supplement daily     oxybutynin  (DITROPAN-XL) 10 MG 24 hr tablet Take 1 tablet (10 mg total) by mouth daily. 90 tablet 3   pantoprazole (PROTONIX) 40 MG tablet Take 1 tablet by mouth twice daily 180 tablet 0   potassium chloride SA (KLOR-CON M) 20 MEQ tablet Take 1 tablet (20 mEq total) by mouth 2 (two) times a week. 45 tablet 1   Probiotic Product (PROBIOTIC-10) CAPS Take 1 capsule by mouth daily.      Semaglutide,0.25 or 0.5MG/DOS, (OZEMPIC, 0.25 OR 0.5 MG/DOSE,) 2 MG/1.5ML SOPN Inject 0.25 mg into the skin once a week. 3 mL 3   sertraline (ZOLOFT) 50 MG tablet TAKE 1 TABLET EVERY DAY 90 tablet 3   tiZANidine (ZANAFLEX) 4 MG tablet tizanidine 4 mg tablet     verapamil (VERELAN PM) 120 MG 24 hr capsule Take 1 capsule (120 mg total) by mouth daily. 90 capsule 1   vitamin B-12 (CYANOCOBALAMIN) 1000 MCG tablet Take 1,000 mcg by mouth daily.     Zinc 50 MG TABS Take by mouth daily.     No facility-administered medications prior to visit.    Review of Systems;  Patient denies headache, fevers, malaise, unintentional weight loss, skin rash, eye pain, sinus congestion and sinus pain, sore throat, dysphagia,  hemoptysis , cough, dyspnea, wheezing, chest pain, palpitations, orthopnea, edema, abdominal pain, nausea, melena, diarrhea, constipation, flank pain, dysuria, hematuria, urinary  Frequency, nocturia, numbness, tingling, seizures,  Focal weakness, Loss of consciousness,  Tremor, insomnia, depression, anxiety, and suicidal ideation.      Objective:  BP 128/70 (BP Location: Left Arm, Patient Position: Sitting, Cuff Size: Normal)   Pulse 68   Temp 97.9 F (36.6 C) (Oral)   Ht '5\' 1"'  (1.549 m)   Wt 140 lb 9.6 oz (63.8 kg)   SpO2 94%   BMI 26.57 kg/m   BP Readings from Last 3 Encounters:  02/14/22 128/70  02/07/22 136/66  12/17/21 118/81    Wt Readings from Last 3 Encounters:  02/14/22 140 lb 9.6 oz (63.8 kg)  02/07/22 141 lb (64 kg)  12/28/21 139 lb (63 kg)    General appearance: alert, cooperative and appears  stated age Ears: normal TM's and external ear canals both ears Throat: lips, mucosa, and tongue normal; teeth and gums normal Neck: no adenopathy, no carotid bruit, supple, symmetrical, trachea midline and thyroid not enlarged, symmetric, no tenderness/mass/nodules Back: symmetric, no curvature. ROM normal. No CVA tenderness. Lungs: clear to auscultation bilaterally Heart: regular rate and rhythm, S1, S2 normal, no murmur, click, rub or gallop Abdomen: soft, non-tender; bowel sounds normal; no masses,  no organomegaly Pulses: 2+ and  symmetric Skin: Skin color, texture, turgor normal. No rashes or lesions Lymph nodes: Cervical, supraclavicular, and axillary nodes normal.  Lab Results  Component Value Date   HGBA1C 6.9 (H) 02/14/2022   HGBA1C 6.4 11/15/2021   HGBA1C 6.7 (H) 08/12/2021    Lab Results  Component Value Date   CREATININE 0.93 02/14/2022   CREATININE 0.99 11/15/2021   CREATININE 0.89 08/12/2021    Lab Results  Component Value Date   WBC 4.8 12/08/2020   HGB 12.0 12/08/2020   HCT 36.2 12/08/2020   PLT 270 12/08/2020   GLUCOSE 96 02/14/2022   CHOL 194 02/14/2022   TRIG 86.0 02/14/2022   HDL 96.80 02/14/2022   LDLDIRECT 66.0 02/14/2022   LDLCALC 80 02/14/2022   ALT 19 02/14/2022   AST 21 02/14/2022   NA 140 02/14/2022   K 3.5 02/14/2022   CL 105 02/14/2022   CREATININE 0.93 02/14/2022   BUN 21 02/14/2022   CO2 27 02/14/2022   TSH 2.95 12/10/2020   INR 1.1 01/23/2009   HGBA1C 6.9 (H) 02/14/2022   MICROALBUR 1.0 11/15/2021    No results found.  Assessment & Plan:   Problem List Items Addressed This Visit     Hyperlipidemia LDL goal <100   Relevant Orders   Lipid Profile (Completed)   Direct LDL (Completed)   Essential hypertension   Type 2 DM with CKD stage 3 and hypertension ([place]) - Primary    Currently well-controlled on current medications .  hemoglobin A1c is at goal of less than 7.0 . She is takig the mnimal dose of ozempic and 5 mg Farxiga  along wit h10 units Levem.r.  Will dc leveir  Increase farxica to 10 mg  dail y Patient is reminded to schedule an annual eye exam and foot exam is normal today. Patient has no microalbuminuria. Patient is tolerating statin therapy for CAD risk reduction and on ACE/ARB for renal protection and hypertension   Lab Results  Component Value Date   HGBA1C 6.9 (H) 02/14/2022  \ Lab Results  Component Value Date   LABMICR See below: 09/30/2019   MICROALBUR 1.0 11/15/2021   MICROALBUR 0.7 03/18/2021           Relevant Orders   Comp Met (CMET) (Completed)   Hemoglobin A1c (Completed)   Overweight (BMI 25.0-29.9)    She has lost 40 lbs o minimal dose of Ozempic and declines dose adjustment        Mucopurulent chronic bronchitis ([place])    Present for over one year,  With recent chest film noting moderate HH and LLL atx  . Now managed by pulmonology         I spent a total of  30  minutes with this patient in a face to face visit on the date of this encounter reviewing the last office visit with me ,  weights over the past 12 months,          ,  most recent with patient's nephrologist in April , patient's diet and eating habits, home blood pressure readings ,  most recent imaging study ,   and post visit ordering of testing and therapeutics.    Follow-up: Return in about 3 months (around 05/17/2022).   Crecencio Mc, MD

## 2022-02-14 NOTE — Assessment & Plan Note (Signed)
She has lost 40 lbs o minimal dose of Ozempic and declines dose adjustment

## 2022-02-14 NOTE — Assessment & Plan Note (Signed)
Present for over one year,  With recent chest film noting moderate HH and LLL atx  . Now managed by pulmonology

## 2022-02-14 NOTE — Assessment & Plan Note (Signed)
Currently well-controlled on current medications .  hemoglobin A1c is at goal of less than 7.0 . She is takig the mnimal dose of ozempic and 5 mg Farxiga along wit h10 units Levem.r.  Will dc leveir  Increase farxica to 10 mg  dail y Patient is reminded to schedule an annual eye exam and foot exam is normal today. Patient has no microalbuminuria. Patient is tolerating statin therapy for CAD risk reduction and on ACE/ARB for renal protection and hypertension   Lab Results  Component Value Date   HGBA1C 6.9 (H) 02/14/2022  \ Lab Results  Component Value Date   LABMICR See below: 09/30/2019   MICROALBUR 1.0 11/15/2021   MICROALBUR 0.7 03/18/2021

## 2022-02-14 NOTE — Patient Instructions (Signed)
YOU ARE DOING TREMENDOUSLY WELL!  If Your a1c is 6.5 or below today,  we will STOP THE LEVEMIR  and increase the Farxiga to 10 mg daily

## 2022-03-08 DIAGNOSIS — E1169 Type 2 diabetes mellitus with other specified complication: Secondary | ICD-10-CM | POA: Diagnosis not present

## 2022-03-08 DIAGNOSIS — Z9989 Dependence on other enabling machines and devices: Secondary | ICD-10-CM | POA: Diagnosis not present

## 2022-03-08 DIAGNOSIS — E785 Hyperlipidemia, unspecified: Secondary | ICD-10-CM | POA: Diagnosis not present

## 2022-03-08 DIAGNOSIS — I872 Venous insufficiency (chronic) (peripheral): Secondary | ICD-10-CM | POA: Diagnosis not present

## 2022-03-08 DIAGNOSIS — G4733 Obstructive sleep apnea (adult) (pediatric): Secondary | ICD-10-CM | POA: Diagnosis not present

## 2022-03-08 DIAGNOSIS — I10 Essential (primary) hypertension: Secondary | ICD-10-CM | POA: Diagnosis not present

## 2022-03-09 ENCOUNTER — Telehealth (INDEPENDENT_AMBULATORY_CARE_PROVIDER_SITE_OTHER): Payer: Medicare PPO | Admitting: Internal Medicine

## 2022-03-09 ENCOUNTER — Ambulatory Visit
Admission: RE | Admit: 2022-03-09 | Discharge: 2022-03-09 | Disposition: A | Discharge status: Home / Self Care | Payer: Medicare PPO | Attending: Internal Medicine | Admitting: Internal Medicine

## 2022-03-09 ENCOUNTER — Encounter: Payer: Self-pay | Admitting: Internal Medicine

## 2022-03-09 ENCOUNTER — Ambulatory Visit
Admission: RE | Admit: 2022-03-09 | Discharge: 2022-03-09 | Disposition: A | Discharge status: Home / Self Care | Payer: Medicare PPO | Source: Ambulatory Visit | Attending: Internal Medicine | Admitting: Internal Medicine

## 2022-03-09 VITALS — Temp 99.7°F

## 2022-03-09 DIAGNOSIS — R630 Anorexia: Secondary | ICD-10-CM

## 2022-03-09 DIAGNOSIS — K449 Diaphragmatic hernia without obstruction or gangrene: Secondary | ICD-10-CM | POA: Diagnosis not present

## 2022-03-09 DIAGNOSIS — I700 Atherosclerosis of aorta: Secondary | ICD-10-CM | POA: Diagnosis not present

## 2022-03-09 DIAGNOSIS — R509 Fever, unspecified: Secondary | ICD-10-CM

## 2022-03-09 DIAGNOSIS — R5383 Other fatigue: Secondary | ICD-10-CM

## 2022-03-09 DIAGNOSIS — M47814 Spondylosis without myelopathy or radiculopathy, thoracic region: Secondary | ICD-10-CM | POA: Insufficient documentation

## 2022-03-09 DIAGNOSIS — R058 Other specified cough: Secondary | ICD-10-CM | POA: Diagnosis not present

## 2022-03-09 LAB — POCT INFLUENZA A/B
Influenza A, POC: NEGATIVE
Influenza B, POC: NEGATIVE

## 2022-03-09 MED ORDER — AZITHROMYCIN 250 MG PO TABS: ORAL_TABLET | ORAL | 0 refills | Status: AC

## 2022-03-09 NOTE — Progress Notes (Signed)
Telephone Note  I connected with Sandra Hendrix   on 03/09/22 at  9:20 AM EDT by a telephone and verified that I am speaking with the correct person using two identifiers.  Location patient: Sandra Hendrix Location provider:work or home office Persons participating in the virtual visit: patient, provider  I discussed the limitations and requested verbal permission for telemedicine visit. The patient expressed understanding and agreed to proceed.   HPI:  Acute telemedicine visit for : Yesterday stated sxs chills, h/a, loss of appetite, temp 101 F taking Tylenol, dry cough  Tested covid yesterday negative   -Pertinent past medical history: see below -Pertinent medication allergies: Allergies  Allergen Reactions   Clindamycin/Lincomycin Rash   Codeine Rash   Lincomycin Rash   Morphine And Related Nausea Only   Talwin [Pentazocine] Nausea And Vomiting   Lisinopril Cough    cough   Naloxone Other (See Comments)    Had issues with sedation and vomiting with EGD   Oxycodone-Acetaminophen Other (See Comments)    Pt states this medication makes her bowels stop moving   Warfarin And Related Nausea Only   -COVID-19 vaccine status:  Immunization History  Administered Date(s) Administered   Fluad Quad(high Dose 65+) 06/08/2019   Hep A / Hep B 08/20/2019, 08/20/2019, 09/17/2019   Influenza Split 05/21/2013, 06/06/2014, 05/28/2017   Influenza, High Dose Seasonal PF 05/29/2018   Influenza,inj,Quad PF,6+ Mos 06/05/2015, 06/09/2017   Influenza-Unspecified 06/19/2012, 06/14/2016, 04/26/2018, 07/10/2020, 07/13/2021   Moderna Covid-19 Vaccine Bivalent Booster 41yr & up 07/13/2021   Moderna Sars-Covid-2 Vaccination 10/28/2019, 11/25/2019, 05/15/2020, 01/19/2021   PFIZER(Purple Top)SARS-COV-2 Vaccination 10/28/2019, 11/25/2019, 05/15/2020   Pneumococcal Conjugate-13 10/21/2013   Pneumococcal Polysaccharide-23 07/15/2004, 05/08/2010, 09/25/2015, 01/19/2021, 10/12/2021   Td 11/22/2003   Tdap 01/17/2014    Zoster, Live 01/17/2014     ROS: See pertinent positives and negatives per HPI.  Past Medical History:  Diagnosis Date   Allergy    Anxiety    Arthritis    Barrett's esophagus determined by endoscopy    Cancer ([place]    Uterine Cancer   Chronic kidney disease    Chronic venous insufficiency    Cough variant asthma 02/07/2022   COVID-19 virus infection 07/21/2021   NOTIFIED ON OCT 26 . FEVERS BODY ACHES,  HEADACHES.  SENDING IN [facility]   Depression    Diabetes mellitus without complication ([place]    Difficult intubation    "not sure what happened,asleep was told had problem with intubation"   Diverticulosis    Fatty liver    GERD (gastroesophageal reflux disease)    History of bronchitis    History of hiatal hernia    Hyperlipidemia    Hypertension    Irritable bowel syndrome with constipation    Sleep apnea    Use C- PAP   Urinary incontinence    Varicose veins     Past Surgical History:  Procedure Laterality Date   CARDIAC CATHETERIZATION     CHOLECYSTECTOMY     COLONOSCOPY WITH PROPOFOL N/A 06/14/2016   Procedure: COLONOSCOPY WITH PROPOFOL;  Surgeon: MLollie Sails MD;  [facility];  Service: Endoscopy;  Laterality: N/A;   COLONOSCOPY WITH PROPOFOL N/A 12/18/2020   Procedure: COLONOSCOPY WITH PROPOFOL;  Surgeon: LLesly Rubenstein MD;  Location: [facility] ENDOSCOPY;  Service: Endoscopy;  Laterality: N/A;   COLONOSCOPY WITH PROPOFOL N/A 12/17/2021   Procedure: COLONOSCOPY WITH PROPOFOL;  Surgeon: RAnnamaria Helling DO;  [facility];  Service: Gastroenterology;  Laterality: N/A;  DM   ESOPHAGOGASTRODUODENOSCOPY  N/A 12/18/2020   Procedure: ESOPHAGOGASTRODUODENOSCOPY (EGD);  Surgeon: Lesly Rubenstein, MD;  Location[facility] END[facility]COPY;  Service: Endoscopy;  Laterality: N/A;   ESOPHAGOGASTRODUODENOSCOPY (EGD) WITH PROPOFOL N/A 06/14/2016   Procedure: ESOPHAGOGASTRODUODENOSCOPY (EGD) WITH PROPOFOL;  Surgeon: Lollie Sails, MD;  Location:  [facility] ENDOSCOPY;  Service: Endoscopy;  Laterality: N/A;   ESOPHAGOGASTRODUODENOSCOPY (EGD) WITH PROPOFOL N/A 06/12/2017   Procedure: ESOPHAGOGASTRODUODENOSCOPY (EGD) WITH PROPOFOL;  Surgeon: Lollie Sails, MD;  Location: [facility] ENDOSCOPY;  Service: Endoscopy;  Laterality: N/A;   ESOPHAGOGASTRODUODENOSCOPY (EGD) WITH PROPOFOL N/A 08/25/2017   Procedure: ESOPHAGOGASTRODUODENOSCOPY (EGD) WITH PROPOFOL;  Surgeon: Lollie Sails, MD;  Location: [facility] ENDOSCOPY;  Service: Endoscopy;  Laterality: N/A;   HYSTEROSCOPY WITH D & C N/A 01/02/2017   Procedure: DILATATION AND CURETTAGE /HYSTEROSCOPY;  Surgeon: Brayton Mars, MD;  Location: [facility] ORS;  Service: Gynecology;  Laterality: N/A;   JOINT REPLACEMENT Right 2007   Total Knee Replacement   JOINT REPLACEMENT Left 2004   Total Knee Replacement   LAPAROSCOPIC HYSTERECTOMY Bilateral 03/01/2017   Procedure: HYSTERECTOMY TOTAL LAPAROSCOPIC BSO;  Surgeon: Mellody Drown, MD;  Location: [facility] ORS;  Service: Gynecology;  Laterality: Bilateral;   ROTATOR CUFF REPAIR Right    SENTINEL NODE BIOPSY N/A 03/01/2017   Procedure: SENTINEL NODE INJECTION AND BIOPSY;  Surgeon: Mellody Drown, MD;  Location: [facility] ORS;  Service: Gynecology;  Laterality: N/A;   TONSILLECTOMY     as a child   TUBAL LIGATION     UPPER GI ENDOSCOPY     x2     Current Outpatient Medications:    acetaminophen (TYLENOL) 500 MG tablet, Take 2 tablets (1,000 mg total) by mouth every 6 (six) hours. (Patient taking differently: Take 1,000 mg by mouth every 6 (six) hours as needed.), Disp: 65 tablet, Rfl: 1   albuterol (PROVENTIL HFA;VENTOLIN HFA) 108 (90 BASE) MCG/ACT inhaler, Inhale 2 puffs into the lungs every 6 (six) hours as needed for wheezing or shortness of breath. , Disp: , Rfl:    Ascorbic Acid 500 MG CHEW, Chew by mouth., Disp: , Rfl:    atorvastatin (LIPITOR) 40 MG tablet, TAKE 1/2 TABLET EVERY DAY AT 6 PM, Disp: 45 tablet, Rfl: 1   azelastine (ASTELIN) 0.1 % nasal spray,  Place 1 spray into both nostrils daily., Disp: , Rfl:    benzonatate (TESSALON) 100 MG capsule, Take 1 capsule (100 mg total) by mouth 3 (three) times daily as needed for cough., Disp: 21 capsule, Rfl: 0   Biotin 5000 MCG TABS, Take by mouth 2 (two) times daily. , Disp: , Rfl:    blood glucose meter kit and supplies, Use to check blood sugar twice daily. ICD 10: E11.29, Disp: 1 each, Rfl: 0   Cetirizine HCl 10 MG CAPS, Take by mouth., Disp: , Rfl:    Cinnamon Bark POWD, Take by mouth., Disp: , Rfl:    COLLAGEN PO, Take by mouth 2 (two) times daily. For improved fingernail strength, Disp: , Rfl:    dapagliflozin propanediol (FARXIGA) 10 MG TABS tablet, Take 5 mg by mouth daily. Daily, Disp: , Rfl:    docusate sodium (COLACE) 100 MG capsule, Take 1 capsule (100 mg total) by mouth 2 (two) times daily., Disp: 20 capsule, Rfl: 0   fluticasone (FLONASE) 50 MCG/ACT nasal spray, Place 1 spray into both nostrils daily., Disp: 48 g, Rfl: 1   fluticasone (FLOVENT DISKUS) 50 MCG/BLIST diskus inhaler, Inhale 1 puff into the lungs 2 (two) times daily., Disp: , Rfl:  furosemide (LASIX) 20 MG tablet, Take 1 tablet (20 mg total) by mouth 2 (two) times a week. As needed for fluid retention, Disp: 45 tablet, Rfl: 3   glucose blood (ACCU-CHEK GUIDE) test strip, USE 1 STRIP TO TEST BLOOD SUGARS  TWICE DAILY, Disp: 50 each, Rfl: 0   insulin detemir (LEVEMIR) 100 UNIT/ML injection, INJECT 18 UNITS SUBCUTANEOUSLY AT BEDTIME. DISCARD EACH VIAL 42 DAYS AFTER OPENING, Disp: 30 mL, Rfl: 1   Lancet Devices (ACCU-CHEK SOFTCLIX) lancets, Use to test blood sugars 1 -2 times daily, Disp: 1 each, Rfl: 3   losartan-hydrochlorothiazide (HYZAAR) 50-12.5 MG tablet, TAKE 1 TABLET EVERY DAY, Disp: 90 tablet, Rfl: 3   Magnesium 250 MG TABS, Take 1 tablet by mouth daily., Disp: , Rfl:    meclizine (ANTIVERT) 25 MG tablet, Take 1 tablet (25 mg total) by mouth 3 (three) times daily as needed for dizziness., Disp: 30 tablet, Rfl: 3    metFORMIN (GLUCOPHAGE-XR) 500 MG 24 hr tablet, TAKE 1 TABLET EVERY DAY WITH BREAKFAST, Disp: 90 tablet, Rfl: 1   Multiple Vitamin (MULTIVITAMIN) tablet, Take 1 tablet by mouth daily., Disp: , Rfl:    Nutritional Supplements (NUTRITIONAL SUPPLEMENT PO), Take 1 tablet by mouth. Taking "Useful Brain" supplement daily, Disp: , Rfl:    oxybutynin (DITROPAN-XL) 10 MG 24 hr tablet, Take 1 tablet (10 mg total) by mouth daily., Disp: 90 tablet, Rfl: 3   pantoprazole (PROTONIX) 40 MG tablet, Take 1 tablet by mouth twice daily, Disp: 180 tablet, Rfl: 0   potassium chloride SA (KLOR-CON M) 20 MEQ tablet, Take 1 tablet (20 mEq total) by mouth 2 (two) times a week., Disp: 45 tablet, Rfl: 1   Probiotic Product (PROBIOTIC-10) CAPS, Take 1 capsule by mouth daily. , Disp: , Rfl:    Semaglutide,0.25 or 0.5MG/DOS, (OZEMPIC, 0.25 OR 0.5 MG/DOSE,) 2 MG/1.5ML SOPN, Inject 0.25 mg into the skin once a week., Disp: 3 mL, Rfl: 3   sertraline (ZOLOFT) 50 MG tablet, TAKE 1 TABLET EVERY DAY, Disp: 90 tablet, Rfl: 3   tiZANidine (ZANAFLEX) 4 MG tablet, tizanidine 4 mg tablet, Disp: , Rfl:    verapamil (VERELAN PM) 120 MG 24 hr capsule, Take 1 capsule (120 mg total) by mouth daily., Disp: 90 capsule, Rfl: 1   vitamin B-12 (CYANOCOBALAMIN) 1000 MCG tablet, Take 1,000 mcg by mouth daily., Disp: , Rfl:    Zinc 50 MG TABS, Take by mouth daily., Disp: , Rfl:   EXAM:  VITALS per patient if applicable:  GENERAL: alert, oriented, appears well and in no acute distress  PSYCH/NEURO: pleasant and cooperative, no obvious depression or anxiety, speech and thought processing grossly intact  ASSESSMENT AND PLAN:  Discussed the following assessment and plan:  Fatigue, fever, dry cough, appetite loss - Plan: DG Chest 2 View,   Fever, unspecified fever cause - Plan: Novel Coronavirus, NAA ([facility]), POCT Influenza A/B, DG Chest 2 View,   Dry cough  Supportive care    -we discussed possible serious and likely etiologies, options  for evaluation and workup, limitations of telemedicine visit vs in person visit, treatment, treatment risks and precautions. Pt is agreeable to treatment via telemedicine at this moment.   I discussed the assessment and treatment plan with the patient. The patient was provided an opportunity to ask questions and all were answered. The patient agreed with the plan and demonstrated an understanding of the instructions.    Time 20 minutes Delorise Jackson, MD

## 2022-03-09 NOTE — Patient Instructions (Signed)
If needing prescription strength medication we will need to make an appointment with a provider.  These are over the counter medication options:  Mucinex dm green label for cough or robitussin DM  Multivitamin or below vitamins  Vitamin C 1000 mg daily.  Vitamin D3 4000 Iu (units) daily.  Zinc 100 mg daily.  Quercetin 250-500 mg 2 times per day   Elderberry  Oil of oregano  cepacol or chloroseptic spray Warm salt water gargles +hydrogen peroxide Sugar free cough drops  Warm tea with honey and lemon  Hydration  Try to eat though you dont feel like it   Tylenol or Advil  Nasal saline and Flonase 2 sprays nasal congestion  If sneezing/runny nose over the counter allergy pill claritin,allegra, zyrtec, xyzal Quarantine x 10-14 days 14 days preferred   Monitor pulse oximeter, buy from [employer] if oxygen is less than 90 please go to the hospital.        Are you feeling really sick? Shortness of breath, cough, chest pain?, dizziness? Confusion   If so let me know  If worsening, go to hospital or [facility] Urgent care for further treatment.    

## 2022-03-11 ENCOUNTER — Other Ambulatory Visit: Payer: Self-pay | Admitting: Internal Medicine

## 2022-03-12 LAB — NOVEL CORONAVIRUS, NAA: SARS-CoV-2, NAA: NOT DETECTED

## 2022-03-14 ENCOUNTER — Encounter: Payer: Self-pay | Admitting: Internal Medicine

## 2022-03-15 NOTE — Telephone Encounter (Signed)
Pt returning call

## 2022-03-15 NOTE — Telephone Encounter (Signed)
LMTCB

## 2022-03-17 ENCOUNTER — Telehealth: Payer: Self-pay | Admitting: *Deleted

## 2022-03-17 NOTE — Telephone Encounter (Signed)
Pt called back and stated she has stopped the atorvastatin medication per provider because she wanted see how she was without taking it. Pt stated she went back to increasing the kidney medicine

## 2022-03-17 NOTE — Telephone Encounter (Signed)
LMTCB

## 2022-03-23 ENCOUNTER — Encounter: Payer: Self-pay | Admitting: Internal Medicine

## 2022-03-23 ENCOUNTER — Ambulatory Visit: Payer: Medicare PPO | Admitting: Internal Medicine

## 2022-03-23 VITALS — BP 116/62 | HR 65 | Temp 97.8°F | Ht 61.0 in | Wt 134.0 lb

## 2022-03-23 DIAGNOSIS — E1169 Type 2 diabetes mellitus with other specified complication: Secondary | ICD-10-CM

## 2022-03-23 DIAGNOSIS — E663 Overweight: Secondary | ICD-10-CM | POA: Diagnosis not present

## 2022-03-23 DIAGNOSIS — N183 Chronic kidney disease, stage 3 unspecified: Secondary | ICD-10-CM | POA: Diagnosis not present

## 2022-03-23 DIAGNOSIS — E785 Hyperlipidemia, unspecified: Secondary | ICD-10-CM | POA: Diagnosis not present

## 2022-03-23 DIAGNOSIS — I129 Hypertensive chronic kidney disease with stage 1 through stage 4 chronic kidney disease, or unspecified chronic kidney disease: Secondary | ICD-10-CM | POA: Diagnosis not present

## 2022-03-23 DIAGNOSIS — E1122 Type 2 diabetes mellitus with diabetic chronic kidney disease: Secondary | ICD-10-CM | POA: Diagnosis not present

## 2022-03-23 NOTE — Patient Instructions (Addendum)
I agree you do not need to lose more weight ahd you need to stop eating the cakes!  I recommend that you:  Take a break from ozempic for two weeks.  If your appetite increases too much  you can  resume the ozempic at every other week dosing  . If you are still losing weight,  stop the medication completely and send me a message   We should recheck you A1c in late August (after aUG 23)

## 2022-03-23 NOTE — Progress Notes (Unsigned)
Subjective:  Patient ID: Lenna Sciara, female    DOB: Jan 31, 1946  Age: 76 y.o. MRN: [id number]  CC: The primary encounter diagnosis was Type 2 DM with CKD stage 3 and hypertension ([place]). Diagnoses of Hyperlipidemia associated with type 2 diabetes mellitus ([place]) and Overweight (BMI 25.0-29.9) were also pertinent to this visit.   HPI Hazelee P Fein presents for  Chief Complaint  Patient presents with   Follow-up    Follow up on continued weight loss   1) Type 2 DM:  she is taking the minimal dose of Ozempic along with 5 mg Farxiga and 500 mg metformin XR.  Has lost 7  more  lbs since May; does not want to lose anymore.  She has actually  eating sweets more often to gain weight; not working .  Blood sugars have responded and have been elevated   because of her indulgences.  [facility]  [place] [facility]  "[employer] class with Burman Nieves .  Lab Results  Component Value Date   HGBA1C 6.9 (H) 02/14/2022      Outpatient Medications Prior to Visit  Medication Sig Dispense Refill   acetaminophen (TYLENOL) 500 MG tablet Take 2 tablets (1,000 mg total) by mouth every 6 (six) hours. (Patient taking differently: Take 1,000 mg by mouth every 6 (six) hours as needed.) 65 tablet 1   albuterol (PROVENTIL HFA;VENTOLIN HFA) 108 (90 BASE) MCG/ACT inhaler Inhale 2 puffs into the lungs every 6 (six) hours as needed for wheezing or shortness of breath.      Ascorbic Acid 500 MG CHEW Chew by mouth.     atorvastatin (LIPITOR) 40 MG tablet TAKE 1/2 TABLET EVERY DAY  AT  6  PM 45 tablet 1   azelastine (ASTELIN) 0.1 % nasal spray Place 1 spray into both nostrils daily.     benzonatate (TESSALON) 100 MG capsule Take 1 capsule (100 mg total) by mouth 3 (three) times daily as needed for cough. 21 capsule 0   Biotin 5000 MCG TABS Take by mouth 2 (two) times daily.      blood glucose meter kit and supplies Use to check blood sugar twice daily. ICD 10: E11.29 1 each 0   Cetirizine HCl  10 MG CAPS Take by mouth.     Cinnamon Bark POWD Take by mouth.     COLLAGEN PO Take by mouth 2 (two) times daily. For improved fingernail strength     dapagliflozin propanediol (FARXIGA) 10 MG TABS tablet Take 5 mg by mouth daily. Daily     docusate sodium (COLACE) 100 MG capsule Take 1 capsule (100 mg total) by mouth 2 (two) times daily. 20 capsule 0   fluticasone (FLONASE) 50 MCG/ACT nasal spray Place 1 spray into both nostrils daily. 48 g 1   fluticasone (FLOVENT DISKUS) 50 MCG/BLIST diskus inhaler Inhale 1 puff into the lungs 2 (two) times daily.     furosemide (LASIX) 20 MG tablet Take 1 tablet (20 mg total) by mouth 2 (two) times a week. As needed for fluid retention 45 tablet 3   glucose blood (ACCU-CHEK GUIDE) test strip USE 1 STRIP TO TEST BLOOD SUGARS  TWICE DAILY 50 each 0   Lancet Devices (ACCU-CHEK SOFTCLIX) lancets Use to test blood sugars 1 -2 times daily 1 each 3   losartan-hydrochlorothiazide (HYZAAR) 50-12.5 MG tablet TAKE 1 TABLET EVERY DAY 90 tablet 3   Magnesium 250 MG TABS Take 1 tablet by mouth daily.  meclizine (ANTIVERT) 25 MG tablet Take 1 tablet (25 mg total) by mouth 3 (three) times daily as needed for dizziness. 30 tablet 3   metFORMIN (GLUCOPHAGE-XR) 500 MG 24 hr tablet TAKE 1 TABLET EVERY DAY WITH BREAKFAST 90 tablet 1   Multiple Vitamin (MULTIVITAMIN) tablet Take 1 tablet by mouth daily.     Nutritional Supplements (NUTRITIONAL SUPPLEMENT PO) Take 1 tablet by mouth. Taking "Useful Brain" supplement daily     oxybutynin (DITROPAN-XL) 10 MG 24 hr tablet Take 1 tablet (10 mg total) by mouth daily. 90 tablet 3   pantoprazole (PROTONIX) 40 MG tablet Take 1 tablet by mouth twice daily 180 tablet 0   potassium chloride SA (KLOR-CON M) 20 MEQ tablet Take 1 tablet (20 mEq total) by mouth 2 (two) times a week. 45 tablet 1   Probiotic Product (PROBIOTIC-10) CAPS Take 1 capsule by mouth daily.      Semaglutide,0.25 or 0.5MG/DOS, (OZEMPIC, 0.25 OR 0.5 MG/DOSE,) 2 MG/1.5ML  SOPN Inject 0.25 mg into the skin once a week. 3 mL 3   sertraline (ZOLOFT) 50 MG tablet TAKE 1 TABLET EVERY DAY 90 tablet 3   tiZANidine (ZANAFLEX) 4 MG tablet tizanidine 4 mg tablet     verapamil (VERELAN PM) 120 MG 24 hr capsule TAKE 1 CAPSULE EVERY DAY 90 capsule 1   vitamin B-12 (CYANOCOBALAMIN) 1000 MCG tablet Take 1,000 mcg by mouth daily.     Zinc 50 MG TABS Take by mouth daily.     insulin detemir (LEVEMIR) 100 UNIT/ML injection INJECT 18 UNITS SUBCUTANEOUSLY AT BEDTIME. DISCARD EACH VIAL 42 DAYS AFTER OPENING (Patient not taking: Reported on 03/23/2022) 30 mL 1   No facility-administered medications prior to visit.    Review of Systems;  Patient denies headache, fevers, malaise, unintentional weight loss, skin rash, eye pain, sinus congestion and sinus pain, sore throat, dysphagia,  hemoptysis , cough, dyspnea, wheezing, chest pain, palpitations, orthopnea, edema, abdominal pain, nausea, melena, diarrhea, constipation, flank pain, dysuria, hematuria, urinary  Frequency, nocturia, numbness, tingling, seizures,  Focal weakness, Loss of consciousness,  Tremor, insomnia, depression, anxiety, and suicidal ideation.      Objective:  BP 116/62 (BP Location: Left Arm, Patient Position: Sitting, Cuff Size: Normal)   Pulse 65   Temp 97.8 F (36.6 C) (Oral)   Ht '5\' 1"'  (1.549 m)   Wt 134 lb (60.8 kg)   SpO2 97%   BMI 25.32 kg/m   BP Readings from Last 3 Encounters:  03/23/22 116/62  02/14/22 128/70  02/07/22 136/66    Wt Readings from Last 3 Encounters:  03/23/22 134 lb (60.8 kg)  02/14/22 140 lb 9.6 oz (63.8 kg)  02/07/22 141 lb (64 kg)    General appearance: alert, cooperative and appears stated age Ears: normal TM's and external ear canals both ears Throat: lips, mucosa, and tongue normal; teeth and gums normal Neck: no adenopathy, no carotid bruit, supple, symmetrical, trachea midline and thyroid not enlarged, symmetric, no tenderness/mass/nodules Back: symmetric, no  curvature. ROM normal. No CVA tenderness. Lungs: clear to auscultation bilaterally Heart: regular rate and rhythm, S1, S2 normal, no murmur, click, rub or gallop Abdomen: soft, non-tender; bowel sounds normal; no masses,  no organomegaly Pulses: 2+ and symmetric Skin: Skin color, texture, turgor normal. No rashes or lesions Lymph nodes: Cervical, supraclavicular, and axillary nodes normal.  Lab Results  Component Value Date   HGBA1C 6.9 (H) 02/14/2022   HGBA1C 6.4 11/15/2021   HGBA1C 6.7 (H) 08/12/2021    Lab Results  Component Value Date   CREATININE 0.93 02/14/2022   CREATININE 0.99 11/15/2021   CREATININE 0.89 08/12/2021    Lab Results  Component Value Date   WBC 4.8 12/08/2020   HGB 12.0 12/08/2020   HCT 36.2 12/08/2020   PLT 270 12/08/2020   GLUCOSE 96 02/14/2022   CHOL 194 02/14/2022   TRIG 86.0 02/14/2022   HDL 96.80 02/14/2022   LDLDIRECT 66.0 02/14/2022   LDLCALC 80 02/14/2022   ALT 19 02/14/2022   AST 21 02/14/2022   NA 140 02/14/2022   K 3.5 02/14/2022   CL 105 02/14/2022   CREATININE 0.93 02/14/2022   BUN 21 02/14/2022   CO2 27 02/14/2022   TSH 2.95 12/10/2020   INR 1.1 01/23/2009   HGBA1C 6.9 (H) 02/14/2022   MICROALBUR 1.0 11/15/2021    DG Chest 2 View  Result Date: 03/09/2022 CLINICAL DATA:  Fever.  Fatigue.  Loss of appetite. EXAM: CHEST - 2 VIEW COMPARISON:  12/08/2020 FINDINGS: Atherosclerotic calcification of the aortic arch. Small to moderate hiatal hernia. The lungs appear clear. No blunting of the costophrenic angles. Mild thoracic spondylosis. IMPRESSION: 1. No airspace opacity to suggest pneumonia. 2. Hiatal hernia. 3.  Aortic Atherosclerosis (ICD10-I70.0). Electronically Signed   By: Van Clines M.D.   On: 03/09/2022 12:13    Assessment & Plan:   Problem List Items Addressed This Visit     Type 2 DM with CKD stage 3 and hypertension ([place]) - Primary    Advised to stop ozempci and continue metformin and farxiga.   Lab Results   Component Value Date   HGBA1C 6.9 (H) 02/14/2022         Relevant Orders   Hemoglobin A1c   Comprehensive metabolic panel   Hyperlipidemia associated with type 2 diabetes mellitus ([place])   Relevant Orders   Lipid panel   LDL cholesterol, direct   Overweight (BMI 25.0-29.9)    Resolved with ozempic.  Medication to be suspended given her  Ongoing weight loss using the lowest dose.        Follow-up: No follow-ups on file.   Crecencio Mc, MD

## 2022-03-25 NOTE — Assessment & Plan Note (Addendum)
Advised to stop ozempci and continue metformin and farxiga.   Lab Results  Component Value Date   HGBA1C 6.9 (H) 02/14/2022

## 2022-03-26 NOTE — Assessment & Plan Note (Signed)
Resolved with ozempic.  Medication to be suspended given her  Ongoing weight loss using the lowest dose.

## 2022-04-05 ENCOUNTER — Encounter: Payer: Medicare PPO | Admitting: Obstetrics and Gynecology

## 2022-04-16 ENCOUNTER — Other Ambulatory Visit: Payer: Self-pay | Admitting: Internal Medicine

## 2022-04-20 DIAGNOSIS — E785 Hyperlipidemia, unspecified: Secondary | ICD-10-CM | POA: Diagnosis not present

## 2022-04-20 DIAGNOSIS — F3342 Major depressive disorder, recurrent, in full remission: Secondary | ICD-10-CM | POA: Diagnosis not present

## 2022-04-20 DIAGNOSIS — E1122 Type 2 diabetes mellitus with diabetic chronic kidney disease: Secondary | ICD-10-CM | POA: Diagnosis not present

## 2022-04-20 DIAGNOSIS — M810 Age-related osteoporosis without current pathological fracture: Secondary | ICD-10-CM | POA: Diagnosis not present

## 2022-04-20 DIAGNOSIS — J449 Chronic obstructive pulmonary disease, unspecified: Secondary | ICD-10-CM | POA: Diagnosis not present

## 2022-04-20 DIAGNOSIS — E1151 Type 2 diabetes mellitus with diabetic peripheral angiopathy without gangrene: Secondary | ICD-10-CM | POA: Diagnosis not present

## 2022-04-20 DIAGNOSIS — E876 Hypokalemia: Secondary | ICD-10-CM | POA: Diagnosis not present

## 2022-04-20 DIAGNOSIS — F419 Anxiety disorder, unspecified: Secondary | ICD-10-CM | POA: Diagnosis not present

## 2022-04-20 DIAGNOSIS — I10 Essential (primary) hypertension: Secondary | ICD-10-CM | POA: Diagnosis not present

## 2022-04-29 ENCOUNTER — Other Ambulatory Visit: Payer: Self-pay

## 2022-05-02 ENCOUNTER — Ambulatory Visit: Payer: Self-pay | Admitting: Urology

## 2022-05-03 DIAGNOSIS — H5711 Ocular pain, right eye: Secondary | ICD-10-CM | POA: Diagnosis not present

## 2022-05-03 DIAGNOSIS — H52223 Regular astigmatism, bilateral: Secondary | ICD-10-CM | POA: Diagnosis not present

## 2022-05-03 DIAGNOSIS — H524 Presbyopia: Secondary | ICD-10-CM | POA: Diagnosis not present

## 2022-05-03 DIAGNOSIS — H04123 Dry eye syndrome of bilateral lacrimal glands: Secondary | ICD-10-CM | POA: Diagnosis not present

## 2022-05-03 DIAGNOSIS — H5203 Hypermetropia, bilateral: Secondary | ICD-10-CM | POA: Diagnosis not present

## 2022-05-10 DIAGNOSIS — I10 Essential (primary) hypertension: Secondary | ICD-10-CM | POA: Diagnosis not present

## 2022-05-10 DIAGNOSIS — R600 Localized edema: Secondary | ICD-10-CM | POA: Diagnosis not present

## 2022-05-10 DIAGNOSIS — N182 Chronic kidney disease, stage 2 (mild): Secondary | ICD-10-CM | POA: Diagnosis not present

## 2022-05-10 DIAGNOSIS — E1122 Type 2 diabetes mellitus with diabetic chronic kidney disease: Secondary | ICD-10-CM | POA: Diagnosis not present

## 2022-05-12 ENCOUNTER — Other Ambulatory Visit: Payer: Self-pay | Admitting: *Deleted

## 2022-05-12 MED ORDER — OXYBUTYNIN CHLORIDE ER 10 MG PO TB24: 10.0000 mg | ORAL_TABLET | Freq: Every day | ORAL | 0 refills | Status: DC

## 2022-05-19 ENCOUNTER — Encounter: Payer: Self-pay | Admitting: Internal Medicine

## 2022-05-19 ENCOUNTER — Ambulatory Visit: Payer: Medicare PPO | Admitting: Internal Medicine

## 2022-05-19 VITALS — BP 116/66 | HR 63 | Temp 97.5°F | Ht 61.0 in | Wt 133.0 lb

## 2022-05-19 DIAGNOSIS — E1122 Type 2 diabetes mellitus with diabetic chronic kidney disease: Secondary | ICD-10-CM

## 2022-05-19 DIAGNOSIS — N183 Chronic kidney disease, stage 3 unspecified: Secondary | ICD-10-CM

## 2022-05-19 DIAGNOSIS — I129 Hypertensive chronic kidney disease with stage 1 through stage 4 chronic kidney disease, or unspecified chronic kidney disease: Secondary | ICD-10-CM

## 2022-05-19 LAB — COMPREHENSIVE METABOLIC PANEL
ALT: 24 U/L (ref 0–35)
AST: 26 U/L (ref 0–37)
Albumin: 4.5 g/dL (ref 3.5–5.2)
Alkaline Phosphatase: 91 U/L (ref 39–117)
BUN: 24 mg/dL — ABNORMAL HIGH (ref 6–23)
CO2: 30 mEq/L (ref 19–32)
Calcium: 9.6 mg/dL (ref 8.4–10.5)
Chloride: 98 mEq/L (ref 96–112)
Creatinine, Ser: 0.94 mg/dL (ref 0.40–1.20)
GFR: 58.28 mL/min — ABNORMAL LOW (ref >60.00)
Glucose, Bld: 142 mg/dL — ABNORMAL HIGH (ref 70–99)
Potassium: 3.8 mEq/L (ref 3.5–5.1)
Sodium: 137 mEq/L (ref 135–145)
Total Bilirubin: 0.7 mg/dL (ref 0.2–1.2)
Total Protein: 7 g/dL (ref 6.0–8.3)

## 2022-05-19 LAB — HEMOGLOBIN A1C: Hgb A1c MFr Bld: 8 % — ABNORMAL HIGH (ref 4.6–6.5)

## 2022-05-19 MED ORDER — EMPAGLIFLOZIN 25 MG PO TABS
25.0000 mg | ORAL_TABLET | Freq: Every day | ORAL | 0 refills | Status: DC
Start: 2022-05-19 — End: 2022-06-28

## 2022-05-19 NOTE — Patient Instructions (Addendum)
Instead of the mucus tablet, try taking Mucinex 600 mg at bedtime and drink a glass of warm water  or tea with lemon in the morning   For the brain,  you can try  B12 25000 mcg daily plus gingko biloba   200 mg daily       For the diabetes,  we are checking a1c today  Suspend the ozempic for now   [place].  YOU MAY STILL CUT THE TABLET IN HALF.  IT HAS BEEN SENT TO MAIL ORDER FOR FUTURE REFILL

## 2022-05-19 NOTE — Assessment & Plan Note (Addendum)
Her use of Ozempic has resulted in erratic eating habits and increased sugar consumption to manage the weight loss ; she now has,  ironically, an elevated A1c Advised to stop ozempic and continue metformin and farxiga.   Lab Results  Component Value Date   HGBA1C 8.0 (H) 05/19/2022

## 2022-05-19 NOTE — Progress Notes (Signed)
Subjective:  Patient ID: Sandra Hendrix, female    DOB: Aug 31, 1946  Age: 76 y.o. MRN: [id number]  CC: The encounter diagnosis was Type 2 DM with CKD stage 3 and hypertension ([place]).   [name] presents for 3 month follow up  Chief Complaint  Patient presents with   Follow-up   Type 2 DM:  seen June 28,  she was advised to stop the ozempic  due to excessive weight loss  and excessive intake of sugars to counteract the weight loss.  She did not stop it,  she reduced the dose to 0.25  mg weekly,  and is  eating less sugar . Taking metformin and [place] but cutting Farxiga in half due to cost .  Weight down 1 lb over the last 6 weeks.    New growth I the perineal area, that has enlarged over the last several months  Outpatient Medications Prior to Visit  Medication Sig Dispense Refill   acetaminophen (TYLENOL) 500 MG tablet Take 2 tablets (1,000 mg total) by mouth every 6 (six) hours. (Patient taking differently: Take 1,000 mg by mouth every 6 (six) hours as needed.) 65 tablet 1   albuterol (PROVENTIL HFA;VENTOLIN HFA) 108 (90 BASE) MCG/ACT inhaler Inhale 2 puffs into the lungs every 6 (six) hours as needed for wheezing or shortness of breath.      Ascorbic Acid 500 MG CHEW Chew by mouth.     atorvastatin (LIPITOR) 40 MG tablet TAKE 1/2 TABLET EVERY DAY  AT  6  PM 45 tablet 1   azelastine (ASTELIN) 0.1 % nasal spray Place 1 spray into both nostrils daily.     benzonatate (TESSALON) 100 MG capsule Take 1 capsule (100 mg total) by mouth 3 (three) times daily as needed for cough. 21 capsule 0   Biotin 5000 MCG TABS Take by mouth daily.     blood glucose meter kit and supplies Use to check blood sugar twice daily. ICD 10: E11.29 1 each 0   Cetirizine HCl 10 MG CAPS Take by mouth.     Cinnamon Bark POWD Take by mouth.     COLLAGEN PO Take by mouth daily. For improved fingernail strength     docusate sodium (COLACE) 100 MG capsule Take 1 capsule (100 mg total) by mouth 2 (two)  times daily. 20 capsule 0   fluticasone (FLONASE) 50 MCG/ACT nasal spray Place 1 spray into both nostrils daily. 48 g 1   fluticasone (FLOVENT DISKUS) 50 MCG/BLIST diskus inhaler Inhale 1 puff into the lungs 2 (two) times daily.     furosemide (LASIX) 20 MG tablet Take 1 tablet (20 mg total) by mouth 2 (two) times a week. As needed for fluid retention 45 tablet 3   glucose blood (ACCU-CHEK GUIDE) test strip USE 1 STRIP TO TEST BLOOD SUGARS  TWICE DAILY 50 each 0   Lancet Devices (ACCU-CHEK SOFTCLIX) lancets Use to test blood sugars 1 -2 times daily 1 each 3   losartan-hydrochlorothiazide (HYZAAR) 50-12.5 MG tablet TAKE 1 TABLET EVERY DAY 90 tablet 3   Magnesium 250 MG TABS Take 1 tablet by mouth daily.     meclizine (ANTIVERT) 25 MG tablet Take 1 tablet (25 mg total) by mouth 3 (three) times daily as needed for dizziness. 30 tablet 3   metFORMIN (GLUCOPHAGE-XR) 500 MG 24 hr tablet TAKE 1 TABLET EVERY DAY WITH BREAKFAST 90 tablet 1   Multiple Vitamin (MULTIVITAMIN) tablet Take 1 tablet by mouth daily.  Nutritional Supplements (NUTRITIONAL SUPPLEMENT PO) Take 1 tablet by mouth. Taking "Useful Brain" supplement daily     oxybutynin (DITROPAN-XL) 10 MG 24 hr tablet Take 1 tablet (10 mg total) by mouth daily. 90 tablet 0   pantoprazole (PROTONIX) 40 MG tablet Take 1 tablet by mouth twice daily 180 tablet 0   potassium chloride SA (KLOR-CON M) 20 MEQ tablet Take 1 tablet (20 mEq total) by mouth 2 (two) times a week. 45 tablet 1   Probiotic Product (PROBIOTIC-10) CAPS Take 1 capsule by mouth daily.      Semaglutide,0.25 or 0.5MG/DOS, (OZEMPIC, 0.25 OR 0.5 MG/DOSE,) 2 MG/1.5ML SOPN Inject 0.25 mg into the skin once a week. 3 mL 3   sertraline (ZOLOFT) 50 MG tablet TAKE 1 TABLET EVERY DAY 90 tablet 3   tiZANidine (ZANAFLEX) 4 MG tablet tizanidine 4 mg tablet     verapamil (VERELAN PM) 120 MG 24 hr capsule TAKE 1 CAPSULE EVERY DAY 90 capsule 1   vitamin B-12 (CYANOCOBALAMIN) 1000 MCG tablet Take 1,000  mcg by mouth daily.     Zinc 50 MG TABS Take by mouth daily.     dapagliflozin propanediol (FARXIGA) 10 MG TABS tablet Take 5 mg by mouth daily. Daily     insulin detemir (LEVEMIR) 100 UNIT/ML injection INJECT 18 UNITS SUBCUTANEOUSLY AT BEDTIME. DISCARD EACH VIAL 42 DAYS AFTER OPENING (Patient not taking: Reported on 03/23/2022) 30 mL 1   No facility-administered medications prior to visit.    Review of Systems;  Patient denies headache, fevers, malaise, unintentional weight loss, skin rash, eye pain, sinus congestion and sinus pain, sore throat, dysphagia,  hemoptysis , cough, dyspnea, wheezing, chest pain, palpitations, orthopnea, edema, abdominal pain, nausea, melena, diarrhea, constipation, flank pain, dysuria, hematuria, urinary  Frequency, nocturia, numbness, tingling, seizures,  Focal weakness, Loss of consciousness,  Tremor, insomnia, depression, anxiety, and suicidal ideation.      Objective:  BP 116/66 (BP Location: Left Arm, Patient Position: Sitting, Cuff Size: Normal)   Pulse 63   Temp (!) 97.5 F (36.4 C) (Oral)   Ht '5\' 1"'  (1.549 m)   Wt 133 lb (60.3 kg)   SpO2 96%   BMI 25.13 kg/m   BP Readings from Last 3 Encounters:  05/19/22 116/66  03/23/22 116/62  02/14/22 128/70    Wt Readings from Last 3 Encounters:  05/19/22 133 lb (60.3 kg)  03/23/22 134 lb (60.8 kg)  02/14/22 140 lb 9.6 oz (63.8 kg)    General appearance: alert, cooperative and appears stated age Ears: normal TM's and external ear canals both ears Throat: lips, mucosa, and tongue normal; teeth and gums normal Neck: no adenopathy, no carotid bruit, supple, symmetrical, trachea midline and thyroid not enlarged, symmetric, no tenderness/mass/nodules Back: symmetric, no curvature. ROM normal. No CVA tenderness. Lungs: clear to auscultation bilaterally Heart: regular rate and rhythm, S1, S2 normal, no murmur, click, rub or gallop Abdomen: soft, non-tender; bowel sounds normal; no masses,  no  organomegaly Pulses: 2+ and symmetric Skin: Skin color, texture, turgor normal. No rashes or lesions Lymph nodes: Cervical, supraclavicular, and axillary nodes normal. GU: small external hemorrhoid, non thrombosed.  Lab Results  Component Value Date   HGBA1C 8.0 (H) 05/19/2022   HGBA1C 6.9 (H) 02/14/2022   HGBA1C 6.4 11/15/2021    Lab Results  Component Value Date   CREATININE 0.94 05/19/2022   CREATININE 0.93 02/14/2022   CREATININE 0.99 11/15/2021    Lab Results  Component Value Date   WBC 4.8 12/08/2020  HGB 12.0 12/08/2020   HCT 36.2 12/08/2020   PLT 270 12/08/2020   GLUCOSE 142 (H) 05/19/2022   CHOL 194 02/14/2022   TRIG 86.0 02/14/2022   HDL 96.80 02/14/2022   LDLDIRECT 66.0 02/14/2022   LDLCALC 80 02/14/2022   ALT 24 05/19/2022   AST 26 05/19/2022   NA 137 05/19/2022   K 3.8 05/19/2022   CL 98 05/19/2022   CREATININE 0.94 05/19/2022   BUN 24 (H) 05/19/2022   CO2 30 05/19/2022   TSH 2.95 12/10/2020   INR 1.1 01/23/2009   HGBA1C 8.0 (H) 05/19/2022   MICROALBUR 1.0 11/15/2021    DG Chest 2 View  Result Date: 03/09/2022 CLINICAL DATA:  Fever.  Fatigue.  Loss of appetite. EXAM: CHEST - 2 VIEW COMPARISON:  12/08/2020 FINDINGS: Atherosclerotic calcification of the aortic arch. Small to moderate hiatal hernia. The lungs appear clear. No blunting of the costophrenic angles. Mild thoracic spondylosis. IMPRESSION: 1. No airspace opacity to suggest pneumonia. 2. Hiatal hernia. 3.  Aortic Atherosclerosis (ICD10-I70.0). Electronically Signed   By: Van Clines M.D.   On: 03/09/2022 12:13    Assessment & Plan:   Problem List Items Addressed This Visit     Type 2 DM with CKD stage 3 and hypertension ([place]) - Primary    Her use of Ozempic has resulted in erratic eating habits and increased sugar consumption to manage the weight loss ; she now has,  ironically, an elevated A1c Advised to stop ozempic and continue metformin and farxiga.   Lab Results  Component  Value Date   HGBA1C 8.0 (H) 05/19/2022         Relevant Medications   empagliflozin (JARDIANCE) 25 MG TABS tablet   Other Relevant Orders   Hemoglobin A1c (Completed)   Comprehensive metabolic panel (Completed)   Lipid Panel w/reflex Direct LDL     Follow-up: No follow-ups on file.   Crecencio Mc, MD

## 2022-05-24 ENCOUNTER — Other Ambulatory Visit: Payer: Medicare PPO

## 2022-06-02 ENCOUNTER — Encounter: Payer: Medicare PPO | Admitting: Obstetrics and Gynecology

## 2022-06-02 DIAGNOSIS — E119 Type 2 diabetes mellitus without complications: Secondary | ICD-10-CM | POA: Diagnosis not present

## 2022-06-02 DIAGNOSIS — H524 Presbyopia: Secondary | ICD-10-CM | POA: Diagnosis not present

## 2022-06-02 DIAGNOSIS — H52223 Regular astigmatism, bilateral: Secondary | ICD-10-CM | POA: Diagnosis not present

## 2022-06-02 DIAGNOSIS — H5203 Hypermetropia, bilateral: Secondary | ICD-10-CM | POA: Diagnosis not present

## 2022-06-02 DIAGNOSIS — H04123 Dry eye syndrome of bilateral lacrimal glands: Secondary | ICD-10-CM | POA: Diagnosis not present

## 2022-06-02 DIAGNOSIS — H1131 Conjunctival hemorrhage, right eye: Secondary | ICD-10-CM | POA: Diagnosis not present

## 2022-06-02 DIAGNOSIS — Z7984 Long term (current) use of oral hypoglycemic drugs: Secondary | ICD-10-CM | POA: Diagnosis not present

## 2022-06-02 DIAGNOSIS — Z961 Presence of intraocular lens: Secondary | ICD-10-CM | POA: Diagnosis not present

## 2022-06-03 LAB — HM DIABETES EYE EXAM

## 2022-06-20 ENCOUNTER — Other Ambulatory Visit: Payer: Self-pay

## 2022-06-20 ENCOUNTER — Ambulatory Visit: Payer: Medicare PPO | Admitting: Urology

## 2022-06-20 VITALS — BP 113/68 | HR 68 | Ht 61.0 in | Wt 138.0 lb

## 2022-06-20 DIAGNOSIS — N3946 Mixed incontinence: Secondary | ICD-10-CM | POA: Diagnosis not present

## 2022-06-20 LAB — URINALYSIS, COMPLETE
Bilirubin, UA: NEGATIVE
Ketones, UA: NEGATIVE
Leukocytes,UA: NEGATIVE
Nitrite, UA: NEGATIVE
Protein,UA: NEGATIVE
RBC, UA: NEGATIVE
Specific Gravity, UA: 1.015 (ref 1.005–1.030)
Urobilinogen, Ur: 0.2 mg/dL (ref 0.2–1.0)
pH, UA: 5.5 (ref 5.0–7.5)

## 2022-06-20 LAB — MICROSCOPIC EXAMINATION: Bacteria, UA: NONE SEEN

## 2022-06-20 MED ORDER — OXYBUTYNIN CHLORIDE ER 10 MG PO TB24: 10.0000 mg | ORAL_TABLET | Freq: Every day | ORAL | 3 refills | Status: DC

## 2022-06-20 NOTE — Progress Notes (Signed)
06/20/2022 1:55 PM   Sandra Hendrix 03-29-1946 [id number]  Referring provider: Crecencio Mc, MD [address] [place],  [place] [address]  Chief Complaint  Patient presents with   Follow-up    HPI: Reviewed long note.  Patient was doing very well 1 year ago on oxybutynin.  Doing very well on oxybutynin.  No infections   PMH: Past Medical History:  Diagnosis Date   Allergy    Anxiety    Arthritis    Barrett's esophagus determined by endoscopy    Cancer ([place])    Uterine Cancer   Chronic kidney disease    Chronic venous insufficiency    Cough variant asthma 02/07/2022   COVID-19 virus infection 07/21/2021   NOTIFIED ON OCT 26 . FEVERS BODY ACHES,  HEADACHES.  SENDING IN [facility]OLNUPIRAVIR   Depression    Diabetes mellitus without complication ([place])    Difficult intubation    "not sure what happened,asleep was told had problem with intubation"   Diverticulosis    Fatty liver    GERD (gastroesophageal reflux disease)    History of bronchitis    History of hiatal hernia    Hyperlipidemia    Hypertension    Irritable bowel syndrome with constipation    Sleep apnea    Use C- PAP   Urinary incontinence    Varicose veins     Surgical History: Past Surgical History:  Procedure Laterality Date   CARDIAC CATHETERIZATION     CHOLECYSTECTOMY     COLONOSCOPY WITH PROPOFOL N/A 06/14/2016   Procedure: COLONOSCOPY WITH PROPOFOL;  Surgeon: Lollie Sails, MD;  Location: [facility] ENDOSCOPY;  Service: Endoscopy;  Laterality: N/A;   COLONOSCOPY WITH PROPOFOL N/A 12/18/2020   Procedure: COLONOSCOPY WITH PROPOFOL;  Surgeon: Lesly Rubenstein, MD;  Location: [facility] ENDOSCOPY;  Service: Endoscopy;  Laterality: N/A;   COLONOSCOPY WITH PROPOFOL N/A 12/17/2021   Procedure: COLONOSCOPY WITH PROPOFOL;  Surgeon: Annamaria Helling, DO;  Location: [facility] ENDOSCOPY;  Service: Gastroenterology;  Laterality: N/A;  DM   ESOPHAGOGASTRODUODENOSCOPY N/A 12/18/2020   Procedure:  ESOPHAGOGASTRODUODENOSCOPY (EGD);  Surgeon: Lesly Rubenstein, MD;  Location: [facility] ENDOSCOPY;  Service: Endoscopy;  Laterality: N/A;   ESOPHAGOGASTRODUODENOSCOPY (EGD) WITH PROPOFOL N/A 06/14/2016   Procedure: ESOPHAGOGASTRODUODENOSCOPY (EGD) WITH PROPOFOL;  Surgeon: Lollie Sails, MD;  Location: [facility] ENDOSCOPY;  Service: Endoscopy;  Laterality: N/A;   ESOPHAGOGASTRODUODENOSCOPY (EGD) WITH PROPOFOL N/A 06/12/2017   Procedure: ESOPHAGOGASTRODUODENOSCOPY (EGD) WITH PROPOFOL;  Surgeon: Lollie Sails, MD;  Location: [facility] ENDOSCOPY;  Service: Endoscopy;  Laterality: N/A;   ESOPHAGOGASTRODUODENOSCOPY (EGD) WITH PROPOFOL N/A 08/25/2017   Procedure: ESOPHAGOGASTRODUODENOSCOPY (EGD) WITH PROPOFOL;  Surgeon: Lollie Sails, MD;  Location: [facility] ENDOSCOPY;  Service: Endoscopy;  Laterality: N/A;   HYSTEROSCOPY WITH D & C N/A 01/02/2017   Procedure: DILATATION AND CURETTAGE /HYSTEROSCOPY;  Surgeon: Brayton Mars, MD;  Location: [facility] ORS;  Service: Gynecology;  Laterality: N/A;   JOINT REPLACEMENT Right 2007   Total Knee Replacement   JOINT REPLACEMENT Left 2004   Total Knee Replacement   LAPAROSCOPIC HYSTERECTOMY Bilateral 03/01/2017   Procedure: HYSTERECTOMY TOTAL LAPAROSCOPIC BSO;  Surgeon: Mellody Drown, MD;  Location: [facility] ORS;  Service: Gynecology;  Laterality: Bilateral;   ROTATOR CUFF REPAIR Right    SENTINEL NODE BIOPSY N/A 03/01/2017   Procedure: SENTINEL NODE INJECTION AND BIOPSY;  Surgeon: Mellody Drown, MD;  Location: [facility] ORS;  Service: Gynecology;  Laterality: N/A;   TONSILLECTOMY     as a child   TUBAL  LIGATION     UPPER GI ENDOSCOPY     x2    Home Medications:  Allergies as of 06/20/2022       Reactions   Clindamycin/lincomycin Rash   Codeine Rash   Lincomycin Rash   Morphine And Related Nausea Only   Talwin [pentazocine] Nausea And Vomiting   Clindamycin Other (See Comments)   Morphine Other (See Comments)   Lisinopril Cough   cough   Naloxone Other (See  Comments)   Had issues with sedation and vomiting with EGD   Oxycodone-acetaminophen Other (See Comments)   Pt states this medication makes her bowels stop moving   Warfarin And Related Nausea Only        Medication List        Accurate as of June 20, 2022  1:55 PM. If you have any questions, ask your nurse or doctor.          STOP taking these medications    Ozempic (0.25 or 0.5 MG/DOSE) 2 MG/1.5ML Sopn Generic drug: Semaglutide(0.25 or 0.5MG/DOS)       TAKE these medications    Accu-Chek Guide test strip Generic drug: glucose blood USE 1 STRIP TO TEST BLOOD SUGARS  TWICE DAILY   accu-chek softclix lancets Use to test blood sugars 1 -2 times daily   acetaminophen 500 MG tablet Commonly known as: TYLENOL Take 2 tablets (1,000 mg total) by mouth every 6 (six) hours. What changed:  when to take this reasons to take this   albuterol 108 (90 Base) MCG/ACT inhaler Commonly known as: VENTOLIN HFA Inhale 2 puffs into the lungs every 6 (six) hours as needed for wheezing or shortness of breath.   Ascorbic Acid 500 MG Chew Chew by mouth.   atorvastatin 40 MG tablet Commonly known as: LIPITOR TAKE 1/2 TABLET EVERY DAY  AT  6  PM   azelastine 0.1 % nasal spray Commonly known as: ASTELIN Place 1 spray into both nostrils daily.   benzonatate 100 MG capsule Commonly known as: TESSALON Take 1 capsule (100 mg total) by mouth 3 (three) times daily as needed for cough.   Biotin 5000 MCG Tabs Take by mouth daily.   blood glucose meter kit and supplies Use to check blood sugar twice daily. ICD 10: E11.29   Cetirizine HCl 10 MG Caps Take by mouth.   Cinnamon Bark Powd Take by mouth.   COLLAGEN PO Take by mouth daily. For improved fingernail strength   cyanocobalamin 1000 MCG tablet Commonly known as: VITAMIN B12 Take 1,000 mcg by mouth daily.   docusate sodium 100 MG capsule Commonly known as: Colace Take 1 capsule (100 mg total) by mouth 2 (two)  times daily.   empagliflozin 25 MG Tabs tablet Commonly known as: Jardiance Take 1 tablet (25 mg total) by mouth daily.   fluticasone 50 MCG/ACT nasal spray Commonly known as: FLONASE Place 1 spray into both nostrils daily.   fluticasone 50 MCG/BLIST diskus inhaler Commonly known as: FLOVENT DISKUS Inhale 1 puff into the lungs 2 (two) times daily.   furosemide 20 MG tablet Commonly known as: LASIX Take 1 tablet (20 mg total) by mouth 2 (two) times a week. As needed for fluid retention   losartan-hydrochlorothiazide 50-12.5 MG tablet Commonly known as: HYZAAR TAKE 1 TABLET EVERY DAY   Magnesium 250 MG Tabs Take 1 tablet by mouth daily.   meclizine 25 MG tablet Commonly known as: ANTIVERT Take 1 tablet (25 mg total) by mouth 3 (three) times daily as  needed for dizziness.   metFORMIN 500 MG 24 hr tablet Commonly known as: GLUCOPHAGE-XR TAKE 1 TABLET EVERY DAY WITH BREAKFAST   multivitamin tablet Take 1 tablet by mouth daily.   NUTRITIONAL SUPPLEMENT PO Take 1 tablet by mouth. Taking "Useful Brain" supplement daily   oxybutynin 10 MG 24 hr tablet Commonly known as: DITROPAN-XL Take 1 tablet (10 mg total) by mouth daily.   pantoprazole 40 MG tablet Commonly known as: PROTONIX Take 1 tablet by mouth twice daily   potassium chloride SA 20 MEQ tablet Commonly known as: KLOR-CON M Take 1 tablet (20 mEq total) by mouth 2 (two) times a week.   Probiotic-10 Caps Take 1 capsule by mouth daily.   sertraline 50 MG tablet Commonly known as: ZOLOFT TAKE 1 TABLET EVERY DAY   tiZANidine 4 MG tablet Commonly known as: ZANAFLEX tizanidine 4 mg tablet   verapamil 120 MG 24 hr capsule Commonly known as: VERELAN PM TAKE 1 CAPSULE EVERY DAY   Zinc 50 MG Tabs Take by mouth daily.        Allergies:  Allergies  Allergen Reactions   Clindamycin/Lincomycin Rash   Codeine Rash   Lincomycin Rash   Morphine And Related Nausea Only   Talwin [Pentazocine] Nausea And  Vomiting   Clindamycin Other (See Comments)   Morphine Other (See Comments)   Lisinopril Cough    cough   Naloxone Other (See Comments)    Had issues with sedation and vomiting with EGD   Oxycodone-Acetaminophen Other (See Comments)    Pt states this medication makes her bowels stop moving   Warfarin And Related Nausea Only    Family History: Family History  Problem Relation Age of Onset   Stroke Mother    Hypertension Mother    Lupus Mother    Alcohol abuse Father    Diabetes Father    Cancer Father        gallbladder ca into liver    Arthritis Sister    Diabetes Sister    Lumbar disc disease Sister    Hyperlipidemia Daughter    Hypertension Daughter    Cancer Maternal Aunt        ovarian ca   Diabetes Paternal Aunt    Drug abuse Paternal Aunt    Cancer Maternal Grandfather        colon ca   Hypertension Brother    Alcohol abuse Brother    Stroke Brother     Social History:  reports that she has never smoked. She has never used smokeless tobacco. She reports that she does not currently use alcohol. She reports that she does not use drugs.  ROS:                                        Physical Exam: BP 113/68   Pulse 68   Ht 5' 1" (1.549 m)   Wt 62.6 kg   BMI 26.07 kg/m   Constitutional:  Alert and oriented, No acute distress. HEENT: [place] AT, moist mucus membranes.  Trachea midline, no masses.   Laboratory Data: Lab Results  Component Value Date   WBC 4.8 12/08/2020   HGB 12.0 12/08/2020   HCT 36.2 12/08/2020   MCV 89.4 12/08/2020   PLT 270 12/08/2020    Lab Results  Component Value Date   CREATININE 0.94 05/19/2022    No results found for: "PSA"  No  results found for: "TESTOSTERONE"  Lab Results  Component Value Date   HGBA1C 8.0 (H) 05/19/2022    Urinalysis    Component Value Date/Time   COLORURINE YELLOW 10/05/2016 1003   APPEARANCEUR Clear 09/30/2019 1257   LABSPEC 1.010 10/05/2016 1003   PHURINE 6.0  10/05/2016 1003   GLUCOSEU Negative 09/30/2019 1257   GLUCOSEU NEGATIVE 10/05/2016 1003   HGBUR NEGATIVE 10/05/2016 1003   BILIRUBINUR Negative 09/30/2019 1257   KETONESUR NEGATIVE 10/05/2016 1003   PROTEINUR Negative 09/30/2019 1257   PROTEINUR 30 (A) 02/20/2015 2137   UROBILINOGEN 0.2 05/26/2017 1122   UROBILINOGEN 0.2 10/05/2016 1003   NITRITE Negative 09/30/2019 1257   NITRITE NEGATIVE 10/05/2016 1003   LEUKOCYTESUR Negative 09/30/2019 1257    Pertinent Imaging:   Assessment & Plan: 90x3 sent to pharmacy and I will see in 1 year  1. Mixed incontinence  - Urinalysis, Complete   No follow-ups on file.  Reece Packer, MD  [facility] [address] [place], [place] [address] [phone]

## 2022-06-23 ENCOUNTER — Other Ambulatory Visit: Payer: Medicare PPO

## 2022-06-27 ENCOUNTER — Other Ambulatory Visit (INDEPENDENT_AMBULATORY_CARE_PROVIDER_SITE_OTHER): Payer: Medicare PPO

## 2022-06-27 DIAGNOSIS — I129 Hypertensive chronic kidney disease with stage 1 through stage 4 chronic kidney disease, or unspecified chronic kidney disease: Secondary | ICD-10-CM | POA: Diagnosis not present

## 2022-06-27 DIAGNOSIS — N183 Chronic kidney disease, stage 3 unspecified: Secondary | ICD-10-CM

## 2022-06-27 DIAGNOSIS — E1122 Type 2 diabetes mellitus with diabetic chronic kidney disease: Secondary | ICD-10-CM

## 2022-06-28 ENCOUNTER — Encounter: Payer: Self-pay | Admitting: Internal Medicine

## 2022-06-28 ENCOUNTER — Telehealth: Payer: Self-pay | Admitting: Internal Medicine

## 2022-06-28 ENCOUNTER — Ambulatory Visit: Payer: Medicare PPO | Admitting: Internal Medicine

## 2022-06-28 VITALS — BP 116/64 | HR 67 | Temp 97.4°F | Ht 61.0 in | Wt 134.8 lb

## 2022-06-28 DIAGNOSIS — Z79899 Other long term (current) drug therapy: Secondary | ICD-10-CM | POA: Diagnosis not present

## 2022-06-28 DIAGNOSIS — E1169 Type 2 diabetes mellitus with other specified complication: Secondary | ICD-10-CM | POA: Diagnosis not present

## 2022-06-28 DIAGNOSIS — Z1231 Encounter for screening mammogram for malignant neoplasm of breast: Secondary | ICD-10-CM | POA: Diagnosis not present

## 2022-06-28 DIAGNOSIS — E785 Hyperlipidemia, unspecified: Secondary | ICD-10-CM

## 2022-06-28 DIAGNOSIS — N183 Chronic kidney disease, stage 3 unspecified: Secondary | ICD-10-CM

## 2022-06-28 DIAGNOSIS — I10 Essential (primary) hypertension: Secondary | ICD-10-CM | POA: Diagnosis not present

## 2022-06-28 DIAGNOSIS — R300 Dysuria: Secondary | ICD-10-CM | POA: Insufficient documentation

## 2022-06-28 DIAGNOSIS — I129 Hypertensive chronic kidney disease with stage 1 through stage 4 chronic kidney disease, or unspecified chronic kidney disease: Secondary | ICD-10-CM | POA: Diagnosis not present

## 2022-06-28 DIAGNOSIS — E1122 Type 2 diabetes mellitus with diabetic chronic kidney disease: Secondary | ICD-10-CM

## 2022-06-28 LAB — LIPID PANEL W/REFLEX DIRECT LDL
Cholesterol: 206 mg/dL — ABNORMAL HIGH (ref <200)
HDL: 110 mg/dL (ref >=50)
LDL Cholesterol (Calc): 77 mg/dL (calc)
Non-HDL Cholesterol (Calc): 96 mg/dL (calc) (ref <130)
Total CHOL/HDL Ratio: 1.9 (calc) (ref <5.0)
Triglycerides: 106 mg/dL (ref <150)

## 2022-06-28 MED ORDER — EMPAGLIFLOZIN 25 MG PO TABS: 25.0000 mg | ORAL_TABLET | Freq: Every day | ORAL | 0 refills | Status: DC

## 2022-06-28 MED ORDER — ACCU-CHEK GUIDE VI STRP: ORAL_STRIP | 5 refills | Status: DC

## 2022-06-28 MED ORDER — GLIPIZIDE ER 2.5 MG PO TB24: 2.5000 mg | ORAL_TABLET | Freq: Every day | ORAL | 1 refills | Status: DC

## 2022-06-28 NOTE — Assessment & Plan Note (Signed)
No evidence of UTI by Sept 25 UA done by U rology. Repeating today ,  3+ Glucosuria noted:  Jardiance? Or uncontrolled diabetes.  Repeating urine today

## 2022-06-28 NOTE — Addendum Note (Signed)
Addended by: Crecencio Mc on: 06/28/2022 05:13 PM   Modules accepted: Orders

## 2022-06-28 NOTE — Assessment & Plan Note (Addendum)
Continue metformin and start jardiance . Will hold off on adding glipizide 2.5 mg daily since jardiance was not started yet.  Marland Kitchen ozempic stopped due to excessive weight loss resulting in nonadherence to diabetic diet and subsequent rise in A1c.  Return  In late November

## 2022-06-28 NOTE — Telephone Encounter (Signed)
Pt called stating she was prescribed jardiance through the mail but it was canceled because they was no milligram on it and pt wanted the provider to know that she when she clean her teeth she need antibiotic because in September she had two knee replacements

## 2022-06-28 NOTE — Patient Instructions (Addendum)
I am adding a low dose of glipzide to take once daily WHEN YOU EAT YOUR MORNING MEAL.   PLEASE CONTINUE METFORMIN AND JARDIANCE  CONTINUE TO CHECK:  Fasting sugars before any food  OR After meal sugars need to be AFTER 2 HOURS.    REPEAT your a1c and other LABS ON OR AFTER NOV 25

## 2022-06-28 NOTE — Telephone Encounter (Signed)
Impossible!  The rx cannot be completed withotu a dose.  I sent it again

## 2022-06-28 NOTE — Telephone Encounter (Signed)
Spoke with pt and she stated that she has not been taking the Jardiance because for some reason the rx got sent to them without a dose.   Pt also stated that the Keflex was sent in for her to take before a dentist appt but she canceled that appt so she never took it.

## 2022-06-28 NOTE — Progress Notes (Signed)
Subjective:ever started ta  Patient ID: Sandra Hendrix, female    DOB: 1946-03-04  Age: 76 y.o. MRN: [id number]  CC: The primary encounter diagnosis was Type 2 DM with CKD stage 3 and hypertension ([place]). Diagnoses of Essential hypertension, Hyperlipidemia associated with type 2 diabetes mellitus ([place]), Long-term use of high-risk medication, Encounter for screening mammogram for malignant neoplasm of breast, and Dysuria were also pertinent to this visit.   [name] presents for  Chief Complaint  Patient presents with   Follow-up    Follow up on diabetes   1) T2DM:  last seen in August.  Ozempic was stopped due to excessive weight loss leading to non adherence to low GI diet.  Jardiance started at last visit    blood sugars :  today fasting 159 which is her average fasting typically done at 11:30.  Fasts until noon on MW F,  but drinks a ultra low carb protein shake in  morning on days she works out.  TH and Sat checks at noon .  Evening sugars  are sometimes 2  after meals and are 140  TAKING METFORMIN, She  has not started taking JARDIANCE   2) vaginitis:  symptoms of dysuria for the past 6 weeks,  no frequency ,  no itching,  no discharge,  has been unable to see gynecology. Tno recent antibiotic use  saw Urology in late September,  UA was notmal except for 3+ glucose.  (No WBCs)  she has not been sexually in active in years.      Outpatient Medications Prior to Visit  Medication Sig Dispense Refill   acetaminophen (TYLENOL) 500 MG tablet Take 2 tablets (1,000 mg total) by mouth every 6 (six) hours. (Patient taking differently: Take 1,000 mg by mouth every 6 (six) hours as needed.) 65 tablet 1   albuterol (PROVENTIL HFA;VENTOLIN HFA) 108 (90 BASE) MCG/ACT inhaler Inhale 2 puffs into the lungs every 6 (six) hours as needed for wheezing or shortness of breath.      Ascorbic Acid 500 MG CHEW Chew by mouth.     atorvastatin (LIPITOR) 40 MG tablet TAKE 1/2 TABLET EVERY DAY  AT   6  PM 45 tablet 1   azelastine (ASTELIN) 0.1 % nasal spray Place 1 spray into both nostrils daily.     benzonatate (TESSALON) 100 MG capsule Take 1 capsule (100 mg total) by mouth 3 (three) times daily as needed for cough. 21 capsule 0   Biotin 5000 MCG TABS Take by mouth daily.     blood glucose meter kit and supplies Use to check blood sugar twice daily. ICD 10: E11.29 1 each 0   Cetirizine HCl 10 MG CAPS Take by mouth.     Cinnamon Bark POWD Take by mouth.     COLLAGEN PO Take by mouth daily. For improved fingernail strength     docusate sodium (COLACE) 100 MG capsule Take 1 capsule (100 mg total) by mouth 2 (two) times daily. 20 capsule 0   fluticasone (FLONASE) 50 MCG/ACT nasal spray Place 1 spray into both nostrils daily. 48 g 1   fluticasone (FLOVENT DISKUS) 50 MCG/BLIST diskus inhaler Inhale 1 puff into the lungs 2 (two) times daily.     furosemide (LASIX) 20 MG tablet Take 1 tablet (20 mg total) by mouth 2 (two) times a week. As needed for fluid retention 45 tablet 3   Lancet Devices (ACCU-CHEK SOFTCLIX) lancets Use to test blood sugars 1 -2 times daily  1 each 3   losartan-hydrochlorothiazide (HYZAAR) 50-12.5 MG tablet TAKE 1 TABLET EVERY DAY 90 tablet 3   Magnesium 250 MG TABS Take 1 tablet by mouth daily.     meclizine (ANTIVERT) 25 MG tablet Take 1 tablet (25 mg total) by mouth 3 (three) times daily as needed for dizziness. 30 tablet 3   metFORMIN (GLUCOPHAGE-XR) 500 MG 24 hr tablet TAKE 1 TABLET EVERY DAY WITH BREAKFAST 90 tablet 1   Multiple Vitamin (MULTIVITAMIN) tablet Take 1 tablet by mouth daily.     Nutritional Supplements (NUTRITIONAL SUPPLEMENT PO) Take 1 tablet by mouth. Taking "Useful Brain" supplement daily     oxybutynin (DITROPAN-XL) 10 MG 24 hr tablet Take 1 tablet (10 mg total) by mouth daily. 90 tablet 3   pantoprazole (PROTONIX) 40 MG tablet Take 1 tablet by mouth twice daily 180 tablet 0   potassium chloride SA (KLOR-CON M) 20 MEQ tablet Take 1 tablet (20 mEq  total) by mouth 2 (two) times a week. 45 tablet 1   Probiotic Product (PROBIOTIC-10) CAPS Take 1 capsule by mouth daily.      sertraline (ZOLOFT) 50 MG tablet TAKE 1 TABLET EVERY DAY 90 tablet 3   tiZANidine (ZANAFLEX) 4 MG tablet tizanidine 4 mg tablet     verapamil (VERELAN PM) 120 MG 24 hr capsule TAKE 1 CAPSULE EVERY DAY 90 capsule 1   vitamin B-12 (CYANOCOBALAMIN) 1000 MCG tablet Take 1,000 mcg by mouth daily.     Zinc 50 MG TABS Take by mouth daily.     empagliflozin (JARDIANCE) 25 MG TABS tablet Take 1 tablet (25 mg total) by mouth daily. 90 tablet 0   glucose blood (ACCU-CHEK GUIDE) test strip USE 1 STRIP TO TEST BLOOD SUGARS  TWICE DAILY 50 each 0   No facility-administered medications prior to visit.    Review of Systems;  Patient denies headache, fevers, malaise, unintentional weight loss, skin rash, eye pain, sinus congestion and sinus pain, sore throat, dysphagia,  hemoptysis , cough, dyspnea, wheezing, chest pain, palpitations, orthopnea, edema, abdominal pain, nausea, melena, diarrhea, constipation, flank pain,  hematuria, urinary  Frequency, nocturia, numbness, tingling, seizures,  Focal weakness, Loss of consciousness,  Tremor, insomnia, depression, anxiety, and suicidal ideation.      Objective:  BP 116/64 (BP Location: Left Arm, Patient Position: Sitting, Cuff Size: Normal)   Pulse 67   Temp (!) 97.4 F (36.3 C) (Oral)   Ht '5\' 1"'  (1.549 m)   Wt 134 lb 12.8 oz (61.1 kg)   SpO2 95%   BMI 25.47 kg/m   BP Readings from Last 3 Encounters:  06/28/22 116/64  06/20/22 113/68  05/19/22 116/66    Wt Readings from Last 3 Encounters:  06/28/22 134 lb 12.8 oz (61.1 kg)  06/20/22 138 lb (62.6 kg)  05/19/22 133 lb (60.3 kg)    General appearance: alert, cooperative and appears stated age Ears: normal TM's and external ear canals both ears Throat: lips, mucosa, and tongue normal; teeth and gums normal Neck: no adenopathy, no carotid bruit, supple, symmetrical, trachea  midline and thyroid not enlarged, symmetric, no tenderness/mass/nodules Back: symmetric, no curvature. ROM normal. No CVA tenderness. Lungs: clear to auscultation bilaterally Heart: regular rate and rhythm, S1, S2 normal, no murmur, click, rub or gallop Abdomen: soft, non-tender; bowel sounds normal; no masses,  no organomegaly Pulses: 2+ and symmetric Skin: Skin color, texture, turgor normal. No rashes or lesions Lymph nodes: Cervical, supraclavicular, and axillary nodes normal. GYN:  CYSTOCELE,  vagina with  atrophy,  no erythema or discharge.  Neuro:  awake and interactive with normal mood and affect. Higher cortical functions are normal. Speech is clear without word-finding difficulty or dysarthria. Extraocular movements are intact. Visual fields of both eyes are grossly intact. Sensation to light touch is grossly intact bilaterally of upper and lower extremities. Motor examination shows 4+/5 symmetric hand grip and upper extremity and 5/5 lower extremity strength. There is no pronation or drift. Gait is non-ataxic   Lab Results  Component Value Date   HGBA1C 8.0 (H) 05/19/2022   HGBA1C 6.9 (H) 02/14/2022   HGBA1C 6.4 11/15/2021    Lab Results  Component Value Date   CREATININE 0.94 05/19/2022   CREATININE 0.93 02/14/2022   CREATININE 0.99 11/15/2021    Lab Results  Component Value Date   WBC 4.8 12/08/2020   HGB 12.0 12/08/2020   HCT 36.2 12/08/2020   PLT 270 12/08/2020   GLUCOSE 142 (H) 05/19/2022   CHOL 206 (H) 06/27/2022   TRIG 106 06/27/2022   HDL 110 06/27/2022   LDLDIRECT 66.0 02/14/2022   LDLCALC 77 06/27/2022   ALT 24 05/19/2022   AST 26 05/19/2022   NA 137 05/19/2022   K 3.8 05/19/2022   CL 98 05/19/2022   CREATININE 0.94 05/19/2022   BUN 24 (H) 05/19/2022   CO2 30 05/19/2022   TSH 2.95 12/10/2020   INR 1.1 01/23/2009   HGBA1C 8.0 (H) 05/19/2022   MICROALBUR 1.0 11/15/2021     Assessment & Plan:   Problem List Items Addressed This Visit     Type 2  DM with CKD stage 3 and hypertension ([place]) - Primary    Continue metformin and start jardiance . Will hold off on adding glipizide 2.5 mg daily since jardiance was not started yet.  Marland Kitchen ozempic stopped due to excessive weight loss resulting in nonadherence to diabetic diet and subsequent rise in A1c.  Return  In late November       Relevant Orders   Comprehensive metabolic panel   Hemoglobin A1c   Essential hypertension   Relevant Orders   Comprehensive metabolic panel   Hyperlipidemia associated with type 2 diabetes mellitus ([place])   Relevant Orders   Lipid panel   LDL cholesterol, direct   Dysuria    No evidence of UTI by Sept 25 UA done by U rology. Repeating today ,  3+ Glucosuria noted:  Jardiance? Or uncontrolled diabetes.  Repeating urine today       Relevant Orders   Urinalysis, Routine w reflex microscopic   Urine Culture   Other Visit Diagnoses     Long-term use of high-risk medication       Relevant Orders   TSH   CBC with Differential/Platelet   Encounter for screening mammogram for malignant neoplasm of breast       Relevant Orders   MM 3D SCREEN BREAST BILATERAL       I spent a total of 58mnutes with this patient in a face to face visit on the date of this encounter reviewing the last office visit with me in   August.   most recent visit with Urology in September ,   patient's diet and exercise habits, home blood  sugar readings,labs and imaging studies ,   and post visit ordering of testing and therapeutics.    Follow-up: No follow-ups on file.   TCrecencio Mc MD

## 2022-06-29 LAB — URINALYSIS, ROUTINE W REFLEX MICROSCOPIC
Bilirubin Urine: NEGATIVE
Hgb urine dipstick: NEGATIVE
Ketones, ur: NEGATIVE
Leukocytes,Ua: NEGATIVE
Nitrite: NEGATIVE
RBC / HPF: NONE SEEN (ref 0–?)
Specific Gravity, Urine: <=1.005 — AB (ref 1.000–1.030)
Total Protein, Urine: NEGATIVE
Urine Glucose: >=1000 — AB
Urobilinogen, UA: 0.2 (ref 0.0–1.0)
pH: 6.5 (ref 5.0–8.0)

## 2022-06-30 LAB — URINE CULTURE
MICRO NUMBER:: 14000817
SPECIMEN QUALITY:: ADEQUATE

## 2022-06-30 NOTE — Telephone Encounter (Signed)
Pt is a [employer] member would you like for me to place the [facility] pharmacy referral for her?

## 2022-06-30 NOTE — Telephone Encounter (Signed)
Pt called stating she cannot afford farxiga and jardiance want to see if she can get some assistance

## 2022-06-30 NOTE — Telephone Encounter (Signed)
Referral has been placed. 

## 2022-07-05 IMAGING — CR DG CHEST 2V
2 series · 2 of 2 positions shown · non-contrast
Comparison: January 30, 2017

CLINICAL DATA: Chest pain.

EXAM:
CHEST - 2 VIEW

[chest pa]
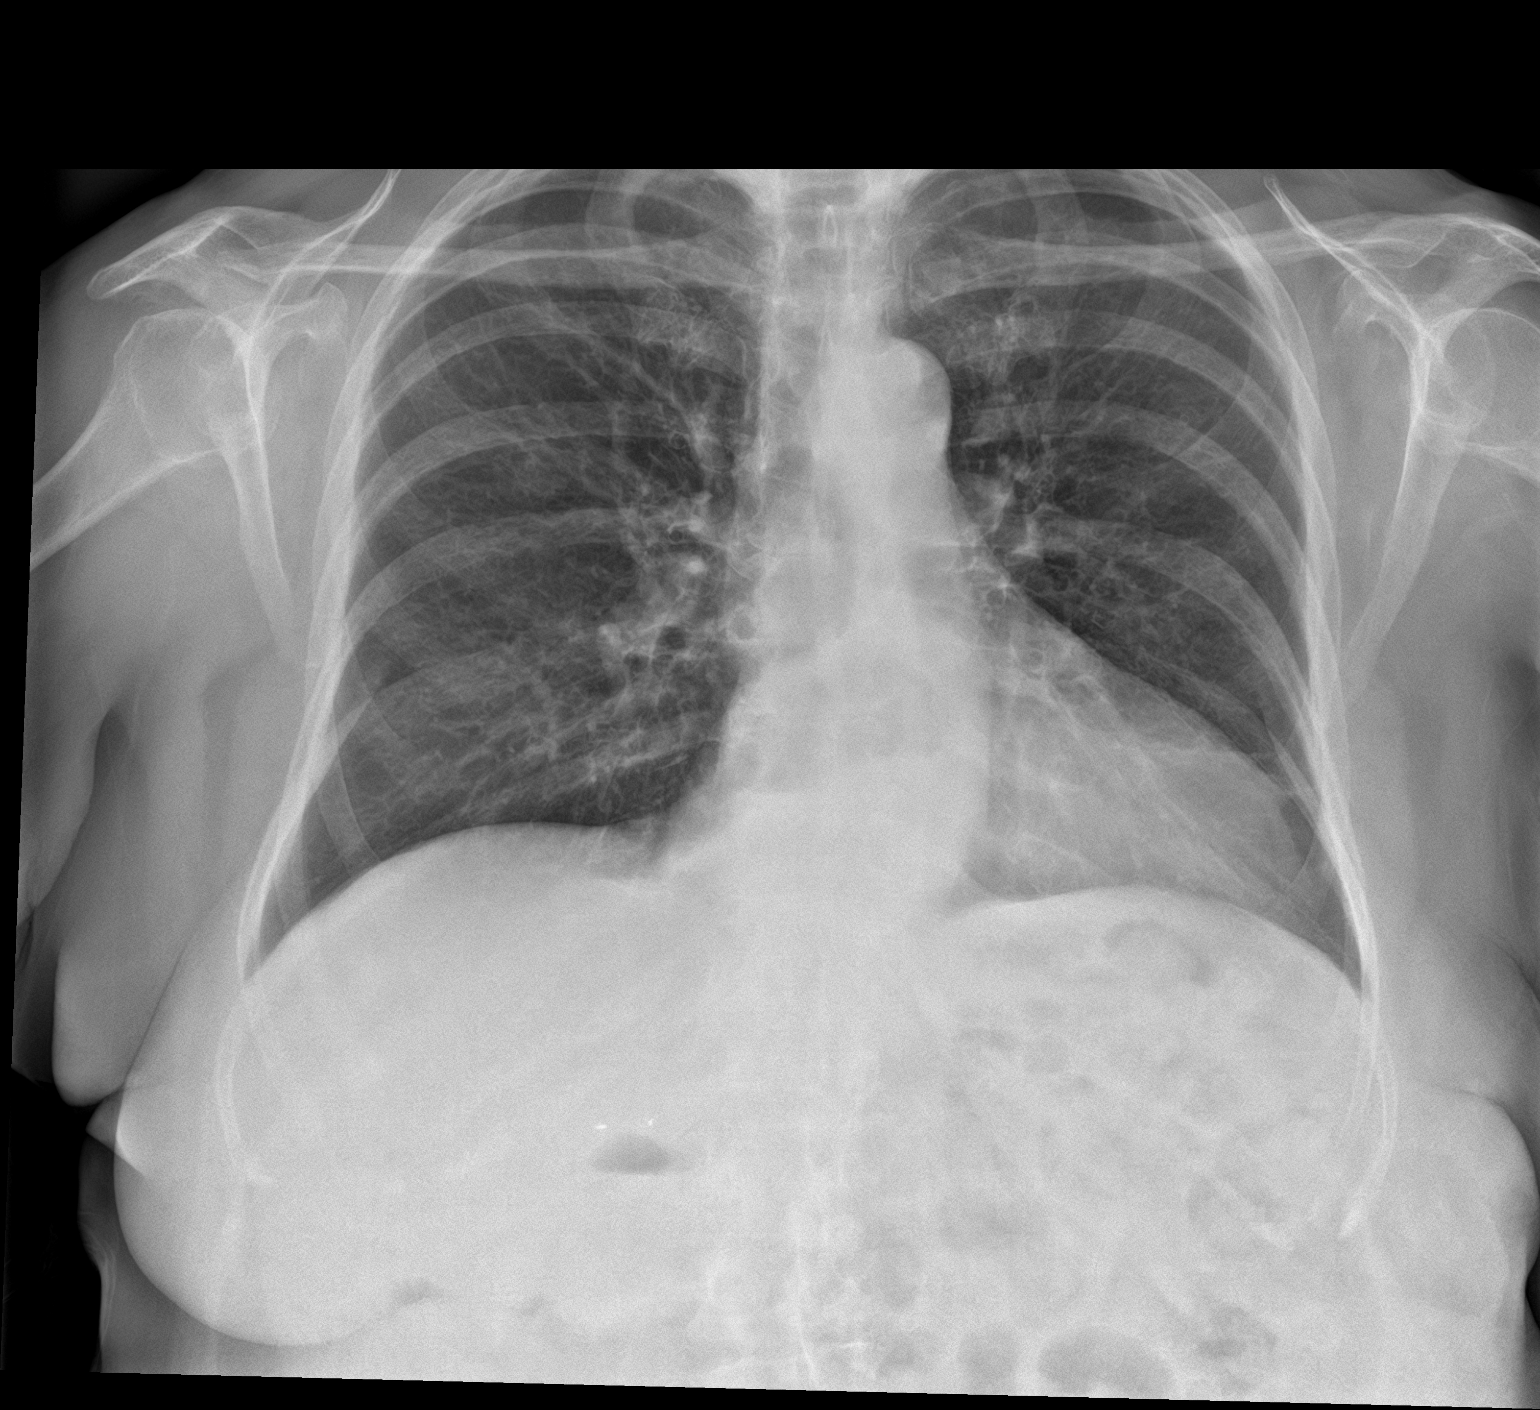

[chest lat]
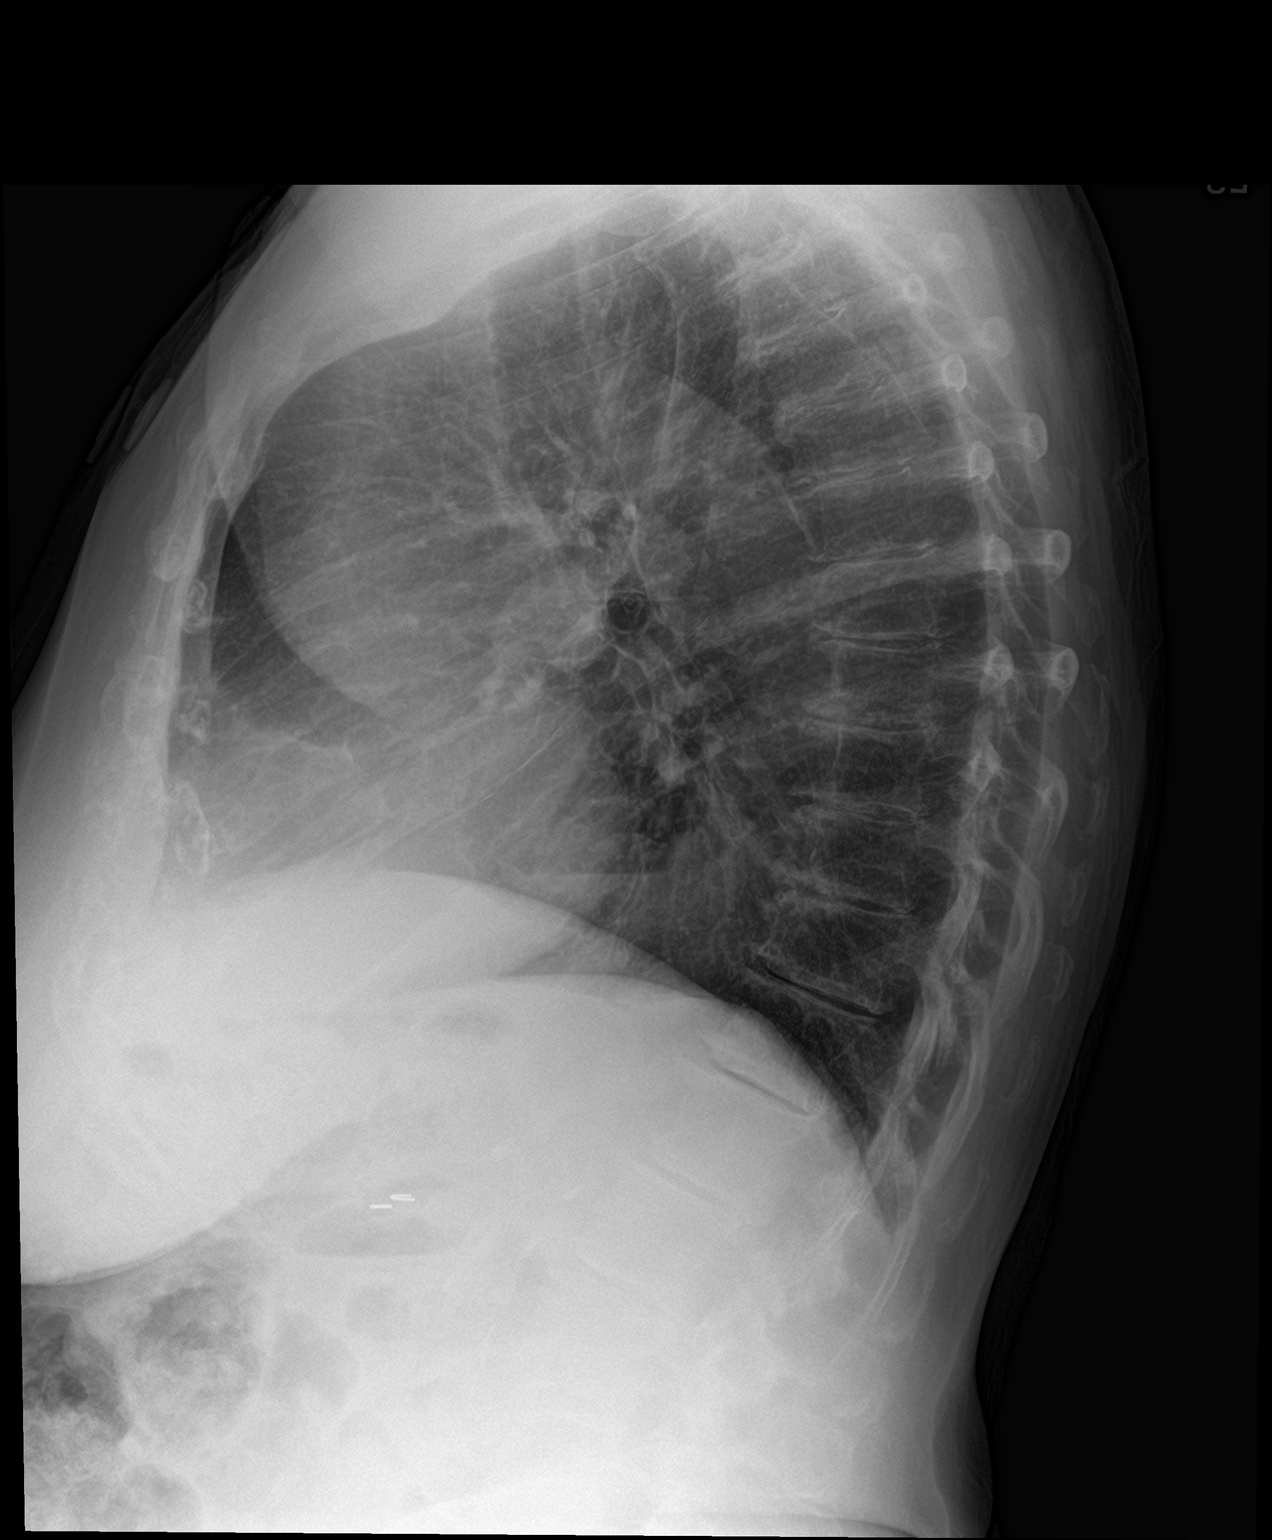

[2 of 2 positions shown; findings below may reference images not displayed]

FINDINGS: Very mild linear atelectasis is seen within the left lung base.
There is no evidence of a pleural effusion or pneumothorax. The
heart size and mediastinal contours are within normal limits. A
moderate sized hiatal hernia is seen. Radiopaque surgical clips are
seen within the right upper quadrant. The visualized skeletal
structures are unremarkable.
IMPRESSION: 1. Very mild left basilar linear atelectasis.
2. Moderate sized hiatal hernia.

## 2022-07-08 ENCOUNTER — Telehealth: Payer: Self-pay | Admitting: Pharmacist

## 2022-07-08 NOTE — Progress Notes (Signed)
[facility]) Care Management  [place]   07/08/2022  Sandra Hendrix 23-Oct-1945 [id number]  Reason for referral: medication assistance  Referral source: Provider (Dr. Derrel Nip) Referral medication(s): Jardiance Current insurance:[employer] PPO   Medication Assistance Findings:  Medication assistance needs identified: Jardiance. HIPAA identifiers were obtained. After speaking with the patient, she MAY qualify to receive Jardiance through [place] rogram.  Patient communicated understanding about the necessary financial paperwork necessary to completed her application.     Additional medication assistance options reviewed with patient as warranted:  No other options identified  Plan: I will route patient assistance letter to [place] technician who will coordinate patient assistance program application process for medications listed above.  [facility] pharmacy technician will assist with obtaining all required documents from both patient and provider(s) and submit application(s) once completed.

## 2022-07-20 ENCOUNTER — Telehealth: Payer: Self-pay | Admitting: Pharmacy Technician

## 2022-07-20 DIAGNOSIS — M79641 Pain in right hand: Secondary | ICD-10-CM | POA: Diagnosis not present

## 2022-07-20 DIAGNOSIS — Z596 Low income: Secondary | ICD-10-CM

## 2022-07-20 DIAGNOSIS — M720 Palmar fascial fibromatosis [Dupuytren]: Secondary | ICD-10-CM | POA: Diagnosis not present

## 2022-07-20 NOTE — Progress Notes (Signed)
[place] [facility])                                            [place] Team    07/20/2022  Larose Batres Kitner 1945/12/20 [id number]                                      Medication Assistance Referral  Referral From: [place] Medication/Company: Vania Rea / [employer] Patient application portion:  Mailed Provider application portion: Faxed  to Dr. Deborra Medina Provider address/fax verified via: Office website   Oswald Pott P. Miara Emminger, [place]  [phone]

## 2022-07-25 ENCOUNTER — Encounter (INDEPENDENT_AMBULATORY_CARE_PROVIDER_SITE_OTHER): Payer: Self-pay

## 2022-08-04 DIAGNOSIS — Z03818 Encounter for observation for suspected exposure to other biological agents ruled out: Secondary | ICD-10-CM | POA: Diagnosis not present

## 2022-08-04 DIAGNOSIS — J069 Acute upper respiratory infection, unspecified: Secondary | ICD-10-CM | POA: Diagnosis not present

## 2022-08-04 DIAGNOSIS — J029 Acute pharyngitis, unspecified: Secondary | ICD-10-CM | POA: Diagnosis not present

## 2022-08-04 DIAGNOSIS — H1131 Conjunctival hemorrhage, right eye: Secondary | ICD-10-CM | POA: Diagnosis not present

## 2022-08-08 ENCOUNTER — Other Ambulatory Visit: Payer: Self-pay | Admitting: Internal Medicine

## 2022-08-09 DIAGNOSIS — H1131 Conjunctival hemorrhage, right eye: Secondary | ICD-10-CM | POA: Diagnosis not present

## 2022-08-09 DIAGNOSIS — H52223 Regular astigmatism, bilateral: Secondary | ICD-10-CM | POA: Diagnosis not present

## 2022-08-09 DIAGNOSIS — E119 Type 2 diabetes mellitus without complications: Secondary | ICD-10-CM | POA: Diagnosis not present

## 2022-08-09 DIAGNOSIS — H524 Presbyopia: Secondary | ICD-10-CM | POA: Diagnosis not present

## 2022-08-09 DIAGNOSIS — H04123 Dry eye syndrome of bilateral lacrimal glands: Secondary | ICD-10-CM | POA: Diagnosis not present

## 2022-08-09 DIAGNOSIS — Z7984 Long term (current) use of oral hypoglycemic drugs: Secondary | ICD-10-CM | POA: Diagnosis not present

## 2022-08-09 DIAGNOSIS — H5203 Hypermetropia, bilateral: Secondary | ICD-10-CM | POA: Diagnosis not present

## 2022-08-09 DIAGNOSIS — Z961 Presence of intraocular lens: Secondary | ICD-10-CM | POA: Diagnosis not present

## 2022-08-16 DIAGNOSIS — M19041 Primary osteoarthritis, right hand: Secondary | ICD-10-CM | POA: Diagnosis not present

## 2022-08-25 DIAGNOSIS — Z1231 Encounter for screening mammogram for malignant neoplasm of breast: Secondary | ICD-10-CM | POA: Diagnosis not present

## 2022-08-30 ENCOUNTER — Other Ambulatory Visit (INDEPENDENT_AMBULATORY_CARE_PROVIDER_SITE_OTHER): Payer: Medicare PPO

## 2022-08-30 DIAGNOSIS — E1169 Type 2 diabetes mellitus with other specified complication: Secondary | ICD-10-CM

## 2022-08-30 DIAGNOSIS — E785 Hyperlipidemia, unspecified: Secondary | ICD-10-CM | POA: Diagnosis not present

## 2022-08-30 DIAGNOSIS — N183 Chronic kidney disease, stage 3 unspecified: Secondary | ICD-10-CM | POA: Diagnosis not present

## 2022-08-30 DIAGNOSIS — I10 Essential (primary) hypertension: Secondary | ICD-10-CM

## 2022-08-30 DIAGNOSIS — I129 Hypertensive chronic kidney disease with stage 1 through stage 4 chronic kidney disease, or unspecified chronic kidney disease: Secondary | ICD-10-CM

## 2022-08-30 DIAGNOSIS — E1122 Type 2 diabetes mellitus with diabetic chronic kidney disease: Secondary | ICD-10-CM

## 2022-08-30 DIAGNOSIS — Z79899 Other long term (current) drug therapy: Secondary | ICD-10-CM

## 2022-08-30 LAB — LIPID PANEL
Cholesterol: 316 mg/dL — ABNORMAL HIGH (ref 0–200)
HDL: 135.3 mg/dL (ref >39.00)
LDL Cholesterol: 166 mg/dL — ABNORMAL HIGH (ref 0–99)
NonHDL: 181.04
Total CHOL/HDL Ratio: 2
Triglycerides: 77 mg/dL (ref 0.0–149.0)
VLDL: 15.4 mg/dL (ref 0.0–40.0)

## 2022-08-30 LAB — CBC WITH DIFFERENTIAL/PLATELET
Basophils Absolute: 0 10*3/uL (ref 0.0–0.1)
Basophils Relative: 0.6 % (ref 0.0–3.0)
Eosinophils Absolute: 0.1 10*3/uL (ref 0.0–0.7)
Eosinophils Relative: 4.1 % (ref 0.0–5.0)
HCT: 39.5 % (ref 36.0–46.0)
Hemoglobin: 13.2 g/dL (ref 12.0–15.0)
Lymphocytes Relative: 27.3 % (ref 12.0–46.0)
Lymphs Abs: 0.8 10*3/uL (ref 0.7–4.0)
MCHC: 33.4 g/dL (ref 30.0–36.0)
MCV: 88.6 fl (ref 78.0–100.0)
Monocytes Absolute: 0.3 10*3/uL (ref 0.1–1.0)
Monocytes Relative: 9.8 % (ref 3.0–12.0)
Neutro Abs: 1.8 10*3/uL (ref 1.4–7.7)
Neutrophils Relative %: 58.2 % (ref 43.0–77.0)
Platelets: 301 10*3/uL (ref 150.0–400.0)
RBC: 4.46 Mil/uL (ref 3.87–5.11)
RDW: 15.8 % — ABNORMAL HIGH (ref 11.5–15.5)
WBC: 3 10*3/uL — ABNORMAL LOW (ref 4.0–10.5)

## 2022-08-30 LAB — COMPREHENSIVE METABOLIC PANEL
ALT: 16 U/L (ref 0–35)
AST: 19 U/L (ref 0–37)
Albumin: 4.6 g/dL (ref 3.5–5.2)
Alkaline Phosphatase: 127 U/L — ABNORMAL HIGH (ref 39–117)
BUN: 26 mg/dL — ABNORMAL HIGH (ref 6–23)
CO2: 28 mEq/L (ref 19–32)
Calcium: 9.6 mg/dL (ref 8.4–10.5)
Chloride: 100 mEq/L (ref 96–112)
Creatinine, Ser: 0.96 mg/dL (ref 0.40–1.20)
GFR: 56.71 mL/min — ABNORMAL LOW (ref >60.00)
Glucose, Bld: 168 mg/dL — ABNORMAL HIGH (ref 70–99)
Potassium: 3.5 mEq/L (ref 3.5–5.1)
Sodium: 138 mEq/L (ref 135–145)
Total Bilirubin: 0.6 mg/dL (ref 0.2–1.2)
Total Protein: 7.2 g/dL (ref 6.0–8.3)

## 2022-08-30 LAB — TSH: TSH: 3 u[IU]/mL (ref 0.35–5.50)

## 2022-08-30 LAB — HEMOGLOBIN A1C: Hgb A1c MFr Bld: 8.3 % — ABNORMAL HIGH (ref 4.6–6.5)

## 2022-08-30 LAB — LDL CHOLESTEROL, DIRECT: Direct LDL: 146 mg/dL

## 2022-08-31 ENCOUNTER — Telehealth: Payer: Self-pay | Admitting: Pharmacy Technician

## 2022-08-31 DIAGNOSIS — Z596 Low income: Secondary | ICD-10-CM

## 2022-08-31 NOTE — Progress Notes (Signed)
[facility] [facility])                                            [place] Team    08/31/2022  Lasha Echeverria Cadmus August 13, 1946 [id number]  Received both patient and provider portion(s) of patient assistance application(s) for Jardiance. Faxed completed application and required documents into BI.    Henri Baumler P. Millan Legan, [place]  [phone]

## 2022-09-02 ENCOUNTER — Telehealth: Payer: Self-pay

## 2022-09-05 ENCOUNTER — Telehealth: Payer: Self-pay | Admitting: Pharmacist

## 2022-09-05 ENCOUNTER — Telehealth: Payer: Self-pay | Admitting: Pharmacy Technician

## 2022-09-05 ENCOUNTER — Telehealth: Payer: Self-pay | Admitting: Internal Medicine

## 2022-09-05 DIAGNOSIS — Z596 Low income: Secondary | ICD-10-CM

## 2022-09-05 NOTE — Progress Notes (Addendum)
[place] [facility])                                            [place] Team    09/05/2022  Sandra Hendrix [birth year] [id number]  Patient was called regarding medication assistance. HIPAA identifiers were obtained.  Unfortunately, patient is over income to receive Jardiance through [employer] program.  This was communicated to the patient and she mentioned that she had been on Farxiga in the past. Wilder Glade is available through [facility] and [place] and the patient MAY qualify to get it through their program if the patient's provider deems it therapeutically appropriate to switch therapy.  Patient said she was going to send her provider a MyChart message as her blood sugars have been elevated.  A note will also be sent to the patient's PCP letting her know about our inability to get Jardiance and th potential of Farxiga.  Elayne Guerin, PharmD, [place] Clinical Pharmacist [phone]  Note sent to [place] 09/16/22 Elayne Guerin, PharmD, [place] Clinical Pharmacist [phone]

## 2022-09-05 NOTE — Telephone Encounter (Signed)
Patient is on metFORMIN (GLUCOPHAGE-XR) 500 MG 24 hr tablet and empagliflozin (JARDIANCE) 25 MG TABS tablet and her sugar levels are funning 145, overnight 245. When eating a meal it goes up and not down. Patient has an appoitment 09/23/2022, at the time of call no earlier appointments with Dr Derrel Nip.

## 2022-09-05 NOTE — Progress Notes (Signed)
[place] [facility])                                            [place] Team    09/05/2022  Consetta Cosner Omary 06-15-1946 [id number]  Care coordination call placed to BI in regard to [facility] application.  Spoke to [facility] who informs patient is DENIED due to being over income. Patient is over the 250%FPL which makes patient ineligible for the program.  Will send referral back to [facility] PharmD for re evaluation into another program if applicable.  Aliany Fiorenza P. Gadge Hermiz, [place]  [phone]

## 2022-09-05 NOTE — Telephone Encounter (Signed)
No available appts before her appt on 09/23/2022.

## 2022-09-07 ENCOUNTER — Telehealth: Payer: Self-pay | Admitting: Internal Medicine

## 2022-09-07 NOTE — Telephone Encounter (Signed)
LMTCB

## 2022-09-07 NOTE — Telephone Encounter (Signed)
Placed in results folder.  

## 2022-09-07 NOTE — Telephone Encounter (Signed)
Patient dropped off document for Dr to look at. Document is located in providers tray at front office.

## 2022-09-07 NOTE — Telephone Encounter (Signed)
Pt came in and I read Dr. Derrel Nip message to her, however, pt states that she rather take Farxiga, or something else Dr. Derrel Nip would recommend.

## 2022-09-08 NOTE — Telephone Encounter (Signed)
noted 

## 2022-09-08 NOTE — Telephone Encounter (Signed)
LMTCB

## 2022-09-15 ENCOUNTER — Telehealth: Payer: Self-pay | Admitting: Internal Medicine

## 2022-09-15 DIAGNOSIS — R600 Localized edema: Secondary | ICD-10-CM | POA: Diagnosis not present

## 2022-09-15 DIAGNOSIS — N182 Chronic kidney disease, stage 2 (mild): Secondary | ICD-10-CM | POA: Diagnosis not present

## 2022-09-15 DIAGNOSIS — I10 Essential (primary) hypertension: Secondary | ICD-10-CM | POA: Diagnosis not present

## 2022-09-15 DIAGNOSIS — E1122 Type 2 diabetes mellitus with diabetic chronic kidney disease: Secondary | ICD-10-CM | POA: Diagnosis not present

## 2022-09-15 NOTE — Telephone Encounter (Signed)
Pt called back and stated that after talking to her kidney doctor she would like to go back on the [place].

## 2022-09-15 NOTE — Telephone Encounter (Signed)
See previous message

## 2022-09-15 NOTE — Telephone Encounter (Signed)
Pt called stating she would like to go back on ozempic after talking to her kidney doctor about it

## 2022-09-16 MED ORDER — SEMAGLUTIDE(0.25 OR 0.5MG/DOS) 2 MG/3ML ~~LOC~~ SOPN: 0.2500 mg | PEN_INJECTOR | SUBCUTANEOUS | 2 refills | Status: DC

## 2022-09-16 NOTE — Addendum Note (Signed)
Addended by: Crecencio Mc on: 09/16/2022 05:37 PM   Modules accepted: Orders

## 2022-09-20 ENCOUNTER — Telehealth: Payer: Self-pay

## 2022-09-20 NOTE — Telephone Encounter (Signed)
-----   Message from Elayne Guerin, [facility] sent at 09/16/2022 12:08 PM EST ----- Hello there! I am one of the [facility] Pharmacist's who has been helping Sandra Hendrix afford some of her medications. We tried to get Jardiance for her through patient assistance programming and she was over income.  She MAY qualify to receive [place].  This note was sent to Dr. Derrel Nip earlier this month to inquire about the possibility of switching.  We would not need a new prescription, would simply need the [place] added to her medication list as we would send over the paperwork and if approved, the patient would receive her medication from the program.  Blessings,  Elayne Guerin, PharmD, [place] Pharmacist [phone]

## 2022-09-21 MED ORDER — DAPAGLIFLOZIN PROPANEDIOL 5 MG PO TABS: 5.0000 mg | ORAL_TABLET | Freq: Every day | ORAL | 2 refills | Status: DC

## 2022-09-21 NOTE — Addendum Note (Signed)
Addended by: Crecencio Mc on: 09/21/2022 12:10 PM   Modules accepted: Orders

## 2022-09-21 NOTE — Telephone Encounter (Signed)
Farxiga 5 mg added to med list

## 2022-09-21 NOTE — Telephone Encounter (Signed)
Unable to get Jardiance approved for patient assistance. [employer] would like to see if you would want to change her to [place]. If so they just need this medication added to her medication list.

## 2022-09-22 NOTE — Telephone Encounter (Signed)
I know this doesn't need to be sent to you but epic is not letting me send the message back to Denyse Amass, [facility]. Can you help me get this message to her.

## 2022-09-22 NOTE — Telephone Encounter (Signed)
Staff message sent to [place].

## 2022-09-28 ENCOUNTER — Ambulatory Visit: Payer: Medicare PPO | Admitting: Internal Medicine

## 2022-09-28 ENCOUNTER — Encounter: Payer: Self-pay | Admitting: Internal Medicine

## 2022-09-28 VITALS — BP 116/66 | HR 72 | Temp 97.6°F | Ht 61.0 in | Wt 133.8 lb

## 2022-09-28 DIAGNOSIS — N183 Chronic kidney disease, stage 3 unspecified: Secondary | ICD-10-CM

## 2022-09-28 DIAGNOSIS — N766 Ulceration of vulva: Secondary | ICD-10-CM

## 2022-09-28 DIAGNOSIS — K644 Residual hemorrhoidal skin tags: Secondary | ICD-10-CM | POA: Diagnosis not present

## 2022-09-28 DIAGNOSIS — E1122 Type 2 diabetes mellitus with diabetic chronic kidney disease: Secondary | ICD-10-CM

## 2022-09-28 DIAGNOSIS — I129 Hypertensive chronic kidney disease with stage 1 through stage 4 chronic kidney disease, or unspecified chronic kidney disease: Secondary | ICD-10-CM

## 2022-09-28 MED ORDER — HYDROCORT-PRAMOXINE (PERIANAL) 1-1 % EX FOAM: 1.0000 | Freq: Two times a day (BID) | CUTANEOUS | 1 refills | Status: DC

## 2022-09-28 NOTE — Patient Instructions (Addendum)
I have prescribed Proctofoam cream for the hemorrhoids. Rx sent  , but may be available OTC  Checking your blood for antibodies to herpes   Your stools are hard because you have resumed ozempic.  Continue ozempic at 0.25 mg dose.  Do not increase the dose. Continue Januvia and increase metformin to 2 tablets daily .  If stools do not improve,  add 200 mg magnesium daily as a supplement.   Continue checking Blood sugars. pre and post meal,  once daily . Pick a different meal each day if convenient.    I recommend Dimas Aguas for couples counselling . Her contact information is on the list

## 2022-09-28 NOTE — Progress Notes (Unsigned)
Subjective:  Patient ID: Sandra Hendrix, female    DOB: 21-Jun-1946  Age: 77 y.o. MRN: [id number]  CC: There were no encounter diagnoses.   [name] presents for follow up on uncontrolled type 2 DM  Chief Complaint  Patient presents with  . Medical Management of Chronic Issues    Follow up on diabetes    1) T2DM with obesity ,  hypertension, and CKD:  patient experienced recent loss of glycemic control due to dietary nonadherence after achieving significant weight loss and A1c > 7.0  in May 2023 while taking ozempic . Patient stopped ozempic in August due to development of poor eating habits to slow down the weight loss SGLT 2 inhibitor was prescribed in August but not started. She has not gained weight since stopping medication in August ; in fact she  gained,  then lost 5 lbs.  Today she  states that she has resumed ozempic,  currently on Week 2 .  Also taking [place]  and metformin 500 mg daily .  Has not started jardiance.    2) hemorrhoid (?) feels swollen but not painful  not bleeding present for several weeks.  Stools have been hard for the past week.   3)  vaginal /labial sore that opened up  several days ago.  Was initially tender until it started draining Exam: perineal location on the left.  Non thrombosed hemorrhoid  4) marital counselling requested.       Lab Results  Component Value Date   HGBA1C 8.3 (H) 08/30/2022      Outpatient Medications Prior to Visit  Medication Sig Dispense Refill  . acetaminophen (TYLENOL) 500 MG tablet Take 2 tablets (1,000 mg total) by mouth every 6 (six) hours. (Patient taking differently: Take 1,000 mg by mouth every 6 (six) hours as needed.) 65 tablet 1  . albuterol (PROVENTIL HFA;VENTOLIN HFA) 108 (90 BASE) MCG/ACT inhaler Inhale 2 puffs into the lungs every 6 (six) hours as needed for wheezing or shortness of breath.     . Ascorbic Acid 500 MG CHEW Chew by mouth.    Marland Kitchen atorvastatin (LIPITOR) 40 MG tablet TAKE  1/2 TABLET EVERY DAY  AT  6  PM 45 tablet 1  . azelastine (ASTELIN) 0.1 % nasal spray Place 1 spray into both nostrils daily.    . benzonatate (TESSALON) 100 MG capsule Take 1 capsule (100 mg total) by mouth 3 (three) times daily as needed for cough. 21 capsule 0  . Biotin 5000 MCG TABS Take by mouth daily.    . blood glucose meter kit and supplies Use to check blood sugar twice daily. ICD 10: E11.29 1 each 0  . Cetirizine HCl 10 MG CAPS Take by mouth.    Verneita Griffes Bark POWD Take by mouth.    . COLLAGEN PO Take by mouth daily. For improved fingernail strength    . docusate sodium (COLACE) 100 MG capsule Take 1 capsule (100 mg total) by mouth 2 (two) times daily. 20 capsule 0  . fluticasone (FLONASE) 50 MCG/ACT nasal spray Place 1 spray into both nostrils daily. 48 g 1  . fluticasone (FLOVENT DISKUS) 50 MCG/BLIST diskus inhaler Inhale 1 puff into the lungs 2 (two) times daily.    . furosemide (LASIX) 20 MG tablet Take 1 tablet (20 mg total) by mouth 2 (two) times a week. As needed for fluid retention 45 tablet 3  . glucose blood (ACCU-CHEK GUIDE) test strip TEST BLOOD SUGAR TWICE DAILY  200 strip 10  . Lancet Devices (ACCU-CHEK SOFTCLIX) lancets Use to test blood sugars 1 -2 times daily 1 each 3  . losartan-hydrochlorothiazide (HYZAAR) 50-12.5 MG tablet TAKE 1 TABLET EVERY DAY 90 tablet 3  . Magnesium 250 MG TABS Take 1 tablet by mouth daily.    . meclizine (ANTIVERT) 25 MG tablet Take 1 tablet (25 mg total) by mouth 3 (three) times daily as needed for dizziness. 30 tablet 3  . metFORMIN (GLUCOPHAGE-XR) 500 MG 24 hr tablet TAKE 1 TABLET EVERY DAY WITH BREAKFAST 90 tablet 1  . Multiple Vitamin (MULTIVITAMIN) tablet Take 1 tablet by mouth daily.    . Nutritional Supplements (NUTRITIONAL SUPPLEMENT PO) Take 1 tablet by mouth. Taking "Useful Brain" supplement daily    . oxybutynin (DITROPAN-XL) 10 MG 24 hr tablet Take 1 tablet (10 mg total) by mouth daily. 90 tablet 3  . pantoprazole (PROTONIX) 40 MG  tablet Take 1 tablet by mouth twice daily 180 tablet 0  . potassium chloride SA (KLOR-CON M) 20 MEQ tablet Take 1 tablet (20 mEq total) by mouth 2 (two) times a week. 45 tablet 1  . Probiotic Product (PROBIOTIC-10) CAPS Take 1 capsule by mouth daily.     . Semaglutide,0.25 or 0.5MG/DOS, 2 MG/3ML SOPN Inject 0.25 mg into the skin once a week. 3 mL 2  . sertraline (ZOLOFT) 50 MG tablet TAKE 1 TABLET EVERY DAY 90 tablet 3  . tiZANidine (ZANAFLEX) 4 MG tablet tizanidine 4 mg tablet    . verapamil (VERELAN PM) 120 MG 24 hr capsule TAKE 1 CAPSULE EVERY DAY 90 capsule 1  . vitamin B-12 (CYANOCOBALAMIN) 1000 MCG tablet Take 1,000 mcg by mouth daily.    . Zinc 50 MG TABS Take by mouth daily.    . dapagliflozin propanediol (FARXIGA) 5 MG TABS tablet Take 1 tablet (5 mg total) by mouth daily before breakfast. 30 tablet 2   No facility-administered medications prior to visit.    Review of Systems;  Patient denies headache, fevers, malaise, unintentional weight loss, skin rash, eye pain, sinus congestion and sinus pain, sore throat, dysphagia,  hemoptysis , cough, dyspnea, wheezing, chest pain, palpitations, orthopnea, edema, abdominal pain, nausea, melena, diarrhea, constipation, flank pain, dysuria, hematuria, urinary  Frequency, nocturia, numbness, tingling, seizures,  Focal weakness, Loss of consciousness,  Tremor, insomnia, depression, anxiety, and suicidal ideation.      Objective:  BP 116/66   Pulse 72   Temp 97.6 F (36.4 C) (Oral)   Ht 5' 1" (1.549 m)   Wt 133 lb 12.8 oz (60.7 kg)   SpO2 94%   BMI 25.28 kg/m   BP Readings from Last 3 Encounters:  09/28/22 116/66  06/28/22 116/64  06/20/22 113/68    Wt Readings from Last 3 Encounters:  09/28/22 133 lb 12.8 oz (60.7 kg)  06/28/22 134 lb 12.8 oz (61.1 kg)  06/20/22 138 lb (62.6 kg)    Physical Exam Vitals reviewed.  Constitutional:      General: She is not in acute distress.    Appearance: Normal appearance. She is  well-developed and normal weight. She is not ill-appearing, toxic-appearing or diaphoretic.  HENT:     Head: Normocephalic.     Right Ear: Tympanic membrane, ear canal and external ear normal. There is no impacted cerumen.     Left Ear: Tympanic membrane, ear canal and external ear normal. There is no impacted cerumen.     Nose: Nose normal.     Mouth/Throat:  Mouth: Mucous membranes are moist.     Pharynx: Oropharynx is clear.  Eyes:     General: No scleral icterus.       Right eye: No discharge.        Left eye: No discharge.     Conjunctiva/sclera: Conjunctivae normal.     Pupils: Pupils are equal, round, and reactive to light.  Neck:     Thyroid: No thyromegaly.     Vascular: No carotid bruit or JVD.  Cardiovascular:     Rate and Rhythm: Regular rhythm. Tachycardia present.     Heart sounds: Normal heart sounds.  Pulmonary:     Effort: Pulmonary effort is normal. No respiratory distress.     Breath sounds: Normal breath sounds.  Chest:  Breasts:    Breasts are symmetrical.  Abdominal:     General: Bowel sounds are normal.     Palpations: Abdomen is soft. There is no mass.     Tenderness: There is no abdominal tenderness. There is no guarding or rebound.     Hernia: There is no hernia in the left inguinal area or right inguinal area.  Genitourinary:    Exam position: Lithotomy position.     Pubic Area: No rash or pubic lice.      Labia:        Right: No rash, tenderness, lesion or injury.        Left: No rash, tenderness, lesion or injury.      Vagina: Normal.     Cervix: Normal.     Uterus: Normal.      Adnexa: Right adnexa normal and left adnexa normal.       Comments: Effaced ulcer left perineum  Nonthrombosed hemorrhoid Musculoskeletal:        General: Normal range of motion.     Cervical back: Normal range of motion and neck supple.  Lymphadenopathy:     Cervical: No cervical adenopathy.     Upper Body:     Right upper body: No supraclavicular, axillary  or pectoral adenopathy.     Left upper body: No supraclavicular, axillary or pectoral adenopathy.     Lower Body: No right inguinal adenopathy. No left inguinal adenopathy.  Skin:    General: Skin is warm and dry.  Neurological:     General: No focal deficit present.     Mental Status: She is alert and oriented to person, place, and time. Mental status is at baseline.  Psychiatric:        Mood and Affect: Mood normal.        Behavior: Behavior normal.        Thought Content: Thought content normal.        Judgment: Judgment normal.    Lab Results  Component Value Date   HGBA1C 8.3 (H) 08/30/2022   HGBA1C 8.0 (H) 05/19/2022   HGBA1C 6.9 (H) 02/14/2022    Lab Results  Component Value Date   CREATININE 0.96 08/30/2022   CREATININE 0.94 05/19/2022   CREATININE 0.93 02/14/2022    Lab Results  Component Value Date   WBC 3.0 (L) 08/30/2022   HGB 13.2 08/30/2022   HCT 39.5 08/30/2022   PLT 301.0 08/30/2022   GLUCOSE 168 (H) 08/30/2022   CHOL 316 (H) 08/30/2022   TRIG 77.0 08/30/2022   HDL 135.30 08/30/2022   LDLDIRECT 146.0 08/30/2022   LDLCALC 166 (H) 08/30/2022   ALT 16 08/30/2022   AST 19 08/30/2022   NA 138 08/30/2022   K 3.5  08/30/2022   CL 100 08/30/2022   CREATININE 0.96 08/30/2022   BUN 26 (H) 08/30/2022   CO2 28 08/30/2022   TSH 3.00 08/30/2022   INR 1.1 01/23/2009   HGBA1C 8.3 (H) 08/30/2022   MICROALBUR 1.0 11/15/2021    DG Chest 2 View  Result Date: 03/09/2022 CLINICAL DATA:  Fever.  Fatigue.  Loss of appetite. EXAM: CHEST - 2 VIEW COMPARISON:  12/08/2020 FINDINGS: Atherosclerotic calcification of the aortic arch. Small to moderate hiatal hernia. The lungs appear clear. No blunting of the costophrenic angles. Mild thoracic spondylosis. IMPRESSION: 1. No airspace opacity to suggest pneumonia. 2. Hiatal hernia. 3.  Aortic Atherosclerosis (ICD10-I70.0). Electronically Signed   By: Van Clines M.D.   On: 03/09/2022 12:13    Assessment & Plan:   .There are no diagnoses linked to this encounter.   I provided 30 minutes of face-to-face time during this encounter reviewing patient's last visit with me, patient's  most recent visit with cardiology,  nephrology,  and neurology,  recent surgical and non surgical procedures, previous  labs and imaging studies, counseling on currently addressed issues,  and post visit ordering to diagnostics and therapeutics .   Follow-up: No follow-ups on file.   Crecencio Mc, MD

## 2022-09-29 DIAGNOSIS — K644 Residual hemorrhoidal skin tags: Secondary | ICD-10-CM | POA: Insufficient documentation

## 2022-09-29 LAB — HSV(HERPES SIMPLEX VRS) I + II AB-IGG
HAV 1 IGG,TYPE SPECIFIC AB: 2.91 index — ABNORMAL HIGH
HSV 2 IGG,TYPE SPECIFIC AB: 2 index — ABNORMAL HIGH

## 2022-09-29 NOTE — Progress Notes (Incomplete)
Subjective:  Patient ID: Sandra Hendrix, female    DOB: 03/24/1946  Age: 77 y.o. MRN: [id number]  CC: The encounter diagnosis was Ulcer of genital labia.   [name] presents for follow up on uncontrolled type 2 DM  Chief Complaint  Patient presents with  . Medical Management of Chronic Issues    Follow up on diabetes    1) T2DM with obesity ,  hypertension, and CKD:  patient experienced recent loss of glycemic control due to dietary nonadherence after achieving significant weight loss and A1c > 7.0  in May 2023 while taking ozempic . Patient stopped ozempic in August due to development of poor eating habits to slow down the weight loss SGLT 2 inhibitor was prescribed in August but not started. She has not gained weight since stopping medication in August ; in fact she  gained,  then lost 5 lbs.  Today she  states that she has resumed ozempic,  currently on Week 2 .  Also taking [place]  and metformin 500 mg daily .  Has not started jardiance.    2) hemorrhoid (?) feels swollen but not painful  not bleeding present for several weeks.  Stools have been hard for the past week.   3)  vaginal /labial sore that opened up  several days ago.  Was initially tender until it started draining Exam: perineal location on the left.  Non thrombosed hemorrhoid  4) marital counselling requested.       Lab Results  Component Value Date   HGBA1C 8.3 (H) 08/30/2022      Outpatient Medications Prior to Visit  Medication Sig Dispense Refill  . acetaminophen (TYLENOL) 500 MG tablet Take 2 tablets (1,000 mg total) by mouth every 6 (six) hours. (Patient taking differently: Take 1,000 mg by mouth every 6 (six) hours as needed.) 65 tablet 1  . albuterol (PROVENTIL HFA;VENTOLIN HFA) 108 (90 BASE) MCG/ACT inhaler Inhale 2 puffs into the lungs every 6 (six) hours as needed for wheezing or shortness of breath.     . Ascorbic Acid 500 MG CHEW Chew by mouth.    Marland Kitchen atorvastatin (LIPITOR) 40  MG tablet TAKE 1/2 TABLET EVERY DAY  AT  6  PM 45 tablet 1  . azelastine (ASTELIN) 0.1 % nasal spray Place 1 spray into both nostrils daily.    . benzonatate (TESSALON) 100 MG capsule Take 1 capsule (100 mg total) by mouth 3 (three) times daily as needed for cough. 21 capsule 0  . Biotin 5000 MCG TABS Take by mouth daily.    . blood glucose meter kit and supplies Use to check blood sugar twice daily. ICD 10: E11.29 1 each 0  . Cetirizine HCl 10 MG CAPS Take by mouth.    Verneita Griffes Bark POWD Take by mouth.    . COLLAGEN PO Take by mouth daily. For improved fingernail strength    . docusate sodium (COLACE) 100 MG capsule Take 1 capsule (100 mg total) by mouth 2 (two) times daily. 20 capsule 0  . fluticasone (FLONASE) 50 MCG/ACT nasal spray Place 1 spray into both nostrils daily. 48 g 1  . fluticasone (FLOVENT DISKUS) 50 MCG/BLIST diskus inhaler Inhale 1 puff into the lungs 2 (two) times daily.    . furosemide (LASIX) 20 MG tablet Take 1 tablet (20 mg total) by mouth 2 (two) times a week. As needed for fluid retention 45 tablet 3  . glucose blood (ACCU-CHEK GUIDE) test strip TEST BLOOD  SUGAR TWICE DAILY 200 strip 10  . Lancet Devices (ACCU-CHEK SOFTCLIX) lancets Use to test blood sugars 1 -2 times daily 1 each 3  . losartan-hydrochlorothiazide (HYZAAR) 50-12.5 MG tablet TAKE 1 TABLET EVERY DAY 90 tablet 3  . Magnesium 250 MG TABS Take 1 tablet by mouth daily.    . meclizine (ANTIVERT) 25 MG tablet Take 1 tablet (25 mg total) by mouth 3 (three) times daily as needed for dizziness. 30 tablet 3  . metFORMIN (GLUCOPHAGE-XR) 500 MG 24 hr tablet TAKE 1 TABLET EVERY DAY WITH BREAKFAST 90 tablet 1  . Multiple Vitamin (MULTIVITAMIN) tablet Take 1 tablet by mouth daily.    . Nutritional Supplements (NUTRITIONAL SUPPLEMENT PO) Take 1 tablet by mouth. Taking "Useful Brain" supplement daily    . oxybutynin (DITROPAN-XL) 10 MG 24 hr tablet Take 1 tablet (10 mg total) by mouth daily. 90 tablet 3  . pantoprazole  (PROTONIX) 40 MG tablet Take 1 tablet by mouth twice daily 180 tablet 0  . potassium chloride SA (KLOR-CON M) 20 MEQ tablet Take 1 tablet (20 mEq total) by mouth 2 (two) times a week. 45 tablet 1  . Probiotic Product (PROBIOTIC-10) CAPS Take 1 capsule by mouth daily.     . Semaglutide,0.25 or 0.5MG/DOS, 2 MG/3ML SOPN Inject 0.25 mg into the skin once a week. 3 mL 2  . sertraline (ZOLOFT) 50 MG tablet TAKE 1 TABLET EVERY DAY 90 tablet 3  . tiZANidine (ZANAFLEX) 4 MG tablet tizanidine 4 mg tablet    . verapamil (VERELAN PM) 120 MG 24 hr capsule TAKE 1 CAPSULE EVERY DAY 90 capsule 1  . vitamin B-12 (CYANOCOBALAMIN) 1000 MCG tablet Take 1,000 mcg by mouth daily.    . Zinc 50 MG TABS Take by mouth daily.    . dapagliflozin propanediol (FARXIGA) 5 MG TABS tablet Take 1 tablet (5 mg total) by mouth daily before breakfast. 30 tablet 2   No facility-administered medications prior to visit.    Review of Systems;  Patient denies headache, fevers, malaise, unintentional weight loss, skin rash, eye pain, sinus congestion and sinus pain, sore throat, dysphagia,  hemoptysis , cough, dyspnea, wheezing, chest pain, palpitations, orthopnea, edema, abdominal pain, nausea, melena, diarrhea, constipation, flank pain, dysuria, hematuria, urinary  Frequency, nocturia, numbness, tingling, seizures,  Focal weakness, Loss of consciousness,  Tremor, insomnia, depression, anxiety, and suicidal ideation.      Objective:  BP 116/66   Pulse 72   Temp 97.6 F (36.4 C) (Oral)   Ht _0  (1.549 m)   Wt 133 lb 12.8 oz (60.7 kg)   SpO2 94%   BMI 25.28 kg/m   BP Readings from Last 3 Encounters:  09/28/22 116/66  06/28/22 116/64  06/20/22 113/68    Wt Readings from Last 3 Encounters:  09/28/22 133 lb 12.8 oz (60.7 kg)  06/28/22 134 lb 12.8 oz (61.1 kg)  06/20/22 138 lb (62.6 kg)    Physical Exam Vitals reviewed.  Constitutional:      General: She is not in acute distress.    Appearance: Normal appearance.  She is well-developed and normal weight. She is not ill-appearing, toxic-appearing or diaphoretic.  HENT:     Head: Normocephalic.     Right Ear: Tympanic membrane, ear canal and external ear normal. There is no impacted cerumen.     Left Ear: Tympanic membrane, ear canal and external ear normal. There is no impacted cerumen.     Nose: Nose normal.     Mouth/Throat:  Mouth: Mucous membranes are moist.     Pharynx: Oropharynx is clear.  Eyes:     General: No scleral icterus.       Right eye: No discharge.        Left eye: No discharge.     Conjunctiva/sclera: Conjunctivae normal.     Pupils: Pupils are equal, round, and reactive to light.  Neck:     Thyroid: No thyromegaly.     Vascular: No carotid bruit or JVD.  Cardiovascular:     Rate and Rhythm: Regular rhythm. Tachycardia present.     Heart sounds: Normal heart sounds.  Pulmonary:     Effort: Pulmonary effort is normal. No respiratory distress.     Breath sounds: Normal breath sounds.  Chest:  Breasts:    Breasts are symmetrical.  Abdominal:     General: Bowel sounds are normal.     Palpations: Abdomen is soft. There is no mass.     Tenderness: There is no abdominal tenderness. There is no guarding or rebound.     Hernia: There is no hernia in the left inguinal area or right inguinal area.  Genitourinary:    Exam position: Lithotomy position.     Pubic Area: No rash or pubic lice.      Labia:        Right: No rash, tenderness, lesion or injury.        Left: No rash, tenderness, lesion or injury.      Vagina: Normal.     Cervix: Normal.     Uterus: Normal.      Adnexa: Right adnexa normal and left adnexa normal.       Comments: Effaced ulcer left perineum  Nonthrombosed hemorrhoid Musculoskeletal:        General: Normal range of motion.     Cervical back: Normal range of motion and neck supple.  Lymphadenopathy:     Cervical: No cervical adenopathy.     Upper Body:     Right upper body: No supraclavicular,  axillary or pectoral adenopathy.     Left upper body: No supraclavicular, axillary or pectoral adenopathy.     Lower Body: No right inguinal adenopathy. No left inguinal adenopathy.  Skin:    General: Skin is warm and dry.  Neurological:     General: No focal deficit present.     Mental Status: She is alert and oriented to person, place, and time. Mental status is at baseline.  Psychiatric:        Mood and Affect: Mood normal.        Behavior: Behavior normal.        Thought Content: Thought content normal.        Judgment: Judgment normal.     Lab Results  Component Value Date   HGBA1C 8.3 (H) 08/30/2022   HGBA1C 8.0 (H) 05/19/2022   HGBA1C 6.9 (H) 02/14/2022    Lab Results  Component Value Date   CREATININE 0.96 08/30/2022   CREATININE 0.94 05/19/2022   CREATININE 0.93 02/14/2022    Lab Results  Component Value Date   WBC 3.0 (L) 08/30/2022   HGB 13.2 08/30/2022   HCT 39.5 08/30/2022   PLT 301.0 08/30/2022   GLUCOSE 168 (H) 08/30/2022   CHOL 316 (H) 08/30/2022   TRIG 77.0 08/30/2022   HDL 135.30 08/30/2022   LDLDIRECT 146.0 08/30/2022   LDLCALC 166 (H) 08/30/2022   ALT 16 08/30/2022   AST 19 08/30/2022   NA 138 08/30/2022   K  3.5 08/30/2022   CL 100 08/30/2022   CREATININE 0.96 08/30/2022   BUN 26 (H) 08/30/2022   CO2 28 08/30/2022   TSH 3.00 08/30/2022   INR 1.1 01/23/2009   HGBA1C 8.3 (H) 08/30/2022   MICROALBUR 1.0 11/15/2021    DG Chest 2 View  Result Date: 03/09/2022 CLINICAL DATA:  Fever.  Fatigue.  Loss of appetite. EXAM: CHEST - 2 VIEW COMPARISON:  12/08/2020 FINDINGS: Atherosclerotic calcification of the aortic arch. Small to moderate hiatal hernia. The lungs appear clear. No blunting of the costophrenic angles. Mild thoracic spondylosis. IMPRESSION: 1. No airspace opacity to suggest pneumonia. 2. Hiatal hernia. 3.  Aortic Atherosclerosis (ICD10-I70.0). Electronically Signed   By: Van Clines M.D.   On: 03/09/2022 12:13    Assessment &  Plan:  .Ulcer of genital labia -     HSV(herpes simplex vrs) 1+2 ab-IgG  Other orders -     Hydrocort-Pramoxine (Perianal); Place 1 applicator rectally 2 (two) times daily. To hemorrhoid  Dispense: 10 g; Refill: 1     I provided 30 minutes of face-to-face time during this encounter reviewing patient's last visit with me, patient's  most recent visit with cardiology,  nephrology,  and neurology,  recent surgical and non surgical procedures, previous  labs and imaging studies, counseling on currently addressed issues,  and post visit ordering to diagnostics and therapeutics .   Follow-up: No follow-ups on file.   Crecencio Mc, MD

## 2022-09-29 NOTE — Assessment & Plan Note (Signed)
Proctofoam prescribed

## 2022-09-29 NOTE — Assessment & Plan Note (Signed)
Checking serologies for herpes simplex

## 2022-09-29 NOTE — Addendum Note (Signed)
Addended by: Crecencio Mc on: 09/29/2022 08:06 PM   Modules accepted: Orders

## 2022-09-29 NOTE — Assessment & Plan Note (Signed)
Pre and post prandial BS reviewed for the last 2 weeks. Continue ozempic at lowest dose.  Increase metformin to 1000 mg daily and continue [place]

## 2022-09-30 ENCOUNTER — Telehealth: Payer: Self-pay | Admitting: Pharmacy Technician

## 2022-09-30 DIAGNOSIS — Z5986 Financial insecurity: Secondary | ICD-10-CM

## 2022-09-30 DIAGNOSIS — Z596 Low income: Secondary | ICD-10-CM

## 2022-09-30 NOTE — Progress Notes (Addendum)
[facility])                                            [place] Team   11/09/2022 Medication was discontinued 09/28/22. Austine Kelsay P. Ioanna Colquhoun, [place]  [phone]  [phone] Remailed application as below due to patient informing she did not receive original mailing. Hadley Soileau P. Freddrick March [employer]  [phone]    09/30/2022  Tomica Mondo Gose 13-Nov-1945 [id number]                                      Medication Assistance Referral  Referral From: [place]  Medication/Company: [employer] / [place] Patient application portion:  Mailed Provider application portion: Faxed  to Dr. Derrel Nip Provider address/fax verified via: Office website   Laqueta Bonaventura P. Deah Ottaway, [place]  [phone]

## 2022-10-04 ENCOUNTER — Other Ambulatory Visit: Payer: Self-pay | Admitting: Internal Medicine

## 2022-10-04 ENCOUNTER — Telehealth: Payer: Self-pay | Admitting: Internal Medicine

## 2022-10-04 MED ORDER — FUROSEMIDE 20 MG PO TABS: 20.0000 mg | ORAL_TABLET | ORAL | 3 refills | Status: DC

## 2022-10-04 NOTE — Telephone Encounter (Signed)
Pt need refill on jardiance and furosemide sent to [facility]

## 2022-10-04 NOTE — Telephone Encounter (Signed)
Sandra Hendrix is not in pt's current medication list.

## 2022-10-05 ENCOUNTER — Inpatient Hospital Stay: Payer: Medicare PPO | Attending: Obstetrics and Gynecology | Admitting: Obstetrics and Gynecology

## 2022-10-05 VITALS — BP 123/71 | HR 66 | Temp 96.3°F | Resp 20 | Wt 136.4 lb

## 2022-10-05 DIAGNOSIS — C55 Malignant neoplasm of uterus, part unspecified: Secondary | ICD-10-CM

## 2022-10-05 DIAGNOSIS — C541 Malignant neoplasm of endometrium: Secondary | ICD-10-CM | POA: Diagnosis not present

## 2022-10-05 DIAGNOSIS — Z90722 Acquired absence of ovaries, bilateral: Secondary | ICD-10-CM | POA: Insufficient documentation

## 2022-10-05 DIAGNOSIS — N189 Chronic kidney disease, unspecified: Secondary | ICD-10-CM | POA: Diagnosis not present

## 2022-10-05 DIAGNOSIS — Z79811 Long term (current) use of aromatase inhibitors: Secondary | ICD-10-CM

## 2022-10-05 DIAGNOSIS — N393 Stress incontinence (female) (male): Secondary | ICD-10-CM | POA: Diagnosis not present

## 2022-10-05 DIAGNOSIS — R350 Frequency of micturition: Secondary | ICD-10-CM | POA: Diagnosis not present

## 2022-10-05 DIAGNOSIS — Z90710 Acquired absence of both cervix and uterus: Secondary | ICD-10-CM | POA: Diagnosis not present

## 2022-10-05 DIAGNOSIS — I129 Hypertensive chronic kidney disease with stage 1 through stage 4 chronic kidney disease, or unspecified chronic kidney disease: Secondary | ICD-10-CM | POA: Insufficient documentation

## 2022-10-05 DIAGNOSIS — Z923 Personal history of irradiation: Secondary | ICD-10-CM | POA: Diagnosis not present

## 2022-10-05 DIAGNOSIS — G4733 Obstructive sleep apnea (adult) (pediatric): Secondary | ICD-10-CM | POA: Insufficient documentation

## 2022-10-05 DIAGNOSIS — Z8542 Personal history of malignant neoplasm of other parts of uterus: Secondary | ICD-10-CM | POA: Insufficient documentation

## 2022-10-05 MED ORDER — SITAGLIPTIN PHOSPHATE 50 MG PO TABS: 50.0000 mg | ORAL_TABLET | Freq: Every day | ORAL | 1 refills | Status: DC

## 2022-10-05 NOTE — Addendum Note (Signed)
Addended by: Crecencio Mc on: 10/05/2022 03:25 PM   Modules accepted: Orders

## 2022-10-05 NOTE — Telephone Encounter (Signed)
Spoke with pt and she stated that she was not sure and that she would have to call back because she is unable to get into her medications right now.

## 2022-10-05 NOTE — Telephone Encounter (Signed)
LMTCB

## 2022-10-05 NOTE — Telephone Encounter (Signed)
Spoke with pt and she stated that she is taking the Januvia.

## 2022-10-05 NOTE — Patient Instructions (Signed)
Please follow up with Dr. Marcelline Mates in one year. She is now located at [facility]

## 2022-10-05 NOTE — Progress Notes (Signed)
Gynecologic Oncology Interval Visit   Referring Provider: Brayton Mars, MD/Dr. Marcelline Mates  Chief Concern: Stage IB WELL-DIFFERENTIATED ENDOMETRIOID ADENOCARCINOMA of the endometrium with positive cytology  Subjective:  Sandra Hendrix is a 77 y.o. female who is seen in consultation from Dr. Enzo Bi for stage IB grade 1 endometrial cancer with positive cytology , s/p TLH, BSO, SLN mapping and biopsies on 03/01/17, XRT, who returns to clinic today for routine follow up and pelvic exam.   She was Dr Derrel Nip last week for vulvar ulcer on left and HSV smear was positive.  SHSV 2 IGG,TYPE SPECIFIC AB ELEVATED.  She was prescribed ointment, but did not fill the Rx.  All better now.  Not sexually active.  No knowledge of prior events.   She completed 2 years of letrozole and discontinued treatment 09/11/2019.    She had her mammogram 11/23 at [facility] which was negative. Last colonoscopy and endoscopy were March 2022.   She is exercising at a gym 3 days a week. No bleeding, discharge, pain, or changes in bowel movements.    Gynecologic Oncology History She presented with postmenopausal bleeding. Her evaluation included Pap on 10/17/2016 with endometrial cells present otherwise NILM; and on 11/16/2016 endometrial biopsy that revealed atypical glandular epithelium.   Her evaluation also included pelvic US 10/17/2016 that showed uterine mass.  She underwent hysteroscopy/D&C on 01/02/2017 that revealed grade 1 endometrioid endometrial cancer.  Definitive cancer surgery was scheduled 5/9, but was delayed a month due to death of her bother on 03-01-2023.     Underwent TLH, BSO, SLN mapping and biopsies on 03/01/17  DIAGNOSIS:  A. SENTINEL LYMPH NODE 1, LEFT EXTERNAL ILIAC; EXCISION:  - NO TUMOR SEEN IN ONE LYMPH NODE (0/1).   B. SENTINEL LYMPH NODE 2, LEFT EXTERNAL ILIAC; EXCISION:  - NO TUMOR SEEN IN ONE LYMPH NODE (0/1).   C. SENTINEL LYMPH NODE 3, LEFT EXTERNAL ILIAC; EXCISION:  - NO TUMOR SEEN IN ONE  LYMPH NODE (0/1).   D. SENTINEL LYMPH NODE, RIGHT EXTERNAL ILIAC; EXCISION:  - NO TUMOR SEEN IN ONE LYMPH NODE (0/1).   E. UTERUS WITH CERVIX, BILATERAL FALLOPIAN TUBES AND OVARIES;  LAPAROSCOPIC TOTAL HYSTERECTOMY AND BILATERAL SALPINGO-OOPHORECTOMY:  - ENDOMETRIOID CARCINOMA WITH SQUAMOUS DIFFERENTIATION, FIGO GRADE I.  - INVASION EXTENDS INTO THE OUTER HALF OF THE MYOMETRIUM.  - AGE-RELATED CHANGE, BILATERAL OVARIES.  - PARATUBAL CYSTS, BILATERAL FALLOPIAN TUBES.  - SEE SUMMARY BELOW.   Surgical Pathology Cancer Case Summary  ENDOMETRIUM:  Procedure:  Laparoscopic total hysterectomy, bilateral  salpingo-oophorectomy and sentinel lymph node excision  Histologic Type: Endometrioid carcinoma with squamous differentiation  Histologic Grade: FIGO grade 1  Myometrial Invasion: Present; Depth of invasion: 26.75 mm; Myometrial  thickness (millimeters): 27 mm; Percentage of myometrial invasion: 99%  Uterine Serosa Involvement: Not identified  Cervical Stromal Involvement: Not identified  Other Tissue/Organ Involvement: Not identified  Peritoneal/ Ascitic Fluid: Positive for malignancy  Margins: Not applicable  Lymphovascular Invasion: Not identified  Regional Lymph Nodes:  # of pelvic nodes with macrometastasis: 0  # of pelvic nodes with micrometastasis: 0  Total # of pelvic nodes examined: 4   # of pelvic sentinel nodes examined: 4   Laterality of pelvic nodes examined: left and right  Pathologic Stage Classification (pTNM, AJCC 8th Edition): pT1b pN0 (sn)  / FIGO IB   She received adjuvant whole pelvic radiation w/ Dr. Baruch Gouty over 5 weeks completing 05/23/17. She did have a post-radiation UTI which was treated and resolved with antibiotics.  She initiated on Letrozole on 05/15/17 for positive washings.   11/16/17 Transvaginal u/s with Dr. Enzo Bi on was negative for cysts or masses.   12/20/2017 in [place] clinic and had a negative exam. DEXA scan 02/09/2015 normal bone  density.  10/23/2018 Dexa scan performed reported as normal with t-score of 0.3. Plan to repeat in 2022.  She alternates visits between Dr. Marcelline Mates and gyn-onc.   She completed 2.5 years of letrozole 08/2019.   Problem List: Patient Active Problem List   Diagnosis Date Noted   Inflamed external hemorrhoid 09/29/2022   Ulcer of genital labia 09/28/2022   Dysuria 06/28/2022   Aortic atherosclerosis ([place]) 03/09/2022   Thoracic arthritis 03/09/2022   Chronic rhinitis 02/07/2022   Cough variant asthma 02/07/2022   Mild intermittent asthma 02/07/2022   Vasomotor rhinitis 02/07/2022   Chronic cough 02/07/2022   Serrated polyp of colon 12/20/2021   Atypical chest pain 12/15/2020   Mucopurulent chronic bronchitis ([place]) 12/14/2020   Forgetfulness 10/23/2020   Chronic pain of right ankle 09/08/2020   Renal artery stenosis ([place]) 07/01/2020   Left wrist sprain, sequela 02/24/2020   Right hand pain 02/24/2020   Hepatic steatosis 07/18/2019   Precordial pain 07/03/2019   Epigastric pain 07/03/2019   Pain in right knee 10/31/2018   Chronic hip pain, left 09/29/2018   Hyperlipidemia associated with type 2 diabetes mellitus ([place]) 03/29/2018   Bilateral pes planus 03/29/2018   SUI (stress urinary incontinence, female) 11/23/2017   Vaginal atrophy 11/23/2017   Overweight (BMI 25.0-29.9) 06/20/2017   Carpal tunnel syndrome on right 06/20/2017   Endometrioid adenocarcinoma of uterus ([place]) 01/19/2017   OSA on CPAP 11/24/2016   Cervical stenosis (uterine cervix) 11/16/2016   CKD (chronic kidney disease) stage 3, GFR 30-59 ml/min (HCC) 11/07/2014   GERD (gastroesophageal reflux disease) 12/26/2013   Irritable bowel syndrome with constipation 12/26/2013   Encounter for preventive health examination 10/21/2013   Hyperlipidemia LDL goal <100 07/15/2013   Chronic venous insufficiency 03/21/2013   Type 2 DM with CKD stage 3 and hypertension ([place]) 11/05/2012   Lack of libido 11/05/2012   Barrett's  esophagus 11/05/2012   Hiatal hernia 11/05/2012   Essential hypertension 11/05/2012    Past Medical History: Past Medical History:  Diagnosis Date   Allergy    Anxiety    Arthritis    Barrett's esophagus determined by endoscopy    Cancer ([place])    Uterine Cancer   Chronic kidney disease    Chronic venous insufficiency    Cough variant asthma 02/07/2022   COVID-19 virus infection 07/21/2021   NOTIFIED ON OCT 26 . FEVERS BODY ACHES,  HEADACHES.  SENDING IN [place]   Depression    Diabetes mellitus without complication ([place])    Difficult intubation    "not sure what happened,asleep was told had problem with intubation"   Diverticulosis    Fatty liver    GERD (gastroesophageal reflux disease)    History of bronchitis    History of hiatal hernia    Hyperlipidemia    Hypertension    Irritable bowel syndrome with constipation    Sleep apnea    Use C- PAP   Urinary incontinence    Varicose veins     Past Surgical History: Past Surgical History:  Procedure Laterality Date   CARDIAC CATHETERIZATION     CHOLECYSTECTOMY     COLONOSCOPY WITH PROPOFOL N/A 06/14/2016   Procedure: COLONOSCOPY WITH PROPOFOL;  Surgeon: Lollie Sails, MD;  Location: [facility] ENDOSCOPY;  Service:  Endoscopy;  Laterality: N/A;   COLONOSCOPY WITH PROPOFOL N/A 12/18/2020   Procedure: COLONOSCOPY WITH PROPOFOL;  Surgeon: Lesly Rubenstein, MD;  Location: [facility] ENDOSCOPY;  Service: Endoscopy;  Laterality: N/A;   COLONOSCOPY WITH PROPOFOL N/A 12/17/2021   Procedure: COLONOSCOPY WITH PROPOFOL;  Surgeon: Annamaria Helling, DO;  Location: [facility] ENDOSCOPY;  Service: Gastroenterology;  Laterality: N/A;  DM   ESOPHAGOGASTRODUODENOSCOPY N/A 12/18/2020   Procedure: ESOPHAGOGASTRODUODENOSCOPY (EGD);  Surgeon: Lesly Rubenstein, MD;  Location: [facility] ENDOSCOPY;  Service: Endoscopy;  Laterality: N/A;   ESOPHAGOGASTRODUODENOSCOPY (EGD) WITH PROPOFOL N/A 06/14/2016   Procedure: ESOPHAGOGASTRODUODENOSCOPY (EGD) WITH  PROPOFOL;  Surgeon: Lollie Sails, MD;  Location: [facility] ENDOSCOPY;  Service: Endoscopy;  Laterality: N/A;   ESOPHAGOGASTRODUODENOSCOPY (EGD) WITH PROPOFOL N/A 06/12/2017   Procedure: ESOPHAGOGASTRODUODENOSCOPY (EGD) WITH PROPOFOL;  Surgeon: Lollie Sails, MD;  Location: [facility] ENDOSCOPY;  Service: Endoscopy;  Laterality: N/A;   ESOPHAGOGASTRODUODENOSCOPY (EGD) WITH PROPOFOL N/A 08/25/2017   Procedure: ESOPHAGOGASTRODUODENOSCOPY (EGD) WITH PROPOFOL;  Surgeon: Lollie Sails, MD;  Location: [facility] ENDOSCOPY;  Service: Endoscopy;  Laterality: N/A;   HYSTEROSCOPY WITH D & C N/A 01/02/2017   Procedure: DILATATION AND CURETTAGE /HYSTEROSCOPY;  Surgeon: Brayton Mars, MD;  Location: [facility] ORS;  Service: Gynecology;  Laterality: N/A;   JOINT REPLACEMENT Right 2007   Total Knee Replacement   JOINT REPLACEMENT Left 2004   Total Knee Replacement   LAPAROSCOPIC HYSTERECTOMY Bilateral 03/01/2017   Procedure: HYSTERECTOMY TOTAL LAPAROSCOPIC BSO;  Surgeon: Mellody Drown, MD;  Location: [facility] ORS;  Service: Gynecology;  Laterality: Bilateral;   ROTATOR CUFF REPAIR Right    SENTINEL NODE BIOPSY N/A 03/01/2017   Procedure: SENTINEL NODE INJECTION AND BIOPSY;  Surgeon: Mellody Drown, MD;  Location: [facility] ORS;  Service: Gynecology;  Laterality: N/A;   TONSILLECTOMY     as a child   TUBAL LIGATION     UPPER GI ENDOSCOPY     x2   Past Gynecologic History:  Patient is postmenopausal. Menarche: 8 History of Abnormal pap: yes,  endometrial cells w/o NILM Last pap:  2018 as per interval history   Last Pap:10/17/2016 negative for intraepithelial lesions or malignancy. Benign reparative/reactive changes. ENDOMETRIAL CELLS PRESENT Sexually active: no;  She did not have mammogram in 2018. 2019 Mammogram has been ordered.    OB History:  OB History  Gravida Para Term Preterm AB Living  '2 2 2     2  '$ SAB IAB Ectopic Multiple Live Births          2    # Outcome Date GA Lbr Len/2nd Weight Sex  Delivery Anes PTL Lv  2 Term 1972   9 lb (4.082 kg) F Vag-Spont   LIV  1 Term 1970   9 lb (4.082 kg) F Vag-Spont   LIV    Family History: Family History  Problem Relation Age of Onset   Stroke Mother    Hypertension Mother    Lupus Mother    Alcohol abuse Father    Diabetes Father    Cancer Father        gallbladder ca into liver    Arthritis Sister    Diabetes Sister    Lumbar disc disease Sister    Hyperlipidemia Daughter    Hypertension Daughter    Cancer Maternal Aunt        ovarian ca   Diabetes Paternal Aunt    Drug abuse Paternal Aunt    Cancer Maternal Grandfather        colon ca  Hypertension Brother    Alcohol abuse Brother    Stroke Brother     Social History: Social History   Socioeconomic History   Marital status: Married    Spouse name: Not on file   Number of children: 2   Years of education: 14   Highest education level: Some college, no degree  Occupational History   Not on file  Tobacco Use   Smoking status: Never   Smokeless tobacco: Never  Vaping Use   Vaping Use: Never used  Substance and Sexual Activity   Alcohol use: Not Currently    Comment: rare   Drug use: No   Sexual activity: Yes    Comment: not often  Other Topics Concern   Not on file  Social History Narrative   Not on file   Social Determinants of Health   Financial Resource Strain: Low Risk  (12/28/2021)   Overall Financial Resource Strain (CARDIA)    Difficulty of Paying Living Expenses: Not hard at all  Food Insecurity: No Food Insecurity (12/28/2021)   Hunger Vital Sign    Worried About Running Out of Food in the Last Year: Never true    [place] in the Last Year: Never true  Transportation Needs: No Transportation Needs (12/28/2021)   PRAPARE - Hydrologist (Medical): No    Lack of Transportation (Non-Medical): No  Physical Activity: Sufficiently Active (12/28/2021)   Exercise Vital Sign    Days of Exercise per Week: 3 days     Minutes of Exercise per Session: 60 min  Stress: No Stress Concern Present (12/28/2021)   [place]    Feeling of Stress : Not at all  Social Connections: [place] (12/28/2021)   Social Connection and Isolation Panel [NHANES]    Frequency of Communication with Friends and Family: More than three times a week    Frequency of Social Gatherings with Friends and Family: More than three times a week    Attends Religious Services: More than 4 times per year    Active Member of [employer] or Organizations: Yes    Attends Archivist Meetings: More than 4 times per year    Marital Status: Married  Human resources officer Violence: Not At Risk (12/28/2021)   Humiliation, Afraid, Rape, and Kick questionnaire    Fear of Current or Ex-Partner: No    Emotionally Abused: No    Physically Abused: No    Sexually Abused: No    Allergies: Allergies  Allergen Reactions   Clindamycin/Lincomycin Rash   Codeine Rash   Lincomycin Rash   Morphine And Related Nausea Only   Talwin [Pentazocine] Nausea And Vomiting   Clindamycin Other (See Comments)   Morphine Other (See Comments)   Oxycodone Other (See Comments)   Lisinopril Cough    cough   Naloxone Other (See Comments)    Had issues with sedation and vomiting with EGD   Oxycodone-Acetaminophen Other (See Comments)    Pt states this medication makes her bowels stop moving   Warfarin And Related Nausea Only    Current Medications: Current Outpatient Medications  Medication Sig Dispense Refill   acetaminophen (TYLENOL) 500 MG tablet Take 2 tablets (1,000 mg total) by mouth every 6 (six) hours. (Patient taking differently: Take 1,000 mg by mouth every 6 (six) hours as needed.) 65 tablet 1   albuterol (PROVENTIL HFA;VENTOLIN HFA) 108 (90 BASE) MCG/ACT inhaler Inhale 2 puffs into the  lungs every 6 (six) hours as needed for wheezing or shortness of breath.      Ascorbic Acid 500 MG  CHEW Chew by mouth.     atorvastatin (LIPITOR) 40 MG tablet TAKE 1/2 TABLET EVERY DAY  AT  6  PM 45 tablet 1   azelastine (ASTELIN) 0.1 % nasal spray Place 1 spray into both nostrils daily.     benzonatate (TESSALON) 100 MG capsule Take 1 capsule (100 mg total) by mouth 3 (three) times daily as needed for cough. 21 capsule 0   Biotin 5000 MCG TABS Take by mouth daily.     blood glucose meter kit and supplies Use to check blood sugar twice daily. ICD 10: E11.29 1 each 0   Cetirizine HCl 10 MG CAPS Take by mouth.     Cinnamon Bark POWD Take by mouth.     COLLAGEN PO Take by mouth daily. For improved fingernail strength     docusate sodium (COLACE) 100 MG capsule Take 1 capsule (100 mg total) by mouth 2 (two) times daily. 20 capsule 0   fluticasone (FLONASE) 50 MCG/ACT nasal spray Place 1 spray into both nostrils daily. 48 g 1   fluticasone (FLOVENT DISKUS) 50 MCG/BLIST diskus inhaler Inhale 1 puff into the lungs 2 (two) times daily.     [START ON 10/06/2022] furosemide (LASIX) 20 MG tablet Take 1 tablet (20 mg total) by mouth 2 (two) times a week. As needed for fluid retention 45 tablet 3   glucose blood (ACCU-CHEK GUIDE) test strip TEST BLOOD SUGAR TWICE DAILY 200 strip 10   hydrocortisone-pramoxine (PROCTOFOAM-HC) rectal foam Place 1 applicator rectally 2 (two) times daily. To hemorrhoid 10 g 1   Lancet Devices (ACCU-CHEK SOFTCLIX) lancets Use to test blood sugars 1 -2 times daily 1 each 3   losartan-hydrochlorothiazide (HYZAAR) 50-12.5 MG tablet TAKE 1 TABLET EVERY DAY 90 tablet 3   Magnesium 250 MG TABS Take 1 tablet by mouth daily.     meclizine (ANTIVERT) 25 MG tablet Take 1 tablet (25 mg total) by mouth 3 (three) times daily as needed for dizziness. 30 tablet 3   metFORMIN (GLUCOPHAGE-XR) 500 MG 24 hr tablet TAKE 1 TABLET EVERY DAY WITH BREAKFAST 90 tablet 1   Multiple Vitamin (MULTIVITAMIN) tablet Take 1 tablet by mouth daily.     Nutritional Supplements (NUTRITIONAL SUPPLEMENT PO) Take 1  tablet by mouth. Taking "Useful Brain" supplement daily     oxybutynin (DITROPAN-XL) 10 MG 24 hr tablet Take 1 tablet (10 mg total) by mouth daily. 90 tablet 3   pantoprazole (PROTONIX) 40 MG tablet Take 1 tablet by mouth twice daily 180 tablet 0   potassium chloride SA (KLOR-CON M) 20 MEQ tablet Take 1 tablet (20 mEq total) by mouth 2 (two) times a week. 45 tablet 1   Probiotic Product (PROBIOTIC-10) CAPS Take 1 capsule by mouth daily.      Semaglutide,0.25 or 0.'5MG'$ /DOS, 2 MG/3ML SOPN Inject 0.25 mg into the skin once a week. 3 mL 2   sertraline (ZOLOFT) 50 MG tablet TAKE 1 TABLET EVERY DAY 90 tablet 3   tiZANidine (ZANAFLEX) 4 MG tablet tizanidine 4 mg tablet     verapamil (VERELAN PM) 120 MG 24 hr capsule TAKE 1 CAPSULE EVERY DAY 90 capsule 1   vitamin B-12 (CYANOCOBALAMIN) 1000 MCG tablet Take 1,000 mcg by mouth daily.     Zinc 50 MG TABS Take by mouth daily.     No current facility-administered medications for this visit.  Review of Systems General:  no complaints Skin: no complaints Eyes: no complaints HEENT: no complaints Breasts: no complaints Pulmonary: no complaints Cardiac: no complaints Gastrointestinal: no complaints Genitourinary/Sexual: no complaints Musculoskeletal: no complaints Hematology: no complaints Neurologic/Psych: no complaints   Objective:  Physical Examination:  BP 123/71   Pulse 66   Temp (!) 96.3 F (35.7 C)   Resp 20   Wt 136 lb 6.4 oz (61.9 kg)   SpO2 100%   BMI 25.77 kg/m   ECOG Performance Status: 0 - Asymptomatic  GENERAL: Patient is a well appearing female in no acute distress HEENT:  PERRL, EOM intact LUNGS:  Clear to auscultation bilaterally.  No wheezes or rhonchi. HEART:  Regular rate and rhythm. No murmur appreciated. ABDOMEN:  Soft, nontender.  Positive, normoactive bowel sounds. Well healed surgical scars MSK:  ambulatory w/o aids EXTREMITIES:  No peripheral edema.   SKIN:  Clear with no obvious rashes or skin changes.   NEURO:  Nonfocal. Well oriented.  Appropriate affect.  Pelvic: exam chaperoned by CMA;  Vulva: normal appearing vulva w/o masses, tenderness or lesions. Vagina: normal vagina with well healed cuff. Anterior cystocele mild. Bimanual: no evidence of masses, smooth. RV: not indicated   Assessment:  Sandra Hendrix is a 77 y.o. female w/ hx of stage IB, grade 1 endometrioid cancer (positive cytology) 6/18 w/ hx of of disease invading full thickness of myometrium and positive pelvic peritoneal cytology, bilaterial sentinel nodes, cervix, and adnexa were negative. She is s/p whole pelvic radiation and completed 2.5 years of letrozole for positive cytology in December 2020. No evidence of disease on exam.   Stress urinary incontinence stable.   Recent vulvar ulceration and HSV positive.  Healed quickly without therapy.  Medical co-morbidities complicating care include: OSA on CPAP, HTN DM, CKD  Plan:   Problem List Items Addressed This Visit       Genitourinary   Endometrioid adenocarcinoma of uterus ([place]) - Primary   Discussed HSV and informed her in the future to see doctor promptly and get Rx for Acyclovir to shorten duration of symptoms if lesions arise in the future.   I applauded her on her intentional weight loss and exercising regularly.   Given previous use of letrozole I recommend repeating bone density scan as this medication can contribute to bone loss. Previous was in 2020.  Will order today.  Continue surveillance visits every 12 months with Dr Marcelline Mates.  We can see her back if any new concerning issues arise.    Urinary frequency- improved on medication.  Continue follow up with Dr. Marcelline Mates and Dr. Derrel Nip for other medical problems and routine screenings.   I personally interviewed and examined the patient. Agreed with the above/below plan of care. I have directly contributed to assessment and plan of care of this patient and educated and discussed with patient and family.   Mellody Drown, MD  CC: Dr. Marcelline Mates  PCP: Dr. Derrel Nip

## 2022-10-05 NOTE — Telephone Encounter (Signed)
Spoke with pt and she stated that she is not taking Jardiance she is now taking Januvia.

## 2022-10-18 ENCOUNTER — Ambulatory Visit
Admission: RE | Admit: 2022-10-18 | Discharge: 2022-10-18 | Disposition: A | Discharge status: Home / Self Care | Payer: Medicare PPO | Source: Ambulatory Visit | Attending: Nurse Practitioner | Admitting: Nurse Practitioner

## 2022-10-18 DIAGNOSIS — Z780 Asymptomatic menopausal state: Secondary | ICD-10-CM | POA: Diagnosis not present

## 2022-10-18 DIAGNOSIS — C55 Malignant neoplasm of uterus, part unspecified: Secondary | ICD-10-CM | POA: Insufficient documentation

## 2022-10-18 DIAGNOSIS — Z79811 Long term (current) use of aromatase inhibitors: Secondary | ICD-10-CM | POA: Insufficient documentation

## 2022-10-18 DIAGNOSIS — M85832 Other specified disorders of bone density and structure, left forearm: Secondary | ICD-10-CM | POA: Diagnosis not present

## 2022-10-24 ENCOUNTER — Encounter: Payer: Self-pay | Admitting: Urology

## 2022-10-24 ENCOUNTER — Ambulatory Visit: Payer: Medicare PPO | Admitting: Urology

## 2022-10-24 VITALS — BP 126/71 | HR 79 | Ht 61.0 in | Wt 137.1 lb

## 2022-10-24 DIAGNOSIS — N3946 Mixed incontinence: Secondary | ICD-10-CM

## 2022-10-24 MED ORDER — GEMTESA 75 MG PO TABS: 1.0000 | ORAL_TABLET | Freq: Every day | ORAL | 11 refills | Status: DC

## 2022-10-24 NOTE — Addendum Note (Signed)
Addended by: Kris Mouton on: 10/24/2022 04:41 PM   Modules accepted: Orders

## 2022-10-24 NOTE — Progress Notes (Signed)
10/24/2022 3:32 PM   Sandra Hendrix Aug 20, 1946 [id number]  Referring provider: Crecencio Mc, MD [place],  [place] [address]  Chief Complaint  Patient presents with   Follow-up   Urinary Incontinence    Discuss medications    HPI: Reviewed long note. Patient was doing very well 1 year ago on oxybutynin. Doing very well on oxybutynin. No infections     Today I reviewed my last lengthy note.  Her response to overactive bladder medication in the past demonstrates she primarily has an overactive bladder.  Patient is starting to have little bit more from the floor syndrome and urge incontinence during the day.  Clinically not infected.  Has tried trospium Vesicare and Myrbetriq.   PMH: Past Medical History:  Diagnosis Date   Allergy    Anxiety    Arthritis    Barrett's esophagus determined by endoscopy    Cancer ([place])    Uterine Cancer   Chronic kidney disease    Chronic venous insufficiency    Cough variant asthma 02/07/2022   COVID-19 virus infection 07/21/2021   NOTIFIED ON OCT 26 . FEVERS BODY ACHES,  HEADACHES.  SENDING IN [facility]   Depression    Diabetes mellitus without complication ([place])    Difficult intubation    "not sure what happened,asleep was told had problem with intubation"   Diverticulosis    Fatty liver    GERD (gastroesophageal reflux disease)    History of bronchitis    History of hiatal hernia    Hyperlipidemia    Hypertension    Irritable bowel syndrome with constipation    Sleep apnea    Use C- PAP   Urinary incontinence    Varicose veins     Surgical History: Past Surgical History:  Procedure Laterality Date   CARDIAC CATHETERIZATION     CHOLECYSTECTOMY     COLONOSCOPY WITH PROPOFOL N/A 06/14/2016   Procedure: COLONOSCOPY WITH PROPOFOL;  Surgeon: Lollie Sails, MD;  Location[facility] ENDOSCOPY;  Service: Endoscopy;  Laterality: N/A;   COLONOSCOPY WITH PROPOFOL N/A 12/18/2020   Procedure:  COLONOSCOPY WITH PROPOFOL;  Surgeon: Lesly Rubenstein, MD;  Location: [facility] ENDOSCOPY;  Service: Endoscopy;  Laterality: N/A;   COLONOSCOPY WITH PROPOFOL N/A 12/17/2021   Procedure: COLONOSCOPY WITH PROPOFOL;  Surgeon: Annamaria Helling, DO;  Location: [facility] ENDOSCOPY;  Service: Gastroenterology;  Laterality: N/A;  DM   ESOPHAGOGASTRODUODENOSCOPY N/A 12/18/2020   Procedure: ESOPHAGOGASTRODUODENOSCOPY (EGD);  Surgeon: Lesly Rubenstein, MD;  Location: [facility] ENDOSCOPY;  Service: Endoscopy;  Laterality: N/A;   ESOPHAGOGASTRODUODENOSCOPY (EGD) WITH PROPOFOL N/A 06/14/2016   Procedure: ESOPHAGOGASTRODUODENOSCOPY (EGD) WITH PROPOFOL;  Surgeon: Lollie Sails, MD;  Location: [facility] ENDOSCOPY;  Service: Endoscopy;  Laterality: N/A;   ESOPHAGOGASTRODUODENOSCOPY (EGD) WITH PROPOFOL N/A 06/12/2017   Procedure: ESOPHAGOGASTRODUODENOSCOPY (EGD) WITH PROPOFOL;  Surgeon: Lollie Sails, MD;  Location: [facility] ENDOSCOPY;  Service: Endoscopy;  Laterality: N/A;   ESOPHAGOGASTRODUODENOSCOPY (EGD) WITH PROPOFOL N/A 08/25/2017   Procedure: ESOPHAGOGASTRODUODENOSCOPY (EGD) WITH PROPOFOL;  Surgeon: Lollie Sails, MD;  Location: [facility] ENDOSCOPY;  Service: Endoscopy;  Laterality: N/A;   HYSTEROSCOPY WITH D & C N/A 01/02/2017   Procedure: DILATATION AND CURETTAGE /HYSTEROSCOPY;  Surgeon: Brayton Mars, MD;  Location: [facility] ORS;  Service: Gynecology;  Laterality: N/A;   JOINT REPLACEMENT Right 2007   Total Knee Replacement   JOINT REPLACEMENT Left 2004   Total Knee Replacement   LAPAROSCOPIC HYSTERECTOMY Bilateral 03/01/2017   Procedure: HYSTERECTOMY TOTAL LAPAROSCOPIC BSO;  Surgeon: Mellody Drown, MD;  Location: [facility] ORS;  Service: Gynecology;  Laterality: Bilateral;   ROTATOR CUFF REPAIR Right    SENTINEL NODE BIOPSY N/A 03/01/2017   Procedure: SENTINEL NODE INJECTION AND BIOPSY;  Surgeon: Mellody Drown, MD;  Location: [facility] ORS;  Service: Gynecology;  Laterality: N/A;   TONSILLECTOMY     as a child    TUBAL LIGATION     UPPER GI ENDOSCOPY     x2    Home Medications:  Allergies as of 10/24/2022       Reactions   Clindamycin/lincomycin Rash   Codeine Rash   Lincomycin Rash   Morphine And Related Nausea Only   Talwin [pentazocine] Nausea And Vomiting   Clindamycin Other (See Comments)   Morphine Other (See Comments)   Oxycodone Other (See Comments)   Lisinopril Cough   cough   Naloxone Other (See Comments)   Had issues with sedation and vomiting with EGD   Oxycodone-acetaminophen Other (See Comments)   Pt states this medication makes her bowels stop moving   Warfarin And Related Nausea Only        Medication List        Accurate as of October 24, 2022  3:32 PM. If you have any questions, ask your nurse or doctor.          STOP taking these medications    hydrocortisone-pramoxine rectal foam Commonly known as: PROCTOFOAM-HC Stopped by: Reece Packer, MD       TAKE these medications    Accu-Chek Guide test strip Generic drug: glucose blood TEST BLOOD SUGAR TWICE DAILY   accu-chek softclix lancets Use to test blood sugars 1 -2 times daily   acetaminophen 500 MG tablet Commonly known as: TYLENOL Take 2 tablets (1,000 mg total) by mouth every 6 (six) hours. What changed:  when to take this reasons to take this   albuterol 108 (90 Base) MCG/ACT inhaler Commonly known as: VENTOLIN HFA Inhale 2 puffs into the lungs every 6 (six) hours as needed for wheezing or shortness of breath.   Ascorbic Acid 500 MG Chew Chew by mouth.   atorvastatin 40 MG tablet Commonly known as: LIPITOR TAKE 1/2 TABLET EVERY DAY  AT  6  PM   azelastine 0.1 % nasal spray Commonly known as: ASTELIN Place 1 spray into both nostrils daily.   benzonatate 100 MG capsule Commonly known as: TESSALON Take 1 capsule (100 mg total) by mouth 3 (three) times daily as needed for cough.   Biotin 5000 MCG Tabs Take by mouth daily.   blood glucose meter kit and supplies Use to  check blood sugar twice daily. ICD 10: E11.29   Cetirizine HCl 10 MG Caps Take by mouth.   Cinnamon Bark Powd Take by mouth.   COLLAGEN PO Take by mouth daily. For improved fingernail strength   cyanocobalamin 1000 MCG tablet Commonly known as: VITAMIN B12 Take 1,000 mcg by mouth daily.   docusate sodium 100 MG capsule Commonly known as: Colace Take 1 capsule (100 mg total) by mouth 2 (two) times daily.   fluticasone 50 MCG/ACT nasal spray Commonly known as: FLONASE Place 1 spray into both nostrils daily.   fluticasone 50 MCG/BLIST diskus inhaler Commonly known as: FLOVENT DISKUS Inhale 1 puff into the lungs 2 (two) times daily.   furosemide 20 MG tablet Commonly known as: LASIX Take 1 tablet (20 mg total) by mouth 2 (two) times a week. As needed for fluid retention   losartan-hydrochlorothiazide 50-12.5 MG  tablet Commonly known as: HYZAAR TAKE 1 TABLET EVERY DAY   Magnesium 250 MG Tabs Take 1 tablet by mouth daily.   meclizine 25 MG tablet Commonly known as: ANTIVERT Take 1 tablet (25 mg total) by mouth 3 (three) times daily as needed for dizziness.   metFORMIN 500 MG 24 hr tablet Commonly known as: GLUCOPHAGE-XR TAKE 1 TABLET EVERY DAY WITH BREAKFAST   multivitamin tablet Take 1 tablet by mouth daily.   NUTRITIONAL SUPPLEMENT PO Take 1 tablet by mouth. Taking "Useful Brain" supplement daily   oxybutynin 10 MG 24 hr tablet Commonly known as: DITROPAN-XL Take 1 tablet (10 mg total) by mouth daily.   pantoprazole 40 MG tablet Commonly known as: PROTONIX Take 1 tablet by mouth twice daily   potassium chloride SA 20 MEQ tablet Commonly known as: KLOR-CON M Take 1 tablet (20 mEq total) by mouth 2 (two) times a week.   Probiotic-10 Caps Take 1 capsule by mouth daily.   Semaglutide(0.25 or 0.'5MG'$ /DOS) 2 MG/3ML Sopn Inject 0.25 mg into the skin once a week.   sertraline 50 MG tablet Commonly known as: ZOLOFT TAKE 1 TABLET EVERY DAY   sitaGLIPtin 50 MG  tablet Commonly known as: Januvia Take 1 tablet (50 mg total) by mouth daily.   tiZANidine 4 MG tablet Commonly known as: ZANAFLEX tizanidine 4 mg tablet   verapamil 120 MG 24 hr capsule Commonly known as: VERELAN PM TAKE 1 CAPSULE EVERY DAY   Zinc 50 MG Tabs Take by mouth daily.        Allergies:  Allergies  Allergen Reactions   Clindamycin/Lincomycin Rash   Codeine Rash   Lincomycin Rash   Morphine And Related Nausea Only   Talwin [Pentazocine] Nausea And Vomiting   Clindamycin Other (See Comments)   Morphine Other (See Comments)   Oxycodone Other (See Comments)   Lisinopril Cough    cough   Naloxone Other (See Comments)    Had issues with sedation and vomiting with EGD   Oxycodone-Acetaminophen Other (See Comments)    Pt states this medication makes her bowels stop moving   Warfarin And Related Nausea Only    Family History: Family History  Problem Relation Age of Onset   Stroke Mother    Hypertension Mother    Lupus Mother    Alcohol abuse Father    Diabetes Father    Cancer Father        gallbladder ca into liver    Arthritis Sister    Diabetes Sister    Lumbar disc disease Sister    Hyperlipidemia Daughter    Hypertension Daughter    Cancer Maternal Aunt        ovarian ca   Diabetes Paternal Aunt    Drug abuse Paternal Aunt    Cancer Maternal Grandfather        colon ca   Hypertension Brother    Alcohol abuse Brother    Stroke Brother     Social History:  reports that she has never smoked. She has never been exposed to tobacco smoke. She has never used smokeless tobacco. She reports that she does not currently use alcohol. She reports that she does not use drugs.  ROS:                                        Physical Exam: There were no vitals taken for this visit.  Constitutional:  Alert and oriented, No acute distress. HEENT: [place] AT, moist mucus membranes.  Trachea midline, no masses.  Laboratory Data: Lab  Results  Component Value Date   WBC 3.0 (L) 08/30/2022   HGB 13.2 08/30/2022   HCT 39.5 08/30/2022   MCV 88.6 08/30/2022   PLT 301.0 08/30/2022    Lab Results  Component Value Date   CREATININE 0.96 08/30/2022    No results found for: "PSA"  No results found for: "TESTOSTERONE"  Lab Results  Component Value Date   HGBA1C 8.3 (H) 08/30/2022    Urinalysis    Component Value Date/Time   COLORURINE YELLOW 06/28/2022 1452   APPEARANCEUR CLEAR 06/28/2022 1452   APPEARANCEUR Clear 06/20/2022 1324   LABSPEC <=1.005 (A) 06/28/2022 1452   PHURINE 6.5 06/28/2022 1452   GLUCOSEU >=1000 (A) 06/28/2022 1452   HGBUR NEGATIVE 06/28/2022 1452   BILIRUBINUR NEGATIVE 06/28/2022 1452   BILIRUBINUR Negative 06/20/2022 1324   KETONESUR NEGATIVE 06/28/2022 1452   PROTEINUR Negative 06/20/2022 1324   PROTEINUR 30 (A) 02/20/2015 2137   UROBILINOGEN 0.2 06/28/2022 1452   NITRITE NEGATIVE 06/28/2022 1452   LEUKOCYTESUR NEGATIVE 06/28/2022 1452    Pertinent Imaging:   Assessment & Plan: I thought was reasonable to try Gemtesa.  Call if culture positive.  Discussed 3 refractory treatments next visit or go back on oxybutynin perhaps a higher dose and/or see physical therapy  There are no diagnoses linked to this encounter.  No follow-ups on file.  Reece Packer, MD  [facility] [address] [place], [place] [address] [phone]

## 2022-11-08 ENCOUNTER — Telehealth: Payer: Self-pay

## 2022-11-08 MED ORDER — SEMAGLUTIDE(0.25 OR 0.5MG/DOS) 2 MG/3ML ~~LOC~~ SOPN: 0.2500 mg | PEN_INJECTOR | SUBCUTANEOUS | 2 refills | Status: DC

## 2022-11-08 MED ORDER — FUROSEMIDE 20 MG PO TABS: 20.0000 mg | ORAL_TABLET | ORAL | 3 refills | Status: DC

## 2022-11-08 NOTE — Telephone Encounter (Signed)
Prescription Request  11/08/2022  Is this a "Controlled Substance" medicine? No  LOV: Visit date not found  What is the name of the medication or equipment? furosemide (LASIX) 20 MG tablet (refill) and Semaglutide,0.25 or 0.5MG/DOS, 2 MG/3ML SOPN (New prescription needed for this one)  Have you contacted your pharmacy to request a refill? Yes   Which pharmacy would you like this sent to?   [facility], [place] [address] Phone: [phone] Fax: [phone]    Patient notified that their request is being sent to the clinical staff for review and that they should receive a response within 2 business days.   Please advise at Mobile [phone] (mobile)   Patient states she has one more week of the Semaglutide,0.25 or 0.5MG/DOS, 2 MG/3ML SOPN left and several days worth left of the furosemide (LASIX) 20 MG tablet

## 2022-11-08 NOTE — Telephone Encounter (Signed)
Meds refilled.

## 2022-11-10 ENCOUNTER — Other Ambulatory Visit
Admission: RE | Admit: 2022-11-10 | Discharge: 2022-11-10 | Disposition: A | Discharge status: Home / Self Care | Payer: Medicare PPO | Source: Ambulatory Visit | Attending: Student | Admitting: Student

## 2022-11-10 DIAGNOSIS — R059 Cough, unspecified: Secondary | ICD-10-CM | POA: Diagnosis not present

## 2022-11-10 DIAGNOSIS — Z03818 Encounter for observation for suspected exposure to other biological agents ruled out: Secondary | ICD-10-CM | POA: Insufficient documentation

## 2022-11-10 DIAGNOSIS — K449 Diaphragmatic hernia without obstruction or gangrene: Secondary | ICD-10-CM | POA: Diagnosis not present

## 2022-11-10 DIAGNOSIS — J029 Acute pharyngitis, unspecified: Secondary | ICD-10-CM | POA: Diagnosis not present

## 2022-11-10 DIAGNOSIS — K30 Functional dyspepsia: Secondary | ICD-10-CM | POA: Insufficient documentation

## 2022-11-10 DIAGNOSIS — R051 Acute cough: Secondary | ICD-10-CM | POA: Diagnosis not present

## 2022-11-10 LAB — TROPONIN I (HIGH SENSITIVITY): Troponin I (High Sensitivity): 2 ng/L (ref <18)

## 2022-12-01 ENCOUNTER — Other Ambulatory Visit: Payer: Self-pay | Admitting: Internal Medicine

## 2022-12-05 ENCOUNTER — Ambulatory Visit: Payer: Medicare PPO | Admitting: Urology

## 2022-12-18 DIAGNOSIS — B9689 Other specified bacterial agents as the cause of diseases classified elsewhere: Secondary | ICD-10-CM | POA: Diagnosis not present

## 2022-12-18 DIAGNOSIS — J329 Chronic sinusitis, unspecified: Secondary | ICD-10-CM | POA: Diagnosis not present

## 2022-12-27 ENCOUNTER — Telehealth: Payer: Self-pay

## 2022-12-27 NOTE — Patient Outreach (Signed)
  Care Coordination   Initial Visit Note   12/27/2022 Name: GARNIE DERICO MRN: [id number] DOB: March 29, 1946  Joetta Manners Sansone is a 77 y.o. year old female who sees Derrel Nip, Aris Everts, MD for primary care. I spoke with  Joetta Manners Fritzsche by phone today.  What matters to the patients health and wellness today?  Patient denies having any nursing or community resource needs.    Goals Addressed             This Visit's Progress    care coordination activities - no further follow up needed.       Interventions Today    Flowsheet Row Most Recent Value  General Interventions   General Interventions Discussed/Reviewed General Interventions Discussed  [Care coordination program/ services discussed. SDOH survey completed, AWV discussed and patient advised to contact provider office to schedule.  Advised to contact primary provider office if care coordination services needed in the future.]              SDOH assessments and interventions completed:  No     Care Coordination Interventions:  Yes, provided   Follow up plan: No further intervention required.   Encounter Outcome:  Pt. Visit Completed   Quinn Plowman RN,BSN,CCM [place] [phone] direct line

## 2022-12-28 ENCOUNTER — Telehealth: Payer: Self-pay | Admitting: Internal Medicine

## 2022-12-28 NOTE — Telephone Encounter (Signed)
Called patient to schedule Medicare Annual Wellness Visit (AWV). Left message for patient to call back and schedule Medicare Annual Wellness Visit (AWV).  Last date of AWV: 12/28/2021   Please schedule an AWVS appointment at any time with [facility].  If any questions, please contact me at [phone].    Thank you,  [place] Direct dial  [phone]

## 2023-01-10 ENCOUNTER — Other Ambulatory Visit: Payer: Self-pay | Admitting: Internal Medicine

## 2023-01-17 DIAGNOSIS — M13842 Other specified arthritis, left hand: Secondary | ICD-10-CM | POA: Diagnosis not present

## 2023-01-17 DIAGNOSIS — M79645 Pain in left finger(s): Secondary | ICD-10-CM | POA: Diagnosis not present

## 2023-01-21 ENCOUNTER — Other Ambulatory Visit: Payer: Self-pay | Admitting: Internal Medicine

## 2023-01-23 ENCOUNTER — Ambulatory Visit: Payer: Medicare PPO | Admitting: Urology

## 2023-01-23 VITALS — BP 123/69 | HR 72 | Ht 61.0 in | Wt 137.0 lb

## 2023-01-23 DIAGNOSIS — N3946 Mixed incontinence: Secondary | ICD-10-CM

## 2023-01-23 LAB — URINALYSIS, COMPLETE
Bilirubin, UA: NEGATIVE
Glucose, UA: NEGATIVE
Ketones, UA: NEGATIVE
Leukocytes,UA: NEGATIVE
Nitrite, UA: NEGATIVE
Protein,UA: NEGATIVE
RBC, UA: NEGATIVE
Specific Gravity, UA: 1.015 (ref 1.005–1.030)
Urobilinogen, Ur: 0.2 mg/dL (ref 0.2–1.0)
pH, UA: 5.5 (ref 5.0–7.5)

## 2023-01-23 LAB — MICROSCOPIC EXAMINATION: Bacteria, UA: NONE SEEN

## 2023-01-23 NOTE — Progress Notes (Signed)
01/23/2023 10:46 AM   Sandra Hendrix 05/02/1946 [id number]  Referring provider: Sherlene Shams, MD [address] [place],  [place] [address]  Chief Complaint  Patient presents with   Follow-up    HPI: Reviewed long note. Patient was doing very well 1 year ago on oxybutynin. Doing very well on oxybutynin. No infections      Today I reviewed my last lengthy note.  Her response to overactive bladder medication in the past demonstrates she primarily has an overactive bladder.   Patient is starting to have little bit more from the floor syndrome and urge incontinence during the day.  Clinically not infected.  Has tried trospium Vesicare and Myrbetriq.    thought was reasonable to try Gemtesa. Call if culture positive. Discussed 3 refractory treatments next visit or go back on oxybutynin perhaps a higher dose and/or see physical therapy   Today Urgent continence dramatically better on Gemtesa.  Frequency much better.  Very happy.  To $80    PMH: Past Medical History:  Diagnosis Date   Allergy    Anxiety    Arthritis    Barrett's esophagus determined by endoscopy    Cancer (HCC)    Uterine Cancer   Chronic kidney disease    Chronic venous insufficiency    Cough variant asthma 02/07/2022   COVID-19 virus infection 07/21/2021   NOTIFIED ON OCT 26 . FEVERS BODY ACHES,  HEADACHES.  SENDING IN MOLNUPIRAVIR   Depression    Diabetes mellitus without complication (HCC)    Difficult intubation    "not sure what happened,asleep was told had problem with intubation"   Diverticulosis    Fatty liver    GERD (gastroesophageal reflux disease)    History of bronchitis    History of hiatal hernia    Hyperlipidemia    Hypertension    Irritable bowel syndrome with constipation    Sleep apnea    Use C- PAP   Urinary incontinence    Varicose veins     Surgical History: Past Surgical History:  Procedure Laterality Date   CARDIAC CATHETERIZATION     CHOLECYSTECTOMY      COLONOSCOPY WITH PROPOFOL N/A 06/14/2016   Procedure: COLONOSCOPY WITH PROPOFOL;  Surgeon: Christena Deem, MD;  Location: [facility] ENDOSCOPY;  Service: Endoscopy;  Laterality: N/A;   COLONOSCOPY WITH PROPOFOL N/A 12/18/2020   Procedure: COLONOSCOPY WITH PROPOFOL;  Surgeon: Regis Bill, MD;  Location: [facility] ENDOSCOPY;  Service: Endoscopy;  Laterality: N/A;   COLONOSCOPY WITH PROPOFOL N/A 12/17/2021   Procedure: COLONOSCOPY WITH PROPOFOL;  Surgeon: Jaynie Collins, DO;  Location: [facility] ENDOSCOPY;  Service: Gastroenterology;  Laterality: N/A;  DM   ESOPHAGOGASTRODUODENOSCOPY N/A 12/18/2020   Procedure: ESOPHAGOGASTRODUODENOSCOPY (EGD);  Surgeon: Regis Bill, MD;  Location: [facility] ENDOSCOPY;  Service: Endoscopy;  Laterality: N/A;   ESOPHAGOGASTRODUODENOSCOPY (EGD) WITH PROPOFOL N/A 06/14/2016   Procedure: ESOPHAGOGASTRODUODENOSCOPY (EGD) WITH PROPOFOL;  Surgeon: Christena Deem, MD;  Location: [facility] ENDOSCOPY;  Service: Endoscopy;  Laterality: N/A;   ESOPHAGOGASTRODUODENOSCOPY (EGD) WITH PROPOFOL N/A 06/12/2017   Procedure: ESOPHAGOGASTRODUODENOSCOPY (EGD) WITH PROPOFOL;  Surgeon: Christena Deem, MD;  Location: [facility] ENDOSCOPY;  Service: Endoscopy;  Laterality: N/A;   ESOPHAGOGASTRODUODENOSCOPY (EGD) WITH PROPOFOL N/A 08/25/2017   Procedure: ESOPHAGOGASTRODUODENOSCOPY (EGD) WITH PROPOFOL;  Surgeon: Christena Deem, MD;  Location: [facility] ENDOSCOPY;  Service: Endoscopy;  Laterality: N/A;   HYSTEROSCOPY WITH D & C N/A 01/02/2017   Procedure: DILATATION AND CURETTAGE /HYSTEROSCOPY;  Surgeon: Herold Harms, MD;  Location: [facility] ORS;  Service: Gynecology;  Laterality: N/A;   JOINT REPLACEMENT Right 2007   Total Knee Replacement   JOINT REPLACEMENT Left 2004   Total Knee Replacement   LAPAROSCOPIC HYSTERECTOMY Bilateral 03/01/2017   Procedure: HYSTERECTOMY TOTAL LAPAROSCOPIC BSO;  Surgeon: Leida Lauth, MD;  Location: [facility] ORS;  Service: Gynecology;  Laterality: Bilateral;    ROTATOR CUFF REPAIR Right    SENTINEL NODE BIOPSY N/A 03/01/2017   Procedure: SENTINEL NODE INJECTION AND BIOPSY;  Surgeon: Leida Lauth, MD;  Location: [facility] ORS;  Service: Gynecology;  Laterality: N/A;   TONSILLECTOMY     as a child   TUBAL LIGATION     UPPER GI ENDOSCOPY     x2    Home Medications:  Allergies as of 01/23/2023       Reactions   Clindamycin/lincomycin Rash   Codeine Rash   Lincomycin Rash   Morphine And Related Nausea Only   Talwin [pentazocine] Nausea And Vomiting   Clindamycin Other (See Comments)   Morphine Other (See Comments)   Oxycodone Other (See Comments)   Lisinopril Cough   cough   Naloxone Other (See Comments)   Had issues with sedation and vomiting with EGD   Oxycodone-acetaminophen Other (See Comments)   Pt states this medication makes her bowels stop moving   Warfarin And Related Nausea Only        Medication List        Accurate as of January 23, 2023 10:46 AM. If you have any questions, ask your nurse or doctor.          Accu-Chek Guide test strip Generic drug: glucose blood TEST BLOOD SUGAR TWICE DAILY   accu-chek softclix lancets Use to test blood sugars 1 -2 times daily   acetaminophen 500 MG tablet Commonly known as: TYLENOL Take 2 tablets (1,000 mg total) by mouth every 6 (six) hours. What changed:  when to take this reasons to take this   albuterol 108 (90 Base) MCG/ACT inhaler Commonly known as: VENTOLIN HFA Inhale 2 puffs into the lungs every 6 (six) hours as needed for wheezing or shortness of breath.   Ascorbic Acid 500 MG Chew Chew by mouth.   atorvastatin 40 MG tablet Commonly known as: LIPITOR TAKE 1/2 TABLET EVERY DAY  AT  6  PM   azelastine 0.1 % nasal spray Commonly known as: ASTELIN Place 1 spray into both nostrils daily.   benzonatate 100 MG capsule Commonly known as: TESSALON Take 1 capsule (100 mg total) by mouth 3 (three) times daily as needed for cough.   Biotin 5000 MCG Tabs Take by  mouth daily.   blood glucose meter kit and supplies Use to check blood sugar twice daily. ICD 10: E11.29   Cetirizine HCl 10 MG Caps Take by mouth.   Cinnamon Bark Powd Take by mouth.   COLLAGEN PO Take by mouth daily. For improved fingernail strength   cyanocobalamin 1000 MCG tablet Commonly known as: VITAMIN B12 Take 1,000 mcg by mouth daily.   docusate sodium 100 MG capsule Commonly known as: Colace Take 1 capsule (100 mg total) by mouth 2 (two) times daily.   fluticasone 50 MCG/ACT nasal spray Commonly known as: FLONASE Place 1 spray into both nostrils daily.   fluticasone 50 MCG/BLIST diskus inhaler Commonly known as: FLOVENT DISKUS Inhale 1 puff into the lungs 2 (two) times daily.   furosemide 20 MG tablet Commonly known as: LASIX Take 1 tablet (20 mg total) by mouth 2 (two) times  a week. As needed for fluid retention   Gemtesa 75 MG Tabs Generic drug: Vibegron Take 1 tablet (75 mg total) by mouth daily.   losartan-hydrochlorothiazide 50-12.5 MG tablet Commonly known as: HYZAAR TAKE 1 TABLET EVERY DAY   Magnesium 250 MG Tabs Take 1 tablet by mouth daily.   meclizine 25 MG tablet Commonly known as: ANTIVERT Take 1 tablet (25 mg total) by mouth 3 (three) times daily as needed for dizziness.   metFORMIN 500 MG 24 hr tablet Commonly known as: GLUCOPHAGE-XR TAKE 1 TABLET EVERY DAY WITH BREAKFAST   multivitamin tablet Take 1 tablet by mouth daily.   NUTRITIONAL SUPPLEMENT PO Take 1 tablet by mouth. Taking "Useful Brain" supplement daily   pantoprazole 40 MG tablet Commonly known as: PROTONIX Take 1 tablet by mouth twice daily   potassium chloride SA 20 MEQ tablet Commonly known as: KLOR-CON M TAKE 1 TABLET 2 TIMES A WEEK.   Probiotic-10 Caps Take 1 capsule by mouth daily.   Semaglutide(0.25 or 0.5MG /DOS) 2 MG/3ML Sopn Inject 0.25 mg into the skin once a week.   sertraline 50 MG tablet Commonly known as: ZOLOFT TAKE 1 TABLET EVERY DAY    sitaGLIPtin 50 MG tablet Commonly known as: Januvia Take 1 tablet (50 mg total) by mouth daily.   tiZANidine 4 MG tablet Commonly known as: ZANAFLEX tizanidine 4 mg tablet   verapamil 120 MG 24 hr capsule Commonly known as: VERELAN TAKE 1 CAPSULE EVERY DAY   Zinc 50 MG Tabs Take by mouth daily.        Allergies:  Allergies  Allergen Reactions   Clindamycin/Lincomycin Rash   Codeine Rash   Lincomycin Rash   Morphine And Related Nausea Only   Talwin [Pentazocine] Nausea And Vomiting   Clindamycin Other (See Comments)   Morphine Other (See Comments)   Oxycodone Other (See Comments)   Lisinopril Cough    cough   Naloxone Other (See Comments)    Had issues with sedation and vomiting with EGD   Oxycodone-Acetaminophen Other (See Comments)    Pt states this medication makes her bowels stop moving   Warfarin And Related Nausea Only    Family History: Family History  Problem Relation Age of Onset   Stroke Mother    Hypertension Mother    Lupus Mother    Alcohol abuse Father    Diabetes Father    Cancer Father        gallbladder ca into liver    Arthritis Sister    Diabetes Sister    Lumbar disc disease Sister    Hyperlipidemia Daughter    Hypertension Daughter    Cancer Maternal Aunt        ovarian ca   Diabetes Paternal Aunt    Drug abuse Paternal Aunt    Cancer Maternal Grandfather        colon ca   Hypertension Brother    Alcohol abuse Brother    Stroke Brother     Social History:  reports that she has never smoked. She has never been exposed to tobacco smoke. She has never used smokeless tobacco. She reports that she does not currently use alcohol. She reports that she does not use drugs.  ROS:                                        Physical Exam: BP 123/69   Pulse 72  Ht 5\' 1"  (1.549 m)   Wt 62.1 kg   BMI 25.89 kg/m   Constitutional:  Alert and oriented, No acute distress. HEENT: [place] AT, moist mucus membranes.   Trachea midline, no masses.  Laboratory Data: Lab Results  Component Value Date   WBC 3.0 (L) 08/30/2022   HGB 13.2 08/30/2022   HCT 39.5 08/30/2022   MCV 88.6 08/30/2022   PLT 301.0 08/30/2022    Lab Results  Component Value Date   CREATININE 0.96 08/30/2022    No results found for: "PSA"  No results found for: "TESTOSTERONE"  Lab Results  Component Value Date   HGBA1C 8.3 (H) 08/30/2022    Urinalysis    Component Value Date/Time   COLORURINE YELLOW 06/28/2022 1452   APPEARANCEUR CLEAR 06/28/2022 1452   APPEARANCEUR Clear 06/20/2022 1324   LABSPEC <=1.005 (A) 06/28/2022 1452   PHURINE 6.5 06/28/2022 1452   GLUCOSEU >=1000 (A) 06/28/2022 1452   HGBUR NEGATIVE 06/28/2022 1452   BILIRUBINUR NEGATIVE 06/28/2022 1452   BILIRUBINUR Negative 06/20/2022 1324   KETONESUR NEGATIVE 06/28/2022 1452   PROTEINUR Negative 06/20/2022 1324   PROTEINUR 30 (A) 02/20/2015 2137   UROBILINOGEN 0.2 06/28/2022 1452   NITRITE NEGATIVE 06/28/2022 1452   LEUKOCYTESUR NEGATIVE 06/28/2022 1452    Pertinent Imaging:   Assessment & Plan: 6 weeks of samples given to help with cost.  See in 6 months for more samples  1. Mixed incontinence  - Urinalysis, Complete   No follow-ups on file.  Martina Sinner, MD  [facility] [address], [address] [place], [place] [address] [phone]

## 2023-01-24 ENCOUNTER — Encounter: Payer: Self-pay | Admitting: Internal Medicine

## 2023-01-24 DIAGNOSIS — E1122 Type 2 diabetes mellitus with diabetic chronic kidney disease: Secondary | ICD-10-CM | POA: Diagnosis not present

## 2023-01-24 DIAGNOSIS — N182 Chronic kidney disease, stage 2 (mild): Secondary | ICD-10-CM | POA: Diagnosis not present

## 2023-01-24 LAB — MICROALBUMIN / CREATININE URINE RATIO: Microalb Creat Ratio: 10

## 2023-01-24 LAB — PROTEIN / CREATININE RATIO, URINE: Creatinine, Urine: 52

## 2023-01-24 LAB — MICROALBUMIN, URINE: Microalb, Ur: 5

## 2023-01-26 ENCOUNTER — Encounter: Payer: Self-pay | Admitting: Internal Medicine

## 2023-02-02 ENCOUNTER — Ambulatory Visit: Payer: Medicare PPO | Admitting: Internal Medicine

## 2023-02-02 MED ORDER — METFORMIN HCL ER 500 MG PO TB24: ORAL_TABLET | ORAL | 0 refills | Status: DC

## 2023-02-04 NOTE — Telephone Encounter (Signed)
PA initiated on CMM for Ozempic  Sandra Hendrix (Key: [id number])  Your information has been sent to [facility].

## 2023-02-07 ENCOUNTER — Other Ambulatory Visit: Payer: Self-pay | Admitting: Internal Medicine

## 2023-02-07 ENCOUNTER — Other Ambulatory Visit: Payer: Self-pay | Admitting: Family

## 2023-02-07 NOTE — Progress Notes (Unsigned)
MRN : [id number]  Sandra Hendrix is a 77 y.o. (05-22-1946) female who presents with chief complaint of check circulation.  History of Present Illness:  The patient has a long history of hypertension.    The patient does have family history of hypertension.    There is no prior documented abdominal bruit. The patient occasionally has flushing symptoms but denies palpitations. No episodes of syncope.There is no history of headache. There is no history of flash pulmonary edema.   The patient denies a history of renal disease.   The patient denies amaurosis fugax or recent TIA symptoms. There are no recent neurological changes noted. The patient denies claudication symptoms or rest pain symptoms. The patient denies history of DVT, PE or superficial thrombophlebitis. The patient denies recent episodes of angina or shortness of breath.    Duplex ultrasound of the renal arteries shows 40-59% right renal artery stenosis and <40% left renal artery stenosis. No significant change compared to last study.    No outpatient medications have been marked as taking for the 02/09/23 encounter (Appointment) with Gilda Crease, Latina Craver, MD.    Past Medical History:  Diagnosis Date   Allergy    Anxiety    Arthritis    Barrett's esophagus determined by endoscopy    Cancer [facility])    Uterine Cancer   Chronic kidney disease    Chronic venous insufficiency    Cough variant asthma 02/07/2022   COVID-19 virus infection 07/21/2021   NOTIFIED ON OCT 26 . FEVERS BODY ACHES,  HEADACHES.  SENDING IN MOLNUPIRAVIR   Depression    Diabetes mellitus without complication (HCC)    Difficult intubation    "not sure what happened,asleep was told had problem with intubation"   Diverticulosis    Fatty liver    GERD (gastroesophageal reflux disease)    History of bronchitis    History of hiatal hernia    Hyperlipidemia    Hypertension     Irritable bowel syndrome with constipation    Sleep apnea    Use C- PAP   Urinary incontinence    Varicose veins     Past Surgical History:  Procedure Laterality Date   CARDIAC CATHETERIZATION     CHOLECYSTECTOMY     COLONOSCOPY WITH PROPOFOL N/A 06/14/2016   Procedure: COLONOSCOPY WITH PROPOFOL;  Surgeon: Christena Deem, MD;  Location: [facility] ENDOSCOPY;  Service: Endoscopy;  Laterality: N/A;   COLONOSCOPY WITH PROPOFOL N/A 12/18/2020   Procedure: COLONOSCOPY WITH PROPOFOL;  Surgeon: Regis Bill, MD;  Location: [facility] ENDOSCOPY;  Service: Endoscopy;  Laterality: N/A;   COLONOSCOPY WITH PROPOFOL N/A 12/17/2021   Procedure: COLONOSCOPY WITH PROPOFOL;  Surgeon: Jaynie Collins, DO;  Location: [facility] ENDOSCOPY;  Service: Gastroenterology;  Laterality: N/A;  DM   ESOPHAGOGASTRODUODENOSCOPY N/A 12/18/2020   Procedure: ESOPHAGOGASTRODUODENOSCOPY (EGD);  Surgeon: Regis Bill, MD;  Location: [facility] ENDOSCOPY;  Service: Endoscopy;  Laterality: N/A;   ESOPHAGOGASTRODUODENOSCOPY (EGD) WITH PROPOFOL N/A 06/14/2016   Procedure: ESOPHAGOGASTRODUODENOSCOPY (EGD) WITH PROPOFOL;  Surgeon: Christena Deem, MD;  Location: [facility] ENDOSCOPY;  Service: Endoscopy;  Laterality: N/A;  ESOPHAGOGASTRODUODENOSCOPY (EGD) WITH PROPOFOL N/A 06/12/2017   Procedure: ESOPHAGOGASTRODUODENOSCOPY (EGD) WITH PROPOFOL;  Surgeon: Christena Deem, MD;  Location: [facility] ENDOSCOPY;  Service: Endoscopy;  Laterality: N/A;   ESOPHAGOGASTRODUODENOSCOPY (EGD) WITH PROPOFOL N/A 08/25/2017   Procedure: ESOPHAGOGASTRODUODENOSCOPY (EGD) WITH PROPOFOL;  Surgeon: Christena Deem, MD;  Location: [facility] ENDOSCOPY;  Service: Endoscopy;  Laterality: N/A;   HYSTEROSCOPY WITH D & C N/A 01/02/2017   Procedure: DILATATION AND CURETTAGE /HYSTEROSCOPY;  Surgeon: Herold Harms, MD;  Location: [facility] ORS;  Service: Gynecology;  Laterality: N/A;   JOINT REPLACEMENT Right 2007   Total Knee Replacement   JOINT REPLACEMENT Left 2004    Total Knee Replacement   LAPAROSCOPIC HYSTERECTOMY Bilateral 03/01/2017   Procedure: HYSTERECTOMY TOTAL LAPAROSCOPIC BSO;  Surgeon: Leida Lauth, MD;  Location: [facility] ORS;  Service: Gynecology;  Laterality: Bilateral;   ROTATOR CUFF REPAIR Right    SENTINEL NODE BIOPSY N/A 03/01/2017   Procedure: SENTINEL NODE INJECTION AND BIOPSY;  Surgeon: Leida Lauth, MD;  Location: [facility] ORS;  Service: Gynecology;  Laterality: N/A;   TONSILLECTOMY     as a child   TUBAL LIGATION     UPPER GI ENDOSCOPY     x2    Social History Social History   Tobacco Use   Smoking status: Never    Passive exposure: Never   Smokeless tobacco: Never  Vaping Use   Vaping Use: Never used  Substance Use Topics   Alcohol use: Not Currently    Comment: rare   Drug use: No    Family History Family History  Problem Relation Age of Onset   Stroke Mother    Hypertension Mother    Lupus Mother    Alcohol abuse Father    Diabetes Father    Cancer Father        gallbladder ca into liver    Arthritis Sister    Diabetes Sister    Lumbar disc disease Sister    Hyperlipidemia Daughter    Hypertension Daughter    Cancer Maternal Aunt        ovarian ca   Diabetes Paternal Aunt    Drug abuse Paternal Aunt    Cancer Maternal Grandfather        colon ca   Hypertension Brother    Alcohol abuse Brother    Stroke Brother     Allergies  Allergen Reactions   Clindamycin/Lincomycin Rash   Codeine Rash   Lincomycin Rash   Morphine And Related Nausea Only   Talwin [Pentazocine] Nausea And Vomiting   Clindamycin Other (See Comments)   Morphine Other (See Comments)   Oxycodone Other (See Comments)   Lisinopril Cough    cough   Naloxone Other (See Comments)    Had issues with sedation and vomiting with EGD   Oxycodone-Acetaminophen Other (See Comments)    Pt states this medication makes her bowels stop moving   Warfarin And Related Nausea Only     REVIEW OF SYSTEMS (Negative unless  checked)  Constitutional: [] Weight loss  [] Fever  [] Chills Cardiac: [] Chest pain   [] Chest pressure   [] Palpitations   [] Shortness of breath when laying flat   [] Shortness of breath with exertion. Vascular:  [x] Pain in legs with walking   [] Pain in legs at rest  [] History of DVT   [] Phlebitis   [] Swelling in legs   [] Varicose veins   [] Non-healing ulcers Pulmonary:   [] Uses home oxygen   [] Productive cough   [] Hemoptysis   [] Wheeze  [] COPD   [  x]Asthma Neurologic:  [] Dizziness   [] Seizures   [] History of stroke   [] History of TIA  [] Aphasia   [] Vissual changes   [] Weakness or numbness in arm   [] Weakness or numbness in leg Musculoskeletal:   [] Joint swelling   [] Joint pain   [] Low back pain Hematologic:  [] Easy bruising  [] Easy bleeding   [] Hypercoagulable state   [] Anemic Gastrointestinal:  [] Diarrhea   [] Vomiting  [x] Gastroesophageal reflux/heartburn   [] Difficulty swallowing. Genitourinary:  [] Chronic kidney disease   [] Difficult urination  [] Frequent urination   [] Blood in urine Skin:  [] Rashes   [] Ulcers  Psychological:  [] History of anxiety   []  History of major depression.  Physical Examination  There were no vitals filed for this visit. There is no height or weight on file to calculate BMI. Gen: WD/WN, NAD Head: [place]/AT, No temporalis wasting.  Ear/Nose/Throat: Hearing grossly intact, nares w/o erythema or drainage Eyes: PER, EOMI, sclera nonicteric.  Neck: Supple, no masses.  No bruit or JVD.  Pulmonary:  Good air movement, no audible wheezing, no use of accessory muscles.  Cardiac: RRR, normal S1, S2, no Murmurs. Vascular:  mild trophic changes, no open wounds Vessel Right Left  Radial Palpable Palpable  PT Not Palpable Not Palpable  DP Not Palpable Not Palpable  Gastrointestinal: soft, non-distended. No guarding/no peritoneal signs.  Musculoskeletal: M/S 5/5 throughout.  No visible deformity.  Neurologic: CN 2-12 intact. Pain and light touch intact in extremities.   Symmetrical.  Speech is fluent. Motor exam as listed above. Psychiatric: Judgment intact, Mood & affect appropriate for pt's clinical situation. Dermatologic: No rashes or ulcers noted.  No changes consistent with cellulitis.   CBC Lab Results  Component Value Date   WBC 3.0 (L) 08/30/2022   HGB 13.2 08/30/2022   HCT 39.5 08/30/2022   MCV 88.6 08/30/2022   PLT 301.0 08/30/2022    BMET    Component Value Date/Time   NA 138 08/30/2022 0838   NA 142 11/18/2014 0000   K 3.5 08/30/2022 0838   CL 100 08/30/2022 0838   CO2 28 08/30/2022 0838   GLUCOSE 168 (H) 08/30/2022 0838   BUN 26 (H) 08/30/2022 0838   BUN 21 11/29/2016 0000   CREATININE 0.96 08/30/2022 0838   CALCIUM 9.6 08/30/2022 0838   GFRNONAA >60 12/08/2020 1525   GFRAA 60 (L) 02/27/2017 1117   CrCl cannot be calculated (Patient's most recent lab result is older than the maximum 21 days allowed.).  COAG Lab Results  Component Value Date   INR 1.1 01/23/2009    Radiology No results found.   Assessment/Plan There are no diagnoses linked to this encounter.   Levora Dredge, MD  02/07/2023 11:57 AM

## 2023-02-07 NOTE — Telephone Encounter (Signed)
Ozempic has been approved

## 2023-02-09 ENCOUNTER — Encounter (INDEPENDENT_AMBULATORY_CARE_PROVIDER_SITE_OTHER): Payer: Self-pay | Admitting: Vascular Surgery

## 2023-02-09 ENCOUNTER — Ambulatory Visit (INDEPENDENT_AMBULATORY_CARE_PROVIDER_SITE_OTHER): Payer: Medicare PPO | Admitting: Vascular Surgery

## 2023-02-09 ENCOUNTER — Ambulatory Visit (INDEPENDENT_AMBULATORY_CARE_PROVIDER_SITE_OTHER): Payer: Medicare PPO

## 2023-02-09 VITALS — BP 129/84 | HR 64 | Resp 16 | Ht 60.0 in | Wt 141.0 lb

## 2023-02-09 DIAGNOSIS — E1122 Type 2 diabetes mellitus with diabetic chronic kidney disease: Secondary | ICD-10-CM

## 2023-02-09 DIAGNOSIS — J4520 Mild intermittent asthma, uncomplicated: Secondary | ICD-10-CM | POA: Diagnosis not present

## 2023-02-09 DIAGNOSIS — I129 Hypertensive chronic kidney disease with stage 1 through stage 4 chronic kidney disease, or unspecified chronic kidney disease: Secondary | ICD-10-CM

## 2023-02-09 DIAGNOSIS — I872 Venous insufficiency (chronic) (peripheral): Secondary | ICD-10-CM

## 2023-02-09 DIAGNOSIS — E785 Hyperlipidemia, unspecified: Secondary | ICD-10-CM | POA: Diagnosis not present

## 2023-02-09 DIAGNOSIS — N183 Chronic kidney disease, stage 3 unspecified: Secondary | ICD-10-CM

## 2023-02-09 DIAGNOSIS — I701 Atherosclerosis of renal artery: Secondary | ICD-10-CM | POA: Diagnosis not present

## 2023-02-09 DIAGNOSIS — I10 Essential (primary) hypertension: Secondary | ICD-10-CM | POA: Diagnosis not present

## 2023-02-10 DIAGNOSIS — M25361 Other instability, right knee: Secondary | ICD-10-CM | POA: Diagnosis not present

## 2023-02-14 ENCOUNTER — Encounter: Payer: Self-pay | Admitting: Internal Medicine

## 2023-02-14 ENCOUNTER — Ambulatory Visit: Payer: Medicare PPO | Admitting: Internal Medicine

## 2023-02-14 VITALS — BP 102/64 | HR 76 | Temp 98.1°F | Ht 60.0 in | Wt 140.6 lb

## 2023-02-14 DIAGNOSIS — I129 Hypertensive chronic kidney disease with stage 1 through stage 4 chronic kidney disease, or unspecified chronic kidney disease: Secondary | ICD-10-CM | POA: Diagnosis not present

## 2023-02-14 DIAGNOSIS — N183 Chronic kidney disease, stage 3 unspecified: Secondary | ICD-10-CM

## 2023-02-14 DIAGNOSIS — M79606 Pain in leg, unspecified: Secondary | ICD-10-CM | POA: Diagnosis not present

## 2023-02-14 DIAGNOSIS — E1122 Type 2 diabetes mellitus with diabetic chronic kidney disease: Secondary | ICD-10-CM | POA: Diagnosis not present

## 2023-02-14 DIAGNOSIS — E1169 Type 2 diabetes mellitus with other specified complication: Secondary | ICD-10-CM | POA: Diagnosis not present

## 2023-02-14 DIAGNOSIS — Z7985 Long-term (current) use of injectable non-insulin antidiabetic drugs: Secondary | ICD-10-CM

## 2023-02-14 DIAGNOSIS — I10 Essential (primary) hypertension: Secondary | ICD-10-CM

## 2023-02-14 DIAGNOSIS — E785 Hyperlipidemia, unspecified: Secondary | ICD-10-CM | POA: Diagnosis not present

## 2023-02-14 LAB — POCT GLYCOSYLATED HEMOGLOBIN (HGB A1C): Hemoglobin A1C: 6.4 % — AB (ref 4.0–5.6)

## 2023-02-14 MED ORDER — LOSARTAN POTASSIUM 100 MG PO TABS: 100.0000 mg | ORAL_TABLET | Freq: Every day | ORAL | 1 refills | Status: DC

## 2023-02-14 MED ORDER — EMPAGLIFLOZIN 10 MG PO TABS: 10.0000 mg | ORAL_TABLET | Freq: Every day | ORAL | 0 refills | Status: DC

## 2023-02-14 MED ORDER — SERTRALINE HCL 50 MG PO TABS: 50.0000 mg | ORAL_TABLET | Freq: Every day | ORAL | 3 refills | Status: DC

## 2023-02-14 MED ORDER — FUROSEMIDE 20 MG PO TABS: 20.0000 mg | ORAL_TABLET | ORAL | 3 refills | Status: DC

## 2023-02-14 MED ORDER — METFORMIN HCL ER 500 MG PO TB24: ORAL_TABLET | ORAL | 1 refills | Status: DC

## 2023-02-14 MED ORDER — ATORVASTATIN CALCIUM 40 MG PO TABS: ORAL_TABLET | ORAL | 3 refills | Status: DC

## 2023-02-14 MED ORDER — VERAPAMIL HCL ER 120 MG PO CP24: 120.0000 mg | ORAL_CAPSULE | Freq: Every day | ORAL | 1 refills | Status: DC

## 2023-02-14 MED ORDER — PANTOPRAZOLE SODIUM 40 MG PO TBEC: 40.0000 mg | DELAYED_RELEASE_TABLET | Freq: Two times a day (BID) | ORAL | 3 refills | Status: DC

## 2023-02-14 NOTE — Patient Instructions (Addendum)
WE ARE STOPPING  LOSARTAN HCT AND START TAKING LOSARTAN INSTEAD  BECAUSE YOUR BP HAS IMPROVED   DO NOT START JARDIANCE UNTIL YOU STOP THE LOSARTAN HCT   WHEN YOU START JARDIANCE,   YOU CAN STOP JANUVIA.       HUMPING AND SIDE TO SIDE EXERCISES ARE STRAINING YOUR KNEE LIGAMENTS AND MUSCLES   TAKE A BREAK FROM THE MAGGIE EXERCISES (LEG LIFTING )   DO THE SEATED KNEE EXTENSIONS SLOWLY!

## 2023-02-14 NOTE — Progress Notes (Unsigned)
Subjective:  Patient ID: Sandra Hendrix, female    DOB: 11-Aug-1946  Age: 77 y.o. MRN: [id number]  CC: The primary encounter diagnosis was Type 2 DM with CKD stage 3 and hypertension (HCC). Diagnoses of Essential hypertension and Hyperlipidemia associated with type 2 diabetes mellitus (HCC) were also pertinent to this visit.   HPI Amorah P Oloughlin presents for  Chief Complaint  Patient presents with   Medical Management of Chronic Issues   1) TYPE 2 DM:   MAINTAINED AT 0.5 MG OZEMPIC   AND USING JANUVIA  Lab Results  Component Value Date   HGBA1C 8.3 (H) 08/30/2022      Outpatient Medications Prior to Visit  Medication Sig Dispense Refill   acetaminophen (TYLENOL) 500 MG tablet Take 2 tablets (1,000 mg total) by mouth every 6 (six) hours. (Patient taking differently: Take 1,000 mg by mouth every 6 (six) hours as needed.) 65 tablet 1   albuterol (PROVENTIL HFA;VENTOLIN HFA) 108 (90 BASE) MCG/ACT inhaler Inhale 2 puffs into the lungs every 6 (six) hours as needed for wheezing or shortness of breath.      Ascorbic Acid 500 MG CHEW Chew by mouth.     atorvastatin (LIPITOR) 40 MG tablet TAKE 1/2 TABLET EVERY DAY  AT  6  PM 45 tablet 1   azelastine (ASTELIN) 0.1 % nasal spray Place 1 spray into both nostrils daily.     benzonatate (TESSALON) 100 MG capsule Take 1 capsule (100 mg total) by mouth 3 (three) times daily as needed for cough. 21 capsule 0   Biotin 5000 MCG TABS Take by mouth daily.     blood glucose meter kit and supplies Use to check blood sugar twice daily. ICD 10: E11.29 1 each 0   Cetirizine HCl 10 MG CAPS Take by mouth.     Cinnamon Bark POWD Take by mouth.     COLLAGEN PO Take by mouth daily. For improved fingernail strength     docusate sodium (COLACE) 100 MG capsule Take 1 capsule (100 mg total) by mouth 2 (two) times daily. 20 capsule 0   fluticasone (FLONASE) 50 MCG/ACT nasal spray Place 1 spray into both nostrils daily. 48 g 1   fluticasone (FLOVENT DISKUS) 50  MCG/BLIST diskus inhaler Inhale 1 puff into the lungs 2 (two) times daily.     furosemide (LASIX) 20 MG tablet Take 1 tablet (20 mg total) by mouth 2 (two) times a week. As needed for fluid retention 45 tablet 3   glucose blood (ACCU-CHEK GUIDE) test strip TEST BLOOD SUGAR TWICE DAILY 200 strip 10   Lancet Devices (ACCU-CHEK SOFTCLIX) lancets Use to test blood sugars 1 -2 times daily 1 each 3   loratadine (CLARITIN) 10 MG tablet Take 10 mg by mouth daily.     losartan-hydrochlorothiazide (HYZAAR) 50-12.5 MG tablet TAKE 1 TABLET EVERY DAY 90 tablet 3   Magnesium 250 MG TABS Take 1 tablet by mouth daily.     meclizine (ANTIVERT) 25 MG tablet Take 1 tablet (25 mg total) by mouth 3 (three) times daily as needed for dizziness. 30 tablet 3   metFORMIN (GLUCOPHAGE-XR) 500 MG 24 hr tablet TAKE 1 TABLET EVERY DAY WITH BREAKFAST 30 tablet 0   Multiple Vitamin (MULTIVITAMIN) tablet Take 1 tablet by mouth daily.     nitroGLYCERIN (NITROSTAT) 0.4 MG SL tablet Place 0.4 mg under the tongue.     Nutritional Supplements (NUTRITIONAL SUPPLEMENT PO) Take 1 tablet by mouth. Taking "Useful Brain" supplement daily  pantoprazole (PROTONIX) 40 MG tablet Take 1 tablet by mouth twice daily 180 tablet 0   potassium chloride SA (KLOR-CON M) 20 MEQ tablet TAKE 1 TABLET 2 TIMES A WEEK. 26 tablet 3   Probiotic Product (PROBIOTIC-10) CAPS Take 1 capsule by mouth daily.      Semaglutide,0.25 or 0.5MG /DOS, (OZEMPIC, 0.25 OR 0.5 MG/DOSE,) 2 MG/3ML SOPN INJECT 0.25 MG INTO THE SKIN ONCE A WEEK. 6 mL 3   sertraline (ZOLOFT) 50 MG tablet TAKE 1 TABLET EVERY DAY 90 tablet 3   sitaGLIPtin (JANUVIA) 50 MG tablet Take 1 tablet (50 mg total) by mouth daily. 90 tablet 1   tiZANidine (ZANAFLEX) 4 MG tablet tizanidine 4 mg tablet     verapamil (VERELAN PM) 120 MG 24 hr capsule TAKE 1 CAPSULE EVERY DAY 90 capsule 1   Vibegron (GEMTESA) 75 MG TABS Take 1 tablet (75 mg total) by mouth daily. 30 tablet 11   vitamin B-12 (CYANOCOBALAMIN)  1000 MCG tablet Take 1,000 mcg by mouth daily.     Zinc 50 MG TABS Take by mouth daily.     Omega-3 Fatty Acids (FISH OIL) 1000 MG CAPS 1 g by oral route.     No facility-administered medications prior to visit.    Review of Systems;  Patient denies headache, fevers, malaise, unintentional weight loss, skin rash, eye pain, sinus congestion and sinus pain, sore throat, dysphagia,  hemoptysis , cough, dyspnea, wheezing, chest pain, palpitations, orthopnea, edema, abdominal pain, nausea, melena, diarrhea, constipation, flank pain, dysuria, hematuria, urinary  Frequency, nocturia, numbness, tingling, seizures,  Focal weakness, Loss of consciousness,  Tremor, insomnia, depression, anxiety, and suicidal ideation.      Objective:  BP 102/64   Pulse 76   Temp 98.1 F (36.7 C) (Oral)   Ht 5' (1.524 m)   Wt 140 lb 9.6 oz (63.8 kg)   SpO2 96%   BMI 27.46 kg/m   BP Readings from Last 3 Encounters:  02/14/23 102/64  02/09/23 129/84  01/23/23 123/69    Wt Readings from Last 3 Encounters:  02/14/23 140 lb 9.6 oz (63.8 kg)  02/09/23 141 lb (64 kg)  01/23/23 137 lb (62.1 kg)    Physical Exam  Lab Results  Component Value Date   HGBA1C 8.3 (H) 08/30/2022   HGBA1C 8.0 (H) 05/19/2022   HGBA1C 6.9 (H) 02/14/2022    Lab Results  Component Value Date   CREATININE 0.96 08/30/2022   CREATININE 0.94 05/19/2022   CREATININE 0.93 02/14/2022    Lab Results  Component Value Date   WBC 3.0 (L) 08/30/2022   HGB 13.2 08/30/2022   HCT 39.5 08/30/2022   PLT 301.0 08/30/2022   GLUCOSE 168 (H) 08/30/2022   CHOL 316 (H) 08/30/2022   TRIG 77.0 08/30/2022   HDL 135.30 08/30/2022   LDLDIRECT 146.0 08/30/2022   LDLCALC 166 (H) 08/30/2022   ALT 16 08/30/2022   AST 19 08/30/2022   NA 138 08/30/2022   K 3.5 08/30/2022   CL 100 08/30/2022   CREATININE 0.96 08/30/2022   BUN 26 (H) 08/30/2022   CO2 28 08/30/2022   TSH 3.00 08/30/2022   INR 1.1 01/23/2009   HGBA1C 8.3 (H) 08/30/2022    MICROALBUR 5 01/24/2023    No results found.  Assessment & Plan:  .Type 2 DM with CKD stage 3 and hypertension (HCC)  Essential hypertension  Hyperlipidemia associated with type 2 diabetes mellitus (HCC)     I provided 30 minutes of face-to-face time during this encounter  reviewing patient's last visit with me, patient's  most recent visit with cardiology,  nephrology,  and neurology,  recent surgical and non surgical procedures, previous  labs and imaging studies, counseling on currently addressed issues,  and post visit ordering to diagnostics and therapeutics .   Follow-up: No follow-ups on file.   Sherlene Shams, MD

## 2023-02-15 DIAGNOSIS — M79606 Pain in leg, unspecified: Secondary | ICD-10-CM | POA: Insufficient documentation

## 2023-02-15 LAB — COMPREHENSIVE METABOLIC PANEL
ALT: 20 U/L (ref 0–35)
AST: 24 U/L (ref 0–37)
Albumin: 4.4 g/dL (ref 3.5–5.2)
Alkaline Phosphatase: 110 U/L (ref 39–117)
BUN: 26 mg/dL — ABNORMAL HIGH (ref 6–23)
CO2: 27 mEq/L (ref 19–32)
Calcium: 9.6 mg/dL (ref 8.4–10.5)
Chloride: 99 mEq/L (ref 96–112)
Creatinine, Ser: 0.88 mg/dL (ref 0.40–1.20)
GFR: 62.75 mL/min (ref >60.00)
Glucose, Bld: 94 mg/dL (ref 70–99)
Potassium: 3.6 mEq/L (ref 3.5–5.1)
Sodium: 136 mEq/L (ref 135–145)
Total Bilirubin: 0.4 mg/dL (ref 0.2–1.2)
Total Protein: 7.2 g/dL (ref 6.0–8.3)

## 2023-02-15 LAB — LDL CHOLESTEROL, DIRECT: Direct LDL: 71 mg/dL

## 2023-02-15 LAB — LIPID PANEL
Cholesterol: 215 mg/dL — ABNORMAL HIGH (ref 0–200)
HDL: 117.9 mg/dL (ref >39.00)
LDL Cholesterol: 82 mg/dL (ref 0–99)
NonHDL: 96.72
Total CHOL/HDL Ratio: 2
Triglycerides: 75 mg/dL (ref 0.0–149.0)
VLDL: 15 mg/dL (ref 0.0–40.0)

## 2023-02-15 NOTE — Assessment & Plan Note (Signed)
She has aortic atherosclerosis.  She is tolerating asa, atorvastatin ; LFTs are normal.   Lab Results  Component Value Date   CHOL 215 (H) 02/14/2023   HDL 117.90 02/14/2023   LDLCALC 82 02/14/2023   LDLDIRECT 71.0 02/14/2023   TRIG 75.0 02/14/2023   CHOLHDL 2 02/14/2023   Lab Results  Component Value Date   ALT 20 02/14/2023   AST 24 02/14/2023   ALKPHOS 110 02/14/2023   BILITOT 0.4 02/14/2023

## 2023-02-15 NOTE — Assessment & Plan Note (Signed)
Eliminating hctz from losartan given need to start [facility]

## 2023-02-15 NOTE — Assessment & Plan Note (Signed)
Control restored with resuming ozempic .  Adding jardiance and stopping [place].   Lab Results  Component Value Date   HGBA1C 6.4 (A) 02/14/2023   Lab Results  Component Value Date   LABMICR See below: 01/23/2023   LABMICR See below: 06/20/2022   MICROALBUR 5 01/24/2023   MICROALBUR 1.0 11/15/2021   Lab Results  Component Value Date   CHOL 215 (H) 02/14/2023   HDL 117.90 02/14/2023   LDLCALC 82 02/14/2023   LDLDIRECT 71.0 02/14/2023   TRIG 75.0 02/14/2023   CHOLHDL 2 02/14/2023

## 2023-02-15 NOTE — Assessment & Plan Note (Signed)
Temporally related to jumping and other activities.

## 2023-03-06 ENCOUNTER — Telehealth: Payer: Self-pay | Admitting: Internal Medicine

## 2023-03-06 MED ORDER — METFORMIN HCL ER 500 MG PO TB24: 1000.0000 mg | ORAL_TABLET | Freq: Every day | ORAL | 1 refills | Status: DC

## 2023-03-06 NOTE — Telephone Encounter (Signed)
Prescription Request  03/06/2023  LOV: 02/14/2023  What is the name of the medication or equipment? metFORMIN (GLUCOPHAGE-XR) 500 MG 24 hr tabletP P Patient tried to refill but pharmacy says it is not time. Dr Darrick Huntsman increase her medication to 2x a day. She needs a new prescription. She only has enough medication until Sunday of next week.  Have you contacted your pharmacy to request a refill? Yes   Which pharmacy would you like this sent to?   [facility] - [place], [place] - [address] [place] [address] Phone: [phone] Fax: [phone]    Patient notified that their request is being sent to the clinical staff for review and that they should receive a response within 2 business days.   Please advise at Mobile [phone] (mobile)

## 2023-03-06 NOTE — Telephone Encounter (Signed)
Per office note from January, pt was to increase her metformin to two tablets daily. I have sent a new rx in stating two tablets daily.

## 2023-03-09 ENCOUNTER — Ambulatory Visit (INDEPENDENT_AMBULATORY_CARE_PROVIDER_SITE_OTHER): Payer: Medicare PPO

## 2023-03-09 VITALS — BP 102/64 | Ht 60.0 in | Wt 140.0 lb

## 2023-03-09 DIAGNOSIS — Z0000 Encounter for general adult medical examination without abnormal findings: Secondary | ICD-10-CM | POA: Diagnosis not present

## 2023-03-09 NOTE — Patient Instructions (Signed)
Sandra Hendrix , Thank you for taking time to come for your Medicare Wellness Visit. I appreciate your ongoing commitment to your health goals. Please review the following plan we discussed and let me know if I can assist you in the future.   These are the goals we discussed:  Goals       care coordination activities - no further follow up needed.      Interventions Today    Flowsheet Row Most Recent Value  General Interventions   General Interventions Discussed/Reviewed General Interventions Discussed  [Care coordination program/ services discussed. SDOH survey completed, AWV discussed and patient advised to contact provider office to schedule.  Advised to contact primary provider office if care coordination services needed in the future.]            Maintain healthy lifestyle      Weight goal 145lb-150lb Stay active with balance exercise Increase water intake       Patient Stated (pt-stated)      Patient's goal is to continue working out and maintain her current activity level        This is a list of the screening recommended for you and due dates:  Health Maintenance  Topic Date Due   COVID-19 Vaccine (9 - 2023-24 season) 05/27/2022   Zoster (Shingles) Vaccine (2 of 2) 08/16/2022   Flu Shot  04/27/2023   Eye exam for diabetics  06/04/2023   Hemoglobin A1C  08/17/2023   Mammogram  08/26/2023   DTaP/Tdap/Td vaccine (3 - Td or Tdap) 01/18/2024   Yearly kidney health urinalysis for diabetes  01/24/2024   Yearly kidney function blood test for diabetes  02/14/2024   Complete foot exam   02/14/2024   Medicare Annual Wellness Visit  03/08/2024   Colon Cancer Screening  12/21/2026   Pneumonia Vaccine  Completed   DEXA scan (bone density measurement)  Completed   Hepatitis C Screening  Completed   HPV Vaccine  Aged Out    Advanced directives: Advance directive discussed with you today. Even though you declined this today, please call our office should you change your mind,  and we can give you the proper paperwork for you to fill out. Advance care planning is a way to make decisions about medical care that fits your values in case you are ever unable to make these decisions for yourself.  Information on Advanced Care Planning can be found at [facility] [place] Advance Health Care Directives Advance Health Care Directives ([email or web address]    Conditions/risks identified: Aim for 30 minutes of exercise or brisk walking, 6-8 glasses of water, and 5 servings of fruits and vegetables each day.   Next appointment: Follow up in one year for your annual wellness visit   March 12, 2024 at 8:30AM TELEPHONE VISIT   Preventive Care 66 Years and Older, Female Preventive care refers to lifestyle choices and visits with your health care provider that can promote health and wellness. What does preventive care include? A yearly physical exam. This is also called an annual well check. Dental exams once or twice a year. Routine eye exams. Ask your health care provider how often you should have your eyes checked. Personal lifestyle choices, including: Daily care of your teeth and gums. Regular physical activity. Eating a healthy diet. Avoiding tobacco and drug use. Limiting alcohol use. Practicing safe sex. Taking low-dose aspirin every day. Taking vitamin and mineral supplements as recommended by your health care provider. What happens during an annual  well check? The services and screenings done by your health care provider during your annual well check will depend on your age, overall health, lifestyle risk factors, and family history of disease. Counseling  Your health care provider may ask you questions about your: Alcohol use. Tobacco use. Drug use. Emotional well-being. Home and relationship well-being. Sexual activity. Eating habits. History of falls. Memory and ability to understand (cognition). Work and work Astronomer. Reproductive  health. Screening  You may have the following tests or measurements: Height, weight, and BMI. Blood pressure. Lipid and cholesterol levels. These may be checked every 5 years, or more frequently if you are over 33 years old. Skin check. Lung cancer screening. You may have this screening every year starting at age 3 if you have a 30-pack-year history of smoking and currently smoke or have quit within the past 15 years. Fecal occult blood test (FOBT) of the stool. You may have this test every year starting at age 46. Flexible sigmoidoscopy or colonoscopy. You may have a sigmoidoscopy every 5 years or a colonoscopy every 10 years starting at age 70. Hepatitis C blood test. Hepatitis B blood test. Sexually transmitted disease (STD) testing. Diabetes screening. This is done by checking your blood sugar (glucose) after you have not eaten for a while (fasting). You may have this done every 1-3 years. Bone density scan. This is done to screen for osteoporosis. You may have this done starting at age 68. Mammogram. This may be done every 1-2 years. Talk to your health care provider about how often you should have regular mammograms. Talk with your health care provider about your test results, treatment options, and if necessary, the need for more tests. Vaccines  Your health care provider may recommend certain vaccines, such as: Influenza vaccine. This is recommended every year. Tetanus, diphtheria, and acellular pertussis (Tdap, Td) vaccine. You may need a Td booster every 10 years. Zoster vaccine. You may need this after age 49. Pneumococcal 13-valent conjugate (PCV13) vaccine. One dose is recommended after age 107. Pneumococcal polysaccharide (PPSV23) vaccine. One dose is recommended after age 75. Talk to your health care provider about which screenings and vaccines you need and how often you need them. This information is not intended to replace advice given to you by your health care provider.  Make sure you discuss any questions you have with your health care provider. Document Released: 10/09/2015 Document Revised: 06/01/2016 Document Reviewed: 07/14/2015 Elsevier Interactive Patient Education  2017 [employer].  Fall Prevention in the Home Falls can cause injuries. They can happen to people of all ages. There are many things you can do to make your home safe and to help prevent falls. What can I do on the outside of my home? Regularly fix the edges of walkways and driveways and fix any cracks. Remove anything that might make you trip as you walk through a door, such as a raised step or threshold. Trim any bushes or trees on the path to your home. Use bright outdoor lighting. Clear any walking paths of anything that might make someone trip, such as rocks or tools. Regularly check to see if handrails are loose or broken. Make sure that both Hendrix of any steps have handrails. Any raised decks and porches should have guardrails on the edges. Have any leaves, snow, or ice cleared regularly. Use sand or salt on walking paths during winter. Clean up any spills in your garage right away. This includes oil or grease spills. What can I do  in the bathroom? Use night lights. Install grab bars by the toilet and in the tub and shower. Do not use towel bars as grab bars. Use non-skid mats or decals in the tub or shower. If you need to sit down in the shower, use a plastic, non-slip stool. Keep the floor dry. Clean up any water that spills on the floor as soon as it happens. Remove soap buildup in the tub or shower regularly. Attach bath mats securely with double-sided non-slip rug tape. Do not have throw rugs and other things on the floor that can make you trip. What can I do in the bedroom? Use night lights. Make sure that you have a light by your bed that is easy to reach. Do not use any sheets or blankets that are too big for your bed. They should not hang down onto the floor. Have a  firm chair that has side arms. You can use this for support while you get dressed. Do not have throw rugs and other things on the floor that can make you trip. What can I do in the kitchen? Clean up any spills right away. Avoid walking on wet floors. Keep items that you use a lot in easy-to-reach places. If you need to reach something above you, use a strong step stool that has a grab bar. Keep electrical cords out of the way. Do not use floor polish or wax that makes floors slippery. If you must use wax, use non-skid floor wax. Do not have throw rugs and other things on the floor that can make you trip. What can I do with my stairs? Do not leave any items on the stairs. Make sure that there are handrails on both Hendrix of the stairs and use them. Fix handrails that are broken or loose. Make sure that handrails are as long as the stairways. Check any carpeting to make sure that it is firmly attached to the stairs. Fix any carpet that is loose or worn. Avoid having throw rugs at the top or bottom of the stairs. If you do have throw rugs, attach them to the floor with carpet tape. Make sure that you have a light switch at the top of the stairs and the bottom of the stairs. If you do not have them, ask someone to add them for you. What else can I do to help prevent falls? Wear shoes that: Do not have high heels. Have rubber bottoms. Are comfortable and fit you well. Are closed at the toe. Do not wear sandals. If you use a stepladder: Make sure that it is fully opened. Do not climb a closed stepladder. Make sure that both Hendrix of the stepladder are locked into place. Ask someone to hold it for you, if possible. Clearly mark and make sure that you can see: Any grab bars or handrails. First and last steps. Where the edge of each step is. Use tools that help you move around (mobility aids) if they are needed. These include: Canes. Walkers. Scooters. Crutches. Turn on the lights when you  go into a dark area. Replace any light bulbs as soon as they burn out. Set up your furniture so you have a clear path. Avoid moving your furniture around. If any of your floors are uneven, fix them. If there are any pets around you, be aware of where they are. Review your medicines with your doctor. Some medicines can make you feel dizzy. This can increase your chance of falling. Ask your  doctor what other things that you can do to help prevent falls. This information is not intended to replace advice given to you by your health care provider. Make sure you discuss any questions you have with your health care provider. Document Released: 07/09/2009 Document Revised: 02/18/2016 Document Reviewed: 10/17/2014 Elsevier Interactive Patient Education  2017 [employer].

## 2023-03-09 NOTE — Progress Notes (Addendum)
I connected with  Sandra Hendrix on 03/09/23 by a audio enabled telemedicine application and verified that I am speaking with the correct person using two identifiers.  Patient Location: Home  Provider Location: Home Office  I discussed the limitations of evaluation and management by telemedicine. The patient expressed understanding and agreed to proceed.  Patient Medicare AWV questionnaire was completed by the patient on 03/09/2023; I have confirmed that all information answered by patient is correct and no changes since this date.    Due to this visit being a telehealth visit, certain criteria was not obtained, such a blood pressure, CBG if patient is a diabetic, and timed up and go.   Subjective:   Sandra Hendrix is a 77 y.o. female who presents for Medicare Annual (Subsequent) preventive examination.  Review of Systems     Cardiac Risk Factors include: advanced age (>55men, >64 women);diabetes mellitus;dyslipidemia;hypertension     Objective:    Today's Vitals   03/09/23 0828  BP: 102/64  Weight: 140 lb (63.5 kg)  Height: 5' (1.524 m)   Body mass index is 27.34 kg/m.     03/09/2023    8:50 AM 10/05/2022   10:20 AM 12/17/2021    8:42 AM 09/29/2021    9:39 AM 12/18/2020   10:22 AM 12/08/2020    3:18 PM 11/23/2020   12:47 PM  Advanced Directives  Does Patient Have a Medical Advance Directive? No No Yes No Yes Yes Yes  Type of Surveyor, minerals;Living will  Living will Healthcare Power of [place];Living will Healthcare Power of [place];Living will  Does patient want to make changes to medical advance directive?       No - Patient declined  Copy of Healthcare Power of Attorney in Chart?   Yes - validated most recent copy scanned in chart (See row information)    No - copy requested  Would patient like information on creating a medical advance directive? No - Patient declined No - Patient declined  No - Patient declined       Current  Medications (verified) Outpatient Encounter Medications as of 03/09/2023  Medication Sig   acetaminophen (TYLENOL) 500 MG tablet Take 2 tablets (1,000 mg total) by mouth every 6 (six) hours. (Patient taking differently: Take 1,000 mg by mouth every 6 (six) hours as needed.)   albuterol (PROVENTIL HFA;VENTOLIN HFA) 108 (90 BASE) MCG/ACT inhaler Inhale 2 puffs into the lungs every 6 (six) hours as needed for wheezing or shortness of breath.    Ascorbic Acid 500 MG CHEW Chew by mouth.   atorvastatin (LIPITOR) 40 MG tablet TAKE 1/2 TABLET EVERY DAY  AT  6  PM   azelastine (ASTELIN) 0.1 % nasal spray Place 1 spray into both nostrils daily.   Biotin 5000 MCG TABS Take by mouth daily.   blood glucose meter kit and supplies Use to check blood sugar twice daily. ICD 10: E11.29   cephALEXin (KEFLEX) 500 MG capsule Take 1,000 mg by mouth as directed. Take 2 capsules (1000mg ) by mouth two hours BEFORE any dental procedure and take 1000mg  by mouth SIX HOURS AFTER any dental procedure   Cetirizine HCl 10 MG CAPS Take by mouth.   Cinnamon Bark POWD Take by mouth.   COLLAGEN PO Take by mouth daily. For improved fingernail strength   docusate sodium (COLACE) 100 MG capsule Take 1 capsule (100 mg total) by mouth 2 (two) times daily.   empagliflozin (JARDIANCE) 10 MG TABS  tablet Take 1 tablet (10 mg total) by mouth daily before breakfast.   fluticasone (FLONASE) 50 MCG/ACT nasal spray Place 1 spray into both nostrils daily.   fluticasone (FLOVENT DISKUS) 50 MCG/BLIST diskus inhaler Inhale 1 puff into the lungs 2 (two) times daily.   furosemide (LASIX) 20 MG tablet Take 1 tablet (20 mg total) by mouth 2 (two) times a week. As needed for fluid retention   glucose blood (ACCU-CHEK GUIDE) test strip TEST BLOOD SUGAR TWICE DAILY   Lancet Devices (ACCU-CHEK SOFTCLIX) lancets Use to test blood sugars 1 -2 times daily   losartan (COZAAR) 100 MG tablet Take 1 tablet (100 mg total) by mouth daily.   Magnesium 250 MG TABS  Take 1 tablet by mouth daily.   meclizine (ANTIVERT) 25 MG tablet Take 1 tablet (25 mg total) by mouth 3 (three) times daily as needed for dizziness.   metFORMIN (GLUCOPHAGE-XR) 500 MG 24 hr tablet Take 2 tablets (1,000 mg total) by mouth daily with breakfast.   Multiple Vitamin (MULTIVITAMIN) tablet Take 1 tablet by mouth daily.   nitroGLYCERIN (NITROSTAT) 0.4 MG SL tablet Place 0.4 mg under the tongue.   pantoprazole (PROTONIX) 40 MG tablet Take 1 tablet (40 mg total) by mouth 2 (two) times daily.   potassium chloride SA (KLOR-CON M) 20 MEQ tablet TAKE 1 TABLET 2 TIMES A WEEK.   Probiotic Product (PROBIOTIC-10) CAPS Take 1 capsule by mouth daily.    Semaglutide,0.25 or 0.5MG /DOS, (OZEMPIC, 0.25 OR 0.5 MG/DOSE,) 2 MG/3ML SOPN INJECT 0.25 MG INTO THE SKIN ONCE A WEEK.   sertraline (ZOLOFT) 50 MG tablet Take 1 tablet (50 mg total) by mouth daily.   verapamil (VERELAN) 120 MG 24 hr capsule Take 1 capsule (120 mg total) by mouth daily.   Vibegron (GEMTESA) 75 MG TABS Take 1 tablet (75 mg total) by mouth daily.   vitamin B-12 (CYANOCOBALAMIN) 1000 MCG tablet Take 1,000 mcg by mouth daily.   Zinc 50 MG TABS Take by mouth daily.   benzonatate (TESSALON) 100 MG capsule Take 1 capsule (100 mg total) by mouth 3 (three) times daily as needed for cough. (Patient not taking: Reported on 03/09/2023)   loratadine (CLARITIN) 10 MG tablet Take 10 mg by mouth daily.   Nutritional Supplements (NUTRITIONAL SUPPLEMENT PO) Take 1 tablet by mouth. Taking "Useful Brain" supplement daily (Patient not taking: Reported on 03/09/2023)   tiZANidine (ZANAFLEX) 4 MG tablet tizanidine 4 mg tablet (Patient not taking: Reported on 03/09/2023)   [DISCONTINUED] sitaGLIPtin (JANUVIA) 50 MG tablet Take 1 tablet by mouth daily. (Patient not taking: Reported on 03/09/2023)   No facility-administered encounter medications on file as of 03/09/2023.    Allergies (verified) Clindamycin/lincomycin, Codeine, Lincomycin, Morphine and codeine,  Talwin [pentazocine], Clindamycin, Morphine, Oxycodone, Lisinopril, Naloxone, Oxycodone-acetaminophen, and Warfarin and related   History: Past Medical History:  Diagnosis Date   Allergy    Anxiety    Arthritis    Barrett's esophagus determined by endoscopy    Cancer (HCC)    Uterine Cancer   Chronic kidney disease    Chronic venous insufficiency    Cough variant asthma 02/07/2022   COVID-19 virus infection 07/21/2021   NOTIFIED ON OCT 26 . FEVERS BODY ACHES,  HEADACHES.  SENDING IN [facility]   Depression    Diabetes mellitus without complication (HCC)    Difficult intubation    "not sure what happened,asleep was told had problem with intubation"   Diverticulosis    Fatty liver    GERD (gastroesophageal  reflux disease)    History of bronchitis    History of hiatal hernia    Hyperlipidemia    Hypertension    Irritable bowel syndrome with constipation    Sleep apnea    Use C- PAP   Urinary incontinence    Varicose veins    Past Surgical History:  Procedure Laterality Date   CARDIAC CATHETERIZATION     CHOLECYSTECTOMY     COLONOSCOPY WITH PROPOFOL N/A 06/14/2016   Procedure: COLONOSCOPY WITH PROPOFOL;  Surgeon: Christena Deem, MD;  Location: [facility] ENDOSCOPY;  Service: Endoscopy;  Laterality: N/A;   COLONOSCOPY WITH PROPOFOL N/A 12/18/2020   Procedure: COLONOSCOPY WITH PROPOFOL;  Surgeon: Regis Bill, MD;  Location: [facility] ENDOSCOPY;  Service: Endoscopy;  Laterality: N/A;   COLONOSCOPY WITH PROPOFOL N/A 12/17/2021   Procedure: COLONOSCOPY WITH PROPOFOL;  Surgeon: Jaynie Collins, DO;  Location: [facility] ENDOSCOPY;  Service: Gastroenterology;  Laterality: N/A;  DM   ESOPHAGOGASTRODUODENOSCOPY N/A 12/18/2020   Procedure: ESOPHAGOGASTRODUODENOSCOPY (EGD);  Surgeon: Regis Bill, MD;  Location: [facility] ENDOSCOPY;  Service: Endoscopy;  Laterality: N/A;   ESOPHAGOGASTRODUODENOSCOPY (EGD) WITH PROPOFOL N/A 06/14/2016   Procedure: ESOPHAGOGASTRODUODENOSCOPY (EGD) WITH  PROPOFOL;  Surgeon: Christena Deem, MD;  Location: [facility] ENDOSCOPY;  Service: Endoscopy;  Laterality: N/A;   ESOPHAGOGASTRODUODENOSCOPY (EGD) WITH PROPOFOL N/A 06/12/2017   Procedure: ESOPHAGOGASTRODUODENOSCOPY (EGD) WITH PROPOFOL;  Surgeon: Christena Deem, MD;  Location: [facility] ENDOSCOPY;  Service: Endoscopy;  Laterality: N/A;   ESOPHAGOGASTRODUODENOSCOPY (EGD) WITH PROPOFOL N/A 08/25/2017   Procedure: ESOPHAGOGASTRODUODENOSCOPY (EGD) WITH PROPOFOL;  Surgeon: Christena Deem, MD;  Location: [facility] ENDOSCOPY;  Service: Endoscopy;  Laterality: N/A;   HYSTEROSCOPY WITH D & C N/A 01/02/2017   Procedure: DILATATION AND CURETTAGE /HYSTEROSCOPY;  Surgeon: Herold Harms, MD;  Location: [facility] ORS;  Service: Gynecology;  Laterality: N/A;   JOINT REPLACEMENT Right 2007   Total Knee Replacement   JOINT REPLACEMENT Left 2004   Total Knee Replacement   LAPAROSCOPIC HYSTERECTOMY Bilateral 03/01/2017   Procedure: HYSTERECTOMY TOTAL LAPAROSCOPIC BSO;  Surgeon: Leida Lauth, MD;  Location: [facility] ORS;  Service: Gynecology;  Laterality: Bilateral;   ROTATOR CUFF REPAIR Right    SENTINEL NODE BIOPSY N/A 03/01/2017   Procedure: SENTINEL NODE INJECTION AND BIOPSY;  Surgeon: Leida Lauth, MD;  Location: [facility] ORS;  Service: Gynecology;  Laterality: N/A;   TONSILLECTOMY     as a child   TUBAL LIGATION     UPPER GI ENDOSCOPY     x2   Family History  Problem Relation Age of Onset   Stroke Mother    Hypertension Mother    Lupus Mother    Alcohol abuse Father    Diabetes Father    Cancer Father        gallbladder ca into liver    Arthritis Sister    Diabetes Sister    Lumbar disc disease Sister    Hyperlipidemia Daughter    Hypertension Daughter    Cancer Maternal Aunt        ovarian ca   Diabetes Paternal Aunt    Drug abuse Paternal Aunt    Cancer Maternal Grandfather        colon ca   Hypertension Brother    Alcohol abuse Brother    Stroke Brother    Social History   Socioeconomic  History   Marital status: Married    Spouse name: Not on file   Number of children: 2   Years of education: 14   Highest education  level: Some college, no degree  Occupational History   Not on file  Tobacco Use   Smoking status: Never    Passive exposure: Never   Smokeless tobacco: Never  Vaping Use   Vaping Use: Never used  Substance and Sexual Activity   Alcohol use: Not Currently    Comment: rare   Drug use: No   Sexual activity: Yes    Comment: not often  Other Topics Concern   Not on file  Social History Narrative   Not on file   Social Determinants of Health   Financial Resource Strain: Low Risk  (03/09/2023)   Overall Financial Resource Strain (CARDIA)    Difficulty of Paying Living Expenses: Not hard at all  Food Insecurity: No Food Insecurity (03/09/2023)   Hunger Vital Sign    Worried About Running Out of Food in the Last Year: Never true    Ran Out of Food in the Last Year: Never true  Transportation Needs: No Transportation Needs (03/09/2023)   PRAPARE - Administrator, Civil Service (Medical): No    Lack of Transportation (Non-Medical): No  Physical Activity: Sufficiently Active (03/09/2023)   Exercise Vital Sign    Days of Exercise per Week: 3 days    Minutes of Exercise per Session: 60 min  Stress: No Stress Concern Present (03/09/2023)   [employer] of Occupational Health - Occupational Stress Questionnaire    Feeling of Stress : Not at all  Social Connections: Socially Integrated (03/09/2023)   Social Connection and Isolation Panel [NHANES]    Frequency of Communication with Friends and Family: Three times a week    Frequency of Social Gatherings with Friends and Family: Three times a week    Attends Religious Services: More than 4 times per year    Active Member of Clubs or Organizations: Yes    Attends Engineer, structural: More than 4 times per year    Marital Status: Married    Tobacco Counseling Counseling given:  Yes   Clinical Intake:  Pre-visit preparation completed: Yes  Pain : No/denies pain     BMI - recorded: 27.34 Nutritional Status: BMI 25 -29 Overweight Nutritional Risks: None Diabetes: Yes CBG done?: No (telehealth visit) Did pt. bring in CBG monitor from home?: No  How often do you need to have someone help you when you read instructions, pamphlets, or other written materials from your doctor or pharmacy?: 1 - Never  Diabetic?  Nutrition Risk Assessment:  Has the patient had any N/V/D within the last 2 months?  No  Does the patient have any non-healing wounds?  No  Has the patient had any unintentional weight loss or weight gain?  No   Diabetes:  Is the patient diabetic?  Yes  If diabetic, was a CBG obtained today?  No  Did the patient bring in their glucometer from home?  No  How often do you monitor your CBG's? Twice daily.   Financial Strains and Diabetes Management:  Are you having any financial strains with the device, your supplies or your medication? No .  Does the patient want to be seen by Chronic Care Management for management of their diabetes?  No  Would the patient like to be referred to a Nutritionist or for Diabetic Management?  No   Diabetic Exams:  Diabetic Eye Exam: Completed 06/03/2022 Diabetic Foot Exam: Completed 02/14/2023   Interpreter Needed?: No  Information entered by :: Abby Donnivan Villena, CMA   Activities of Daily  Living    03/09/2023    8:52 AM 03/09/2023    1:54 AM  In your present state of health, do you have any difficulty performing the following activities:  Hearing? 1 1  Vision? 1 1  Difficulty concentrating or making decisions? 1 1  Walking or climbing stairs? 0 0  Dressing or bathing? 0 0  Doing errands, shopping? 0 0  Preparing Food and eating ? N N  Using the Toilet? N N  In the past six months, have you accidently leaked urine? Y Y  Do you have problems with loss of bowel control? N N  Managing your Medications? N N   Managing your Finances? N N  Housekeeping or managing your Housekeeping? N N    Patient Care Team: Sherlene Shams, MD as PCP - General (Internal Medicine) Benita Gutter, RN as Registered Nurse  Indicate any recent Medical Services you may have received from other than [facility] providers in the past year (date may be approximate).     Assessment:   This is a routine wellness examination for Lenor.  Hearing/Vision screen Hearing Screening - Comments:: Patient wears hearing aids  Vision Screening - Comments:: Wears rx glasses - up to date with routine eye exams with  [facility]  Dietary issues and exercise activities discussed: Current Exercise Habits: Home exercise routine, Type of exercise: walking, Time (Minutes): 60, Frequency (Times/Week): 3, Weekly Exercise (Minutes/Week): 180, Intensity: Mild, Exercise limited by: None identified   Goals Addressed               This Visit's Progress     Patient Stated (pt-stated)        Patient's goal is to continue working out and maintain her current activity level       Depression Screen    03/09/2023    8:46 AM 02/14/2023    3:03 PM 09/28/2022   11:47 AM 06/28/2022    1:12 PM 05/19/2022   10:11 AM 03/23/2022    9:35 AM 03/09/2022    9:23 AM  PHQ 2/9 Scores  PHQ - 2 Score 0 0 0 0 0 0 0    Fall Risk    03/09/2023    8:50 AM 03/09/2023    1:54 AM 02/14/2023    3:03 PM 09/28/2022   11:47 AM 06/28/2022    1:12 PM  Fall Risk   Falls in the past year? 1 1 1 1 1   Number falls in past yr: 0 0 0 0 0  Injury with Fall? 0 0 0 0 0  Risk for fall due to : No Fall Risks  History of fall(s) No Fall Risks History of fall(s)  Follow up Falls prevention discussed  Falls evaluation completed Falls evaluation completed Falls evaluation completed    FALL RISK PREVENTION PERTAINING TO THE HOME:  Any stairs in or around the home? No  If so, are there any without handrails? No  Home free of loose throw rugs in walkways, pet beds,  electrical cords, etc? Yes  Adequate lighting in your home to reduce risk of falls? Yes   ASSISTIVE DEVICES UTILIZED TO PREVENT FALLS:  Life alert? No  Use of a cane, walker or w/c? No  Grab bars in the bathroom? No  Shower chair or bench in shower? No  Elevated toilet seat or a handicapped toilet? No   TIMED UP AND GO:  Was the test performed? No .   Cognitive Function:    12/12/2017  11:07 AM 11/28/2016   10:48 AM 10/26/2015    1:42 PM  MMSE - Mini Mental State Exam  Orientation to time 5 5 5   Orientation to Place 5 5 5   Registration 3 3 3   Attention/ Calculation 5 5 5   Recall 3 3 3   Language- name 2 objects 2 2 2   Language- repeat 1 1 1   Language- follow 3 step command 3 3 3   Language- read & follow direction 1 1 1   Write a sentence 1 1 1   Copy design 1 1 1   Total score 30 30 30         03/09/2023    8:53 AM 12/28/2021    9:03 AM 11/21/2019   12:55 PM  6CIT Screen  What Year? 0 points 0 points 0 points  What month? 0 points 0 points 0 points  What time? 0 points 0 points 0 points  Count back from 20 0 points 0 points 0 points  Months in reverse 4 points 0 points 0 points  Repeat phrase 4 points 0 points 0 points  Total Score 8 points 0 points 0 points    Immunizations Immunization History  Administered Date(s) Administered   Fluad Quad(high Dose 65+) 06/08/2019   Hep A / Hep B 08/20/2019, 08/20/2019, 09/17/2019   Influenza Split 05/21/2013, 06/06/2014, 05/28/2017   Influenza, High Dose Seasonal PF 05/29/2018   Influenza,inj,Quad PF,6+ Mos 06/05/2015, 06/09/2017   Influenza-Unspecified 06/19/2012, 06/14/2016, 04/26/2018, 07/10/2020, 07/13/2021, 06/21/2022   Moderna Covid-19 Vaccine Bivalent Booster 42yrs & up 07/13/2021   Moderna Sars-Covid-2 Vaccination 10/28/2019, 11/25/2019, 05/15/2020, 01/19/2021   PFIZER(Purple Top)SARS-COV-2 Vaccination 10/28/2019, 11/25/2019, 05/15/2020   Pneumococcal Conjugate-13 10/21/2013   Pneumococcal Polysaccharide-23 07/15/2004,  05/08/2010, 09/25/2015, 01/19/2021, 10/12/2021   Td 11/22/2003   Tdap 01/17/2014   Zoster Recombinat (Shingrix) 06/21/2022   Zoster, Live 01/17/2014    TDAP status: Up to date  Flu Vaccine status: Up to date  Pneumococcal vaccine status: Up to date  Covid-19 vaccine status: Information provided on how to obtain vaccines.   Qualifies for Shingles Vaccine? Yes   Zostavax completed No   Shingrix Completed?: No.    Education has been provided regarding the importance of this vaccine. Patient has been advised to call insurance company to determine out of pocket expense if they have not yet received this vaccine. Advised may also receive vaccine at local pharmacy or Health Dept. Verbalized acceptance and understanding.  Screening Tests Health Maintenance  Topic Date Due   COVID-19 Vaccine (9 - 2023-24 season) 05/27/2022   Zoster Vaccines- Shingrix (2 of 2) 08/16/2022   INFLUENZA VACCINE  04/27/2023   OPHTHALMOLOGY EXAM  06/04/2023   HEMOGLOBIN A1C  08/17/2023   MAMMOGRAM  08/26/2023   DTaP/Tdap/Td (3 - Td or Tdap) 01/18/2024   Diabetic kidney evaluation - Urine ACR  01/24/2024   Diabetic kidney evaluation - eGFR measurement  02/14/2024   FOOT EXAM  02/14/2024   Medicare Annual Wellness (AWV)  03/08/2024   Colonoscopy  12/21/2026   Pneumonia Vaccine 51+ Years old  Completed   DEXA SCAN  Completed   Hepatitis C Screening  Completed   HPV VACCINES  Aged Out    Health Maintenance  Health Maintenance Due  Topic Date Due   COVID-19 Vaccine (9 - 2023-24 season) 05/27/2022   Zoster Vaccines- Shingrix (2 of 2) 08/16/2022    Colorectal cancer screening: Type of screening: Colonoscopy. Completed 12/20/2021. Repeat every 5 years  Mammogram status: Completed 08/25/2022. Repeat every year  Bone Density status:  Completed 10/18/2022. Results reflect: Bone density results: OSTEOPENIA. Repeat every 2 years.  Lung Cancer Screening: (Low Dose CT Chest recommended if Age 48-80 years, 30  pack-year currently smoking OR have quit w/in 15years.) does not qualify.      Additional Screening:  Hepatitis C Screening: does not qualify; Completed 12/28/2015  Vision Screening: Recommended annual ophthalmology exams for early detection of glaucoma and other disorders of the eye. Is the patient up to date with their annual eye exam?  Yes  Who is the provider or what is the name of the office in which the patient attends annual eye exams? [facility] If pt is not established with a provider, would they like to be referred to a provider to establish care? No .   Dental Screening: Recommended annual dental exams for proper oral hygiene  Community Resource Referral / Chronic Care Management: CRR required this visit?  No   CCM required this visit?  No      Plan:     I have personally reviewed and noted the following in the patient's chart:   Medical and social history Use of alcohol, tobacco or illicit drugs  Current medications and supplements including opioid prescriptions. Patient is not currently taking opioid prescriptions. Functional ability and status Nutritional status Physical activity Advanced directives List of other physicians Hospitalizations, surgeries, and ER visits in previous 12 months Vitals Screenings to include cognitive, depression, and falls Referrals and appointments  In addition, I have reviewed and discussed with patient certain preventive protocols, quality metrics, and best practice recommendations. A written personalized care plan for preventive services as well as general preventive health recommendations were provided to patient.   Due to this being a telephonic visit, the after visit summary with patients personalized plan was offered to patient via mail or my-chart. Patient would like to access their AVS via my-chart     Jordan Hawks Burk Hoctor, CMA   03/09/2023   Nurse Notes: Health Maintenance is utd.      I have reviewed the above  information and agree with above.   Duncan Dull, MD

## 2023-03-10 ENCOUNTER — Other Ambulatory Visit: Payer: Self-pay | Admitting: Internal Medicine

## 2023-03-17 MED ORDER — METFORMIN HCL ER 500 MG PO TB24: 1000.0000 mg | ORAL_TABLET | Freq: Every day | ORAL | 0 refills | Status: DC

## 2023-03-17 NOTE — Addendum Note (Signed)
Addended by: Birdie Sons, Teruko Joswick G on: 03/17/2023 11:04 AM   Modules accepted: Orders

## 2023-03-17 NOTE — Telephone Encounter (Signed)
Patient called about her metFORMIN (GLUCOPHAGE-XR) 500 MG 24 hr tablet. Her pharmacy did not give her enough medication, because Dr Darrick Huntsman increased. Pharmacy stated they never go the prescription for increase. Patient is out medication. [facility] pharmacy.

## 2023-03-17 NOTE — Telephone Encounter (Signed)
Sent to mail order pharmacy as requested

## 2023-04-09 ENCOUNTER — Other Ambulatory Visit: Payer: Self-pay | Admitting: Internal Medicine

## 2023-04-28 DIAGNOSIS — H18419 Arcus senilis, unspecified eye: Secondary | ICD-10-CM | POA: Diagnosis not present

## 2023-04-28 DIAGNOSIS — I701 Atherosclerosis of renal artery: Secondary | ICD-10-CM | POA: Diagnosis not present

## 2023-04-28 DIAGNOSIS — I700 Atherosclerosis of aorta: Secondary | ICD-10-CM | POA: Diagnosis not present

## 2023-04-28 DIAGNOSIS — K760 Fatty (change of) liver, not elsewhere classified: Secondary | ICD-10-CM | POA: Diagnosis not present

## 2023-04-28 DIAGNOSIS — M810 Age-related osteoporosis without current pathological fracture: Secondary | ICD-10-CM | POA: Diagnosis not present

## 2023-04-28 DIAGNOSIS — Q399 Congenital malformation of esophagus, unspecified: Secondary | ICD-10-CM | POA: Diagnosis not present

## 2023-04-28 DIAGNOSIS — Z7985 Long-term (current) use of injectable non-insulin antidiabetic drugs: Secondary | ICD-10-CM | POA: Diagnosis not present

## 2023-04-28 DIAGNOSIS — J300 Vasomotor rhinitis: Secondary | ICD-10-CM | POA: Diagnosis not present

## 2023-04-28 DIAGNOSIS — E785 Hyperlipidemia, unspecified: Secondary | ICD-10-CM | POA: Diagnosis not present

## 2023-04-28 DIAGNOSIS — E88819 Insulin resistance, unspecified: Secondary | ICD-10-CM | POA: Diagnosis not present

## 2023-04-28 DIAGNOSIS — E1151 Type 2 diabetes mellitus with diabetic peripheral angiopathy without gangrene: Secondary | ICD-10-CM | POA: Diagnosis not present

## 2023-04-28 DIAGNOSIS — H9193 Unspecified hearing loss, bilateral: Secondary | ICD-10-CM | POA: Diagnosis not present

## 2023-04-28 DIAGNOSIS — M1990 Unspecified osteoarthritis, unspecified site: Secondary | ICD-10-CM | POA: Diagnosis not present

## 2023-04-28 DIAGNOSIS — I2510 Atherosclerotic heart disease of native coronary artery without angina pectoris: Secondary | ICD-10-CM | POA: Diagnosis not present

## 2023-04-28 DIAGNOSIS — K5900 Constipation, unspecified: Secondary | ICD-10-CM | POA: Diagnosis not present

## 2023-04-28 DIAGNOSIS — K219 Gastro-esophageal reflux disease without esophagitis: Secondary | ICD-10-CM | POA: Diagnosis not present

## 2023-04-28 DIAGNOSIS — F419 Anxiety disorder, unspecified: Secondary | ICD-10-CM | POA: Diagnosis not present

## 2023-04-28 DIAGNOSIS — G4733 Obstructive sleep apnea (adult) (pediatric): Secondary | ICD-10-CM | POA: Diagnosis not present

## 2023-04-28 DIAGNOSIS — F320 Major depressive disorder, single episode, mild: Secondary | ICD-10-CM | POA: Diagnosis not present

## 2023-06-13 DIAGNOSIS — H524 Presbyopia: Secondary | ICD-10-CM | POA: Diagnosis not present

## 2023-06-13 DIAGNOSIS — Z961 Presence of intraocular lens: Secondary | ICD-10-CM | POA: Diagnosis not present

## 2023-06-13 DIAGNOSIS — Z7984 Long term (current) use of oral hypoglycemic drugs: Secondary | ICD-10-CM | POA: Diagnosis not present

## 2023-06-13 DIAGNOSIS — H5213 Myopia, bilateral: Secondary | ICD-10-CM | POA: Diagnosis not present

## 2023-06-13 DIAGNOSIS — H52223 Regular astigmatism, bilateral: Secondary | ICD-10-CM | POA: Diagnosis not present

## 2023-06-13 DIAGNOSIS — H04123 Dry eye syndrome of bilateral lacrimal glands: Secondary | ICD-10-CM | POA: Diagnosis not present

## 2023-06-13 DIAGNOSIS — E119 Type 2 diabetes mellitus without complications: Secondary | ICD-10-CM | POA: Diagnosis not present

## 2023-06-13 DIAGNOSIS — H26493 Other secondary cataract, bilateral: Secondary | ICD-10-CM | POA: Diagnosis not present

## 2023-06-13 LAB — HM DIABETES EYE EXAM

## 2023-06-19 ENCOUNTER — Ambulatory Visit: Payer: Medicare PPO | Admitting: Urology

## 2023-06-19 DIAGNOSIS — E1122 Type 2 diabetes mellitus with diabetic chronic kidney disease: Secondary | ICD-10-CM | POA: Diagnosis not present

## 2023-06-19 DIAGNOSIS — N182 Chronic kidney disease, stage 2 (mild): Secondary | ICD-10-CM | POA: Diagnosis not present

## 2023-06-19 DIAGNOSIS — I10 Essential (primary) hypertension: Secondary | ICD-10-CM | POA: Diagnosis not present

## 2023-06-19 DIAGNOSIS — R600 Localized edema: Secondary | ICD-10-CM | POA: Diagnosis not present

## 2023-07-16 ENCOUNTER — Other Ambulatory Visit: Payer: Self-pay | Admitting: Family Medicine

## 2023-07-17 ENCOUNTER — Ambulatory Visit: Payer: Medicare PPO | Admitting: Urology

## 2023-07-17 ENCOUNTER — Encounter: Payer: Self-pay | Admitting: Urology

## 2023-07-17 DIAGNOSIS — N3946 Mixed incontinence: Secondary | ICD-10-CM

## 2023-07-17 LAB — URINALYSIS, COMPLETE
Bilirubin, UA: NEGATIVE
Ketones, UA: NEGATIVE
Leukocytes,UA: NEGATIVE
Nitrite, UA: NEGATIVE
Protein,UA: NEGATIVE
RBC, UA: NEGATIVE
Specific Gravity, UA: 1.02 (ref 1.005–1.030)
Urobilinogen, Ur: 0.2 mg/dL (ref 0.2–1.0)
pH, UA: 6 (ref 5.0–7.5)

## 2023-07-17 LAB — MICROSCOPIC EXAMINATION: Bacteria, UA: NONE SEEN

## 2023-07-17 NOTE — Progress Notes (Signed)
07/17/2023 11:16 AM   Sandra Hendrix Jan 18, 1946 [id number]  Referring provider: Sherlene Shams, MD [address] [place],  [place] [address]  Chief Complaint  Patient presents with   Follow-up    1 year follow-up    HPI: Reviewed long note. Patient was doing very well 1 year ago on oxybutynin. Doing very well on oxybutynin. No infections      Today I reviewed my last lengthy note.  Her response to overactive bladder medication in the past demonstrates she primarily has an overactive bladder.   Patient is starting to have little bit more from the floor syndrome and urge incontinence during the day.  Clinically not infected.  Has tried trospium Vesicare and Myrbetriq.     thought was reasonable to try Gemtesa. Call if culture positive. Discussed 3 refractory treatments next visit or go back on oxybutynin perhaps a higher dose and/or see physical therapy    Today Urgent continence dramatically better on Gemtesa.  Frequency much better.  Very happy.  To $80    Today Patient has very good control on the [place].  Frequency improved.  No infections.   PMH: Past Medical History:  Diagnosis Date   Allergy    Anxiety    Arthritis    Barrett's esophagus determined by endoscopy    Cancer (HCC)    Uterine Cancer   Chronic kidney disease    Chronic venous insufficiency    Cough variant asthma 02/07/2022   COVID-19 virus infection 07/21/2021   NOTIFIED ON OCT 26 . FEVERS BODY ACHES,  HEADACHES.  SENDING IN MOLNUPIRAVIR   Depression    Diabetes mellitus without complication (HCC)    Difficult intubation    "not sure what happened,asleep was told had problem with intubation"   Diverticulosis    Fatty liver    GERD (gastroesophageal reflux disease)    History of bronchitis    History of hiatal hernia    Hyperlipidemia    Hypertension    Irritable bowel syndrome with constipation    Sleep apnea    Use C- PAP   Urinary incontinence    Varicose veins      Surgical History: Past Surgical History:  Procedure Laterality Date   CARDIAC CATHETERIZATION     CHOLECYSTECTOMY     COLONOSCOPY WITH PROPOFOL N/A 06/14/2016   Procedure: COLONOSCOPY WITH PROPOFOL;  Surgeon: Christena Deem, MD;  Location: [facility] ENDOSCOPY;  Service: Endoscopy;  Laterality: N/A;   COLONOSCOPY WITH PROPOFOL N/A 12/18/2020   Procedure: COLONOSCOPY WITH PROPOFOL;  Surgeon: Regis Bill, MD;  Location: [facility] ENDOSCOPY;  Service: Endoscopy;  Laterality: N/A;   COLONOSCOPY WITH PROPOFOL N/A 12/17/2021   Procedure: COLONOSCOPY WITH PROPOFOL;  Surgeon: Jaynie Collins, DO;  Location: [facility] ENDOSCOPY;  Service: Gastroenterology;  Laterality: N/A;  DM   ESOPHAGOGASTRODUODENOSCOPY N/A 12/18/2020   Procedure: ESOPHAGOGASTRODUODENOSCOPY (EGD);  Surgeon: Regis Bill, MD;  Location: [facility] ENDOSCOPY;  Service: Endoscopy;  Laterality: N/A;   ESOPHAGOGASTRODUODENOSCOPY (EGD) WITH PROPOFOL N/A 06/14/2016   Procedure: ESOPHAGOGASTRODUODENOSCOPY (EGD) WITH PROPOFOL;  Surgeon: Christena Deem, MD;  Location: [facility] ENDOSCOPY;  Service: Endoscopy;  Laterality: N/A;   ESOPHAGOGASTRODUODENOSCOPY (EGD) WITH PROPOFOL N/A 06/12/2017   Procedure: ESOPHAGOGASTRODUODENOSCOPY (EGD) WITH PROPOFOL;  Surgeon: Christena Deem, MD;  Location: [facility] ENDOSCOPY;  Service: Endoscopy;  Laterality: N/A;   ESOPHAGOGASTRODUODENOSCOPY (EGD) WITH PROPOFOL N/A 08/25/2017   Procedure: ESOPHAGOGASTRODUODENOSCOPY (EGD) WITH PROPOFOL;  Surgeon: Christena Deem, MD;  Location: [facility] ENDOSCOPY;  Service: Endoscopy;  Laterality: N/A;   HYSTEROSCOPY WITH D & C N/A 01/02/2017   Procedure: DILATATION AND CURETTAGE /HYSTEROSCOPY;  Surgeon: Herold Harms, MD;  Location: [facility] ORS;  Service: Gynecology;  Laterality: N/A;   JOINT REPLACEMENT Right 2007   Total Knee Replacement   JOINT REPLACEMENT Left 2004   Total Knee Replacement   LAPAROSCOPIC HYSTERECTOMY Bilateral 03/01/2017   Procedure: HYSTERECTOMY TOTAL  LAPAROSCOPIC BSO;  Surgeon: Leida Lauth, MD;  Location: [facility] ORS;  Service: Gynecology;  Laterality: Bilateral;   ROTATOR CUFF REPAIR Right    SENTINEL NODE BIOPSY N/A 03/01/2017   Procedure: SENTINEL NODE INJECTION AND BIOPSY;  Surgeon: Leida Lauth, MD;  Location: [facility] ORS;  Service: Gynecology;  Laterality: N/A;   TONSILLECTOMY     as a child   TUBAL LIGATION     UPPER GI ENDOSCOPY     x2    Home Medications:  Allergies as of 07/17/2023       Reactions   Clindamycin/lincomycin Rash   Codeine Rash   Lincomycin Rash   Morphine And Codeine Nausea Only   Talwin [pentazocine] Nausea And Vomiting   Clindamycin Other (See Comments)   Morphine Other (See Comments)   Oxycodone Other (See Comments)   Lisinopril Cough   cough   Naloxone Other (See Comments)   Had issues with sedation and vomiting with EGD   Oxycodone-acetaminophen Other (See Comments)   Pt states this medication makes her bowels stop moving   Warfarin And Related Nausea Only        Medication List        Accurate as of July 17, 2023 11:16 AM. If you have any questions, ask your nurse or doctor.          STOP taking these medications    benzonatate 100 MG capsule Commonly known as: TESSALON Stopped by: Lorin Picket A Yamilka Lopiccolo   loratadine 10 MG tablet Commonly known as: CLARITIN Stopped by: Lorin Picket A Krista Godsil   NUTRITIONAL SUPPLEMENT PO Stopped by: Lorin Picket A Casmira Cramer   tiZANidine 4 MG tablet Commonly known as: ZANAFLEX Stopped by: Lorin Picket A Wandy Bossler       TAKE these medications    Accu-Chek Guide test strip Generic drug: glucose blood TEST BLOOD SUGAR TWICE DAILY   accu-chek softclix lancets Use to test blood sugars 1 -2 times daily   acetaminophen 500 MG tablet Commonly known as: TYLENOL Take 2 tablets (1,000 mg total) by mouth every 6 (six) hours. What changed:  when to take this reasons to take this   albuterol 108 (90 Base) MCG/ACT inhaler Commonly known as: VENTOLIN  HFA Inhale 2 puffs into the lungs every 6 (six) hours as needed for wheezing or shortness of breath.   Ascorbic Acid 500 MG Chew Chew by mouth.   atorvastatin 40 MG tablet Commonly known as: LIPITOR TAKE 1/2 TABLET EVERY DAY  AT  6  PM   azelastine 0.1 % nasal spray Commonly known as: ASTELIN Place 1 spray into both nostrils daily.   Biotin 5000 MCG Tabs Take by mouth daily.   blood glucose meter kit and supplies Use to check blood sugar twice daily. ICD 10: E11.29   cephALEXin 500 MG capsule Commonly known as: KEFLEX Take 1,000 mg by mouth as directed. Take 2 capsules (1000mg ) by mouth two hours BEFORE any dental procedure and take 1000mg  by mouth SIX HOURS AFTER any dental procedure   Cetirizine HCl 10 MG Caps Take by mouth.   Cinnamon Bark Powd Take by mouth.  COLLAGEN PO Take by mouth daily. For improved fingernail strength   cyanocobalamin 1000 MCG tablet Commonly known as: VITAMIN B12 Take 1,000 mcg by mouth daily.   docusate sodium 100 MG capsule Commonly known as: Colace Take 1 capsule (100 mg total) by mouth 2 (two) times daily.   fluticasone 50 MCG/ACT nasal spray Commonly known as: FLONASE Place 1 spray into both nostrils daily.   fluticasone 50 MCG/BLIST diskus inhaler Commonly known as: FLOVENT DISKUS Inhale 1 puff into the lungs 2 (two) times daily.   furosemide 20 MG tablet Commonly known as: LASIX Take 1 tablet (20 mg total) by mouth 2 (two) times a week. As needed for fluid retention   Gemtesa 75 MG Tabs Generic drug: Vibegron Take 1 tablet (75 mg total) by mouth daily.   Jardiance 10 MG Tabs tablet Generic drug: empagliflozin TAKE 1 TABLET BY MOUTH ONCE DAILY BEFORE BREAKFAST   losartan 100 MG tablet Commonly known as: COZAAR Take 1 tablet (100 mg total) by mouth daily.   Magnesium 250 MG Tabs Take 1 tablet by mouth daily.   meclizine 25 MG tablet Commonly known as: ANTIVERT Take 1 tablet (25 mg total) by mouth 3 (three) times  daily as needed for dizziness.   metFORMIN 500 MG 24 hr tablet Commonly known as: GLUCOPHAGE-XR Take 2 tablets (1,000 mg total) by mouth daily with breakfast.   multivitamin tablet Take 1 tablet by mouth daily.   nitroGLYCERIN 0.4 MG SL tablet Commonly known as: NITROSTAT Place 0.4 mg under the tongue.   Ozempic (0.25 or 0.5 MG/DOSE) 2 MG/3ML Sopn Generic drug: Semaglutide(0.25 or 0.5MG /DOS) INJECT 0.25 MG INTO THE SKIN ONCE A WEEK.   pantoprazole 40 MG tablet Commonly known as: PROTONIX Take 1 tablet (40 mg total) by mouth 2 (two) times daily.   potassium chloride SA 20 MEQ tablet Commonly known as: KLOR-CON M TAKE 1 TABLET 2 TIMES A WEEK.   Probiotic-10 Caps Take 1 capsule by mouth daily.   sertraline 50 MG tablet Commonly known as: ZOLOFT Take 1 tablet (50 mg total) by mouth daily.   verapamil 120 MG 24 hr capsule Commonly known as: VERELAN Take 1 capsule (120 mg total) by mouth daily.   Zinc 50 MG Tabs Take by mouth daily.        Allergies:  Allergies  Allergen Reactions   Clindamycin/Lincomycin Rash   Codeine Rash   Lincomycin Rash   Morphine And Codeine Nausea Only   Talwin [Pentazocine] Nausea And Vomiting   Clindamycin Other (See Comments)   Morphine Other (See Comments)   Oxycodone Other (See Comments)   Lisinopril Cough    cough   Naloxone Other (See Comments)    Had issues with sedation and vomiting with EGD   Oxycodone-Acetaminophen Other (See Comments)    Pt states this medication makes her bowels stop moving   Warfarin And Related Nausea Only    Family History: Family History  Problem Relation Age of Onset   Stroke Mother    Hypertension Mother    Lupus Mother    Alcohol abuse Father    Diabetes Father    Cancer Father        gallbladder ca into liver    Arthritis Sister    Diabetes Sister    Lumbar disc disease Sister    Hyperlipidemia Daughter    Hypertension Daughter    Cancer Maternal Aunt        ovarian ca   Diabetes  Paternal Aunt  Drug abuse Paternal Aunt    Cancer Maternal Grandfather        colon ca   Hypertension Brother    Alcohol abuse Brother    Stroke Brother     Social History:  reports that she has never smoked. She has never been exposed to tobacco smoke. She has never used smokeless tobacco. She reports that she does not currently use alcohol. She reports that she does not use drugs.  ROS:                                        Physical Exam: There were no vitals taken for this visit.  Constitutional:  Alert and oriented, No acute distress. HEENT: [place] AT, moist mucus membranes.  Trachea midline, no masses.  Laboratory Data: Lab Results  Component Value Date   WBC 3.0 (L) 08/30/2022   HGB 13.2 08/30/2022   HCT 39.5 08/30/2022   MCV 88.6 08/30/2022   PLT 301.0 08/30/2022    Lab Results  Component Value Date   CREATININE 0.88 02/14/2023    No results found for: "PSA"  No results found for: "TESTOSTERONE"  Lab Results  Component Value Date   HGBA1C 6.4 (A) 02/14/2023    Urinalysis    Component Value Date/Time   COLORURINE YELLOW 06/28/2022 1452   APPEARANCEUR Clear 01/23/2023 1042   LABSPEC <=1.005 (A) 06/28/2022 1452   PHURINE 6.5 06/28/2022 1452   GLUCOSEU Negative 01/23/2023 1042   GLUCOSEU >=1000 (A) 06/28/2022 1452   HGBUR NEGATIVE 06/28/2022 1452   BILIRUBINUR Negative 01/23/2023 1042   KETONESUR NEGATIVE 06/28/2022 1452   PROTEINUR Negative 01/23/2023 1042   PROTEINUR 30 (A) 02/20/2015 2137   UROBILINOGEN 0.2 06/28/2022 1452   NITRITE Negative 01/23/2023 1042   NITRITE NEGATIVE 06/28/2022 1452   LEUKOCYTESUR Negative 01/23/2023 1042   LEUKOCYTESUR NEGATIVE 06/28/2022 1452    Pertinent Imaging:   Assessment & Plan: Samples given.  Reassess 93-month.  Very grateful  1. Mixed incontinence  - Urinalysis, Complete   No follow-ups on file.  Martina Sinner, MD  [facility] [address],  [address] [place], [place] [address] [phone]

## 2023-07-18 DIAGNOSIS — M19071 Primary osteoarthritis, right ankle and foot: Secondary | ICD-10-CM | POA: Diagnosis not present

## 2023-07-18 DIAGNOSIS — M79671 Pain in right foot: Secondary | ICD-10-CM | POA: Diagnosis not present

## 2023-07-18 DIAGNOSIS — M25461 Effusion, right knee: Secondary | ICD-10-CM | POA: Diagnosis not present

## 2023-07-18 DIAGNOSIS — Z96651 Presence of right artificial knee joint: Secondary | ICD-10-CM | POA: Diagnosis not present

## 2023-07-19 ENCOUNTER — Other Ambulatory Visit: Payer: Self-pay | Admitting: Physician Assistant

## 2023-07-19 DIAGNOSIS — M25561 Pain in right knee: Secondary | ICD-10-CM

## 2023-07-24 ENCOUNTER — Ambulatory Visit
Admission: RE | Admit: 2023-07-24 | Discharge: 2023-07-24 | Disposition: A | Discharge status: Home / Self Care | Payer: Medicare PPO | Source: Ambulatory Visit | Attending: Physician Assistant | Admitting: Physician Assistant

## 2023-07-24 DIAGNOSIS — M25561 Pain in right knee: Secondary | ICD-10-CM | POA: Diagnosis not present

## 2023-07-24 DIAGNOSIS — M79604 Pain in right leg: Secondary | ICD-10-CM | POA: Diagnosis not present

## 2023-07-27 DIAGNOSIS — M189 Osteoarthritis of first carpometacarpal joint, unspecified: Secondary | ICD-10-CM | POA: Diagnosis not present

## 2023-07-27 DIAGNOSIS — M1812 Unilateral primary osteoarthritis of first carpometacarpal joint, left hand: Secondary | ICD-10-CM | POA: Diagnosis not present

## 2023-07-28 DIAGNOSIS — Z961 Presence of intraocular lens: Secondary | ICD-10-CM | POA: Diagnosis not present

## 2023-07-28 DIAGNOSIS — H26492 Other secondary cataract, left eye: Secondary | ICD-10-CM | POA: Diagnosis not present

## 2023-07-28 DIAGNOSIS — I10 Essential (primary) hypertension: Secondary | ICD-10-CM | POA: Diagnosis not present

## 2023-07-28 DIAGNOSIS — H18413 Arcus senilis, bilateral: Secondary | ICD-10-CM | POA: Diagnosis not present

## 2023-07-28 DIAGNOSIS — E119 Type 2 diabetes mellitus without complications: Secondary | ICD-10-CM | POA: Diagnosis not present

## 2023-07-31 DIAGNOSIS — Z96651 Presence of right artificial knee joint: Secondary | ICD-10-CM | POA: Diagnosis not present

## 2023-07-31 DIAGNOSIS — M79671 Pain in right foot: Secondary | ICD-10-CM | POA: Diagnosis not present

## 2023-07-31 DIAGNOSIS — M67961 Unspecified disorder of synovium and tendon, right lower leg: Secondary | ICD-10-CM | POA: Diagnosis not present

## 2023-08-07 DIAGNOSIS — Z961 Presence of intraocular lens: Secondary | ICD-10-CM | POA: Diagnosis not present

## 2023-08-07 DIAGNOSIS — H04123 Dry eye syndrome of bilateral lacrimal glands: Secondary | ICD-10-CM | POA: Diagnosis not present

## 2023-08-07 DIAGNOSIS — E119 Type 2 diabetes mellitus without complications: Secondary | ICD-10-CM | POA: Diagnosis not present

## 2023-08-07 DIAGNOSIS — Z7984 Long term (current) use of oral hypoglycemic drugs: Secondary | ICD-10-CM | POA: Diagnosis not present

## 2023-08-07 DIAGNOSIS — H26492 Other secondary cataract, left eye: Secondary | ICD-10-CM | POA: Diagnosis not present

## 2023-08-17 ENCOUNTER — Ambulatory Visit: Payer: Medicare PPO | Admitting: Internal Medicine

## 2023-08-17 ENCOUNTER — Encounter: Payer: Self-pay | Admitting: Internal Medicine

## 2023-08-17 VITALS — BP 124/70 | HR 67 | Ht 60.0 in | Wt 137.2 lb

## 2023-08-17 DIAGNOSIS — Z1231 Encounter for screening mammogram for malignant neoplasm of breast: Secondary | ICD-10-CM

## 2023-08-17 DIAGNOSIS — C55 Malignant neoplasm of uterus, part unspecified: Secondary | ICD-10-CM

## 2023-08-17 DIAGNOSIS — E1122 Type 2 diabetes mellitus with diabetic chronic kidney disease: Secondary | ICD-10-CM

## 2023-08-17 DIAGNOSIS — N393 Stress incontinence (female) (male): Secondary | ICD-10-CM | POA: Diagnosis not present

## 2023-08-17 DIAGNOSIS — I129 Hypertensive chronic kidney disease with stage 1 through stage 4 chronic kidney disease, or unspecified chronic kidney disease: Secondary | ICD-10-CM | POA: Diagnosis not present

## 2023-08-17 DIAGNOSIS — K591 Functional diarrhea: Secondary | ICD-10-CM

## 2023-08-17 DIAGNOSIS — N1831 Chronic kidney disease, stage 3a: Secondary | ICD-10-CM | POA: Diagnosis not present

## 2023-08-17 DIAGNOSIS — E785 Hyperlipidemia, unspecified: Secondary | ICD-10-CM | POA: Diagnosis not present

## 2023-08-17 DIAGNOSIS — E1169 Type 2 diabetes mellitus with other specified complication: Secondary | ICD-10-CM | POA: Diagnosis not present

## 2023-08-17 DIAGNOSIS — I10 Essential (primary) hypertension: Secondary | ICD-10-CM | POA: Diagnosis not present

## 2023-08-17 DIAGNOSIS — Z7984 Long term (current) use of oral hypoglycemic drugs: Secondary | ICD-10-CM

## 2023-08-17 DIAGNOSIS — N183 Chronic kidney disease, stage 3 unspecified: Secondary | ICD-10-CM | POA: Diagnosis not present

## 2023-08-17 LAB — COMPREHENSIVE METABOLIC PANEL
ALT: 32 U/L (ref 0–35)
AST: 29 U/L (ref 0–37)
Albumin: 4.4 g/dL (ref 3.5–5.2)
Alkaline Phosphatase: 104 U/L (ref 39–117)
BUN: 18 mg/dL (ref 6–23)
CO2: 27 meq/L (ref 19–32)
Calcium: 9.5 mg/dL (ref 8.4–10.5)
Chloride: 104 meq/L (ref 96–112)
Creatinine, Ser: 0.81 mg/dL (ref 0.40–1.20)
GFR: 69.07 mL/min (ref >60.00)
Glucose, Bld: 116 mg/dL — ABNORMAL HIGH (ref 70–99)
Potassium: 3.7 meq/L (ref 3.5–5.1)
Sodium: 139 meq/L (ref 135–145)
Total Bilirubin: 0.8 mg/dL (ref 0.2–1.2)
Total Protein: 7 g/dL (ref 6.0–8.3)

## 2023-08-17 LAB — LIPID PANEL
Cholesterol: 212 mg/dL — ABNORMAL HIGH (ref 0–200)
HDL: 103.5 mg/dL (ref >39.00)
LDL Cholesterol: 97 mg/dL (ref 0–99)
NonHDL: 108.7
Total CHOL/HDL Ratio: 2
Triglycerides: 58 mg/dL (ref 0.0–149.0)
VLDL: 11.6 mg/dL (ref 0.0–40.0)

## 2023-08-17 LAB — HEMOGLOBIN A1C: Hgb A1c MFr Bld: 7.8 % — ABNORMAL HIGH (ref 4.6–6.5)

## 2023-08-17 LAB — LDL CHOLESTEROL, DIRECT: Direct LDL: 72 mg/dL

## 2023-08-17 MED ORDER — VERAPAMIL HCL ER 120 MG PO CP24: 120.0000 mg | ORAL_CAPSULE | Freq: Every day | ORAL | 1 refills | Status: DC

## 2023-08-17 NOTE — Progress Notes (Signed)
Subjective:  Patient ID: Sandra Hendrix, female    DOB: 21-Feb-1946  Age: 77 y.o. MRN: [id number]  CC: The primary encounter diagnosis was Encounter for screening mammogram for malignant neoplasm of breast. Diagnoses of Type 2 DM with CKD stage 3 and hypertension (HCC), Essential hypertension, Hyperlipidemia associated with type 2 diabetes mellitus (HCC), Functional diarrhea, Stage 3a chronic kidney disease (HCC), and Endometrioid adenocarcinoma of uterus (HCC) were also pertinent to this visit.   [name] presents for  Chief Complaint  Patient presents with   Medical Management of Chronic Issues    6 month follow up    1) she states that she has fully recovered from a respiratory infection by COVID  2 WEEKS AGO COVID .  Did NOT TAKE paxlovid .     2) T2DM:  She  feels generally well,  HAS RESUMED exercising regularly 3 times weekly after her right leg pain finally resolved.  Not checking  blood sugars lately,  than once daily at variable times, usually only if she feels she may be having a hypoglycemic event. . In the past  because of Ozempic , she lost too much weight and started eating poorly to gain weight.  Has not been checking  blood sugars  lately and admits that although she has "stopped cooking [place]" she has been eating a lot of bread   Denies any recent hypoglyemic events.  Taking   medications as directed. Following a carbohydrate modified diet 6 days per week. Denies numbness, burning and tingling of extremities. Appetite is good.    3) Hypertension  4)  post prandial loose stools  occurring once daily ,  other times is constipated  . Has not kept  food diary  Lab Results  Component Value Date   HGBA1C 7.8 (H) 08/17/2023     Outpatient Medications Prior to Visit  Medication Sig Dispense Refill   acetaminophen (TYLENOL) 500 MG tablet Take 2 tablets (1,000 mg total) by mouth every 6 (six) hours. (Patient taking differently: Take 1,000 mg by mouth every 6  (six) hours as needed.) 65 tablet 1   albuterol (PROVENTIL HFA;VENTOLIN HFA) 108 (90 BASE) MCG/ACT inhaler Inhale 2 puffs into the lungs every 6 (six) hours as needed for wheezing or shortness of breath.      Ascorbic Acid 500 MG CHEW Chew by mouth.     atorvastatin (LIPITOR) 40 MG tablet TAKE 1/2 TABLET EVERY DAY  AT  6  PM 45 tablet 3   azelastine (ASTELIN) 0.1 % nasal spray Place 1 spray into both nostrils daily.     Biotin 5000 MCG TABS Take by mouth daily.     blood glucose meter kit and supplies Use to check blood sugar twice daily. ICD 10: E11.29 1 each 0   Cetirizine HCl 10 MG CAPS Take by mouth.     Cinnamon Bark POWD Take by mouth.     COLLAGEN PO Take by mouth daily. For improved fingernail strength     docusate sodium (COLACE) 100 MG capsule Take 1 capsule (100 mg total) by mouth 2 (two) times daily. 20 capsule 0   fluticasone (FLONASE) 50 MCG/ACT nasal spray Place 1 spray into both nostrils daily. 48 g 1   fluticasone (FLOVENT DISKUS) 50 MCG/BLIST diskus inhaler Inhale 1 puff into the lungs 2 (two) times daily.     furosemide (LASIX) 20 MG tablet Take 1 tablet (20 mg total) by mouth 2 (two) times a week. As needed for  fluid retention 45 tablet 3   glucose blood (ACCU-CHEK GUIDE) test strip TEST BLOOD SUGAR TWICE DAILY 200 strip 10   Lancet Devices (ACCU-CHEK SOFTCLIX) lancets Use to test blood sugars 1 -2 times daily 1 each 3   Magnesium 250 MG TABS Take 1 tablet by mouth daily.     meclizine (ANTIVERT) 25 MG tablet Take 1 tablet (25 mg total) by mouth 3 (three) times daily as needed for dizziness. 30 tablet 3   metFORMIN (GLUCOPHAGE-XR) 500 MG 24 hr tablet TAKE 2 TABLETS EVERY DAY WITH BREAKFAST (DOSE INCREASE) 180 tablet 3   Multiple Vitamin (MULTIVITAMIN) tablet Take 1 tablet by mouth daily.     nitroGLYCERIN (NITROSTAT) 0.4 MG SL tablet Place 0.4 mg under the tongue.     pantoprazole (PROTONIX) 40 MG tablet Take 1 tablet (40 mg total) by mouth 2 (two) times daily. 180 tablet 3    potassium chloride SA (KLOR-CON M) 20 MEQ tablet TAKE 1 TABLET 2 TIMES A WEEK. 26 tablet 3   Probiotic Product (PROBIOTIC-10) CAPS Take 1 capsule by mouth daily.      Semaglutide,0.25 or 0.5MG /DOS, (OZEMPIC, 0.25 OR 0.5 MG/DOSE,) 2 MG/3ML SOPN INJECT 0.25 MG INTO THE SKIN ONCE A WEEK. 6 mL 3   sertraline (ZOLOFT) 50 MG tablet Take 1 tablet (50 mg total) by mouth daily. 90 tablet 3   Vibegron (GEMTESA) 75 MG TABS Take 1 tablet (75 mg total) by mouth daily. 30 tablet 11   vitamin B-12 (CYANOCOBALAMIN) 1000 MCG tablet Take 1,000 mcg by mouth daily.     Zinc 50 MG TABS Take by mouth daily.     empagliflozin (JARDIANCE) 10 MG TABS tablet TAKE 1 TABLET BY MOUTH ONCE DAILY BEFORE BREAKFAST 90 tablet 1   verapamil (VERELAN) 120 MG 24 hr capsule Take 1 capsule (120 mg total) by mouth daily. 90 capsule 1   cephALEXin (KEFLEX) 500 MG capsule Take 1,000 mg by mouth as directed. Take 2 capsules (1000mg ) by mouth two hours BEFORE any dental procedure and take 1000mg  by mouth SIX HOURS AFTER any dental procedure (Patient not taking: Reported on 08/17/2023)     losartan (COZAAR) 100 MG tablet Take 1 tablet (100 mg total) by mouth daily. (Patient not taking: Reported on 08/17/2023) 90 tablet 1   No facility-administered medications prior to visit.    Review of Systems;  Patient denies headache, fevers, malaise, unintentional weight loss, skin rash, eye pain, sinus congestion and sinus pain, sore throat, dysphagia,  hemoptysis , cough, dyspnea, wheezing, chest pain, palpitations, orthopnea, edema, abdominal pain, nausea, melena, diarrhea, constipation, flank pain, dysuria, hematuria, urinary  Frequency, nocturia, numbness, tingling, seizures,  Focal weakness, Loss of consciousness,  Tremor, insomnia, depression, anxiety, and suicidal ideation.      Objective:  BP 124/70   Pulse 67   Ht 5' (1.524 m)   Wt 137 lb 3.2 oz (62.2 kg)   SpO2 95%   BMI 26.80 kg/m   BP Readings from Last 3 Encounters:   08/17/23 124/70  03/09/23 102/64  02/14/23 102/64    Wt Readings from Last 3 Encounters:  08/17/23 137 lb 3.2 oz (62.2 kg)  03/09/23 140 lb (63.5 kg)  02/14/23 140 lb 9.6 oz (63.8 kg)    Physical Exam Vitals reviewed.  Constitutional:      General: She is not in acute distress.    Appearance: Normal appearance. She is normal weight. She is not ill-appearing, toxic-appearing or diaphoretic.  HENT:     Head: Normocephalic.  Eyes:     General: No scleral icterus.       Right eye: No discharge.        Left eye: No discharge.     Conjunctiva/sclera: Conjunctivae normal.  Cardiovascular:     Rate and Rhythm: Normal rate and regular rhythm.     Heart sounds: Normal heart sounds.  Pulmonary:     Effort: Pulmonary effort is normal. No respiratory distress.     Breath sounds: Normal breath sounds.  Musculoskeletal:        General: Normal range of motion.  Skin:    General: Skin is warm and dry.  Neurological:     General: No focal deficit present.     Mental Status: She is alert and oriented to person, place, and time. Mental status is at baseline.  Psychiatric:        Mood and Affect: Mood normal.        Behavior: Behavior normal.        Thought Content: Thought content normal.        Judgment: Judgment normal.    Lab Results  Component Value Date   HGBA1C 7.8 (H) 08/17/2023   HGBA1C 6.4 (A) 02/14/2023   HGBA1C 8.3 (H) 08/30/2022    Lab Results  Component Value Date   CREATININE 0.81 08/17/2023   CREATININE 0.88 02/14/2023   CREATININE 0.96 08/30/2022    Lab Results  Component Value Date   WBC 3.0 (L) 08/30/2022   HGB 13.2 08/30/2022   HCT 39.5 08/30/2022   PLT 301.0 08/30/2022   GLUCOSE 116 (H) 08/17/2023   CHOL 212 (H) 08/17/2023   TRIG 58.0 08/17/2023   HDL 103.50 08/17/2023   LDLDIRECT 72.0 08/17/2023   LDLCALC 97 08/17/2023   ALT 32 08/17/2023   AST 29 08/17/2023   NA 139 08/17/2023   K 3.7 08/17/2023   CL 104 08/17/2023   CREATININE 0.81  08/17/2023   BUN 18 08/17/2023   CO2 27 08/17/2023   TSH 3.00 08/30/2022   INR 1.1 01/23/2009   HGBA1C 7.8 (H) 08/17/2023   MICROALBUR 5 01/24/2023    US Venous Img Lower Unilateral Right (DVT)  Result Date: 07/26/2023 CLINICAL DATA:  Right lower extremity pain for the past 2 weeks. History of uterine cancer. Evaluate for DVT. EXAM: RIGHT LOWER EXTREMITY VENOUS DOPPLER ULTRASOUND TECHNIQUE: Gray-scale sonography with graded compression, as well as color Doppler and duplex ultrasound were performed to evaluate the lower extremity deep venous systems from the level of the common femoral vein and including the common femoral, femoral, profunda femoral, popliteal and calf veins including the posterior tibial, peroneal and gastrocnemius veins when visible. The superficial great saphenous vein was also interrogated. Spectral Doppler was utilized to evaluate flow at rest and with distal augmentation maneuvers in the common femoral, femoral and popliteal veins. COMPARISON:  None Available. FINDINGS: Contralateral Common Femoral Vein: Respiratory phasicity is normal and symmetric with the symptomatic side. No evidence of thrombus. Normal compressibility. Common Femoral Vein: No evidence of thrombus. Normal compressibility, respiratory phasicity and response to augmentation. Saphenofemoral Junction: No evidence of thrombus. Normal compressibility and flow on color Doppler imaging. Profunda Femoral Vein: No evidence of thrombus. Normal compressibility and flow on color Doppler imaging. Femoral Vein: No evidence of thrombus. Normal compressibility, respiratory phasicity and response to augmentation. Popliteal Vein: No evidence of thrombus. Normal compressibility, respiratory phasicity and response to augmentation. Calf Veins: No evidence of thrombus. Normal compressibility and flow on color Doppler imaging. Superficial Great Saphenous Vein:  No evidence of thrombus. Normal compressibility. Other Findings:  None.  IMPRESSION: No evidence of DVT within the right lower extremity. Electronically Signed   By: Simonne Come M.D.   On: 07/26/2023 18:11    Assessment & Plan:  .Encounter for screening mammogram for malignant neoplasm of breast -     3D Screening Mammogram, Left and Right; Future  Type 2 DM with CKD stage 3 and hypertension (HCC) Assessment & Plan: She has lost control despite resuming ozempic, metformin, low dose jardiance and stopping [place]. Will increase jardiance ot 25 mg dAILY. n   She has no microalbuinuria and losratan was stopped at last visit due to hypotension   Lab Results  Component Value Date   HGBA1C 7.8 (H) 08/17/2023   Lab Results  Component Value Date   LABMICR See below: 07/17/2023   LABMICR See below: 01/23/2023   MICROALBUR 5 01/24/2023   MICROALBUR 1.0 11/15/2021   Lab Results  Component Value Date   CHOL 212 (H) 08/17/2023   HDL 103.50 08/17/2023   LDLCALC 97 08/17/2023   LDLDIRECT 72.0 08/17/2023   TRIG 58.0 08/17/2023   CHOLHDL 2 08/17/2023     Orders: -     Comprehensive metabolic panel -     Hemoglobin A1c  Essential hypertension Assessment & Plan: Managed with verelan only since starting  Jardiance   Orders: -     Comprehensive metabolic panel  Hyperlipidemia associated with type 2 diabetes mellitus (HCC) -     Lipid panel -     LDL cholesterol, direct  Functional diarrhea Assessment & Plan: Etiology unclear .  May be lactose intolerance Advised to keep a food diary   Stage 3a chronic kidney disease (HCC) Assessment & Plan: Stable for over 4 years .  Taking Jardiance.  Losartan stopped due to hypotension. .. She is avoiding nephrotoxic agents   Lab Results  Component Value Date   CREATININE 0.81 08/17/2023   Lab Results  Component Value Date   NA 139 08/17/2023   K 3.7 08/17/2023   CL 104 08/17/2023   CO2 27 08/17/2023      Endometrioid adenocarcinoma of uterus (HCC) Assessment & Plan: Currently taking Letrozole .     Other orders -     Verapamil HCl ER; Take 1 capsule (120 mg total) by mouth daily.  Dispense: 90 capsule; Refill: 1 -     Empagliflozin; Take 1 tablet (25 mg total) by mouth daily before breakfast.  Dispense: 30 tablet; Refill: 5     Follow-up: No follow-ups on file.   Sherlene Shams, MD

## 2023-08-17 NOTE — Patient Instructions (Addendum)
10 FOR THE DIARRHEA:    Keep a food diary to figure out if the diarrhea is caused by certain foods If the atkins shakes are the culprit,  switch  to Fairlife shakes which are lactose free   It may be due to  dulcolax; it may be too strong.  I recommend that you  add  a daily bulk forming laxative Citrucel, Metamucil, Benefiber (my favorite )   or Fibercon  at bedtime   2) Blood pressure: You do not need to resume  losartan  because your blood pressure is at goal .   Check BP once a week .   AS LONG AS IT STAYS aT 130/80 OR LESS,  ALL YOU NEED IS VERAPAMIL  3) you have a "hiatal hernia" see below

## 2023-08-17 NOTE — Assessment & Plan Note (Addendum)
She has lost control despite resuming ozempic, metformin, low dose jardiance and stopping [place]. Will increase jardiance ot 25 mg dAILY. n   She has no microalbuinuria and losratan was stopped at last visit due to hypotension   Lab Results  Component Value Date   HGBA1C 7.8 (H) 08/17/2023   Lab Results  Component Value Date   LABMICR See below: 07/17/2023   LABMICR See below: 01/23/2023   MICROALBUR 5 01/24/2023   MICROALBUR 1.0 11/15/2021   Lab Results  Component Value Date   CHOL 212 (H) 08/17/2023   HDL 103.50 08/17/2023   LDLCALC 97 08/17/2023   LDLDIRECT 72.0 08/17/2023   TRIG 58.0 08/17/2023   CHOLHDL 2 08/17/2023

## 2023-08-18 DIAGNOSIS — H26491 Other secondary cataract, right eye: Secondary | ICD-10-CM | POA: Diagnosis not present

## 2023-08-19 ENCOUNTER — Encounter: Payer: Self-pay | Admitting: Internal Medicine

## 2023-08-19 DIAGNOSIS — R197 Diarrhea, unspecified: Secondary | ICD-10-CM | POA: Insufficient documentation

## 2023-08-19 MED ORDER — EMPAGLIFLOZIN 25 MG PO TABS: 25.0000 mg | ORAL_TABLET | Freq: Every day | ORAL | 5 refills | Status: DC

## 2023-08-19 NOTE — Assessment & Plan Note (Signed)
Currently taking Letrozole .

## 2023-08-19 NOTE — Assessment & Plan Note (Signed)
Stable for over 4 years .  Taking Jardiance.  Losartan stopped due to hypotension. .. She is avoiding nephrotoxic agents   Lab Results  Component Value Date   CREATININE 0.81 08/17/2023   Lab Results  Component Value Date   NA 139 08/17/2023   K 3.7 08/17/2023   CL 104 08/17/2023   CO2 27 08/17/2023

## 2023-08-19 NOTE — Assessment & Plan Note (Signed)
Etiology unclear .  May be lactose intolerance Advised to keep a food diary

## 2023-08-19 NOTE — Assessment & Plan Note (Signed)
Managed with verelan only since starting  Jardiance

## 2023-08-19 NOTE — Assessment & Plan Note (Signed)
Managed tith [place].  Reviewed last note from Urology

## 2023-08-20 MED ORDER — LOSARTAN POTASSIUM 100 MG PO TABS: 100.0000 mg | ORAL_TABLET | Freq: Every day | ORAL | 1 refills | Status: DC

## 2023-08-20 NOTE — Addendum Note (Signed)
Addended by: Sherlene Shams on: 08/20/2023 09:12 PM   Modules accepted: Orders

## 2023-08-21 NOTE — Telephone Encounter (Signed)
noted 

## 2023-08-21 NOTE — Telephone Encounter (Signed)
Losartan is in medication list.

## 2023-08-22 DIAGNOSIS — H26491 Other secondary cataract, right eye: Secondary | ICD-10-CM | POA: Diagnosis not present

## 2023-08-22 DIAGNOSIS — Z961 Presence of intraocular lens: Secondary | ICD-10-CM | POA: Diagnosis not present

## 2023-08-22 DIAGNOSIS — H04123 Dry eye syndrome of bilateral lacrimal glands: Secondary | ICD-10-CM | POA: Diagnosis not present

## 2023-08-22 DIAGNOSIS — E119 Type 2 diabetes mellitus without complications: Secondary | ICD-10-CM | POA: Diagnosis not present

## 2023-08-22 DIAGNOSIS — Z7984 Long term (current) use of oral hypoglycemic drugs: Secondary | ICD-10-CM | POA: Diagnosis not present

## 2023-09-02 ENCOUNTER — Other Ambulatory Visit: Payer: Self-pay | Admitting: Internal Medicine

## 2023-09-11 ENCOUNTER — Other Ambulatory Visit: Payer: Self-pay | Admitting: *Deleted

## 2023-09-11 MED ORDER — GEMTESA 75 MG PO TABS: 1.0000 | ORAL_TABLET | Freq: Every day | ORAL | 3 refills | Status: DC

## 2023-09-26 DIAGNOSIS — I10 Essential (primary) hypertension: Secondary | ICD-10-CM | POA: Diagnosis not present

## 2023-09-26 DIAGNOSIS — E1122 Type 2 diabetes mellitus with diabetic chronic kidney disease: Secondary | ICD-10-CM | POA: Diagnosis not present

## 2023-09-26 DIAGNOSIS — R600 Localized edema: Secondary | ICD-10-CM | POA: Diagnosis not present

## 2023-09-26 DIAGNOSIS — N182 Chronic kidney disease, stage 2 (mild): Secondary | ICD-10-CM | POA: Diagnosis not present

## 2023-10-03 DIAGNOSIS — I10 Essential (primary) hypertension: Secondary | ICD-10-CM | POA: Diagnosis not present

## 2023-10-03 DIAGNOSIS — N182 Chronic kidney disease, stage 2 (mild): Secondary | ICD-10-CM | POA: Diagnosis not present

## 2023-10-03 DIAGNOSIS — E1122 Type 2 diabetes mellitus with diabetic chronic kidney disease: Secondary | ICD-10-CM | POA: Diagnosis not present

## 2023-10-05 ENCOUNTER — Other Ambulatory Visit (HOSPITAL_COMMUNITY): Payer: Self-pay

## 2023-10-12 DIAGNOSIS — H903 Sensorineural hearing loss, bilateral: Secondary | ICD-10-CM | POA: Diagnosis not present

## 2023-10-19 ENCOUNTER — Telehealth: Payer: Self-pay | Admitting: Pharmacy Technician

## 2023-10-19 ENCOUNTER — Other Ambulatory Visit (HOSPITAL_COMMUNITY): Payer: Self-pay

## 2023-10-19 NOTE — Telephone Encounter (Signed)
Pharmacy Patient Advocate Encounter   Received notification from CoverMyMeds that prior authorization for Ozempic (0.25 or 0.5 MG/DOSE) 2MG /3ML pen-injectors is required/requested.   Insurance verification completed.   The patient is insured through [employer] .   Per test claim: Refill too soon. PA is not needed at this time. Medication was filled 09/08/2023. Next eligible fill date is 11/17/2023.

## 2023-11-03 ENCOUNTER — Other Ambulatory Visit: Payer: Self-pay | Admitting: Internal Medicine

## 2023-11-13 ENCOUNTER — Encounter: Payer: Self-pay | Admitting: Pulmonary Disease

## 2023-11-13 DIAGNOSIS — G4733 Obstructive sleep apnea (adult) (pediatric): Secondary | ICD-10-CM | POA: Diagnosis not present

## 2024-01-15 ENCOUNTER — Ambulatory Visit: Payer: Self-pay | Admitting: Urology

## 2024-01-17 ENCOUNTER — Other Ambulatory Visit: Payer: Self-pay | Admitting: Internal Medicine

## 2024-01-19 ENCOUNTER — Other Ambulatory Visit: Payer: Self-pay | Admitting: Internal Medicine

## 2024-01-19 MED ORDER — EMPAGLIFLOZIN 25 MG PO TABS: 25.0000 mg | ORAL_TABLET | Freq: Every day | ORAL | 5 refills | Status: DC

## 2024-01-19 NOTE — Telephone Encounter (Signed)
 Pt is no longer on the 10 mg dose she is currently taking the 25 mg dose. Is it okay to refuse the 10 mg dose?

## 2024-01-22 NOTE — Telephone Encounter (Signed)
 Noted. Prescription sent over on 01/19/24.

## 2024-01-25 ENCOUNTER — Encounter: Payer: Self-pay | Admitting: Internal Medicine

## 2024-01-25 DIAGNOSIS — Z1231 Encounter for screening mammogram for malignant neoplasm of breast: Secondary | ICD-10-CM | POA: Diagnosis not present

## 2024-01-25 LAB — HM MAMMOGRAPHY

## 2024-02-01 DIAGNOSIS — I10 Essential (primary) hypertension: Secondary | ICD-10-CM | POA: Diagnosis not present

## 2024-02-01 DIAGNOSIS — E1122 Type 2 diabetes mellitus with diabetic chronic kidney disease: Secondary | ICD-10-CM | POA: Diagnosis not present

## 2024-02-01 DIAGNOSIS — R600 Localized edema: Secondary | ICD-10-CM | POA: Diagnosis not present

## 2024-02-01 DIAGNOSIS — N182 Chronic kidney disease, stage 2 (mild): Secondary | ICD-10-CM | POA: Diagnosis not present

## 2024-02-15 ENCOUNTER — Encounter: Payer: Self-pay | Admitting: Internal Medicine

## 2024-02-15 ENCOUNTER — Ambulatory Visit: Payer: Medicare PPO | Admitting: Internal Medicine

## 2024-02-15 VITALS — BP 130/68 | HR 70 | Ht 60.0 in | Wt 137.4 lb

## 2024-02-15 DIAGNOSIS — I129 Hypertensive chronic kidney disease with stage 1 through stage 4 chronic kidney disease, or unspecified chronic kidney disease: Secondary | ICD-10-CM | POA: Diagnosis not present

## 2024-02-15 DIAGNOSIS — Z7985 Long-term (current) use of injectable non-insulin antidiabetic drugs: Secondary | ICD-10-CM | POA: Diagnosis not present

## 2024-02-15 DIAGNOSIS — I10 Essential (primary) hypertension: Secondary | ICD-10-CM

## 2024-02-15 DIAGNOSIS — E1122 Type 2 diabetes mellitus with diabetic chronic kidney disease: Secondary | ICD-10-CM | POA: Diagnosis not present

## 2024-02-15 DIAGNOSIS — R5383 Other fatigue: Secondary | ICD-10-CM | POA: Diagnosis not present

## 2024-02-15 DIAGNOSIS — N183 Chronic kidney disease, stage 3 unspecified: Secondary | ICD-10-CM

## 2024-02-15 DIAGNOSIS — E1169 Type 2 diabetes mellitus with other specified complication: Secondary | ICD-10-CM | POA: Diagnosis not present

## 2024-02-15 DIAGNOSIS — E785 Hyperlipidemia, unspecified: Secondary | ICD-10-CM

## 2024-02-15 DIAGNOSIS — Z7984 Long term (current) use of oral hypoglycemic drugs: Secondary | ICD-10-CM

## 2024-02-15 LAB — HEPATIC FUNCTION PANEL
ALT: 24 U/L (ref 0–35)
AST: 26 U/L (ref 0–37)
Albumin: 4.5 g/dL (ref 3.5–5.2)
Alkaline Phosphatase: 109 U/L (ref 39–117)
Bilirubin, Direct: 0.1 mg/dL (ref 0.0–0.3)
Total Bilirubin: 0.6 mg/dL (ref 0.2–1.2)
Total Protein: 6.9 g/dL (ref 6.0–8.3)

## 2024-02-15 LAB — COMPREHENSIVE METABOLIC PANEL WITH GFR
ALT: 24 U/L (ref 0–35)
AST: 26 U/L (ref 0–37)
Albumin: 4.5 g/dL (ref 3.5–5.2)
Alkaline Phosphatase: 109 U/L (ref 39–117)
BUN: 23 mg/dL (ref 6–23)
CO2: 29 meq/L (ref 19–32)
Calcium: 9.5 mg/dL (ref 8.4–10.5)
Chloride: 104 meq/L (ref 96–112)
Creatinine, Ser: 0.79 mg/dL (ref 0.40–1.20)
GFR: 70.93 mL/min (ref >60.00)
Glucose, Bld: 122 mg/dL — ABNORMAL HIGH (ref 70–99)
Potassium: 4.1 meq/L (ref 3.5–5.1)
Sodium: 139 meq/L (ref 135–145)
Total Bilirubin: 0.6 mg/dL (ref 0.2–1.2)
Total Protein: 6.9 g/dL (ref 6.0–8.3)

## 2024-02-15 LAB — TSH: TSH: 2.49 u[IU]/mL (ref 0.35–5.50)

## 2024-02-15 LAB — LIPID PANEL
Cholesterol: 238 mg/dL — ABNORMAL HIGH (ref 0–200)
HDL: 131.8 mg/dL (ref >39.00)
LDL Cholesterol: 95 mg/dL (ref 0–99)
NonHDL: 106.6
Total CHOL/HDL Ratio: 2
Triglycerides: 59 mg/dL (ref 0.0–149.0)
VLDL: 11.8 mg/dL (ref 0.0–40.0)

## 2024-02-15 LAB — HEMOGLOBIN A1C: Hgb A1c MFr Bld: 7.4 % — ABNORMAL HIGH (ref 4.6–6.5)

## 2024-02-15 LAB — LDL CHOLESTEROL, DIRECT: Direct LDL: 73 mg/dL

## 2024-02-15 MED ORDER — POTASSIUM CHLORIDE CRYS ER 20 MEQ PO TBCR: EXTENDED_RELEASE_TABLET | ORAL | 3 refills | Status: DC

## 2024-02-15 MED ORDER — FUROSEMIDE 20 MG PO TABS: 20.0000 mg | ORAL_TABLET | ORAL | 3 refills | Status: DC

## 2024-02-15 MED ORDER — SERTRALINE HCL 50 MG PO TABS: 50.0000 mg | ORAL_TABLET | Freq: Every day | ORAL | 3 refills | Status: DC

## 2024-02-15 MED ORDER — ATORVASTATIN CALCIUM 40 MG PO TABS: ORAL_TABLET | ORAL | 3 refills | Status: AC

## 2024-02-15 MED ORDER — PANTOPRAZOLE SODIUM 40 MG PO TBEC: 40.0000 mg | DELAYED_RELEASE_TABLET | Freq: Two times a day (BID) | ORAL | 3 refills | Status: AC

## 2024-02-15 NOTE — Patient Instructions (Addendum)
 Januvia  is contraindicated if you are using Ozempic   (you should not be on both)  You SHOULD BE ON JARDIANCE  BECAUSE IT IS PROTECTING YOUR KIDNEYS   PLEASE KEEP YOUR PHONE WITH YOU SO IT CAN PICK UP THE DATA FROM THE BLOOD GLUCOSE MONITOR 24/7  IT WILL MONITOR YOUR SUGARS  EVEN IF YOU DON'T LOOK AT YOUR PHONE KEEP A DIARY OF YOUR DESSERTS AND MEALS SO WE CAN CORRELATE WITH THE READINGS   REMIND ME IN 2 WEEKS TO REVIEW THE DATA REMOTELY AND DROP OFF YOUR FOOD DIARY    Lab Results  Component Value Date   HGBA1C 7.8 (H) 08/17/2023

## 2024-02-15 NOTE — Progress Notes (Unsigned)
 Subjective:  Patient ID: Sandra Hendrix, female    DOB: 1946/07/10  Age: 78 y.o. MRN: [id number]  CC: The primary encounter diagnosis was Type 2 DM with CKD stage 3 and hypertension (HCC). Diagnoses of Essential hypertension, Hyperlipidemia associated with type 2 diabetes mellitus (HCC), and Fatigue, unspecified type were also pertinent to this visit.   [name] presents for  Chief Complaint  Patient presents with   Medical Management of Chronic Issues    6 month follow up     1) Type 2 DM with CKD:  sugars are " not good" .  She continues to eat a high glycemic diet   Outpatient Medications Prior to Visit  Medication Sig Dispense Refill   acetaminophen  (TYLENOL ) 500 MG tablet Take 2 tablets (1,000 mg total) by mouth every 6 (six) hours. (Patient taking differently: Take 1,000 mg by mouth every 6 (six) hours as needed.) 65 tablet 1   albuterol  (PROVENTIL  HFA;VENTOLIN  HFA) 108 (90 BASE) MCG/ACT inhaler Inhale 2 puffs into the lungs every 6 (six) hours as needed for wheezing or shortness of breath.      Ascorbic Acid 500 MG CHEW Chew by mouth.     azelastine  (ASTELIN ) 0.1 % nasal spray Place 1 spray into both nostrils daily.     Biotin 5000 MCG TABS Take by mouth daily.     blood glucose meter kit and supplies Use to check blood sugar twice daily. ICD 10: E11.29 1 each 0   Cinnamon Bark POWD Take by mouth.     COLLAGEN PO Take by mouth daily. For improved fingernail strength     docusate sodium  (COLACE) 100 MG capsule Take 1 capsule (100 mg total) by mouth 2 (two) times daily. 20 capsule 0   empagliflozin  (JARDIANCE ) 25 MG TABS tablet Take 1 tablet (25 mg total) by mouth daily before breakfast. 30 tablet 5   fluticasone  (FLONASE ) 50 MCG/ACT nasal spray Place 1 spray into both nostrils daily. 48 g 1   fluticasone  (FLOVENT  DISKUS) 50 MCG/BLIST diskus inhaler Inhale 1 puff into the lungs 2 (two) times daily.     glucose blood (ACCU-CHEK GUIDE) test strip TEST BLOOD SUGAR  TWICE DAILY 200 strip 10   Lancet Devices (ACCU-CHEK SOFTCLIX) lancets Use to test blood sugars 1 -2 times daily 1 each 3   levocetirizine (XYZAL) 5 MG tablet Take 1 tablet by mouth every evening.     losartan  (COZAAR ) 100 MG tablet TAKE 1 TABLET EVERY DAY (REPLACING LOSARTAN  HCT) 90 tablet 3   Magnesium 250 MG TABS Take 1 tablet by mouth daily.     meclizine  (ANTIVERT ) 25 MG tablet Take 1 tablet (25 mg total) by mouth 3 (three) times daily as needed for dizziness. 30 tablet 3   metFORMIN  (GLUCOPHAGE -XR) 500 MG 24 hr tablet TAKE 2 TABLETS EVERY DAY WITH BREAKFAST (DOSE INCREASE) 180 tablet 3   Multiple Vitamin (MULTIVITAMIN) tablet Take 1 tablet by mouth daily.     nitroGLYCERIN  (NITROSTAT ) 0.4 MG SL tablet Place 0.4 mg under the tongue.     Probiotic Product (PROBIOTIC-10) CAPS Take 1 capsule by mouth daily.      Semaglutide ,0.25 or 0.5MG /DOS, (OZEMPIC , 0.25 OR 0.5 MG/DOSE,) 2 MG/3ML SOPN INJECT 0.25 MG INTO THE SKIN ONCE A WEEK. 6 mL 3   verapamil  (VERELAN ) 120 MG 24 hr capsule TAKE 1 CAPSULE EVERY DAY 90 capsule 3   Vibegron  (GEMTESA ) 75 MG TABS Take 1 tablet (75 mg total) by mouth daily. 90 tablet  3   vitamin B-12 (CYANOCOBALAMIN) 1000 MCG tablet Take 1,000 mcg by mouth daily.     Zinc 50 MG TABS Take by mouth daily.     atorvastatin  (LIPITOR) 40 MG tablet TAKE 1/2 TABLET EVERY DAY  AT  6  PM 45 tablet 3   furosemide  (LASIX ) 20 MG tablet Take 1 tablet (20 mg total) by mouth 2 (two) times a week. As needed for fluid retention 45 tablet 3   pantoprazole  (PROTONIX ) 40 MG tablet Take 1 tablet (40 mg total) by mouth 2 (two) times daily. 180 tablet 3   potassium chloride  SA (KLOR-CON  M) 20 MEQ tablet TAKE 1 TABLET 2 TIMES A WEEK. 26 tablet 3   sertraline  (ZOLOFT ) 50 MG tablet Take 1 tablet (50 mg total) by mouth daily. 90 tablet 3   Cetirizine HCl 10 MG CAPS Take by mouth. (Patient not taking: Reported on 02/15/2024)     No facility-administered medications prior to visit.    Review of  Systems;  Patient denies headache, fevers, malaise, unintentional weight loss, skin rash, eye pain, sinus congestion and sinus pain, sore throat, dysphagia,  hemoptysis , cough, dyspnea, wheezing, chest pain, palpitations, orthopnea, edema, abdominal pain, nausea, melena, diarrhea, constipation, flank pain, dysuria, hematuria, urinary  Frequency, nocturia, numbness, tingling, seizures,  Focal weakness, Loss of consciousness,  Tremor, insomnia, depression, anxiety, and suicidal ideation.      Objective:  BP 130/68   Pulse 70   Ht 5' (1.524 m)   Wt 137 lb 6.4 oz (62.3 kg)   SpO2 95%   BMI 26.83 kg/m   BP Readings from Last 3 Encounters:  02/15/24 130/68  08/17/23 124/70  03/09/23 102/64    Wt Readings from Last 3 Encounters:  02/15/24 137 lb 6.4 oz (62.3 kg)  08/17/23 137 lb 3.2 oz (62.2 kg)  03/09/23 140 lb (63.5 kg)    Physical Exam  Lab Results  Component Value Date   HGBA1C 7.8 (H) 08/17/2023   HGBA1C 6.4 (A) 02/14/2023   HGBA1C 8.3 (H) 08/30/2022    Lab Results  Component Value Date   CREATININE 0.81 08/17/2023   CREATININE 0.88 02/14/2023   CREATININE 0.96 08/30/2022    Lab Results  Component Value Date   WBC 3.0 (L) 08/30/2022   HGB 13.2 08/30/2022   HCT 39.5 08/30/2022   PLT 301.0 08/30/2022   GLUCOSE 116 (H) 08/17/2023   CHOL 212 (H) 08/17/2023   TRIG 58.0 08/17/2023   HDL 103.50 08/17/2023   LDLDIRECT 72.0 08/17/2023   LDLCALC 97 08/17/2023   ALT 32 08/17/2023   AST 29 08/17/2023   NA 139 08/17/2023   K 3.7 08/17/2023   CL 104 08/17/2023   CREATININE 0.81 08/17/2023   BUN 18 08/17/2023   CO2 27 08/17/2023   TSH 3.00 08/30/2022   INR 1.1 01/23/2009   HGBA1C 7.8 (H) 08/17/2023   MICROALBUR 5 01/24/2023    US  Venous Img Lower Unilateral Right (DVT) Result Date: 07/26/2023 CLINICAL DATA:  Right lower extremity pain for the past 2 weeks. History of uterine cancer. Evaluate for DVT. EXAM: RIGHT LOWER EXTREMITY VENOUS DOPPLER ULTRASOUND  TECHNIQUE: Gray-scale sonography with graded compression, as well as color Doppler and duplex ultrasound were performed to evaluate the lower extremity deep venous systems from the level of the common femoral vein and including the common femoral, femoral, profunda femoral, popliteal and calf veins including the posterior tibial, peroneal and gastrocnemius veins when visible. The superficial great saphenous vein was also interrogated. Spectral  Doppler was utilized to evaluate flow at rest and with distal augmentation maneuvers in the common femoral, femoral and popliteal veins. COMPARISON:  None Available. FINDINGS: Contralateral Common Femoral Vein: Respiratory phasicity is normal and symmetric with the symptomatic side. No evidence of thrombus. Normal compressibility. Common Femoral Vein: No evidence of thrombus. Normal compressibility, respiratory phasicity and response to augmentation. Saphenofemoral Junction: No evidence of thrombus. Normal compressibility and flow on color Doppler imaging. Profunda Femoral Vein: No evidence of thrombus. Normal compressibility and flow on color Doppler imaging. Femoral Vein: No evidence of thrombus. Normal compressibility, respiratory phasicity and response to augmentation. Popliteal Vein: No evidence of thrombus. Normal compressibility, respiratory phasicity and response to augmentation. Calf Veins: No evidence of thrombus. Normal compressibility and flow on color Doppler imaging. Superficial Great Saphenous Vein: No evidence of thrombus. Normal compressibility. Other Findings:  None. IMPRESSION: No evidence of DVT within the right lower extremity. Electronically Signed   By: Robbi Childs M.D.   On: 07/26/2023 18:11    Assessment & Plan:  .Type 2 DM with CKD stage 3 and hypertension (HCC) -     Comprehensive metabolic panel with GFR -     Hepatic function panel  Essential hypertension -     Comprehensive metabolic panel with GFR  Hyperlipidemia associated with type 2  diabetes mellitus (HCC) -     Lipid panel -     LDL cholesterol, direct -     TSH -     Hepatic function panel  Fatigue, unspecified type  Other orders -     Atorvastatin  Calcium ; TAKE 1/2 TABLET EVERY DAY  AT  6  PM  Dispense: 45 tablet; Refill: 3 -     Furosemide ; Take 1 tablet (20 mg total) by mouth 2 (two) times a week. As needed for fluid retention  Dispense: 45 tablet; Refill: 3 -     Pantoprazole  Sodium; Take 1 tablet (40 mg total) by mouth 2 (two) times daily.  Dispense: 180 tablet; Refill: 3 -     Potassium Chloride  [place] ER; TAKE 1 TABLET 2 TIMES A WEEK.  Dispense: 26 tablet; Refill: 3 -     Sertraline  HCl; Take 1 tablet (50 mg total) by mouth daily.  Dispense: 90 tablet; Refill: 3     I spent 34 minutes on the day of this face to face encounter reviewing patient's  most recent visit with cardiology,  nephrology,  and neurology,  prior relevant surgical and non surgical procedures, recent  labs and imaging studies, counseling on weight management,  reviewing the assessment and plan with patient, and post visit ordering and reviewing of  diagnostics and therapeutics with patient  .   Follow-up: No follow-ups on file.   Thersia Flax, MD

## 2024-02-17 ENCOUNTER — Ambulatory Visit: Payer: Self-pay | Admitting: Internal Medicine

## 2024-02-19 NOTE — Assessment & Plan Note (Signed)
 She has  improved control with  ozempic , metformin , max dose  jardiance  .  Contrary to erroneous advice given to her by an So[employer] scientist,  she should not be taking Januvia  concurrently with GKP 1 agonist (which is why januvia  was discontinued  when Ozempic  was resumed)  .  Advised to    continue  Jardiance , ozempic  and metformin .  CBG monitor placed to collect more data  for future start of  insulin  therapy .  She has no microalbuminuria .  She HAS been taking losartan  100 mg daily   Lab Results  Component Value Date   HGBA1C 7.4 (H) 02/15/2024   Lab Results  Component Value Date   LABMICR See below: 07/17/2023   LABMICR See below: 01/23/2023   MICROALBUR 5 01/24/2023   MICROALBUR 1.0 11/15/2021   Lab Results  Component Value Date   CHOL 238 (H) 02/15/2024   HDL 131.80 02/15/2024   LDLCALC 95 02/15/2024   LDLDIRECT 73.0 02/15/2024   TRIG 59.0 02/15/2024   CHOLHDL 2 02/15/2024

## 2024-02-22 DIAGNOSIS — M5412 Radiculopathy, cervical region: Secondary | ICD-10-CM | POA: Diagnosis not present

## 2024-02-29 ENCOUNTER — Ambulatory Visit: Payer: Self-pay | Admitting: *Deleted

## 2024-02-29 NOTE — Telephone Encounter (Signed)
 Spoke to pt relayed message per Dr Madelon Scheuermann, pt verbalized understanding

## 2024-02-29 NOTE — Telephone Encounter (Signed)
 FYI Only or Action Required?: Action required by provider  Patient was last seen in primary care on 02/15/2024 by Thersia Flax, MD. Called Nurse Triage reporting Hyperglycemia. Symptoms began several days ago. Interventions attempted: Prescription medications: Patient is using prescribed Rx for diabetes: metformin , jardiance , ozempic . Symptoms are: caused by Prednisone  Rx- patient has 3 more doses to take- requests PCP direction.  Triage Disposition: Call PCP Now  Patient/caregiver understands and will follow disposition?: yes

## 2024-02-29 NOTE — Telephone Encounter (Signed)
       Reason for Disposition  [1] Blood glucose > 300 mg/dL (16.1 mmol/L) AND [0] two or more times in a row  Answer Assessment - Initial Assessment Questions Patient is taking Prednisone - wants advice: 1. BLOOD GLUCOSE: "What is your blood glucose level?"      204-now,  patient is getting 300 daily- especially at night 2. ONSET: "When did you check the blood glucose?"     1:15 3. USUAL RANGE: "What is your glucose level usually?" (e.g., usual fasting morning value, usual evening value)     Patient normal range 150 (223-since taking medication)- patient has been using Prednisone  for neck- she started Sunday-glucose levels have been high- fasting/after meals 4. KETONES: "Do you check for ketones (urine or blood test strips)?" If Yes, ask: "What does the test show now?"      na 5. TYPE 1 or 2:  "Do you know what type of diabetes you have?"  (e.g., Type 1, Type 2, Gestational; doesn't know)      Type 2 6. INSULIN : "Do you take insulin ?" "What type of insulin (s) do you use? What is the mode of delivery? (syringe, pen; injection or pump)?"      Ozempic   7. DIABETES PILLS: "Do you take any pills for your diabetes?" If Yes, ask: "Have you missed taking any pills recently?"     Metformin ,jardiance  8. OTHER SYMPTOMS: "Do you have any symptoms?" (e.g., fever, frequent urination, difficulty breathing, dizziness, weakness, vomiting)     no Patient has 2 days of prednisone  left  Protocols used: Diabetes - High Blood Sugar-A-AH    Copied from CRM [id number]. Topic: Clinical - Red Word Triage >> Feb 29, 2024  1:12 PM Sandra Hendrix wrote: Red Word that prompted transfer to Nurse Triage: Blood sugar has been in 300's and would like to know what to do other than take her medicine

## 2024-03-12 ENCOUNTER — Telehealth: Payer: Self-pay

## 2024-03-12 NOTE — Telephone Encounter (Signed)
 Copied from CRM [id number]. Topic: General - Other >> Mar 12, 2024  8:42 AM Jenice Mitts wrote: Reason for CRM: Patient is calling to make sure she still has her AWV phone call at 810am  I left voicemail for patient letting her know we received her message from our call center.  I let her know it looks like her appointment for her AWV with our Nurse Health Advisor via telephone this morning was cancelled, but she does have an in-office visit with Dr. Creta Dolin on 03/14/2024 at 12:00pm.  I asked her to please call us  back to reschedule her AWV.

## 2024-03-14 ENCOUNTER — Ambulatory Visit: Admitting: Internal Medicine

## 2024-03-14 ENCOUNTER — Encounter: Payer: Self-pay | Admitting: Internal Medicine

## 2024-03-14 VITALS — BP 114/68 | HR 71 | Temp 97.9°F | Ht 60.0 in | Wt 137.2 lb

## 2024-03-14 DIAGNOSIS — N183 Chronic kidney disease, stage 3 unspecified: Secondary | ICD-10-CM | POA: Diagnosis not present

## 2024-03-14 DIAGNOSIS — E1122 Type 2 diabetes mellitus with diabetic chronic kidney disease: Secondary | ICD-10-CM

## 2024-03-14 DIAGNOSIS — K2270 Barrett's esophagus without dysplasia: Secondary | ICD-10-CM | POA: Diagnosis not present

## 2024-03-14 DIAGNOSIS — E785 Hyperlipidemia, unspecified: Secondary | ICD-10-CM

## 2024-03-14 DIAGNOSIS — Z7985 Long-term (current) use of injectable non-insulin antidiabetic drugs: Secondary | ICD-10-CM | POA: Diagnosis not present

## 2024-03-14 DIAGNOSIS — K760 Fatty (change of) liver, not elsewhere classified: Secondary | ICD-10-CM | POA: Diagnosis not present

## 2024-03-14 DIAGNOSIS — I129 Hypertensive chronic kidney disease with stage 1 through stage 4 chronic kidney disease, or unspecified chronic kidney disease: Secondary | ICD-10-CM | POA: Diagnosis not present

## 2024-03-14 DIAGNOSIS — Z7984 Long term (current) use of oral hypoglycemic drugs: Secondary | ICD-10-CM

## 2024-03-14 DIAGNOSIS — E1169 Type 2 diabetes mellitus with other specified complication: Secondary | ICD-10-CM

## 2024-03-14 MED ORDER — GLIPIZIDE 5 MG PO TABS
ORAL_TABLET | ORAL | 3 refills | Status: DC
Start: 2024-03-14 — End: 2024-06-24

## 2024-03-14 NOTE — Progress Notes (Addendum)
 Subjective:  Patient ID: Sandra Hendrix, female    DOB: Apr 01, 1946  Age: 78 y.o. MRN: [id number]  CC: The primary encounter diagnosis was Barrett's esophagus without dysplasia. Diagnoses of Hepatic steatosis, Hyperlipidemia associated with type 2 diabetes mellitus (HCC), and Type 2 DM with CKD stage 3 and hypertension (HCC) were also pertinent to this visit.   [name] presents for  Chief Complaint  Patient presents with   Medical Management of Chronic Issues   One month follow up on type 2 DM:  at last visit on May 22 a CBG monitor was placed on patient's arm  .I have downloaded and reviewed the data from patient's continuous blood glucose monitor  for  the period  of    may 24 to June 4. Patient's  sugars have been  IN RANGE  69   % OF THE TIME,   ABOVE RANGE 31   % of the time.  And  BELOW  RANGE  0 % OF THE TIME .  Medications currently used for glycemic control are  ozempic  0.25 mg weekly metformin  xr 1000 mg daily and jardiance  25 mg daily .  Patient reports compliance with medication approximately  20  % of the time.    Evening blood sugars have been elevated to over 250 on many days for 4 to 6 hours.  She recalls many nights of dinners that included either cake or several servings of starch , which is part of her cultural cuisine.   .     Outpatient Medications Prior to Visit  Medication Sig Dispense Refill   acetaminophen  (TYLENOL ) 500 MG tablet Take 2 tablets (1,000 mg total) by mouth every 6 (six) hours. (Patient taking differently: Take 1,000 mg by mouth every 6 (six) hours as needed.) 65 tablet 1   albuterol  (PROVENTIL  HFA;VENTOLIN  HFA) 108 (90 BASE) MCG/ACT inhaler Inhale 2 puffs into the lungs every 6 (six) hours as needed for wheezing or shortness of breath.      Ascorbic Acid 500 MG CHEW Chew by mouth.     atorvastatin  (LIPITOR) 40 MG tablet TAKE 1/2 TABLET EVERY DAY  AT  6  PM 45 tablet 3   azelastine  (ASTELIN ) 0.1 % nasal spray Place 1 spray into both  nostrils daily.     Biotin 5000 MCG TABS Take by mouth daily.     blood glucose meter kit and supplies Use to check blood sugar twice daily. ICD 10: E11.29 1 each 0   Cinnamon Bark POWD Take by mouth.     COLLAGEN PO Take by mouth daily. For improved fingernail strength     docusate sodium  (COLACE) 100 MG capsule Take 1 capsule (100 mg total) by mouth 2 (two) times daily. 20 capsule 0   empagliflozin  (JARDIANCE ) 25 MG TABS tablet Take 1 tablet (25 mg total) by mouth daily before breakfast. 30 tablet 5   fluticasone  (FLONASE ) 50 MCG/ACT nasal spray Place 1 spray into both nostrils daily. 48 g 1   fluticasone  (FLOVENT  DISKUS) 50 MCG/BLIST diskus inhaler Inhale 1 puff into the lungs 2 (two) times daily.     furosemide  (LASIX ) 20 MG tablet Take 1 tablet (20 mg total) by mouth 2 (two) times a week. As needed for fluid retention 45 tablet 3   glucose blood (ACCU-CHEK GUIDE) test strip TEST BLOOD SUGAR TWICE DAILY 200 strip 10   Lancet Devices (ACCU-CHEK SOFTCLIX) lancets Use to test blood sugars 1 -2 times daily 1 each 3  levocetirizine (XYZAL) 5 MG tablet Take 1 tablet by mouth every evening.     losartan  (COZAAR ) 100 MG tablet TAKE 1 TABLET EVERY DAY (REPLACING LOSARTAN  HCT) 90 tablet 3   Magnesium 250 MG TABS Take 1 tablet by mouth daily.     meclizine  (ANTIVERT ) 25 MG tablet Take 1 tablet (25 mg total) by mouth 3 (three) times daily as needed for dizziness. 30 tablet 3   metFORMIN  (GLUCOPHAGE -XR) 500 MG 24 hr tablet TAKE 2 TABLETS EVERY DAY WITH BREAKFAST (DOSE INCREASE) 180 tablet 3   Multiple Vitamin (MULTIVITAMIN) tablet Take 1 tablet by mouth daily.     nitroGLYCERIN  (NITROSTAT ) 0.4 MG SL tablet Place 0.4 mg under the tongue.     pantoprazole  (PROTONIX ) 40 MG tablet Take 1 tablet (40 mg total) by mouth 2 (two) times daily. 180 tablet 3   potassium chloride  SA (KLOR-CON  M) 20 MEQ tablet TAKE 1 TABLET 2 TIMES A WEEK. 26 tablet 3   Probiotic Product (PROBIOTIC-10) CAPS Take 1 capsule by mouth  daily.      Semaglutide ,0.25 or 0.5MG /DOS, (OZEMPIC , 0.25 OR 0.5 MG/DOSE,) 2 MG/3ML SOPN INJECT 0.25 MG INTO THE SKIN ONCE A WEEK. 6 mL 3   sertraline  (ZOLOFT ) 50 MG tablet Take 1 tablet (50 mg total) by mouth daily. 90 tablet 3   verapamil  (VERELAN ) 120 MG 24 hr capsule TAKE 1 CAPSULE EVERY DAY 90 capsule 3   Vibegron  (GEMTESA ) 75 MG TABS Take 1 tablet (75 mg total) by mouth daily. 90 tablet 3   vitamin B-12 (CYANOCOBALAMIN) 1000 MCG tablet Take 1,000 mcg by mouth daily.     Zinc 50 MG TABS Take by mouth daily.     No facility-administered medications prior to visit.    Review of Systems;  Patient denies headache, fevers, malaise, unintentional weight loss, skin rash, eye pain, sinus congestion and sinus pain, sore throat, dysphagia,  hemoptysis , cough, dyspnea, wheezing, chest pain, palpitations, orthopnea, edema, abdominal pain, nausea, melena, diarrhea, constipation, flank pain, dysuria, hematuria, urinary  Frequency, nocturia, numbness, tingling, seizures,  Focal weakness, Loss of consciousness,  Tremor, insomnia, depression, anxiety, and suicidal ideation.      Objective:  BP 114/68   Pulse 71   Temp 97.9 F (36.6 C)   Ht 5' (1.524 m)   Wt 137 lb 3.2 oz (62.2 kg)   SpO2 96%   BMI 26.80 kg/m   BP Readings from Last 3 Encounters:  03/14/24 114/68  02/15/24 130/68  08/17/23 124/70    Wt Readings from Last 3 Encounters:  03/14/24 137 lb 3.2 oz (62.2 kg)  02/15/24 137 lb 6.4 oz (62.3 kg)  08/17/23 137 lb 3.2 oz (62.2 kg)    Physical Exam Vitals reviewed.  Constitutional:      General: She is not in acute distress.    Appearance: Normal appearance. She is normal weight. She is not ill-appearing, toxic-appearing or diaphoretic.  HENT:     Head: Normocephalic.   Eyes:     General: No scleral icterus.       Right eye: No discharge.        Left eye: No discharge.     Conjunctiva/sclera: Conjunctivae normal.    Cardiovascular:     Rate and Rhythm: Normal rate and  regular rhythm.     Heart sounds: Normal heart sounds.  Pulmonary:     Effort: Pulmonary effort is normal. No respiratory distress.     Breath sounds: Normal breath sounds.   Musculoskeletal:  General: Normal range of motion.   Skin:    General: Skin is warm and dry.   Neurological:     General: No focal deficit present.     Mental Status: She is alert and oriented to person, place, and time. Mental status is at baseline.   Psychiatric:        Mood and Affect: Mood normal.        Behavior: Behavior normal.        Thought Content: Thought content normal.        Judgment: Judgment normal.    Lab Results  Component Value Date   HGBA1C 7.4 (H) 02/15/2024   HGBA1C 7.8 (H) 08/17/2023   HGBA1C 6.4 (A) 02/14/2023    Lab Results  Component Value Date   CREATININE 0.79 02/15/2024   CREATININE 0.81 08/17/2023   CREATININE 0.88 02/14/2023    Lab Results  Component Value Date   WBC 3.0 (L) 08/30/2022   HGB 13.2 08/30/2022   HCT 39.5 08/30/2022   PLT 301.0 08/30/2022   GLUCOSE 122 (H) 02/15/2024   CHOL 238 (H) 02/15/2024   TRIG 59.0 02/15/2024   HDL 131.80 02/15/2024   LDLDIRECT 73.0 02/15/2024   LDLCALC 95 02/15/2024   ALT 24 02/15/2024   ALT 24 02/15/2024   AST 26 02/15/2024   AST 26 02/15/2024   NA 139 02/15/2024   K 4.1 02/15/2024   CL 104 02/15/2024   CREATININE 0.79 02/15/2024   BUN 23 02/15/2024   CO2 29 02/15/2024   TSH 2.49 02/15/2024   INR 1.1 01/23/2009   HGBA1C 7.4 (H) 02/15/2024   MICROALBUR 5 01/24/2023    US  Venous Img Lower Unilateral Right (DVT) Result Date: 07/26/2023 CLINICAL DATA:  Right lower extremity pain for the past 2 weeks. History of uterine cancer. Evaluate for DVT. EXAM: RIGHT LOWER EXTREMITY VENOUS DOPPLER ULTRASOUND TECHNIQUE: Gray-scale sonography with graded compression, as well as color Doppler and duplex ultrasound were performed to evaluate the lower extremity deep venous systems from the level of the common femoral vein  and including the common femoral, femoral, profunda femoral, popliteal and calf veins including the posterior tibial, peroneal and gastrocnemius veins when visible. The superficial great saphenous vein was also interrogated. Spectral Doppler was utilized to evaluate flow at rest and with distal augmentation maneuvers in the common femoral, femoral and popliteal veins. COMPARISON:  None Available. FINDINGS: Contralateral Common Femoral Vein: Respiratory phasicity is normal and symmetric with the symptomatic side. No evidence of thrombus. Normal compressibility. Common Femoral Vein: No evidence of thrombus. Normal compressibility, respiratory phasicity and response to augmentation. Saphenofemoral Junction: No evidence of thrombus. Normal compressibility and flow on color Doppler imaging. Profunda Femoral Vein: No evidence of thrombus. Normal compressibility and flow on color Doppler imaging. Femoral Vein: No evidence of thrombus. Normal compressibility, respiratory phasicity and response to augmentation. Popliteal Vein: No evidence of thrombus. Normal compressibility, respiratory phasicity and response to augmentation. Calf Veins: No evidence of thrombus. Normal compressibility and flow on color Doppler imaging. Superficial Great Saphenous Vein: No evidence of thrombus. Normal compressibility. Other Findings:  None. IMPRESSION: No evidence of DVT within the right lower extremity. Electronically Signed   By: Norleen Roulette M.D.   On: 07/26/2023 18:11    Assessment & Plan:  .Barrett's esophagus without dysplasia Assessment & Plan: Resolved by 2015.  Last EGD dine in 2022 noted gastritis,  H pylori negaive, and a Hiatal hernia.   Reminded to continue daily use of PPI  in the morning  upon waking    Hepatic steatosis Assessment & Plan: Presumed by ultrasound changes and serologies negative for autoimmune causes of hepatitis. liver enzymes have been  normal and all modifiable risk factors including obesity, diabetes  and hyperlipidemia have been addressed . Continue Ozempic , metformin  and atorvastatin .  She has achieved significant weight loss with initiation of  Ozempic    Lab Results  Component Value Date   ALT 24 02/15/2024   ALT 24 02/15/2024   AST 26 02/15/2024   AST 26 02/15/2024   ALKPHOS 109 02/15/2024   ALKPHOS 109 02/15/2024   BILITOT 0.6 02/15/2024   BILITOT 0.6 02/15/2024       Hyperlipidemia associated with type 2 diabetes mellitus (HCC) Assessment & Plan: She has aortic atherosclerosis.  She is tolerating asa, atorvastatin  ; LFTs are normal. LDL is at goal of 70   Lab Results  Component Value Date   CHOL 238 (H) 02/15/2024   HDL 131.80 02/15/2024   LDLCALC 95 02/15/2024   LDLDIRECT 73.0 02/15/2024   TRIG 59.0 02/15/2024   CHOLHDL 2 02/15/2024   Lab Results  Component Value Date   ALT 24 02/15/2024   ALT 24 02/15/2024   AST 26 02/15/2024   AST 26 02/15/2024   ALKPHOS 109 02/15/2024   ALKPHOS 109 02/15/2024   BILITOT 0.6 02/15/2024   BILITOT 0.6 02/15/2024      Type 2 DM with CKD stage 3 and hypertension (HCC) Assessment & Plan: CBG monitor was placed due to loss of control to collect more data .  Her evenign blood sugars are chronically high due to her culture cuisine .  Adding glipizide  2.5 mg at dinnertime,  as she has deferred insulin  therapy   She has no microalbuminuria .  She HAS been taking losartan  100 mg daily   Lab Results  Component Value Date   HGBA1C 7.4 (H) 02/15/2024   Lab Results  Component Value Date   LABMICR See below: 07/17/2023   LABMICR See below: 01/23/2023   MICROALBUR 5 01/24/2023   MICROALBUR 1.0 11/15/2021   Lab Results  Component Value Date   CHOL 238 (H) 02/15/2024   HDL 131.80 02/15/2024   LDLCALC 95 02/15/2024   LDLDIRECT 73.0 02/15/2024   TRIG 59.0 02/15/2024   CHOLHDL 2 02/15/2024      Other orders -     glipiZIDE ; 1/2 tablet before dinner as needed for  Dispense: 30 tablet; Refill: 3     I Spent 30 minutes of  this face to face encounter reviewing patient's  most recent visit with cardiology,    prior relevant surgical and non surgical procedures, recent  labs and imaging studies, reviewing blood sugars and counseling on glycemic  management,  reviewing the assessment and plan with patient, and post visit ordering and reviewing of  diagnostics and therapeutics with patient  .   Follow-up: No follow-ups on file.   Sandra LITTIE Kettering, MD

## 2024-03-14 NOTE — Patient Instructions (Addendum)
 We are adding 2.5 mg of glipizide  at dinner on the nights you are going to eat out or eat [place] (because of the starches)   Use the [employer] app on your phone to document what you had for dinner so we can correlate the meal with the data  You can take both metformin  tablets together

## 2024-03-16 NOTE — Assessment & Plan Note (Signed)
 Presumed by ultrasound changes and serologies negative for autoimmune causes of hepatitis. liver enzymes have been  normal and all modifiable risk factors including obesity, diabetes and hyperlipidemia have been addressed . Continue Ozempic , metformin  and atorvastatin .  She has achieved significant weight loss with initiation of  Ozempic    Lab Results  Component Value Date   ALT 24 02/15/2024   ALT 24 02/15/2024   AST 26 02/15/2024   AST 26 02/15/2024   ALKPHOS 109 02/15/2024   ALKPHOS 109 02/15/2024   BILITOT 0.6 02/15/2024   BILITOT 0.6 02/15/2024

## 2024-03-16 NOTE — Assessment & Plan Note (Signed)
 CBG monitor was placed due to loss of control to collect more data .  Her evenign blood sugars are chronically high due to her culture cuisine .  Adding glipizide  2.5 mg at dinnertime,  as she has deferred insulin  therapy   She has no microalbuminuria .  She HAS been taking losartan  100 mg daily   Lab Results  Component Value Date   HGBA1C 7.4 (H) 02/15/2024   Lab Results  Component Value Date   LABMICR See below: 07/17/2023   LABMICR See below: 01/23/2023   MICROALBUR 5 01/24/2023   MICROALBUR 1.0 11/15/2021   Lab Results  Component Value Date   CHOL 238 (H) 02/15/2024   HDL 131.80 02/15/2024   LDLCALC 95 02/15/2024   LDLDIRECT 73.0 02/15/2024   TRIG 59.0 02/15/2024   CHOLHDL 2 02/15/2024

## 2024-03-16 NOTE — Assessment & Plan Note (Signed)
 She has aortic atherosclerosis.  She is tolerating asa, atorvastatin  ; LFTs are normal. LDL is at goal of 70   Lab Results  Component Value Date   CHOL 238 (H) 02/15/2024   HDL 131.80 02/15/2024   LDLCALC 95 02/15/2024   LDLDIRECT 73.0 02/15/2024   TRIG 59.0 02/15/2024   CHOLHDL 2 02/15/2024   Lab Results  Component Value Date   ALT 24 02/15/2024   ALT 24 02/15/2024   AST 26 02/15/2024   AST 26 02/15/2024   ALKPHOS 109 02/15/2024   ALKPHOS 109 02/15/2024   BILITOT 0.6 02/15/2024   BILITOT 0.6 02/15/2024

## 2024-03-16 NOTE — Assessment & Plan Note (Addendum)
 Resolved by 2015.  Last EGD dine in 2022 noted gastritis,  H pylori negaive, and a Hiatal hernia.   Reminded to continue daily use of PPI  in the morning upon waking

## 2024-03-28 ENCOUNTER — Encounter: Payer: Self-pay | Admitting: Internal Medicine

## 2024-03-28 ENCOUNTER — Ambulatory Visit: Admitting: Internal Medicine

## 2024-03-28 VITALS — BP 120/62 | HR 77 | Ht 60.0 in | Wt 137.4 lb

## 2024-03-28 DIAGNOSIS — N183 Chronic kidney disease, stage 3 unspecified: Secondary | ICD-10-CM

## 2024-03-28 DIAGNOSIS — I129 Hypertensive chronic kidney disease with stage 1 through stage 4 chronic kidney disease, or unspecified chronic kidney disease: Secondary | ICD-10-CM

## 2024-03-28 DIAGNOSIS — Z7985 Long-term (current) use of injectable non-insulin antidiabetic drugs: Secondary | ICD-10-CM | POA: Diagnosis not present

## 2024-03-28 DIAGNOSIS — E1122 Type 2 diabetes mellitus with diabetic chronic kidney disease: Secondary | ICD-10-CM

## 2024-03-28 DIAGNOSIS — Z7984 Long term (current) use of oral hypoglycemic drugs: Secondary | ICD-10-CM

## 2024-03-28 LAB — MICROALBUMIN / CREATININE URINE RATIO
Creatinine,U: 55.1 mg/dL
Microalb Creat Ratio: 20 mg/g (ref 0.0–30.0)
Microalb, Ur: 1.1 mg/dL (ref 0.0–1.9)

## 2024-03-28 NOTE — Progress Notes (Signed)
 Subjective:  Patient ID: Sandra Hendrix, female    DOB: 1945/12/08  Age: 78 y.o. MRN: [id number]  CC: The encounter diagnosis was Type 2 DM with CKD stage 3 and hypertension (HCC).   [name] presents for  Chief Complaint  Patient presents with   Medical Management of Chronic Issues    Follow up on diabetes    TYPE 2 DM:  one month follow up.  Glipizide  2.5 mg was restarted  for management of evening hyperglycemia. I have downloaded and reviewed the data from patient's /freestyle libre 3 continuous blood glucose monitor  for  the period  of June 20 to July 3   Patient's  sugars have been  IN RANGE  92   % OF THE TIME,   ABOVE RANGE  7  % of the time.  And  BELOW  RANGE  1 % OF THE TIME .  However patient's blood sugars most evenings show a trend down to 77 by 4 am .even with prn use of glipizide  .    Patient reports decreased appetite in the evening due to use of 0. 25 mg ozempic  .  She reports compliance with regimen of metformin , jardiance  and ozempic  medication 100% of the time.     Outpatient Medications Prior to Visit  Medication Sig Dispense Refill   acetaminophen  (TYLENOL ) 500 MG tablet Take 2 tablets (1,000 mg total) by mouth every 6 (six) hours. (Patient taking differently: Take 1,000 mg by mouth every 6 (six) hours as needed.) 65 tablet 1   albuterol  (PROVENTIL  HFA;VENTOLIN  HFA) 108 (90 BASE) MCG/ACT inhaler Inhale 2 puffs into the lungs every 6 (six) hours as needed for wheezing or shortness of breath.      Ascorbic Acid 500 MG CHEW Chew by mouth.     atorvastatin  (LIPITOR) 40 MG tablet TAKE 1/2 TABLET EVERY DAY  AT  6  PM 45 tablet 3   azelastine  (ASTELIN ) 0.1 % nasal spray Place 1 spray into both nostrils daily.     Biotin 5000 MCG TABS Take by mouth daily.     blood glucose meter kit and supplies Use to check blood sugar twice daily. ICD 10: E11.29 1 each 0   Cinnamon Bark POWD Take by mouth.     COLLAGEN PO Take by mouth daily. For improved fingernail  strength     docusate sodium  (COLACE) 100 MG capsule Take 1 capsule (100 mg total) by mouth 2 (two) times daily. 20 capsule 0   empagliflozin  (JARDIANCE ) 25 MG TABS tablet Take 1 tablet (25 mg total) by mouth daily before breakfast. 30 tablet 5   fluticasone  (FLONASE ) 50 MCG/ACT nasal spray Place 1 spray into both nostrils daily. 48 g 1   fluticasone  (FLOVENT  DISKUS) 50 MCG/BLIST diskus inhaler Inhale 1 puff into the lungs 2 (two) times daily.     furosemide  (LASIX ) 20 MG tablet Take 1 tablet (20 mg total) by mouth 2 (two) times a week. As needed for fluid retention 45 tablet 3   glipiZIDE  (GLUCOTROL ) 5 MG tablet 1/2 tablet before dinner as needed for 30 tablet 3   glucose blood (ACCU-CHEK GUIDE) test strip TEST BLOOD SUGAR TWICE DAILY 200 strip 10   Lancet Devices (ACCU-CHEK SOFTCLIX) lancets Use to test blood sugars 1 -2 times daily 1 each 3   levocetirizine (XYZAL) 5 MG tablet Take 1 tablet by mouth every evening.     losartan  (COZAAR ) 100 MG tablet TAKE 1 TABLET EVERY DAY (REPLACING LOSARTAN   HCT) 90 tablet 3   Magnesium 250 MG TABS Take 1 tablet by mouth daily.     meclizine  (ANTIVERT ) 25 MG tablet Take 1 tablet (25 mg total) by mouth 3 (three) times daily as needed for dizziness. 30 tablet 3   metFORMIN  (GLUCOPHAGE -XR) 500 MG 24 hr tablet TAKE 2 TABLETS EVERY DAY WITH BREAKFAST (DOSE INCREASE) 180 tablet 3   Multiple Vitamin (MULTIVITAMIN) tablet Take 1 tablet by mouth daily.     nitroGLYCERIN  (NITROSTAT ) 0.4 MG SL tablet Place 0.4 mg under the tongue.     pantoprazole  (PROTONIX ) 40 MG tablet Take 1 tablet (40 mg total) by mouth 2 (two) times daily. 180 tablet 3   potassium chloride  SA (KLOR-CON  M) 20 MEQ tablet TAKE 1 TABLET 2 TIMES A WEEK. 26 tablet 3   Probiotic Product (PROBIOTIC-10) CAPS Take 1 capsule by mouth daily.      Semaglutide ,0.25 or 0.5MG /DOS, (OZEMPIC , 0.25 OR 0.5 MG/DOSE,) 2 MG/3ML SOPN INJECT 0.25 MG INTO THE SKIN ONCE A WEEK. 6 mL 3   sertraline  (ZOLOFT ) 50 MG tablet Take  1 tablet (50 mg total) by mouth daily. 90 tablet 3   verapamil  (VERELAN ) 120 MG 24 hr capsule TAKE 1 CAPSULE EVERY DAY 90 capsule 3   Vibegron  (GEMTESA ) 75 MG TABS Take 1 tablet (75 mg total) by mouth daily. 90 tablet 3   vitamin B-12 (CYANOCOBALAMIN) 1000 MCG tablet Take 1,000 mcg by mouth daily.     Zinc 50 MG TABS Take by mouth daily.     No facility-administered medications prior to visit.    Review of Systems;  Patient denies headache, fevers, malaise, unintentional weight loss, skin rash, eye pain, sinus congestion and sinus pain, sore throat, dysphagia,  hemoptysis , cough, dyspnea, wheezing, chest pain, palpitations, orthopnea, edema, abdominal pain, nausea, melena, diarrhea, constipation, flank pain, dysuria, hematuria, urinary  Frequency, nocturia, numbness, tingling, seizures,  Focal weakness, Loss of consciousness,  Tremor, insomnia, depression, anxiety, and suicidal ideation.      Objective:  BP 120/62   Pulse 77   Ht 5' (1.524 m)   Wt 137 lb 6.4 oz (62.3 kg)   SpO2 95%   BMI 26.83 kg/m   BP Readings from Last 3 Encounters:  03/28/24 120/62  03/14/24 114/68  02/15/24 130/68    Wt Readings from Last 3 Encounters:  03/28/24 137 lb 6.4 oz (62.3 kg)  03/14/24 137 lb 3.2 oz (62.2 kg)  02/15/24 137 lb 6.4 oz (62.3 kg)    Physical Exam Vitals reviewed.  Constitutional:      General: She is not in acute distress.    Appearance: Normal appearance. She is normal weight. She is not ill-appearing, toxic-appearing or diaphoretic.  HENT:     Head: Normocephalic.  Eyes:     General: No scleral icterus.       Right eye: No discharge.        Left eye: No discharge.     Conjunctiva/sclera: Conjunctivae normal.  Cardiovascular:     Rate and Rhythm: Normal rate and regular rhythm.     Heart sounds: Normal heart sounds.  Pulmonary:     Effort: Pulmonary effort is normal. No respiratory distress.     Breath sounds: Normal breath sounds.  Musculoskeletal:        General:  Normal range of motion.  Skin:    General: Skin is warm and dry.  Neurological:     General: No focal deficit present.     Mental Status: She is  alert and oriented to person, place, and time. Mental status is at baseline.  Psychiatric:        Mood and Affect: Mood normal.        Behavior: Behavior normal.        Thought Content: Thought content normal.        Judgment: Judgment normal.     Lab Results  Component Value Date   HGBA1C 7.4 (H) 02/15/2024   HGBA1C 7.8 (H) 08/17/2023   HGBA1C 6.4 (A) 02/14/2023    Lab Results  Component Value Date   CREATININE 0.79 02/15/2024   CREATININE 0.81 08/17/2023   CREATININE 0.88 02/14/2023    Lab Results  Component Value Date   WBC 3.0 (L) 08/30/2022   HGB 13.2 08/30/2022   HCT 39.5 08/30/2022   PLT 301.0 08/30/2022   GLUCOSE 122 (H) 02/15/2024   CHOL 238 (H) 02/15/2024   TRIG 59.0 02/15/2024   HDL 131.80 02/15/2024   LDLDIRECT 73.0 02/15/2024   LDLCALC 95 02/15/2024   ALT 24 02/15/2024   ALT 24 02/15/2024   AST 26 02/15/2024   AST 26 02/15/2024   NA 139 02/15/2024   K 4.1 02/15/2024   CL 104 02/15/2024   CREATININE 0.79 02/15/2024   BUN 23 02/15/2024   CO2 29 02/15/2024   TSH 2.49 02/15/2024   INR 1.1 01/23/2009   HGBA1C 7.4 (H) 02/15/2024   MICROALBUR 5 01/24/2023    US  Venous Img Lower Unilateral Right (DVT) Result Date: 07/26/2023 CLINICAL DATA:  Right lower extremity pain for the past 2 weeks. History of uterine cancer. Evaluate for DVT. EXAM: RIGHT LOWER EXTREMITY VENOUS DOPPLER ULTRASOUND TECHNIQUE: Gray-scale sonography with graded compression, as well as color Doppler and duplex ultrasound were performed to evaluate the lower extremity deep venous systems from the level of the common femoral vein and including the common femoral, femoral, profunda femoral, popliteal and calf veins including the posterior tibial, peroneal and gastrocnemius veins when visible. The superficial great saphenous vein was also  interrogated. Spectral Doppler was utilized to evaluate flow at rest and with distal augmentation maneuvers in the common femoral, femoral and popliteal veins. COMPARISON:  None Available. FINDINGS: Contralateral Common Femoral Vein: Respiratory phasicity is normal and symmetric with the symptomatic side. No evidence of thrombus. Normal compressibility. Common Femoral Vein: No evidence of thrombus. Normal compressibility, respiratory phasicity and response to augmentation. Saphenofemoral Junction: No evidence of thrombus. Normal compressibility and flow on color Doppler imaging. Profunda Femoral Vein: No evidence of thrombus. Normal compressibility and flow on color Doppler imaging. Femoral Vein: No evidence of thrombus. Normal compressibility, respiratory phasicity and response to augmentation. Popliteal Vein: No evidence of thrombus. Normal compressibility, respiratory phasicity and response to augmentation. Calf Veins: No evidence of thrombus. Normal compressibility and flow on color Doppler imaging. Superficial Great Saphenous Vein: No evidence of thrombus. Normal compressibility. Other Findings:  None. IMPRESSION: No evidence of DVT within the right lower extremity. Electronically Signed   By: Norleen Roulette M.D.   On: 07/26/2023 18:11    Assessment & Plan:  .Type 2 DM with CKD stage 3 and hypertension (HCC) Assessment & Plan: Improved control with restarting  glipizide  2.5 mg daily.  Will change administration to lucnhtime instead of  at dinnertime,  given recurrent hypoglycemia occurring after midnight.   She prefers to avoid  insulin  therapy   She has no microalbuminuria .  She HAS been taking losartan  100 mg daily   Lab Results  Component Value Date   HGBA1C  7.4 (H) 02/15/2024   Lab Results  Component Value Date   LABMICR See below: 07/17/2023   LABMICR See below: 01/23/2023   MICROALBUR 5 01/24/2023   Lab Results  Component Value Date   CHOL 238 (H) 02/15/2024   HDL 131.80 02/15/2024    LDLCALC 95 02/15/2024   LDLDIRECT 73.0 02/15/2024   TRIG 59.0 02/15/2024   CHOLHDL 2 02/15/2024     Orders: -     Microalbumin / creatinine urine ratio     Follow-up: No follow-ups on file.   Sandra LITTIE Kettering, MD

## 2024-03-28 NOTE — Assessment & Plan Note (Signed)
 Improved control with restarting  glipizide  2.5 mg daily.  Will change administration to lucnhtime instead of  at dinnertime,  given recurrent hypoglycemia occurring after midnight.   She prefers to avoid  insulin  therapy   She has no microalbuminuria .  She HAS been taking losartan  100 mg daily   Lab Results  Component Value Date   HGBA1C 7.4 (H) 02/15/2024   Lab Results  Component Value Date   LABMICR See below: 07/17/2023   LABMICR See below: 01/23/2023   MICROALBUR 5 01/24/2023   Lab Results  Component Value Date   CHOL 238 (H) 02/15/2024   HDL 131.80 02/15/2024   LDLCALC 95 02/15/2024   LDLDIRECT 73.0 02/15/2024   TRIG 59.0 02/15/2024   CHOLHDL 2 02/15/2024

## 2024-03-28 NOTE — Patient Instructions (Addendum)
 Change the glipizide  to lunchtime instead of dinner time  to stop the overnight lows.   Send me a message in 2 weeks  to review your sugars.    If sugars continue to drop ,  stop the glipizide  and le me know

## 2024-04-02 ENCOUNTER — Ambulatory Visit: Payer: Self-pay

## 2024-04-02 NOTE — Telephone Encounter (Signed)
 Is it okay to give pt a different sensor to see if that will fix the issue.

## 2024-04-02 NOTE — Telephone Encounter (Deleted)
 Additional Information . Commented on: All Negative - Call PCP Within 24 Hours    Patient needs instruction for [facility] Device to ensure accurate maintenance of BG level  Answer Assessment - Initial Assessment Questions 1. REASON FOR CALL or QUESTION: What is your reason for calling today? or How can I best help you? or What question do you have that I can help answer?     Patient concerned that her Freestyle Herlene is malfunctioning. Patient says that last night she got a critical alert of 77, howwer she had no symptoms. Pt says she did a finger prick and her Bg was in the 90s., Pt says this morning the device read 71, but her accucheck read 91.  Pt also reports that she stopped taking her glipizide  yesterday. Pt reports no symptoms of hypo/hyperglycemia at this time.   2. CALLER: Document the source of call. (e.g., laboratory, patient).     Patient  Protocols used: PCP Call - No Triage-A-AH

## 2024-04-02 NOTE — Telephone Encounter (Addendum)
 FYI Only or Action Required?: Action required by provider: clinical question for provider.  Patient was last seen in primary care on 03/28/2024 by Marylynn Verneita CROME, MD.  Called Nurse Triage reporting Device Malfunctioning.  Symptoms began yesterday.  Interventions attempted: Rest, hydration, or home remedies and Dietary changes.  Symptoms are: stable.  Triage Disposition: No disposition on file.  Patient/caregiver understands and will follow disposition?:     Copied from CRM [id number]. Topic: Clinical - Red Word Triage >> Apr 02, 2024 10:53 AM Corin V wrote: [facility] that prompted transfer to Nurse Triage: Patient just started using a [employer]. It has been going down to 59 at night. She will get food/drinks to get her sugar up and the meter will keep going off as dangerously low. She did a finger prick with an Accucheck and it was in the 90s. She is not having any symptoms she normally has with low blood sugar. Device is malfunctioning.   Additional Information  Commented on: All Negative - Call PCP Within 24 Hours    Patient needs instruction for [facility] Device to ensure accurate maintenance of BG level  Answer Assessment - Initial Assessment Questions 1. REASON FOR CALL or QUESTION: What is your reason for calling today? or How can I best help you? or What question do you have that I can help answer?     Patient concerned that her Freestyle Herlene is malfunctioning. Patient says that last night she got a critical alert of 79, howwer she had no symptoms. Pt says she did a finger prick and her Bg was in the 90s., Pt says this morning the device read 71, but her accucheck read 91.  Pt also reports that she stopped taking her glipizide  yesterday. Pt reports no symptoms of hypo/hyperglycemia at this time.   2. CALLER: Document the source of call. (e.g., laboratory, patient).     Patient  Protocols used: PCP Call - No Triage-A-AH

## 2024-04-03 ENCOUNTER — Encounter: Payer: Self-pay | Admitting: Internal Medicine

## 2024-04-03 NOTE — Telephone Encounter (Signed)
 LMTCB. Please relay Dr. Lula message below when pt returns the call. Libre sensor has been placed up front for pt to come by and pick up.   Yes, but confirm that she has changed her glipizide  dosing to lunchtime because at her last visit she was having recurrent lows in the middle of the night with evening (predinner) dosing of glipizide 

## 2024-04-03 NOTE — Telephone Encounter (Signed)
 Pt has picked up the [place] sensor

## 2024-04-03 NOTE — Telephone Encounter (Signed)
 See telephone encounter pt is aware and has already picked up the sensor

## 2024-04-03 NOTE — Telephone Encounter (Unsigned)
 Copied from CRM [id number]. Topic: General - Other >> Apr 03, 2024  2:57 PM Sandra Hendrix wrote: Reason for CRM: Patient made aware of provider notation and stated she stopped the glipiZIDE  (GLUCOTROL ) 5 MG tablet due to her meter not working properly. Patient stated that she will start back on the medication once she picks the device up.

## 2024-04-12 NOTE — Telephone Encounter (Signed)
 Patient was calling to make sure she did not mess up sensor. Advised that sometimes they malfunction. She does not need anything further at this point. She is going to call in 1 week to have sugars downloaded for Dr Marylynn to review

## 2024-04-12 NOTE — Telephone Encounter (Signed)
 Copied from CRM [id number]. Topic: General - Other >> Apr 11, 2024  5:02 PM Deleta RAMAN wrote: Reason for CRM: patient believes [place]ama call and left a message for her to call back about free style patch for arm. You can contact her at [phone]

## 2024-04-18 ENCOUNTER — Telehealth: Payer: Self-pay | Admitting: Internal Medicine

## 2024-04-18 ENCOUNTER — Other Ambulatory Visit: Payer: Self-pay | Admitting: Internal Medicine

## 2024-04-18 MED ORDER — FLUTICASONE PROPIONATE 50 MCG/ACT NA SUSP: 1.0000 | Freq: Every day | NASAL | 1 refills | Status: AC

## 2024-04-18 NOTE — Telephone Encounter (Signed)
 Refilled

## 2024-04-18 NOTE — Telephone Encounter (Unsigned)
 Copied from CRM [id number]. Topic: Clinical - Medication Refill >> Apr 18, 2024  3:10 PM Deaijah H wrote: Medication: fluticasone  (FLONASE ) 50 MCG/ACT nasal spray (only want 1 bottle until prescription from [facility] come)  Has the patient contacted their pharmacy? No (Agent: If no, request that the patient contact the pharmacy for the refill. If patient does not wish to contact the pharmacy document the reason why and proceed with request.) (Agent: If yes, when and what did the pharmacy advise?)  This is the patient's preferred pharmacy:  [facility] [address], [place] - [address] [place] [address] Phone: [phone] Fax: [phone]   Is this the correct pharmacy for this prescription? Yes If no, delete pharmacy and type the correct one.   Has the prescription been filled recently? No  Is the patient out of the medication? Yes  Has the patient been seen for an appointment in the last year OR does the patient have an upcoming appointment? Yes  Can we respond through MyChart? Yes  Agent: Please be advised that Rx refills may take up to 3 business days. We ask that you follow-up with your pharmacy.

## 2024-04-18 NOTE — Telephone Encounter (Signed)
 Prescription Request  04/18/2024  LOV: 03/28/2024  What is the name of the medication or equipment? fluticasone  (FLONASE ) 50 MCG/ACT nasal spray   Have you contacted your pharmacy to request a refill? No   Which pharmacy would you like this sent to?    [facility] [address], [place] - [address] [place] [address] Phone: [phone] Fax: [phone]  Patient notified that their request is being sent to the clinical staff for review and that they should receive a response within 2 business days.   Please advise at Mobile [phone] (mobile)

## 2024-04-18 NOTE — Telephone Encounter (Signed)
 I have placed in yellow results folder for review.

## 2024-04-19 ENCOUNTER — Encounter: Payer: Self-pay | Admitting: Internal Medicine

## 2024-04-23 ENCOUNTER — Other Ambulatory Visit: Payer: Self-pay | Admitting: Internal Medicine

## 2024-04-23 ENCOUNTER — Other Ambulatory Visit (HOSPITAL_COMMUNITY): Payer: Self-pay

## 2024-04-23 ENCOUNTER — Telehealth: Payer: Self-pay

## 2024-04-23 MED ORDER — FREESTYLE LIBRE 3 SENSOR MISC: 11 refills | Status: DC

## 2024-04-23 NOTE — Telephone Encounter (Signed)
 Pharmacy Patient Advocate Encounter   Received notification from Onbase that prior authorization for FreeStyle Libre 3 sensor is required/requested.   Insurance verification completed.   The patient is insured through [employer] .   Per test claim: PA required; PA submitted to above mentioned insurance via CoverMyMeds Key/confirmation #/EOC [id number] Status is pending

## 2024-04-23 NOTE — Telephone Encounter (Signed)
 Copied from CRM [id number]. Topic: Clinical - Medication Prior Auth >> Apr 23, 2024 12:20 PM Suzen RAMAN wrote: Reason for CRM: patient called requesting a prior auth be submitted for her Continuous Glucose Sensor (FREESTYLE LIBRE 3 SENSOR) MISC per patient pharmacy  [phone].

## 2024-04-23 NOTE — Telephone Encounter (Signed)
 Pharmacy Patient Advocate Encounter  Received notification from [employer] that Prior Authorization for FreeStyle Libre 3 sensor has been APPROVED from 09/27/23 to 09/25/24. Ran test claim, Copay is $0.00. This test claim was processed through [facility]- copay amounts may vary at other pharmacies due to pharmacy/plan contracts, or as the patient moves through the different stages of their insurance plan.     PA #/Case ID/Reference #: [id number]

## 2024-04-23 NOTE — Telephone Encounter (Signed)
 PA has already been started on today will await update from pharmacy team

## 2024-04-23 NOTE — Telephone Encounter (Signed)
 Pt is aware.

## 2024-04-24 ENCOUNTER — Encounter: Payer: Self-pay | Admitting: Internal Medicine

## 2024-05-02 DIAGNOSIS — E119 Type 2 diabetes mellitus without complications: Secondary | ICD-10-CM | POA: Diagnosis not present

## 2024-05-02 DIAGNOSIS — Z961 Presence of intraocular lens: Secondary | ICD-10-CM | POA: Diagnosis not present

## 2024-05-02 DIAGNOSIS — H11121 Conjunctival concretions, right eye: Secondary | ICD-10-CM | POA: Diagnosis not present

## 2024-05-02 DIAGNOSIS — Z7984 Long term (current) use of oral hypoglycemic drugs: Secondary | ICD-10-CM | POA: Diagnosis not present

## 2024-06-01 ENCOUNTER — Other Ambulatory Visit: Payer: Self-pay | Admitting: Internal Medicine

## 2024-06-06 DIAGNOSIS — I10 Essential (primary) hypertension: Secondary | ICD-10-CM | POA: Diagnosis not present

## 2024-06-06 DIAGNOSIS — E1122 Type 2 diabetes mellitus with diabetic chronic kidney disease: Secondary | ICD-10-CM | POA: Diagnosis not present

## 2024-06-06 DIAGNOSIS — N182 Chronic kidney disease, stage 2 (mild): Secondary | ICD-10-CM | POA: Diagnosis not present

## 2024-06-06 DIAGNOSIS — R600 Localized edema: Secondary | ICD-10-CM | POA: Diagnosis not present

## 2024-06-10 ENCOUNTER — Other Ambulatory Visit: Payer: Self-pay | Admitting: Internal Medicine

## 2024-06-10 NOTE — Telephone Encounter (Signed)
 Medication was discontinued on 06/28/2024. Is it okay to refuse refill request?

## 2024-06-21 DIAGNOSIS — G4733 Obstructive sleep apnea (adult) (pediatric): Secondary | ICD-10-CM | POA: Diagnosis not present

## 2024-06-24 ENCOUNTER — Other Ambulatory Visit: Payer: Self-pay | Admitting: Internal Medicine

## 2024-06-24 NOTE — Telephone Encounter (Unsigned)
 Copied from CRM #[id number]. Topic: Clinical - Medication Refill >> Jun 24, 2024  8:44 AM Suzen RAMAN wrote: Medication: glipiZIDE  (GLUCOTROL ) 2.5 MG tablet   Has the patient contacted their pharmacy? Yes  This is the patient's preferred pharmacy:  [facility] - [place], [place] - [address] [place] [address] Phone: [phone] Fax: [phone]   Is this the correct pharmacy for this prescription? Yes If no, delete pharmacy and type the correct one.   Has the prescription been filled recently? Yes  Is the patient out of the medication? No-No sure  Has the patient been seen for an appointment in the last year OR does the patient have an upcoming appointment? Yes  Can we respond through MyChart? Yes  Agent: Please be advised that Rx refills may take up to 3 business days. We ask that you follow-up with your pharmacy.

## 2024-06-25 MED ORDER — GLIPIZIDE 5 MG PO TABS: ORAL_TABLET | ORAL | 0 refills | Status: DC

## 2024-06-25 NOTE — Telephone Encounter (Unsigned)
 Copied from CRM [id number]. Topic: Clinical - Medication Question >> Jun 25, 2024 11:36 AM Revonda D wrote: Reason for CRM: Sandra Hendrix with [facility] pharmacy is wanting to make sure that the request for the glipiZIDE  (GLUCOTROL ) 5 MG tablet gets sent to the pharmacy for the pt. The medication request was approved but it doesn't list a pharmacy.

## 2024-06-25 NOTE — Telephone Encounter (Signed)
 Spoke with pt for clarification on how the glipizide . Pt stated that the is taking Glipizide  2.5 mg tablet once daily. Rx in chart is the 5 mg tablet. Pt needs the 2.5 mg tablet sent to her mail order but will also need a 1 week supply sent to her local pharmacy to get her through till her mail order arrives.

## 2024-06-25 NOTE — Telephone Encounter (Signed)
 LMTCB. Need to clarify with pt if she is taking the medication or not. If so what is the dose and how is she taking it.

## 2024-06-25 NOTE — Telephone Encounter (Unsigned)
 Copied from CRM [id number]. Topic: Clinical - Medication Question >> Jun 25, 2024 11:36 AM Revonda D wrote: Reason for CRM: Caron with [facility] pharmacy is wanting to make sure that the request for the glipiZIDE  (GLUCOTROL ) 5 MG tablet gets sent to the pharmacy for the pt. The medication request was approved but it doesn't list a pharmacy. >> Jun 25, 2024  2:23 PM [place] J wrote: Patient is returning a call from [place]. She wanted to let her know that she is taking glipiZIDE  (GLUCOTROL ) 2.5 MG once a day.   Patient was wondering if Dr. Marylynn would be willing to send her an extra week supply of medication before the week is over since her mail order takes 7 days to get to her. She would like a call from [place] once there is an update on the prescription.

## 2024-06-27 MED ORDER — GLIPIZIDE 5 MG PO TABS: ORAL_TABLET | ORAL | 0 refills | Status: DC

## 2024-06-27 MED ORDER — GLIPIZIDE 5 MG PO TABS: 2.5000 mg | ORAL_TABLET | Freq: Every day | ORAL | 3 refills | Status: DC

## 2024-06-27 NOTE — Addendum Note (Signed)
 Addended by: MARYLYNN VERNEITA CROME on: 06/27/2024 04:50 AM   Modules accepted: Orders

## 2024-07-04 DIAGNOSIS — H5203 Hypermetropia, bilateral: Secondary | ICD-10-CM | POA: Diagnosis not present

## 2024-07-04 DIAGNOSIS — H11121 Conjunctival concretions, right eye: Secondary | ICD-10-CM | POA: Diagnosis not present

## 2024-07-04 DIAGNOSIS — E119 Type 2 diabetes mellitus without complications: Secondary | ICD-10-CM | POA: Diagnosis not present

## 2024-07-04 DIAGNOSIS — H524 Presbyopia: Secondary | ICD-10-CM | POA: Diagnosis not present

## 2024-07-04 DIAGNOSIS — Z7984 Long term (current) use of oral hypoglycemic drugs: Secondary | ICD-10-CM | POA: Diagnosis not present

## 2024-07-04 DIAGNOSIS — H52223 Regular astigmatism, bilateral: Secondary | ICD-10-CM | POA: Diagnosis not present

## 2024-07-04 DIAGNOSIS — Z961 Presence of intraocular lens: Secondary | ICD-10-CM | POA: Diagnosis not present

## 2024-07-04 LAB — OPHTHALMOLOGY REPORT-SCANNED

## 2024-07-09 DIAGNOSIS — E1122 Type 2 diabetes mellitus with diabetic chronic kidney disease: Secondary | ICD-10-CM | POA: Diagnosis not present

## 2024-07-09 DIAGNOSIS — I509 Heart failure, unspecified: Secondary | ICD-10-CM | POA: Diagnosis not present

## 2024-07-09 DIAGNOSIS — I700 Atherosclerosis of aorta: Secondary | ICD-10-CM | POA: Diagnosis not present

## 2024-07-09 DIAGNOSIS — E1142 Type 2 diabetes mellitus with diabetic polyneuropathy: Secondary | ICD-10-CM | POA: Diagnosis not present

## 2024-07-09 DIAGNOSIS — I701 Atherosclerosis of renal artery: Secondary | ICD-10-CM | POA: Diagnosis not present

## 2024-07-09 DIAGNOSIS — J4489 Other specified chronic obstructive pulmonary disease: Secondary | ICD-10-CM | POA: Diagnosis not present

## 2024-07-09 DIAGNOSIS — I130 Hypertensive heart and chronic kidney disease with heart failure and stage 1 through stage 4 chronic kidney disease, or unspecified chronic kidney disease: Secondary | ICD-10-CM | POA: Diagnosis not present

## 2024-07-09 DIAGNOSIS — F325 Major depressive disorder, single episode, in full remission: Secondary | ICD-10-CM | POA: Diagnosis not present

## 2024-07-09 DIAGNOSIS — E1151 Type 2 diabetes mellitus with diabetic peripheral angiopathy without gangrene: Secondary | ICD-10-CM | POA: Diagnosis not present

## 2024-07-23 ENCOUNTER — Encounter: Payer: Self-pay | Admitting: Internal Medicine

## 2024-07-23 ENCOUNTER — Ambulatory Visit: Admitting: Internal Medicine

## 2024-07-23 VITALS — BP 120/68 | HR 81 | Ht 60.0 in | Wt 144.0 lb

## 2024-07-23 DIAGNOSIS — I129 Hypertensive chronic kidney disease with stage 1 through stage 4 chronic kidney disease, or unspecified chronic kidney disease: Secondary | ICD-10-CM | POA: Diagnosis not present

## 2024-07-23 DIAGNOSIS — Z800 Family history of malignant neoplasm of digestive organs: Secondary | ICD-10-CM

## 2024-07-23 DIAGNOSIS — J683 Other acute and subacute respiratory conditions due to chemicals, gases, fumes and vapors: Secondary | ICD-10-CM | POA: Diagnosis not present

## 2024-07-23 DIAGNOSIS — N1831 Chronic kidney disease, stage 3a: Secondary | ICD-10-CM

## 2024-07-23 DIAGNOSIS — E1122 Type 2 diabetes mellitus with diabetic chronic kidney disease: Secondary | ICD-10-CM

## 2024-07-23 DIAGNOSIS — K635 Polyp of colon: Secondary | ICD-10-CM

## 2024-07-23 DIAGNOSIS — E785 Hyperlipidemia, unspecified: Secondary | ICD-10-CM | POA: Diagnosis not present

## 2024-07-23 DIAGNOSIS — K2270 Barrett's esophagus without dysplasia: Secondary | ICD-10-CM

## 2024-07-23 DIAGNOSIS — E1169 Type 2 diabetes mellitus with other specified complication: Secondary | ICD-10-CM

## 2024-07-23 DIAGNOSIS — E663 Overweight: Secondary | ICD-10-CM

## 2024-07-23 MED ORDER — FREESTYLE LIBRE 3 PLUS SENSOR MISC: 11 refills | Status: AC

## 2024-07-23 MED ORDER — EMPAGLIFLOZIN 25 MG PO TABS: 25.0000 mg | ORAL_TABLET | Freq: Every day | ORAL | 5 refills | Status: AC

## 2024-07-23 MED ORDER — METFORMIN HCL ER 500 MG PO TB24: ORAL_TABLET | ORAL | 3 refills | Status: DC

## 2024-07-23 MED ORDER — VERAPAMIL HCL ER 120 MG PO CP24: 120.0000 mg | ORAL_CAPSULE | Freq: Every day | ORAL | 3 refills | Status: AC

## 2024-07-23 MED ORDER — LOSARTAN POTASSIUM 100 MG PO TABS: ORAL_TABLET | ORAL | 3 refills | Status: AC

## 2024-07-23 NOTE — Assessment & Plan Note (Addendum)
 Stable for over 4 years .  Taking Jardiance .  Losartan  stopped due to hypotension. .. She is avoiding nephrotoxic agents .  Continue semi annual follow up with  nephrology  Lab Results  Component Value Date   CREATININE 0.79 02/15/2024   Lab Results  Component Value Date   NA 139 02/15/2024   K 4.1 02/15/2024   CL 104 02/15/2024   CO2 29 02/15/2024

## 2024-07-23 NOTE — Progress Notes (Unsigned)
 Subjective:  Patient ID: Sandra Hendrix, female    DOB: Sep 06, 1946  Age: 78 y.o. MRN: [id number]  CC: The primary encounter diagnosis was Type 2 DM with CKD stage 3 and hypertension (HCC). Diagnoses of Hyperlipidemia associated with type 2 diabetes mellitus (HCC), Family history of colon cancer, and Stage 3a chronic kidney disease (HCC) were also pertinent to this visit.   [name] presents for  Chief Complaint  Patient presents with   Medical Management of Chronic Issues   GERD:  recent aggravation ,  now resolved since resumed pantoprazole .  Has intermittent dysphagia occurring rarely    Outpatient Medications Prior to Visit  Medication Sig Dispense Refill   acetaminophen  (TYLENOL ) 500 MG tablet Take 2 tablets (1,000 mg total) by mouth every 6 (six) hours. (Patient taking differently: Take 1,000 mg by mouth every 6 (six) hours as needed.) 65 tablet 1   albuterol  (PROVENTIL  HFA;VENTOLIN  HFA) 108 (90 BASE) MCG/ACT inhaler Inhale 2 puffs into the lungs every 6 (six) hours as needed for wheezing or shortness of breath.      Ascorbic Acid 500 MG CHEW Chew by mouth.     atorvastatin  (LIPITOR) 40 MG tablet TAKE 1/2 TABLET EVERY DAY  AT  6  PM 45 tablet 3   azelastine  (ASTELIN ) 0.1 % nasal spray Place 1 spray into both nostrils daily.     Biotin 5000 MCG TABS Take by mouth daily.     blood glucose meter kit and supplies Use to check blood sugar twice daily. ICD 10: E11.29 1 each 0   Cinnamon Bark POWD Take by mouth.     COLLAGEN PO Take by mouth daily. For improved fingernail strength     Continuous Glucose Sensor (FREESTYLE LIBRE 3 PLUS SENSOR) MISC by Does not apply route. Change sensor every 15 days.     docusate sodium  (COLACE) 100 MG capsule Take 1 capsule (100 mg total) by mouth 2 (two) times daily. 20 capsule 0   empagliflozin  (JARDIANCE ) 25 MG TABS tablet Take 1 tablet (25 mg total) by mouth daily before breakfast. 30 tablet 5   fluticasone  (FLONASE ) 50 MCG/ACT nasal  spray Place 1 spray into both nostrils daily. 48 g 1   fluticasone  (FLOVENT  DISKUS) 50 MCG/BLIST diskus inhaler Inhale 1 puff into the lungs 2 (two) times daily.     furosemide  (LASIX ) 20 MG tablet TAKE 1 TABLET (20 MG TOTAL) BY MOUTH 2 TIMES A WEEK. AS NEEDED FOR FLUID RETENTION 26 tablet 3   glipiZIDE  (GLUCOTROL ) 5 MG tablet 1/2 tablet before dinner as needed for 30 tablet 0   glucose blood (ACCU-CHEK GUIDE) test strip TEST BLOOD SUGAR TWICE DAILY 200 strip 10   Lancet Devices (ACCU-CHEK SOFTCLIX) lancets Use to test blood sugars 1 -2 times daily 1 each 3   levocetirizine (XYZAL) 5 MG tablet Take 1 tablet by mouth every evening.     losartan  (COZAAR ) 100 MG tablet TAKE 1 TABLET EVERY DAY (REPLACING LOSARTAN  HCT) 90 tablet 3   Magnesium 250 MG TABS Take 1 tablet by mouth daily.     meclizine  (ANTIVERT ) 25 MG tablet Take 1 tablet (25 mg total) by mouth 3 (three) times daily as needed for dizziness. 30 tablet 3   metFORMIN  (GLUCOPHAGE -XR) 500 MG 24 hr tablet TAKE 2 TABLETS EVERY DAY WITH BREAKFAST (DOSE INCREASE) 180 tablet 3   Multiple Vitamin (MULTIVITAMIN) tablet Take 1 tablet by mouth daily.     nitroGLYCERIN  (NITROSTAT ) 0.4 MG SL tablet Place 0.4  mg under the tongue.     pantoprazole  (PROTONIX ) 40 MG tablet Take 1 tablet (40 mg total) by mouth 2 (two) times daily. 180 tablet 3   potassium chloride  SA (KLOR-CON  M) 20 MEQ tablet TAKE 1 TABLET TWICE WEEKLY 26 tablet 3   Probiotic Product (PROBIOTIC-10) CAPS Take 1 capsule by mouth daily.      Semaglutide ,0.25 or 0.5MG /DOS, (OZEMPIC , 0.25 OR 0.5 MG/DOSE,) 2 MG/3ML SOPN INJECT 0.25 MG INTO THE SKIN ONCE A WEEK. 6 mL 3   sertraline  (ZOLOFT ) 50 MG tablet TAKE 1 TABLET EVERY DAY 90 tablet 3   verapamil  (VERELAN ) 120 MG 24 hr capsule TAKE 1 CAPSULE EVERY DAY 90 capsule 3   Vibegron  (GEMTESA ) 75 MG TABS Take 1 tablet (75 mg total) by mouth daily. 90 tablet 3   vitamin B-12 (CYANOCOBALAMIN) 1000 MCG tablet Take 1,000 mcg by mouth daily.     Zinc 50 MG  TABS Take by mouth daily.     Continuous Glucose Sensor (FREESTYLE LIBRE 3 SENSOR) MISC Place 1 sensor on the skin every 14 days. Use to check glucose continuously 2 each 11   glipiZIDE  (GLUCOTROL ) 5 MG tablet Take 0.5 tablets (2.5 mg total) by mouth daily before supper. 90 tablet 3   No facility-administered medications prior to visit.    Review of Systems;  Patient denies headache, fevers, malaise, unintentional weight loss, skin rash, eye pain, sinus congestion and sinus pain, sore throat, dysphagia,  hemoptysis , cough, dyspnea, wheezing, chest pain, palpitations, orthopnea, edema, abdominal pain, nausea, melena, diarrhea, constipation, flank pain, dysuria, hematuria, urinary  Frequency, nocturia, numbness, tingling, seizures,  Focal weakness, Loss of consciousness,  Tremor, insomnia, depression, anxiety, and suicidal ideation.      Objective:  BP 120/68   Pulse 81   Ht 5' (1.524 m)   Wt 144 lb (65.3 kg)   SpO2 96%   BMI 28.12 kg/m   BP Readings from Last 3 Encounters:  07/23/24 120/68  03/28/24 120/62  03/14/24 114/68    Wt Readings from Last 3 Encounters:  07/23/24 144 lb (65.3 kg)  03/28/24 137 lb 6.4 oz (62.3 kg)  03/14/24 137 lb 3.2 oz (62.2 kg)    Physical Exam  Lab Results  Component Value Date   HGBA1C 7.4 (H) 02/15/2024   HGBA1C 7.8 (H) 08/17/2023   HGBA1C 6.4 (A) 02/14/2023    Lab Results  Component Value Date   CREATININE 0.79 02/15/2024   CREATININE 0.81 08/17/2023   CREATININE 0.88 02/14/2023    Lab Results  Component Value Date   WBC 3.0 (L) 08/30/2022   HGB 13.2 08/30/2022   HCT 39.5 08/30/2022   PLT 301.0 08/30/2022   GLUCOSE 122 (H) 02/15/2024   CHOL 238 (H) 02/15/2024   TRIG 59.0 02/15/2024   HDL 131.80 02/15/2024   LDLDIRECT 73.0 02/15/2024   LDLCALC 95 02/15/2024   ALT 24 02/15/2024   ALT 24 02/15/2024   AST 26 02/15/2024   AST 26 02/15/2024   NA 139 02/15/2024   K 4.1 02/15/2024   CL 104 02/15/2024   CREATININE 0.79  02/15/2024   BUN 23 02/15/2024   CO2 29 02/15/2024   TSH 2.49 02/15/2024   INR 1.1 01/23/2009   HGBA1C 7.4 (H) 02/15/2024   MICROALBUR 1.1 03/28/2024    US  Venous Img Lower Unilateral Right (DVT) Result Date: 07/26/2023 CLINICAL DATA:  Right lower extremity pain for the past 2 weeks. History of uterine cancer. Evaluate for DVT. EXAM: RIGHT LOWER EXTREMITY VENOUS DOPPLER  ULTRASOUND TECHNIQUE: Gray-scale sonography with graded compression, as well as color Doppler and duplex ultrasound were performed to evaluate the lower extremity deep venous systems from the level of the common femoral vein and including the common femoral, femoral, profunda femoral, popliteal and calf veins including the posterior tibial, peroneal and gastrocnemius veins when visible. The superficial great saphenous vein was also interrogated. Spectral Doppler was utilized to evaluate flow at rest and with distal augmentation maneuvers in the common femoral, femoral and popliteal veins. COMPARISON:  None Available. FINDINGS: Contralateral Common Femoral Vein: Respiratory phasicity is normal and symmetric with the symptomatic side. No evidence of thrombus. Normal compressibility. Common Femoral Vein: No evidence of thrombus. Normal compressibility, respiratory phasicity and response to augmentation. Saphenofemoral Junction: No evidence of thrombus. Normal compressibility and flow on color Doppler imaging. Profunda Femoral Vein: No evidence of thrombus. Normal compressibility and flow on color Doppler imaging. Femoral Vein: No evidence of thrombus. Normal compressibility, respiratory phasicity and response to augmentation. Popliteal Vein: No evidence of thrombus. Normal compressibility, respiratory phasicity and response to augmentation. Calf Veins: No evidence of thrombus. Normal compressibility and flow on color Doppler imaging. Superficial Great Saphenous Vein: No evidence of thrombus. Normal compressibility. Other Findings:  None.  IMPRESSION: No evidence of DVT within the right lower extremity. Electronically Signed   By: Norleen Roulette M.D.   On: 07/26/2023 18:11    Assessment & Plan:  .Type 2 DM with CKD stage 3 and hypertension (HCC)  Hyperlipidemia associated with type 2 diabetes mellitus (HCC) Assessment & Plan: She has aortic atherosclerosis.  She is tolerating asa, atorvastatin  ; LFTs are normal. LDL goal is 70   Lab Results  Component Value Date   CHOL 238 (H) 02/15/2024   HDL 131.80 02/15/2024   LDLCALC 95 02/15/2024   LDLDIRECT 73.0 02/15/2024   TRIG 59.0 02/15/2024   CHOLHDL 2 02/15/2024   Lab Results  Component Value Date   ALT 24 02/15/2024   ALT 24 02/15/2024   AST 26 02/15/2024   AST 26 02/15/2024   ALKPHOS 109 02/15/2024   ALKPHOS 109 02/15/2024   BILITOT 0.6 02/15/2024   BILITOT 0.6 02/15/2024      Family history of colon cancer  Stage 3a chronic kidney disease (HCC) Assessment & Plan: Stable for over 4 years .  Taking Jardiance .  Losartan  stopped due to hypotension. .. She is avoiding nephrotoxic agents .  Continue semi annual follow up with  nephrology  Lab Results  Component Value Date   CREATININE 0.79 02/15/2024   Lab Results  Component Value Date   NA 139 02/15/2024   K 4.1 02/15/2024   CL 104 02/15/2024   CO2 29 02/15/2024         I spent 34 minutes on the day of this face to face encounter reviewing patient's  most recent visit with cardiology,  nephrology,  and neurology,  prior relevant surgical and non surgical procedures, recent  labs and imaging studies, counseling on weight management,  reviewing the assessment and plan with patient, and post visit ordering and reviewing of  diagnostics and therapeutics with patient  .   Follow-up: No follow-ups on file.   Verneita LITTIE Kettering, MD

## 2024-07-23 NOTE — Assessment & Plan Note (Signed)
 She has aortic atherosclerosis.  She is tolerating asa, atorvastatin  ; LFTs are normal. LDL goal is 70   Lab Results  Component Value Date   CHOL 238 (H) 02/15/2024   HDL 131.80 02/15/2024   LDLCALC 95 02/15/2024   LDLDIRECT 73.0 02/15/2024   TRIG 59.0 02/15/2024   CHOLHDL 2 02/15/2024   Lab Results  Component Value Date   ALT 24 02/15/2024   ALT 24 02/15/2024   AST 26 02/15/2024   AST 26 02/15/2024   ALKPHOS 109 02/15/2024   ALKPHOS 109 02/15/2024   BILITOT 0.6 02/15/2024   BILITOT 0.6 02/15/2024

## 2024-07-23 NOTE — Patient Instructions (Addendum)
 Stop the glipizide  ,  it is causing you to drop in the  morning   STOP THE METF0RMIN BECAUSE OF THE  LOOSE STOOLS AND WE WILL FOLLOW YOUR BLOOD SUGARS   Fruit is better than hashbrowns and grits   Remind me in 2 weeks to look at your blood sugar   Continue ozempic  0.5 mg weekly doe and jardiance  25 mg daily

## 2024-07-24 ENCOUNTER — Ambulatory Visit: Payer: Self-pay | Admitting: Internal Medicine

## 2024-07-24 DIAGNOSIS — J683 Other acute and subacute respiratory conditions due to chemicals, gases, fumes and vapors: Secondary | ICD-10-CM | POA: Insufficient documentation

## 2024-07-24 LAB — COMPREHENSIVE METABOLIC PANEL WITH GFR
ALT: 22 U/L (ref 0–35)
AST: 22 U/L (ref 0–37)
Albumin: 4.5 g/dL (ref 3.5–5.2)
Alkaline Phosphatase: 113 U/L (ref 39–117)
BUN: 22 mg/dL (ref 6–23)
CO2: 27 meq/L (ref 19–32)
Calcium: 9.4 mg/dL (ref 8.4–10.5)
Chloride: 106 meq/L (ref 96–112)
Creatinine, Ser: 1.03 mg/dL (ref 0.40–1.20)
GFR: 51.43 mL/min — ABNORMAL LOW (ref >60.00)
Glucose, Bld: 90 mg/dL (ref 70–99)
Potassium: 4.2 meq/L (ref 3.5–5.1)
Sodium: 142 meq/L (ref 135–145)
Total Bilirubin: 0.5 mg/dL (ref 0.2–1.2)
Total Protein: 6.9 g/dL (ref 6.0–8.3)

## 2024-07-24 LAB — LIPID PANEL
Cholesterol: 236 mg/dL — ABNORMAL HIGH (ref 0–200)
HDL: 131.7 mg/dL (ref >39.00)
LDL Cholesterol: 89 mg/dL (ref 0–99)
NonHDL: 104.79
Total CHOL/HDL Ratio: 2
Triglycerides: 81 mg/dL (ref 0.0–149.0)
VLDL: 16.2 mg/dL (ref 0.0–40.0)

## 2024-07-24 LAB — HEMOGLOBIN A1C: Hgb A1c MFr Bld: 6.9 % — ABNORMAL HIGH (ref 4.6–6.5)

## 2024-07-24 LAB — LDL CHOLESTEROL, DIRECT: Direct LDL: 71 mg/dL

## 2024-07-24 NOTE — Assessment & Plan Note (Addendum)
 Stopping glipizde and metformin  today due to 1) recurrent hypoglycemia and 2) fecal urgency /incontinence due to chronically loose stools.  Continue jardiance  25 mg and ozemic 0.5 mg weekly. Labs are pending  Lab Results  Component Value Date   HGBA1C 7.4 (H) 02/15/2024

## 2024-07-24 NOTE — Assessment & Plan Note (Addendum)
 She underwent PFTs on 11 March 2021 that were essentially normal with the exception of some mild small airways component.  She was prescribed Flovent  and prn albuterol  and advised to continue anti reflux measures given presence of hiatal hernia.  She remains asymptomatic at rest and with exercise

## 2024-07-24 NOTE — Assessment & Plan Note (Signed)
 Non dysplastic.  Follow up advised in 2028

## 2024-07-24 NOTE — Assessment & Plan Note (Signed)
 Resolved by 2015.  Last EGD done in 2022 noted gastritis,  H pylori negative, and a Hiatal hernia.   Reminded to continue daily use of PPI  in the morning upon waking

## 2024-07-24 NOTE — Assessment & Plan Note (Signed)
 She had excessive weight loss with ozempic  ,  but diabetes became poorly controlled without the medication due to her indulges .  She has resumed ozempic  at a lower dose and is happy at her current BMI and does not want to lose more.  Continue 0.5 mg weekly

## 2024-08-01 ENCOUNTER — Ambulatory Visit

## 2024-08-01 DIAGNOSIS — N183 Chronic kidney disease, stage 3 unspecified: Secondary | ICD-10-CM | POA: Diagnosis not present

## 2024-08-01 DIAGNOSIS — E1122 Type 2 diabetes mellitus with diabetic chronic kidney disease: Secondary | ICD-10-CM | POA: Diagnosis not present

## 2024-08-01 DIAGNOSIS — I129 Hypertensive chronic kidney disease with stage 1 through stage 4 chronic kidney disease, or unspecified chronic kidney disease: Secondary | ICD-10-CM

## 2024-08-01 NOTE — Progress Notes (Signed)
 Pt presented today to have her libre sensor checked against her glucometer and our glucometer because her libre sensor is giving her very high readings. Our glucometer reading was 107, her glucometer reading was 111, and libre sensor reading was 302. New libre sensor was placed and pt was advised to keep a check on her readings with both the sensor and the glucometer and send us  a mychart message with the data.

## 2024-08-13 DIAGNOSIS — S161XXA Strain of muscle, fascia and tendon at neck level, initial encounter: Secondary | ICD-10-CM | POA: Diagnosis not present

## 2024-08-13 DIAGNOSIS — M19012 Primary osteoarthritis, left shoulder: Secondary | ICD-10-CM | POA: Diagnosis not present

## 2024-08-20 ENCOUNTER — Encounter: Payer: Self-pay | Admitting: Internal Medicine

## 2024-08-21 DIAGNOSIS — M25512 Pain in left shoulder: Secondary | ICD-10-CM | POA: Diagnosis not present

## 2024-08-21 DIAGNOSIS — M5412 Radiculopathy, cervical region: Secondary | ICD-10-CM | POA: Diagnosis not present

## 2024-08-26 ENCOUNTER — Other Ambulatory Visit: Payer: Self-pay | Admitting: Internal Medicine

## 2024-08-26 MED ORDER — GLIPIZIDE 2.5 MG PO TABS: 2.5000 mg | ORAL_TABLET | Freq: Every day | ORAL | 0 refills | Status: AC

## 2024-08-26 NOTE — Telephone Encounter (Signed)
I have printed and placed in yellow results folder.  

## 2024-09-05 ENCOUNTER — Other Ambulatory Visit (HOSPITAL_COMMUNITY): Payer: Self-pay

## 2024-09-25 ENCOUNTER — Other Ambulatory Visit: Payer: Self-pay

## 2024-09-25 MED ORDER — GEMTESA 75 MG PO TABS: 1.0000 | ORAL_TABLET | Freq: Every day | ORAL | 3 refills | Status: AC

## 2024-09-25 NOTE — Telephone Encounter (Signed)
 Last OV 07/23/24 Next OV 10/24/24 Medication previously prescribed by MacDiarmid, Scott, MD   Called pt and she is no longer seeing specialist and requests that medication be sent to PCP.  Prescription pended and sent to provider for approval.

## 2024-10-07 ENCOUNTER — Ambulatory Visit: Payer: Self-pay

## 2024-10-07 NOTE — Telephone Encounter (Signed)
 LMTCB

## 2024-10-07 NOTE — Telephone Encounter (Signed)
 FYI Only or Action Required?: Action required by provider: update on patient condition.  Patient was last seen in primary care on 07/23/2024 by Marylynn Verneita CROME, MD.  Called Nurse Triage reporting Headache and Nasal Congestion.  Symptoms began today.  Interventions attempted: Nothing.  Symptoms are: unchanged.  Triage Disposition: Home Care  Patient/caregiver understands and will follow disposition?: Yes    Copied from CRM #[id number]. Topic: Clinical - Red Word Triage >> Oct 07, 2024 10:15 AM Larissa RAMAN wrote: [facility] that prompted transfer to Nurse Triage: headaches, cough with phlegm    Reason for Disposition  Cough with cold symptoms (e.g., runny nose, postnasal drip, throat clearing)  Answer Assessment - Initial Assessment Questions Pt called to report cold vs. Flu symptoms. Pt states her husband was hospitalized for the flu, discharged last week. Pt states that she woke up this morning with productive cough with green phlegm. Pt reports she has had a h/a for the past couple days. Pt denies fever, no chest pain, no SOB. Pt has not tried any interventions and has not been swabbed for flu/covid at this time. Pt stated she was going to try tylenol  and mucinex, encouraged her to start regimen. Pt denies exposure to tobacco smoke, recommended increasing fluids and humidifier at nighttime. Pt states she helps take care of her friend who has MS so worried about being contagious. Encouraged her to take at home covid/flu test or go to pharmacy for testing. Discussed no appts with PCP but offered appt Wednesday with alternative provider, pt elects to do home care at this time.     1. ONSET: When did the cough begin?      Today   2. SEVERITY: How bad is the cough today?      Mild  3. SPUTUM: Describe the color of your sputum (e.g., none, dry cough; clear, white, yellow, green)     Yes; green   4. HEMOPTYSIS: Are you coughing up any blood? If Yes, ask: How much? (e.g., flecks,  streaks, tablespoons, etc.)     No   5. DIFFICULTY BREATHING: Are you having difficulty breathing? If Yes, ask: How bad is it? (e.g., mild, moderate, severe)      No   6. FEVER: Do you have a fever? If Yes, ask: What is your temperature, how was it measured, and when did it start?     No   10. OTHER SYMPTOMS: Do you have any other symptoms? (e.g., runny nose, wheezing, chest pain)       Runny nose; congestion, h/a  Protocols used: Cough - Acute Productive-A-AH

## 2024-10-07 NOTE — Telephone Encounter (Signed)
 Answer Assessment - Initial Assessment Questions Pt called to report cold vs. Flu symptoms. Pt states her husband was hospitalized for the flu, discharged last week. Pt states that she woke up this morning with productive cough with green phlegm. Pt reports she has had a h/a for the past couple days. Pt denies fever, no chest pain, no SOB. Pt has not tried any interventions and has not been swabbed for flu/covid at this time. Pt stated she was going to try tylenol  and mucinex, encouraged her to start regimen. Pt denies exposure to tobacco smoke, recommended increasing fluids and humidifier at nighttime. Pt states she helps take care of her friend who has MS so worried about being contagious. Encouraged her to take at home covid/flu test or go to pharmacy for testing. Discussed no appts with PCP but offered appt Wednesday with alternative provider, pt elects to do home care at this time.

## 2024-10-07 NOTE — Telephone Encounter (Signed)
 Pt returning missed call from CMA at the office. Was asked to call back, unsure the reason. Has an appt tomorrow at [facility]. Wanting to know if she has the flu and if they could do a test. Advised to keep appt tomorrow. No further needs.

## 2024-10-08 ENCOUNTER — Encounter: Payer: Self-pay | Admitting: Family Medicine

## 2024-10-08 ENCOUNTER — Ambulatory Visit: Admitting: Family Medicine

## 2024-10-08 VITALS — BP 110/76 | HR 82 | Temp 98.3°F | Ht 60.0 in | Wt 136.0 lb

## 2024-10-08 DIAGNOSIS — R051 Acute cough: Secondary | ICD-10-CM

## 2024-10-08 DIAGNOSIS — J069 Acute upper respiratory infection, unspecified: Secondary | ICD-10-CM | POA: Diagnosis not present

## 2024-10-08 LAB — POCT INFLUENZA A/B
Influenza A, POC: NEGATIVE
Influenza B, POC: NEGATIVE

## 2024-10-08 MED ORDER — BENZONATATE 200 MG PO CAPS: 200.0000 mg | ORAL_CAPSULE | Freq: Three times a day (TID) | ORAL | 1 refills | Status: AC | PRN

## 2024-10-08 NOTE — Assessment & Plan Note (Signed)
 S/p exp fo flu (husband)  Flu and covid tests negative today Also no fever and reassuring exam  Disc symptomatic care - see instructions on [facility] Sent in tessalon  perales for cough also  Update if not starting to improve in a week or if worsening  Call back and Er precautions noted in detail today   Will watch for wheezing

## 2024-10-08 NOTE — Patient Instructions (Addendum)
 Keep using your flovent  and flonase  Albuterol  as needed   If wheezing worsens please call    Drink fluids and rest  mucinex DM is good for cough and congestion  Nasal saline for congestion as needed  Tylenol  for fever or pain or headache  Please alert us  if symptoms worsen (if severe or short of breath please go to the ER)   Try the generic tessalon  for cough also   Update if not starting to improve in a week or if worsening

## 2024-10-08 NOTE — Progress Notes (Signed)
 "  Subjective:    Patient ID: Sandra Hendrix, female    DOB: [birth year], 79 y.o.   MRN: [id number]  HPI  Wt Readings from Last 3 Encounters:  10/08/24 136 lb (61.7 kg)  07/23/24 144 lb (65.3 kg)  03/28/24 137 lb 6.4 oz (62.3 kg)   26.56 kg/m  Vitals:   10/08/24 1205  BP: 110/76  Pulse: 82  Temp: 98.3 F (36.8 C)  SpO2: 94%    79 yo pt of Dr Marylynn presents for  Congestion  Cough   Husband had flu -ended up on hospital/pna   Started last week Prod cough  Sunday after church-felt worse  Sneezing Phlegm - green in color  No fever  No wheezing  No shortness of breath  Ears Throat- little sore  Nose is dry and sore on right (uses cpap)   Was exp to the flu   PMH notable for  Cough variant asthma /reactive airways  Has albuterol    Over the counter  Mucinex DM  Flonase   Flovent  bid  Has not needed her albuterol  mdi   Results for orders placed or performed in visit on 10/08/24  POCT Influenza A/B   Collection Time: 10/08/24 12:20 PM  Result Value Ref Range   Influenza A, POC Negative Negative   Influenza B, POC Negative Negative      Patient Active Problem List   Diagnosis Date Noted   Viral URI with cough 10/08/2024   Reactive airways dysfunction syndrome (HCC) 07/24/2024   Family history of colon cancer 07/23/2024   Anterior leg pain, unspecified laterality 02/15/2023   Inflamed external hemorrhoid 09/29/2022   Ulcer of genital labia 09/28/2022   Aortic atherosclerosis 03/09/2022   Thoracic arthritis 03/09/2022   Chronic rhinitis 02/07/2022   Cough variant asthma 02/07/2022   Mild intermittent asthma 02/07/2022   Vasomotor rhinitis 02/07/2022   Serrated polyp of colon 12/20/2021   Atypical chest pain 12/15/2020   Forgetfulness 10/23/2020   Chronic pain of right ankle 09/08/2020   Renal artery stenosis 07/01/2020   Left wrist sprain, sequela 02/24/2020   Right hand pain 02/24/2020   Hepatic steatosis 07/18/2019   Precordial pain  07/03/2019   Pain in right knee 10/31/2018   Chronic hip pain, left 09/29/2018   Hyperlipidemia associated with type 2 diabetes mellitus (HCC) 03/29/2018   Bilateral pes planus 03/29/2018   SUI (stress urinary incontinence, female) 11/23/2017   Vaginal atrophy 11/23/2017   Overweight (BMI 25.0-29.9) 06/20/2017   Carpal tunnel syndrome on right 06/20/2017   OSA on CPAP 11/24/2016   Cervical stenosis (uterine cervix) 11/16/2016   CKD (chronic kidney disease) stage 3, GFR 30-59 ml/min (HCC) 11/07/2014   GERD (gastroesophageal reflux disease) 12/26/2013   Irritable bowel syndrome with constipation 12/26/2013   Encounter for preventive health examination 10/21/2013   Chronic venous insufficiency 03/21/2013   Type 2 DM with CKD stage 3 and hypertension (HCC) 11/05/2012   Lack of libido 11/05/2012   Barrett's esophagus 11/05/2012   Esophageal hiatal hernia 11/05/2012   Essential hypertension 11/05/2012   Past Medical History:  Diagnosis Date   Allergy    Anxiety    Arthritis    Barrett's esophagus determined by endoscopy    Cancer (HCC) 2018   Uterine Cancer   Chronic kidney disease    Chronic venous insufficiency    Cough variant asthma 02/07/2022   COVID-19 virus infection 07/21/2021   NOTIFIED ON OCT 26 . FEVERS BODY ACHES,  HEADACHES.  SENDING IN [facility]   Depression    Diabetes mellitus without complication (HCC)    Difficult intubation    not sure what happened,asleep was told had problem with intubation   Diverticulosis    Endometrioid adenocarcinoma of uterus (HCC) 01/19/2017   Diagnosed in 2018  S/p TLH/BSO, XRT        Fatty liver    GERD (gastroesophageal reflux disease)    History of bronchitis    History of hiatal hernia    Hyperlipidemia    Hypertension    Irritable bowel syndrome with constipation    Sleep apnea    Use C- PAP   Urinary incontinence    Varicose veins    Past Surgical History:  Procedure Laterality Date   ABDOMINAL HYSTERECTOMY   2018   Cancer of the Uterus   CARDIAC CATHETERIZATION     CHOLECYSTECTOMY     COLONOSCOPY WITH PROPOFOL  N/A 06/14/2016   Procedure: COLONOSCOPY WITH PROPOFOL ;  Surgeon: Gladis RAYMOND Mariner, MD;  Location: [facility] ENDOSCOPY;  Service: Endoscopy;  Laterality: N/A;   COLONOSCOPY WITH PROPOFOL  N/A 12/18/2020   Procedure: COLONOSCOPY WITH PROPOFOL ;  Surgeon: Maryruth Ole DASEN, MD;  Location: [facility] ENDOSCOPY;  Service: Endoscopy;  Laterality: N/A;   COLONOSCOPY WITH PROPOFOL  N/A 12/17/2021   Procedure: COLONOSCOPY WITH PROPOFOL ;  Surgeon: Onita Elspeth Sharper, DO;  Location: [facility] ENDOSCOPY;  Service: Gastroenterology;  Laterality: N/A;  DM   ESOPHAGOGASTRODUODENOSCOPY N/A 12/18/2020   Procedure: ESOPHAGOGASTRODUODENOSCOPY (EGD);  Surgeon: Maryruth Ole DASEN, MD;  Location: [facility] ENDOSCOPY;  Service: Endoscopy;  Laterality: N/A;   ESOPHAGOGASTRODUODENOSCOPY (EGD) WITH PROPOFOL  N/A 06/14/2016   Procedure: ESOPHAGOGASTRODUODENOSCOPY (EGD) WITH PROPOFOL ;  Surgeon: Gladis RAYMOND Mariner, MD;  Location: [facility] ENDOSCOPY;  Service: Endoscopy;  Laterality: N/A;   ESOPHAGOGASTRODUODENOSCOPY (EGD) WITH PROPOFOL  N/A 06/12/2017   Procedure: ESOPHAGOGASTRODUODENOSCOPY (EGD) WITH PROPOFOL ;  Surgeon: Mariner Gladis RAYMOND, MD;  Location[facility] ENDOSCOPY;  Service: Endoscopy;  Laterality: N/A;   ESOPHAGOGASTRODUODENOSCOPY (EGD) WITH PROPOFOL  N/A 08/25/2017   Procedure: ESOPHAGOGASTRODUODENOSCOPY (EGD) WITH PROPOFOL ;  Surgeon: Mariner Gladis RAYMOND, MD;  Location: [facility] ENDOSCOPY;  Service: Endoscopy;  Laterality: N/A;   HYSTEROSCOPY WITH D & C N/A 01/02/2017   Procedure: DILATATION AND CURETTAGE /HYSTEROSCOPY;  Surgeon: Gladis DELENA Dollar, MD;  Location: [facility] ORS;  Service: Gynecology;  Laterality: N/A;   JOINT REPLACEMENT Right 2007   Total Knee Replacement   JOINT REPLACEMENT Left 2004   Total Knee Replacement   LAPAROSCOPIC HYSTERECTOMY Bilateral 03/01/2017   Procedure: HYSTERECTOMY TOTAL LAPAROSCOPIC BSO;  Surgeon:  Mancil Barter, MD;  Location: [facility] ORS;  Service: Gynecology;  Laterality: Bilateral;   ROTATOR CUFF REPAIR Right    SENTINEL NODE BIOPSY N/A 03/01/2017   Procedure: SENTINEL NODE INJECTION AND BIOPSY;  Surgeon: Mancil Barter, MD;  Location: [facility] ORS;  Service: Gynecology;  Laterality: N/A;   TONSILLECTOMY     as a child   TUBAL LIGATION     UPPER GI ENDOSCOPY     x2   Social History[1] Family History  Problem Relation Age of Onset   Stroke Mother    Hypertension Mother    Lupus Mother    Alcohol abuse Father    Diabetes Father    Cancer Father        gallbladder ca into liver    Arthritis Sister    Diabetes Sister    Lumbar disc disease Sister    Hyperlipidemia Daughter    Hypertension Daughter    Cancer Maternal Aunt        ovarian ca   Diabetes Paternal  Aunt    Drug abuse Paternal Aunt    Cancer Maternal Grandfather        colon ca   Hypertension Brother    Alcohol abuse Brother    Stroke Brother    Varicose Veins Maternal Grandmother    Alcohol abuse Brother    Hypertension Brother    Arthritis Sister    Depression Sister    Diabetes Sister    Hyperlipidemia Sister    Hypertension Sister    Miscarriages / Stillbirths Sister    Cancer Maternal Aunt    Cancer Maternal Aunt    Diabetes Paternal Aunt    Drug abuse Paternal Aunt    Hyperlipidemia Daughter    Hypertension Daughter    Mental illness Maternal Uncle    Allergies[2] Medications Ordered Prior to Encounter[3]  Review of Systems     Objective:   Physical Exam Constitutional:      General: She is not in acute distress.    Appearance: Normal appearance. She is well-developed and normal weight. She is not ill-appearing, toxic-appearing or diaphoretic.  HENT:     Head: Normocephalic and atraumatic.     Comments: Nares are injected and congested      Right Ear: Tympanic membrane, ear canal and external ear normal.     Left Ear: Tympanic membrane, ear canal and external ear normal.     Nose:  Congestion and rhinorrhea present.     Mouth/Throat:     Mouth: Mucous membranes are moist.     Pharynx: Oropharynx is clear. No oropharyngeal exudate or posterior oropharyngeal erythema.     Comments: Clear pnd  Eyes:     General:        Right eye: No discharge.        Left eye: No discharge.     Conjunctiva/sclera: Conjunctivae normal.     Pupils: Pupils are equal, round, and reactive to light.  Cardiovascular:     Rate and Rhythm: Normal rate.     Heart sounds: Normal heart sounds.  Pulmonary:     Effort: Pulmonary effort is normal. No respiratory distress.     Breath sounds: Normal breath sounds. No stridor. No rhonchi or rales.     Comments: Scant wheeze on forced exp only Chest:     Chest wall: No tenderness.  Musculoskeletal:     Cervical back: Normal range of motion and neck supple.  Lymphadenopathy:     Cervical: No cervical adenopathy.  Skin:    General: Skin is warm and dry.     Capillary Refill: Capillary refill takes less than 2 seconds.     Findings: No rash.  Neurological:     Mental Status: She is alert.     Cranial Nerves: No cranial nerve deficit.  Psychiatric:        Mood and Affect: Mood normal.           Assessment & Plan:   Problem List Items Addressed This Visit       Respiratory   Viral URI with cough - Primary   S/p exp fo flu (husband)  Flu and covid tests negative today Also no fever and reassuring exam  Disc symptomatic care - see instructions on AVS Sent in tessalon  perales for cough also  Update if not starting to improve in a week or if worsening  Call back and Er precautions noted in detail today   Will watch for wheezing      Other Visit Diagnoses  Acute cough       Relevant Orders   POCT Influenza A/B (Completed)         [1]  Social History Tobacco Use   Smoking status: Never    Passive exposure: Never   Smokeless tobacco: Never  Vaping Use   Vaping status: Never Used  Substance Use Topics   Alcohol  use: Not Currently    Comment: rare   Drug use: No  [2]  Allergies Allergen Reactions   Clindamycin/Lincomycin Rash   Codeine Rash   Lincomycin Rash   Morphine And Codeine Nausea Only   Talwin [Pentazocine] Nausea And Vomiting   Clindamycin Other (See Comments)   Morphine Other (See Comments)   Oxycodone  Other (See Comments)   Lisinopril  Cough    cough   Naloxone Other (See Comments)    Had issues with sedation and vomiting with EGD   Oxycodone -Acetaminophen  Other (See Comments)    Pt states this medication makes her bowels stop moving   Warfarin And Related Nausea Only  [3]  Current Outpatient Medications on File Prior to Visit  Medication Sig Dispense Refill   acetaminophen  (TYLENOL ) 500 MG tablet Take 2 tablets (1,000 mg total) by mouth every 6 (six) hours. (Patient taking differently: Take 1,000 mg by mouth every 6 (six) hours as needed.) 65 tablet 1   albuterol  (PROVENTIL  HFA;VENTOLIN  HFA) 108 (90 BASE) MCG/ACT inhaler Inhale 2 puffs into the lungs every 6 (six) hours as needed for wheezing or shortness of breath.      Ascorbic Acid 500 MG CHEW Chew by mouth.     atorvastatin  (LIPITOR) 40 MG tablet TAKE 1/2 TABLET EVERY DAY  AT  6  PM 45 tablet 3   azelastine  (ASTELIN ) 0.1 % nasal spray Place 1 spray into both nostrils daily.     Biotin 5000 MCG TABS Take by mouth daily.     blood glucose meter kit and supplies Use to check blood sugar twice daily. ICD 10: E11.29 1 each 0   Cinnamon Bark POWD Take by mouth.     COLLAGEN PO Take by mouth daily. For improved fingernail strength     Continuous Glucose Sensor (FREESTYLE LIBRE 3 PLUS SENSOR) MISC Change sensor every 15 days. 2 each 11   docusate sodium  (COLACE) 100 MG capsule Take 1 capsule (100 mg total) by mouth 2 (two) times daily. 20 capsule 0   empagliflozin  (JARDIANCE ) 25 MG TABS tablet Take 1 tablet (25 mg total) by mouth daily before breakfast. 30 tablet 5   fluticasone  (FLONASE ) 50 MCG/ACT nasal spray Place 1 spray into  both nostrils daily. 48 g 1   fluticasone  (FLOVENT  DISKUS) 50 MCG/BLIST diskus inhaler Inhale 1 puff into the lungs 2 (two) times daily.     furosemide  (LASIX ) 20 MG tablet TAKE 1 TABLET (20 MG TOTAL) BY MOUTH 2 TIMES A WEEK. AS NEEDED FOR FLUID RETENTION 26 tablet 3   glipiZIDE  2.5 MG TABS Take 2.5 mg by mouth daily after supper. 90 tablet 0   glucose blood (ACCU-CHEK GUIDE) test strip TEST BLOOD SUGAR TWICE DAILY 200 strip 10   Lancet Devices (ACCU-CHEK SOFTCLIX) lancets Use to test blood sugars 1 -2 times daily 1 each 3   levocetirizine (XYZAL) 5 MG tablet Take 1 tablet by mouth every evening.     losartan  (COZAAR ) 100 MG tablet TAKE 1 TABLET EVERY DAY (REPLACING LOSARTAN  HCT) 90 tablet 3   Magnesium 250 MG TABS Take 1 tablet by mouth daily.  meclizine  (ANTIVERT ) 25 MG tablet Take 1 tablet (25 mg total) by mouth 3 (three) times daily as needed for dizziness. 30 tablet 3   Multiple Vitamin (MULTIVITAMIN) tablet Take 1 tablet by mouth daily.     nitroGLYCERIN  (NITROSTAT ) 0.4 MG SL tablet Place 0.4 mg under the tongue.     pantoprazole  (PROTONIX ) 40 MG tablet Take 1 tablet (40 mg total) by mouth 2 (two) times daily. 180 tablet 3   potassium chloride  SA (KLOR-CON  M) 20 MEQ tablet TAKE 1 TABLET TWICE WEEKLY 26 tablet 3   Probiotic Product (PROBIOTIC-10) CAPS Take 1 capsule by mouth daily.      Semaglutide ,0.25 or 0.5MG /DOS, (OZEMPIC , 0.25 OR 0.5 MG/DOSE,) 2 MG/3ML SOPN INJECT 0.25 MG INTO THE SKIN ONCE A WEEK. 6 mL 3   sertraline  (ZOLOFT ) 50 MG tablet TAKE 1 TABLET EVERY DAY 90 tablet 3   verapamil  (VERELAN ) 120 MG 24 hr capsule Take 1 capsule (120 mg total) by mouth daily. 90 capsule 3   Vibegron  (GEMTESA ) 75 MG TABS Take 1 tablet (75 mg total) by mouth daily. 90 tablet 3   vitamin B-12 (CYANOCOBALAMIN ) 1000 MCG tablet Take 1,000 mcg by mouth daily.     Zinc 50 MG TABS Take by mouth daily.     No current facility-administered medications on file prior to visit.   "

## 2024-10-08 NOTE — Telephone Encounter (Signed)
 Pt was seen at [facility] today.

## 2024-10-24 ENCOUNTER — Encounter: Admitting: Internal Medicine

## 2024-10-31 ENCOUNTER — Ambulatory Visit: Payer: Self-pay | Admitting: Internal Medicine

## 2024-10-31 ENCOUNTER — Encounter: Admitting: Internal Medicine

## 2024-10-31 MED ORDER — FLUCONAZOLE 150 MG PO TABS: 150.0000 mg | ORAL_TABLET | Freq: Every day | ORAL | 0 refills | Status: AC

## 2024-10-31 NOTE — Telephone Encounter (Signed)
 1st attempt - left VM  Copied from CRM [id number]. Topic: Clinical - Medical Advice >> Oct 31, 2024  8:09 AM Sandra Hendrix wrote: Reason for CRM:  She has a yeast infection; she has had it before and has same symptoms. She is having irritation in vagina and she did take an antibiotic about 1 1/2 weeks ago. She is unable to come in due to weather and wants to know what is the best thing to do for it or can her doctor call in medication? Her number is [phone].

## 2024-10-31 NOTE — Telephone Encounter (Signed)
 LMTCB

## 2024-10-31 NOTE — Telephone Encounter (Signed)
 Spoke with pt and she stated she has had recent antibiotic use. About 1 week ago she developed what she thinks is a yeast infection. She stated that her symptoms have worsened over the last few days. Symptoms are pain, swelling and redness in the vaginal area. Pt would like to know if something could be called in.

## 2024-10-31 NOTE — Telephone Encounter (Signed)
 FYI Only or Action Required?: Action required by provider: clinical question for provider.  Patient was last seen in primary care on 10/08/2024 by Randeen Laine LABOR, MD.  Called Nurse Triage reporting Vaginitis.  Symptoms began several days ago.  Interventions attempted: OTC medications: hydrocortisone  cream.  Symptoms are: unchanged.  Triage Disposition: No disposition on file.  Patient/caregiver understands and will follow disposition?:   Reason for Disposition  [1] SEVERE pain AND [2] not improved 2 hours after pain medicine  Answer Assessment - Initial Assessment Questions No appts today for VV, pt unable to come in d/t weather. Has used hydrocortisone  cream with slight relief. Pt asking if something can be sent in for yeast infection.   1. SYMPTOM: What's the main symptom you're concerned about? (e.g., rash, itching, swelling, dryness)     Irritation and discomfort  2. LOCATION: Where is the  sx located? (e.g., inside/outside, left/right)     Externally  3. ONSET: When did the  sx  start?     3 days ago  4. PAIN: Is there any pain? If Yes, ask: How bad is it? (Scale: 0-10; none, mild, moderate, severe)     Yes mild  5. CAUSE: What do you think is causing the symptoms?     Recent abx for bronchitis 6. OTHER SYMPTOMS: Do you have any other symptoms? (e.g., fever, vaginal bleeding, pain with urination)     Irritation, redness, discomfort  Protocols used: Vulvar Symptoms-A-AH

## 2024-10-31 NOTE — Telephone Encounter (Signed)
Pt is aware that medication has been sent to pharmacy.  

## 2024-10-31 NOTE — Addendum Note (Signed)
 Addended by: MARYLYNN VERNEITA CROME on: 10/31/2024 02:58 PM   Modules accepted: Orders

## 2025-01-10 ENCOUNTER — Encounter: Admitting: Internal Medicine
